# Patient Record
Sex: Female | Born: 1935 | Race: White | Hispanic: No | Smoking: Never smoker
Health system: Southern US, Community
[De-identification: ages and names within clinical notes are randomized; demographics above are authoritative.]

## PROBLEM LIST (undated history)

## (undated) DIAGNOSIS — G459 Transient cerebral ischemic attack, unspecified: Secondary | ICD-10-CM

## (undated) DIAGNOSIS — Z955 Presence of coronary angioplasty implant and graft: Secondary | ICD-10-CM

## (undated) DIAGNOSIS — K219 Gastro-esophageal reflux disease without esophagitis: Secondary | ICD-10-CM

## (undated) DIAGNOSIS — R55 Syncope and collapse: Secondary | ICD-10-CM

## (undated) DIAGNOSIS — I779 Disorder of arteries and arterioles, unspecified: Secondary | ICD-10-CM

## (undated) DIAGNOSIS — N1831 Chronic kidney disease, stage 3a: Secondary | ICD-10-CM

## (undated) DIAGNOSIS — I219 Acute myocardial infarction, unspecified: Secondary | ICD-10-CM

## (undated) DIAGNOSIS — I700 Atherosclerosis of aorta: Secondary | ICD-10-CM

## (undated) DIAGNOSIS — N183 Chronic kidney disease, stage 3 unspecified: Secondary | ICD-10-CM

## (undated) DIAGNOSIS — A0472 Enterocolitis due to Clostridium difficile, not specified as recurrent: Secondary | ICD-10-CM

## (undated) DIAGNOSIS — I440 Atrioventricular block, first degree: Secondary | ICD-10-CM

## (undated) DIAGNOSIS — Z8679 Personal history of other diseases of the circulatory system: Secondary | ICD-10-CM

## (undated) DIAGNOSIS — I2510 Atherosclerotic heart disease of native coronary artery without angina pectoris: Secondary | ICD-10-CM

## (undated) DIAGNOSIS — D649 Anemia, unspecified: Secondary | ICD-10-CM

## (undated) DIAGNOSIS — K5792 Diverticulitis of intestine, part unspecified, without perforation or abscess without bleeding: Secondary | ICD-10-CM

## (undated) DIAGNOSIS — F419 Anxiety disorder, unspecified: Secondary | ICD-10-CM

## (undated) DIAGNOSIS — E039 Hypothyroidism, unspecified: Secondary | ICD-10-CM

## (undated) DIAGNOSIS — R42 Dizziness and giddiness: Secondary | ICD-10-CM

## (undated) DIAGNOSIS — I447 Left bundle-branch block, unspecified: Secondary | ICD-10-CM

## (undated) DIAGNOSIS — Z974 Presence of external hearing-aid: Secondary | ICD-10-CM

## (undated) DIAGNOSIS — F32A Depression, unspecified: Secondary | ICD-10-CM

## (undated) DIAGNOSIS — E785 Hyperlipidemia, unspecified: Secondary | ICD-10-CM

## (undated) DIAGNOSIS — I10 Essential (primary) hypertension: Secondary | ICD-10-CM

## (undated) HISTORY — PX: REPLACEMENT TOTAL KNEE BILATERAL: SUR1225

## (undated) HISTORY — PX: KNEE ARTHROSCOPY: SUR90

## (undated) HISTORY — DX: Enterocolitis due to Clostridium difficile, not specified as recurrent: A04.72

## (undated) HISTORY — PX: SHOULDER SURGERY: SHX246

---

## 2004-06-09 ENCOUNTER — Other Ambulatory Visit: Payer: Self-pay

## 2004-08-13 ENCOUNTER — Ambulatory Visit: Payer: Self-pay | Admitting: Family Medicine

## 2004-09-08 ENCOUNTER — Ambulatory Visit: Payer: Self-pay | Admitting: Family Medicine

## 2005-03-02 ENCOUNTER — Encounter: Payer: Self-pay | Admitting: General Practice

## 2005-03-04 ENCOUNTER — Encounter: Payer: Self-pay | Admitting: General Practice

## 2005-06-01 ENCOUNTER — Inpatient Hospital Stay: Payer: Self-pay | Admitting: Internal Medicine

## 2005-06-01 ENCOUNTER — Other Ambulatory Visit: Payer: Self-pay

## 2005-11-30 ENCOUNTER — Ambulatory Visit: Payer: Self-pay | Admitting: Family Medicine

## 2006-12-02 ENCOUNTER — Ambulatory Visit: Payer: Self-pay | Admitting: Family Medicine

## 2007-01-26 ENCOUNTER — Ambulatory Visit: Payer: Self-pay | Admitting: Internal Medicine

## 2007-01-30 ENCOUNTER — Ambulatory Visit: Payer: Self-pay | Admitting: Gastroenterology

## 2007-08-07 ENCOUNTER — Ambulatory Visit: Payer: Self-pay | Admitting: Family Medicine

## 2007-10-10 ENCOUNTER — Ambulatory Visit: Payer: Self-pay | Admitting: Gastroenterology

## 2008-01-30 ENCOUNTER — Ambulatory Visit: Payer: Self-pay | Admitting: Family Medicine

## 2008-04-04 LAB — HM PAP SMEAR: HM Pap smear: NORMAL

## 2008-04-10 ENCOUNTER — Ambulatory Visit: Payer: Self-pay | Admitting: Family Medicine

## 2008-04-10 LAB — HM DEXA SCAN

## 2008-11-14 ENCOUNTER — Emergency Department: Payer: Self-pay | Admitting: Emergency Medicine

## 2008-12-04 ENCOUNTER — Ambulatory Visit: Payer: Self-pay | Admitting: Family Medicine

## 2009-04-03 ENCOUNTER — Ambulatory Visit: Payer: Self-pay | Admitting: Family Medicine

## 2009-11-15 ENCOUNTER — Ambulatory Visit: Payer: Self-pay | Admitting: General Practice

## 2009-12-26 ENCOUNTER — Ambulatory Visit: Payer: Self-pay | Admitting: General Practice

## 2010-03-03 ENCOUNTER — Emergency Department: Payer: Self-pay | Admitting: Emergency Medicine

## 2010-06-15 ENCOUNTER — Ambulatory Visit: Payer: Self-pay | Admitting: Family Medicine

## 2010-06-15 LAB — HM MAMMOGRAPHY: HM Mammogram: NORMAL

## 2010-11-06 ENCOUNTER — Ambulatory Visit: Payer: Self-pay | Admitting: Family Medicine

## 2010-12-03 ENCOUNTER — Ambulatory Visit: Payer: Self-pay | Admitting: Family Medicine

## 2011-03-25 ENCOUNTER — Ambulatory Visit: Payer: Self-pay | Admitting: Gastroenterology
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
## 2011-09-21 ENCOUNTER — Ambulatory Visit: Payer: Self-pay | Admitting: Family Medicine

## 2011-10-17 LAB — COMPREHENSIVE METABOLIC PANEL
Albumin: 4 g/dL (ref 3.4–5.0)
Alkaline Phosphatase: 110 U/L (ref 50–136)
BUN: 49 mg/dL — ABNORMAL HIGH (ref 7–18)
Calcium, Total: 9 mg/dL (ref 8.5–10.1)
Chloride: 101 mmol/L (ref 98–107)
Co2: 23 mmol/L (ref 21–32)
Osmolality: 293 (ref 275–301)
Potassium: 3 mmol/L — ABNORMAL LOW (ref 3.5–5.1)
SGOT(AST): 47 U/L — ABNORMAL HIGH (ref 15–37)
SGPT (ALT): 69 U/L
Total Protein: 7.4 g/dL (ref 6.4–8.2)

## 2011-10-17 LAB — CBC
HGB: 14.9 g/dL (ref 12.0–16.0)
MCH: 30.9 pg (ref 26.0–34.0)
MCHC: 33.6 g/dL (ref 32.0–36.0)
Platelet: 244 10*3/uL (ref 150–440)
RDW: 13 % (ref 11.5–14.5)

## 2011-10-17 LAB — LIPASE, BLOOD: Lipase: 86 U/L (ref 73–393)

## 2011-10-18 ENCOUNTER — Inpatient Hospital Stay: Payer: Self-pay | Admitting: Internal Medicine

## 2011-10-18 LAB — COMPREHENSIVE METABOLIC PANEL
Anion Gap: 11 (ref 7–16)
BUN: 40 mg/dL — ABNORMAL HIGH (ref 7–18)
Bilirubin,Total: 0.4 mg/dL (ref 0.2–1.0)
Creatinine: 1.36 mg/dL — ABNORMAL HIGH (ref 0.60–1.30)
EGFR (African American): 48 — ABNORMAL LOW
EGFR (Non-African Amer.): 40 — ABNORMAL LOW
Glucose: 93 mg/dL (ref 65–99)
SGPT (ALT): 54 U/L
Total Protein: 6.1 g/dL — ABNORMAL LOW (ref 6.4–8.2)

## 2011-10-18 LAB — OCCULT BLOOD X 1 CARD TO LAB, STOOL: Occult Blood, Feces: NEGATIVE

## 2011-10-18 LAB — URINALYSIS, COMPLETE
Bilirubin,UR: NEGATIVE
Ph: 5 (ref 4.5–8.0)
Protein: NEGATIVE
RBC,UR: 3 /HPF (ref 0–5)
Squamous Epithelial: 2
WBC UR: 37 /HPF (ref 0–5)

## 2011-10-19 LAB — COMPREHENSIVE METABOLIC PANEL
Alkaline Phosphatase: 75 U/L (ref 50–136)
Anion Gap: 13 (ref 7–16)
Bilirubin,Total: 0.3 mg/dL (ref 0.2–1.0)
Calcium, Total: 7.9 mg/dL — ABNORMAL LOW (ref 8.5–10.1)
Co2: 22 mmol/L (ref 21–32)
Creatinine: 1.06 mg/dL (ref 0.60–1.30)
EGFR (African American): >60
EGFR (Non-African Amer.): 53 — ABNORMAL LOW
Osmolality: 293 (ref 275–301)
SGOT(AST): 26 U/L (ref 15–37)
SGPT (ALT): 41 U/L
Sodium: 145 mmol/L (ref 136–145)
Total Protein: 5.6 g/dL — ABNORMAL LOW (ref 6.4–8.2)

## 2011-10-19 LAB — CBC WITH DIFFERENTIAL/PLATELET
Basophil %: 0.3 %
Eosinophil #: 0 10*3/uL (ref 0.0–0.7)
Eosinophil %: 0.7 %
HCT: 33.9 % — ABNORMAL LOW (ref 35.0–47.0)
HGB: 11.4 g/dL — ABNORMAL LOW (ref 12.0–16.0)
Lymphocyte #: 1.3 10*3/uL (ref 1.0–3.6)
Lymphocyte %: 28 %
MCH: 30.8 pg (ref 26.0–34.0)
MCV: 92 fL (ref 80–100)
Monocyte #: 0.2 10*3/uL (ref 0.0–0.7)
Neutrophil #: 3.1 10*3/uL (ref 1.4–6.5)
Neutrophil %: 67.3 %
Platelet: 166 10*3/uL (ref 150–440)
RBC: 3.7 10*6/uL — ABNORMAL LOW (ref 3.80–5.20)

## 2011-10-19 LAB — LIPID PANEL
Cholesterol: 120 mg/dL (ref 0–200)
HDL Cholesterol: 15 mg/dL — ABNORMAL LOW (ref 40–60)
Ldl Cholesterol, Calc: 75 mg/dL (ref 0–100)
Triglycerides: 152 mg/dL (ref 0–200)

## 2011-10-19 LAB — TSH: Thyroid Stimulating Horm: 1.4 u[IU]/mL

## 2011-10-19 LAB — HEMOGLOBIN A1C: Hemoglobin A1C: 5.5 % (ref 4.2–6.3)

## 2011-10-20 LAB — URINE CULTURE

## 2012-10-27 ENCOUNTER — Ambulatory Visit: Payer: Self-pay | Admitting: Family Medicine

## 2013-01-22 ENCOUNTER — Inpatient Hospital Stay: Payer: Self-pay | Admitting: Internal Medicine

## 2013-01-22 LAB — URINALYSIS, COMPLETE
Bacteria: NONE SEEN
Bilirubin,UR: NEGATIVE
Glucose,UR: NEGATIVE mg/dL (ref 0–75)
Ketone: NEGATIVE
Nitrite: NEGATIVE
Protein: NEGATIVE
RBC,UR: 6 /HPF (ref 0–5)
Squamous Epithelial: 8
WBC UR: 9 /HPF (ref 0–5)

## 2013-01-22 LAB — COMPREHENSIVE METABOLIC PANEL
Albumin: 2.8 g/dL — ABNORMAL LOW (ref 3.4–5.0)
Anion Gap: 8 (ref 7–16)
Calcium, Total: 8.7 mg/dL (ref 8.5–10.1)
Chloride: 97 mmol/L — ABNORMAL LOW (ref 98–107)
Co2: 27 mmol/L (ref 21–32)
EGFR (African American): 51 — ABNORMAL LOW
EGFR (Non-African Amer.): 44 — ABNORMAL LOW
Osmolality: 267 (ref 275–301)
Potassium: 2.7 mmol/L — ABNORMAL LOW (ref 3.5–5.1)
SGOT(AST): 28 U/L (ref 15–37)
SGPT (ALT): 31 U/L (ref 12–78)
Sodium: 132 mmol/L — ABNORMAL LOW (ref 136–145)

## 2013-01-22 LAB — CBC WITH DIFFERENTIAL/PLATELET
Eosinophil #: 0.2 10*3/uL (ref 0.0–0.7)
Eosinophil %: 0.8 %
HCT: 33.5 % — ABNORMAL LOW (ref 35.0–47.0)
HGB: 11.8 g/dL — ABNORMAL LOW (ref 12.0–16.0)
Lymphocyte #: 1.4 10*3/uL (ref 1.0–3.6)
Lymphocyte %: 7.5 %
MCH: 31.6 pg (ref 26.0–34.0)
MCV: 90 fL (ref 80–100)
Neutrophil %: 86.8 %
Platelet: 178 10*3/uL (ref 150–440)
RBC: 3.73 10*6/uL — ABNORMAL LOW (ref 3.80–5.20)
WBC: 19 10*3/uL — ABNORMAL HIGH (ref 3.6–11.0)

## 2013-01-23 LAB — CBC WITH DIFFERENTIAL/PLATELET
Basophil %: 0.2 %
Eosinophil %: 1.1 %
HGB: 11.8 g/dL — ABNORMAL LOW (ref 12.0–16.0)
Lymphocyte %: 8.7 %
MCH: 31.1 pg (ref 26.0–34.0)
MCHC: 34.4 g/dL (ref 32.0–36.0)
MCV: 90 fL (ref 80–100)
Neutrophil #: 15.1 10*3/uL — ABNORMAL HIGH (ref 1.4–6.5)
Neutrophil %: 84.4 %
Platelet: 181 10*3/uL (ref 150–440)
RBC: 3.8 10*6/uL (ref 3.80–5.20)
RDW: 14.7 % — ABNORMAL HIGH (ref 11.5–14.5)
WBC: 17.9 10*3/uL — ABNORMAL HIGH (ref 3.6–11.0)

## 2013-01-23 LAB — BASIC METABOLIC PANEL
Anion Gap: 5 — ABNORMAL LOW (ref 7–16)
BUN: 13 mg/dL (ref 7–18)
Calcium, Total: 8.5 mg/dL (ref 8.5–10.1)
Creatinine: 1.24 mg/dL (ref 0.60–1.30)
Glucose: 127 mg/dL — ABNORMAL HIGH (ref 65–99)
Osmolality: 266 (ref 275–301)
Potassium: 3 mmol/L — ABNORMAL LOW (ref 3.5–5.1)
Sodium: 132 mmol/L — ABNORMAL LOW (ref 136–145)

## 2013-01-24 LAB — BASIC METABOLIC PANEL
Anion Gap: 6 — ABNORMAL LOW (ref 7–16)
BUN: 10 mg/dL (ref 7–18)
Chloride: 105 mmol/L (ref 98–107)
Co2: 27 mmol/L (ref 21–32)
Creatinine: 0.92 mg/dL (ref 0.60–1.30)
EGFR (African American): >60
Potassium: 3.7 mmol/L (ref 3.5–5.1)
Sodium: 138 mmol/L (ref 136–145)

## 2013-01-24 LAB — CBC WITH DIFFERENTIAL/PLATELET
Basophil #: 0.1 10*3/uL (ref 0.0–0.1)
Basophil %: 0.9 %
Eosinophil #: 0.2 10*3/uL (ref 0.0–0.7)
Eosinophil %: 1.5 %
Lymphocyte #: 1.9 10*3/uL (ref 1.0–3.6)
Lymphocyte %: 11.6 %
MCH: 31.4 pg (ref 26.0–34.0)
MCV: 91 fL (ref 80–100)
Monocyte #: 0.6 x10 3/mm (ref 0.2–0.9)
Monocyte %: 3.7 %
Neutrophil #: 13.2 10*3/uL — ABNORMAL HIGH (ref 1.4–6.5)
Platelet: 212 10*3/uL (ref 150–440)
RBC: 3.91 10*6/uL (ref 3.80–5.20)
WBC: 16 10*3/uL — ABNORMAL HIGH (ref 3.6–11.0)

## 2013-01-24 LAB — URINE CULTURE

## 2013-01-25 LAB — CBC WITH DIFFERENTIAL/PLATELET
Basophil %: 1 %
Eosinophil %: 2.9 %
HCT: 35.8 % (ref 35.0–47.0)
HGB: 12 g/dL (ref 12.0–16.0)
Lymphocyte #: 1.8 10*3/uL (ref 1.0–3.6)
Lymphocyte %: 13.3 %
MCH: 30.6 pg (ref 26.0–34.0)
MCHC: 33.4 g/dL (ref 32.0–36.0)
Monocyte %: 4.6 %
Neutrophil #: 10.3 10*3/uL — ABNORMAL HIGH (ref 1.4–6.5)
Neutrophil %: 78.2 %
RBC: 3.91 10*6/uL (ref 3.80–5.20)
WBC: 13.2 10*3/uL — ABNORMAL HIGH (ref 3.6–11.0)

## 2013-01-28 LAB — CULTURE, BLOOD (SINGLE)

## 2013-02-21 ENCOUNTER — Ambulatory Visit: Payer: Self-pay | Admitting: Specialist

## 2013-12-26 ENCOUNTER — Ambulatory Visit: Payer: Self-pay | Admitting: Family Medicine

## 2014-04-09 IMAGING — CR DG CHEST 2V
1 series · 2 of 2 positions shown · non-contrast
Comparison: none

REASON FOR EXAM: cough
COMMENTS:

[Series 1: x chest ap · 0.14mm/px · 2 of 2 slices shown]
[im 1/2]
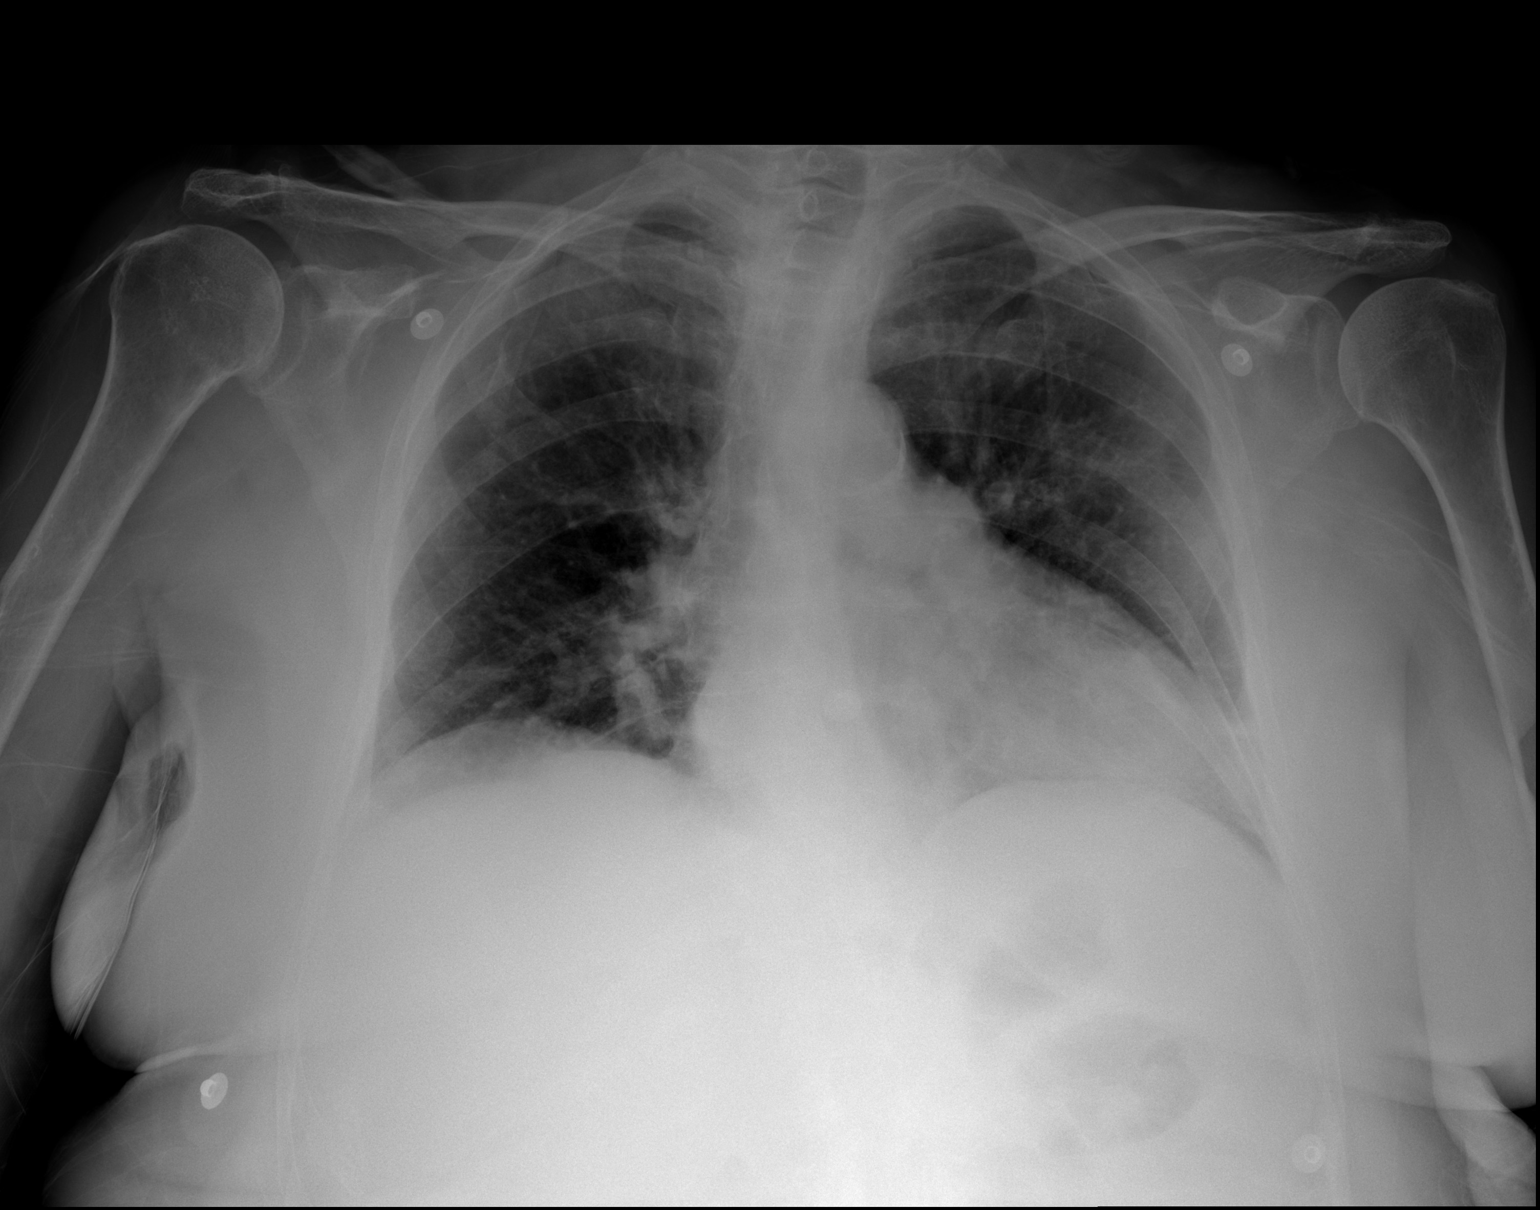
[im 2/2]
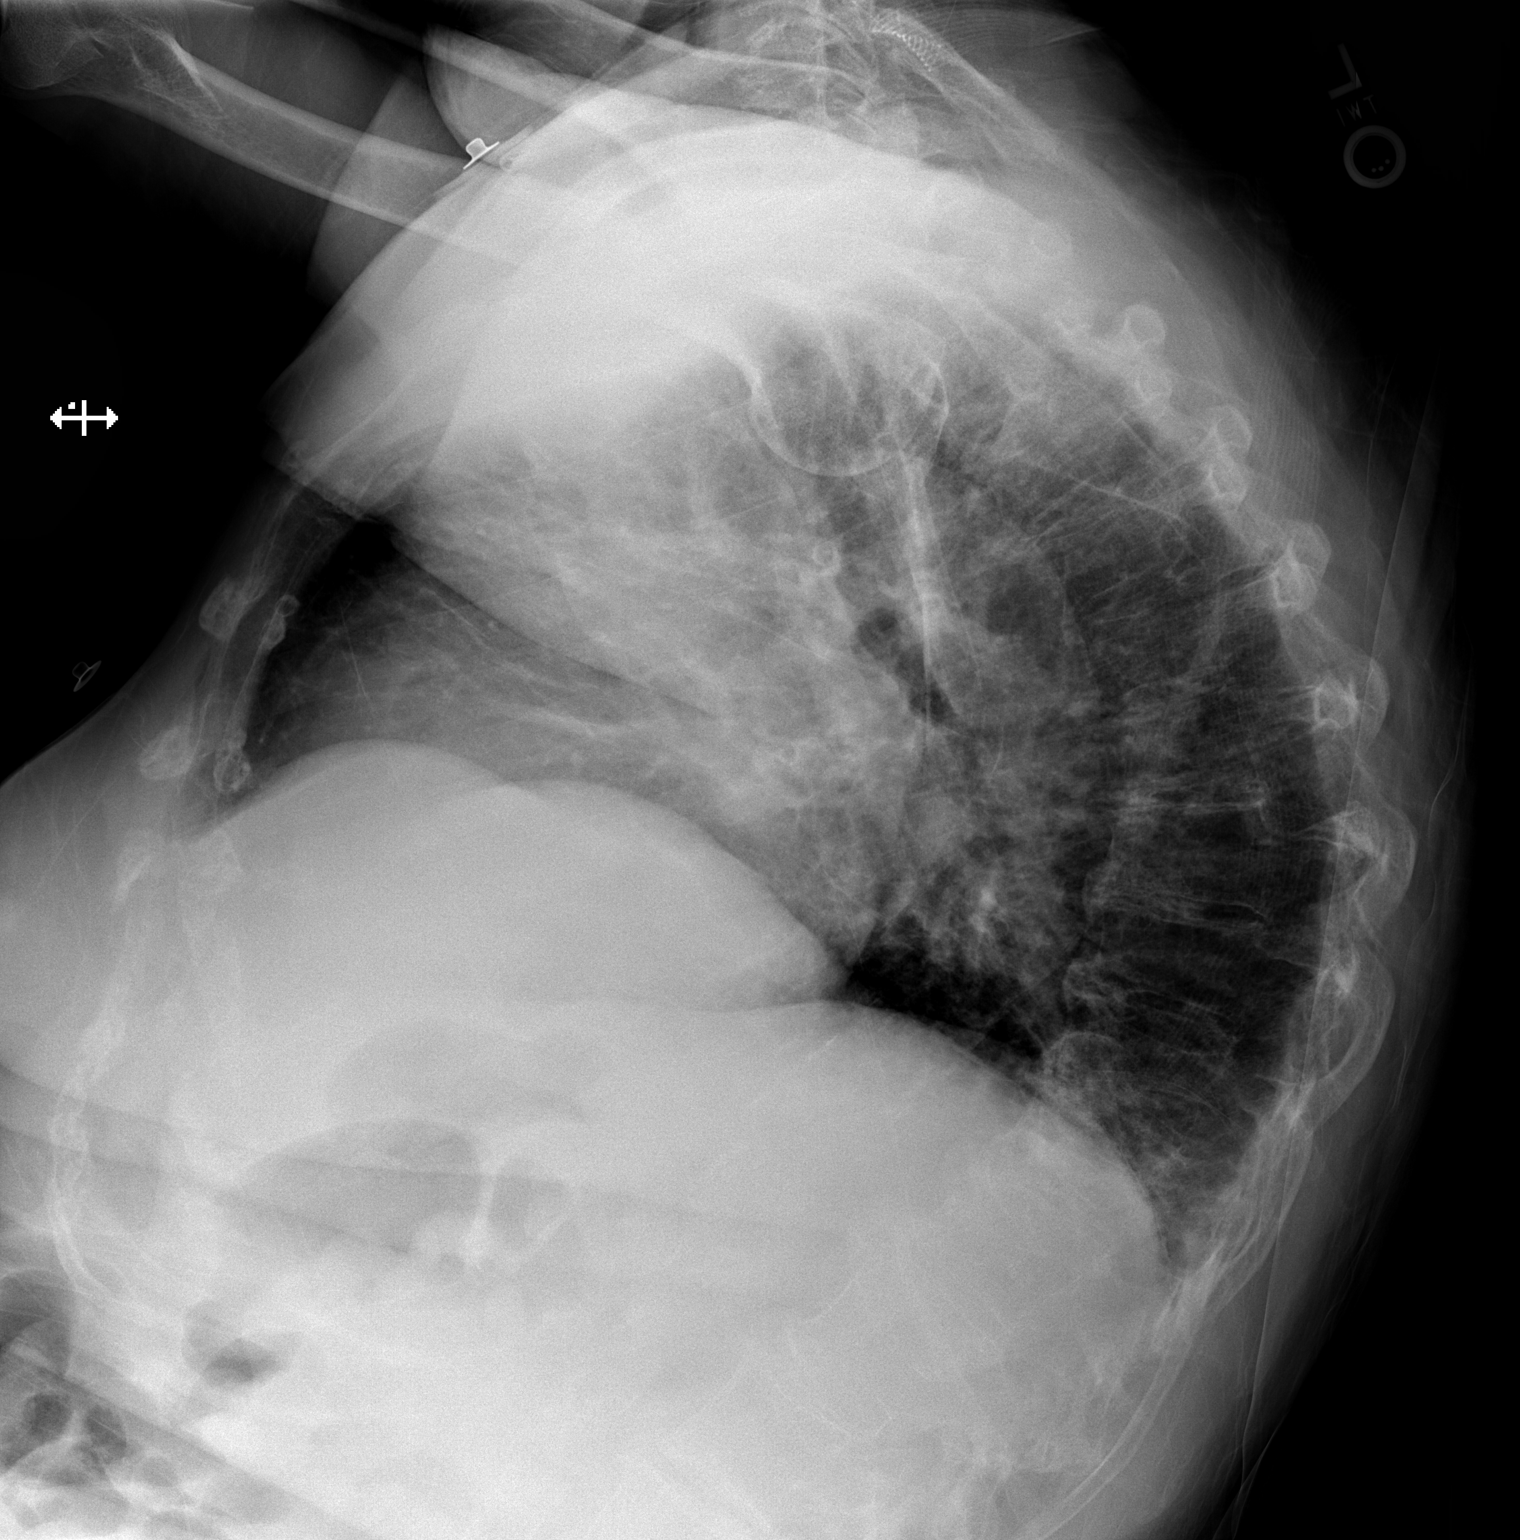

[2 of 2 positions shown; findings below may reference images not displayed]

PROCEDURE:     DXR - DXR CHEST PA (OR AP) AND LATERAL  - January 22, 2013 [DATE]

RESULT:     Comparison is made to prior study dated 10/27/2012.

The patient has taken a shallow inspiration. There is prominence of the
interstitial markings. No focal regions of consolidation or focal
infiltrates are appreciated.

The cardiac silhouette is moderately enlarged. The visualized bony skeleton
is unremarkable.
IMPRESSION: 1. Shallow inspiration.
2. Interstitial prominence which may represent an infectious or inflammatory
infiltrate. Surveillance evaluation status post appropriate regimen is
recommended.

## 2014-04-18 ENCOUNTER — Ambulatory Visit: Payer: Self-pay | Admitting: Gastroenterology

## 2014-04-18 LAB — HM COLONOSCOPY

## 2014-04-26 ENCOUNTER — Other Ambulatory Visit: Payer: Self-pay | Admitting: Urgent Care

## 2014-04-26 LAB — COMPREHENSIVE METABOLIC PANEL
ALK PHOS: 92 U/L
Albumin: 3.8 g/dL (ref 3.4–5.0)
Anion Gap: 7 (ref 7–16)
BUN: 19 mg/dL — AB (ref 7–18)
Bilirubin,Total: 0.5 mg/dL (ref 0.2–1.0)
CHLORIDE: 99 mmol/L (ref 98–107)
Calcium, Total: 8.8 mg/dL (ref 8.5–10.1)
Co2: 27 mmol/L (ref 21–32)
Creatinine: 1.2 mg/dL (ref 0.60–1.30)
EGFR (African American): 49 — ABNORMAL LOW
GFR CALC NON AF AMER: 42 — AB
Glucose: 89 mg/dL (ref 65–99)
Osmolality: 268 (ref 275–301)
Potassium: 3.9 mmol/L (ref 3.5–5.1)
SGOT(AST): 22 U/L (ref 15–37)
SGPT (ALT): 29 U/L
Sodium: 133 mmol/L — ABNORMAL LOW (ref 136–145)
Total Protein: 7.4 g/dL (ref 6.4–8.2)

## 2014-04-26 LAB — CBC WITH DIFFERENTIAL/PLATELET
BASOS ABS: 0.1 10*3/uL (ref 0.0–0.1)
Basophil %: 0.7 %
Eosinophil #: 0.2 10*3/uL (ref 0.0–0.7)
Eosinophil %: 1.9 %
HCT: 39 % (ref 35.0–47.0)
HGB: 13.6 g/dL (ref 12.0–16.0)
LYMPHS PCT: 30.4 %
Lymphocyte #: 2.5 10*3/uL (ref 1.0–3.6)
MCH: 31.2 pg (ref 26.0–34.0)
MCHC: 34.8 g/dL (ref 32.0–36.0)
MCV: 90 fL (ref 80–100)
MONO ABS: 0.6 x10 3/mm (ref 0.2–0.9)
MONOS PCT: 7.5 %
NEUTROS PCT: 59.5 %
Neutrophil #: 5 10*3/uL (ref 1.4–6.5)
Platelet: 169 10*3/uL (ref 150–440)
RBC: 4.36 10*6/uL (ref 3.80–5.20)
RDW: 14.3 % (ref 11.5–14.5)
WBC: 8.3 10*3/uL (ref 3.6–11.0)

## 2014-04-26 LAB — CLOSTRIDIUM DIFFICILE(ARMC)

## 2014-04-26 LAB — TSH: THYROID STIMULATING HORM: 6.77 u[IU]/mL — AB

## 2014-04-26 LAB — MAGNESIUM: MAGNESIUM: 1.8 mg/dL

## 2014-04-29 LAB — STOOL CULTURE

## 2014-07-12 DIAGNOSIS — I6529 Occlusion and stenosis of unspecified carotid artery: Secondary | ICD-10-CM | POA: Insufficient documentation

## 2014-10-04 DIAGNOSIS — I214 Non-ST elevation (NSTEMI) myocardial infarction: Secondary | ICD-10-CM

## 2014-10-04 HISTORY — DX: Non-ST elevation (NSTEMI) myocardial infarction: I21.4

## 2014-11-06 DIAGNOSIS — H40003 Preglaucoma, unspecified, bilateral: Secondary | ICD-10-CM | POA: Diagnosis not present

## 2014-11-08 DIAGNOSIS — H40003 Preglaucoma, unspecified, bilateral: Secondary | ICD-10-CM | POA: Diagnosis not present

## 2014-12-02 DIAGNOSIS — I872 Venous insufficiency (chronic) (peripheral): Secondary | ICD-10-CM | POA: Diagnosis not present

## 2014-12-02 DIAGNOSIS — I679 Cerebrovascular disease, unspecified: Secondary | ICD-10-CM | POA: Diagnosis not present

## 2014-12-02 DIAGNOSIS — I10 Essential (primary) hypertension: Secondary | ICD-10-CM | POA: Diagnosis not present

## 2014-12-02 DIAGNOSIS — K219 Gastro-esophageal reflux disease without esophagitis: Secondary | ICD-10-CM | POA: Diagnosis not present

## 2014-12-06 DIAGNOSIS — E780 Pure hypercholesterolemia: Secondary | ICD-10-CM | POA: Diagnosis not present

## 2014-12-06 DIAGNOSIS — I10 Essential (primary) hypertension: Secondary | ICD-10-CM | POA: Diagnosis not present

## 2014-12-06 LAB — LIPID PANEL
CHOLESTEROL: 204 mg/dL — AB (ref 0–200)
HDL: 31 mg/dL — AB (ref 35–70)
LDL Cholesterol: 125 mg/dL
LDl/HDL Ratio: 4
Triglycerides: 241 mg/dL — AB (ref 40–160)

## 2014-12-06 LAB — BASIC METABOLIC PANEL
BUN: 15 mg/dL (ref 4–21)
Creatinine: 1.1 mg/dL (ref <=1.1)
Glucose: 85 mg/dL
Potassium: 4.4 mmol/L (ref 3.4–5.3)
Sodium: 140 mmol/L (ref 137–147)

## 2014-12-06 LAB — CBC AND DIFFERENTIAL
HCT: 40 % (ref 36–46)
HEMOGLOBIN: 14 g/dL (ref 12.0–16.0)
Neutrophils Absolute: 55 /uL
Platelets: 180 10*3/uL (ref 150–399)
WBC: 7.9 10*3/mL

## 2014-12-06 LAB — HEPATIC FUNCTION PANEL
ALT: 19 U/L (ref 7–35)
AST: 25 U/L (ref 13–35)
Alkaline Phosphatase: 83 U/L (ref 25–125)
Bilirubin, Total: 0.4 mg/dL

## 2014-12-06 LAB — TSH: TSH: 0.84 u[IU]/mL (ref <=5.90)

## 2014-12-11 DIAGNOSIS — K551 Chronic vascular disorders of intestine: Secondary | ICD-10-CM | POA: Diagnosis not present

## 2014-12-20 DIAGNOSIS — I6521 Occlusion and stenosis of right carotid artery: Secondary | ICD-10-CM | POA: Diagnosis not present

## 2015-01-15 DIAGNOSIS — J301 Allergic rhinitis due to pollen: Secondary | ICD-10-CM | POA: Diagnosis not present

## 2015-01-15 DIAGNOSIS — H6063 Unspecified chronic otitis externa, bilateral: Secondary | ICD-10-CM | POA: Diagnosis not present

## 2015-01-15 DIAGNOSIS — H6123 Impacted cerumen, bilateral: Secondary | ICD-10-CM | POA: Diagnosis not present

## 2015-01-15 DIAGNOSIS — H1045 Other chronic allergic conjunctivitis: Secondary | ICD-10-CM | POA: Diagnosis not present

## 2015-01-24 NOTE — Discharge Summary (Signed)
PATIENT NAME:  Stephanie Charles, Stephanie Charles MR#:  [id number] DATE OF BIRTH:  1930-08-01  DATE OF ADMISSION:  01/22/2013 DATE OF DISCHARGE:  01/25/2013  PRIMARY CARE PHYSICIAN:  Miguel Aschoff, MD  DISCHARGE DIAGNOSES: 1.  Acute hypoxia.  2.  Respiratory failure due to pneumonia.  3.  Hypertension.   CONDITION: Stable.   CODE STATUS: Full code.   HOME MEDICATIONS: Please refer to the medication reconciliation list in [facility] discharge instruction.   DIET: Low sodium diet.   ACTIVITY: As tolerated.   FOLLOW-UP CARE: With PCP within 1 to 2 weeks. In addition, the patient needs make appointment with a pulmonary specialist for pulmonary function test 2 weeks after she recovers.    REASON FOR ADMISSION: Shortness of breath.   HOSPITAL COURSE: This is an 81 year old Caucasian female with a history of hypertension, hypothyroidism without depression, who presented to the ED with the shortness of breath. The patient was treated with Levaquin and sent home, but the patient came to the ED again due to weakness, lethargy and shortness of breath. The patient's oxygen saturation decreased to 81 to 84 on room air. In the ED, the patient received Levaquin and was admitted for pneumonia. For detailed history and physical examination, please refer to the admission note dictated by Dr. Vianne Bulls. On admission date, the patient's sodium 132, potassium 2.75, bicarbonate 27, BUN 16, creatinine 1.16, WBC 19, hemoglobin 11.8.   Chest x-ray showed left upper lobe, left lower lobe infiltrate. The patient was admitted for pneumonia. After admission, the patient's has been treated with Zithromax, Rocephin and with oxygen 2 liters and DuoNeb. The patient's symptoms have much improved after aforementioned treatment. The patient still has mild cough without shortness of breath. The patient's white count decreased to 13.2, sodium increased to 138, potassium 3.7, and blood culture negative and O2 saturation 95% without oxygen. The patient  is clinically stable and will be discharged to home today. I discussed the patient's discharge plan with the patient and case manager.   TIME SPENT: About 35 minutes   ____________________________ Demetrios Loll, MD qc:cc D: 01/25/2013 15:11:49 ET T: 01/25/2013 16:50:52 ET JOB#: [id number]  cc: Demetrios Loll, MD, <Dictator> Demetrios Loll MD ELECTRONICALLY SIGNED 01/26/2013 14:56

## 2015-01-24 NOTE — H&P (Signed)
PATIENT NAME:  Stephanie Charles, Stephanie Charles MR#:  [id number] DATE OF BIRTH:  09/24/1930  DATE OF ADMISSION:  01/22/2013  PRIMARY CARE PHYSICIAN:  Berna Bue, MD  ER PHYSICIAN:  Lenise Arena, MD  CHIEF COMPLAINT: Shortness of breath.   HISTORY OF PRESENT ILLNESS: This is an 81 year old female with history of hypertension, hypothyroidism, gout and depression who came because she was feeling short of breath. The patient was seen in urgent care yesterday because of cough, feeling weak and shortness of breath. She was treated with Levaquin and sent home. O2 sats were around 95% on room air in urgent care yesterday.  Today she went for followup to go to urgent care.  The patient still feels very weak and lethargic with shortness of breath. The patient was found to have O2 sats of 81 to 84% on room air so she was sent here. The patient received Rocephin in urgent care and also Levaquin; this morning she took.  The patient is going to be admitted for respiratory failure due to pneumonia. The patient complaint of not feeling well since this past Thursday with cough and shortness of breath and started to have fever yesterday so she went to urgent care. She took 2 doses of Levaquin and still feels wheezy and short of breath so she went to follow up at the urgent care.   PAST MEDICAL HISTORY: Significant for hypertension, gout, hypothyroidism, depression.   ALLERGIES:  CODEINE.   SOCIAL HISTORY: No smoking. No drinking. Lives with daughter.  PAST SURGICAL HISTORY:  None.  FAMILY HISTORY: Significant for mother who had a heart problem and her father also had a heart problem.  MEDICATIONS:  1.  Amlodipine 10 mg daily. 2.  Aspirin 81 mg daily. 3.  HCTZ 24 mg p.o. daily. 4.  Indomethacin 50 mg as needed. 5.  Levothyroxine 112 mcg p.o. daily. 6.  Losartan 100 mg p.o. daily.  7.  Mirtazapine 30 mg p.o. daily. 8.  Phenergan 25 mg once a day. 9.  Zoloft 50 mg p.o. daily. 10.  Tessalon Perles 100 mg p.o.   t.i.d.  REVIEW OF SYSTEMS:  CONSTITUTIONAL: Has fever, fatigue and weakness.  EYES: No blurred vision.  ENT: No tinnitus. No ear pain. No epistaxis.  RESPIRATORY: Has cough and wheezing, but denies any hemoptysis. CARDIOVASCULAR: No chest pain. No orthopnea. No PND. No pedal edema. No palpitations. No syncope.  GASTROINTESTINAL: No nausea, no vomiting and no abdominal pain, but complaints of poor appetite since yesterday.  ENDOCRINE: No polyuria or nocturia.  INTEGUMENTARY: No skin rash.  MUSCULOSKELETAL: The patient has no joint pain.  NEUROLOGIC: No numbness or weakness.  PSYCH: No anxiety or insomnia.   PHYSICAL EXAMINATION: VITAL SIGNS: Temperature 98.3, heart rate 66, blood pressure 150/67 and O2 saturation 81 to 84% on room air when she was at urgent care.  Here documented O2 sat 92% on room air. Right now she is on 2 liters with sats around 95 to 96 percent.  GENERAL:  The patient is alert, awake and oriented. Appears very weak and dehydrated.  HEAD:  Atraumatic, normocephalic. EYES:  Pupils equally reacting to light. Extraocular movements are intact.  ENT: No tympanic membrane congestion. No turbinate hypertrophy. No oral pharyngeal erythema. NECK: Normal range of motion. No JVD. No carotid bruit.  HEART:  S1 and S2 regular. No murmurs.  LUNGS: Bilateral expiratory wheeze in all lung fields.  ABDOMEN: Soft, nontender and nondistended. Bowel sounds present.  EXTREMITIES: No extremity edema. No clubbing or cyanosis. No  joint swelling. SKIN: No skin rashes.  NEUROLOGIC: Alert, awake and oriented. No focal neurological deficit.   LABORATORY AND DIAGNOSTICS: Electrolytes:  Sodium 132, potassium 2.7, chloride 97, bicarb 27, BUN 16, creatinine 1.16 and glucose 120. LFTs within normal limits. WBC 19, hemoglobin 11.8, hematocrit 33.5 and platelets 178.   Chest x-ray showed left upper lobe, left lower lobe infiltrate.  EKG:  Normal sinus rhythm, 73 beats per minute. No ST-T changes.    ASSESSMENT AND PLAN:  1.  An 81 year old female patient who failed outpatient for pneumonia came in with weakness and shortness of breath and found to have hypoxia on room air. Will be admitted to hospitalist service on telemetry for hypoxic respiratory failure secondary to pneumonia. The patient already received Levaquin and also Rocephin for 2 days so she can get a dose of Zithromax and then from tomorrow she can get Rocephin and Zithromax. Add oxygen 2 liters to keep sats above 95. Also add on DuoNebs q. 4 hours p.r.n. because she is also wheezing. Follow the white count and follow the blood cultures.  2.  Hypokalemia, likely secondary to dehydration and also the patient is taking HCTZ. Replace the potassium and recheck it and hold HCTZ for today.  3.  Hypertension.  Blood pressure is controlled.  Continue home medications, except HCTZ. 4.  Hypothyroidism. She is on Synthroid. Continue that.  5.  History of depression. The patient is on mirtazapine and Zoloft. Continue that.  6.  Benign essential tremors. Continue (bblocker 7.  History of gout. The patient can take indomethacin as needed.  8.  For cough we can use Tessalon Perles.  9. Gastrointestinal prophylaxis and deep vein thrombosis prophylaxis.   I discussed the plan with the patient's daughter.           CODE STATUS:  FULL CODE.  TIME SPENT: About 55 minutes.  ____________________________ Epifanio Lesches, MD sk:sb D: 01/22/2013 13:42:30 ET T: 01/22/2013 14:01:20 ET JOB#: [id number]  cc: Epifanio Lesches, MD, <Dictator> Epifanio Lesches MD ELECTRONICALLY SIGNED 02/12/2013 11:41

## 2015-01-26 NOTE — Discharge Summary (Signed)
PATIENT NAME:  Stephanie Charles, SCHMALE MR#:  [id number] DATE OF BIRTH:  Jul 13, 1930  DATE OF ADMISSION:  10/18/2011 DATE OF DISCHARGE:  10/19/2011  ADMISSION DIAGNOSIS: Nausea, vomiting, diarrhea.   DISCHARGE DIAGNOSES:  1. Nausea, vomiting, diarrhea.  2. Dehydration.  3. Acute renal failure, questionable acute pyelonephritis.  4. Gout exacerbation right hand finger. 5. History of elevated transaminases.  6. Liver steatosis on ultrasound.  7. Normal gallbladder.  8. Hypothyroidism.  9. Hypertension.   DISCHARGE CONDITION: Stable.   DISCHARGE MEDICATIONS: The patient is to resume her outpatient medications which are:  1. Norco 5/325 mg, 1 tablet every 6 hours as needed.  2. Amitriptyline 10 mg p.o. at bedtime as needed.  3. Aspirin 81 mg p.o. daily.  4. Azor 10/40 mg once daily.  5. Levothyroxine 112 mg p.o. daily.  6. Hydrochlorothiazide 25 mg p.o. daily.  7. Indomethacin 50 mg p.o. daily.  8. Propranolol 20 mg p.o. daily.   9. Mercaptopurine 50 mg, 2 tablets once daily.  10. Promethazine 25 mg every 4 hours as needed rectally.   ADDITIONAL MEDICATIONS:  1. Prednisone 40 mg p.o. daily for 3 days, then taper x10 mg daily until stopped.  2. Levaquin 500 mg p.o. daily for 6 more days to complete a 7-day course.  3. Omeprazole 40 mg p.o. daily.   DIET: 2 grams salt, mechanical soft, low residue. The patient is to advance to a regular diet over the next few days.   ACTIVITY LIMITATIONS: As tolerated.   FOLLOW-UP APPOINTMENT: Follow-up appointment with Dr. Rosanna Randy in 2 days after discharge.   CONSULTANTS: None.   LABORATORY, DIAGNOSTIC AND RADIOLOGICAL DATA:  Abdominal ultrasound, general survey, 10/19/2011 showed normal gallbladder, hepatic steatosis. Small hypoechoic focus along the gallbladder fossa may represent an area of focal fatty spreading, but it is indeterminate. Follow-up ultrasound is recommended to ensure stability. No hydronephrosis noted.  Her lab data on arrival to the  hospital on 10/17/2011 showed glucose of 108. BUN and creatinine were 49 and 1.88. Her potassium was 3.0. Otherwise unremarkable BMP.  The patient's estimated GFR for non-African American would be 27.  The patient's liver enzymes were remarkable for a slightly elevated AST to 47, otherwise unremarkable study.  The patient's troponin was less than 0.02.  CBC was within normal limits.  Urine cultures did not show any growth.  Urinalysis revealed yellow hazy urine, negative for glucose, bilirubin or ketones, specific gravity 1.016, pH 5.0, 1+ blood, negative for protein, nitrates, 2+ leukocyte esterase, 3 red blood cells, 37 white blood cells were noted, as well as 1+ bacteria and 2 epithelial cells, as well as 1 transitional epithelial cell was noted.  EKG showed sinus rhythm at 83 beats per minute, first-degree AV block, left ventricular hypertrophy as well as QRS  widening and repolarization  abnormality was noted. Inferior infarct, age undetermined, according to EKG criteria was also noted.   HISTORY AND PHYSICAL: The patient is a 81 year old Caucasian female with past medical history significant for history of diverticulitis, hypertension, gout, as well as hypothyroidism, presents to the hospital with complaints of nausea and vomiting as well as diarrhea. Apparently, the patient had developed gout exacerbation in the right hand middle finger, and she was seen by her family physician, Dr. Rosanna Randy, and was placed on colchicine as well as antibiotic and some pain medication. She had some  improvement in the gout, however, developed nausea and vomiting as well as diarrhea a few days later. She was not able to keep any  food down, and had persistent vomiting on the day of admission, and decided to come to the Emergency Room where she was found to be dehydrated and in acute renal failure and was admitted to the hospital.  HOSPITAL COURSE: The patient was admitted to the hospital. She was started on IV fluids,  was rehydrated.   In regards to nausea and vomiting, the patient's nausea and vomiting was treated with conservative measures and stopped. The patient was started on antibiotic therapy because of concern of urinary tract infection, questionable pyelonephritis; and as soon as she was started on antibiotics her nausea and vomiting, as well as diarrhea, completely stopped. We were not able even to get stool cultures for cultures as well as for C. difficile evaluation. We were, however, able to get a little smear of stool for occult blood in feces and that was negative. As the  antibiotics worked really magically on the patient's nausea and vomiting as well diarrhea, it was felt that the patient is to continue antibiotic therapy for 6 more days to complete a 7-day course. It was unclear why the patient had this acute gastroenteritis; however, it was felt it could have been bacterial or toxic. However, it could have been also related to medications such as colchicine. With conservative measures, the patient's acute renal failure also resolved. Initially, as mentioned above, the patient's creatinine was 1.88 on arrival to the hospital; however, by the day of discharge the patient's creatinine normalized. It is recommended to follow the patient's BUN and creatinine levels, especially if she does have recurrence of her nausea and vomiting.   Urinary tract infection: The patient was noted to have a urinary tract infection. Urine cultures were taken; however, they were still pending at the time of dictation. The patient improved clinically significantly, so it was felt that Levaquin was good medication for her to take. She is to continue this medication for 6 more days to complete a 7-day course.   In regards to gout exacerbation, the patient's colchicine was discontinued because of her nausea and vomiting as well as diarrhea. However, the patient was started on prednisone. She was given one dose IV Solu-Medrol, and  then she was started on 40 mg p.o. prednisone. She is to continue prednisone for 3 more days at a steady dose; however, she is to taper if her gout improves. She is to follow up with her primary care physician for further recommendations.   The patient was noted to have mildly elevated transaminases. It appeared, however, that she was seen by Dr. Dionne Milo in the past in his office as outpatient, however, work-up details were not known. Ultrasound of the abdomen was performed, and the patient was noted to have questionable hepatic steatosis. The patient was also noted to have normal gallbladder; however, a small hypoechoic focus in the gallbladder fossa was noted at that time. The radiologist felt that this could be just focal fatting spreading, however, that was indeterminate, and this is the reason ultrasound is recommended to ensure stability. No hydronephrosis was noted. The patient's liver enzymes were checked serially while she was in the hospital, and initially, as mentioned above, AST was slightly elevated; however, as time progressed the patient's AST normalized. In fact, on 10/19/2011, the patient's AST as well as ALT levels were within normal limits   In regards to hypothyroidism, the patient is to continue her outpatient medications.   For hypertension, the patient is to continue her outpatient medications. Initially, the patient's blood pressure medications  were placed on hold because of her dehydration as well as renal failure. However, her blood pressure medications should be restarted on the day of discharge.  The patient is to follow up with her primary care physician and advance medications as necessary.   DISCHARGE VITAL SIGNS: The patient's temperature is 97.3, pulse 61, respiration rate 21, blood pressure 159/69, saturation 95 to 97% on room air at rest.   TIME SPENT: 40 minutes.   ____________________________ Theodoro Grist, MD rv:cbb D: 10/19/2011 20:17:16 ET T: 10/20/2011  11:51:33 ET JOB#: [id number]  cc: Theodoro Grist, MD, <Dictator> Richard L. Rosanna Randy, MD Theodoro Grist MD ELECTRONICALLY SIGNED 11/14/2011 20:11

## 2015-01-26 NOTE — H&P (Signed)
PATIENT NAME:  Stephanie Charles, Stephanie Charles MR#:  [id number] DATE OF BIRTH:  Nov 07, 1929  DATE OF ADMISSION:  10/18/2011  EMERGENCY ROOM PHYSICIAN: Robb Matar, MD  CHIEF COMPLAINT: Nausea, vomiting, and diarrhea.   HISTORY OF PRESENT ILLNESS: The patient is a 81 year old female who presents with the chief complaint of nausea, vomiting, and diarrhea. Symptoms began one week ago. Last weekend the patient developed gout over the right third finger. She saw her family doctor for this one week ago and she was placed on colchicine, an antibiotic, and pain medication. She had some improvement in the gout. She developed nausea, vomiting, and diarrhea two days later. She has been unable to keep any food down. She had persistent vomiting today.   PAST MEDICAL HISTORY:  1. Diverticulitis. 2. Hypertension. 3. Gout. 4. Hypothyroidism.   ALLERGIES: Codeine.   CURRENT MEDICATIONS:  1. Amitriptyline 10 mg p.o. nightly p.r.n.  2. Aspirin 81 mg daily.  3. Azor 10 mg to 40 mg. 4. Hydrochlorothiazide 25 mg daily. 5. Indomethacin 50 mg p.o. daily. 6. Klor-Con 10 mg one p.o. twice a day. 7. Levothyroxine 112 mcg p.o. daily. 8. Mercaptopurine 50 mg two  p.o. daily.  9. Norco 5/325 mg one p.o. every six hours p.r.n.  10. Promethazine 25 mg every four hours p.r.n. for nausea and vomiting. 11. Propranolol 20 mg p.o. daily.  SOCIAL HISTORY: The patient denies tobacco abuse, alcohol abuse, or drug abuse. Her daughter lives with her.   FAMILY HISTORY: Negative for coronary artery disease, stroke, and diabetes.   REVIEW OF SYSTEMS: CONSTITUTIONAL: The patient denies any fevers, chills, or night sweats. HEENT: The patient denies any hearing loss, dysphagia, visual problems, or sore throat. CARDIOVASCULAR: The patient denies chest pain, orthopnea, or PND. RESPIRATORY: The patient denies any cough, wheezing, or hemoptysis. GI: The patient denies any abdominal pain, hematemesis, hematochezia, or melena. GENITOURINARY: The  patient denies any hematuria, dysuria, or frequency. NEUROLOGIC: The patient denies any headache, focal weakness, or seizures. SKIN: The patient denies any lesions or rash. ENDOCRINE: The patient denies polyuria, polyphagia, or polydipsia. MUSCULOSKELETAL: The patient denies any arthritis, joint effusion, or swelling. HEMATOLOGIC: The patient denies any easy bleeding or bruises.   PHYSICAL EXAMINATION:   VITALS:  Heart rate is 83, respiratory rate 18, blood pressure 164/75, and oxygen saturation 95%.   HEENT: Atraumatic, normocephalic. Pupils are equal, round, and reactive to light. Extraocular movements are intact. Sclerae anicteric. Mucous membranes are dry.   NECK: Supple. No organomegaly.   HEART: S1 and S2, regular rate and rhythm. No gallops. No thrills. No murmurs.   LUNGS: Clear to auscultation. No rales, no rhonchi, no wheezes, and no bronchial breath sounds.   GI: Abdomen is soft, nontender, and nondistended. Normal bowel sounds. No hepatosplenomegaly.   GU: No hematuria. No masses.   SKIN: No lesions. No rash.   ENDOCRINE: No masses. No thyromegaly.   LYMPH: No lymphadenopathy or nodes palpable.   NEUROLOGIC: Cranial nerves II through XII grossly intact. Motor strength is 5 out of 5 in bilateral upper and lower extremities. Sensation is within normal limits. No focal neurological deficits are noted on examination.  MUSCULOSKELETAL: There is gout over the right third finger. There is no arthritis or joint effusion.   EXTREMITIES: There is no cyanosis, no clubbing, and no edema. 2+ pedal pulses are noted bilaterally.  LABS/STUDIES: Glucose 108, BUN 49, creatinine 1.88, sodium 140, potassium 3.0, chloride 101, CO2 23, calcium was not measured, total bilirubin 0.4, alkaline phosphatase 110, ALT 69,  AST 47, total protein 7.4, and albumin 4.0. Estimated GFR 27. WBC count 8600, hemoglobin 14.9, hematocrit 44.2, and platelet count 244. Lipase 86. Troponin less than  0.02.  ASSESSMENT AND PLAN:  1. The patient is a 81 year old female who presents with the chief complaint of nausea, vomiting, and diarrhea. Admit to the medical floor and start IV hydration.  2. Acute renal failure due to prerenal azotemia. Colchicine, IV fluids, and monitor renal functions closely. 3. Hypothyroidism. Continue levothyroxine and check TSH  TOTAL TIME SPENT: 60 minutes. ____________________________ Tyrone Schimke, MD jsp:slb D: 10/18/2011 02:01:17 ET T: 10/18/2011 07:14:44 ET JOB#: [id number]  cc: Tyrone Schimke, MD, <Dictator> Tyrone Schimke MD ELECTRONICALLY SIGNED 10/18/2011 22:05

## 2015-02-14 DIAGNOSIS — L237 Allergic contact dermatitis due to plants, except food: Secondary | ICD-10-CM | POA: Diagnosis not present

## 2015-03-03 DIAGNOSIS — F329 Major depressive disorder, single episode, unspecified: Secondary | ICD-10-CM | POA: Diagnosis not present

## 2015-03-03 DIAGNOSIS — R531 Weakness: Secondary | ICD-10-CM | POA: Diagnosis not present

## 2015-03-03 DIAGNOSIS — G459 Transient cerebral ischemic attack, unspecified: Secondary | ICD-10-CM | POA: Diagnosis not present

## 2015-03-03 DIAGNOSIS — R269 Unspecified abnormalities of gait and mobility: Secondary | ICD-10-CM | POA: Diagnosis not present

## 2015-03-03 DIAGNOSIS — Z8673 Personal history of transient ischemic attack (TIA), and cerebral infarction without residual deficits: Secondary | ICD-10-CM | POA: Diagnosis not present

## 2015-03-03 DIAGNOSIS — I519 Heart disease, unspecified: Secondary | ICD-10-CM | POA: Diagnosis not present

## 2015-03-03 DIAGNOSIS — R2689 Other abnormalities of gait and mobility: Secondary | ICD-10-CM | POA: Diagnosis not present

## 2015-03-03 DIAGNOSIS — I10 Essential (primary) hypertension: Secondary | ICD-10-CM | POA: Diagnosis not present

## 2015-03-03 DIAGNOSIS — G252 Other specified forms of tremor: Secondary | ICD-10-CM | POA: Diagnosis not present

## 2015-03-03 DIAGNOSIS — I517 Cardiomegaly: Secondary | ICD-10-CM | POA: Diagnosis not present

## 2015-03-03 DIAGNOSIS — I639 Cerebral infarction, unspecified: Secondary | ICD-10-CM | POA: Diagnosis not present

## 2015-03-03 DIAGNOSIS — R918 Other nonspecific abnormal finding of lung field: Secondary | ICD-10-CM | POA: Diagnosis not present

## 2015-03-03 DIAGNOSIS — F339 Major depressive disorder, recurrent, unspecified: Secondary | ICD-10-CM | POA: Diagnosis not present

## 2015-03-03 DIAGNOSIS — E876 Hypokalemia: Secondary | ICD-10-CM | POA: Diagnosis not present

## 2015-03-03 DIAGNOSIS — I638 Other cerebral infarction: Secondary | ICD-10-CM | POA: Diagnosis not present

## 2015-03-03 DIAGNOSIS — N183 Chronic kidney disease, stage 3 (moderate): Secondary | ICD-10-CM | POA: Diagnosis not present

## 2015-03-03 DIAGNOSIS — R251 Tremor, unspecified: Secondary | ICD-10-CM | POA: Diagnosis not present

## 2015-03-03 DIAGNOSIS — Z7982 Long term (current) use of aspirin: Secondary | ICD-10-CM | POA: Diagnosis not present

## 2015-03-03 DIAGNOSIS — I371 Nonrheumatic pulmonary valve insufficiency: Secondary | ICD-10-CM | POA: Diagnosis not present

## 2015-03-03 DIAGNOSIS — I619 Nontraumatic intracerebral hemorrhage, unspecified: Secondary | ICD-10-CM | POA: Diagnosis not present

## 2015-03-03 DIAGNOSIS — E039 Hypothyroidism, unspecified: Secondary | ICD-10-CM | POA: Diagnosis not present

## 2015-03-06 DIAGNOSIS — M8588 Other specified disorders of bone density and structure, other site: Secondary | ICD-10-CM | POA: Diagnosis not present

## 2015-03-06 DIAGNOSIS — E039 Hypothyroidism, unspecified: Secondary | ICD-10-CM | POA: Diagnosis not present

## 2015-03-06 DIAGNOSIS — I700 Atherosclerosis of aorta: Secondary | ICD-10-CM | POA: Diagnosis not present

## 2015-03-06 DIAGNOSIS — M40204 Unspecified kyphosis, thoracic region: Secondary | ICD-10-CM | POA: Diagnosis not present

## 2015-03-06 DIAGNOSIS — I10 Essential (primary) hypertension: Secondary | ICD-10-CM | POA: Diagnosis not present

## 2015-03-06 DIAGNOSIS — R42 Dizziness and giddiness: Secondary | ICD-10-CM | POA: Diagnosis not present

## 2015-03-06 DIAGNOSIS — S80869A Insect bite (nonvenomous), unspecified lower leg, initial encounter: Secondary | ICD-10-CM | POA: Diagnosis not present

## 2015-03-06 DIAGNOSIS — I440 Atrioventricular block, first degree: Secondary | ICD-10-CM | POA: Diagnosis not present

## 2015-03-06 DIAGNOSIS — M47814 Spondylosis without myelopathy or radiculopathy, thoracic region: Secondary | ICD-10-CM | POA: Diagnosis not present

## 2015-03-06 DIAGNOSIS — R05 Cough: Secondary | ICD-10-CM | POA: Diagnosis not present

## 2015-03-06 DIAGNOSIS — R930 Abnormal findings on diagnostic imaging of skull and head, not elsewhere classified: Secondary | ICD-10-CM | POA: Diagnosis not present

## 2015-03-06 DIAGNOSIS — R262 Difficulty in walking, not elsewhere classified: Secondary | ICD-10-CM | POA: Diagnosis not present

## 2015-03-06 DIAGNOSIS — Z79899 Other long term (current) drug therapy: Secondary | ICD-10-CM | POA: Diagnosis not present

## 2015-03-06 DIAGNOSIS — Z8673 Personal history of transient ischemic attack (TIA), and cerebral infarction without residual deficits: Secondary | ICD-10-CM | POA: Diagnosis not present

## 2015-03-06 DIAGNOSIS — R269 Unspecified abnormalities of gait and mobility: Secondary | ICD-10-CM | POA: Diagnosis not present

## 2015-03-06 DIAGNOSIS — R9431 Abnormal electrocardiogram [ECG] [EKG]: Secondary | ICD-10-CM | POA: Diagnosis not present

## 2015-03-06 DIAGNOSIS — I517 Cardiomegaly: Secondary | ICD-10-CM | POA: Diagnosis not present

## 2015-03-06 DIAGNOSIS — E876 Hypokalemia: Secondary | ICD-10-CM | POA: Diagnosis not present

## 2015-03-10 ENCOUNTER — Other Ambulatory Visit: Payer: Self-pay | Admitting: Family Medicine

## 2015-03-10 NOTE — Telephone Encounter (Signed)
May refill for 1 year 

## 2015-03-10 NOTE — Telephone Encounter (Signed)
RXs refilled. Where can we put her in tomorrow? You are only scheduled for tomorrow afternoon. Please review. Thank you.

## 2015-03-10 NOTE — Telephone Encounter (Signed)
Dr. Rosanna Randy can see patient tomorrow on 6/7 at 1:30 as a work in. Tried calling a few times number busy.

## 2015-03-10 NOTE — Telephone Encounter (Signed)
Pt went to [facility] hospital 03/03/15 & 03/06/15. Pt stated she had a stroke and needs to see you tomorrow because she needs a hospital F/U and referral to the stroke clinic. Pt also stated she needs refills on Amlodipine and Sertraline [facility] [address]. Thanks TNP

## 2015-03-11 ENCOUNTER — Other Ambulatory Visit: Payer: Self-pay | Admitting: Family Medicine

## 2015-03-11 ENCOUNTER — Encounter: Payer: Self-pay | Admitting: Family Medicine

## 2015-03-11 ENCOUNTER — Ambulatory Visit (INDEPENDENT_AMBULATORY_CARE_PROVIDER_SITE_OTHER): Payer: Medicare Other | Admitting: Family Medicine

## 2015-03-11 VITALS — BP 156/48 | HR 96 | Temp 98.1°F | Resp 18 | Ht 62.0 in | Wt 167.0 lb

## 2015-03-11 DIAGNOSIS — I872 Venous insufficiency (chronic) (peripheral): Secondary | ICD-10-CM | POA: Insufficient documentation

## 2015-03-11 DIAGNOSIS — I679 Cerebrovascular disease, unspecified: Secondary | ICD-10-CM | POA: Insufficient documentation

## 2015-03-11 DIAGNOSIS — G939 Disorder of brain, unspecified: Secondary | ICD-10-CM | POA: Insufficient documentation

## 2015-03-11 DIAGNOSIS — T148 Other injury of unspecified body region: Secondary | ICD-10-CM

## 2015-03-11 DIAGNOSIS — G4700 Insomnia, unspecified: Secondary | ICD-10-CM | POA: Insufficient documentation

## 2015-03-11 DIAGNOSIS — K589 Irritable bowel syndrome without diarrhea: Secondary | ICD-10-CM | POA: Insufficient documentation

## 2015-03-11 DIAGNOSIS — I10 Essential (primary) hypertension: Secondary | ICD-10-CM

## 2015-03-11 DIAGNOSIS — R7309 Other abnormal glucose: Secondary | ICD-10-CM | POA: Insufficient documentation

## 2015-03-11 DIAGNOSIS — G459 Transient cerebral ischemic attack, unspecified: Secondary | ICD-10-CM | POA: Diagnosis not present

## 2015-03-11 DIAGNOSIS — W57XXXA Bitten or stung by nonvenomous insect and other nonvenomous arthropods, initial encounter: Secondary | ICD-10-CM | POA: Diagnosis not present

## 2015-03-11 DIAGNOSIS — J309 Allergic rhinitis, unspecified: Secondary | ICD-10-CM | POA: Insufficient documentation

## 2015-03-11 DIAGNOSIS — I6529 Occlusion and stenosis of unspecified carotid artery: Secondary | ICD-10-CM | POA: Insufficient documentation

## 2015-03-11 DIAGNOSIS — F32A Depression, unspecified: Secondary | ICD-10-CM | POA: Insufficient documentation

## 2015-03-11 DIAGNOSIS — I639 Cerebral infarction, unspecified: Secondary | ICD-10-CM

## 2015-03-11 DIAGNOSIS — R0989 Other specified symptoms and signs involving the circulatory and respiratory systems: Secondary | ICD-10-CM | POA: Insufficient documentation

## 2015-03-11 DIAGNOSIS — R251 Tremor, unspecified: Secondary | ICD-10-CM | POA: Diagnosis not present

## 2015-03-11 DIAGNOSIS — F329 Major depressive disorder, single episode, unspecified: Secondary | ICD-10-CM | POA: Insufficient documentation

## 2015-03-11 DIAGNOSIS — E785 Hyperlipidemia, unspecified: Secondary | ICD-10-CM | POA: Insufficient documentation

## 2015-03-11 DIAGNOSIS — I6782 Cerebral ischemia: Secondary | ICD-10-CM | POA: Insufficient documentation

## 2015-03-11 DIAGNOSIS — M129 Arthropathy, unspecified: Secondary | ICD-10-CM | POA: Insufficient documentation

## 2015-03-11 DIAGNOSIS — N399 Disorder of urinary system, unspecified: Secondary | ICD-10-CM | POA: Insufficient documentation

## 2015-03-11 DIAGNOSIS — G250 Essential tremor: Secondary | ICD-10-CM | POA: Insufficient documentation

## 2015-03-11 DIAGNOSIS — E039 Hypothyroidism, unspecified: Secondary | ICD-10-CM | POA: Insufficient documentation

## 2015-03-11 DIAGNOSIS — D649 Anemia, unspecified: Secondary | ICD-10-CM | POA: Insufficient documentation

## 2015-03-11 DIAGNOSIS — K219 Gastro-esophageal reflux disease without esophagitis: Secondary | ICD-10-CM | POA: Insufficient documentation

## 2015-03-11 MED ORDER — PROPRANOLOL HCL 20 MG PO TABS: 20.0000 mg | ORAL_TABLET | Freq: Two times a day (BID) | ORAL | Status: DC

## 2015-03-11 NOTE — Progress Notes (Signed)
Patient ID: Stephanie Charles, female   DOB: March 04, 1930, 81 y.o.   MRN: [id number]   NEZZIE MANERA  MRN: [id number] DOB: 1929-12-19  Subjective:  HPI   STROKE  Patient was admitted to [facility] for stroke on Monday Mar 03, 2015.  She was discharged on Wednesday March 05, 2015.  She then had another stroke on Thursday March 06, 2015 and was seen in the ER in [place].  She was observed that day and then discharged home.   During her first stroke she had weakness bilateral lower extremities with it worse on the left and she also effected on the left arm. On Thursday the stroke had affected only her legs.  Since her discharge she has not had any difficulty with swallowing, walking or talking.  She has not needed any assistants with any ADLS.  TICK BITE  Patient had tick bit 3 weeks ago.  While in the ER on Thursday March 06, 2015 she mentioned this to the physician and he started her on Doxycycline prophyllaxis.   Patient Active Problem List   Diagnosis Date Noted  . Abnormal blood sugar 03/11/2015  . Allergic rhinitis 03/11/2015  . Absolute anemia 03/11/2015  . Arthropathia 03/11/2015  . Benign essential HTN 03/11/2015  . Benign essential tremor 03/11/2015  . Artery disease, cerebral 03/11/2015  . Chronic venous insufficiency 03/11/2015  . Clinical depression 03/11/2015  . Urinary system disease 03/11/2015  . Acid reflux 03/11/2015  . HLD (hyperlipidemia) 03/11/2015  . Adult hypothyroidism 03/11/2015  . Irritable colon 03/11/2015  . Cannot sleep 03/11/2015  . Weak pulse 03/11/2015  . Carotid stenosis 03/11/2015  . Temporary cerebral vascular dysfunction 03/11/2015    No past medical history on file.  History   Social History  . Marital Status: Divorced    Spouse Name: N/A  . Number of Children: N/A  . Years of Education: N/A   Occupational History  . Not on file.   Social History Main Topics  . Smoking status: Never Smoker   . Smokeless tobacco: Not on file   . Alcohol Use: No  . Drug Use: No  . Sexual Activity: Not on file   Other Topics Concern  . Not on file   Social History Narrative    Outpatient Prescriptions Prior to Visit  Medication Sig Dispense Refill  . allopurinol (ZYLOPRIM) 300 MG tablet Take by mouth.    Marland Kitchen amitriptyline (ELAVIL) 10 MG tablet Take by mouth.    Marland Kitchen amLODipine (NORVASC) 5 MG tablet Take by mouth.    Marland Kitchen aspirin 81 MG tablet Take by mouth.    Marland Kitchen azelastine (OPTIVAR) 0.05 % ophthalmic solution Apply to eye.    Marland Kitchen Fluticasone-Salmeterol (ADVAIR) 100-50 MCG/DOSE AEPB Inhale into the lungs.    Marland Kitchen levothyroxine (SYNTHROID, LEVOTHROID) 137 MCG tablet Take by mouth.    . loratadine (CLARITIN) 10 MG tablet Take by mouth.    . losartan (COZAAR) 100 MG tablet Take by mouth.    . mirtazapine (REMERON) 30 MG tablet TAKE ONE TABLET BY MOUTH ONCE DAILY 30 tablet 2  . montelukast (SINGULAIR) 10 MG tablet Take by mouth.    . Probiotic CAPS Take by mouth.    . sertraline (ZOLOFT) 50 MG tablet TAKE ONE TABLET BY MOUTH IN THE EVENING 30 tablet 3   No facility-administered medications prior to visit.    Allergies  Allergen Reactions  . Codeine   . Levofloxacin     Mouth sores  . Simvastatin  Myalgias    Review of Systems  Constitutional: Negative.   HENT: Negative.   Eyes: Negative.   Respiratory: Negative.   Cardiovascular: Negative.   Gastrointestinal: Negative.   Genitourinary: Negative.   Musculoskeletal: Negative.   Skin: Negative.   Neurological: Positive for tingling (chin) and tremors.  Endo/Heme/Allergies: Negative.   Psychiatric/Behavioral: The patient is nervous/anxious (Over the thoughts of having another stroke.).    Objective:  BP 156/48 mmHg  Pulse 96  Temp(Src) 98.1 F (36.7 C) (Oral)  Resp 18  Ht 5\' 2"  (1.575 m)  Wt 167 lb (75.751 kg)  BMI 30.54 kg/m2  Physical Exam  Constitutional: She is oriented to person, place, and time and well-developed, well-nourished, and in no distress.  HENT:   Head: Normocephalic and atraumatic.  Eyes: Conjunctivae are normal. Pupils are equal, round, and reactive to light.  Neck: Normal range of motion. Neck supple.  Cardiovascular: Normal rate, regular rhythm and normal heart sounds.   Pulmonary/Chest: Effort normal and breath sounds normal.  Musculoskeletal: Normal range of motion.  Neurological: She is alert and oriented to person, place, and time. She is not agitated and not disoriented. She displays no weakness, no tremor, facial symmetry, normal stance and normal speech. No cranial nerve deficit or sensory deficit. She exhibits normal muscle tone. She has a normal Cerebellar Exam and a normal Romberg Test. She shows no pronator drift. Gait normal. Gait normal.  Skin: Skin is warm and dry.  Psychiatric: Mood, memory, affect and judgment normal.  Very mild chronic left arm/hand tremor.  Assessment and Plan :  Stroke/TIA Clinically I think the patient had a TIA and not a stroke. She had complete workup and all risk factors are treated. Discussed this with patient and her daughter. I think her chronic anxiety is a major contributor to these issues and patient and her daughter both agree.  Miguel Aschoff MD [facility] 03/11/2015 2:10 PM

## 2015-03-12 LAB — RENAL FUNCTION PANEL
Albumin: 4.6 g/dL (ref 3.5–4.7)
BUN/Creatinine Ratio: 19 (ref 11–26)
BUN: 19 mg/dL (ref 8–27)
CO2: 28 mmol/L (ref 18–29)
Calcium: 9.8 mg/dL (ref 8.7–10.3)
Chloride: 98 mmol/L (ref 97–108)
Creatinine, Ser: 1.01 mg/dL — ABNORMAL HIGH (ref 0.57–1.00)
GFR calc Af Amer: 60 mL/min/{1.73_m2} (ref >59)
GFR calc non Af Amer: 52 mL/min/{1.73_m2} — ABNORMAL LOW (ref >59)
Glucose: 112 mg/dL — ABNORMAL HIGH (ref 65–99)
Phosphorus: 3.6 mg/dL (ref 2.5–4.5)
Potassium: 3.7 mmol/L (ref 3.5–5.2)
Sodium: 143 mmol/L (ref 134–144)

## 2015-03-16 ENCOUNTER — Encounter: Payer: Self-pay | Admitting: Family Medicine

## 2015-03-18 ENCOUNTER — Telehealth: Payer: Self-pay | Admitting: Family Medicine

## 2015-03-18 DIAGNOSIS — F329 Major depressive disorder, single episode, unspecified: Secondary | ICD-10-CM

## 2015-03-18 DIAGNOSIS — F32A Depression, unspecified: Secondary | ICD-10-CM

## 2015-03-18 NOTE — Telephone Encounter (Signed)
sertraline (ZOLOFT) 50 MG---Pt states that at last office visit this dosage was increased to 100 mg but 50 mg was sent in pharmancy.Please advise pt

## 2015-03-19 MED ORDER — SERTRALINE HCL 100 MG PO TABS: 100.0000 mg | ORAL_TABLET | Freq: Every day | ORAL | Status: DC

## 2015-03-19 NOTE — Telephone Encounter (Signed)
Okay to send then 100 mg daily. Thank you.

## 2015-03-24 DIAGNOSIS — Z885 Allergy status to narcotic agent status: Secondary | ICD-10-CM | POA: Diagnosis not present

## 2015-03-24 DIAGNOSIS — I10 Essential (primary) hypertension: Secondary | ICD-10-CM | POA: Diagnosis not present

## 2015-03-24 DIAGNOSIS — Z803 Family history of malignant neoplasm of breast: Secondary | ICD-10-CM | POA: Diagnosis not present

## 2015-03-24 DIAGNOSIS — R001 Bradycardia, unspecified: Secondary | ICD-10-CM | POA: Diagnosis not present

## 2015-03-24 DIAGNOSIS — Z79899 Other long term (current) drug therapy: Secondary | ICD-10-CM | POA: Diagnosis not present

## 2015-03-24 DIAGNOSIS — Z823 Family history of stroke: Secondary | ICD-10-CM | POA: Diagnosis not present

## 2015-03-24 DIAGNOSIS — R9082 White matter disease, unspecified: Secondary | ICD-10-CM | POA: Diagnosis not present

## 2015-03-24 DIAGNOSIS — E039 Hypothyroidism, unspecified: Secondary | ICD-10-CM | POA: Diagnosis not present

## 2015-03-24 DIAGNOSIS — Z820 Family history of epilepsy and other diseases of the nervous system: Secondary | ICD-10-CM | POA: Diagnosis not present

## 2015-03-24 DIAGNOSIS — R42 Dizziness and giddiness: Secondary | ICD-10-CM | POA: Diagnosis not present

## 2015-03-24 DIAGNOSIS — Z7982 Long term (current) use of aspirin: Secondary | ICD-10-CM | POA: Diagnosis not present

## 2015-03-24 DIAGNOSIS — F329 Major depressive disorder, single episode, unspecified: Secondary | ICD-10-CM | POA: Diagnosis not present

## 2015-03-24 DIAGNOSIS — I6529 Occlusion and stenosis of unspecified carotid artery: Secondary | ICD-10-CM | POA: Diagnosis not present

## 2015-03-24 DIAGNOSIS — Z881 Allergy status to other antibiotic agents status: Secondary | ICD-10-CM | POA: Diagnosis not present

## 2015-03-24 DIAGNOSIS — I517 Cardiomegaly: Secondary | ICD-10-CM | POA: Diagnosis not present

## 2015-03-24 DIAGNOSIS — Z8673 Personal history of transient ischemic attack (TIA), and cerebral infarction without residual deficits: Secondary | ICD-10-CM | POA: Diagnosis not present

## 2015-03-24 DIAGNOSIS — Z8249 Family history of ischemic heart disease and other diseases of the circulatory system: Secondary | ICD-10-CM | POA: Diagnosis not present

## 2015-03-25 ENCOUNTER — Encounter: Payer: Self-pay | Admitting: Family Medicine

## 2015-03-25 ENCOUNTER — Ambulatory Visit (INDEPENDENT_AMBULATORY_CARE_PROVIDER_SITE_OTHER): Payer: Medicare Other | Admitting: Family Medicine

## 2015-03-25 VITALS — BP 182/66 | HR 60 | Temp 97.8°F | Resp 14 | Wt 164.0 lb

## 2015-03-25 DIAGNOSIS — I10 Essential (primary) hypertension: Secondary | ICD-10-CM

## 2015-03-25 DIAGNOSIS — I6529 Occlusion and stenosis of unspecified carotid artery: Secondary | ICD-10-CM | POA: Diagnosis not present

## 2015-03-25 DIAGNOSIS — Z09 Encounter for follow-up examination after completed treatment for conditions other than malignant neoplasm: Secondary | ICD-10-CM

## 2015-03-25 DIAGNOSIS — F419 Anxiety disorder, unspecified: Secondary | ICD-10-CM

## 2015-03-25 DIAGNOSIS — R251 Tremor, unspecified: Secondary | ICD-10-CM | POA: Diagnosis not present

## 2015-03-25 MED ORDER — HYDRALAZINE HCL 25 MG PO TABS: 25.0000 mg | ORAL_TABLET | Freq: Two times a day (BID) | ORAL | Status: DC

## 2015-03-25 MED ORDER — ALPRAZOLAM 0.25 MG PO TABS: 0.2500 mg | ORAL_TABLET | Freq: Three times a day (TID) | ORAL | Status: DC | PRN

## 2015-03-25 MED ORDER — PROPRANOLOL HCL 20 MG PO TABS: 20.0000 mg | ORAL_TABLET | Freq: Two times a day (BID) | ORAL | Status: DC

## 2015-03-25 NOTE — Progress Notes (Signed)
Patient ID: Stephanie Charles, female   DOB: 05-May-1930, 81 y.o.   MRN: [id number]    Subjective:  Hypertension Associated symptoms include malaise/fatigue and palpitations (sometimes feels like her heart bits hard). Pertinent negatives include no chest pain, neck pain or shortness of breath.  Patient is here to follow up after ER visit at [facility] yesterday 03/24/15. She went in due to dizziness, felt unsteady on her feet and having a high B/P. She was given Meclizine and Hydralazine there which helped some her B/P. When she left yesterday her B/P was 172/67. EKG, CT of the head and labs were done and that was stable per notes from [facility]. No new medications have been added at her discharge. She was advised to follow up with [name] and to make sure to follow up with neurologist in regards to the TIAs she has had. Patient does mention that the doctor in ER was concerned with her pulse been lower it was 57. She was advised to keep an eye on this. Patient is not sure why her B/P stays high, thinks maybe she is anxious. Her Sertraline was increased last time to 100 mg about 3 weeks ago. Patient has had no recurrence of any TIA symptoms. Her tremor is improved on propanolol. Anxiety is not improved or worsen.  Prior to Admission medications   Medication Sig Start Date End Date Taking? Authorizing Provider  allopurinol (ZYLOPRIM) 300 MG tablet Take by mouth. 07/06/14   Historical Provider, MD  amitriptyline (ELAVIL) 10 MG tablet Take by mouth. 01/12/15   Historical Provider, MD  amlodipine (NORVASC) 5 MG tablet Take by mouth. 04/12/14   Historical Provider, MD  aspirin 81 MG tablet Take by mouth. 10/26/11   Historical Provider, MD  azelastine (OPTIVAR) 0.05 % ophthalmic solution Apply to eye. 02/14/15   Historical Provider, MD  clopidogrel (PLAVIX) 75 MG tablet Take 75 mg by mouth daily.    Historical Provider, MD  Fluticasone-Salmeterol (ADVAIR) 100-50 MCG/DOSE AEPB Inhale into the lungs. 10/01/14   Historical Provider, MD   levothyroxine (SYNTHROID, LEVOTHROID) 137 MCG tablet Take by mouth. 12/04/14   Historical Provider, MD  loratadine (CLARITIN) 10 MG tablet Take by mouth. 01/26/14   Historical Provider, MD  losartan (COZAAR) 100 MG tablet Take by mouth. 01/31/15   Historical Provider, MD  mirtazapine (REMERON) 30 MG tablet TAKE ONE TABLET BY MOUTH ONCE DAILY 03/10/15   Jerrol Banana., MD  montelukast (SINGULAIR) 10 MG tablet Take by mouth. 01/26/14   Historical Provider, MD  Probiotic CAPS Take by mouth.    Historical Provider, MD  propranolol (INDERAL) 20 MG tablet Take 1 tablet (20 mg total) by mouth 2 (two) times daily. 03/11/15   Richard Maceo Pro., MD  sertraline (ZOLOFT) 100 MG tablet Take 1 tablet (100 mg total) by mouth daily. 03/19/15   Richard Maceo Pro., MD    Patient Active Problem List   Diagnosis Date Noted  . Abnormal blood sugar 03/11/2015  . Allergic rhinitis 03/11/2015  . Absolute anemia 03/11/2015  . Arthropathia 03/11/2015  . Benign essential HTN 03/11/2015  . Benign essential tremor 03/11/2015  . Artery disease, cerebral 03/11/2015  . Chronic venous insufficiency 03/11/2015  . Clinical depression 03/11/2015  . Urinary system disease 03/11/2015  . Acid reflux 03/11/2015  . HLD (hyperlipidemia) 03/11/2015  . Adult hypothyroidism 03/11/2015  . Irritable colon 03/11/2015  . Cannot sleep 03/11/2015  . Weak pulse 03/11/2015  . Carotid stenosis 03/11/2015  . Temporary cerebral vascular dysfunction  03/11/2015  . External carotid artery stenosis 07/12/2014    No past medical history on file.  History   Social History  . Marital Status: Divorced    Spouse Name: na  . Number of Children: 4  . Years of Education: 14   Occupational History  . retired    Social History Main Topics  . Smoking status: Never Smoker   . Smokeless tobacco: Never Used  . Alcohol Use: No  . Drug Use: No  . Sexual Activity: No   Other Topics Concern  . Not on file   Social History Narrative     Allergies  Allergen Reactions  . Codeine   . Levofloxacin     Mouth sores  . Simvastatin     Myalgias    Review of Systems  Constitutional: Positive for malaise/fatigue. Negative for fever and chills.  Respiratory: Negative for cough, shortness of breath and wheezing.   Cardiovascular: Positive for palpitations (sometimes feels like her heart bits hard). Negative for chest pain and leg swelling.  Gastrointestinal: Negative for nausea and vomiting.  Musculoskeletal: Negative for back pain, joint pain and neck pain.  Neurological: Positive for dizziness and weakness.    Immunization History  Administered Date(s) Administered  . Pneumococcal Polysaccharide-23 06/20/2013  . Tdap 02/23/2012  . Zoster 08/03/2011   Objective:  BP 182/66 mmHg  Pulse 60  Temp(Src) 97.8 F (36.6 C)  Resp 14  Wt 164 lb (74.39 kg)  Physical Exam  Constitutional: She is oriented to person, place, and time and well-developed, well-nourished, and in no distress. No distress.  Eyes: Conjunctivae are normal. Pupils are equal, round, and reactive to light.  Neck: Normal range of motion.  Cardiovascular: Normal rate, regular rhythm and normal heart sounds.   No murmur heard. Pulmonary/Chest: Effort normal and breath sounds normal. She has no wheezes. She exhibits no tenderness.  Musculoskeletal: She exhibits no edema.  Neurological: She is alert and oriented to person, place, and time. Gait normal.  Psychiatric: Affect and judgment normal.    Lab Results  Component Value Date   WBC 7.9 12/06/2014   HGB 14.0 12/06/2014   HCT 40 12/06/2014   PLT 180 12/06/2014   GLUCOSE 112* 03/11/2015   CHOL 204* 12/06/2014   TRIG 241* 12/06/2014   HDL 31* 12/06/2014   LDLCALC 125 12/06/2014   TSH 0.84 12/06/2014    CMP     Component Value Date/Time   NA 143 03/11/2015 1446   NA 133* 04/26/2014 1141   K 3.7 03/11/2015 1446   K 3.9 04/26/2014 1141   CL 98 03/11/2015 1446   CL 99 04/26/2014 1141    CO2 28 03/11/2015 1446   CO2 27 04/26/2014 1141   GLUCOSE 112* 03/11/2015 1446   GLUCOSE 89 04/26/2014 1141   BUN 19 03/11/2015 1446   BUN 19* 04/26/2014 1141   CREATININE 1.01* 03/11/2015 1446   CREATININE 1.1 12/06/2014   CREATININE 1.20 04/26/2014 1141   CALCIUM 9.8 03/11/2015 1446   CALCIUM 8.8 04/26/2014 1141   PROT 7.4 04/26/2014 1141   ALBUMIN 3.8 04/26/2014 1141   AST 25 12/06/2014   AST 22 04/26/2014 1141   ALT 19 12/06/2014   ALT 29 04/26/2014 1141   ALKPHOS 83 12/06/2014   ALKPHOS 92 04/26/2014 1141   GFRNONAA 52* 03/11/2015 1446   GFRNONAA 42* 04/26/2014 1141   GFRAA 60 03/11/2015 1446   GFRAA 49* 04/26/2014 1141    Assessment and Plan :  1. Hospital discharge follow-up  Records reviewed.  2. Essential hypertension Elevated today. Will add Hydralazine 25 mg BID and then re access on the next visit may need to increase to TID. This could be related to anxiety also. Re check in 2 weeks.  3. Tremor Stable. Improved on propanolol. May have to stop this, however, due to borderline bradycardia.  4. External carotid artery stenosis, unspecified laterality  5. Anxiety. Start Xanax as needed and re check on the next visit.  6. TIA Try to lower blood pressure slowly.  Patient was seen and examined by Dr. Eulas Post and note was scribed by Theressa Millard, RMA.     Miguel Aschoff MD [facility] 03/25/2015 8:58 AM

## 2015-03-27 ENCOUNTER — Telehealth: Payer: Self-pay | Admitting: Family Medicine

## 2015-03-27 NOTE — Telephone Encounter (Signed)
Pt would like to verify that she is taking her new medication correctly because she thought Dr. Rosanna Randy told her to take   hydrALAZINE (APRESOLINE) 25 MG tablet      Three times a day but when she picked up the medication the directions are to take medication twice a day. Pt wanted to make sure which directions were correct. Please advise. Thanks TNP

## 2015-03-27 NOTE — Telephone Encounter (Signed)
Pt advised to take BID on the follow up visit may need to increase to TID. Patient Stephanie Charles

## 2015-04-04 ENCOUNTER — Other Ambulatory Visit: Payer: Self-pay

## 2015-04-04 MED ORDER — HYDROCHLOROTHIAZIDE 25 MG PO TABS: 25.0000 mg | ORAL_TABLET | Freq: Every day | ORAL | Status: DC

## 2015-04-16 ENCOUNTER — Ambulatory Visit (INDEPENDENT_AMBULATORY_CARE_PROVIDER_SITE_OTHER): Payer: Medicare Other | Admitting: Family Medicine

## 2015-04-16 ENCOUNTER — Encounter: Payer: Self-pay | Admitting: Family Medicine

## 2015-04-16 VITALS — BP 164/56 | HR 84 | Temp 98.1°F | Resp 16 | Wt 167.0 lb

## 2015-04-16 DIAGNOSIS — F419 Anxiety disorder, unspecified: Secondary | ICD-10-CM | POA: Diagnosis not present

## 2015-04-16 DIAGNOSIS — I10 Essential (primary) hypertension: Secondary | ICD-10-CM

## 2015-04-16 MED ORDER — HYDRALAZINE HCL 50 MG PO TABS: 50.0000 mg | ORAL_TABLET | Freq: Three times a day (TID) | ORAL | Status: DC

## 2015-04-16 NOTE — Progress Notes (Signed)
Patient ID: Stephanie Charles, female   DOB: 1930-03-08, 81 y.o.   MRN: [id number]   Stephanie Charles  MRN: [id number] DOB: [birth year]  Subjective:  HPI  1. Essential hypertension Patient is an 81 year old female who presents for follow up oh her hypertension.  Her last visit was on 03/25/15.  Her blood pressure at that time was 182/66.  Management changes during that visit include adding Hydralazine to her regiment.  Patient reports good compliance and tolerance to the medication.  2. Acute anxiety Patient was seen last month for her anxiety.  Management changes include adding Xanax to her regiment.  The patient reports she is not taking it every day and she does not take it during the day.  She is cautious with the medication because she noted that it does make her sleepy.  She states when she takes it, it is at night and she does go to bed right after taking it.  The patient reports that when she has taken the Xanax she has not really noticed that it helped her to sleep or relax any more than normal.   Patient Active Problem List   Diagnosis Date Noted  . Abnormal blood sugar 03/11/2015  . Allergic rhinitis 03/11/2015  . Absolute anemia 03/11/2015  . Arthropathia 03/11/2015  . Benign essential HTN 03/11/2015  . Benign essential tremor 03/11/2015  . Artery disease, cerebral 03/11/2015  . Chronic venous insufficiency 03/11/2015  . Clinical depression 03/11/2015  . Urinary system disease 03/11/2015  . Acid reflux 03/11/2015  . HLD (hyperlipidemia) 03/11/2015  . Adult hypothyroidism 03/11/2015  . Irritable colon 03/11/2015  . Cannot sleep 03/11/2015  . Weak pulse 03/11/2015  . Carotid stenosis 03/11/2015  . Temporary cerebral vascular dysfunction 03/11/2015  . External carotid artery stenosis 07/12/2014    History reviewed. No pertinent past medical history.  History   Social History  . Marital Status: Divorced    Spouse Name: na  . Number of Children: 4  . Years of Education:  14   Occupational History  . retired    Social History Main Topics  . Smoking status: Never Smoker   . Smokeless tobacco: Never Used  . Alcohol Use: No  . Drug Use: No  . Sexual Activity: No   Other Topics Concern  . Not on file   Social History Narrative    Outpatient Prescriptions Prior to Visit  Medication Sig Dispense Refill  . ALPRAZolam (XANAX) 0.25 MG tablet Take 1 tablet (0.25 mg total) by mouth every 8 (eight) hours as needed for anxiety. 90 tablet 3  . amitriptyline (ELAVIL) 10 MG tablet Take by mouth.    Marland Kitchen amLODipine (NORVASC) 5 MG tablet Take by mouth.    Marland Kitchen aspirin 81 MG tablet Take by mouth.    Marland Kitchen atorvastatin (LIPITOR) 40 MG tablet Take 40 mg by mouth daily.    . fluticasone (VERAMYST) 27.5 MCG/SPRAY nasal spray Place 2 sprays into the nose daily.    . Fluticasone-Salmeterol (ADVAIR) 100-50 MCG/DOSE AEPB Inhale into the lungs.    . hydrALAZINE (APRESOLINE) 25 MG tablet Take 1 tablet (25 mg total) by mouth 2 (two) times daily. 60 tablet 12  . hydrochlorothiazide (HYDRODIURIL) 25 MG tablet Take 1 tablet (25 mg total) by mouth daily. 30 tablet 5  . levothyroxine (SYNTHROID, LEVOTHROID) 137 MCG tablet Take by mouth.    . losartan (COZAAR) 100 MG tablet Take by mouth.    . mirtazapine (REMERON) 30 MG tablet TAKE ONE TABLET  BY MOUTH ONCE DAILY 30 tablet 2  . Multiple Vitamin (MULTIVITAMIN) tablet Take 1 tablet by mouth daily.    . Probiotic CAPS Take by mouth.    . propranolol (INDERAL) 20 MG tablet Take 20 mg by mouth 2 (two) times daily.    . sertraline (ZOLOFT) 100 MG tablet Take 1 tablet (100 mg total) by mouth daily. 30 tablet 3   No facility-administered medications prior to visit.    Allergies  Allergen Reactions  . Codeine   . Levofloxacin     Mouth sores  . Simvastatin     Myalgias    Review of Systems  Respiratory: Negative for cough, hemoptysis, sputum production, shortness of breath and wheezing.   Cardiovascular: Negative.  Negative for chest  pain, palpitations, orthopnea and leg swelling.  Neurological: Positive for dizziness (Just when she takes the Xanax and when she wakes at night and gets up she may get a little dizzy for a  minute). Negative for headaches.  Psychiatric/Behavioral: Negative for depression, suicidal ideas, hallucinations, memory loss and substance abuse. The patient is nervous/anxious. The patient does not have insomnia.    Objective:  BP 164/56 mmHg  Pulse 84  Temp(Src) 98.1 F (36.7 C) (Oral)  Resp 16  Wt 167 lb (75.751 kg)  Physical Exam  Constitutional: She is oriented to person, place, and time and well-developed, well-nourished, and in no distress.  HENT:  Head: Normocephalic and atraumatic.  Right Ear: External ear normal.  Left Ear: External ear normal.  Nose: Nose normal.  Eyes: Conjunctivae are normal.  Neck: Normal range of motion. Neck supple.  Cardiovascular: Normal rate, regular rhythm and normal heart sounds.   Pulmonary/Chest: Effort normal and breath sounds normal.  Abdominal: Soft.  Neurological: She is alert and oriented to person, place, and time.  Skin: Skin is warm and dry.  Psychiatric: Mood, memory, affect and judgment normal.    Assessment and Plan :  Essential hypertension  Acute anxiety /Chronic Anxiety  Increase Hydralazine to 25 mg TID then 50mg  TID. RTC1-4 weeks. I have done the exam and reviewed the above chart and it is accurate to the best of my knowledge.    Miguel Aschoff MD [facility] 04/16/2015 3:07 PM

## 2015-05-07 ENCOUNTER — Ambulatory Visit: Payer: Self-pay | Admitting: Family Medicine

## 2015-05-13 ENCOUNTER — Ambulatory Visit: Payer: Medicare Other | Admitting: Family Medicine

## 2015-05-27 ENCOUNTER — Ambulatory Visit: Payer: Medicare Other | Admitting: Family Medicine

## 2015-06-03 ENCOUNTER — Encounter: Payer: Self-pay | Admitting: Family Medicine

## 2015-06-03 ENCOUNTER — Ambulatory Visit (INDEPENDENT_AMBULATORY_CARE_PROVIDER_SITE_OTHER): Payer: Medicare Other | Admitting: Family Medicine

## 2015-06-03 VITALS — BP 128/54 | HR 100 | Temp 98.1°F | Resp 18 | Wt 170.0 lb

## 2015-06-03 DIAGNOSIS — R197 Diarrhea, unspecified: Secondary | ICD-10-CM | POA: Diagnosis not present

## 2015-06-03 DIAGNOSIS — I10 Essential (primary) hypertension: Secondary | ICD-10-CM | POA: Diagnosis not present

## 2015-06-03 MED ORDER — COLESTIPOL HCL 5 G PO GRAN: 5.0000 g | GRANULES | Freq: Every day | ORAL | Status: DC

## 2015-06-03 NOTE — Progress Notes (Signed)
Patient ID: Stephanie Charles, female   DOB: [birth year], 81 y.o.   MRN: [id number]    Subjective:  HPI  Hypertension follow up: Patient is here to follow up from 3 weeks ago. Her Hydralazine was increased on the last visit 25 mg to three times day. She has checked her B/P and the highest reading was about 140/70 but has been staying under for the most part. No side effect to the medication. BP Readings from Last 3 Encounters:  06/03/15 128/54  04/16/15 164/56  03/25/15 182/66    Bowel incontinence: Patient has had some bowel incontinence in the last month twice and feels like she can not control it well, also noticed some mucus with bowel movements. She states these are the same symptoms when she had colonoscopy in July. She has not had any trouble today. NO abdominal pain, no nausea.  Prior to Admission medications   Medication Sig Start Date End Date Taking? Authorizing Provider  ALPRAZolam (XANAX) 0.25 MG tablet Take 1 tablet (0.25 mg total) by mouth every 8 (eight) hours as needed for anxiety. 03/25/15  Yes Richard Maceo Pro., MD  amitriptyline (ELAVIL) 10 MG tablet Take by mouth. 01/12/15  Yes Historical Provider, MD  amLODipine (NORVASC) 5 MG tablet Take by mouth. 04/12/14  Yes Historical Provider, MD  aspirin 81 MG tablet Take by mouth. 10/26/11  Yes Historical Provider, MD  atorvastatin (LIPITOR) 40 MG tablet Take 40 mg by mouth daily.   Yes Historical Provider, MD  fluticasone (VERAMYST) 27.5 MCG/SPRAY nasal spray Place 2 sprays into the nose daily.   Yes Historical Provider, MD  hydrALAZINE (APRESOLINE) 25 MG tablet Take 1 tablet (25 mg total) by mouth 2 (two) times daily. 03/25/15  Yes Richard Maceo Pro., MD  hydrochlorothiazide (HYDRODIURIL) 25 MG tablet Take 1 tablet (25 mg total) by mouth daily. 04/04/15  Yes Richard Maceo Pro., MD  levothyroxine (SYNTHROID, LEVOTHROID) 137 MCG tablet Take by mouth. 12/04/14  Yes Historical Provider, MD  losartan (COZAAR) 100 MG tablet Take  by mouth. 01/31/15  Yes Historical Provider, MD  mirtazapine (REMERON) 30 MG tablet TAKE ONE TABLET BY MOUTH ONCE DAILY 03/10/15  Yes Jerrol Banana., MD  Multiple Vitamin (MULTIVITAMIN) tablet Take 1 tablet by mouth daily.   Yes Historical Provider, MD  Probiotic CAPS Take by mouth.   Yes Historical Provider, MD  propranolol (INDERAL) 20 MG tablet Take 20 mg by mouth 2 (two) times daily.   Yes Historical Provider, MD  sertraline (ZOLOFT) 100 MG tablet Take 1 tablet (100 mg total) by mouth daily. 03/19/15  Yes Richard Maceo Pro., MD    Patient Active Problem List   Diagnosis Date Noted  . Abnormal blood sugar 03/11/2015  . Allergic rhinitis 03/11/2015  . Absolute anemia 03/11/2015  . Arthropathia 03/11/2015  . Benign essential HTN 03/11/2015  . Benign essential tremor 03/11/2015  . Artery disease, cerebral 03/11/2015  . Chronic venous insufficiency 03/11/2015  . Clinical depression 03/11/2015  . Urinary system disease 03/11/2015  . Acid reflux 03/11/2015  . HLD (hyperlipidemia) 03/11/2015  . Adult hypothyroidism 03/11/2015  . Irritable colon 03/11/2015  . Cannot sleep 03/11/2015  . Weak pulse 03/11/2015  . Carotid stenosis 03/11/2015  . Temporary cerebral vascular dysfunction 03/11/2015  . External carotid artery stenosis 07/12/2014    No past medical history on file.  Social History   Social History  . Marital Status: Divorced    Spouse Name: na  . Number of Children:  4  . Years of Education: 14   Occupational History  . retired    Social History Main Topics  . Smoking status: Never Smoker   . Smokeless tobacco: Never Used  . Alcohol Use: No  . Drug Use: No  . Sexual Activity: No   Other Topics Concern  . Not on file   Social History Narrative    Allergies  Allergen Reactions  . Codeine   . Levofloxacin     Mouth sores  . Simvastatin     Myalgias    Review of Systems  Constitutional: Negative.   Eyes: Negative.   Respiratory: Negative.     Cardiovascular: Negative.   Gastrointestinal: Negative.   Genitourinary: Negative.   Musculoskeletal: Negative.   Neurological: Negative.  Negative for headaches.  Psychiatric/Behavioral: Negative.     Immunization History  Administered Date(s) Administered  . Pneumococcal Polysaccharide-23 06/20/2013  . Tdap 02/23/2012  . Zoster 08/03/2011   Objective:  BP 128/54 mmHg  Pulse 100  Temp(Src) 98.1 F (36.7 C)  Resp 18  Wt 170 lb (77.111 kg)  Physical Exam  Constitutional: She is oriented to person, place, and time and well-developed, well-nourished, and in no distress.  HENT:  Head: Normocephalic.  Right Ear: External ear normal.  Left Ear: External ear normal.  Nose: Nose normal.  Eyes: Conjunctivae are normal.  Neck: Neck supple.  Cardiovascular: Normal rate, regular rhythm and normal heart sounds.   Pulmonary/Chest: Effort normal and breath sounds normal.  Abdominal: Soft.  Neurological: She is alert and oriented to person, place, and time.  Skin: Skin is warm and dry.  Psychiatric: Mood, memory, affect and judgment normal.    Lab Results  Component Value Date   WBC 7.9 12/06/2014   HGB 14.0 12/06/2014   HCT 40 12/06/2014   PLT 180 12/06/2014   GLUCOSE 112* 03/11/2015   CHOL 204* 12/06/2014   TRIG 241* 12/06/2014   HDL 31* 12/06/2014   LDLCALC 125 12/06/2014   TSH 0.84 12/06/2014   HGBA1C 5.5 10/19/2011    CMP     Component Value Date/Time   NA 143 03/11/2015 1446   NA 133* 04/26/2014 1141   K 3.7 03/11/2015 1446   K 3.9 04/26/2014 1141   CL 98 03/11/2015 1446   CL 99 04/26/2014 1141   CO2 28 03/11/2015 1446   CO2 27 04/26/2014 1141   GLUCOSE 112* 03/11/2015 1446   GLUCOSE 89 04/26/2014 1141   BUN 19 03/11/2015 1446   BUN 19* 04/26/2014 1141   CREATININE 1.01* 03/11/2015 1446   CREATININE 1.1 12/06/2014   CREATININE 1.20 04/26/2014 1141   CALCIUM 9.8 03/11/2015 1446   CALCIUM 8.8 04/26/2014 1141   PROT 7.4 04/26/2014 1141   ALBUMIN 3.8  04/26/2014 1141   AST 25 12/06/2014   AST 22 04/26/2014 1141   ALT 19 12/06/2014   ALT 29 04/26/2014 1141   ALKPHOS 83 12/06/2014   ALKPHOS 92 04/26/2014 1141   BILITOT 0.5 04/26/2014 1141   GFRNONAA 52* 03/11/2015 1446   GFRNONAA 42* 04/26/2014 1141   GFRNONAA 53* 10/19/2011 0323   GFRAA 60 03/11/2015 1446   GFRAA 49* 04/26/2014 1141   GFRAA >60 10/19/2011 0323    Assessment and Plan :  1. Diarrhea Try Colestid medication. May need GI referral if symptoms do not improve. - colestipol (COLESTID) 5 G granules; Take 5 g by mouth daily.  Dispense: 500 g; Refill: 12 Refer back to GI if this persists. 2. Essential hypertension Better  on the new dose.  3. Acute on chronic anxiety Issue issue is somewhat improved. Will be a chronic problem. We'll continue to manage. I have done the exam and reviewed the above chart and it is accurate to the best of my knowledge.   Miguel Aschoff MD [facility] 06/03/2015 2:47 PM

## 2015-06-04 ENCOUNTER — Other Ambulatory Visit: Payer: Self-pay | Admitting: Family Medicine

## 2015-06-05 ENCOUNTER — Other Ambulatory Visit: Payer: Self-pay

## 2015-06-05 MED ORDER — LOSARTAN POTASSIUM 100 MG PO TABS: 100.0000 mg | ORAL_TABLET | Freq: Every day | ORAL | Status: DC

## 2015-07-03 ENCOUNTER — Other Ambulatory Visit (INDEPENDENT_AMBULATORY_CARE_PROVIDER_SITE_OTHER): Payer: Medicare Other | Admitting: Family Medicine

## 2015-07-03 ENCOUNTER — Ambulatory Visit (INDEPENDENT_AMBULATORY_CARE_PROVIDER_SITE_OTHER): Payer: Medicare Other

## 2015-07-03 DIAGNOSIS — Z23 Encounter for immunization: Secondary | ICD-10-CM

## 2015-07-04 ENCOUNTER — Other Ambulatory Visit: Payer: Self-pay | Admitting: Family Medicine

## 2015-07-14 ENCOUNTER — Other Ambulatory Visit: Payer: Self-pay | Admitting: Family Medicine

## 2015-07-20 ENCOUNTER — Other Ambulatory Visit: Payer: Self-pay | Admitting: Family Medicine

## 2015-07-24 DIAGNOSIS — H01009 Unspecified blepharitis unspecified eye, unspecified eyelid: Secondary | ICD-10-CM | POA: Diagnosis not present

## 2015-07-30 DIAGNOSIS — H1033 Unspecified acute conjunctivitis, bilateral: Secondary | ICD-10-CM | POA: Diagnosis not present

## 2015-08-01 ENCOUNTER — Other Ambulatory Visit: Payer: Self-pay | Admitting: Family Medicine

## 2015-08-25 DIAGNOSIS — R42 Dizziness and giddiness: Secondary | ICD-10-CM | POA: Diagnosis not present

## 2015-08-25 DIAGNOSIS — I214 Non-ST elevation (NSTEMI) myocardial infarction: Secondary | ICD-10-CM | POA: Diagnosis not present

## 2015-08-25 DIAGNOSIS — G43909 Migraine, unspecified, not intractable, without status migrainosus: Secondary | ICD-10-CM | POA: Diagnosis not present

## 2015-08-25 DIAGNOSIS — I249 Acute ischemic heart disease, unspecified: Secondary | ICD-10-CM | POA: Diagnosis not present

## 2015-08-25 DIAGNOSIS — R7989 Other specified abnormal findings of blood chemistry: Secondary | ICD-10-CM

## 2015-08-25 DIAGNOSIS — G459 Transient cerebral ischemic attack, unspecified: Secondary | ICD-10-CM | POA: Diagnosis not present

## 2015-08-25 DIAGNOSIS — I160 Hypertensive urgency: Secondary | ICD-10-CM | POA: Diagnosis not present

## 2015-08-25 DIAGNOSIS — I447 Left bundle-branch block, unspecified: Secondary | ICD-10-CM | POA: Diagnosis not present

## 2015-08-25 DIAGNOSIS — F419 Anxiety disorder, unspecified: Secondary | ICD-10-CM | POA: Diagnosis not present

## 2015-08-25 DIAGNOSIS — F329 Major depressive disorder, single episode, unspecified: Secondary | ICD-10-CM | POA: Diagnosis not present

## 2015-08-25 DIAGNOSIS — Z885 Allergy status to narcotic agent status: Secondary | ICD-10-CM | POA: Diagnosis not present

## 2015-08-25 DIAGNOSIS — R778 Other specified abnormalities of plasma proteins: Secondary | ICD-10-CM | POA: Insufficient documentation

## 2015-08-25 DIAGNOSIS — E039 Hypothyroidism, unspecified: Secondary | ICD-10-CM | POA: Diagnosis not present

## 2015-08-26 DIAGNOSIS — I160 Hypertensive urgency: Secondary | ICD-10-CM | POA: Diagnosis not present

## 2015-08-26 DIAGNOSIS — I214 Non-ST elevation (NSTEMI) myocardial infarction: Secondary | ICD-10-CM | POA: Diagnosis not present

## 2015-08-26 DIAGNOSIS — R079 Chest pain, unspecified: Secondary | ICD-10-CM | POA: Diagnosis not present

## 2015-08-26 DIAGNOSIS — R7989 Other specified abnormal findings of blood chemistry: Secondary | ICD-10-CM | POA: Diagnosis not present

## 2015-08-26 DIAGNOSIS — I10 Essential (primary) hypertension: Secondary | ICD-10-CM | POA: Diagnosis not present

## 2015-08-26 DIAGNOSIS — G459 Transient cerebral ischemic attack, unspecified: Secondary | ICD-10-CM | POA: Diagnosis not present

## 2015-08-26 DIAGNOSIS — I639 Cerebral infarction, unspecified: Secondary | ICD-10-CM | POA: Diagnosis not present

## 2015-08-26 DIAGNOSIS — E039 Hypothyroidism, unspecified: Secondary | ICD-10-CM | POA: Diagnosis not present

## 2015-09-03 ENCOUNTER — Ambulatory Visit (INDEPENDENT_AMBULATORY_CARE_PROVIDER_SITE_OTHER): Payer: Medicare Other | Admitting: Family Medicine

## 2015-09-03 ENCOUNTER — Encounter: Payer: Self-pay | Admitting: Family Medicine

## 2015-09-03 VITALS — BP 128/64 | HR 84 | Temp 98.1°F | Resp 16 | Wt 170.0 lb

## 2015-09-03 DIAGNOSIS — F32A Depression, unspecified: Secondary | ICD-10-CM

## 2015-09-03 DIAGNOSIS — F329 Major depressive disorder, single episode, unspecified: Secondary | ICD-10-CM | POA: Diagnosis not present

## 2015-09-03 MED ORDER — SERTRALINE HCL 25 MG PO TABS: 25.0000 mg | ORAL_TABLET | Freq: Every day | ORAL | Status: DC

## 2015-09-03 NOTE — Progress Notes (Signed)
Patient ID: Stephanie Charles, female   DOB: 25-Sep-1930, 81 y.o.   MRN: [id number]       Patient: Stephanie Charles Female    DOB: 12/11/1929   81 y.o.   MRN: [id number] Visit Date: 09/03/2015  Today's Provider: [name], MD   Chief Complaint  Patient presents with  . Hospitalization Follow-up  . Hypertension   Subjective:   Hospital Follow up Patient was seen in the ER due to elevated blood pressure. She reports that all tests came back normal. Adjustments were made in her medications. She now takes Plavix 75mg  daily and they decreased Zoloft to 25mg  (patient was initially on 100mg ). Patient reports that she is tolerating med changes well.  Hypertension This is a chronic problem. The problem has been gradually worsening since onset. Pertinent negatives include no anxiety, blurred vision, chest pain, headaches, malaise/fatigue, neck pain, orthopnea, palpitations, peripheral edema, PND, shortness of breath or sweats. There are no compliance problems.   Patient reports that she has not checked her BP in the past 2 days. She reports that her BP usually runs between 120-130s/60s. Patient has been consistent with taking meds daily.      Allergies  Allergen Reactions  . Codeine   . Levofloxacin     Mouth sores  . Simvastatin     Myalgias   Previous Medications   ALPRAZOLAM (XANAX) 0.25 MG TABLET    Take 1 tablet (0.25 mg total) by mouth every 8 (eight) hours as needed for anxiety.   AMITRIPTYLINE (ELAVIL) 10 MG TABLET    Take by mouth.   AMLODIPINE (NORVASC) 5 MG TABLET    TAKE ONE TABLET BY MOUTH ONCE DAILY   ASPIRIN 81 MG TABLET    Take by mouth.   ATORVASTATIN (LIPITOR) 40 MG TABLET    Take 40 mg by mouth daily.   COLESTIPOL (COLESTID) 5 G GRANULES    Take 5 g by mouth daily.   FLUTICASONE (VERAMYST) 27.5 MCG/SPRAY NASAL SPRAY    Place 2 sprays into the nose daily.   HYDRALAZINE (APRESOLINE) 25 MG TABLET    Take 1 tablet (25 mg total) by mouth 2 (two) times daily.   HYDROCHLOROTHIAZIDE (HYDRODIURIL) 25 MG TABLET    Take 1 tablet (25 mg total) by mouth daily.   LEVOTHYROXINE (SYNTHROID, LEVOTHROID) 137 MCG TABLET    TAKE ONE TABLET BY MOUTH ONCE DAILY   LOSARTAN (COZAAR) 100 MG TABLET    Take 1 tablet (100 mg total) by mouth daily.   MIRTAZAPINE (REMERON) 30 MG TABLET    TAKE ONE TABLET BY MOUTH ONCE DAILY   MULTIPLE VITAMIN (MULTIVITAMIN) TABLET    Take 1 tablet by mouth daily.   PROBIOTIC CAPS    Take by mouth.   PROPRANOLOL (INDERAL) 20 MG TABLET    Take 20 mg by mouth 2 (two) times daily.   SERTRALINE (ZOLOFT) 100 MG TABLET    TAKE ONE TABLET BY MOUTH ONCE DAILY    Review of Systems  Constitutional: Negative.  Negative for malaise/fatigue.  HENT: Negative.   Eyes: Negative.  Negative for blurred vision.  Respiratory: Negative.  Negative for shortness of breath.   Cardiovascular: Negative.  Negative for chest pain, palpitations, orthopnea and PND.  Endocrine: Negative.   Genitourinary: Negative.   Musculoskeletal: Negative.  Negative for neck pain.  Allergic/Immunologic: Negative.   Neurological: Negative.  Negative for headaches.  Psychiatric/Behavioral: Negative.     Social History  Substance Use Topics  . Smoking status: Never  Smoker   . Smokeless tobacco: Never Used  . Alcohol Use: No   Objective:   BP 128/64 mmHg  Pulse 84  Temp(Src) 98.1 F (36.7 C)  Resp 16  Wt 170 lb (77.111 kg)  SpO2 94%  Physical Exam  Constitutional: She is oriented to person, place, and time. She appears well-developed and well-nourished.  HENT:  Head: Normocephalic and atraumatic.  Right Ear: External ear normal.  Left Ear: External ear normal.  Nose: Nose normal.  Eyes: EOM are normal. Pupils are equal, round, and reactive to light.  Neck: Neck supple.  Cardiovascular: Normal rate, regular rhythm and normal heart sounds.   Pulmonary/Chest: Effort normal and breath sounds normal.  Abdominal: Soft.  Neurological: She is alert and oriented to  person, place, and time. No cranial nerve deficit. She exhibits normal muscle tone. Coordination normal.  Grossly nonfocal.  Skin: Skin is warm and dry.  Psychiatric: She has a normal mood and affect. Her behavior is normal. Judgment and thought content normal.        Assessment & Plan:     1. Clinical depression/chronic anxiety F/u 1 month on lower dose of zoloft. - sertraline (ZOLOFT) 25 MG tablet; Take 1 tablet (25 mg total) by mouth daily.  Dispense: 30 tablet; Refill: 6 2.HTN--labile Improved. RTC2-6 weeks. 3.TIA Pt now on Plavix. Has appt with neurology. 4.Chronic Right Goiter      [name], MD  [facility]

## 2015-09-18 ENCOUNTER — Ambulatory Visit: Payer: Self-pay | Admitting: Family Medicine

## 2015-09-22 DIAGNOSIS — R7989 Other specified abnormal findings of blood chemistry: Secondary | ICD-10-CM | POA: Diagnosis not present

## 2015-10-05 ENCOUNTER — Other Ambulatory Visit: Payer: Self-pay | Admitting: Family Medicine

## 2015-10-17 ENCOUNTER — Ambulatory Visit (INDEPENDENT_AMBULATORY_CARE_PROVIDER_SITE_OTHER): Payer: Medicare Other | Admitting: Physician Assistant

## 2015-10-17 ENCOUNTER — Encounter: Payer: Self-pay | Admitting: Physician Assistant

## 2015-10-17 VITALS — BP 156/60 | HR 95 | Temp 98.0°F | Resp 16 | Wt 174.0 lb

## 2015-10-17 DIAGNOSIS — R42 Dizziness and giddiness: Secondary | ICD-10-CM

## 2015-10-17 DIAGNOSIS — J0140 Acute pansinusitis, unspecified: Secondary | ICD-10-CM

## 2015-10-17 DIAGNOSIS — R05 Cough: Secondary | ICD-10-CM | POA: Diagnosis not present

## 2015-10-17 DIAGNOSIS — R059 Cough, unspecified: Secondary | ICD-10-CM

## 2015-10-17 MED ORDER — AMOXICILLIN-POT CLAVULANATE 875-125 MG PO TABS: 1.0000 | ORAL_TABLET | Freq: Two times a day (BID) | ORAL | Status: DC

## 2015-10-17 MED ORDER — HYDROCOD POLST-CPM POLST ER 10-8 MG/5ML PO SUER: 5.0000 mL | Freq: Two times a day (BID) | ORAL | Status: DC | PRN

## 2015-10-17 MED ORDER — MECLIZINE HCL 25 MG PO TABS: 25.0000 mg | ORAL_TABLET | Freq: Three times a day (TID) | ORAL | Status: DC | PRN

## 2015-10-17 NOTE — Progress Notes (Signed)
Patient: Stephanie Charles Female    DOB: 1930-01-02   82 y.o.   MRN: [id number] Visit Date: 10/17/2015  Today's Provider: Mar Daring, PA-C   Chief Complaint  Patient presents with  . Cough   Subjective:    Cough This is a new problem. The current episode started 1 to 4 weeks ago. The problem has been gradually worsening. The problem occurs constantly. The cough is non-productive. Associated symptoms include nasal congestion, postnasal drip, rhinorrhea, shortness of breath and wheezing. Pertinent negatives include no chest pain, chills, ear pain, fever, headaches or sore throat. The symptoms are aggravated by lying down. Treatments tried: Tessonex. The treatment provided no relief.      Allergies  Allergen Reactions  . Codeine   . Levofloxacin     Mouth sores  . Simvastatin     Myalgias   Previous Medications   AMITRIPTYLINE (ELAVIL) 10 MG TABLET    Take by mouth.   AMLODIPINE (NORVASC) 5 MG TABLET    TAKE ONE TABLET BY MOUTH ONCE DAILY   ASPIRIN 81 MG TABLET    Take by mouth.   ATORVASTATIN (LIPITOR) 40 MG TABLET    Take 40 mg by mouth daily.   COLESTIPOL (COLESTID) 5 G GRANULES    Take 5 g by mouth daily.   FLUTICASONE (VERAMYST) 27.5 MCG/SPRAY NASAL SPRAY    Place 2 sprays into the nose daily.   HYDRALAZINE (APRESOLINE) 25 MG TABLET    Take 1 tablet (25 mg total) by mouth 2 (two) times daily.   HYDROCHLOROTHIAZIDE (HYDRODIURIL) 25 MG TABLET    TAKE ONE TABLET BY MOUTH ONCE DAILY   LEVOTHYROXINE (SYNTHROID, LEVOTHROID) 137 MCG TABLET    TAKE ONE TABLET BY MOUTH ONCE DAILY   LOSARTAN (COZAAR) 100 MG TABLET    Take 1 tablet (100 mg total) by mouth daily.   MIRTAZAPINE (REMERON) 30 MG TABLET    TAKE ONE TABLET BY MOUTH ONCE DAILY   MULTIPLE VITAMIN (MULTIVITAMIN) TABLET    Take 1 tablet by mouth daily.   PROPRANOLOL (INDERAL) 20 MG TABLET    Take 20 mg by mouth 2 (two) times daily. Reported on 10/17/2015   SERTRALINE (ZOLOFT) 25 MG TABLET    Take 1 tablet (25 mg  total) by mouth daily.    Review of Systems  Constitutional: Negative for fever and chills.  HENT: Positive for congestion, postnasal drip and rhinorrhea. Negative for ear pain, sinus pressure, sneezing and sore throat.   Eyes: Negative.   Respiratory: Positive for cough, chest tightness, shortness of breath and wheezing.   Cardiovascular: Negative for chest pain.  Gastrointestinal: Negative for nausea, vomiting and abdominal pain.  Neurological: Negative for dizziness and headaches.  All other systems reviewed and are negative.   Social History  Substance Use Topics  . Smoking status: Never Smoker   . Smokeless tobacco: Never Used  . Alcohol Use: No   Objective:   BP 156/60 mmHg  Pulse 95  Temp(Src) 98 F (36.7 C) (Oral)  Resp 16  Wt 174 lb (78.926 kg)  SpO2 95%  Physical Exam  Constitutional: She appears well-developed and well-nourished. No distress.  HENT:  Head: Normocephalic and atraumatic.  Right Ear: Hearing, external ear and ear canal normal. Tympanic membrane is not erythematous and not bulging. A middle ear effusion is present.  Left Ear: Hearing, external ear and ear canal normal. Tympanic membrane is not erythematous and not bulging. A middle ear effusion is present.  Nose: Mucosal edema and rhinorrhea present. Right sinus exhibits maxillary sinus tenderness and frontal sinus tenderness. Left sinus exhibits maxillary sinus tenderness and frontal sinus tenderness.  Mouth/Throat: Uvula is midline, oropharynx is clear and moist and mucous membranes are normal. No oropharyngeal exudate, posterior oropharyngeal edema or posterior oropharyngeal erythema.  Eyes: Conjunctivae are normal. Pupils are equal, round, and reactive to light. Right eye exhibits no discharge. Left eye exhibits no discharge. No scleral icterus.  Neck: Normal range of motion. Neck supple. No tracheal deviation present. No thyromegaly present.  Cardiovascular: Normal rate, regular rhythm and normal  heart sounds.  Exam reveals no gallop and no friction rub.   No murmur heard. Pulmonary/Chest: Effort normal and breath sounds normal. No stridor. No respiratory distress. She has no wheezes. She has no rales.  Lymphadenopathy:    She has no cervical adenopathy.  Skin: Skin is warm and dry. She is not diaphoretic.  Vitals reviewed.       Assessment & Plan:     1. Acute pansinusitis, recurrence not specified Worsening symptoms. She has not had any response to over-the-counter medications. I will treat her with Augmentin as below. She may take Mucinex DM or Coricidin HBP for congestion. She needs to make sure to stay well-hydrated and to get plenty of rest. She is to call the office if symptoms fail to improve or worsen. - amoxicillin-clavulanate (AUGMENTIN) 875-125 MG tablet; Take 1 tablet by mouth 2 (two) times daily.  Dispense: 20 tablet; Refill: 0  2. Vertigo She states that she does have vertigo episodes and has had for a while. She has been treated for this in the past with meclizine successfully. She feels the congestion is causing her to have some worsening vertigo at this time and she would like a refill on her meclizine. This was refilled as below. She is to call the office if she develops any worsening symptoms such as severe headache, double vision, severe nausea with intractable vomiting or unsteadiness. - meclizine (ANTIVERT) 25 MG tablet; Take 1 tablet (25 mg total) by mouth 3 (three) times daily as needed for dizziness.  Dispense: 30 tablet; Refill: 0  3. Cough Worsening nighttime cough when she lies down flat. She states this is caused her to not get good rest for the last 3 nights. I will give her Tussionex cough syrup as below. She is to call the office if her cough fails to improve or worsens. - chlorpheniramine-HYDROcodone (TUSSIONEX PENNKINETIC ER) 10-8 MG/5ML SUER; Take 5 mLs by mouth every 12 (twelve) hours as needed for cough.  Dispense: 140 mL; Refill: 0        Mar Daring, PA-C  [facility]

## 2015-10-22 ENCOUNTER — Ambulatory Visit: Payer: Self-pay | Admitting: Family Medicine

## 2015-10-28 ENCOUNTER — Ambulatory Visit (INDEPENDENT_AMBULATORY_CARE_PROVIDER_SITE_OTHER): Payer: Medicare Other | Admitting: Family Medicine

## 2015-10-28 VITALS — BP 162/60 | HR 92 | Temp 98.2°F | Resp 18 | Wt 172.0 lb

## 2015-10-28 DIAGNOSIS — I10 Essential (primary) hypertension: Secondary | ICD-10-CM

## 2015-10-28 DIAGNOSIS — F32A Depression, unspecified: Secondary | ICD-10-CM

## 2015-10-28 DIAGNOSIS — F329 Major depressive disorder, single episode, unspecified: Secondary | ICD-10-CM

## 2015-10-28 DIAGNOSIS — G4700 Insomnia, unspecified: Secondary | ICD-10-CM

## 2015-10-28 MED ORDER — AMITRIPTYLINE HCL 10 MG PO TABS: 20.0000 mg | ORAL_TABLET | Freq: Every day | ORAL | Status: DC

## 2015-10-28 NOTE — Progress Notes (Signed)
Patient ID: Stephanie Charles, female   DOB: 03/23/1930, 82 y.o.   MRN: [id number]   Stephanie Charles  MRN: [id number] DOB: May 24, 1930  Subjective:  HPI   1. Clinical depression The patient is an 82 year old female who presents for follow up of her depression.  She was last seen for this diagnosis in November.  No management changes were made at that time, however, that visit was to follow up after her dose of Zoloft had been decreased.  She reports good compliance and tolerance of her medication.     2. Essential hypertension The patient is also here to follow up on her hypertension.  She last visit for this was also in November and at that time her blood pressure was improved.  She reports good compliance and tolerance of her medications.    Patient Active Problem List   Diagnosis Date Noted  . Elevated troponin I level 08/25/2015  . Abnormal blood sugar 03/11/2015  . Allergic rhinitis 03/11/2015  . Absolute anemia 03/11/2015  . Arthropathia 03/11/2015  . Benign essential HTN 03/11/2015  . Benign essential tremor 03/11/2015  . Artery disease, cerebral 03/11/2015  . Chronic venous insufficiency 03/11/2015  . Clinical depression 03/11/2015  . Urinary system disease 03/11/2015  . Acid reflux 03/11/2015  . HLD (hyperlipidemia) 03/11/2015  . Adult hypothyroidism 03/11/2015  . Irritable colon 03/11/2015  . Cannot sleep 03/11/2015  . Weak pulse 03/11/2015  . Carotid stenosis 03/11/2015  . Temporary cerebral vascular dysfunction 03/11/2015  . External carotid artery stenosis 07/12/2014    No past medical history on file.  Social History   Social History  . Marital Status: Divorced    Spouse Name: na  . Number of Children: 4  . Years of Education: 14   Occupational History  . retired    Social History Main Topics  . Smoking status: Never Smoker   . Smokeless tobacco: Never Used  . Alcohol Use: No  . Drug Use: No  . Sexual Activity: No   Other Topics Concern  . Not on  file   Social History Narrative    Outpatient Prescriptions Prior to Visit  Medication Sig Dispense Refill  . amitriptyline (ELAVIL) 10 MG tablet Take by mouth.    Marland Kitchen amLODipine (NORVASC) 5 MG tablet TAKE ONE TABLET BY MOUTH ONCE DAILY 30 tablet 12  . aspirin 81 MG tablet Take by mouth.    Marland Kitchen atorvastatin (LIPITOR) 40 MG tablet Take 40 mg by mouth daily.    . colestipol (COLESTID) 5 G granules Take 5 g by mouth daily. 500 g 12  . fluticasone (VERAMYST) 27.5 MCG/SPRAY nasal spray Place 2 sprays into the nose daily.    . hydrALAZINE (APRESOLINE) 25 MG tablet Take 1 tablet (25 mg total) by mouth 2 (two) times daily. 60 tablet 12  . hydrochlorothiazide (HYDRODIURIL) 25 MG tablet TAKE ONE TABLET BY MOUTH ONCE DAILY 30 tablet 0  . levothyroxine (SYNTHROID, LEVOTHROID) 137 MCG tablet TAKE ONE TABLET BY MOUTH ONCE DAILY 30 tablet 12  . losartan (COZAAR) 100 MG tablet Take 1 tablet (100 mg total) by mouth daily. 30 tablet 12  . meclizine (ANTIVERT) 25 MG tablet Take 1 tablet (25 mg total) by mouth 3 (three) times daily as needed for dizziness. 30 tablet 0  . mirtazapine (REMERON) 30 MG tablet TAKE ONE TABLET BY MOUTH ONCE DAILY 30 tablet 12  . Multiple Vitamin (MULTIVITAMIN) tablet Take 1 tablet by mouth daily.    . propranolol (INDERAL)  20 MG tablet Take 20 mg by mouth 2 (two) times daily. Reported on 10/17/2015    . sertraline (ZOLOFT) 25 MG tablet Take 1 tablet (25 mg total) by mouth daily. 30 tablet 6  . amoxicillin-clavulanate (AUGMENTIN) 875-125 MG tablet Take 1 tablet by mouth 2 (two) times daily. 20 tablet 0  . chlorpheniramine-HYDROcodone (TUSSIONEX PENNKINETIC ER) 10-8 MG/5ML SUER Take 5 mLs by mouth every 12 (twelve) hours as needed for cough. 140 mL 0   No facility-administered medications prior to visit.    Allergies  Allergen Reactions  . Codeine   . Levofloxacin     Mouth sores  . Simvastatin     Myalgias    Review of Systems  HENT: Negative.   Eyes: Negative.     Cardiovascular: Negative.   Gastrointestinal: Negative.   Skin: Negative.   Psychiatric/Behavioral: Negative.    Objective:  BP 162/60 mmHg  Pulse 92  Temp(Src) 98.2 F (36.8 C) (Oral)  Resp 18  Wt 172 lb (78.019 kg)  Physical Exam  Constitutional: She is oriented to person, place, and time and well-developed, well-nourished, and in no distress.  HENT:  Head: Normocephalic and atraumatic.  Right Ear: External ear normal.  Nose: Nose normal.  Eyes: Conjunctivae are normal.  Neck: Neck supple.  Cardiovascular: Normal rate, regular rhythm and normal heart sounds.   Pulmonary/Chest: Effort normal and breath sounds normal.  Abdominal: Soft.  Neurological: She is alert and oriented to person, place, and time. Gait normal.  Skin: Skin is warm and dry.  Psychiatric: Mood, memory, affect and judgment normal.    Assessment and Plan :   1. Clinical depression Stable, no changes.  2. Essential hypertension Stable, no changes.  Patient to bring in all of her medications on the next visit.   3. Insomnia  - amitriptyline (ELAVIL) 10 MG tablet; Take 2 tablets (20 mg total) by mouth at bedtime.  Dispense: 60 tablet; Refill: [facility] 10/28/2015 2:55 PM

## 2015-11-04 ENCOUNTER — Other Ambulatory Visit: Payer: Self-pay | Admitting: Family Medicine

## 2015-11-15 ENCOUNTER — Other Ambulatory Visit: Payer: Self-pay | Admitting: Physician Assistant

## 2015-11-15 DIAGNOSIS — R42 Dizziness and giddiness: Secondary | ICD-10-CM

## 2015-11-24 ENCOUNTER — Telehealth: Payer: Self-pay

## 2015-11-24 MED ORDER — CLOPIDOGREL BISULFATE 75 MG PO TABS: 75.0000 mg | ORAL_TABLET | Freq: Every day | ORAL | Status: DC

## 2015-11-24 NOTE — Telephone Encounter (Signed)
Refill request from [facility] for plavix 75 mg . Please review-aa

## 2015-11-24 NOTE — Telephone Encounter (Signed)
Ok for 1 year. 

## 2015-11-24 NOTE — Telephone Encounter (Signed)
Done-aa 

## 2015-11-25 ENCOUNTER — Telehealth: Payer: Self-pay | Admitting: Family Medicine

## 2015-11-26 MED ORDER — CLOPIDOGREL BISULFATE 75 MG PO TABS: 75.0000 mg | ORAL_TABLET | Freq: Every day | ORAL | Status: DC

## 2015-11-26 NOTE — Telephone Encounter (Signed)
Pt is calling regarding her clopidogrel (PLAVIX) 75 MG tablet.  She says [facility] told her they do not have the RX request.  [address]  Her call back is [phone]  Thanks, Con Memos

## 2015-11-26 NOTE — Telephone Encounter (Signed)
Advised patient that we sent it in on the 20th and per epic it showed pharmacy received it. i advised patient that i will resent the medication again and to check with pharmacy a little later.-aa

## 2015-12-01 ENCOUNTER — Other Ambulatory Visit: Payer: Self-pay | Admitting: Family Medicine

## 2015-12-31 ENCOUNTER — Other Ambulatory Visit: Payer: Self-pay | Admitting: Family Medicine

## 2015-12-31 DIAGNOSIS — I10 Essential (primary) hypertension: Secondary | ICD-10-CM

## 2016-01-27 ENCOUNTER — Ambulatory Visit: Payer: Self-pay | Admitting: Family Medicine

## 2016-02-26 ENCOUNTER — Other Ambulatory Visit: Payer: Self-pay | Admitting: Family Medicine

## 2016-02-26 MED ORDER — ATORVASTATIN CALCIUM 40 MG PO TABS: 40.0000 mg | ORAL_TABLET | Freq: Every day | ORAL | Status: DC

## 2016-02-26 NOTE — Telephone Encounter (Signed)
Please review. Patient was last seen on 10/2015. Thanks!

## 2016-02-26 NOTE — Telephone Encounter (Signed)
Pt called requesting refill on atorvastatin (LIPITOR) 40 MG tablet[facility] [facility] over [facility]

## 2016-03-09 ENCOUNTER — Other Ambulatory Visit: Payer: Self-pay | Admitting: Physician Assistant

## 2016-03-25 ENCOUNTER — Encounter: Payer: Self-pay | Admitting: Family Medicine

## 2016-03-25 ENCOUNTER — Ambulatory Visit (INDEPENDENT_AMBULATORY_CARE_PROVIDER_SITE_OTHER): Payer: Medicare Other | Admitting: Family Medicine

## 2016-03-25 VITALS — BP 140/60 | HR 114 | Temp 98.1°F | Resp 18 | Wt 169.0 lb

## 2016-03-25 DIAGNOSIS — M10031 Idiopathic gout, right wrist: Secondary | ICD-10-CM | POA: Diagnosis not present

## 2016-03-25 DIAGNOSIS — J309 Allergic rhinitis, unspecified: Secondary | ICD-10-CM

## 2016-03-25 DIAGNOSIS — M109 Gout, unspecified: Secondary | ICD-10-CM

## 2016-03-25 MED ORDER — FLUTICASONE FUROATE 27.5 MCG/SPRAY NA SUSP: 2.0000 | Freq: Every day | NASAL | Status: DC

## 2016-03-25 MED ORDER — PREDNISONE 10 MG (21) PO TBPK: 10.0000 mg | ORAL_TABLET | Freq: Every day | ORAL | Status: DC

## 2016-03-25 NOTE — Progress Notes (Signed)
Patient ID: Stephanie Charles, female   DOB: [birth year], 82 y.o.   MRN: [id number]    Subjective:  HPI Pt is here today for right hand/wrist pain. She reports that it started yesterday. She has no known trauma to the area. She was canning beans 4 days ago but the pain did not start until yesterday. She describes the pain as "it just hurts" and sometimes she will have a sharp pain that runs up into her elbow. Pt reports that she can not move her wrist or do anything with her wrist. She says it feels better if she keeps it rested on her chest. The pain was so bad last night that it kept her away and is making her shake, also nauseous last night. She has had gout and had a recent flare in her big toe but she did not take anything she rubbed pain relief on it and it is better. She does report that this feels similar.   Prior to Admission medications   Medication Sig Start Date End Date Taking? Authorizing Provider  amitriptyline (ELAVIL) 10 MG tablet Take 2 tablets (20 mg total) by mouth at bedtime. 10/28/15  Yes Richard Maceo Pro., MD  amLODipine (NORVASC) 5 MG tablet TAKE ONE TABLET BY MOUTH ONCE DAILY 08/04/15  Yes Jerrol Banana., MD  aspirin 81 MG tablet Take by mouth. 10/26/11  Yes Historical Provider, MD  atorvastatin (LIPITOR) 40 MG tablet Take 1 tablet (40 mg total) by mouth daily. 02/26/16  Yes Margarita Rana, MD  clopidogrel (PLAVIX) 75 MG tablet Take 1 tablet (75 mg total) by mouth daily. 11/26/15  Yes Richard Maceo Pro., MD  fluticasone (VERAMYST) 27.5 MCG/SPRAY nasal spray Place 2 sprays into the nose daily.   Yes Historical Provider, MD  hydrALAZINE (APRESOLINE) 25 MG tablet Take 1 tablet (25 mg total) by mouth 2 (two) times daily. 03/25/15  Yes Richard Maceo Pro., MD  hydrochlorothiazide (HYDRODIURIL) 25 MG tablet TAKE ONE TABLET BY MOUTH ONCE DAILY 12/31/15  Yes Jerrol Banana., MD  levothyroxine (SYNTHROID, LEVOTHROID) 137 MCG tablet TAKE ONE TABLET BY MOUTH ONCE DAILY  08/04/15  Yes Jerrol Banana., MD  losartan (COZAAR) 100 MG tablet Take 1 tablet (100 mg total) by mouth daily. 06/05/15  Yes Richard Maceo Pro., MD  meclizine (ANTIVERT) 25 MG tablet TAKE ONE TABLET BY MOUTH THREE TIMES DAILY AS NEEDED FOR DIZZINESS 03/10/16  Yes Richard Maceo Pro., MD  mirtazapine (REMERON) 30 MG tablet TAKE ONE TABLET BY MOUTH ONCE DAILY 08/04/15  Yes Jerrol Banana., MD  Multiple Vitamin (MULTIVITAMIN) tablet Take 1 tablet by mouth daily.   Yes Historical Provider, MD  propranolol (INDERAL) 20 MG tablet Take 20 mg by mouth 2 (two) times daily. Reported on 10/17/2015   Yes Historical Provider, MD  sertraline (ZOLOFT) 25 MG tablet Take 1 tablet (25 mg total) by mouth daily. 09/03/15  Yes Richard Maceo Pro., MD    Patient Active Problem List   Diagnosis Date Noted  . Elevated troponin I level 08/25/2015  . Abnormal blood sugar 03/11/2015  . Allergic rhinitis 03/11/2015  . Absolute anemia 03/11/2015  . Arthropathia 03/11/2015  . Benign essential HTN 03/11/2015  . Benign essential tremor 03/11/2015  . Artery disease, cerebral 03/11/2015  . Chronic venous insufficiency 03/11/2015  . Clinical depression 03/11/2015  . Urinary system disease 03/11/2015  . Acid reflux 03/11/2015  . HLD (hyperlipidemia) 03/11/2015  . Adult hypothyroidism 03/11/2015  .  Irritable colon 03/11/2015  . Cannot sleep 03/11/2015  . Weak pulse 03/11/2015  . Carotid stenosis 03/11/2015  . Temporary cerebral vascular dysfunction 03/11/2015  . External carotid artery stenosis 07/12/2014    History reviewed. No pertinent past medical history.  Social History   Social History  . Marital Status: Divorced    Spouse Name: na  . Number of Children: 4  . Years of Education: 14   Occupational History  . retired    Social History Main Topics  . Smoking status: Never Smoker   . Smokeless tobacco: Never Used  . Alcohol Use: No  . Drug Use: No  . Sexual Activity: No   Other  Topics Concern  . Not on file   Social History Narrative    Allergies  Allergen Reactions  . Codeine   . Levofloxacin     Mouth sores  . Simvastatin     Myalgias    Review of Systems  Constitutional: Positive for malaise/fatigue.  HENT: Negative.   Eyes: Negative.   Respiratory: Negative.   Cardiovascular: Negative.   Gastrointestinal: Negative.   Musculoskeletal: Positive for joint pain.  Skin: Negative.   Neurological: Negative.   Endo/Heme/Allergies: Negative.   Psychiatric/Behavioral: Negative.     Immunization History  Administered Date(s) Administered  . Influenza, High Dose Seasonal PF 07/03/2015  . Pneumococcal Conjugate-13 07/03/2015  . Pneumococcal Polysaccharide-23 06/20/2013  . Tdap 02/23/2012  . Zoster 08/03/2011   Objective:  BP 140/60 mmHg  Pulse 114  Temp(Src) 98.1 F (36.7 C) (Oral)  Resp 18  Wt 169 lb (76.658 kg)  SpO2 95%  Physical Exam  Constitutional: She is oriented to person, place, and time and well-developed, well-nourished, and in no distress.  Eyes: Conjunctivae and EOM are normal. Pupils are equal, round, and reactive to light.  Neck: Normal range of motion. Neck supple.  Cardiovascular: Normal rate, regular rhythm, normal heart sounds and intact distal pulses.   Pulmonary/Chest: Effort normal and breath sounds normal.  Musculoskeletal: She exhibits edema and tenderness.  Defused swelling over the ulna styloyd with significant warmth.  Neurological: She is alert and oriented to person, place, and time. She has normal reflexes. Gait normal. GCS score is 15.  Skin: Skin is warm and dry.  Psychiatric: Mood, memory, affect and judgment normal.    Lab Results  Component Value Date   WBC 7.9 12/06/2014   HGB 14.0 12/06/2014   HCT 40 12/06/2014   PLT 180 12/06/2014   GLUCOSE 112* 03/11/2015   CHOL 204* 12/06/2014   TRIG 241* 12/06/2014   HDL 31* 12/06/2014   LDLCALC 125 12/06/2014   TSH 0.84 12/06/2014   HGBA1C 5.5 10/19/2011     CMP     Component Value Date/Time   NA 143 03/11/2015 1446   NA 133* 04/26/2014 1141   K 3.7 03/11/2015 1446   K 3.9 04/26/2014 1141   CL 98 03/11/2015 1446   CL 99 04/26/2014 1141   CO2 28 03/11/2015 1446   CO2 27 04/26/2014 1141   GLUCOSE 112* 03/11/2015 1446   GLUCOSE 89 04/26/2014 1141   BUN 19 03/11/2015 1446   BUN 19* 04/26/2014 1141   CREATININE 1.01* 03/11/2015 1446   CREATININE 1.1 12/06/2014   CREATININE 1.20 04/26/2014 1141   CALCIUM 9.8 03/11/2015 1446   CALCIUM 8.8 04/26/2014 1141   PROT 7.4 04/26/2014 1141   ALBUMIN 4.6 03/11/2015 1446   ALBUMIN 3.8 04/26/2014 1141   AST 25 12/06/2014   AST 22 04/26/2014 1141  ALT 19 12/06/2014   ALT 29 04/26/2014 1141   ALKPHOS 83 12/06/2014   ALKPHOS 92 04/26/2014 1141   BILITOT 0.5 04/26/2014 1141   GFRNONAA 52* 03/11/2015 1446   GFRNONAA 42* 04/26/2014 1141   GFRNONAA 53* 10/19/2011 0323   GFRAA 60 03/11/2015 1446   GFRAA 49* 04/26/2014 1141   GFRAA >60 10/19/2011 0323    Assessment and Plan :  1. Acute gout of right wrist, unspecified cause Probably need to stop HCTZ only visit. - DG Wrist Complete Right; Future - predniSONE (STERAPRED UNI-PAK 21 TAB) 10 MG (21) TBPK tablet; Take 1 tablet (10 mg total) by mouth daily. Use taper as directed  Dispense: 21 tablet; Refill: 0  2. Allergic rhinitis, unspecified allergic rhinitis type  - fluticasone (VERAMYST) 27.5 MCG/SPRAY nasal spray; Place 2 sprays into the nose daily.  Dispense: 10 g; Refill: 12 3. Hypertension Probably stop diuretic on next visit due to gout.  Patient was seen and examined by Dr. Miguel Aschoff, and noted scribed by Webb Laws, [place] MD [facility] 03/25/2016 11:09 AM

## 2016-03-30 ENCOUNTER — Ambulatory Visit: Payer: Self-pay | Admitting: Family Medicine

## 2016-04-20 ENCOUNTER — Other Ambulatory Visit: Payer: Self-pay | Admitting: Family Medicine

## 2016-04-20 DIAGNOSIS — F329 Major depressive disorder, single episode, unspecified: Secondary | ICD-10-CM

## 2016-04-20 DIAGNOSIS — F32A Depression, unspecified: Secondary | ICD-10-CM

## 2016-05-14 ENCOUNTER — Other Ambulatory Visit: Payer: Self-pay | Admitting: Family Medicine

## 2016-05-17 ENCOUNTER — Other Ambulatory Visit: Payer: Self-pay | Admitting: Family Medicine

## 2016-05-17 ENCOUNTER — Other Ambulatory Visit: Payer: Self-pay

## 2016-05-17 MED ORDER — HYDRALAZINE HCL 25 MG PO TABS: 25.0000 mg | ORAL_TABLET | Freq: Three times a day (TID) | ORAL | 3 refills | Status: DC

## 2016-05-17 NOTE — Telephone Encounter (Signed)
Pt requesting refill on Hydralazine. States she is out and takes it TID. Renaldo Fiddler, CMA

## 2016-05-18 ENCOUNTER — Telehealth: Payer: Self-pay | Admitting: Family Medicine

## 2016-05-18 NOTE — Telephone Encounter (Signed)
Spoke with pharmacist and advised. KW

## 2016-05-18 NOTE — Telephone Encounter (Signed)
I see 25 mg.listed for the last year in Epic and in the office notes. Dr. Rosanna Randy says to leave her on the dose she has been on so I would stay on 25 mg.

## 2016-05-18 NOTE — Telephone Encounter (Signed)
Spoke with pharmacist on the phone who states that Dr.Gilbert usually prescribed this medication as 50mg  and you had prescribed it as Hydralazine 25mg  TID. Please confirm if you wanted to still authorize 25mg . Amparo Bristol

## 2016-05-18 NOTE — Telephone Encounter (Signed)
Yasmine at [facility] called to confirm dosage on Stephanie Charles's Hydralazine.  The dosage is not what she normally is prescribed.  Please advise pharmacy .  Thanks, [employer]

## 2016-05-28 ENCOUNTER — Other Ambulatory Visit: Payer: Self-pay | Admitting: Family Medicine

## 2016-05-29 ENCOUNTER — Other Ambulatory Visit: Payer: Self-pay | Admitting: Family Medicine

## 2016-05-29 DIAGNOSIS — R251 Tremor, unspecified: Secondary | ICD-10-CM

## 2016-06-02 ENCOUNTER — Other Ambulatory Visit: Payer: Self-pay

## 2016-06-02 DIAGNOSIS — R251 Tremor, unspecified: Secondary | ICD-10-CM

## 2016-06-02 DIAGNOSIS — I10 Essential (primary) hypertension: Secondary | ICD-10-CM

## 2016-06-02 MED ORDER — LOSARTAN POTASSIUM 100 MG PO TABS: 100.0000 mg | ORAL_TABLET | Freq: Every day | ORAL | 3 refills | Status: DC

## 2016-06-02 MED ORDER — PROPRANOLOL HCL 20 MG PO TABS: 20.0000 mg | ORAL_TABLET | Freq: Two times a day (BID) | ORAL | 1 refills | Status: DC

## 2016-06-02 MED ORDER — HYDRALAZINE HCL 50 MG PO TABS: 50.0000 mg | ORAL_TABLET | Freq: Three times a day (TID) | ORAL | 3 refills | Status: DC

## 2016-06-07 ENCOUNTER — Other Ambulatory Visit: Payer: Self-pay | Admitting: Family Medicine

## 2016-08-05 ENCOUNTER — Ambulatory Visit
Admission: RE | Admit: 2016-08-05 | Discharge: 2016-08-05 | Disposition: A | Discharge status: Home / Self Care | Payer: Medicare Other | Source: Ambulatory Visit | Attending: Family Medicine | Admitting: Family Medicine

## 2016-08-05 ENCOUNTER — Ambulatory Visit (INDEPENDENT_AMBULATORY_CARE_PROVIDER_SITE_OTHER): Payer: Medicare Other | Admitting: Family Medicine

## 2016-08-05 ENCOUNTER — Encounter: Payer: Self-pay | Admitting: Family Medicine

## 2016-08-05 VITALS — BP 132/58 | HR 98 | Temp 97.9°F | Resp 18 | Wt 171.0 lb

## 2016-08-05 DIAGNOSIS — R0609 Other forms of dyspnea: Secondary | ICD-10-CM | POA: Insufficient documentation

## 2016-08-05 DIAGNOSIS — E039 Hypothyroidism, unspecified: Secondary | ICD-10-CM

## 2016-08-05 DIAGNOSIS — R079 Chest pain, unspecified: Secondary | ICD-10-CM | POA: Diagnosis not present

## 2016-08-05 DIAGNOSIS — I10 Essential (primary) hypertension: Secondary | ICD-10-CM | POA: Diagnosis not present

## 2016-08-05 DIAGNOSIS — R195 Other fecal abnormalities: Secondary | ICD-10-CM | POA: Diagnosis not present

## 2016-08-05 DIAGNOSIS — I209 Angina pectoris, unspecified: Secondary | ICD-10-CM

## 2016-08-05 DIAGNOSIS — R0602 Shortness of breath: Secondary | ICD-10-CM | POA: Diagnosis not present

## 2016-08-05 DIAGNOSIS — C4491 Basal cell carcinoma of skin, unspecified: Secondary | ICD-10-CM

## 2016-08-05 DIAGNOSIS — Z23 Encounter for immunization: Secondary | ICD-10-CM

## 2016-08-05 DIAGNOSIS — K219 Gastro-esophageal reflux disease without esophagitis: Secondary | ICD-10-CM

## 2016-08-05 DIAGNOSIS — E785 Hyperlipidemia, unspecified: Secondary | ICD-10-CM

## 2016-08-05 LAB — IFOBT (OCCULT BLOOD): IFOBT: NEGATIVE

## 2016-08-05 MED ORDER — ISOSORBIDE MONONITRATE ER 30 MG PO TB24: 30.0000 mg | ORAL_TABLET | ORAL | 12 refills | Status: DC

## 2016-08-05 MED ORDER — RANITIDINE HCL 150 MG PO CAPS: 150.0000 mg | ORAL_CAPSULE | Freq: Two times a day (BID) | ORAL | 12 refills | Status: DC

## 2016-08-05 NOTE — Progress Notes (Signed)
Subjective:  Shortness of Breath   Pt is here today for shortness of breath. She reports that it has been going on for about 2 weeks. It is worse when going up the stairs and exertion. She reports that she has never had shortness of breath before. She also says that when she lays down at night that she can feel her heart beating and sometimes it is so hard that it keeps her up at night. She also reports that she has had dark/black stools. She denies any change in diet or abdominal pain. She also reports that she did not see any visible blood. She reports that she only saw the black stools for 3 days and they are almost back to normal colored stools now.   She also has a place on her inner right eye that she would like to have looked at. She has tried to get an appointment with dermatology but they all told her that she would have to pay full price with her insurance.  She also has a skin lesion at the top of her nose just under her right eye.  Prior to Admission medications   Medication Sig Start Date End Date Taking? Authorizing Provider  amitriptyline (ELAVIL) 10 MG tablet Take 2 tablets (20 mg total) by mouth at bedtime. 10/28/15   Orvell Careaga Maceo Pro., MD  amLODipine (NORVASC) 5 MG tablet TAKE ONE TABLET BY MOUTH ONCE DAILY 06/08/16   Jerrol Banana., MD  aspirin 81 MG tablet Take by mouth. 10/26/11   Historical Provider, MD  atorvastatin (LIPITOR) 40 MG tablet Take 1 tablet (40 mg total) by mouth daily. 02/26/16   Margarita Rana, MD  clopidogrel (PLAVIX) 75 MG tablet Take 1 tablet (75 mg total) by mouth daily. 11/26/15   Kainat Pizana Maceo Pro., MD  fluticasone (VERAMYST) 27.5 MCG/SPRAY nasal spray Place 2 sprays into the nose daily. 03/25/16   Eugene Isadore Maceo Pro., MD  hydrALAZINE (APRESOLINE) 50 MG tablet Take 1 tablet (50 mg total) by mouth 3 (three) times daily. 06/02/16   Jazel Nimmons Maceo Pro., MD  hydrochlorothiazide (HYDRODIURIL) 25 MG tablet TAKE ONE TABLET BY MOUTH ONCE DAILY  12/31/15   Jerrol Banana., MD  levothyroxine (SYNTHROID, LEVOTHROID) 137 MCG tablet TAKE ONE TABLET BY MOUTH ONCE DAILY 08/04/15   Jerrol Banana., MD  losartan (COZAAR) 100 MG tablet Take 1 tablet (100 mg total) by mouth daily. 06/02/16   Krystian Ferrentino Maceo Pro., MD  meclizine (ANTIVERT) 25 MG tablet TAKE ONE TABLET BY MOUTH THREE TIMES DAILY AS NEEDED FOR DIZZINESS 03/10/16   Jerrol Banana., MD  mirtazapine (REMERON) 30 MG tablet TAKE ONE TABLET BY MOUTH ONCE DAILY 08/04/15   Jerrol Banana., MD  Multiple Vitamin (MULTIVITAMIN) tablet Take 1 tablet by mouth daily.    Historical Provider, MD  propranolol (INDERAL) 20 MG tablet Take 1 tablet (20 mg total) by mouth 2 (two) times daily. Patient to use up to two times per day as needed for tremor only 06/02/16   Valisa Karpel L Cranford Mon., MD  sertraline (ZOLOFT) 25 MG tablet TAKE ONE TABLET BY MOUTH ONCE DAILY 04/20/16   Jerrol Banana., MD    Patient Active Problem List   Diagnosis Date Noted  . Elevated troponin I level 08/25/2015  . Abnormal blood sugar 03/11/2015  . Allergic rhinitis 03/11/2015  . Absolute anemia 03/11/2015  . Arthropathia 03/11/2015  . Benign essential HTN 03/11/2015  .  Benign essential tremor 03/11/2015  . Artery disease, cerebral 03/11/2015  . Chronic venous insufficiency 03/11/2015  . Clinical depression 03/11/2015  . Urinary system disease 03/11/2015  . Acid reflux 03/11/2015  . HLD (hyperlipidemia) 03/11/2015  . Adult hypothyroidism 03/11/2015  . Irritable colon 03/11/2015  . Cannot sleep 03/11/2015  . Weak pulse 03/11/2015  . Carotid stenosis 03/11/2015  . Temporary cerebral vascular dysfunction 03/11/2015  . External carotid artery stenosis 07/12/2014    History reviewed. No pertinent past medical history.  Social History   Social History  . Marital status: Divorced    Spouse name: na  . Number of children: 4  . Years of education: 14   Occupational History  . retired     Social History Main Topics  . Smoking status: Never Smoker  . Smokeless tobacco: Never Used  . Alcohol use No  . Drug use: No  . Sexual activity: No   Other Topics Concern  . Not on file   Social History Narrative  . No narrative on file    Allergies  Allergen Reactions  . Codeine   . Levofloxacin     Mouth sores  . Simvastatin     Myalgias    Review of Systems  Constitutional: Positive for malaise/fatigue.  HENT: Negative.   Eyes: Negative.   Respiratory: Positive for shortness of breath.   Gastrointestinal: Negative.        Dark stools but pt did not see any blood  Genitourinary: Negative.   Musculoskeletal: Negative.   Skin: Negative.        Mole skin lesion on top of right nose under right eye  Neurological: Negative.   Endo/Heme/Allergies: Negative.   Psychiatric/Behavioral: Negative.     Immunization History  Administered Date(s) Administered  . Influenza, High Dose Seasonal PF 07/03/2015  . Pneumococcal Conjugate-13 07/03/2015  . Pneumococcal Polysaccharide-23 06/20/2013  . Tdap 02/23/2012  . Zoster 08/03/2011   Objective:  BP (!) 132/58 (BP Location: Left Arm, Patient Position: Sitting, Cuff Size: Normal)   Pulse 98   Temp 97.9 F (36.6 C) (Oral)   Resp 18   Wt 171 lb (77.6 kg)   SpO2 96%   BMI 31.28 kg/m   Physical Exam  Constitutional: She is oriented to person, place, and time and well-developed, well-nourished, and in no distress.  HENT:  Head: Normocephalic and atraumatic.  Eyes: Conjunctivae and EOM are normal. Pupils are equal, round, and reactive to light.  Neck: Normal range of motion. Neck supple.  Cardiovascular: Normal rate, regular rhythm, normal heart sounds and intact distal pulses.   Pulmonary/Chest: Effort normal and breath sounds normal.  Abdominal: Soft.  Genitourinary: Rectal exam shows guaiac negative stool.  Genitourinary Comments: DRE  normal.  Musculoskeletal: She exhibits no tenderness.  Neurological: She is  alert and oriented to person, place, and time. She has normal reflexes. Gait normal. GCS score is 15.  Skin: Skin is warm and dry.  Small lesion of right nose on her right eye with what appears to be a small crater in the middle  Psychiatric: Mood, memory, affect and judgment normal.    Lab Results  Component Value Date   WBC 7.9 12/06/2014   HGB 14.0 12/06/2014   HCT 40 12/06/2014   PLT 180 12/06/2014   GLUCOSE 112 (H) 03/11/2015   CHOL 204 (A) 12/06/2014   TRIG 241 (A) 12/06/2014   HDL 31 (A) 12/06/2014   LDLCALC 125 12/06/2014   TSH 0.84 12/06/2014   HGBA1C 5.5  10/19/2011    CMP     Component Value Date/Time   NA 143 03/11/2015 1446   NA 133 (L) 04/26/2014 1141   K 3.7 03/11/2015 1446   K 3.9 04/26/2014 1141   CL 98 03/11/2015 1446   CL 99 04/26/2014 1141   CO2 28 03/11/2015 1446   CO2 27 04/26/2014 1141   GLUCOSE 112 (H) 03/11/2015 1446   GLUCOSE 89 04/26/2014 1141   BUN 19 03/11/2015 1446   BUN 19 (H) 04/26/2014 1141   CREATININE 1.01 (H) 03/11/2015 1446   CREATININE 1.20 04/26/2014 1141   CALCIUM 9.8 03/11/2015 1446   CALCIUM 8.8 04/26/2014 1141   PROT 7.4 04/26/2014 1141   ALBUMIN 4.6 03/11/2015 1446   ALBUMIN 3.8 04/26/2014 1141   AST 25 12/06/2014   AST 22 04/26/2014 1141   ALT 19 12/06/2014   ALT 29 04/26/2014 1141   ALKPHOS 83 12/06/2014   ALKPHOS 92 04/26/2014 1141   BILITOT 0.5 04/26/2014 1141   GFRNONAA 52 (L) 03/11/2015 1446   GFRNONAA 42 (L) 04/26/2014 1141   GFRAA 60 03/11/2015 1446   GFRAA 49 (L) 04/26/2014 1141    Assessment and Plan :  1. Dyspnea on exertion  - EKG 12-Lead - Pro b natriuretic peptide (BNP) - Troponin I - Ambulatory referral to Cardiology - DG Chest 2 View; Future  2. Basal cell carcinoma, unspecified site Possible. Inferior right eye - Ambulatory referral to Dermatology  3. Angina pectoris ([place]) Probable new left bundle-branch block Is possible this is angina. - Pro b natriuretic peptide (BNP) - Troponin  I - isosorbide mononitrate (IMDUR) 30 MG 24 hr tablet; Take 1 tablet (30 mg total) by mouth every morning.  Dispense: 30 tablet; Refill: 12  4. Essential hypertension  - CBC with Differential/Platelet - TSH  5. Adult hypothyroidism  - TSH  6. Hyperlipidemia, unspecified hyperlipidemia type  - Comprehensive metabolic panel  7. Need for influenza vaccination  - Flu vaccine HIGH DOSE PF  8. Dark stools  - IFOBT POC (occult bld, rslt in office)  9. Gastroesophageal reflux disease, esophagitis presence not specified  - ranitidine (ZANTAC) 150 MG capsule; Take 1 capsule (150 mg total) by mouth 2 (two) times daily.  Dispense: 60 capsule; Refill: 12   HPI, Exam, and A&P Transcribed under the direction and in the presence of Alfred Harrel L. Cranford Mon, MD  Electronically Signed: Webb Laws, CMA I have done the exam and reviewed the above chart and it is accurate to the best of my knowledge.  Miguel Aschoff MD [facility] 08/05/2016 2:41 PM

## 2016-08-06 DIAGNOSIS — R0602 Shortness of breath: Secondary | ICD-10-CM | POA: Diagnosis not present

## 2016-08-06 DIAGNOSIS — R0789 Other chest pain: Secondary | ICD-10-CM | POA: Diagnosis not present

## 2016-08-06 DIAGNOSIS — R002 Palpitations: Secondary | ICD-10-CM | POA: Diagnosis not present

## 2016-08-06 DIAGNOSIS — I10 Essential (primary) hypertension: Secondary | ICD-10-CM | POA: Diagnosis not present

## 2016-08-06 LAB — COMPREHENSIVE METABOLIC PANEL
A/G RATIO: 2 (ref 1.2–2.2)
ALT: 27 IU/L (ref 0–32)
AST: 30 IU/L (ref 0–40)
Albumin: 4.3 g/dL (ref 3.5–4.7)
Alkaline Phosphatase: 120 IU/L — ABNORMAL HIGH (ref 39–117)
BUN/Creatinine Ratio: 14 (ref 12–28)
BUN: 17 mg/dL (ref 8–27)
Bilirubin Total: 0.3 mg/dL (ref 0.0–1.2)
CALCIUM: 9.3 mg/dL (ref 8.7–10.3)
CO2: 23 mmol/L (ref 18–29)
Chloride: 98 mmol/L (ref 96–106)
Creatinine, Ser: 1.18 mg/dL — ABNORMAL HIGH (ref 0.57–1.00)
GFR, EST AFRICAN AMERICAN: 49 mL/min/{1.73_m2} — AB (ref >59)
GFR, EST NON AFRICAN AMERICAN: 43 mL/min/{1.73_m2} — AB (ref >59)
GLOBULIN, TOTAL: 2.2 g/dL (ref 1.5–4.5)
Glucose: 120 mg/dL — ABNORMAL HIGH (ref 65–99)
POTASSIUM: 3.1 mmol/L — AB (ref 3.5–5.2)
SODIUM: 142 mmol/L (ref 134–144)
TOTAL PROTEIN: 6.5 g/dL (ref 6.0–8.5)

## 2016-08-06 LAB — TSH: TSH: 5.44 u[IU]/mL — ABNORMAL HIGH (ref 0.450–4.500)

## 2016-08-06 LAB — CBC WITH DIFFERENTIAL/PLATELET
BASOS: 0 %
Basophils Absolute: 0 10*3/uL (ref 0.0–0.2)
EOS (ABSOLUTE): 0.2 10*3/uL (ref 0.0–0.4)
Eos: 1 %
HEMATOCRIT: 30.5 % — AB (ref 34.0–46.6)
HEMOGLOBIN: 10.7 g/dL — AB (ref 11.1–15.9)
IMMATURE GRANS (ABS): 0 10*3/uL (ref 0.0–0.1)
Immature Granulocytes: 0 %
LYMPHS: 20 %
Lymphocytes Absolute: 2.4 10*3/uL (ref 0.7–3.1)
MCH: 31.4 pg (ref 26.6–33.0)
MCHC: 35.1 g/dL (ref 31.5–35.7)
MCV: 89 fL (ref 79–97)
MONOCYTES: 4 %
Monocytes Absolute: 0.5 10*3/uL (ref 0.1–0.9)
NEUTROS ABS: 8.9 10*3/uL — AB (ref 1.4–7.0)
Neutrophils: 75 %
Platelets: 262 10*3/uL (ref 150–379)
RBC: 3.41 x10E6/uL — ABNORMAL LOW (ref 3.77–5.28)
RDW: 14.9 % (ref 12.3–15.4)
WBC: 12.1 10*3/uL — ABNORMAL HIGH (ref 3.4–10.8)

## 2016-08-06 LAB — TROPONIN I: Troponin I: <0.01 ng/mL (ref 0.00–0.04)

## 2016-08-06 LAB — PRO B NATRIURETIC PEPTIDE: NT-Pro BNP: 120 pg/mL (ref 0–738)

## 2016-08-09 ENCOUNTER — Telehealth: Payer: Self-pay | Admitting: Family Medicine

## 2016-08-09 DIAGNOSIS — L570 Actinic keratosis: Secondary | ICD-10-CM | POA: Diagnosis not present

## 2016-08-09 DIAGNOSIS — L918 Other hypertrophic disorders of the skin: Secondary | ICD-10-CM | POA: Diagnosis not present

## 2016-08-09 DIAGNOSIS — Z111 Encounter for screening for respiratory tuberculosis: Secondary | ICD-10-CM

## 2016-08-09 NOTE — Telephone Encounter (Signed)
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

## 2016-08-09 NOTE — Telephone Encounter (Signed)
Pt advised on voicemail and lab slip printed-aa

## 2016-08-09 NOTE — Telephone Encounter (Signed)
ok 

## 2016-08-09 NOTE — Telephone Encounter (Signed)
Please advise-aa 

## 2016-08-12 ENCOUNTER — Encounter: Payer: Self-pay | Admitting: Family Medicine

## 2016-08-12 ENCOUNTER — Ambulatory Visit (INDEPENDENT_AMBULATORY_CARE_PROVIDER_SITE_OTHER): Payer: Medicare Other | Admitting: Family Medicine

## 2016-08-12 VITALS — BP 124/62 | HR 70 | Temp 97.8°F | Resp 16 | Wt 172.0 lb

## 2016-08-12 DIAGNOSIS — D649 Anemia, unspecified: Secondary | ICD-10-CM | POA: Diagnosis not present

## 2016-08-12 DIAGNOSIS — I10 Essential (primary) hypertension: Secondary | ICD-10-CM | POA: Diagnosis not present

## 2016-08-12 DIAGNOSIS — N390 Urinary tract infection, site not specified: Secondary | ICD-10-CM

## 2016-08-12 DIAGNOSIS — E876 Hypokalemia: Secondary | ICD-10-CM

## 2016-08-12 DIAGNOSIS — R0609 Other forms of dyspnea: Secondary | ICD-10-CM | POA: Diagnosis not present

## 2016-08-12 DIAGNOSIS — R102 Pelvic and perineal pain: Secondary | ICD-10-CM

## 2016-08-12 DIAGNOSIS — R5383 Other fatigue: Secondary | ICD-10-CM | POA: Diagnosis not present

## 2016-08-12 LAB — POCT URINALYSIS DIPSTICK
BILIRUBIN UA: NEGATIVE
GLUCOSE UA: NEGATIVE
Ketones, UA: NEGATIVE
Nitrite, UA: NEGATIVE
Protein, UA: NEGATIVE
RBC UA: NEGATIVE
SPEC GRAV UA: 1.01
Urobilinogen, UA: 0.2
pH, UA: 6

## 2016-08-12 NOTE — Progress Notes (Signed)
Subjective:  HPI Pt is here for a follow up of borderline anemia. Pt is still having fatigue and is short of breath. She has seen cardiology and they ordered a stress test and Holter monitor. She has not gotten results back from these yet. Pt denies any blood in urine or stool. She is feeling a little bit better but it is just following up on these symptoms to make sure she needs no further evaluation. She is having no neurologic symptoms at all. Presently no GI symptoms. Weight issues are fatigue and dyspnea on exertion. Prior to Admission medications   Medication Sig Start Date End Date Taking? Authorizing Provider  amitriptyline (ELAVIL) 10 MG tablet Take 2 tablets (20 mg total) by mouth at bedtime. 10/28/15   Julyanna Scholle Maceo Pro., MD  amLODipine (NORVASC) 5 MG tablet TAKE ONE TABLET BY MOUTH ONCE DAILY 06/08/16   Jerrol Banana., MD  aspirin 81 MG tablet Take by mouth. 10/26/11   Historical Provider, MD  atorvastatin (LIPITOR) 40 MG tablet Take 1 tablet (40 mg total) by mouth daily. 02/26/16   Margarita Rana, MD  clopidogrel (PLAVIX) 75 MG tablet Take 1 tablet (75 mg total) by mouth daily. 11/26/15   Hagan Vanauken Maceo Pro., MD  fluticasone (VERAMYST) 27.5 MCG/SPRAY nasal spray Place 2 sprays into the nose daily. 03/25/16   Geoffry Bannister Maceo Pro., MD  hydrALAZINE (APRESOLINE) 50 MG tablet Take 1 tablet (50 mg total) by mouth 3 (three) times daily. 06/02/16   Jerrol Banana., MD  hydrochlorothiazide (HYDRODIURIL) 25 MG tablet TAKE ONE TABLET BY MOUTH ONCE DAILY 12/31/15   Jerrol Banana., MD  isosorbide mononitrate (IMDUR) 30 MG 24 hr tablet Take 1 tablet (30 mg total) by mouth every morning. 08/05/16   Delonda Coley Maceo Pro., MD  levothyroxine (SYNTHROID, LEVOTHROID) 137 MCG tablet TAKE ONE TABLET BY MOUTH ONCE DAILY 08/04/15   Jerrol Banana., MD  losartan (COZAAR) 100 MG tablet Take 1 tablet (100 mg total) by mouth daily. 06/02/16   Bryanah Sidell Maceo Pro., MD  meclizine  (ANTIVERT) 25 MG tablet TAKE ONE TABLET BY MOUTH THREE TIMES DAILY AS NEEDED FOR DIZZINESS 03/10/16   Jerrol Banana., MD  mirtazapine (REMERON) 30 MG tablet TAKE ONE TABLET BY MOUTH ONCE DAILY 08/04/15   Jerrol Banana., MD  Multiple Vitamin (MULTIVITAMIN) tablet Take 1 tablet by mouth daily.    Historical Provider, MD  propranolol (INDERAL) 20 MG tablet Take 1 tablet (20 mg total) by mouth 2 (two) times daily. Patient to use up to two times per day as needed for tremor only 06/02/16   Lener Ventresca L Cranford Mon., MD  ranitidine (ZANTAC) 150 MG capsule Take 1 capsule (150 mg total) by mouth 2 (two) times daily. 08/05/16   Kenshawn Maciolek Maceo Pro., MD  sertraline (ZOLOFT) 25 MG tablet TAKE ONE TABLET BY MOUTH ONCE DAILY 04/20/16   Jerrol Banana., MD    Patient Active Problem List   Diagnosis Date Noted  . Elevated troponin I level 08/25/2015  . Abnormal blood sugar 03/11/2015  . Allergic rhinitis 03/11/2015  . Absolute anemia 03/11/2015  . Arthropathia 03/11/2015  . Benign essential HTN 03/11/2015  . Benign essential tremor 03/11/2015  . Artery disease, cerebral 03/11/2015  . Chronic venous insufficiency 03/11/2015  . Clinical depression 03/11/2015  . Urinary system disease 03/11/2015  . Acid reflux 03/11/2015  . HLD (hyperlipidemia) 03/11/2015  . Adult hypothyroidism 03/11/2015  .  Irritable colon 03/11/2015  . Cannot sleep 03/11/2015  . Weak pulse 03/11/2015  . Carotid stenosis 03/11/2015  . Temporary cerebral vascular dysfunction 03/11/2015  . External carotid artery stenosis 07/12/2014    History reviewed. No pertinent past medical history.  Social History   Social History  . Marital status: Divorced    Spouse name: na  . Number of children: 4  . Years of education: 14   Occupational History  . retired    Social History Main Topics  . Smoking status: Never Smoker  . Smokeless tobacco: Never Used  . Alcohol use No  . Drug use: No  . Sexual activity: No    Other Topics Concern  . Not on file   Social History Narrative  . No narrative on file    Allergies  Allergen Reactions  . Codeine   . Levofloxacin     Mouth sores  . Simvastatin     Myalgias    Review of Systems  Constitutional: Positive for malaise/fatigue.  Eyes: Negative.   Respiratory: Positive for shortness of breath.   Cardiovascular: Negative.   Gastrointestinal: Negative.   Neurological: Negative.   Endo/Heme/Allergies: Negative.   Psychiatric/Behavioral: Negative.     Immunization History  Administered Date(s) Administered  . Influenza, High Dose Seasonal PF 07/03/2015, 08/05/2016  . Pneumococcal Conjugate-13 07/03/2015  . Pneumococcal Polysaccharide-23 06/20/2013  . Tdap 02/23/2012  . Zoster 08/03/2011    Objective:  BP 124/62 (BP Location: Left Arm, Patient Position: Sitting, Cuff Size: Large)   Pulse 70   Temp 97.8 F (36.6 C) (Oral)   Resp 16   Wt 172 lb (78 kg)   BMI 31.46 kg/m   Physical Exam  Constitutional: She is oriented to person, place, and time and well-developed, well-nourished, and in no distress.  HENT:  Head: Normocephalic and atraumatic.  Right Ear: External ear normal.  Left Ear: External ear normal.  Nose: Nose normal.  Eyes: Conjunctivae and EOM are normal. Pupils are equal, round, and reactive to light.  Neck: Normal range of motion. Neck supple.  Soft right carotid bruit  Cardiovascular: Normal rate, regular rhythm, normal heart sounds and intact distal pulses.   Pulmonary/Chest: Effort normal and breath sounds normal.  Abdominal: Soft. Bowel sounds are normal. There is no tenderness.  Musculoskeletal: Normal range of motion.  Neurological: She is alert and oriented to person, place, and time. She has normal reflexes. Gait normal. GCS score is 15.  Skin: Skin is warm and dry.  Psychiatric: Mood, memory, affect and judgment normal.    Lab Results  Component Value Date   WBC 12.1 (H) 08/05/2016   HGB 14.0 12/06/2014    HCT 30.5 (L) 08/05/2016   PLT 262 08/05/2016   GLUCOSE 120 (H) 08/05/2016   CHOL 204 (A) 12/06/2014   TRIG 241 (A) 12/06/2014   HDL 31 (A) 12/06/2014   LDLCALC 125 12/06/2014   TSH 5.440 (H) 08/05/2016   HGBA1C 5.5 10/19/2011    CMP     Component Value Date/Time   NA 142 08/05/2016 1547   NA 133 (L) 04/26/2014 1141   K 3.1 (L) 08/05/2016 1547   K 3.9 04/26/2014 1141   CL 98 08/05/2016 1547   CL 99 04/26/2014 1141   CO2 23 08/05/2016 1547   CO2 27 04/26/2014 1141   GLUCOSE 120 (H) 08/05/2016 1547   GLUCOSE 89 04/26/2014 1141   BUN 17 08/05/2016 1547   BUN 19 (H) 04/26/2014 1141   CREATININE 1.18 (H) 08/05/2016  1547   CREATININE 1.20 04/26/2014 1141   CALCIUM 9.3 08/05/2016 1547   CALCIUM 8.8 04/26/2014 1141   PROT 6.5 08/05/2016 1547   PROT 7.4 04/26/2014 1141   ALBUMIN 4.3 08/05/2016 1547   ALBUMIN 3.8 04/26/2014 1141   AST 30 08/05/2016 1547   AST 22 04/26/2014 1141   ALT 27 08/05/2016 1547   ALT 29 04/26/2014 1141   ALKPHOS 120 (H) 08/05/2016 1547   ALKPHOS 92 04/26/2014 1141   BILITOT 0.3 08/05/2016 1547   BILITOT 0.5 04/26/2014 1141   GFRNONAA 43 (L) 08/05/2016 1547   GFRNONAA 42 (L) 04/26/2014 1141   GFRAA 49 (L) 08/05/2016 1547   GFRAA 49 (L) 04/26/2014 1141    Assessment and Plan :  1. Borderline anemia 50% of at least 25 minute visit is spent in counseling regarding the evaluation of these issues. - CBC with Differential/Platelet - Iron and TIBC  2. Dyspnea on exertion Cardiology is working the patient up presently. Stress test and Holter monitor pending  3. Other fatigue   4. Pelvic pain Recent symptom. This is very mild. Likely a resolving cystitis - POCT urinalysis dipstick  5. Urinary tract infection without hematuria, site unspecified  - Urine culture  6. Hypokalemia  - Comprehensive metabolic panel  7. Benign essential HTN  8.Depression, mild  9 Gen. anxiety disorder, moderate HPI, Exam, and A&P Transcribed under the  direction and in the presence of Louise Rawson L. Cranford Mon, MD  Electronically Signed: Webb Laws, CMA I have done the exam and reviewed the above chart and it is accurate to the best of my knowledge. Development worker, community has been used in this note in any air is in the dictation or transcription are unintentional.  [facility] 08/12/2016 11:18 AM

## 2016-08-13 ENCOUNTER — Telehealth: Payer: Self-pay

## 2016-08-13 LAB — CBC WITH DIFFERENTIAL/PLATELET
BASOS ABS: 0 10*3/uL (ref 0.0–0.2)
Basos: 0 %
EOS (ABSOLUTE): 0.2 10*3/uL (ref 0.0–0.4)
EOS: 2 %
Hematocrit: 32.8 % — ABNORMAL LOW (ref 34.0–46.6)
Hemoglobin: 10.7 g/dL — ABNORMAL LOW (ref 11.1–15.9)
IMMATURE GRANS (ABS): 0 10*3/uL (ref 0.0–0.1)
IMMATURE GRANULOCYTES: 0 %
Lymphocytes Absolute: 2.7 10*3/uL (ref 0.7–3.1)
Lymphs: 29 %
MCH: 30.1 pg (ref 26.6–33.0)
MCHC: 32.6 g/dL (ref 31.5–35.7)
MCV: 92 fL (ref 79–97)
MONOCYTES: 8 %
MONOS ABS: 0.7 10*3/uL (ref 0.1–0.9)
NEUTROS ABS: 5.8 10*3/uL (ref 1.4–7.0)
Neutrophils: 61 %
PLATELETS: 212 10*3/uL (ref 150–379)
RBC: 3.55 x10E6/uL — AB (ref 3.77–5.28)
RDW: 14.4 % (ref 12.3–15.4)
WBC: 9.5 10*3/uL (ref 3.4–10.8)

## 2016-08-13 LAB — IRON AND TIBC
IRON SATURATION: 22 % (ref 15–55)
IRON: 71 ug/dL (ref 27–139)
TIBC: 320 ug/dL (ref 250–450)
UIBC: 249 ug/dL (ref 118–369)

## 2016-08-13 LAB — COMPREHENSIVE METABOLIC PANEL
A/G RATIO: 1.8 (ref 1.2–2.2)
ALT: 30 IU/L (ref 0–32)
AST: 30 IU/L (ref 0–40)
Albumin: 4.2 g/dL (ref 3.5–4.7)
Alkaline Phosphatase: 124 IU/L — ABNORMAL HIGH (ref 39–117)
BILIRUBIN TOTAL: 0.4 mg/dL (ref 0.0–1.2)
BUN/Creatinine Ratio: 15 (ref 12–28)
BUN: 17 mg/dL (ref 8–27)
CALCIUM: 9.3 mg/dL (ref 8.7–10.3)
CHLORIDE: 100 mmol/L (ref 96–106)
CO2: 26 mmol/L (ref 18–29)
Creatinine, Ser: 1.14 mg/dL — ABNORMAL HIGH (ref 0.57–1.00)
GFR calc Af Amer: 51 mL/min/{1.73_m2} — ABNORMAL LOW (ref >59)
GFR, EST NON AFRICAN AMERICAN: 45 mL/min/{1.73_m2} — AB (ref >59)
GLOBULIN, TOTAL: 2.4 g/dL (ref 1.5–4.5)
Glucose: 88 mg/dL (ref 65–99)
POTASSIUM: 3.5 mmol/L (ref 3.5–5.2)
SODIUM: 143 mmol/L (ref 134–144)
Total Protein: 6.6 g/dL (ref 6.0–8.5)

## 2016-08-13 NOTE — Telephone Encounter (Signed)
LMTCB 08/13/2016  Thanks,   -Mickel Baas

## 2016-08-13 NOTE — Telephone Encounter (Signed)
-----   Message from Jerrol Banana., MD sent at 08/13/2016  7:17 AM EST ----- Labs, including anemia is stable. Repeat CBC/iron  one month

## 2016-08-14 LAB — URINE CULTURE: ORGANISM ID, BACTERIA: NO GROWTH

## 2016-08-14 NOTE — Telephone Encounter (Signed)
-----   Message from Jerrol Banana., MD sent at 08/14/2016  9:30 AM EST ----- No UTI.

## 2016-08-14 NOTE — Telephone Encounter (Signed)
LMTCB

## 2016-08-16 NOTE — Telephone Encounter (Signed)
Patient has been advised of lab report and urine specimen. KW

## 2016-08-17 DIAGNOSIS — R002 Palpitations: Secondary | ICD-10-CM | POA: Diagnosis not present

## 2016-08-18 ENCOUNTER — Other Ambulatory Visit: Payer: Self-pay | Admitting: Family Medicine

## 2016-08-30 ENCOUNTER — Other Ambulatory Visit: Payer: Self-pay | Admitting: Family Medicine

## 2016-09-01 DIAGNOSIS — I10 Essential (primary) hypertension: Secondary | ICD-10-CM | POA: Diagnosis not present

## 2016-09-01 DIAGNOSIS — I771 Stricture of artery: Secondary | ICD-10-CM | POA: Diagnosis not present

## 2016-09-01 DIAGNOSIS — E785 Hyperlipidemia, unspecified: Secondary | ICD-10-CM | POA: Diagnosis not present

## 2016-09-01 DIAGNOSIS — I252 Old myocardial infarction: Secondary | ICD-10-CM | POA: Diagnosis not present

## 2016-09-01 DIAGNOSIS — Z8673 Personal history of transient ischemic attack (TIA), and cerebral infarction without residual deficits: Secondary | ICD-10-CM | POA: Diagnosis not present

## 2016-09-01 DIAGNOSIS — I6523 Occlusion and stenosis of bilateral carotid arteries: Secondary | ICD-10-CM | POA: Diagnosis not present

## 2016-09-01 DIAGNOSIS — G459 Transient cerebral ischemic attack, unspecified: Secondary | ICD-10-CM | POA: Diagnosis not present

## 2016-09-01 DIAGNOSIS — I6529 Occlusion and stenosis of unspecified carotid artery: Secondary | ICD-10-CM | POA: Diagnosis not present

## 2016-09-02 ENCOUNTER — Encounter: Payer: Self-pay | Admitting: Physician Assistant

## 2016-09-02 ENCOUNTER — Ambulatory Visit (INDEPENDENT_AMBULATORY_CARE_PROVIDER_SITE_OTHER): Payer: Medicare Other | Admitting: Physician Assistant

## 2016-09-02 VITALS — BP 128/62 | HR 88 | Temp 97.3°F | Resp 16 | Wt 172.0 lb

## 2016-09-02 DIAGNOSIS — J069 Acute upper respiratory infection, unspecified: Secondary | ICD-10-CM

## 2016-09-02 DIAGNOSIS — R059 Cough, unspecified: Secondary | ICD-10-CM

## 2016-09-02 DIAGNOSIS — B9789 Other viral agents as the cause of diseases classified elsewhere: Secondary | ICD-10-CM

## 2016-09-02 DIAGNOSIS — R05 Cough: Secondary | ICD-10-CM

## 2016-09-02 MED ORDER — DEXTROMETHORPHAN POLISTIREX ER 30 MG/5ML PO SUER: 30.0000 mg | ORAL | 0 refills | Status: DC | PRN

## 2016-09-02 NOTE — Progress Notes (Signed)
[place]  Chief Complaint  Patient presents with  . URI    Started about five days ago.   . Sinusitis    Subjective:    Patient ID: Stephanie Charles, female    DOB: September 06, 1930, 82 y.o.   MRN: [id number]  Upper Respiratory Infection: AVO VOCI is a 82 y.o. female with a past medical history significant for Allergic Rhinits, Hypertension, complaining of symptoms of a URI, possible sinusitis. Symptoms include bilateral ear pain, congestion and cough. Onset of symptoms was 5 days ago, gradually worsening since that time. She also c/o congestion, cough described as productive, nasal congestion and post nasal drip for the past 5 days .  She is drinking plenty of fluids. Evaluation to date: none. Treatment to date: cough suppressants. Has tried coricidin with no relief.   Review of Systems  Constitutional: Positive for fatigue. Negative for activity change, appetite change, chills, diaphoresis and fever.  HENT: Positive for congestion, ear pain, postnasal drip, rhinorrhea, sinus pain and sinus pressure. Negative for ear discharge, nosebleeds, sneezing, sore throat, tinnitus, trouble swallowing and voice change.   Eyes: Positive for pain, discharge and itching. Negative for photophobia, redness and visual disturbance.  Respiratory: Positive for cough, chest tightness, shortness of breath and wheezing. Negative for apnea, choking and stridor.   Gastrointestinal: Negative.   Musculoskeletal: Positive for myalgias. Negative for arthralgias and back pain.  Neurological: Negative for dizziness, seizures and headaches.       Objective:   BP 128/62 (BP Location: Left Arm, Patient Position: Sitting, Cuff Size: Large)   Pulse 88   Temp 97.3 F (36.3 C) (Oral)   Resp 16   Wt 172 lb (78 kg)   BMI 31.46 kg/m   Patient Active Problem List   Diagnosis Date Noted  . Elevated troponin I level 08/25/2015  . Abnormal blood sugar 03/11/2015  . Allergic rhinitis 03/11/2015  .  Absolute anemia 03/11/2015  . Arthropathia 03/11/2015  . Benign essential HTN 03/11/2015  . Benign essential tremor 03/11/2015  . Artery disease, cerebral 03/11/2015  . Chronic venous insufficiency 03/11/2015  . Clinical depression 03/11/2015  . Urinary system disease 03/11/2015  . Acid reflux 03/11/2015  . HLD (hyperlipidemia) 03/11/2015  . Adult hypothyroidism 03/11/2015  . Irritable colon 03/11/2015  . Cannot sleep 03/11/2015  . Weak pulse 03/11/2015  . Carotid stenosis 03/11/2015  . Temporary cerebral vascular dysfunction 03/11/2015  . External carotid artery stenosis 07/12/2014    Outpatient Encounter Prescriptions as of 09/02/2016  Medication Sig Note  . allopurinol (ZYLOPRIM) 300 MG tablet TAKE ONE TABLET BY MOUTH ONCE DAILY   . amitriptyline (ELAVIL) 10 MG tablet Take 2 tablets (20 mg total) by mouth at bedtime.   Marland Kitchen amLODipine (NORVASC) 5 MG tablet TAKE ONE TABLET BY MOUTH ONCE DAILY   . amLODipine (NORVASC) 5 MG tablet TAKE ONE TABLET BY MOUTH ONCE DAILY   . aspirin 81 MG tablet Take by mouth. 03/11/2015: Received from: [facility]  . atorvastatin (LIPITOR) 40 MG tablet TAKE ONE TABLET BY MOUTH ONCE DAILY   . clopidogrel (PLAVIX) 75 MG tablet Take 1 tablet (75 mg total) by mouth daily.   . fluticasone (VERAMYST) 27.5 MCG/SPRAY nasal spray Place 2 sprays into the nose daily.   . hydrALAZINE (APRESOLINE) 50 MG tablet Take 1 tablet (50 mg total) by mouth 3 (three) times daily.   . hydrochlorothiazide (HYDRODIURIL) 25 MG tablet TAKE ONE TABLET BY MOUTH ONCE DAILY   . isosorbide mononitrate (IMDUR) 30  MG 24 hr tablet Take 1 tablet (30 mg total) by mouth every morning.   Marland Kitchen levothyroxine (SYNTHROID, LEVOTHROID) 137 MCG tablet TAKE ONE TABLET BY MOUTH ONCE DAILY   . losartan (COZAAR) 100 MG tablet Take 1 tablet (100 mg total) by mouth daily.   . meclizine (ANTIVERT) 25 MG tablet TAKE ONE TABLET BY MOUTH THREE TIMES DAILY AS NEEDED FOR DIZZINESS   . mirtazapine  (REMERON) 30 MG tablet TAKE ONE TABLET BY MOUTH ONCE DAILY   . Multiple Vitamin (MULTIVITAMIN) tablet Take 1 tablet by mouth daily.   . propranolol (INDERAL) 20 MG tablet Take 1 tablet (20 mg total) by mouth 2 (two) times daily. Patient to use up to two times per day as needed for tremor only   . ranitidine (ZANTAC) 150 MG capsule Take 1 capsule (150 mg total) by mouth 2 (two) times daily.   . sertraline (ZOLOFT) 25 MG tablet TAKE ONE TABLET BY MOUTH ONCE DAILY   . dextromethorphan (ROBITUSSIN 12 HOUR COUGH) 30 MG/5ML liquid Take 5 mLs (30 mg total) by mouth as needed for cough.    No facility-administered encounter medications on file as of 09/02/2016.     Allergies  Allergen Reactions  . Codeine   . Levofloxacin     Mouth sores  . Simvastatin     Myalgias       Physical Exam  Constitutional: She appears well-developed and well-nourished.  HENT:  Right Ear: External ear normal.  Left Ear: External ear normal.  Mouth/Throat: Oropharynx is clear and moist. No oropharyngeal exudate.  Eyes: Right eye exhibits discharge. Left eye exhibits discharge.  Neck: Neck supple.  Cardiovascular: Normal rate and regular rhythm.   Pulmonary/Chest: Effort normal and breath sounds normal. No respiratory distress. She has no wheezes. She has no rales.  Lymphadenopathy:    She has no cervical adenopathy.  Skin: Skin is warm and dry.  Psychiatric: She has a normal mood and affect. Her behavior is normal.       Assessment & Plan:   Problem List Items Addressed This Visit    None    Visit Diagnoses    Cough    -  Primary   Relevant Medications   dextromethorphan (ROBITUSSIN 12 HOUR COUGH) 30 MG/5ML liquid   Viral upper respiratory infection         Problem List Items Addressed This Visit    None    Visit Diagnoses    Cough    -  Primary   Relevant Medications   dextromethorphan (ROBITUSSIN 12 HOUR COUGH) 30 MG/5ML liquid   Viral upper respiratory infection         Patient is an  82 y/o female presenting with URI, likely viral at this point. Will treat symptomatically with Robitussin. Advised patient against Sudafed, anything "cold and flu" or decongestant.   Recommend rest, fluids, frequent hand washing.   The entirety of the information documented in the History of Present Illness, Review of Systems and Physical Exam were personally obtained by me. Portions of this information were initially documented by Ashley Royalty, CMA and reviewed by me for thoroughness and accuracy.   Return if symptoms worsen or fail to improve.  Patient Instructions  Upper Respiratory Infection, Adult Most upper respiratory infections (URIs) are caused by a virus. A URI affects the nose, throat, and upper air passages. The most common type of URI is often called "the common cold." Follow these instructions at home:  Take medicines only as told by  your doctor.  Gargle warm saltwater or take cough drops to comfort your throat as told by your doctor.  Use a warm mist humidifier or inhale steam from a shower to increase air moisture. This may make it easier to breathe.  Drink enough fluid to keep your pee (urine) clear or pale yellow.  Eat soups and other clear broths.  Have a healthy diet.  Rest as needed.  Go back to work when your fever is gone or your doctor says it is okay.  You may need to stay home longer to avoid giving your URI to others.  You can also wear a face mask and wash your hands often to prevent spread of the virus.  Use your inhaler more if you have asthma.  Do not use any tobacco products, including cigarettes, chewing tobacco, or electronic cigarettes. If you need help quitting, ask your doctor. Contact a doctor if:  You are getting worse, not better.  Your symptoms are not helped by medicine.  You have chills.  You are getting more short of breath.  You have brown or red mucus.  You have yellow or brown discharge from your nose.  You have pain in  your face, especially when you bend forward.  You have a fever.  You have puffy (swollen) neck glands.  You have pain while swallowing.  You have white areas in the back of your throat. Get help right away if:  You have very bad or constant:  Headache.  Ear pain.  Pain in your forehead, behind your eyes, and over your cheekbones (sinus pain).  Chest pain.  You have long-lasting (chronic) lung disease and any of the following:  Wheezing.  Long-lasting cough.  Coughing up blood.  A change in your usual mucus.  You have a stiff neck.  You have changes in your:  Vision.  Hearing.  Thinking.  Mood. This information is not intended to replace advice given to you by your health care provider. Make sure you discuss any questions you have with your health care provider. Document Released: 03/08/2008 Document Revised: 05/23/2016 Document Reviewed: 12/26/2013 Elsevier Interactive Patient Education  2017 [employer].     The entirety of the information documented in the History of Present Illness, Review of Systems and Physical Exam were personally obtained by me. Portions of this information were initially documented by Ashley Royalty, CMA and reviewed by me for thoroughness and accuracy.

## 2016-09-02 NOTE — Patient Instructions (Signed)
Upper Respiratory Infection, Adult Most upper respiratory infections (URIs) are caused by a virus. A URI affects the nose, throat, and upper air passages. The most common type of URI is often called "the common cold." Follow these instructions at home:  Take medicines only as told by your doctor.  Gargle warm saltwater or take cough drops to comfort your throat as told by your doctor.  Use a warm mist humidifier or inhale steam from a shower to increase air moisture. This may make it easier to breathe.  Drink enough fluid to keep your pee (urine) clear or pale yellow.  Eat soups and other clear broths.  Have a healthy diet.  Rest as needed.  Go back to work when your fever is gone or your doctor says it is okay.  You may need to stay home longer to avoid giving your URI to others.  You can also wear a face mask and wash your hands often to prevent spread of the virus.  Use your inhaler more if you have asthma.  Do not use any tobacco products, including cigarettes, chewing tobacco, or electronic cigarettes. If you need help quitting, ask your doctor. Contact a doctor if:  You are getting worse, not better.  Your symptoms are not helped by medicine.  You have chills.  You are getting more short of breath.  You have brown or red mucus.  You have yellow or brown discharge from your nose.  You have pain in your face, especially when you bend forward.  You have a fever.  You have puffy (swollen) neck glands.  You have pain while swallowing.  You have white areas in the back of your throat. Get help right away if:  You have very bad or constant:  Headache.  Ear pain.  Pain in your forehead, behind your eyes, and over your cheekbones (sinus pain).  Chest pain.  You have long-lasting (chronic) lung disease and any of the following:  Wheezing.  Long-lasting cough.  Coughing up blood.  A change in your usual mucus.  You have a stiff neck.  You have  changes in your:  Vision.  Hearing.  Thinking.  Mood. This information is not intended to replace advice given to you by your health care provider. Make sure you discuss any questions you have with your health care provider. Document Released: 03/08/2008 Document Revised: 05/23/2016 Document Reviewed: 12/26/2013 Elsevier Interactive Patient Education  2017 [employer]  

## 2016-09-05 ENCOUNTER — Other Ambulatory Visit: Payer: Self-pay | Admitting: Family Medicine

## 2016-09-06 ENCOUNTER — Telehealth: Payer: Self-pay | Admitting: Family Medicine

## 2016-09-06 NOTE — Telephone Encounter (Signed)
Please review-aa 

## 2016-09-06 NOTE — Telephone Encounter (Signed)
Butch Penny with [facility] stated that they are out of the [place] for levothyroxine (SYNTHROID, LEVOTHROID) 137 MCG tablet that pt has been taking and need approval to change manufactures to [employer]. Please advise. Thanks TNP

## 2016-09-07 ENCOUNTER — Telehealth: Payer: Self-pay | Admitting: Family Medicine

## 2016-09-07 ENCOUNTER — Ambulatory Visit (INDEPENDENT_AMBULATORY_CARE_PROVIDER_SITE_OTHER): Payer: Medicare Other | Admitting: Family Medicine

## 2016-09-07 ENCOUNTER — Ambulatory Visit: Payer: Self-pay | Admitting: Family Medicine

## 2016-09-07 VITALS — BP 122/60 | HR 84 | Temp 98.2°F | Resp 18 | Wt 174.0 lb

## 2016-09-07 DIAGNOSIS — J410 Simple chronic bronchitis: Secondary | ICD-10-CM | POA: Diagnosis not present

## 2016-09-07 MED ORDER — DOXYCYCLINE HYCLATE 100 MG PO TABS: 100.0000 mg | ORAL_TABLET | Freq: Two times a day (BID) | ORAL | 0 refills | Status: DC

## 2016-09-07 NOTE — Telephone Encounter (Signed)
ok 

## 2016-09-07 NOTE — Telephone Encounter (Signed)
[place]-pharmacist advised since Butch Penny was not there yet-aa

## 2016-09-07 NOTE — Progress Notes (Signed)
Stephanie Charles  MRN: [id number] DOB: 06-03-1930  Subjective:  HPI  Patient is here with a complaint of cold symptoms. She saw Adriana on 09/02/16 and was instructed on taking Robitussin and that her sickness was probably viral. She has been taking the medication but symptoms getting worse. Overall has felt sick for 2 weeks now. She has not taking anything else besides Robitussin. Symptoms present are cough-dry, increased shortness of breath, wheezing, post nasal drainage, sneezing, "head feels heavy."  Denies fever, chills, sweats, sore throat or ear pain.  Patient Active Problem List   Diagnosis Date Noted  . Elevated troponin I level 08/25/2015  . Abnormal blood sugar 03/11/2015  . Allergic rhinitis 03/11/2015  . Absolute anemia 03/11/2015  . Arthropathia 03/11/2015  . Benign essential HTN 03/11/2015  . Benign essential tremor 03/11/2015  . Artery disease, cerebral 03/11/2015  . Chronic venous insufficiency 03/11/2015  . Clinical depression 03/11/2015  . Urinary system disease 03/11/2015  . Acid reflux 03/11/2015  . HLD (hyperlipidemia) 03/11/2015  . Adult hypothyroidism 03/11/2015  . Irritable colon 03/11/2015  . Cannot sleep 03/11/2015  . Weak pulse 03/11/2015  . Carotid stenosis 03/11/2015  . Temporary cerebral vascular dysfunction 03/11/2015  . External carotid artery stenosis 07/12/2014    No past medical history on file.  Social History   Social History  . Marital status: Divorced    Spouse name: na  . Number of children: 4  . Years of education: 14   Occupational History  . retired    Social History Main Topics  . Smoking status: Never Smoker  . Smokeless tobacco: Never Used  . Alcohol use No  . Drug use: No  . Sexual activity: No   Other Topics Concern  . Not on file   Social History Narrative  . No narrative on file    Outpatient Encounter Prescriptions as of 09/07/2016  Medication Sig Note  . allopurinol (ZYLOPRIM) 300 MG tablet TAKE ONE  TABLET BY MOUTH ONCE DAILY   . amitriptyline (ELAVIL) 10 MG tablet Take 2 tablets (20 mg total) by mouth at bedtime.   Marland Kitchen amLODipine (NORVASC) 5 MG tablet TAKE ONE TABLET BY MOUTH ONCE DAILY   . aspirin 81 MG tablet Take by mouth. 03/11/2015: Received from: [facility]  . atorvastatin (LIPITOR) 40 MG tablet TAKE ONE TABLET BY MOUTH ONCE DAILY   . clopidogrel (PLAVIX) 75 MG tablet Take 1 tablet (75 mg total) by mouth daily.   Marland Kitchen dextromethorphan (ROBITUSSIN 12 HOUR COUGH) 30 MG/5ML liquid Take 5 mLs (30 mg total) by mouth as needed for cough.   . hydrALAZINE (APRESOLINE) 50 MG tablet Take 1 tablet (50 mg total) by mouth 3 (three) times daily.   . hydrochlorothiazide (HYDRODIURIL) 25 MG tablet TAKE ONE TABLET BY MOUTH ONCE DAILY   . isosorbide mononitrate (IMDUR) 30 MG 24 hr tablet Take 1 tablet (30 mg total) by mouth every morning.   Marland Kitchen levothyroxine (SYNTHROID, LEVOTHROID) 137 MCG tablet TAKE ONE TABLET BY MOUTH ONCE DAILY   . losartan (COZAAR) 100 MG tablet Take 1 tablet (100 mg total) by mouth daily.   . meclizine (ANTIVERT) 25 MG tablet TAKE ONE TABLET BY MOUTH THREE TIMES DAILY AS NEEDED FOR DIZZINESS   . mirtazapine (REMERON) 30 MG tablet TAKE ONE TABLET BY MOUTH ONCE DAILY   . Multiple Vitamin (MULTIVITAMIN) tablet Take 1 tablet by mouth daily.   . propranolol (INDERAL) 20 MG tablet Take 1 tablet (20 mg total) by mouth 2 (  two) times daily. Patient to use up to two times per day as needed for tremor only   . ranitidine (ZANTAC) 150 MG capsule Take 1 capsule (150 mg total) by mouth 2 (two) times daily.   . sertraline (ZOLOFT) 25 MG tablet TAKE ONE TABLET BY MOUTH ONCE DAILY   . fluticasone (VERAMYST) 27.5 MCG/SPRAY nasal spray Place 2 sprays into the nose daily. (Patient not taking: Reported on 09/07/2016)   . [DISCONTINUED] amLODipine (NORVASC) 5 MG tablet TAKE ONE TABLET BY MOUTH ONCE DAILY    No facility-administered encounter medications on file as of 09/07/2016.      Allergies  Allergen Reactions  . Codeine   . Levofloxacin     Mouth sores  . Simvastatin     Myalgias    Review of Systems  Constitutional: Positive for malaise/fatigue.  HENT: Positive for congestion.        Sneezing, post nasal drainage.  Respiratory: Positive for cough, shortness of breath and wheezing. Negative for sputum production.   Cardiovascular: Negative.   Musculoskeletal: Negative.   Neurological: Negative for dizziness.    Objective:  BP 122/60   Pulse 84   Temp 98.2 F (36.8 C)   Resp 18   Wt 174 lb (78.9 kg)   SpO2 96%   BMI 31.83 kg/m   Physical Exam  Constitutional: She is oriented to person, place, and time and well-developed, well-nourished, and in no distress.  HENT:  Head: Normocephalic and atraumatic.  Right Ear: External ear normal.  Mouth/Throat: Oropharynx is clear and moist.  Left ear is full on the exam  Eyes: Conjunctivae are normal. Pupils are equal, round, and reactive to light.  Neck: Normal range of motion. Neck supple.  Cardiovascular: Normal rate, regular rhythm, normal heart sounds and intact distal pulses.   No murmur heard. Pulmonary/Chest: Effort normal and breath sounds normal. No respiratory distress. She has no wheezes.  Musculoskeletal: She exhibits edema (1+).  Neurological: She is alert and oriented to person, place, and time.    Assessment and Plan :  1. Simple chronic bronchitis ([place]) After 2 weeks will go ahead and treat with Doxy. Push fluids. Will follow as needed. Do not think Chest xray is needed at this time. 2.HTN HPI, Exam and A&P transcribed under direction and in the presence of Miguel Aschoff, MD. I have done the exam and reviewed the chart and it is accurate to the best of my knowledge. Development worker, community has been used and  any errors in dictation or transcription are unintentional. Miguel Aschoff M.D. [facility]

## 2016-09-07 NOTE — Telephone Encounter (Signed)
error 

## 2016-09-09 ENCOUNTER — Ambulatory Visit: Payer: Self-pay | Admitting: Family Medicine

## 2016-09-24 DIAGNOSIS — L821 Other seborrheic keratosis: Secondary | ICD-10-CM | POA: Diagnosis not present

## 2016-10-15 ENCOUNTER — Other Ambulatory Visit: Payer: Self-pay | Admitting: Family Medicine

## 2016-10-15 DIAGNOSIS — F32A Depression, unspecified: Secondary | ICD-10-CM

## 2016-10-15 DIAGNOSIS — F329 Major depressive disorder, single episode, unspecified: Secondary | ICD-10-CM

## 2016-10-18 ENCOUNTER — Other Ambulatory Visit: Payer: Self-pay

## 2016-10-18 DIAGNOSIS — F3289 Other specified depressive episodes: Secondary | ICD-10-CM

## 2016-10-18 MED ORDER — SERTRALINE HCL 25 MG PO TABS: 25.0000 mg | ORAL_TABLET | Freq: Every day | ORAL | 3 refills | Status: DC

## 2016-11-16 ENCOUNTER — Other Ambulatory Visit: Payer: Self-pay | Admitting: Family Medicine

## 2016-11-16 DIAGNOSIS — G4700 Insomnia, unspecified: Secondary | ICD-10-CM

## 2016-12-07 DIAGNOSIS — R0602 Shortness of breath: Secondary | ICD-10-CM | POA: Diagnosis not present

## 2016-12-15 DIAGNOSIS — R0602 Shortness of breath: Secondary | ICD-10-CM | POA: Diagnosis not present

## 2016-12-16 ENCOUNTER — Other Ambulatory Visit: Payer: Self-pay | Admitting: Family Medicine

## 2016-12-16 DIAGNOSIS — I10 Essential (primary) hypertension: Secondary | ICD-10-CM

## 2016-12-21 ENCOUNTER — Ambulatory Visit (INDEPENDENT_AMBULATORY_CARE_PROVIDER_SITE_OTHER): Payer: Medicare Other | Admitting: Family Medicine

## 2016-12-21 ENCOUNTER — Ambulatory Visit
Admission: RE | Admit: 2016-12-21 | Discharge: 2016-12-21 | Disposition: A | Discharge status: Home / Self Care | Payer: Medicare Other | Source: Ambulatory Visit | Attending: Family Medicine | Admitting: Family Medicine

## 2016-12-21 VITALS — BP 160/50 | HR 80 | Temp 98.2°F | Resp 18 | Wt 174.0 lb

## 2016-12-21 DIAGNOSIS — M2578 Osteophyte, vertebrae: Secondary | ICD-10-CM | POA: Insufficient documentation

## 2016-12-21 DIAGNOSIS — M542 Cervicalgia: Secondary | ICD-10-CM | POA: Diagnosis not present

## 2016-12-21 DIAGNOSIS — M5030 Other cervical disc degeneration, unspecified cervical region: Secondary | ICD-10-CM | POA: Insufficient documentation

## 2016-12-21 DIAGNOSIS — M4802 Spinal stenosis, cervical region: Secondary | ICD-10-CM | POA: Diagnosis not present

## 2016-12-21 NOTE — Progress Notes (Signed)
Stephanie Charles  MRN: [id number] DOB: May 16, 1930  Subjective:  HPI   The patient is an 83 year old female who presents for evaluation of neck tenderness.  She states that the bones at the base of her skull are tender to the touch.  Movement does not bother her except that when she looks downward it feels as though it is pulling.  She has not had any headaches, ear pain or other pain of the head.  She has had the symptoms for 6 months.   Not exertional.  Patient Active Problem List   Diagnosis Date Noted  . Elevated troponin I level 08/25/2015  . Abnormal blood sugar 03/11/2015  . Allergic rhinitis 03/11/2015  . Absolute anemia 03/11/2015  . Arthropathia 03/11/2015  . Benign essential HTN 03/11/2015  . Benign essential tremor 03/11/2015  . Artery disease, cerebral 03/11/2015  . Chronic venous insufficiency 03/11/2015  . Clinical depression 03/11/2015  . Urinary system disease 03/11/2015  . Acid reflux 03/11/2015  . HLD (hyperlipidemia) 03/11/2015  . Adult hypothyroidism 03/11/2015  . Irritable colon 03/11/2015  . Cannot sleep 03/11/2015  . Weak pulse 03/11/2015  . Carotid stenosis 03/11/2015  . Temporary cerebral vascular dysfunction 03/11/2015  . External carotid artery stenosis 07/12/2014    No past medical history on file.  Social History   Social History  . Marital status: Divorced    Spouse name: na  . Number of children: 4  . Years of education: 14   Occupational History  . retired    Social History Main Topics  . Smoking status: Never Smoker  . Smokeless tobacco: Never Used  . Alcohol use No  . Drug use: No  . Sexual activity: No   Other Topics Concern  . Not on file   Social History Narrative  . No narrative on file    Outpatient Encounter Prescriptions as of 12/21/2016  Medication Sig Note  . allopurinol (ZYLOPRIM) 300 MG tablet TAKE ONE TABLET BY MOUTH ONCE DAILY   . amitriptyline (ELAVIL) 10 MG tablet TAKE TWO TABLETS BY MOUTH AT BEDTIME   .  amLODipine (NORVASC) 10 MG tablet Take 10 mg by mouth daily.   Marland Kitchen aspirin 81 MG tablet Take by mouth. 03/11/2015: Received from: [facility]  . atorvastatin (LIPITOR) 40 MG tablet TAKE ONE TABLET BY MOUTH ONCE DAILY   . clopidogrel (PLAVIX) 75 MG tablet Take 1 tablet (75 mg total) by mouth daily.   . hydrALAZINE (APRESOLINE) 50 MG tablet Take 1 tablet (50 mg total) by mouth 3 (three) times daily.   . hydrochlorothiazide (HYDRODIURIL) 25 MG tablet TAKE ONE TABLET BY MOUTH ONCE DAILY   . isosorbide mononitrate (IMDUR) 30 MG 24 hr tablet Take 1 tablet (30 mg total) by mouth every morning.   Marland Kitchen levothyroxine (SYNTHROID, LEVOTHROID) 137 MCG tablet TAKE ONE TABLET BY MOUTH ONCE DAILY   . losartan (COZAAR) 100 MG tablet Take 1 tablet (100 mg total) by mouth daily.   . meclizine (ANTIVERT) 25 MG tablet TAKE ONE TABLET BY MOUTH THREE TIMES DAILY AS NEEDED FOR DIZZINESS   . metoprolol tartrate (LOPRESSOR) 25 MG tablet Take 12.5 mg by mouth 2 (two) times daily.   . mirtazapine (REMERON) 30 MG tablet TAKE ONE TABLET BY MOUTH ONCE DAILY   . propranolol (INDERAL) 20 MG tablet Take 1 tablet (20 mg total) by mouth 2 (two) times daily. Patient to use up to two times per day as needed for tremor only   . ranitidine (  ZANTAC) 150 MG capsule Take 1 capsule (150 mg total) by mouth 2 (two) times daily.   . sertraline (ZOLOFT) 25 MG tablet Take 1 tablet (25 mg total) by mouth daily.   Marland Kitchen dextromethorphan (ROBITUSSIN 12 HOUR COUGH) 30 MG/5ML liquid Take 5 mLs (30 mg total) by mouth as needed for cough. (Patient not taking: Reported on 12/21/2016)   . fluticasone (VERAMYST) 27.5 MCG/SPRAY nasal spray Place 2 sprays into the nose daily. (Patient not taking: Reported on 09/07/2016)   . [DISCONTINUED] amLODipine (NORVASC) 5 MG tablet TAKE ONE TABLET BY MOUTH ONCE DAILY   . [DISCONTINUED] doxycycline (VIBRA-TABS) 100 MG tablet Take 1 tablet (100 mg total) by mouth 2 (two) times daily.   . [DISCONTINUED] Multiple  Vitamin (MULTIVITAMIN) tablet Take 1 tablet by mouth daily.    No facility-administered encounter medications on file as of 12/21/2016.     Allergies  Allergen Reactions  . Codeine   . Levofloxacin     Mouth sores  . Simvastatin     Myalgias    Review of Systems  Constitutional: Positive for malaise/fatigue. Negative for fever.  HENT: Positive for hearing loss (wears hearing aids) and sore throat (just started last night, states she woke up with coughing and her throat burning). Negative for congestion, ear discharge, ear pain, nosebleeds, sinus pain and tinnitus.   Eyes: Positive for redness (chronic). Negative for blurred vision, double vision, photophobia, pain and discharge.  Respiratory: Positive for cough and shortness of breath (chronic, cardiologist would like her to see pulmonology). Negative for hemoptysis, sputum production and wheezing.   Cardiovascular: Negative for chest pain, palpitations, orthopnea, claudication and leg swelling.  Musculoskeletal: Positive for neck pain. Negative for back pain, falls, joint pain and myalgias.  Neurological: Positive for tremors (chronic) and sensory change (numbness of her toes, only when she is laying down). Negative for dizziness, tingling, speech change, seizures, weakness and headaches.    Objective:  BP (!) 160/50 (BP Location: Right Arm, Patient Position: Sitting, Cuff Size: Normal)   Pulse 80   Temp 98.2 F (36.8 C) (Oral)   Resp 18   Wt 174 lb (78.9 kg)   SpO2 96%   BMI 31.83 kg/m   Physical Exam  Constitutional: She is well-developed, well-nourished, and in no distress.  HENT:  Head: Normocephalic and atraumatic.  Eyes: Conjunctivae are normal. Pupils are equal, round, and reactive to light.  Neck: Normal range of motion. Neck supple.  Minimal tenderness at the base of the occiput bilaterally  Cardiovascular: Normal rate, regular rhythm and normal heart sounds.   Pulmonary/Chest: Effort normal and breath sounds  normal.    Assessment and Plan :  1. Neck pain Probable osteoathtritis, will obtain x-ray.  Recommend Tylenol and heat for pain.  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 Development worker, community has been used and  any errors in dictation or transcription are unintentional. Miguel Aschoff M.D. [facility]    HPI, Exam and A&P Transcribed under the direction and in the presence of [name]., MD. Electronically Signed: Althea Charon, [place]

## 2016-12-23 DIAGNOSIS — R0602 Shortness of breath: Secondary | ICD-10-CM | POA: Diagnosis not present

## 2017-01-03 ENCOUNTER — Ambulatory Visit (INDEPENDENT_AMBULATORY_CARE_PROVIDER_SITE_OTHER): Payer: Medicare Other | Admitting: Family Medicine

## 2017-01-03 ENCOUNTER — Encounter: Payer: Self-pay | Admitting: Family Medicine

## 2017-01-03 VITALS — BP 128/70 | HR 62 | Temp 98.3°F | Resp 16 | Wt 173.0 lb

## 2017-01-03 DIAGNOSIS — M7751 Other enthesopathy of right foot: Secondary | ICD-10-CM | POA: Diagnosis not present

## 2017-01-03 DIAGNOSIS — M7661 Achilles tendinitis, right leg: Secondary | ICD-10-CM | POA: Diagnosis not present

## 2017-01-03 DIAGNOSIS — J301 Allergic rhinitis due to pollen: Secondary | ICD-10-CM | POA: Diagnosis not present

## 2017-01-03 MED ORDER — METHYLPREDNISOLONE ACETATE 80 MG/ML IJ SUSP
80.0000 mg | Freq: Once | INTRAMUSCULAR | Status: AC
  Administered 2017-01-03: 80 mg via INTRAMUSCULAR

## 2017-01-03 MED ORDER — FLUTICASONE PROPIONATE 50 MCG/ACT NA SUSP: 2.0000 | Freq: Every day | NASAL | 11 refills | Status: DC

## 2017-01-03 MED ORDER — LORATADINE 10 MG PO TBDP: 10.0000 mg | ORAL_TABLET | Freq: Every day | ORAL | 11 refills | Status: DC

## 2017-01-03 NOTE — Progress Notes (Signed)
Subjective:  HPI  Pt reports that she is having right heel/ankle pain. It started about 4 days ago after she was working in the yard. This is the only thing she remembers doing that could have bothered her foot. She says yesterday was much worse than today. She walks on the side of her foot which throws off her balance and makes her back hurt. Pain is located on the back of the heel near the achilles tendon. She has a history of gout. No redness but her foot and ankle have been more swollen than normal.   She also complains of hoarseness for the last 2 months. She says by the end of the night she can only whisper. She does have post nasal drainage and coughs at night. Does sneeze a lot. Denies nasal congestion, other allergy symptoms or reflux symptoms.   Prior to Admission medications   Medication Sig Start Date End Date Taking? Authorizing Provider  allopurinol (ZYLOPRIM) 300 MG tablet TAKE ONE TABLET BY MOUTH ONCE DAILY 08/18/16   Jerrol Banana., MD  amitriptyline (ELAVIL) 10 MG tablet TAKE TWO TABLETS BY MOUTH AT BEDTIME 11/16/16   Deionna Marcantonio Maceo Pro., MD  amLODipine (NORVASC) 10 MG tablet Take 10 mg by mouth daily.    Historical Provider, MD  aspirin 81 MG tablet Take by mouth. 10/26/11   Historical Provider, MD  atorvastatin (LIPITOR) 40 MG tablet TAKE ONE TABLET BY MOUTH ONCE DAILY 08/31/16   Jerrol Banana., MD  clopidogrel (PLAVIX) 75 MG tablet Take 1 tablet (75 mg total) by mouth daily. 11/26/15   Sabah Zucco Maceo Pro., MD  dextromethorphan (ROBITUSSIN 12 HOUR COUGH) 30 MG/5ML liquid Take 5 mLs (30 mg total) by mouth as needed for cough. Patient not taking: Reported on 12/21/2016 09/02/16   Trinna Post, PA-C  fluticasone (VERAMYST) 27.5 MCG/SPRAY nasal spray Place 2 sprays into the nose daily. Patient not taking: Reported on 09/07/2016 03/25/16   Jerrol Banana., MD  hydrALAZINE (APRESOLINE) 50 MG tablet Take 1 tablet (50 mg total) by mouth 3 (three) times daily.  06/02/16   Jerrol Banana., MD  hydrochlorothiazide (HYDRODIURIL) 25 MG tablet TAKE ONE TABLET BY MOUTH ONCE DAILY 12/16/16   Jerrol Banana., MD  isosorbide mononitrate (IMDUR) 30 MG 24 hr tablet Take 1 tablet (30 mg total) by mouth every morning. 08/05/16   Hadleigh Felber Maceo Pro., MD  levothyroxine (SYNTHROID, LEVOTHROID) 137 MCG tablet TAKE ONE TABLET BY MOUTH ONCE DAILY 09/06/16   Jerrol Banana., MD  losartan (COZAAR) 100 MG tablet Take 1 tablet (100 mg total) by mouth daily. 06/02/16   Zennie Ayars Maceo Pro., MD  meclizine (ANTIVERT) 25 MG tablet TAKE ONE TABLET BY MOUTH THREE TIMES DAILY AS NEEDED FOR DIZZINESS 03/10/16   Jerrol Banana., MD  metoprolol tartrate (LOPRESSOR) 25 MG tablet Take 12.5 mg by mouth 2 (two) times daily.    Historical Provider, MD  mirtazapine (REMERON) 30 MG tablet TAKE ONE TABLET BY MOUTH ONCE DAILY 08/30/16   Jerrol Banana., MD  propranolol (INDERAL) 20 MG tablet Take 1 tablet (20 mg total) by mouth 2 (two) times daily. Patient to use up to two times per day as needed for tremor only 06/02/16   Dorrell Mitcheltree L Cranford Mon., MD  ranitidine (ZANTAC) 150 MG capsule Take 1 capsule (150 mg total) by mouth 2 (two) times daily. 08/05/16   Jerrol Banana., MD  sertraline (ZOLOFT) 25 MG tablet Take 1 tablet (25 mg total) by mouth daily. 10/18/16   Saidy Ormand Maceo Pro., MD    Patient Active Problem List   Diagnosis Date Noted  . Elevated troponin I level 08/25/2015  . Abnormal blood sugar 03/11/2015  . Allergic rhinitis 03/11/2015  . Absolute anemia 03/11/2015  . Arthropathia 03/11/2015  . Benign essential HTN 03/11/2015  . Benign essential tremor 03/11/2015  . Artery disease, cerebral 03/11/2015  . Chronic venous insufficiency 03/11/2015  . Clinical depression 03/11/2015  . Urinary system disease 03/11/2015  . Acid reflux 03/11/2015  . HLD (hyperlipidemia) 03/11/2015  . Adult hypothyroidism 03/11/2015  . Irritable colon 03/11/2015  . Cannot  sleep 03/11/2015  . Weak pulse 03/11/2015  . Carotid stenosis 03/11/2015  . Temporary cerebral vascular dysfunction 03/11/2015  . External carotid artery stenosis 07/12/2014    History reviewed. No pertinent past medical history.  Social History   Social History  . Marital status: Divorced    Spouse name: na  . Number of children: 4  . Years of education: 14   Occupational History  . retired    Social History Main Topics  . Smoking status: Never Smoker  . Smokeless tobacco: Never Used  . Alcohol use No  . Drug use: No  . Sexual activity: No   Other Topics Concern  . Not on file   Social History Narrative  . No narrative on file    Allergies  Allergen Reactions  . Codeine   . Levofloxacin     Mouth sores  . Simvastatin     Myalgias    Review of Systems  Constitutional: Negative.   HENT: Negative.        Post nasal drainage and sneezing.   Eyes: Negative.   Respiratory: Positive for cough.   Cardiovascular: Negative.   Gastrointestinal: Negative.   Genitourinary: Negative.   Musculoskeletal: Negative.        Foot pain.  Skin: Negative.   Neurological: Negative.   Endo/Heme/Allergies: Negative.   Psychiatric/Behavioral: Negative.     Immunization History  Administered Date(s) Administered  . Influenza, High Dose Seasonal PF 07/03/2015, 08/05/2016  . Pneumococcal Conjugate-13 07/03/2015  . Pneumococcal Polysaccharide-23 06/20/2013  . Tdap 02/23/2012  . Zoster 08/03/2011    Objective:  BP 128/70 (BP Location: Left Arm, Patient Position: Sitting, Cuff Size: Large)   Pulse 62   Temp 98.3 F (36.8 C) (Oral)   Resp 16   Wt 173 lb (78.5 kg)   BMI 31.64 kg/m   Physical Exam  Constitutional: She is oriented to person, place, and time and well-developed, well-nourished, and in no distress.  HENT:  Head: Normocephalic and atraumatic.  Right Ear: External ear normal.  Left Ear: External ear normal.  Nose: Nose normal.  Mouth/Throat: Oropharynx is  clear and moist.  Eyes: Conjunctivae are normal. No scleral icterus.  Neck: No thyromegaly present.  Cardiovascular: Normal rate, regular rhythm and normal heart sounds.   Pulmonary/Chest: Effort normal and breath sounds normal.  Abdominal: Soft.  Musculoskeletal:  Mild tenderness over right calcaneal bursa and distal achilles.  Neurological: She is alert and oriented to person, place, and time.  Skin: Skin is warm and dry.  Psychiatric: Mood, memory, affect and judgment normal.    Lab Results  Component Value Date   WBC 9.5 08/12/2016   HGB 14.0 12/06/2014   HCT 32.8 (L) 08/12/2016   PLT 212 08/12/2016   GLUCOSE 88 08/12/2016   CHOL 204 (A) 12/06/2014  TRIG 241 (A) 12/06/2014   HDL 31 (A) 12/06/2014   LDLCALC 125 12/06/2014   TSH 5.440 (H) 08/05/2016   HGBA1C 5.5 10/19/2011    CMP     Component Value Date/Time   NA 143 08/12/2016 1155   NA 133 (L) 04/26/2014 1141   K 3.5 08/12/2016 1155   K 3.9 04/26/2014 1141   CL 100 08/12/2016 1155   CL 99 04/26/2014 1141   CO2 26 08/12/2016 1155   CO2 27 04/26/2014 1141   GLUCOSE 88 08/12/2016 1155   GLUCOSE 89 04/26/2014 1141   BUN 17 08/12/2016 1155   BUN 19 (H) 04/26/2014 1141   CREATININE 1.14 (H) 08/12/2016 1155   CREATININE 1.20 04/26/2014 1141   CALCIUM 9.3 08/12/2016 1155   CALCIUM 8.8 04/26/2014 1141   PROT 6.6 08/12/2016 1155   PROT 7.4 04/26/2014 1141   ALBUMIN 4.2 08/12/2016 1155   ALBUMIN 3.8 04/26/2014 1141   AST 30 08/12/2016 1155   AST 22 04/26/2014 1141   ALT 30 08/12/2016 1155   ALT 29 04/26/2014 1141   ALKPHOS 124 (H) 08/12/2016 1155   ALKPHOS 92 04/26/2014 1141   BILITOT 0.4 08/12/2016 1155   BILITOT 0.5 04/26/2014 1141   GFRNONAA 45 (L) 08/12/2016 1155   GFRNONAA 42 (L) 04/26/2014 1141   GFRAA 51 (L) 08/12/2016 1155   GFRAA 49 (L) 04/26/2014 1141    Assessment and Plan :  1. Achilles tendonitis/ bursitis of right lower extremity  - methylPREDNISolone acetate (DEPO-MEDROL) injection 80 mg;  Inject 1 mL (80 mg total) into the muscle once.  2. Right calcaneal bursitis - methylPREDNISolone acetate (DEPO-MEDROL) injection 80 mg; Inject 1 mL (80 mg total) into the muscle once.  3. Allergic rhinitis due to pollen, unspecified chronicity, unspecified seasonality  - fluticasone (FLONASE) 50 MCG/ACT nasal spray; Place 2 sprays into both nostrils daily.  Dispense: 16 g; Refill: 11 - loratadine (CLARITIN REDITABS) 10 MG dissolvable tablet; Take 1 tablet (10 mg total) by mouth daily.  Dispense: 30 tablet; Refill: 11 4.HTN 5.Hoarseness due to AR May need ENT referral. Doubt GERD but possible. I have done the exam and reviewed the above chart and it is accurate to the best of my knowledge. Development worker, community has been used in this note in any air is in the dictation or transcription are unintentional.  [facility] 01/03/2017 2:40 PM

## 2017-01-05 ENCOUNTER — Ambulatory Visit: Payer: Medicare Other

## 2017-01-13 ENCOUNTER — Telehealth: Payer: Self-pay

## 2017-01-13 NOTE — Telephone Encounter (Signed)
Home health nurse asked Stephanie Charles to check and see if she is supposed to still be taking Isosorbide.  She has it on her list but does not have the medication and has not been taking it for a while.  Stephanie Charles is not sure why she is not currently taking it.

## 2017-02-14 ENCOUNTER — Other Ambulatory Visit: Payer: Self-pay | Admitting: Family Medicine

## 2017-02-14 ENCOUNTER — Other Ambulatory Visit: Payer: Self-pay

## 2017-02-14 DIAGNOSIS — I209 Angina pectoris, unspecified: Secondary | ICD-10-CM

## 2017-02-14 MED ORDER — ISOSORBIDE MONONITRATE ER 30 MG PO TB24: 30.0000 mg | ORAL_TABLET | ORAL | 12 refills | Status: DC

## 2017-02-14 NOTE — Telephone Encounter (Signed)
Should be taking.

## 2017-02-14 NOTE — Telephone Encounter (Signed)
Patient advised. She is not sure how she got off the medication. A new refill authorization sent to [facility] and patient will get it and re start taking it-aa

## 2017-02-24 DIAGNOSIS — H40033 Anatomical narrow angle, bilateral: Secondary | ICD-10-CM | POA: Diagnosis not present

## 2017-03-01 ENCOUNTER — Ambulatory Visit (INDEPENDENT_AMBULATORY_CARE_PROVIDER_SITE_OTHER): Payer: Medicare Other | Admitting: Family Medicine

## 2017-03-01 ENCOUNTER — Encounter: Payer: Self-pay | Admitting: Family Medicine

## 2017-03-01 VITALS — BP 138/60 | HR 56 | Temp 97.8°F | Resp 16 | Wt 174.0 lb

## 2017-03-01 DIAGNOSIS — E785 Hyperlipidemia, unspecified: Secondary | ICD-10-CM | POA: Diagnosis not present

## 2017-03-01 DIAGNOSIS — I10 Essential (primary) hypertension: Secondary | ICD-10-CM | POA: Diagnosis not present

## 2017-03-01 DIAGNOSIS — M25473 Effusion, unspecified ankle: Secondary | ICD-10-CM

## 2017-03-01 DIAGNOSIS — E039 Hypothyroidism, unspecified: Secondary | ICD-10-CM

## 2017-03-01 NOTE — Progress Notes (Signed)
Subjective:  HPI Pt is here for bilateral ankle swelling. She reports that it started last week. No pain. No increased shortness of breath. She is taking amlodipine 10 mg daily. She also has a "knot" on the back of her on the right side that she would like looked at. She reports that it has been bothering her for about the last month and sometimes it feels sore. She says it feels like a ball in the back of her neck.    Prior to Admission medications   Medication Sig Start Date End Date Taking? Authorizing Provider  allopurinol (ZYLOPRIM) 300 MG tablet TAKE ONE TABLET BY MOUTH ONCE DAILY Patient not taking: Reported on 01/03/2017 08/18/16   Jerrol Banana., MD  amitriptyline (ELAVIL) 10 MG tablet TAKE TWO TABLETS BY MOUTH AT BEDTIME 11/16/16   Jerrol Banana., MD  amLODipine (NORVASC) 10 MG tablet Take 10 mg by mouth daily.    [provider]  aspirin 81 MG tablet Take by mouth. 10/26/11   [provider]  atorvastatin (LIPITOR) 40 MG tablet TAKE ONE TABLET BY MOUTH ONCE DAILY 08/31/16   Jerrol Banana., MD  clopidogrel (PLAVIX) 75 MG tablet TAKE ONE TABLET BY MOUTH ONCE DAILY 02/14/17   Jerrol Banana., MD  dextromethorphan (ROBITUSSIN 12 HOUR COUGH) 30 MG/5ML liquid Take 5 mLs (30 mg total) by mouth as needed for cough. Patient not taking: Reported on 12/21/2016 09/02/16   Trinna Post, PA-C  fluticasone [facility]) 50 MCG/ACT nasal spray Place 2 sprays into both nostrils daily. 01/03/17   Jerrol Banana., MD  fluticasone (VERAMYST) 27.5 MCG/SPRAY nasal spray Place 2 sprays into the nose daily. Patient not taking: Reported on 09/07/2016 03/25/16   Jerrol Banana., MD  hydrALAZINE (APRESOLINE) 50 MG tablet Take 1 tablet (50 mg total) by mouth 3 (three) times daily. 06/02/16   Jerrol Banana., MD  hydrochlorothiazide (HYDRODIURIL) 25 MG tablet TAKE ONE TABLET BY MOUTH ONCE DAILY 12/16/16   Jerrol Banana., MD  isosorbide  mononitrate (IMDUR) 30 MG 24 hr tablet Take 1 tablet (30 mg total) by mouth every morning. 02/14/17   Jerrol Banana., MD  levothyroxine (SYNTHROID, LEVOTHROID) 137 MCG tablet TAKE ONE TABLET BY MOUTH ONCE DAILY 09/06/16   Jerrol Banana., MD  loratadine (CLARITIN REDITABS) 10 MG dissolvable tablet Take 1 tablet (10 mg total) by mouth daily. 01/03/17   Jerrol Banana., MD  losartan (COZAAR) 100 MG tablet Take 1 tablet (100 mg total) by mouth daily. 06/02/16   Jerrol Banana., MD  meclizine (ANTIVERT) 25 MG tablet TAKE ONE TABLET BY MOUTH THREE TIMES DAILY AS NEEDED FOR DIZZINESS Patient not taking: Reported on 01/03/2017 03/10/16   Jerrol Banana., MD  metoprolol tartrate (LOPRESSOR) 25 MG tablet Take 12.5 mg by mouth 2 (two) times daily.    [provider]  mirtazapine (REMERON) 30 MG tablet TAKE ONE TABLET BY MOUTH ONCE DAILY 08/30/16   Jerrol Banana., MD  Misc Natural Products (GLUCOSAMINE-CHONDROITIN PLUS PO) Take 1 tablet by mouth daily.    [provider]  propranolol (INDERAL) 20 MG tablet Take 1 tablet (20 mg total) by mouth 2 (two) times daily. Patient to use up to two times per day as needed for tremor only Patient not taking: Reported on 01/03/2017 06/02/16   Jerrol Banana., MD  ranitidine (ZANTAC) 150 MG capsule Take  1 capsule (150 mg total) by mouth 2 (two) times daily. 08/05/16   Jerrol Banana., MD  sertraline (ZOLOFT) 25 MG tablet Take 1 tablet (25 mg total) by mouth daily. 10/18/16   Jerrol Banana., MD    Patient Active Problem List   Diagnosis Date Noted  . Elevated troponin I level 08/25/2015  . Abnormal blood sugar 03/11/2015  . Allergic rhinitis 03/11/2015  . Absolute anemia 03/11/2015  . Arthropathia 03/11/2015  . Benign essential HTN 03/11/2015  . Benign essential tremor 03/11/2015  . Artery disease, cerebral 03/11/2015  . Chronic venous insufficiency 03/11/2015  . Clinical depression 03/11/2015  .  Urinary system disease 03/11/2015  . Acid reflux 03/11/2015  . HLD (hyperlipidemia) 03/11/2015  . Adult hypothyroidism 03/11/2015  . Irritable colon 03/11/2015  . Cannot sleep 03/11/2015  . Weak pulse 03/11/2015  . Carotid stenosis 03/11/2015  . Temporary cerebral vascular dysfunction 03/11/2015  . External carotid artery stenosis 07/12/2014    History reviewed. No pertinent past medical history.  Social History   Social History  . Marital status: Divorced    Spouse name: na  . Number of children: 4  . Years of education: 14   Occupational History  . retired    Social History Main Topics  . Smoking status: Never Smoker  . Smokeless tobacco: Never Used  . Alcohol use No  . Drug use: No  . Sexual activity: No   Other Topics Concern  . Not on file   Social History Narrative  . No narrative on file    Allergies  Allergen Reactions  . Codeine   . Levofloxacin     Mouth sores  . Simvastatin     Myalgias    Review of Systems  Constitutional: Negative.   HENT: Negative.   Eyes: Negative.   Respiratory: Negative.   Cardiovascular: Positive for leg swelling.  Gastrointestinal: Negative.   Genitourinary: Negative.   Musculoskeletal: Positive for neck pain.  Skin: Negative.   Neurological: Negative.   Endo/Heme/Allergies: Negative.   Psychiatric/Behavioral: Negative.     Immunization History  Administered Date(s) Administered  . Influenza, High Dose Seasonal PF 07/03/2015, 08/05/2016  . Pneumococcal Conjugate-13 07/03/2015  . Pneumococcal Polysaccharide-23 06/20/2013  . Tdap 02/23/2012  . Zoster 08/03/2011    Objective:  BP 138/60 (BP Location: Left Arm, Patient Position: Sitting, Cuff Size: Normal)   Pulse (!) 56   Temp 97.8 F (36.6 C) (Oral)   Resp 16   Wt 174 lb (78.9 kg)   SpO2 95%   BMI 31.83 kg/m   Physical Exam  Constitutional: She is oriented to person, place, and time and well-developed, well-nourished, and in no distress.  HENT:    Head: Normocephalic and atraumatic.  Right Ear: External ear normal.  Left Ear: External ear normal.  Nose: Nose normal.  Eyes: Conjunctivae are normal. No scleral icterus.  Neck: No thyromegaly present.  Cardiovascular: Normal rate, regular rhythm and normal heart sounds.   Pulmonary/Chest: Effort normal and breath sounds normal.  Abdominal: Soft.  Neurological: She is alert and oriented to person, place, and time. Gait normal. GCS score is 15.  Skin: Skin is warm and dry.  Psychiatric: Mood, memory, affect and judgment normal.    Lab Results  Component Value Date   WBC 9.5 08/12/2016   HGB 14.0 12/06/2014   HCT 32.8 (L) 08/12/2016   PLT 212 08/12/2016   GLUCOSE 88 08/12/2016   CHOL 204 (A) 12/06/2014   TRIG 241 (A)  12/06/2014   HDL 31 (A) 12/06/2014   LDLCALC 125 12/06/2014   TSH 5.440 (H) 08/05/2016   HGBA1C 5.5 10/19/2011    CMP     Component Value Date/Time   NA 143 08/12/2016 1155   NA 133 (L) 04/26/2014 1141   K 3.5 08/12/2016 1155   K 3.9 04/26/2014 1141   CL 100 08/12/2016 1155   CL 99 04/26/2014 1141   CO2 26 08/12/2016 1155   CO2 27 04/26/2014 1141   GLUCOSE 88 08/12/2016 1155   GLUCOSE 89 04/26/2014 1141   BUN 17 08/12/2016 1155   BUN 19 (H) 04/26/2014 1141   CREATININE 1.14 (H) 08/12/2016 1155   CREATININE 1.20 04/26/2014 1141   CALCIUM 9.3 08/12/2016 1155   CALCIUM 8.8 04/26/2014 1141   PROT 6.6 08/12/2016 1155   PROT 7.4 04/26/2014 1141   ALBUMIN 4.2 08/12/2016 1155   ALBUMIN 3.8 04/26/2014 1141   AST 30 08/12/2016 1155   AST 22 04/26/2014 1141   ALT 30 08/12/2016 1155   ALT 29 04/26/2014 1141   ALKPHOS 124 (H) 08/12/2016 1155   ALKPHOS 92 04/26/2014 1141   BILITOT 0.4 08/12/2016 1155   BILITOT 0.5 04/26/2014 1141   GFRNONAA 45 (L) 08/12/2016 1155   GFRNONAA 42 (L) 04/26/2014 1141   GFRAA 51 (L) 08/12/2016 1155   GFRAA 49 (L) 04/26/2014 1141    Assessment and Plan :  1. Essential hypertension  - CBC with Differential/Platelet -  TSH  2. Adult hypothyroidism   3. Ankle swelling, unspecified laterality   4. Hyperlipidemia, unspecified hyperlipidemia type  - Lipid Panel With LDL/HDL Ratio - Comprehensive metabolic panel  I have done the exam and reviewed the above chart and it is accurate to the best of my knowledge. Development worker, community has been used in this note in any air is in the dictation or transcription are unintentional.  [facility] 03/01/2017 11:06 AM

## 2017-03-07 DIAGNOSIS — E785 Hyperlipidemia, unspecified: Secondary | ICD-10-CM | POA: Diagnosis not present

## 2017-03-07 DIAGNOSIS — I10 Essential (primary) hypertension: Secondary | ICD-10-CM | POA: Diagnosis not present

## 2017-03-08 LAB — COMPREHENSIVE METABOLIC PANEL
A/G RATIO: 2 (ref 1.2–2.2)
ALT: 19 IU/L (ref 0–32)
AST: 20 IU/L (ref 0–40)
Albumin: 4.3 g/dL (ref 3.5–4.7)
Alkaline Phosphatase: 111 IU/L (ref 39–117)
BUN/Creatinine Ratio: 18 (ref 12–28)
BUN: 21 mg/dL (ref 8–27)
Bilirubin Total: 0.4 mg/dL (ref 0.0–1.2)
CALCIUM: 9.1 mg/dL (ref 8.7–10.3)
CO2: 24 mmol/L (ref 18–29)
CREATININE: 1.18 mg/dL — AB (ref 0.57–1.00)
Chloride: 102 mmol/L (ref 96–106)
GFR calc Af Amer: 49 mL/min/{1.73_m2} — ABNORMAL LOW (ref >59)
GFR, EST NON AFRICAN AMERICAN: 42 mL/min/{1.73_m2} — AB (ref >59)
GLUCOSE: 93 mg/dL (ref 65–99)
Globulin, Total: 2.2 g/dL (ref 1.5–4.5)
POTASSIUM: 4.3 mmol/L (ref 3.5–5.2)
Sodium: 144 mmol/L (ref 134–144)
Total Protein: 6.5 g/dL (ref 6.0–8.5)

## 2017-03-08 LAB — CBC WITH DIFFERENTIAL/PLATELET
BASOS: 1 %
Basophils Absolute: 0.1 10*3/uL (ref 0.0–0.2)
EOS (ABSOLUTE): 0.2 10*3/uL (ref 0.0–0.4)
EOS: 2 %
HEMATOCRIT: 36.9 % (ref 34.0–46.6)
Hemoglobin: 12.3 g/dL (ref 11.1–15.9)
Immature Grans (Abs): 0 10*3/uL (ref 0.0–0.1)
Immature Granulocytes: 0 %
LYMPHS ABS: 2.1 10*3/uL (ref 0.7–3.1)
Lymphs: 22 %
MCH: 30.1 pg (ref 26.6–33.0)
MCHC: 33.3 g/dL (ref 31.5–35.7)
MCV: 90 fL (ref 79–97)
MONOS ABS: 0.6 10*3/uL (ref 0.1–0.9)
Monocytes: 6 %
Neutrophils Absolute: 6.7 10*3/uL (ref 1.4–7.0)
Neutrophils: 69 %
Platelets: 194 10*3/uL (ref 150–379)
RBC: 4.08 x10E6/uL (ref 3.77–5.28)
RDW: 14.3 % (ref 12.3–15.4)
WBC: 9.6 10*3/uL (ref 3.4–10.8)

## 2017-03-08 LAB — LIPID PANEL WITH LDL/HDL RATIO
Cholesterol, Total: 114 mg/dL (ref 100–199)
HDL: 35 mg/dL — ABNORMAL LOW (ref >39)
LDL Calculated: 47 mg/dL (ref 0–99)
LDl/HDL Ratio: 1.3 ratio (ref 0.0–3.2)
Triglycerides: 162 mg/dL — ABNORMAL HIGH (ref 0–149)
VLDL Cholesterol Cal: 32 mg/dL (ref 5–40)

## 2017-03-08 LAB — TSH: TSH: 2.86 u[IU]/mL (ref 0.450–4.500)

## 2017-03-11 ENCOUNTER — Telehealth: Payer: Self-pay

## 2017-03-11 NOTE — Telephone Encounter (Signed)
-----   Message from Trinna Post, [place] sent at 03/08/2017  9:17 AM EDT ----- CBC normal. Cholesterol improved. TSH normal. CMP with slightly reduced but stable kidney function.

## 2017-03-11 NOTE — Telephone Encounter (Signed)
LMTCB 03/11/2017  Thanks,   -Mickel Baas

## 2017-03-15 NOTE — Telephone Encounter (Signed)
Advised  ED 

## 2017-03-18 DIAGNOSIS — H6063 Unspecified chronic otitis externa, bilateral: Secondary | ICD-10-CM | POA: Diagnosis not present

## 2017-03-18 DIAGNOSIS — H6123 Impacted cerumen, bilateral: Secondary | ICD-10-CM | POA: Diagnosis not present

## 2017-04-20 ENCOUNTER — Ambulatory Visit (INDEPENDENT_AMBULATORY_CARE_PROVIDER_SITE_OTHER): Payer: Medicare Other | Admitting: Physician Assistant

## 2017-04-20 ENCOUNTER — Encounter: Payer: Self-pay | Admitting: Physician Assistant

## 2017-04-20 VITALS — BP 122/64 | HR 64 | Resp 20 | Wt 179.0 lb

## 2017-04-20 DIAGNOSIS — R600 Localized edema: Secondary | ICD-10-CM | POA: Diagnosis not present

## 2017-04-20 DIAGNOSIS — E039 Hypothyroidism, unspecified: Secondary | ICD-10-CM | POA: Diagnosis not present

## 2017-04-20 DIAGNOSIS — R0602 Shortness of breath: Secondary | ICD-10-CM | POA: Diagnosis not present

## 2017-04-20 DIAGNOSIS — I872 Venous insufficiency (chronic) (peripheral): Secondary | ICD-10-CM | POA: Diagnosis not present

## 2017-04-20 DIAGNOSIS — I10 Essential (primary) hypertension: Secondary | ICD-10-CM | POA: Diagnosis not present

## 2017-04-20 DIAGNOSIS — M25473 Effusion, unspecified ankle: Secondary | ICD-10-CM

## 2017-04-20 MED ORDER — AMLODIPINE BESYLATE 5 MG PO TABS: 5.0000 mg | ORAL_TABLET | Freq: Every day | ORAL | 3 refills | Status: DC

## 2017-04-20 NOTE — Progress Notes (Signed)
Patient: Stephanie Charles Female    DOB: [birth year]   83 y.o.   MRN: [id number] Visit Date: 04/20/2017  Today's Provider: Trinna Post, PA-C   Chief Complaint  Patient presents with  . Edema    Bilateral ankles; right worse than left.   Subjective:    HPI   Kameko Hukill is an 83 y/o woman with history of SOB, chronic venous insufficiency, and adult hypothyroidism presenting today for bilateral ankle swelling. She was initially seen for this by Dr. Rosanna Randy on 03/01/2017. She reports worsening ankle swelling for 2-3 weeks, having difficulty fitting into her shoes. She is SOB, though has a history of this. No productive cough, wheezing. She does not feel like she has fluid in her abdomen. She has not been walking more than usual. No pain in her lower extremities, or erythema.   Of note, Ms. Bertz has received a cardiac workup from Dr. Ubaldo Glassing in cardiology. On 12/07/2016, echocardiogram showed preserved LV EF at 55% and mildly enlarged heart. NM Spect was normal as well. EKG stress test was also normal. Holter monitor from 08/2016 showed Sinus rhythm with occasional PVCs and PACs w/ nonsustained runs of SVT. Cardiac cause of SOB was r/o and she was sent to pulmonology where she had normal pulmonary function studies.   Also of note, BNP from 08/2016 was 120 and TSH from 03/2017 was normal. She was started on 10 mg amlodipine up from 5 mg amlodipine in 12/2016 for better blood pressure control.   Edema: Patient complains of edema. The location of the edema is lower leg(s) bilateral.  The edema has been severe.  Onset of symptoms was 1 week ago, gradually worsening since that time. The edema is present in the afternoon. The patient states she has had swelling before but not to this extent.  The swelling has been aggravated by nothing, relieved by elevation of involved area, and been associated with nothing. Cardiac risk factors include advanced age (older than 10 for men, 53 for women).       Allergies  Allergen Reactions  . Codeine   . Levofloxacin     Mouth sores  . Simvastatin     Myalgias     Current Outpatient Prescriptions:  .  allopurinol (ZYLOPRIM) 300 MG tablet, TAKE ONE TABLET BY MOUTH ONCE DAILY, Disp: 30 tablet, Rfl: 12 .  amitriptyline (ELAVIL) 10 MG tablet, TAKE TWO TABLETS BY MOUTH AT BEDTIME, Disp: 60 tablet, Rfl: 12 .  amLODipine (NORVASC) 10 MG tablet, Take 10 mg by mouth daily., Disp: , Rfl:  .  aspirin 81 MG tablet, Take by mouth., Disp: , Rfl:  .  atorvastatin (LIPITOR) 40 MG tablet, TAKE ONE TABLET BY MOUTH ONCE DAILY, Disp: 90 tablet, Rfl: 3 .  clopidogrel (PLAVIX) 75 MG tablet, TAKE ONE TABLET BY MOUTH ONCE DAILY, Disp: 90 tablet, Rfl: 3 .  hydrALAZINE (APRESOLINE) 50 MG tablet, Take 1 tablet (50 mg total) by mouth 3 (three) times daily., Disp: 90 tablet, Rfl: 3 .  hydrochlorothiazide (HYDRODIURIL) 25 MG tablet, TAKE ONE TABLET BY MOUTH ONCE DAILY, Disp: 90 tablet, Rfl: 3 .  isosorbide mononitrate (IMDUR) 30 MG 24 hr tablet, Take 1 tablet (30 mg total) by mouth every morning., Disp: 30 tablet, Rfl: 12 .  levothyroxine (SYNTHROID, LEVOTHROID) 137 MCG tablet, TAKE ONE TABLET BY MOUTH ONCE DAILY, Disp: 90 tablet, Rfl: 4 .  losartan (COZAAR) 100 MG tablet, Take 1 tablet (100 mg total) by mouth daily., Disp:  90 tablet, Rfl: 3 .  meclizine (ANTIVERT) 25 MG tablet, TAKE ONE TABLET BY MOUTH THREE TIMES DAILY AS NEEDED FOR DIZZINESS, Disp: 30 tablet, Rfl: 12 .  metoprolol tartrate (LOPRESSOR) 25 MG tablet, Take 12.5 mg by mouth 2 (two) times daily., Disp: , Rfl:  .  mirtazapine (REMERON) 30 MG tablet, TAKE ONE TABLET BY MOUTH ONCE DAILY, Disp: 90 tablet, Rfl: 3 .  Misc Natural Products (GLUCOSAMINE-CHONDROITIN PLUS PO), Take 1 tablet by mouth daily., Disp: , Rfl:  .  propranolol (INDERAL) 20 MG tablet, Take 1 tablet (20 mg total) by mouth 2 (two) times daily. Patient to use up to two times per day as needed for tremor only, Disp: 60 tablet, Rfl: 1 .   sertraline (ZOLOFT) 25 MG tablet, Take 1 tablet (25 mg total) by mouth daily., Disp: 90 tablet, Rfl: 3 .  dextromethorphan (ROBITUSSIN 12 HOUR COUGH) 30 MG/5ML liquid, Take 5 mLs (30 mg total) by mouth as needed for cough. (Patient not taking: Reported on 12/21/2016), Disp: 89 mL, Rfl: 0 .  fluticasone (FLONASE) 50 MCG/ACT nasal spray, Place 2 sprays into both nostrils daily. (Patient not taking: Reported on 04/20/2017), Disp: 16 g, Rfl: 11 .  fluticasone (VERAMYST) 27.5 MCG/SPRAY nasal spray, Place 2 sprays into the nose daily. (Patient not taking: Reported on 09/07/2016), Disp: 10 g, Rfl: 12 .  loratadine (CLARITIN REDITABS) 10 MG dissolvable tablet, Take 1 tablet (10 mg total) by mouth daily. (Patient not taking: Reported on 04/20/2017), Disp: 30 tablet, Rfl: 11 .  ranitidine (ZANTAC) 150 MG capsule, Take 1 capsule (150 mg total) by mouth 2 (two) times daily. (Patient not taking: Reported on 04/20/2017), Disp: 60 capsule, Rfl: 12  Review of Systems  Constitutional: Positive for fatigue (Pt reports she "not felt good this past week". ). Negative for activity change, appetite change, chills, diaphoresis, fever and unexpected weight change.  Respiratory: Positive for shortness of breath and wheezing. Negative for apnea, cough, choking, chest tightness and stridor.   Cardiovascular: Positive for leg swelling. Negative for chest pain and palpitations.  Gastrointestinal: Negative.   Musculoskeletal: Negative.   Neurological: Negative for dizziness, light-headedness and headaches.    Social History  Substance Use Topics  . Smoking status: Never Smoker  . Smokeless tobacco: Never Used  . Alcohol use No   Objective:   BP 122/64 (BP Location: Left Arm, Patient Position: Sitting, Cuff Size: Large)   Pulse 64   Resp 20   Wt 179 lb (81.2 kg)   SpO2 95%   BMI 32.74 kg/m  Vitals:   04/20/17 1517  BP: 122/64  Pulse: 64  Resp: 20  SpO2: 95%  Weight: 179 lb (81.2 kg)     Physical Exam   Constitutional: She is oriented to person, place, and time. She appears well-developed and well-nourished.  Cardiovascular: Normal rate, regular rhythm and normal heart sounds.   Pulmonary/Chest: Effort normal and breath sounds normal. No respiratory distress. She has no wheezes. She has no rales.  Abdominal: Soft. Bowel sounds are normal. She exhibits no fluid wave.  Musculoskeletal: She exhibits edema.  2+ pitting edema in feet bilaterally.  Neurological: She is alert and oriented to person, place, and time.  Skin: Skin is warm and dry.  Psychiatric: She has a normal mood and affect. Her behavior is normal.        Assessment & Plan:     1. Ankle swelling, unspecified laterality  I have personally reviewed her echo, Spect study, and EKG stress test  from 12/2016. I have reviewed relevant labwork from 08/2016 and onward. I have reviewed prior cardiology notes with Dr. Ubaldo Glassing. In light of her normal cardiac and pulmonary work up as well as recent normal TSH, suspect her ankle edema is from the increase in amlodipine. Will have her take 1/2 tablet and f/u with Dr. Rosanna Randy in 2 weeks. Will get CXR to assess SOB, though this seems to be somewhat of chronic issue for this patient.  2. Pedal edema  - amLODipine (NORVASC) 5 MG tablet; Take 1 tablet (5 mg total) by mouth daily.  Dispense: 90 tablet; Refill: 3 - DG Chest 2 View; Future  3. SOB (shortness of breath)  - DG Chest 2 View; Future  4. Chronic venous insufficiency  5. Benign essential HTN  Stable today, recheck 2 weeks.  6. Adult hypothyroidism  Stable.  Return in about 2 weeks (around 05/04/2017) for Dr. Rosanna Randy, ankle edema .  The entirety of the information documented in the History of Present Illness, Review of Systems and Physical Exam were personally obtained by me. Portions of this information were initially documented by Ashley Royalty, CMA and reviewed by me for thoroughness and accuracy.        Trinna Post, PA-C   [facility]

## 2017-04-20 NOTE — Patient Instructions (Addendum)
Please take 5 mg of the amlodipine, or 1/2 tablet of your amlodipine    Hypertension Hypertension is another name for high blood pressure. High blood pressure forces your heart to work harder to pump blood. This can cause problems over time. There are two numbers in a blood pressure reading. There is a top number (systolic) over a bottom number (diastolic). It is best to have a blood pressure below 120/80. Healthy choices can help lower your blood pressure. You may need medicine to help lower your blood pressure if:  Your blood pressure cannot be lowered with healthy choices.  Your blood pressure is higher than 130/80.  Follow these instructions at home: Eating and drinking  If directed, follow the DASH eating plan. This diet includes: ? Filling half of your plate at each meal with fruits and vegetables. ? Filling one quarter of your plate at each meal with whole grains. Whole grains include whole wheat pasta, brown rice, and whole grain bread. ? Eating or drinking low-fat dairy products, such as skim milk or low-fat yogurt. ? Filling one quarter of your plate at each meal with low-fat (lean) proteins. Low-fat proteins include fish, skinless chicken, eggs, beans, and tofu. ? Avoiding fatty meat, cured and processed meat, or chicken with skin. ? Avoiding premade or processed food.  Eat less than 1,500 mg of salt (sodium) a day.  Limit alcohol use to no more than 1 drink a day for nonpregnant women and 2 drinks a day for men. One drink equals 12 oz of beer, 5 oz of wine, or 1 oz of hard liquor. Lifestyle  Work with your doctor to stay at a healthy weight or to lose weight. Ask your doctor what the best weight is for you.  Get at least 30 minutes of exercise that causes your heart to beat faster (aerobic exercise) most days of the week. This may include walking, swimming, or biking.  Get at least 30 minutes of exercise that strengthens your muscles (resistance exercise) at least 3 days a  week. This may include lifting weights or pilates.  Do not use any products that contain nicotine or tobacco. This includes cigarettes and e-cigarettes. If you need help quitting, ask your doctor.  Check your blood pressure at home as told by your doctor.  Keep all follow-up visits as told by your doctor. This is important. Medicines  Take over-the-counter and prescription medicines only as told by your doctor. Follow directions carefully.  Do not skip doses of blood pressure medicine. The medicine does not work as well if you skip doses. Skipping doses also puts you at risk for problems.  Ask your doctor about side effects or reactions to medicines that you should watch for. Contact a doctor if:  You think you are having a reaction to the medicine you are taking.  You have headaches that keep coming back (recurring).  You feel dizzy.  You have swelling in your ankles.  You have trouble with your vision. Get help right away if:  You get a very bad headache.  You start to feel confused.  You feel weak or numb.  You feel faint.  You get very bad pain in your: ? Chest. ? Belly (abdomen).  You throw up (vomit) more than once.  You have trouble breathing. Summary  Hypertension is another name for high blood pressure.  Making healthy choices can help lower blood pressure. If your blood pressure cannot be controlled with healthy choices, you may need to take  medicine. This information is not intended to replace advice given to you by your health care provider. Make sure you discuss any questions you have with your health care provider. Document Released: 03/08/2008 Document Revised: 08/18/2016 Document Reviewed: 08/18/2016 Elsevier Interactive Patient Education  [employer].

## 2017-04-21 ENCOUNTER — Ambulatory Visit
Admission: RE | Admit: 2017-04-21 | Discharge: 2017-04-21 | Disposition: A | Discharge status: Home / Self Care | Payer: Medicare Other | Source: Ambulatory Visit | Attending: Physician Assistant | Admitting: Physician Assistant

## 2017-04-21 ENCOUNTER — Ambulatory Visit: Payer: Self-pay | Admitting: Physician Assistant

## 2017-04-21 DIAGNOSIS — R600 Localized edema: Secondary | ICD-10-CM | POA: Diagnosis not present

## 2017-04-21 DIAGNOSIS — R0602 Shortness of breath: Secondary | ICD-10-CM | POA: Insufficient documentation

## 2017-04-22 ENCOUNTER — Telehealth: Payer: Self-pay

## 2017-04-22 NOTE — Telephone Encounter (Signed)
Left message advising py. OK per DPR.

## 2017-04-22 NOTE — Telephone Encounter (Signed)
-----   Message from Trinna Post, [place] sent at 04/21/2017  4:28 PM EDT ----- Normal CXR.

## 2017-04-26 ENCOUNTER — Ambulatory Visit: Payer: Self-pay | Admitting: Family Medicine

## 2017-05-04 ENCOUNTER — Encounter: Payer: Self-pay | Admitting: Family Medicine

## 2017-05-04 ENCOUNTER — Ambulatory Visit (INDEPENDENT_AMBULATORY_CARE_PROVIDER_SITE_OTHER): Payer: Medicare Other | Admitting: Family Medicine

## 2017-05-04 VITALS — BP 122/64 | HR 72 | Temp 98.2°F | Resp 16 | Wt 179.0 lb

## 2017-05-04 DIAGNOSIS — M25471 Effusion, right ankle: Secondary | ICD-10-CM | POA: Diagnosis not present

## 2017-05-04 DIAGNOSIS — I10 Essential (primary) hypertension: Secondary | ICD-10-CM | POA: Diagnosis not present

## 2017-05-04 DIAGNOSIS — M25472 Effusion, left ankle: Secondary | ICD-10-CM | POA: Diagnosis not present

## 2017-05-04 DIAGNOSIS — H02109 Unspecified ectropion of unspecified eye, unspecified eyelid: Secondary | ICD-10-CM | POA: Diagnosis not present

## 2017-05-04 NOTE — Progress Notes (Signed)
Patient: Stephanie Charles Female    DOB: Feb 22, 1930   83 y.o.   MRN: [id number] Visit Date: 05/04/2017  Today's Provider: [name], MD   Chief Complaint  Patient presents with  . Hypertension  . Edema   Subjective:    Pt comes in today for a two week follow up on ankle edema and blood pressure.  Stephanie Charles presented in the office about two weeks ago with bilateral ankle edema. (Right worse than left.)  She was advised to reduce her Amlodipine to 5mg  a day.  Pt reports some minor improvement with her swelling.  She says her ankles are more swollen secondary to being on her feet a lot today.  She has not been checking her blood pressure at home but did check yesterday it was 137/62.      Hypertension  This is a chronic problem. The problem is unchanged (BP yesterday was 137/62. ). The problem is controlled. Associated symptoms include neck pain, peripheral edema and shortness of breath. Pertinent negatives include no anxiety, blurred vision, chest pain, headaches or palpitations. There are no associated agents to hypertension. There are no compliance problems.        Allergies  Allergen Reactions  . Codeine   . Levofloxacin     Mouth sores  . Simvastatin     Myalgias     Current Outpatient Prescriptions:  .  allopurinol (ZYLOPRIM) 300 MG tablet, TAKE ONE TABLET BY MOUTH ONCE DAILY, Disp: 30 tablet, Rfl: 12 .  amitriptyline (ELAVIL) 10 MG tablet, TAKE TWO TABLETS BY MOUTH AT BEDTIME, Disp: 60 tablet, Rfl: 12 .  amLODipine (NORVASC) 5 MG tablet, Take 1 tablet (5 mg total) by mouth daily., Disp: 90 tablet, Rfl: 3 .  aspirin 81 MG tablet, Take by mouth., Disp: , Rfl:  .  atorvastatin (LIPITOR) 40 MG tablet, TAKE ONE TABLET BY MOUTH ONCE DAILY, Disp: 90 tablet, Rfl: 3 .  clopidogrel (PLAVIX) 75 MG tablet, TAKE ONE TABLET BY MOUTH ONCE DAILY, Disp: 90 tablet, Rfl: 3 .  dextromethorphan (ROBITUSSIN 12 HOUR COUGH) 30 MG/5ML liquid, Take 5 mLs (30 mg total) by mouth as  needed for cough., Disp: 89 mL, Rfl: 0 .  fluticasone (FLONASE) 50 MCG/ACT nasal spray, Place 2 sprays into both nostrils daily., Disp: 16 g, Rfl: 11 .  fluticasone (VERAMYST) 27.5 MCG/SPRAY nasal spray, Place 2 sprays into the nose daily., Disp: 10 g, Rfl: 12 .  hydrALAZINE (APRESOLINE) 50 MG tablet, Take 1 tablet (50 mg total) by mouth 3 (three) times daily., Disp: 90 tablet, Rfl: 3 .  hydrochlorothiazide (HYDRODIURIL) 25 MG tablet, TAKE ONE TABLET BY MOUTH ONCE DAILY, Disp: 90 tablet, Rfl: 3 .  isosorbide mononitrate (IMDUR) 30 MG 24 hr tablet, Take 1 tablet (30 mg total) by mouth every morning., Disp: 30 tablet, Rfl: 12 .  levothyroxine (SYNTHROID, LEVOTHROID) 137 MCG tablet, TAKE ONE TABLET BY MOUTH ONCE DAILY, Disp: 90 tablet, Rfl: 4 .  loratadine (CLARITIN REDITABS) 10 MG dissolvable tablet, Take 1 tablet (10 mg total) by mouth daily., Disp: 30 tablet, Rfl: 11 .  losartan (COZAAR) 100 MG tablet, Take 1 tablet (100 mg total) by mouth daily., Disp: 90 tablet, Rfl: 3 .  meclizine (ANTIVERT) 25 MG tablet, TAKE ONE TABLET BY MOUTH THREE TIMES DAILY AS NEEDED FOR DIZZINESS, Disp: 30 tablet, Rfl: 12 .  metoprolol tartrate (LOPRESSOR) 25 MG tablet, Take 12.5 mg by mouth 2 (two) times daily., Disp: , Rfl:  .  mirtazapine (REMERON) 30 MG tablet, TAKE ONE TABLET BY MOUTH ONCE DAILY, Disp: 90 tablet, Rfl: 3 .  Misc Natural Products (GLUCOSAMINE-CHONDROITIN PLUS PO), Take 1 tablet by mouth daily., Disp: , Rfl:  .  propranolol (INDERAL) 20 MG tablet, Take 1 tablet (20 mg total) by mouth 2 (two) times daily. Patient to use up to two times per day as needed for tremor only, Disp: 60 tablet, Rfl: 1 .  ranitidine (ZANTAC) 150 MG capsule, Take 1 capsule (150 mg total) by mouth 2 (two) times daily., Disp: 60 capsule, Rfl: 12 .  sertraline (ZOLOFT) 25 MG tablet, Take 1 tablet (25 mg total) by mouth daily., Disp: 90 tablet, Rfl: 3  Review of Systems  Constitutional: Negative.   Eyes: Negative for blurred vision.   Respiratory: Positive for cough, shortness of breath and wheezing. Negative for apnea, choking, chest tightness and stridor.   Cardiovascular: Positive for leg swelling. Negative for chest pain and palpitations.  Gastrointestinal: Negative.   Musculoskeletal: Positive for neck pain. Negative for arthralgias, back pain, gait problem, joint swelling, myalgias and neck stiffness.  Neurological: Negative for dizziness, light-headedness and headaches.    Social History  Substance Use Topics  . Smoking status: Never Smoker  . Smokeless tobacco: Never Used  . Alcohol use No   Objective:   BP 122/64 (BP Location: Right Arm, Patient Position: Sitting, Cuff Size: Large)   Pulse 72   Temp 98.2 F (36.8 C) (Oral)   Resp 16   Wt 179 lb (81.2 kg)   BMI 32.74 kg/m  Vitals:   05/04/17 1508  BP: 122/64  Pulse: 72  Resp: 16  Temp: 98.2 F (36.8 C)  TempSrc: Oral  Weight: 179 lb (81.2 kg)     Physical Exam  Constitutional: She is oriented to person, place, and time. She appears well-developed and well-nourished.  Neck: Carotid bruit is not present.  Cardiovascular: Normal rate, regular rhythm and normal heart sounds.   Pulmonary/Chest: Effort normal and breath sounds normal.  Musculoskeletal: She exhibits edema (Bilateral Ankle Edema; right worse than left.  The swelling does not go above or below her ankles.  ).  Neurological: She is alert and oriented to person, place, and time. She has normal reflexes.  Skin: Skin is warm and dry.  Psychiatric: She has a normal mood and affect. Her behavior is normal. Judgment and thought content normal.        Assessment & Plan:     1. Benign essential HTN Blood pressure stable on Amlodipine 5mg  daily.  Will discontinue Amlodipine to see if that helps with her ankle edema.  Advised pt to monitor blood pressure at home.  Will recheck in three weeks.    2. Ankle edema, bilateral See above.   Patient seen and examined by Miguel Aschoff, MD,  and note scribed by Ashley Royalty, CMA 3.GAD       [name], MD  [facility]

## 2017-05-06 DIAGNOSIS — H02403 Unspecified ptosis of bilateral eyelids: Secondary | ICD-10-CM | POA: Diagnosis not present

## 2017-05-25 ENCOUNTER — Ambulatory Visit (INDEPENDENT_AMBULATORY_CARE_PROVIDER_SITE_OTHER): Payer: Medicare Other | Admitting: Family Medicine

## 2017-05-25 VITALS — BP 174/62 | HR 66 | Temp 97.8°F | Resp 18 | Wt 179.0 lb

## 2017-05-25 DIAGNOSIS — M25472 Effusion, left ankle: Secondary | ICD-10-CM

## 2017-05-25 DIAGNOSIS — I10 Essential (primary) hypertension: Secondary | ICD-10-CM | POA: Diagnosis not present

## 2017-05-25 DIAGNOSIS — M25471 Effusion, right ankle: Secondary | ICD-10-CM

## 2017-05-25 DIAGNOSIS — Z23 Encounter for immunization: Secondary | ICD-10-CM | POA: Diagnosis not present

## 2017-05-25 MED ORDER — HYDRALAZINE HCL 100 MG PO TABS: 100.0000 mg | ORAL_TABLET | Freq: Three times a day (TID) | ORAL | 3 refills | Status: DC

## 2017-05-25 NOTE — Progress Notes (Signed)
Stephanie Charles  MRN: [id number] DOB: [birth year]  Subjective:  HPI  Patient is here for 3 weeks follow up. Last office visit was on 05/04/17 for HTN and Edema. B/P was stable on Amlodipine but was causing ankle swelling and patient was advised to stop Amlodipine at that time and re check today. Swelling in her ankles is 25% better since been off medication. B/P is worse, readings at highest have been 171/71 but usually around 160/65. BP Readings from Last 3 Encounters:  05/25/17 (!) 174/62  05/04/17 122/64  04/20/17 122/64   Wt Readings from Last 3 Encounters:  05/25/17 179 lb (81.2 kg)  05/04/17 179 lb (81.2 kg)  04/20/17 179 lb (81.2 kg)    Patient Active Problem List   Diagnosis Date Noted  . Ankle edema, bilateral 05/04/2017  . Elevated troponin I level 08/25/2015  . Abnormal blood sugar 03/11/2015  . Allergic rhinitis 03/11/2015  . Absolute anemia 03/11/2015  . Arthropathia 03/11/2015  . Benign essential HTN 03/11/2015  . Benign essential tremor 03/11/2015  . Artery disease, cerebral 03/11/2015  . Chronic venous insufficiency 03/11/2015  . Clinical depression 03/11/2015  . Urinary system disease 03/11/2015  . Acid reflux 03/11/2015  . HLD (hyperlipidemia) 03/11/2015  . Adult hypothyroidism 03/11/2015  . Irritable colon 03/11/2015  . Cannot sleep 03/11/2015  . Weak pulse 03/11/2015  . Carotid stenosis 03/11/2015  . Temporary cerebral vascular dysfunction 03/11/2015  . External carotid artery stenosis 07/12/2014    No past medical history on file.  Social History   Social History  . Marital status: Divorced    Spouse name: na  . Number of children: 4  . Years of education: 14   Occupational History  . retired    Social History Main Topics  . Smoking status: Never Smoker  . Smokeless tobacco: Never Used  . Alcohol use No  . Drug use: No  . Sexual activity: No   Other Topics Concern  . Not on file   Social History Narrative  . No narrative on file     Outpatient Encounter Prescriptions as of 05/25/2017  Medication Sig Note  . allopurinol (ZYLOPRIM) 300 MG tablet TAKE ONE TABLET BY MOUTH ONCE DAILY   . amitriptyline (ELAVIL) 10 MG tablet TAKE TWO TABLETS BY MOUTH AT BEDTIME   . aspirin 81 MG tablet Take by mouth. 03/11/2015: Received from: [facility]  . atorvastatin (LIPITOR) 40 MG tablet TAKE ONE TABLET BY MOUTH ONCE DAILY   . clopidogrel (PLAVIX) 75 MG tablet TAKE ONE TABLET BY MOUTH ONCE DAILY   . dextromethorphan (ROBITUSSIN 12 HOUR COUGH) 30 MG/5ML liquid Take 5 mLs (30 mg total) by mouth as needed for cough.   . fluticasone (FLONASE) 50 MCG/ACT nasal spray Place 2 sprays into both nostrils daily.   . fluticasone (VERAMYST) 27.5 MCG/SPRAY nasal spray Place 2 sprays into the nose daily.   . hydrALAZINE (APRESOLINE) 50 MG tablet Take 1 tablet (50 mg total) by mouth 3 (three) times daily.   . hydrochlorothiazide (HYDRODIURIL) 25 MG tablet TAKE ONE TABLET BY MOUTH ONCE DAILY   . isosorbide mononitrate (IMDUR) 30 MG 24 hr tablet Take 1 tablet (30 mg total) by mouth every morning.   Marland Kitchen levothyroxine (SYNTHROID, LEVOTHROID) 137 MCG tablet TAKE ONE TABLET BY MOUTH ONCE DAILY   . loratadine (CLARITIN REDITABS) 10 MG dissolvable tablet Take 1 tablet (10 mg total) by mouth daily.   Marland Kitchen losartan (COZAAR) 100 MG tablet Take 1 tablet (100 mg total)  by mouth daily.   . meclizine (ANTIVERT) 25 MG tablet TAKE ONE TABLET BY MOUTH THREE TIMES DAILY AS NEEDED FOR DIZZINESS   . metoprolol tartrate (LOPRESSOR) 25 MG tablet Take 12.5 mg by mouth 2 (two) times daily.   . mirtazapine (REMERON) 30 MG tablet TAKE ONE TABLET BY MOUTH ONCE DAILY   . Misc Natural Products (GLUCOSAMINE-CHONDROITIN PLUS PO) Take 1 tablet by mouth daily.   . propranolol (INDERAL) 20 MG tablet Take 1 tablet (20 mg total) by mouth 2 (two) times daily. Patient to use up to two times per day as needed for tremor only   . ranitidine (ZANTAC) 150 MG capsule Take 1 capsule  (150 mg total) by mouth 2 (two) times daily.   . sertraline (ZOLOFT) 25 MG tablet Take 1 tablet (25 mg total) by mouth daily.    No facility-administered encounter medications on file as of 05/25/2017.     Allergies  Allergen Reactions  . Codeine   . Levofloxacin     Mouth sores  . Simvastatin     Myalgias    Review of Systems  Constitutional: Negative.   Eyes: Negative.   Respiratory: Positive for shortness of breath. Negative for cough.   Cardiovascular: Negative.        Ankle swelling  Gastrointestinal: Negative.   Musculoskeletal: Negative.   Skin: Negative.   Neurological: Negative.   Endo/Heme/Allergies: Negative.   Psychiatric/Behavioral: Negative.     Objective:  BP (!) 174/62   Pulse 66   Temp 97.8 F (36.6 C)   Resp 18   Wt 179 lb (81.2 kg)   BMI 32.74 kg/m   Physical Exam  Constitutional: She is oriented to person, place, and time and well-developed, well-nourished, and in no distress.  HENT:  Head: Normocephalic and atraumatic.  Eyes: Pupils are equal, round, and reactive to light. Conjunctivae are normal.  Neck: Normal range of motion. Neck supple.  Cardiovascular: Normal rate, regular rhythm, normal heart sounds and intact distal pulses.  Exam reveals no gallop.   No murmur heard. Pulmonary/Chest: Effort normal and breath sounds normal. No respiratory distress. She has no wheezes.  Abdominal: Soft.  Musculoskeletal: She exhibits edema (pedal edema.).  Neurological: She is alert and oriented to person, place, and time. Gait normal. GCS score is 15.  Skin: Skin is warm and dry.  Psychiatric: Mood, memory, affect and judgment normal.    Assessment and Plan :  1. Essential hypertension Worse off Amlodipine. Increase Hydralazine to 100 mg TID. Follow up in 1 month. - hydrALAZINE (APRESOLINE) 100 MG tablet; Take 1 tablet (100 mg total) by mouth 3 (three) times daily.  Dispense: 90 tablet; Refill: 3  2. Ankle edema, bilateral Better off  Amlodipine.  3. Need for influenza vaccination - Flu vaccine HIGH DOSE PF (Fluzone High dose)  HPI, Exam and A&P transcribed by Theressa Millard, RMA under direction and in the presence of Miguel Aschoff, MD. I have done the exam and reviewed the chart and it is accurate to the best of my knowledge. Development worker, community has been used and  any errors in dictation or transcription are unintentional. Miguel Aschoff M.D. [facility]

## 2017-05-28 DIAGNOSIS — I10 Essential (primary) hypertension: Secondary | ICD-10-CM | POA: Diagnosis not present

## 2017-05-28 DIAGNOSIS — R600 Localized edema: Secondary | ICD-10-CM | POA: Diagnosis not present

## 2017-05-28 DIAGNOSIS — R5381 Other malaise: Secondary | ICD-10-CM | POA: Diagnosis not present

## 2017-05-28 DIAGNOSIS — R0902 Hypoxemia: Secondary | ICD-10-CM | POA: Diagnosis not present

## 2017-05-28 DIAGNOSIS — Z7902 Long term (current) use of antithrombotics/antiplatelets: Secondary | ICD-10-CM | POA: Diagnosis not present

## 2017-05-28 DIAGNOSIS — G4700 Insomnia, unspecified: Secondary | ICD-10-CM | POA: Diagnosis not present

## 2017-05-28 DIAGNOSIS — I6529 Occlusion and stenosis of unspecified carotid artery: Secondary | ICD-10-CM | POA: Diagnosis not present

## 2017-05-28 DIAGNOSIS — M40209 Unspecified kyphosis, site unspecified: Secondary | ICD-10-CM | POA: Diagnosis not present

## 2017-05-28 DIAGNOSIS — Z885 Allergy status to narcotic agent status: Secondary | ICD-10-CM | POA: Diagnosis not present

## 2017-05-28 DIAGNOSIS — R079 Chest pain, unspecified: Secondary | ICD-10-CM | POA: Diagnosis not present

## 2017-05-28 DIAGNOSIS — K219 Gastro-esophageal reflux disease without esophagitis: Secondary | ICD-10-CM | POA: Diagnosis not present

## 2017-05-28 DIAGNOSIS — I160 Hypertensive urgency: Secondary | ICD-10-CM | POA: Diagnosis not present

## 2017-05-28 DIAGNOSIS — R0602 Shortness of breath: Secondary | ICD-10-CM | POA: Diagnosis not present

## 2017-05-28 DIAGNOSIS — G43909 Migraine, unspecified, not intractable, without status migrainosus: Secondary | ICD-10-CM | POA: Diagnosis not present

## 2017-05-28 DIAGNOSIS — R9431 Abnormal electrocardiogram [ECG] [EKG]: Secondary | ICD-10-CM | POA: Diagnosis not present

## 2017-05-28 DIAGNOSIS — Z8673 Personal history of transient ischemic attack (TIA), and cerebral infarction without residual deficits: Secondary | ICD-10-CM | POA: Diagnosis not present

## 2017-05-28 DIAGNOSIS — R5383 Other fatigue: Secondary | ICD-10-CM | POA: Diagnosis not present

## 2017-05-28 DIAGNOSIS — Z7982 Long term (current) use of aspirin: Secondary | ICD-10-CM | POA: Diagnosis not present

## 2017-05-28 DIAGNOSIS — I440 Atrioventricular block, first degree: Secondary | ICD-10-CM | POA: Diagnosis not present

## 2017-05-28 DIAGNOSIS — I447 Left bundle-branch block, unspecified: Secondary | ICD-10-CM | POA: Diagnosis not present

## 2017-05-28 DIAGNOSIS — E039 Hypothyroidism, unspecified: Secondary | ICD-10-CM | POA: Diagnosis not present

## 2017-05-29 DIAGNOSIS — Z8673 Personal history of transient ischemic attack (TIA), and cerebral infarction without residual deficits: Secondary | ICD-10-CM | POA: Diagnosis not present

## 2017-05-29 DIAGNOSIS — R0902 Hypoxemia: Secondary | ICD-10-CM | POA: Diagnosis not present

## 2017-05-29 DIAGNOSIS — I10 Essential (primary) hypertension: Secondary | ICD-10-CM | POA: Diagnosis not present

## 2017-05-29 DIAGNOSIS — R5383 Other fatigue: Secondary | ICD-10-CM | POA: Diagnosis not present

## 2017-06-02 ENCOUNTER — Ambulatory Visit (INDEPENDENT_AMBULATORY_CARE_PROVIDER_SITE_OTHER): Payer: Medicare Other | Admitting: Family Medicine

## 2017-06-02 VITALS — BP 144/52 | HR 80 | Temp 97.9°F | Resp 18 | Wt 179.2 lb

## 2017-06-02 DIAGNOSIS — E876 Hypokalemia: Secondary | ICD-10-CM | POA: Diagnosis not present

## 2017-06-02 DIAGNOSIS — E039 Hypothyroidism, unspecified: Secondary | ICD-10-CM

## 2017-06-02 DIAGNOSIS — I10 Essential (primary) hypertension: Secondary | ICD-10-CM | POA: Diagnosis not present

## 2017-06-02 DIAGNOSIS — M25471 Effusion, right ankle: Secondary | ICD-10-CM | POA: Diagnosis not present

## 2017-06-02 DIAGNOSIS — M25472 Effusion, left ankle: Secondary | ICD-10-CM

## 2017-06-02 NOTE — Progress Notes (Signed)
Stephanie Charles  MRN: [id number] DOB: [birth year]  Subjective:  HPI   Patient is here to follow after ER visit on 05/28/17. Patient was seen on 05/26/17 at our office. At that time increase Hydralazine 100 mg to 3 times daily and edema was better off Amlodipine. Patient went to ER at [facility] on 05/28/17 due to feeling staggery/shakey. Her b/p reading there was 239/105. She was kept over night. Medication changes were made as follows: Start Carvedilol BID, restart Amlodipine at 2.5 mg, take Hydralazine at 25 mg 1 TID. Stop metoprolol. Also want her to wean off Amitriptyline and try Melatonin instead. She was advised to take Ranitidine only as needed. Referral was made to see geriatric doctor for 06/14/17. Potassium level was low and she was given 4 potassium tablets on 05/29/17. TSH level was abnormal also.  Patient states she still feels shaky. Patient has been checking her b/p and highest reading was 145/65 and lower.  BP Readings from Last 3 Encounters:  06/02/17 (!) 144/52  05/25/17 (!) 174/62  05/04/17 122/64   Wt Readings from Last 3 Encounters:  06/02/17 179 lb 3.2 oz (81.3 kg)  05/25/17 179 lb (81.2 kg)  05/04/17 179 lb (81.2 kg)     Patient Active Problem List   Diagnosis Date Noted  . Ankle edema, bilateral 05/04/2017  . Elevated troponin I level 08/25/2015  . Abnormal blood sugar 03/11/2015  . Allergic rhinitis 03/11/2015  . Absolute anemia 03/11/2015  . Arthropathia 03/11/2015  . Benign essential HTN 03/11/2015  . Benign essential tremor 03/11/2015  . Artery disease, cerebral 03/11/2015  . Chronic venous insufficiency 03/11/2015  . Clinical depression 03/11/2015  . Urinary system disease 03/11/2015  . Acid reflux 03/11/2015  . HLD (hyperlipidemia) 03/11/2015  . Adult hypothyroidism 03/11/2015  . Irritable colon 03/11/2015  . Cannot sleep 03/11/2015  . Weak pulse 03/11/2015  . Carotid stenosis 03/11/2015  . Temporary cerebral vascular dysfunction 03/11/2015   . External carotid artery stenosis 07/12/2014    No past medical history on file.  Social History   Social History  . Marital status: Divorced    Spouse name: na  . Number of children: 4  . Years of education: 14   Occupational History  . retired    Social History Main Topics  . Smoking status: Never Smoker  . Smokeless tobacco: Never Used  . Alcohol use No  . Drug use: No  . Sexual activity: No   Other Topics Concern  . Not on file   Social History Narrative  . No narrative on file    Outpatient Encounter Prescriptions as of 06/02/2017  Medication Sig Note  . allopurinol (ZYLOPRIM) 300 MG tablet TAKE ONE TABLET BY MOUTH ONCE DAILY   . amitriptyline (ELAVIL) 10 MG tablet TAKE TWO TABLETS BY MOUTH AT BEDTIME   . amLODipine (NORVASC) 2.5 MG tablet Take 1 tablet by mouth daily.   Marland Kitchen aspirin 81 MG tablet Take by mouth. 03/11/2015: Received from: [facility]  . atorvastatin (LIPITOR) 40 MG tablet TAKE ONE TABLET BY MOUTH ONCE DAILY   . carvedilol (COREG) 3.125 MG tablet Take 1 tablet by mouth 2 (two) times daily.   . clopidogrel (PLAVIX) 75 MG tablet TAKE ONE TABLET BY MOUTH ONCE DAILY   . dextromethorphan (ROBITUSSIN 12 HOUR COUGH) 30 MG/5ML liquid Take 5 mLs (30 mg total) by mouth as needed for cough.   . fluticasone (FLONASE) 50 MCG/ACT nasal spray Place 2 sprays into both nostrils daily.   Marland Kitchen  fluticasone (VERAMYST) 27.5 MCG/SPRAY nasal spray Place 2 sprays into the nose daily.   . hydrALAZINE (APRESOLINE) 25 MG tablet Take 1 tablet by mouth 3 (three) times daily.   . hydrochlorothiazide (HYDRODIURIL) 25 MG tablet TAKE ONE TABLET BY MOUTH ONCE DAILY   . isosorbide mononitrate (IMDUR) 30 MG 24 hr tablet Take 1 tablet (30 mg total) by mouth every morning.   Marland Kitchen levothyroxine (SYNTHROID, LEVOTHROID) 137 MCG tablet TAKE ONE TABLET BY MOUTH ONCE DAILY   . loratadine (CLARITIN REDITABS) 10 MG dissolvable tablet Take 1 tablet (10 mg total) by mouth daily.   Marland Kitchen  losartan (COZAAR) 100 MG tablet Take 1 tablet (100 mg total) by mouth daily.   . meclizine (ANTIVERT) 25 MG tablet TAKE ONE TABLET BY MOUTH THREE TIMES DAILY AS NEEDED FOR DIZZINESS   . mirtazapine (REMERON) 30 MG tablet TAKE ONE TABLET BY MOUTH ONCE DAILY   . Misc Natural Products (GLUCOSAMINE-CHONDROITIN PLUS PO) Take 1 tablet by mouth daily.   . propranolol (INDERAL) 20 MG tablet Take 1 tablet (20 mg total) by mouth 2 (two) times daily. Patient to use up to two times per day as needed for tremor only   . ranitidine (ZANTAC) 150 MG capsule Take 1 capsule (150 mg total) by mouth 2 (two) times daily.   . sertraline (ZOLOFT) 25 MG tablet Take 1 tablet (25 mg total) by mouth daily.   . [DISCONTINUED] hydrALAZINE (APRESOLINE) 100 MG tablet Take 1 tablet (100 mg total) by mouth 3 (three) times daily.   . [DISCONTINUED] metoprolol tartrate (LOPRESSOR) 25 MG tablet Take 12.5 mg by mouth 2 (two) times daily.    No facility-administered encounter medications on file as of 06/02/2017.     Allergies  Allergen Reactions  . Codeine   . Levofloxacin     Mouth sores  . Simvastatin     Myalgias    Review of Systems  Constitutional: Negative.   Respiratory: Negative.   Cardiovascular: Negative.   Musculoskeletal: Negative.   Neurological:       Shaky sensation.    Objective:  BP (!) 144/52   Pulse 80   Temp 97.9 F (36.6 C)   Resp 18   Wt 179 lb 3.2 oz (81.3 kg)   BMI 32.78 kg/m   Physical Exam  Constitutional: She is oriented to person, place, and time and well-developed, well-nourished, and in no distress.  HENT:  Head: Normocephalic and atraumatic.  Eyes: Pupils are equal, round, and reactive to light. Conjunctivae are normal.  Neck: Normal range of motion. Neck supple.  Cardiovascular: Normal rate, regular rhythm, normal heart sounds and intact distal pulses.  Exam reveals no gallop.   No murmur heard. Pulmonary/Chest: Effort normal and breath sounds normal. No respiratory  distress. She has no wheezes.  Musculoskeletal: She exhibits no edema or tenderness.  Neurological: She is alert and oriented to person, place, and time.  Psychiatric: Mood, memory, affect and judgment normal.    Assessment and Plan :  1. Essential hypertension Better. Reviewed ER records from 05/28/17-05/29/17. Patient advised to keep appointment with geriatrics on 06/14/17. Will recheck lab work today. - Renal Function Panel  2. Ankle edema, bilateral Improved. - Renal Function Panel  3. Adult hypothyroidism Re check level. If level is still abnormal may need to adjust her medication. - TSH  4. Low serum potassium level Repeat today. - Renal Function Panel 5.Depression/GAD Meds being trimmed by geriatrics.  HPI, Exam and A&P transcribed by Tiffany Kocher, RMA under direction  and in the presence of Miguel Aschoff, MD. I have done the exam and reviewed the chart and it is accurate to the best of my knowledge. Development worker, community has been used and  any errors in dictation or transcription are unintentional. Miguel Aschoff M.D. [facility]

## 2017-06-03 LAB — TSH: TSH: 5.52 u[IU]/mL — ABNORMAL HIGH (ref 0.450–4.500)

## 2017-06-03 LAB — RENAL FUNCTION PANEL
ALBUMIN: 4.5 g/dL (ref 3.5–4.7)
BUN / CREAT RATIO: 17 (ref 12–28)
BUN: 18 mg/dL (ref 8–27)
CHLORIDE: 95 mmol/L — AB (ref 96–106)
CO2: 26 mmol/L (ref 20–29)
Calcium: 9.7 mg/dL (ref 8.7–10.3)
Creatinine, Ser: 1.07 mg/dL — ABNORMAL HIGH (ref 0.57–1.00)
GFR, EST AFRICAN AMERICAN: 55 mL/min/{1.73_m2} — AB (ref >59)
GFR, EST NON AFRICAN AMERICAN: 48 mL/min/{1.73_m2} — AB (ref >59)
GLUCOSE: 99 mg/dL (ref 65–99)
POTASSIUM: 4.2 mmol/L (ref 3.5–5.2)
Phosphorus: 3.1 mg/dL (ref 2.5–4.5)
SODIUM: 137 mmol/L (ref 134–144)

## 2017-06-07 ENCOUNTER — Telehealth: Payer: Self-pay | Admitting: Family Medicine

## 2017-06-07 NOTE — Telephone Encounter (Signed)
Pt wants results for her test last week.  Her call back I [phone]  Thanks Con Memos

## 2017-06-14 DIAGNOSIS — E039 Hypothyroidism, unspecified: Secondary | ICD-10-CM | POA: Diagnosis not present

## 2017-06-14 DIAGNOSIS — M109 Gout, unspecified: Secondary | ICD-10-CM | POA: Diagnosis not present

## 2017-06-15 DIAGNOSIS — M109 Gout, unspecified: Secondary | ICD-10-CM | POA: Diagnosis not present

## 2017-06-24 ENCOUNTER — Other Ambulatory Visit: Payer: Self-pay | Admitting: Emergency Medicine

## 2017-06-24 MED ORDER — HYDRALAZINE HCL 25 MG PO TABS: 25.0000 mg | ORAL_TABLET | Freq: Three times a day (TID) | ORAL | 3 refills | Status: DC

## 2017-06-24 MED ORDER — CARVEDILOL 3.125 MG PO TABS: 3.1250 mg | ORAL_TABLET | Freq: Two times a day (BID) | ORAL | 3 refills | Status: DC

## 2017-06-27 ENCOUNTER — Ambulatory Visit: Payer: Self-pay | Admitting: Family Medicine

## 2017-07-05 ENCOUNTER — Telehealth: Payer: Self-pay | Admitting: Family Medicine

## 2017-07-07 ENCOUNTER — Ambulatory Visit (INDEPENDENT_AMBULATORY_CARE_PROVIDER_SITE_OTHER): Payer: Medicare Other | Admitting: Family Medicine

## 2017-07-07 ENCOUNTER — Encounter: Payer: Self-pay | Admitting: Family Medicine

## 2017-07-07 VITALS — BP 142/62 | HR 66 | Temp 97.8°F | Resp 16 | Ht 63.0 in | Wt 172.0 lb

## 2017-07-07 DIAGNOSIS — I10 Essential (primary) hypertension: Secondary | ICD-10-CM | POA: Diagnosis not present

## 2017-07-07 DIAGNOSIS — H60501 Unspecified acute noninfective otitis externa, right ear: Secondary | ICD-10-CM | POA: Diagnosis not present

## 2017-07-07 DIAGNOSIS — R42 Dizziness and giddiness: Secondary | ICD-10-CM | POA: Diagnosis not present

## 2017-07-07 MED ORDER — MOMETASONE FUROATE 0.1 % EX CREA: 1.0000 "application " | TOPICAL_CREAM | Freq: Every day | CUTANEOUS | 0 refills | Status: DC

## 2017-07-07 NOTE — Progress Notes (Signed)
Patient: Stephanie Charles Female    DOB: 04-19-1930   83 y.o.   MRN: [id number] Visit Date: 07/07/2017  Today's Provider: [name], MD   Chief Complaint  Patient presents with  . Hypertension  . Dizziness   Subjective:    HPI  Hypertension, follow-up:  BP Readings from Last 3 Encounters:  07/07/17 (!) 142/62  06/02/17 (!) 144/52  05/25/17 (!) 174/62    She was last seen for hypertension 3 months ago.  BP at that visit was 144/52. Management since that visit includes none. She reports good compliance with treatment. She is not having side effects.  Outside blood pressures are 130's/50-60's. She is experiencing dyspnea but is not changed from base line Patient denies chest pain, chest pressure/discomfort, claudication, exertional chest pressure/discomfort, fatigue, irregular heart beat, lower extremity edema, near-syncope, orthopnea, palpitations, paroxysmal nocturnal dyspnea, syncope and tachypnea.   Wt Readings from Last 3 Encounters:  07/07/17 172 lb (78 kg)  06/02/17 179 lb 3.2 oz (81.3 kg)  05/25/17 179 lb (81.2 kg)   ------------------------------------------------------------------------  Pt reports that she has not been feeling well. She has been dizzy and her right ear has been hurting and draining a yellow foul odor fluid. She reports that she has had vertigo before and this feels similar. She has been better today and yesterday than she has been the last 2 weeks since all this started. She also says when she was dizzy she closes her eyes and one time she was laying down and closing her eyes and when she opened them, she could not see. She has also been having problems with her right heel, she thinks it is her gout. She has been very busy with her daughter.      Allergies  Allergen Reactions  . Codeine   . Levofloxacin     Mouth sores  . Simvastatin     Myalgias     Current Outpatient Prescriptions:  .  allopurinol (ZYLOPRIM) 300 MG  tablet, TAKE ONE TABLET BY MOUTH ONCE DAILY, Disp: 30 tablet, Rfl: 12 .  amLODipine (NORVASC) 2.5 MG tablet, Take 1 tablet by mouth daily., Disp: , Rfl:  .  aspirin 81 MG tablet, Take by mouth., Disp: , Rfl:  .  atorvastatin (LIPITOR) 40 MG tablet, TAKE ONE TABLET BY MOUTH ONCE DAILY, Disp: 90 tablet, Rfl: 3 .  carvedilol (COREG) 3.125 MG tablet, Take 1 tablet (3.125 mg total) by mouth 2 (two) times daily., Disp: 180 tablet, Rfl: 3 .  clopidogrel (PLAVIX) 75 MG tablet, TAKE ONE TABLET BY MOUTH ONCE DAILY, Disp: 90 tablet, Rfl: 3 .  fluticasone (FLONASE) 50 MCG/ACT nasal spray, Place 2 sprays into both nostrils daily., Disp: 16 g, Rfl: 11 .  fluticasone (VERAMYST) 27.5 MCG/SPRAY nasal spray, Place 2 sprays into the nose daily., Disp: 10 g, Rfl: 12 .  hydrALAZINE (APRESOLINE) 25 MG tablet, Take 1 tablet (25 mg total) by mouth 3 (three) times daily., Disp: 270 tablet, Rfl: 3 .  hydrochlorothiazide (HYDRODIURIL) 25 MG tablet, TAKE ONE TABLET BY MOUTH ONCE DAILY, Disp: 90 tablet, Rfl: 3 .  levothyroxine (SYNTHROID, LEVOTHROID) 137 MCG tablet, TAKE ONE TABLET BY MOUTH ONCE DAILY, Disp: 90 tablet, Rfl: 4 .  losartan (COZAAR) 100 MG tablet, Take 1 tablet (100 mg total) by mouth daily., Disp: 90 tablet, Rfl: 3 .  Melatonin 3 MG TBDP, Take 1 tablet by mouth at bedtime., Disp: , Rfl:  .  mirtazapine (REMERON) 15 MG tablet, Take  15 mg by mouth at bedtime., Disp: , Rfl:  .  Misc Natural Products (GLUCOSAMINE-CHONDROITIN PLUS PO), Take 1 tablet by mouth daily., Disp: , Rfl:  .  ranitidine (ZANTAC) 150 MG capsule, Take 1 capsule (150 mg total) by mouth 2 (two) times daily., Disp: 60 capsule, Rfl: 12 .  sertraline (ZOLOFT) 25 MG tablet, Take 1 tablet (25 mg total) by mouth daily., Disp: 90 tablet, Rfl: 3 .  dextromethorphan (ROBITUSSIN 12 HOUR COUGH) 30 MG/5ML liquid, Take 5 mLs (30 mg total) by mouth as needed for cough. (Patient not taking: Reported on 07/07/2017), Disp: 89 mL, Rfl: 0 .  isosorbide mononitrate  (IMDUR) 30 MG 24 hr tablet, Take 1 tablet (30 mg total) by mouth every morning. (Patient not taking: Reported on 07/07/2017), Disp: 30 tablet, Rfl: 12 .  loratadine (CLARITIN REDITABS) 10 MG dissolvable tablet, Take 1 tablet (10 mg total) by mouth daily. (Patient not taking: Reported on 07/07/2017), Disp: 30 tablet, Rfl: 11 .  meclizine (ANTIVERT) 25 MG tablet, TAKE ONE TABLET BY MOUTH THREE TIMES DAILY AS NEEDED FOR DIZZINESS (Patient not taking: Reported on 07/07/2017), Disp: 30 tablet, Rfl: 12 .  propranolol (INDERAL) 20 MG tablet, Take 1 tablet (20 mg total) by mouth 2 (two) times daily. Patient to use up to two times per day as needed for tremor only (Patient not taking: Reported on 07/07/2017), Disp: 60 tablet, Rfl: 1  Review of Systems  Constitutional: Negative.   HENT: Positive for ear discharge and ear pain.   Eyes: Negative.   Respiratory: Negative.   Cardiovascular: Negative.   Gastrointestinal: Negative.   Endocrine: Negative.   Genitourinary: Negative.   Musculoskeletal: Negative.   Skin: Negative.   Allergic/Immunologic: Negative.   Neurological: Positive for dizziness.  Hematological: Negative.   Psychiatric/Behavioral: The patient is nervous/anxious.     Social History  Substance Use Topics  . Smoking status: Never Smoker  . Smokeless tobacco: Never Used  . Alcohol use No   Objective:   BP (!) 142/62 (BP Location: Left Arm, Patient Position: Sitting, Cuff Size: Normal)   Pulse 66   Temp 97.8 F (36.6 C) (Oral)   Resp 16   Wt 172 lb (78 kg)   SpO2 98%   BMI 31.46 kg/m  Vitals:   07/07/17 0818  BP: (!) 142/62  Pulse: 66  Resp: 16  Temp: 97.8 F (36.6 C)  TempSrc: Oral  SpO2: 98%  Weight: 172 lb (78 kg)     Physical Exam  Constitutional: She is oriented to person, place, and time. She appears well-developed and well-nourished.  HENT:  Head: Normocephalic and atraumatic.  Left Ear: External ear normal.  Nose: Nose normal.  Mouth/Throat: Oropharynx is  clear and moist.  Fluid behind right TM, otitis externa right ear   Eyes: Pupils are equal, round, and reactive to light. Conjunctivae and EOM are normal.  nystagmus to the left  Neck: Normal range of motion. Neck supple.  Cardiovascular: Normal rate, regular rhythm, normal heart sounds and intact distal pulses.   Pulmonary/Chest: Effort normal and breath sounds normal.  Abdominal: Soft.  Musculoskeletal: Normal range of motion.  Achilles of the calcaneus   Neurological: She is alert and oriented to person, place, and time. She has normal reflexes.  Skin: Skin is warm and dry.  Psychiatric: She has a normal mood and affect. Her behavior is normal. Judgment and thought content normal.        Assessment & Plan:     1. Essential hypertension  2. Dizziness/Mild Vertigo  - EKG 12-Lead  3. Acute otitis externa of right ear, unspecified type  - mometasone (ELOCON) 0.1 % cream; Apply 1 application topically daily.  Dispense: 45 g; Refill: 0 4.First Degree AV Block   Consider cardiology referral.  HPI, Exam, and A&P Transcribed under the direction and in the presence of Richard L. Cranford Mon, MD  Electronically Signed: Katina Dung, [place], MD  [facility]

## 2017-08-04 DIAGNOSIS — L578 Other skin changes due to chronic exposure to nonionizing radiation: Secondary | ICD-10-CM | POA: Diagnosis not present

## 2017-08-04 DIAGNOSIS — L570 Actinic keratosis: Secondary | ICD-10-CM | POA: Diagnosis not present

## 2017-08-04 DIAGNOSIS — L918 Other hypertrophic disorders of the skin: Secondary | ICD-10-CM | POA: Diagnosis not present

## 2017-08-04 DIAGNOSIS — I219 Acute myocardial infarction, unspecified: Secondary | ICD-10-CM

## 2017-08-04 DIAGNOSIS — Z872 Personal history of diseases of the skin and subcutaneous tissue: Secondary | ICD-10-CM | POA: Diagnosis not present

## 2017-08-04 DIAGNOSIS — D485 Neoplasm of uncertain behavior of skin: Secondary | ICD-10-CM | POA: Diagnosis not present

## 2017-08-04 HISTORY — DX: Acute myocardial infarction, unspecified: I21.9

## 2017-08-08 ENCOUNTER — Other Ambulatory Visit: Payer: Self-pay | Admitting: Family Medicine

## 2017-08-08 DIAGNOSIS — I10 Essential (primary) hypertension: Secondary | ICD-10-CM

## 2017-08-18 DIAGNOSIS — D3611 Benign neoplasm of peripheral nerves and autonomic nervous system of face, head, and neck: Secondary | ICD-10-CM | POA: Diagnosis not present

## 2017-08-18 DIAGNOSIS — D485 Neoplasm of uncertain behavior of skin: Secondary | ICD-10-CM | POA: Diagnosis not present

## 2017-08-23 ENCOUNTER — Other Ambulatory Visit: Payer: Self-pay | Admitting: Family Medicine

## 2017-08-29 ENCOUNTER — Telehealth: Payer: Self-pay | Admitting: Family Medicine

## 2017-08-29 ENCOUNTER — Other Ambulatory Visit: Payer: Self-pay | Admitting: Family Medicine

## 2017-08-29 DIAGNOSIS — Z01818 Encounter for other preprocedural examination: Secondary | ICD-10-CM | POA: Diagnosis not present

## 2017-08-29 DIAGNOSIS — Z8673 Personal history of transient ischemic attack (TIA), and cerebral infarction without residual deficits: Secondary | ICD-10-CM | POA: Diagnosis not present

## 2017-08-29 DIAGNOSIS — I6529 Occlusion and stenosis of unspecified carotid artery: Secondary | ICD-10-CM | POA: Diagnosis not present

## 2017-08-29 DIAGNOSIS — I10 Essential (primary) hypertension: Secondary | ICD-10-CM | POA: Diagnosis not present

## 2017-08-29 DIAGNOSIS — E039 Hypothyroidism, unspecified: Secondary | ICD-10-CM | POA: Diagnosis not present

## 2017-08-29 NOTE — Telephone Encounter (Signed)
Please review-Anastasiya V Hopkins, RMA  

## 2017-08-29 NOTE — Telephone Encounter (Signed)
Patient is having surgery on 09/09/2017 and the surgeon told her to call her PCP to find out how long to be off of her blood thinner and Asprin before surgery.  Lyan gave verbal permission while on the phone with her daughter for [name] to be able to call her back with this information.  Please advise.

## 2017-08-30 NOTE — Telephone Encounter (Signed)
Pt also called to get a refill for mirtazapine (REMERON) 15 MG tablet.    [address]

## 2017-08-30 NOTE — Telephone Encounter (Signed)
Up to surgeon.

## 2017-08-31 ENCOUNTER — Other Ambulatory Visit: Payer: Self-pay

## 2017-08-31 MED ORDER — MIRTAZAPINE 15 MG PO TABS: 15.0000 mg | ORAL_TABLET | Freq: Every day | ORAL | 3 refills | Status: DC

## 2017-08-31 NOTE — Telephone Encounter (Signed)
Talked to Dr Rosanna Randy about this, surgeon office asked patient to ask PCP about this and that is why they called [name]. Per dr Rosanna Randy stop blood thinners and Aspirin 3 days before the surgery. Daughter Harolyn Rutherford, RMA

## 2017-09-05 ENCOUNTER — Other Ambulatory Visit: Payer: Self-pay

## 2017-09-05 ENCOUNTER — Ambulatory Visit (INDEPENDENT_AMBULATORY_CARE_PROVIDER_SITE_OTHER): Payer: Medicare Other | Admitting: Family Medicine

## 2017-09-05 ENCOUNTER — Encounter: Payer: Self-pay | Admitting: Family Medicine

## 2017-09-05 VITALS — BP 168/72 | HR 82 | Temp 98.0°F | Resp 18 | Wt 172.0 lb

## 2017-09-05 DIAGNOSIS — F3341 Major depressive disorder, recurrent, in partial remission: Secondary | ICD-10-CM

## 2017-09-05 DIAGNOSIS — F419 Anxiety disorder, unspecified: Secondary | ICD-10-CM

## 2017-09-05 DIAGNOSIS — I10 Essential (primary) hypertension: Secondary | ICD-10-CM | POA: Diagnosis not present

## 2017-09-05 DIAGNOSIS — R42 Dizziness and giddiness: Secondary | ICD-10-CM | POA: Diagnosis not present

## 2017-09-05 NOTE — Progress Notes (Signed)
Stephanie Charles  MRN: [id number] DOB: 1930-08-29  Subjective:  HPI   The patient is an 83 year old female who presents for follow up of her dizziness.  She has seen cardiology and they discontinued her beta blocker.  Our medicine list had Propranolol and Carvedilol.  The patient states she is not taking either of those pills.  She reports that since the change in the medicine she is no longer dizzy.    She also states that she had been taken off of the Amitriptiline at some point in the past but did not remember by who or why.  She states she was haviing trouble sleeping and started herself back on 1 at night.  Patient does not have follow up with cardiology as yet but is supposed to make an appointment.  She has eye surgery scheduled in 4 days and wanted to get through that before going back for the follow up.  Patient Active Problem List   Diagnosis Date Noted  . Ankle edema, bilateral 05/04/2017  . Elevated troponin I level 08/25/2015  . Abnormal blood sugar 03/11/2015  . Allergic rhinitis 03/11/2015  . Absolute anemia 03/11/2015  . Arthropathia 03/11/2015  . Benign essential HTN 03/11/2015  . Benign essential tremor 03/11/2015  . Artery disease, cerebral 03/11/2015  . Chronic venous insufficiency 03/11/2015  . Clinical depression 03/11/2015  . Urinary system disease 03/11/2015  . Acid reflux 03/11/2015  . HLD (hyperlipidemia) 03/11/2015  . Adult hypothyroidism 03/11/2015  . Irritable colon 03/11/2015  . Cannot sleep 03/11/2015  . Weak pulse 03/11/2015  . Carotid stenosis 03/11/2015  . Temporary cerebral vascular dysfunction 03/11/2015  . External carotid artery stenosis 07/12/2014    No past medical history on file.  Social History   Socioeconomic History  . Marital status: Divorced    Spouse name: na  . Number of children: 4  . Years of education: 29  . Highest education level: Not on file  Social Needs  . Financial resource strain: Not on file  . Food  insecurity - worry: Not on file  . Food insecurity - inability: Not on file  . Transportation needs - medical: Not on file  . Transportation needs - non-medical: Not on file  Occupational History  . Occupation: retired  Tobacco Use  . Smoking status: Never Smoker  . Smokeless tobacco: Never Used  Substance and Sexual Activity  . Alcohol use: No    Alcohol/week: 0.0 oz  . Drug use: No  . Sexual activity: No  Other Topics Concern  . Not on file  Social History Narrative  . Not on file    Outpatient Encounter Medications as of 09/05/2017  Medication Sig Note  . allopurinol (ZYLOPRIM) 300 MG tablet TAKE ONE TABLET BY MOUTH ONCE DAILY   . amitriptyline (ELAVIL) 10 MG tablet Take 10 mg by mouth at bedtime.   Marland Kitchen amLODipine (NORVASC) 2.5 MG tablet Take 1 tablet by mouth daily.   Marland Kitchen aspirin 81 MG tablet Take by mouth. 03/11/2015: Received from: [facility]  . atorvastatin (LIPITOR) 40 MG tablet TAKE ONE TABLET BY MOUTH ONCE DAILY   . carvedilol (COREG) 3.125 MG tablet Take 1 tablet (3.125 mg total) by mouth 2 (two) times daily.   . clopidogrel (PLAVIX) 75 MG tablet TAKE ONE TABLET BY MOUTH ONCE DAILY   . fluticasone (FLONASE) 50 MCG/ACT nasal spray Place 2 sprays into both nostrils daily.   . hydrALAZINE (APRESOLINE) 25 MG tablet Take 1 tablet (25 mg  total) by mouth 3 (three) times daily.   . hydrochlorothiazide (HYDRODIURIL) 25 MG tablet TAKE ONE TABLET BY MOUTH ONCE DAILY   . isosorbide mononitrate (IMDUR) 30 MG 24 hr tablet Take 1 tablet (30 mg total) by mouth every morning.   Marland Kitchen levothyroxine (SYNTHROID, LEVOTHROID) 137 MCG tablet TAKE ONE TABLET BY MOUTH ONCE DAILY   . losartan (COZAAR) 100 MG tablet TAKE ONE TABLET BY MOUTH ONCE DAILY   . meclizine (ANTIVERT) 25 MG tablet TAKE ONE TABLET BY MOUTH THREE TIMES DAILY AS NEEDED FOR DIZZINESS   . Melatonin 3 MG TBDP Take 1 tablet by mouth at bedtime.   . mirtazapine (REMERON) 15 MG tablet Take 1 tablet (15 mg total) by mouth  at bedtime.   . Misc Natural Products (GLUCOSAMINE-CHONDROITIN PLUS PO) Take 1 tablet by mouth daily.   . mometasone (ELOCON) 0.1 % cream Apply 1 application topically daily.   . ranitidine (ZANTAC) 150 MG capsule Take 1 capsule (150 mg total) by mouth 2 (two) times daily.   . sertraline (ZOLOFT) 25 MG tablet Take 1 tablet (25 mg total) by mouth daily.   . [DISCONTINUED] dextromethorphan (ROBITUSSIN 12 HOUR COUGH) 30 MG/5ML liquid Take 5 mLs (30 mg total) by mouth as needed for cough. (Patient not taking: Reported on 07/07/2017)   . [DISCONTINUED] fluticasone (VERAMYST) 27.5 MCG/SPRAY nasal spray Place 2 sprays into the nose daily.   . [DISCONTINUED] loratadine (CLARITIN REDITABS) 10 MG dissolvable tablet Take 1 tablet (10 mg total) by mouth daily. (Patient not taking: Reported on 07/07/2017)   . [DISCONTINUED] propranolol (INDERAL) 20 MG tablet Take 1 tablet (20 mg total) by mouth 2 (two) times daily. Patient to use up to two times per day as needed for tremor only (Patient not taking: Reported on 07/07/2017)    No facility-administered encounter medications on file as of 09/05/2017.     Allergies  Allergen Reactions  . Codeine   . Levofloxacin     Mouth sores  . Simvastatin     Myalgias    Review of Systems  Constitutional: Negative for fever and malaise/fatigue.  Eyes: Negative.   Respiratory: Positive for wheezing. Negative for cough and shortness of breath.   Cardiovascular: Positive for palpitations (unchanged. ) and orthopnea. Negative for chest pain, claudication and leg swelling.  Gastrointestinal: Negative.   Musculoskeletal: Positive for joint pain.  Skin: Negative.   Neurological: Negative for dizziness, weakness and headaches.  Endo/Heme/Allergies: Negative.   Psychiatric/Behavioral: Negative.     Objective:  BP (!) 168/72 (BP Location: Right Arm, Patient Position: Sitting, Cuff Size: Normal)   Pulse 82   Temp 98 F (36.7 C) (Oral)   Resp 18   Wt 172 lb (78 kg)   BMI  30.47 kg/m   Physical Exam  Constitutional: She is oriented to person, place, and time and well-developed, well-nourished, and in no distress.  HENT:  Head: Normocephalic and atraumatic.  Eyes: Conjunctivae are normal. Pupils are equal, round, and reactive to light.  Mild nystagmus to the left.  Neck: Normal range of motion. No thyromegaly present.  Cardiovascular: Normal rate, regular rhythm and normal heart sounds.  Pulmonary/Chest: Effort normal and breath sounds normal.  Abdominal: Soft.  Musculoskeletal: She exhibits edema (trace).  Neurological: She is alert and oriented to person, place, and time.  Skin: Skin is warm and dry.  Psychiatric: Mood, memory, affect and judgment normal.    Assessment and Plan :  Dizziness/vertigo Improved. May need further treatment. HTN HLD Chronic Insomnia Chronic  Anxiety  I have done the exam and reviewed the chart and it is accurate to the best of my knowledge. Development worker, community has been used and  any errors in dictation or transcription are unintentional. Miguel Aschoff M.D. [facility]

## 2017-09-08 ENCOUNTER — Telehealth: Payer: Self-pay | Admitting: Family Medicine

## 2017-09-08 NOTE — Telephone Encounter (Signed)
Pt states she would like to speak with [facility] about nerve pain.  States she spoke with her about this when she was in a few days ago.

## 2017-09-08 NOTE — Telephone Encounter (Signed)
Patient had questions about sciatica nerve pain.  Have given her some exercises and advise on helping the pain.  She has been instructed if she does not get better or gets any worse she is to let us know.  If she wished PT she is to call and we can get it ordered.

## 2017-09-09 ENCOUNTER — Telehealth: Payer: Self-pay | Admitting: Family Medicine

## 2017-09-09 DIAGNOSIS — M40209 Unspecified kyphosis, site unspecified: Secondary | ICD-10-CM | POA: Diagnosis not present

## 2017-09-09 DIAGNOSIS — Z8673 Personal history of transient ischemic attack (TIA), and cerebral infarction without residual deficits: Secondary | ICD-10-CM | POA: Diagnosis not present

## 2017-09-09 DIAGNOSIS — Z7982 Long term (current) use of aspirin: Secondary | ICD-10-CM | POA: Diagnosis not present

## 2017-09-09 DIAGNOSIS — H02105 Unspecified ectropion of left lower eyelid: Secondary | ICD-10-CM | POA: Diagnosis not present

## 2017-09-09 DIAGNOSIS — I110 Hypertensive heart disease with heart failure: Secondary | ICD-10-CM | POA: Diagnosis not present

## 2017-09-09 DIAGNOSIS — I447 Left bundle-branch block, unspecified: Secondary | ICD-10-CM | POA: Diagnosis not present

## 2017-09-09 DIAGNOSIS — H5789 Other specified disorders of eye and adnexa: Secondary | ICD-10-CM | POA: Diagnosis not present

## 2017-09-09 DIAGNOSIS — I2510 Atherosclerotic heart disease of native coronary artery without angina pectoris: Secondary | ICD-10-CM | POA: Diagnosis not present

## 2017-09-09 DIAGNOSIS — H02102 Unspecified ectropion of right lower eyelid: Secondary | ICD-10-CM | POA: Diagnosis not present

## 2017-09-09 DIAGNOSIS — E785 Hyperlipidemia, unspecified: Secondary | ICD-10-CM | POA: Diagnosis not present

## 2017-09-09 DIAGNOSIS — E039 Hypothyroidism, unspecified: Secondary | ICD-10-CM | POA: Diagnosis not present

## 2017-09-09 DIAGNOSIS — I5030 Unspecified diastolic (congestive) heart failure: Secondary | ICD-10-CM | POA: Diagnosis not present

## 2017-09-09 DIAGNOSIS — H02403 Unspecified ptosis of bilateral eyelids: Secondary | ICD-10-CM | POA: Diagnosis not present

## 2017-09-09 NOTE — Telephone Encounter (Signed)
Daughter called wanting to know when mom can start taking the baby Asprin and blood thinner again.  She had eye surgery this am.  The call back is [phone]  Thanks teri

## 2017-09-13 NOTE — Telephone Encounter (Signed)
Please advise 

## 2017-09-14 NOTE — Telephone Encounter (Signed)
Daughter Harolyn Rutherford, RMA

## 2017-09-14 NOTE — Telephone Encounter (Signed)
Thursday

## 2017-09-29 ENCOUNTER — Ambulatory Visit (INDEPENDENT_AMBULATORY_CARE_PROVIDER_SITE_OTHER): Payer: Medicare Other | Admitting: Physician Assistant

## 2017-09-29 ENCOUNTER — Encounter: Payer: Self-pay | Admitting: Physician Assistant

## 2017-09-29 VITALS — BP 132/60 | HR 68 | Temp 98.2°F | Resp 16 | Wt 170.0 lb

## 2017-09-29 DIAGNOSIS — R05 Cough: Secondary | ICD-10-CM

## 2017-09-29 DIAGNOSIS — R059 Cough, unspecified: Secondary | ICD-10-CM

## 2017-09-29 MED ORDER — DOXYCYCLINE HYCLATE 100 MG PO TABS: 100.0000 mg | ORAL_TABLET | Freq: Two times a day (BID) | ORAL | 0 refills | Status: AC

## 2017-09-29 NOTE — Patient Instructions (Signed)

## 2017-09-29 NOTE — Progress Notes (Signed)
[place]  Chief Complaint  Patient presents with  . URI    Started about three weeks ago    Subjective:    Patient ID: Stephanie Charles, female    DOB: [birth year], 83 y.o.   MRN: [id number]  Upper Respiratory Infection: Stephanie Charles is a 83 y.o. female complaining of symptoms of a URI. Symptoms include congestion, cough and sore throat. Onset of symptoms was 3 weeks ago, gradually worsening since that time. She also c/o congestion, cough described as productive, sore throat and wheezing for the past 3 weeks .  She is drinking plenty of fluids. Evaluation to date: none. Treatment to date: none. The treatment has provided no relief.   Review of Systems  Constitutional: Positive for fatigue. Negative for activity change, appetite change, chills, diaphoresis, fever and unexpected weight change.  HENT: Positive for congestion, sneezing, sore throat, tinnitus and voice change. Negative for ear discharge, ear pain, nosebleeds, postnasal drip, rhinorrhea, sinus pressure, sinus pain and trouble swallowing.   Respiratory: Positive for cough, shortness of breath and wheezing. Negative for apnea, choking, chest tightness and stridor.   Gastrointestinal: Negative.   Neurological: Positive for headaches. Negative for dizziness and light-headedness.       Objective:   BP 132/60 (BP Location: Left Arm, Patient Position: Sitting, Cuff Size: Normal)   Pulse 68   Temp 98.2 F (36.8 C) (Oral)   Resp 16   Wt 170 lb (77.1 kg)   SpO2 96%   BMI 30.11 kg/m   Patient Active Problem List   Diagnosis Date Noted  . Ankle edema, bilateral 05/04/2017  . Elevated troponin I level 08/25/2015  . Abnormal blood sugar 03/11/2015  . Allergic rhinitis 03/11/2015  . Absolute anemia 03/11/2015  . Arthropathia 03/11/2015  . Benign essential HTN 03/11/2015  . Benign essential tremor 03/11/2015  . Artery disease, cerebral 03/11/2015  . Chronic venous insufficiency  03/11/2015  . Clinical depression 03/11/2015  . Urinary system disease 03/11/2015  . Acid reflux 03/11/2015  . HLD (hyperlipidemia) 03/11/2015  . Adult hypothyroidism 03/11/2015  . Irritable colon 03/11/2015  . Cannot sleep 03/11/2015  . Weak pulse 03/11/2015  . Carotid stenosis 03/11/2015  . Temporary cerebral vascular dysfunction 03/11/2015  . External carotid artery stenosis 07/12/2014    Outpatient Encounter Medications as of 09/29/2017  Medication Sig Note  . allopurinol (ZYLOPRIM) 300 MG tablet TAKE ONE TABLET BY MOUTH ONCE DAILY   . amitriptyline (ELAVIL) 10 MG tablet Take 10 mg by mouth at bedtime.   Marland Kitchen amLODipine (NORVASC) 2.5 MG tablet Take 1 tablet by mouth daily.   Marland Kitchen aspirin 81 MG tablet Take by mouth. 03/11/2015: Received from: [facility]  . atorvastatin (LIPITOR) 40 MG tablet TAKE ONE TABLET BY MOUTH ONCE DAILY   . carvedilol (COREG) 3.125 MG tablet Take 1 tablet (3.125 mg total) by mouth 2 (two) times daily.   . clopidogrel (PLAVIX) 75 MG tablet TAKE ONE TABLET BY MOUTH ONCE DAILY   . fluticasone (FLONASE) 50 MCG/ACT nasal spray Place 2 sprays into both nostrils daily.   . hydrALAZINE (APRESOLINE) 25 MG tablet Take 1 tablet (25 mg total) by mouth 3 (three) times daily.   . hydrochlorothiazide (HYDRODIURIL) 25 MG tablet TAKE ONE TABLET BY MOUTH ONCE DAILY   . isosorbide mononitrate (IMDUR) 30 MG 24 hr tablet Take 1 tablet (30 mg total) by mouth every morning.   Marland Kitchen levothyroxine (SYNTHROID, LEVOTHROID) 137 MCG tablet TAKE ONE TABLET BY MOUTH ONCE  DAILY   . losartan (COZAAR) 100 MG tablet TAKE ONE TABLET BY MOUTH ONCE DAILY   . meclizine (ANTIVERT) 25 MG tablet TAKE ONE TABLET BY MOUTH THREE TIMES DAILY AS NEEDED FOR DIZZINESS   . Melatonin 3 MG TBDP Take 1 tablet by mouth at bedtime.   . mirtazapine (REMERON) 15 MG tablet Take 1 tablet (15 mg total) by mouth at bedtime.   . Misc Natural Products (GLUCOSAMINE-CHONDROITIN PLUS PO) Take 1 tablet by mouth  daily.   . mometasone (ELOCON) 0.1 % cream Apply 1 application topically daily.   . ranitidine (ZANTAC) 150 MG capsule Take 1 capsule (150 mg total) by mouth 2 (two) times daily.   . sertraline (ZOLOFT) 25 MG tablet Take 1 tablet (25 mg total) by mouth daily.    No facility-administered encounter medications on file as of 09/29/2017.     Allergies  Allergen Reactions  . Codeine   . Levofloxacin     Mouth sores  . Simvastatin     Myalgias       Physical Exam  Constitutional: She appears well-developed and well-nourished.  HENT:  Right Ear: External ear normal.  Left Ear: External ear normal.  Mouth/Throat: Oropharynx is clear and moist. No oropharyngeal exudate.  Eyes: Right eye exhibits discharge. Left eye exhibits discharge.  Neck: Neck supple.  Cardiovascular: Normal rate and regular rhythm.  Pulmonary/Chest: Effort normal and breath sounds normal. No respiratory distress. She has no rales.  Lymphadenopathy:    She has no cervical adenopathy.  Skin: Skin is warm and dry.  Psychiatric: She has a normal mood and affect. Her behavior is normal.       Assessment & Plan:   Problem List Items Addressed This Visit    None    Visit Diagnoses    Cough    -  Primary   Relevant Medications   doxycycline (VIBRA-TABS) 100 MG tablet     Return if symptoms worsen or fail to improve.   The entirety of the information documented in the History of Present Illness, Review of Systems and Physical Exam were personally obtained by me. Portions of this information were initially documented by Ashley Royalty, CMA and reviewed by me for thoroughness and accuracy.

## 2017-10-11 ENCOUNTER — Ambulatory Visit: Payer: Self-pay | Admitting: Family Medicine

## 2017-11-10 ENCOUNTER — Ambulatory Visit: Payer: Self-pay

## 2017-11-10 ENCOUNTER — Encounter: Payer: Medicare Other | Admitting: Family Medicine

## 2017-11-10 ENCOUNTER — Other Ambulatory Visit: Payer: Self-pay | Admitting: Family Medicine

## 2017-11-12 DIAGNOSIS — R0902 Hypoxemia: Secondary | ICD-10-CM | POA: Diagnosis not present

## 2017-11-12 DIAGNOSIS — J40 Bronchitis, not specified as acute or chronic: Secondary | ICD-10-CM | POA: Diagnosis not present

## 2017-11-12 DIAGNOSIS — R0602 Shortness of breath: Secondary | ICD-10-CM | POA: Diagnosis not present

## 2017-11-12 DIAGNOSIS — R0600 Dyspnea, unspecified: Secondary | ICD-10-CM | POA: Diagnosis not present

## 2017-11-12 DIAGNOSIS — Z8673 Personal history of transient ischemic attack (TIA), and cerebral infarction without residual deficits: Secondary | ICD-10-CM | POA: Diagnosis not present

## 2017-11-12 DIAGNOSIS — E039 Hypothyroidism, unspecified: Secondary | ICD-10-CM | POA: Diagnosis not present

## 2017-11-12 DIAGNOSIS — R7989 Other specified abnormal findings of blood chemistry: Secondary | ICD-10-CM | POA: Diagnosis not present

## 2017-11-12 DIAGNOSIS — I10 Essential (primary) hypertension: Secondary | ICD-10-CM | POA: Diagnosis not present

## 2017-11-12 DIAGNOSIS — Z7982 Long term (current) use of aspirin: Secondary | ICD-10-CM | POA: Diagnosis not present

## 2017-11-12 DIAGNOSIS — R05 Cough: Secondary | ICD-10-CM | POA: Diagnosis not present

## 2017-11-12 DIAGNOSIS — R740 Nonspecific elevation of levels of transaminase and lactic acid dehydrogenase [LDH]: Secondary | ICD-10-CM | POA: Diagnosis not present

## 2017-11-12 DIAGNOSIS — Z66 Do not resuscitate: Secondary | ICD-10-CM | POA: Diagnosis not present

## 2017-11-12 DIAGNOSIS — G4700 Insomnia, unspecified: Secondary | ICD-10-CM | POA: Diagnosis not present

## 2017-11-12 DIAGNOSIS — G8929 Other chronic pain: Secondary | ICD-10-CM | POA: Diagnosis not present

## 2017-11-12 DIAGNOSIS — M419 Scoliosis, unspecified: Secondary | ICD-10-CM | POA: Diagnosis not present

## 2017-11-12 DIAGNOSIS — K219 Gastro-esophageal reflux disease without esophagitis: Secondary | ICD-10-CM | POA: Diagnosis not present

## 2017-11-13 MED ORDER — ALLOPURINOL 100 MG PO TABS
300.00 mg | ORAL_TABLET | ORAL | Status: DC
Start: 2017-11-14 — End: 2017-11-13

## 2017-11-13 MED ORDER — ACETAMINOPHEN 325 MG PO TABS
650.00 mg | ORAL_TABLET | ORAL | Status: DC
Start: ? — End: 2017-11-13

## 2017-11-13 MED ORDER — AMLODIPINE BESYLATE 5 MG PO TABS
5.00 mg | ORAL_TABLET | ORAL | Status: DC
Start: 2017-11-14 — End: 2017-11-13

## 2017-11-13 MED ORDER — AMITRIPTYLINE HCL 10 MG PO TABS
10.00 mg | ORAL_TABLET | ORAL | Status: DC
Start: 2017-11-13 — End: 2017-11-13

## 2017-11-13 MED ORDER — SERTRALINE HCL 25 MG PO TABS
25.00 mg | ORAL_TABLET | ORAL | Status: DC
Start: 2017-11-14 — End: 2017-11-13

## 2017-11-13 MED ORDER — LEVALBUTEROL HCL 1.25 MG/3ML IN NEBU
1.25 mg | INHALATION_SOLUTION | RESPIRATORY_TRACT | Status: DC
Start: 2017-11-13 — End: 2017-11-13

## 2017-11-13 MED ORDER — GUAIFENESIN 100 MG/5ML PO SYRP
400.00 mg | ORAL_SOLUTION | ORAL | Status: DC
Start: ? — End: 2017-11-13

## 2017-11-13 MED ORDER — CLOPIDOGREL BISULFATE 75 MG PO TABS
75.00 mg | ORAL_TABLET | ORAL | Status: DC
Start: 2017-11-13 — End: 2017-11-13

## 2017-11-13 MED ORDER — ERYTHROMYCIN 5 MG/GM OP OINT
TOPICAL_OINTMENT | OPHTHALMIC | Status: DC
Start: ? — End: 2017-11-13

## 2017-11-13 MED ORDER — HYDROCHLOROTHIAZIDE 25 MG PO TABS
25.00 mg | ORAL_TABLET | ORAL | Status: DC
Start: 2017-11-14 — End: 2017-11-13

## 2017-11-13 MED ORDER — GENERIC EXTERNAL MEDICATION
137.00 | Status: DC
Start: 2017-11-14 — End: 2017-11-13

## 2017-11-13 MED ORDER — ONDANSETRON 4 MG PO TBDP
4.00 mg | ORAL_TABLET | ORAL | Status: DC
Start: ? — End: 2017-11-13

## 2017-11-13 MED ORDER — ENOXAPARIN SODIUM 40 MG/0.4ML ~~LOC~~ SOLN
40.00 mg | SUBCUTANEOUS | Status: DC
Start: 2017-11-14 — End: 2017-11-13

## 2017-11-13 MED ORDER — OSELTAMIVIR PHOSPHATE 30 MG PO CAPS
30.00 mg | ORAL_CAPSULE | ORAL | Status: DC
Start: 2017-11-13 — End: 2017-11-13

## 2017-11-13 MED ORDER — MELATONIN 3 MG PO TABS
6.00 mg | ORAL_TABLET | ORAL | Status: DC
Start: ? — End: 2017-11-13

## 2017-11-13 MED ORDER — FLUTICASONE PROPIONATE 50 MCG/ACT NA SUSP
2.00 | NASAL | Status: DC
Start: 2017-11-14 — End: 2017-11-13

## 2017-11-13 MED ORDER — OXYMETAZOLINE HCL 0.05 % NA SOLN
3.00 | NASAL | Status: DC
Start: 2017-11-13 — End: 2017-11-13

## 2017-11-13 MED ORDER — FAMOTIDINE 20 MG PO TABS
20.00 mg | ORAL_TABLET | ORAL | Status: DC
Start: 2017-11-13 — End: 2017-11-13

## 2017-11-13 MED ORDER — CARVEDILOL 3.125 MG PO TABS
3.13 mg | ORAL_TABLET | ORAL | Status: DC
Start: 2017-11-13 — End: 2017-11-13

## 2017-11-13 MED ORDER — LOSARTAN POTASSIUM 25 MG PO TABS
100.00 mg | ORAL_TABLET | ORAL | Status: DC
Start: 2017-11-14 — End: 2017-11-13

## 2017-11-13 MED ORDER — ATORVASTATIN CALCIUM 20 MG PO TABS
40.00 mg | ORAL_TABLET | ORAL | Status: DC
Start: ? — End: 2017-11-13

## 2017-11-13 MED ORDER — LEVALBUTEROL HCL 1.25 MG/3ML IN NEBU
1.25 mg | INHALATION_SOLUTION | RESPIRATORY_TRACT | Status: DC
Start: ? — End: 2017-11-13

## 2017-11-13 MED ORDER — SENNOSIDES 8.6 MG PO TABS
2.00 | ORAL_TABLET | ORAL | Status: DC
Start: ? — End: 2017-11-13

## 2017-11-13 MED ORDER — ASPIRIN 81 MG PO CHEW
81.00 mg | CHEWABLE_TABLET | ORAL | Status: DC
Start: 2017-11-14 — End: 2017-11-13

## 2017-11-13 MED ORDER — OXYCODONE-ACETAMINOPHEN 5-325 MG PO TABS
1.00 | ORAL_TABLET | ORAL | Status: DC
Start: ? — End: 2017-11-13

## 2017-11-13 MED ORDER — POLYETHYLENE GLYCOL 3350 17 G PO PACK
17.00 | PACK | ORAL | Status: DC
Start: ? — End: 2017-11-13

## 2017-11-14 ENCOUNTER — Telehealth: Payer: Self-pay | Admitting: Family Medicine

## 2017-11-14 NOTE — Telephone Encounter (Signed)
Patient was D/C from [facility] on 11/13/2017 for breathing problems.  She has an appointment for HFU on 11/16/2017 at 4pm.

## 2017-11-16 ENCOUNTER — Ambulatory Visit (INDEPENDENT_AMBULATORY_CARE_PROVIDER_SITE_OTHER): Payer: Medicare Other | Admitting: Family Medicine

## 2017-11-16 ENCOUNTER — Encounter: Payer: Self-pay | Admitting: Family Medicine

## 2017-11-16 VITALS — BP 162/64 | HR 66 | Temp 97.5°F | Resp 18 | Wt 167.0 lb

## 2017-11-16 DIAGNOSIS — J45901 Unspecified asthma with (acute) exacerbation: Secondary | ICD-10-CM | POA: Diagnosis not present

## 2017-11-16 DIAGNOSIS — R748 Abnormal levels of other serum enzymes: Secondary | ICD-10-CM | POA: Diagnosis not present

## 2017-11-16 DIAGNOSIS — J111 Influenza due to unidentified influenza virus with other respiratory manifestations: Secondary | ICD-10-CM

## 2017-11-16 DIAGNOSIS — Z09 Encounter for follow-up examination after completed treatment for conditions other than malignant neoplasm: Secondary | ICD-10-CM | POA: Diagnosis not present

## 2017-11-16 NOTE — Progress Notes (Signed)
Patient: Stephanie Charles Female    DOB: 1930-03-24   84 y.o.   MRN: [id number] Visit Date: 11/16/2017  Today's Provider: [name], MD   Chief Complaint  Patient presents with  . Hospitalization Follow-up   Subjective:    HPI Pt is here for a hospital follow up. She was at [facility] on 11/12/17 for influenza, hypoxemia and wheezing. Pt was given Tamiflu, CXR showed no superimposed PNA elevated liver enzymes but thought to be due to influenza. Pt was told to stop her carvedilol, Hydralazine, HCTZ, and mirtazapine and would like to know why. She has not stopped them because Dr. Rosanna Randy did not tell her to do so. Pt reports that she also thinks she had some sort of GI bug during the time of the hospitalization. Pt is still feeling very weak and tired. She has no energy. She reports that her breathing is better, but is still very short of breath. Pt reports that she also has no appetite and has not eaten a good meal until yesterday.       Allergies  Allergen Reactions  . Codeine   . Levofloxacin     Mouth sores  . Simvastatin     Myalgias     Current Outpatient Medications:  .  albuterol (PROVENTIL) (2.5 MG/3ML) 0.083% nebulizer solution, 2.5 mg., Disp: , Rfl:  .  allopurinol (ZYLOPRIM) 300 MG tablet, TAKE ONE TABLET BY MOUTH ONCE DAILY, Disp: 90 tablet, Rfl: 3 .  amitriptyline (ELAVIL) 10 MG tablet, Take 10 mg by mouth at bedtime., Disp: , Rfl:  .  amLODipine (NORVASC) 2.5 MG tablet, Take 1 tablet by mouth daily., Disp: , Rfl:  .  aspirin 81 MG tablet, Take by mouth., Disp: , Rfl:  .  atorvastatin (LIPITOR) 40 MG tablet, TAKE ONE TABLET BY MOUTH ONCE DAILY, Disp: 90 tablet, Rfl: 3 .  carvedilol (COREG) 3.125 MG tablet, Take 1 tablet (3.125 mg total) by mouth 2 (two) times daily., Disp: 180 tablet, Rfl: 3 .  clopidogrel (PLAVIX) 75 MG tablet, TAKE ONE TABLET BY MOUTH ONCE DAILY, Disp: 90 tablet, Rfl: 3 .  hydrALAZINE (APRESOLINE) 25 MG tablet, Take 1 tablet (25  mg total) by mouth 3 (three) times daily., Disp: 270 tablet, Rfl: 3 .  hydrochlorothiazide (HYDRODIURIL) 25 MG tablet, TAKE ONE TABLET BY MOUTH ONCE DAILY, Disp: 90 tablet, Rfl: 3 .  levothyroxine (SYNTHROID, LEVOTHROID) 137 MCG tablet, TAKE ONE TABLET BY MOUTH ONCE DAILY, Disp: 90 tablet, Rfl: 4 .  losartan (COZAAR) 100 MG tablet, TAKE ONE TABLET BY MOUTH ONCE DAILY, Disp: 90 tablet, Rfl: 3 .  Melatonin 3 MG TBDP, Take 1 tablet by mouth at bedtime., Disp: , Rfl:  .  mirtazapine (REMERON) 15 MG tablet, Take 1 tablet (15 mg total) by mouth at bedtime., Disp: 90 tablet, Rfl: 3 .  sertraline (ZOLOFT) 25 MG tablet, Take 1 tablet (25 mg total) by mouth daily., Disp: 90 tablet, Rfl: 3 .  fluticasone (FLONASE) 50 MCG/ACT nasal spray, Place 2 sprays into both nostrils daily. (Patient not taking: Reported on 11/16/2017), Disp: 16 g, Rfl: 11 .  isosorbide mononitrate (IMDUR) 30 MG 24 hr tablet, Take 1 tablet (30 mg total) by mouth every morning. (Patient not taking: Reported on 11/16/2017), Disp: 30 tablet, Rfl: 12 .  meclizine (ANTIVERT) 25 MG tablet, TAKE ONE TABLET BY MOUTH THREE TIMES DAILY AS NEEDED FOR DIZZINESS (Patient not taking: Reported on 11/16/2017), Disp: 30 tablet, Rfl: 12 .  Misc Natural Products (GLUCOSAMINE-CHONDROITIN PLUS PO), Take 1 tablet by mouth daily., Disp: , Rfl:  .  mometasone (ELOCON) 0.1 % cream, Apply 1 application topically daily. (Patient not taking: Reported on 11/16/2017), Disp: 45 g, Rfl: 0 .  ranitidine (ZANTAC) 150 MG capsule, Take 1 capsule (150 mg total) by mouth 2 (two) times daily. (Patient not taking: Reported on 11/16/2017), Disp: 60 capsule, Rfl: 12  Review of Systems  Constitutional: Positive for fatigue.  HENT: Positive for congestion, postnasal drip, rhinorrhea, sinus pressure and sinus pain.   Eyes: Negative.   Respiratory: Positive for cough, shortness of breath and wheezing.   Cardiovascular: Negative.   Gastrointestinal: Positive for abdominal pain and  diarrhea.  Endocrine: Negative.   Genitourinary: Negative.   Musculoskeletal: Negative.   Skin: Negative.   Allergic/Immunologic: Negative.   Neurological: Positive for weakness and light-headedness.  Hematological: Negative.   Psychiatric/Behavioral: Negative.     Social History   Tobacco Use  . Smoking status: Never Smoker  . Smokeless tobacco: Never Used  Substance Use Topics  . Alcohol use: No    Alcohol/week: 0.0 oz   Objective:   BP (!) 162/64 (BP Location: Left Arm, Patient Position: Sitting, Cuff Size: Normal)   Pulse 66   Temp (!) 97.5 F (36.4 C) (Oral)   Resp 18   Wt 167 lb (75.8 kg)   SpO2 97%   BMI 29.58 kg/m  Vitals:   11/16/17 1610  BP: (!) 162/64  Pulse: 66  Resp: 18  Temp: (!) 97.5 F (36.4 C)  TempSrc: Oral  SpO2: 97%  Weight: 167 lb (75.8 kg)     Physical Exam  Constitutional: She is oriented to person, place, and time. She appears well-developed and well-nourished.  HENT:  Head: Normocephalic and atraumatic.  Right Ear: External ear normal.  Left Ear: External ear normal.  Nose: Nose normal.  Eyes: Conjunctivae are normal. No scleral icterus.  Neck: No thyromegaly present.  Cardiovascular: Normal rate, regular rhythm and normal heart sounds.  Pulmonary/Chest: Effort normal and breath sounds normal.  Abdominal: Soft.  Neurological: She is alert and oriented to person, place, and time.  Skin: Skin is warm and dry.  Psychiatric: She has a normal mood and affect. Her behavior is normal. Thought content normal.        Assessment & Plan:     1. Hospital discharge follow-up   2. Influenza Pt clinically better. Will slowly get strength back.  3. Asthmatic bronchitis with acute exacerbation, unspecified asthma severity, unspecified whether persistent  - CBC with Differential/Platelet  4. Elevated liver enzymes Due to influenza. - Comprehensive metabolic panel  I have done the exam and reviewed the chart and it is accurate to the  best of my knowledge. Development worker, community has been used and  any errors in dictation or transcription are unintentional. Miguel Aschoff M.D. [place], MD  [facility]

## 2017-11-16 NOTE — Patient Instructions (Signed)
Stop Hydrochlorothiazide and start probiotic daily

## 2017-11-17 LAB — COMPREHENSIVE METABOLIC PANEL
ALBUMIN: 4.4 g/dL (ref 3.5–4.7)
ALK PHOS: 147 IU/L — AB (ref 39–117)
ALT: 29 IU/L (ref 0–32)
AST: 22 IU/L (ref 0–40)
Albumin/Globulin Ratio: 1.8 (ref 1.2–2.2)
BUN / CREAT RATIO: 12 (ref 12–28)
BUN: 14 mg/dL (ref 8–27)
Bilirubin Total: 0.4 mg/dL (ref 0.0–1.2)
CALCIUM: 9.1 mg/dL (ref 8.7–10.3)
CHLORIDE: 96 mmol/L (ref 96–106)
CO2: 27 mmol/L (ref 20–29)
CREATININE: 1.14 mg/dL — AB (ref 0.57–1.00)
GFR calc non Af Amer: 44 mL/min/{1.73_m2} — ABNORMAL LOW (ref >59)
GFR, EST AFRICAN AMERICAN: 51 mL/min/{1.73_m2} — AB (ref >59)
GLUCOSE: 101 mg/dL — AB (ref 65–99)
Globulin, Total: 2.5 g/dL (ref 1.5–4.5)
POTASSIUM: 3.6 mmol/L (ref 3.5–5.2)
Sodium: 139 mmol/L (ref 134–144)
TOTAL PROTEIN: 6.9 g/dL (ref 6.0–8.5)

## 2017-11-17 LAB — CBC WITH DIFFERENTIAL/PLATELET
BASOS ABS: 0 10*3/uL (ref 0.0–0.2)
Basos: 0 %
EOS (ABSOLUTE): 0.2 10*3/uL (ref 0.0–0.4)
Eos: 3 %
HEMOGLOBIN: 12.4 g/dL (ref 11.1–15.9)
Hematocrit: 36.5 % (ref 34.0–46.6)
IMMATURE GRANS (ABS): 0 10*3/uL (ref 0.0–0.1)
Immature Granulocytes: 0 %
LYMPHS: 35 %
Lymphocytes Absolute: 2.5 10*3/uL (ref 0.7–3.1)
MCH: 29.5 pg (ref 26.6–33.0)
MCHC: 34 g/dL (ref 31.5–35.7)
MCV: 87 fL (ref 79–97)
MONOCYTES: 8 %
Monocytes Absolute: 0.6 10*3/uL (ref 0.1–0.9)
Neutrophils Absolute: 3.9 10*3/uL (ref 1.4–7.0)
Neutrophils: 54 %
Platelets: 212 10*3/uL (ref 150–379)
RBC: 4.21 x10E6/uL (ref 3.77–5.28)
RDW: 15.5 % — ABNORMAL HIGH (ref 12.3–15.4)
WBC: 7.2 10*3/uL (ref 3.4–10.8)

## 2017-12-14 ENCOUNTER — Ambulatory Visit (INDEPENDENT_AMBULATORY_CARE_PROVIDER_SITE_OTHER): Payer: Medicare Other | Admitting: Family Medicine

## 2017-12-14 ENCOUNTER — Telehealth: Payer: Self-pay | Admitting: Family Medicine

## 2017-12-14 VITALS — BP 170/64 | HR 60 | Temp 98.1°F | Resp 16

## 2017-12-14 DIAGNOSIS — N189 Chronic kidney disease, unspecified: Secondary | ICD-10-CM

## 2017-12-14 DIAGNOSIS — I10 Essential (primary) hypertension: Secondary | ICD-10-CM

## 2017-12-14 DIAGNOSIS — K759 Inflammatory liver disease, unspecified: Secondary | ICD-10-CM | POA: Diagnosis not present

## 2017-12-14 MED ORDER — AMLODIPINE BESYLATE 5 MG PO TABS: 5.0000 mg | ORAL_TABLET | Freq: Every day | ORAL | 12 refills | Status: DC

## 2017-12-14 NOTE — Telephone Encounter (Signed)
No answer

## 2017-12-14 NOTE — Telephone Encounter (Signed)
Patient was put on Isosorbide in 2017 by Dr Ubaldo Glassing.  She does not remember taking ti and she went through all of her medicine she has at home now and does not have any.  She does not know how log she has been off of it.   Does she need to be put back on this

## 2017-12-14 NOTE — Telephone Encounter (Signed)
Patient said she had 2 medications on her AVS that she doesn't take.  She Ii concerned about this.    Isosorbide Mononitrate 30 mg. and Glucosamine Chondroiton are the 2 meds.  Would like for [facility] to call her back

## 2017-12-14 NOTE — Progress Notes (Signed)
Stephanie Charles  MRN: [id number] DOB: 1929-12-23  Subjective:  HPI   The patient is an 84 year old female who presents for a 1 month follow up after she was seen for hospital follow up due to influenza and asthmatic bronchitis.  While in the hospital the patient had elevated liver enzymes.   The patient is concerned about her blood pressure.  She states she checked it a few days ago and it was 228/91.  She states she felt a little trembly but was not dizzy.  She took 2 Aspirin and went to bed.  She said a few hours later she checked it and it was 180/72. The patient reports she is under a lot of stress and has had 4 people close to her die recently, one of which was her brother and one of her sisters had a heart attack.  The patient was especially close to her.   Patient Active Problem List   Diagnosis Date Noted  . Ankle edema, bilateral 05/04/2017  . Elevated troponin I level 08/25/2015  . Abnormal blood sugar 03/11/2015  . Allergic rhinitis 03/11/2015  . Absolute anemia 03/11/2015  . Arthropathia 03/11/2015  . Benign essential HTN 03/11/2015  . Benign essential tremor 03/11/2015  . Artery disease, cerebral 03/11/2015  . Chronic venous insufficiency 03/11/2015  . Clinical depression 03/11/2015  . Urinary system disease 03/11/2015  . Acid reflux 03/11/2015  . HLD (hyperlipidemia) 03/11/2015  . Adult hypothyroidism 03/11/2015  . Irritable colon 03/11/2015  . Cannot sleep 03/11/2015  . Weak pulse 03/11/2015  . Carotid stenosis 03/11/2015  . Temporary cerebral vascular dysfunction 03/11/2015  . External carotid artery stenosis 07/12/2014    No past medical history on file.  Social History   Socioeconomic History  . Marital status: Divorced    Spouse name: na  . Number of children: 4  . Years of education: 36  . Highest education level: Not on file  Social Needs  . Financial resource strain: Not on file  . Food insecurity - worry: Not on file  . Food insecurity -  inability: Not on file  . Transportation needs - medical: Not on file  . Transportation needs - non-medical: Not on file  Occupational History  . Occupation: retired  Tobacco Use  . Smoking status: Never Smoker  . Smokeless tobacco: Never Used  Substance and Sexual Activity  . Alcohol use: No    Alcohol/week: 0.0 oz  . Drug use: No  . Sexual activity: No  Other Topics Concern  . Not on file  Social History Narrative  . Not on file    Outpatient Encounter Medications as of 12/14/2017  Medication Sig Note  . albuterol (PROVENTIL) (2.5 MG/3ML) 0.083% nebulizer solution 2.5 mg.   . allopurinol (ZYLOPRIM) 300 MG tablet TAKE ONE TABLET BY MOUTH ONCE DAILY   . amitriptyline (ELAVIL) 10 MG tablet Take 10 mg by mouth at bedtime.   Marland Kitchen amLODipine (NORVASC) 2.5 MG tablet Take 1 tablet by mouth daily.   Marland Kitchen aspirin 81 MG tablet Take by mouth. 03/11/2015: Received from: [facility]  . atorvastatin (LIPITOR) 40 MG tablet TAKE ONE TABLET BY MOUTH ONCE DAILY   . carvedilol (COREG) 3.125 MG tablet Take 1 tablet (3.125 mg total) by mouth 2 (two) times daily.   . clopidogrel (PLAVIX) 75 MG tablet TAKE ONE TABLET BY MOUTH ONCE DAILY   . fluticasone (FLONASE) 50 MCG/ACT nasal spray Place 2 sprays into both nostrils daily.   . hydrALAZINE (  APRESOLINE) 25 MG tablet Take 1 tablet (25 mg total) by mouth 3 (three) times daily.   . hydrochlorothiazide (HYDRODIURIL) 25 MG tablet TAKE ONE TABLET BY MOUTH ONCE DAILY   . isosorbide mononitrate (IMDUR) 30 MG 24 hr tablet Take 1 tablet (30 mg total) by mouth every morning.   Marland Kitchen levothyroxine (SYNTHROID, LEVOTHROID) 137 MCG tablet TAKE ONE TABLET BY MOUTH ONCE DAILY   . losartan (COZAAR) 100 MG tablet TAKE ONE TABLET BY MOUTH ONCE DAILY   . meclizine (ANTIVERT) 25 MG tablet TAKE ONE TABLET BY MOUTH THREE TIMES DAILY AS NEEDED FOR DIZZINESS   . Melatonin 3 MG TBDP Take 1 tablet by mouth at bedtime.   . mirtazapine (REMERON) 15 MG tablet Take 1 tablet (15  mg total) by mouth at bedtime.   . Misc Natural Products (GLUCOSAMINE-CHONDROITIN PLUS PO) Take 1 tablet by mouth daily.   . mometasone (ELOCON) 0.1 % cream Apply 1 application topically daily.   . ranitidine (ZANTAC) 150 MG capsule Take 1 capsule (150 mg total) by mouth 2 (two) times daily.   . sertraline (ZOLOFT) 25 MG tablet Take 1 tablet (25 mg total) by mouth daily.    No facility-administered encounter medications on file as of 12/14/2017.     Allergies  Allergen Reactions  . Codeine   . Levofloxacin     Mouth sores  . Simvastatin     Myalgias    Review of Systems  Constitutional: Positive for malaise/fatigue.  Eyes: Negative.   Respiratory: Positive for shortness of breath and wheezing (uses the nebulizer). Negative for cough (very minimal).   Cardiovascular: Positive for leg swelling (feet). Negative for chest pain, palpitations, orthopnea and claudication.  Gastrointestinal: Negative.   Skin: Negative.   Neurological: Positive for weakness.  Endo/Heme/Allergies: Negative.   Psychiatric/Behavioral: Negative.     Objective:  BP (!) 170/64 (BP Location: Right Arm, Patient Position: Sitting, Cuff Size: Normal)   Pulse 60   Temp 98.1 F (36.7 C) (Oral)   Resp 16   SpO2 95%   Physical Exam  Constitutional: She is oriented to person, place, and time and well-developed, well-nourished, and in no distress.  HENT:  Head: Normocephalic and atraumatic.  Eyes: Conjunctivae are normal. No scleral icterus.  Cardiovascular: Normal rate, regular rhythm and normal heart sounds.  Abdominal: Soft.  Neurological: She is alert and oriented to person, place, and time.  Skin: Skin is warm and dry.  Psychiatric: Mood, memory, affect and judgment normal.    Assessment and Plan :  1. Hepatitis/noninfectious  - Comprehensive metabolic panel  2. Benign essential HTN Double norvasc dose--pt advised of swelling side effect. - Comprehensive metabolic panel - amLODipine (NORVASC) 5 MG  tablet; Take 1 tablet (5 mg total) by mouth daily.  Dispense: 30 tablet; Refill: 12  3. Chronic kidney disease, unspecified CKD stage  - Comprehensive metabolic panel  I have done the exam and reviewed the chart and it is accurate to the best of my knowledge. Development worker, community has been used and  any errors in dictation or transcription are unintentional. Miguel Aschoff M.D. [facility]

## 2017-12-15 ENCOUNTER — Telehealth: Payer: Self-pay

## 2017-12-15 DIAGNOSIS — L821 Other seborrheic keratosis: Secondary | ICD-10-CM | POA: Diagnosis not present

## 2017-12-15 DIAGNOSIS — L853 Xerosis cutis: Secondary | ICD-10-CM | POA: Diagnosis not present

## 2017-12-15 LAB — COMPREHENSIVE METABOLIC PANEL
ALK PHOS: 133 IU/L — AB (ref 39–117)
ALT: 20 IU/L (ref 0–32)
AST: 21 IU/L (ref 0–40)
Albumin/Globulin Ratio: 1.7 (ref 1.2–2.2)
Albumin: 4 g/dL (ref 3.5–4.7)
BILIRUBIN TOTAL: 0.3 mg/dL (ref 0.0–1.2)
BUN/Creatinine Ratio: 13 (ref 12–28)
BUN: 13 mg/dL (ref 8–27)
CHLORIDE: 104 mmol/L (ref 96–106)
CO2: 24 mmol/L (ref 20–29)
Calcium: 9.3 mg/dL (ref 8.7–10.3)
Creatinine, Ser: 0.97 mg/dL (ref 0.57–1.00)
GFR calc Af Amer: 62 mL/min/{1.73_m2} (ref >59)
GFR calc non Af Amer: 53 mL/min/{1.73_m2} — ABNORMAL LOW (ref >59)
GLUCOSE: 85 mg/dL (ref 65–99)
Globulin, Total: 2.3 g/dL (ref 1.5–4.5)
Potassium: 4.2 mmol/L (ref 3.5–5.2)
Sodium: 143 mmol/L (ref 134–144)
TOTAL PROTEIN: 6.3 g/dL (ref 6.0–8.5)

## 2017-12-15 NOTE — Telephone Encounter (Signed)
Ok to stop both if she has not been taking.

## 2017-12-15 NOTE — Telephone Encounter (Signed)
-----   Message from Jerrol Banana., MD sent at 12/15/2017  2:09 PM EDT ----- Better.

## 2017-12-15 NOTE — Telephone Encounter (Signed)
Pt advised, and agrees with treatment plan.

## 2017-12-15 NOTE — Telephone Encounter (Signed)
LMTCB 12/15/2017  Thanks,   -Mickel Baas

## 2017-12-19 NOTE — Telephone Encounter (Signed)
Advised patient of results.  

## 2017-12-23 ENCOUNTER — Other Ambulatory Visit: Payer: Self-pay | Admitting: Family Medicine

## 2017-12-23 DIAGNOSIS — F3289 Other specified depressive episodes: Secondary | ICD-10-CM

## 2017-12-23 DIAGNOSIS — I10 Essential (primary) hypertension: Secondary | ICD-10-CM

## 2018-01-05 NOTE — Telephone Encounter (Signed)
complete

## 2018-01-11 ENCOUNTER — Other Ambulatory Visit: Payer: Self-pay | Admitting: Family Medicine

## 2018-01-11 DIAGNOSIS — G4700 Insomnia, unspecified: Secondary | ICD-10-CM

## 2018-01-11 DIAGNOSIS — H2513 Age-related nuclear cataract, bilateral: Secondary | ICD-10-CM | POA: Diagnosis not present

## 2018-01-12 ENCOUNTER — Ambulatory Visit (INDEPENDENT_AMBULATORY_CARE_PROVIDER_SITE_OTHER): Payer: Medicare Other | Admitting: Family Medicine

## 2018-01-12 VITALS — BP 166/60 | HR 76 | Temp 97.6°F | Resp 16 | Wt 166.0 lb

## 2018-01-12 DIAGNOSIS — I10 Essential (primary) hypertension: Secondary | ICD-10-CM | POA: Diagnosis not present

## 2018-01-12 MED ORDER — CARVEDILOL 6.25 MG PO TABS: 3.1250 mg | ORAL_TABLET | Freq: Two times a day (BID) | ORAL | 3 refills | Status: DC

## 2018-01-12 MED ORDER — AMITRIPTYLINE HCL 10 MG PO TABS: 10.0000 mg | ORAL_TABLET | Freq: Every day | ORAL | 3 refills | Status: DC

## 2018-01-12 NOTE — Progress Notes (Signed)
Patient: Stephanie Charles Female    DOB: [birth year]   84 y.o.   MRN: [id number] Visit Date: 01/12/2018  Today's Provider: [name], MD   Chief Complaint  Patient presents with  . Hypertension   Subjective:    HPI  Hypertension, follow-up:  BP Readings from Last 3 Encounters:  01/12/18 (!) 166/60  12/14/17 (!) 170/64  11/16/17 (!) 162/64    She was last seen for hypertension 1 months ago.  BP at that visit was 170/64. Management since that visit includes increased amlodipine to 5 mg daily. She reports good compliance with treatment. She is not having side effects.  She is exercising. She is adherent to low salt diet.   Outside blood pressures are 170's/70's.  She is experiencing dyspnea.  Patient denies chest pain, chest pressure/discomfort, claudication, exertional chest pressure/discomfort, irregular heart beat, lower extremity edema, near-syncope, orthopnea, palpitations, paroxysmal nocturnal dyspnea, syncope and tachypnea.    Wt Readings from Last 3 Encounters:  01/12/18 166 lb (75.3 kg)  11/16/17 167 lb (75.8 kg)  09/29/17 170 lb (77.1 kg)   ------------------------------------------------------------------------    Pt reports that her feels like her heart beats hard at night. She was given a medication for this in the past but she was taken off of it for some reason she does not know why. Pt reports that she has been under a lot of stress with 6 deaths in her family in the last 2 months. She is also requesting a refill on her Amitriptyline.     Allergies  Allergen Reactions  . Codeine   . Levofloxacin     Mouth sores  . Simvastatin     Myalgias     Current Outpatient Medications:  .  albuterol (PROVENTIL) (2.5 MG/3ML) 0.083% nebulizer solution, 2.5 mg., Disp: , Rfl:  .  allopurinol (ZYLOPRIM) 300 MG tablet, TAKE ONE TABLET BY MOUTH ONCE DAILY, Disp: 90 tablet, Rfl: 3 .  amitriptyline (ELAVIL) 10 MG tablet, Take 10 mg by mouth at bedtime.,  Disp: , Rfl:  .  amLODipine (NORVASC) 5 MG tablet, Take 1 tablet (5 mg total) by mouth daily., Disp: 30 tablet, Rfl: 12 .  aspirin 81 MG tablet, Take by mouth., Disp: , Rfl:  .  atorvastatin (LIPITOR) 40 MG tablet, TAKE ONE TABLET BY MOUTH ONCE DAILY, Disp: 90 tablet, Rfl: 3 .  carvedilol (COREG) 3.125 MG tablet, Take 1 tablet (3.125 mg total) by mouth 2 (two) times daily., Disp: 180 tablet, Rfl: 3 .  clopidogrel (PLAVIX) 75 MG tablet, TAKE ONE TABLET BY MOUTH ONCE DAILY, Disp: 90 tablet, Rfl: 3 .  fluticasone (FLONASE) 50 MCG/ACT nasal spray, Place 2 sprays into both nostrils daily., Disp: 16 g, Rfl: 11 .  hydrALAZINE (APRESOLINE) 25 MG tablet, Take 1 tablet (25 mg total) by mouth 3 (three) times daily., Disp: 270 tablet, Rfl: 3 .  hydrochlorothiazide (HYDRODIURIL) 25 MG tablet, TAKE 1 TABLET BY MOUTH ONCE DAILY, Disp: 90 tablet, Rfl: 3 .  isosorbide mononitrate (IMDUR) 30 MG 24 hr tablet, Take 1 tablet (30 mg total) by mouth every morning., Disp: 30 tablet, Rfl: 12 .  levothyroxine (SYNTHROID, LEVOTHROID) 137 MCG tablet, TAKE ONE TABLET BY MOUTH ONCE DAILY, Disp: 90 tablet, Rfl: 4 .  losartan (COZAAR) 100 MG tablet, TAKE ONE TABLET BY MOUTH ONCE DAILY, Disp: 90 tablet, Rfl: 3 .  meclizine (ANTIVERT) 25 MG tablet, TAKE ONE TABLET BY MOUTH THREE TIMES DAILY AS NEEDED FOR DIZZINESS, Disp: 30 tablet,  Rfl: 12 .  Melatonin 3 MG TBDP, Take 1 tablet by mouth at bedtime., Disp: , Rfl:  .  mirtazapine (REMERON) 15 MG tablet, Take 1 tablet (15 mg total) by mouth at bedtime., Disp: 90 tablet, Rfl: 3 .  Misc Natural Products (GLUCOSAMINE-CHONDROITIN PLUS PO), Take 1 tablet by mouth daily., Disp: , Rfl:  .  mometasone (ELOCON) 0.1 % cream, Apply 1 application topically daily., Disp: 45 g, Rfl: 0 .  ranitidine (ZANTAC) 150 MG capsule, Take 1 capsule (150 mg total) by mouth 2 (two) times daily., Disp: 60 capsule, Rfl: 12 .  sertraline (ZOLOFT) 25 MG tablet, TAKE ONE TABLET BY MOUTH ONCE DAILY, Disp: 90 tablet,  Rfl: 3  Review of Systems  Constitutional: Positive for fatigue.  HENT: Negative.   Eyes: Negative.   Respiratory: Positive for shortness of breath.   Cardiovascular: Negative.   Gastrointestinal: Negative.   Endocrine: Negative.   Genitourinary: Negative.   Musculoskeletal: Negative.   Skin: Negative.   Allergic/Immunologic: Negative.   Neurological: Positive for dizziness.  Hematological: Negative.   Psychiatric/Behavioral: Negative.     Social History   Tobacco Use  . Smoking status: Never Smoker  . Smokeless tobacco: Never Used  Substance Use Topics  . Alcohol use: No    Alcohol/week: 0.0 oz   Objective:   BP (!) 166/60 (BP Location: Left Arm, Patient Position: Sitting, Cuff Size: Normal)   Pulse 76   Temp 97.6 F (36.4 C) (Oral)   Resp 16   Wt 166 lb (75.3 kg)   BMI 29.41 kg/m  Vitals:   01/12/18 1035  BP: (!) 166/60  Pulse: 76  Resp: 16  Temp: 97.6 F (36.4 C)  TempSrc: Oral  Weight: 166 lb (75.3 kg)     Physical Exam  Constitutional: She is oriented to person, place, and time. She appears well-developed and well-nourished.  HENT:  Head: Normocephalic and atraumatic.  Eyes: No scleral icterus.  Neck: No thyromegaly present.  Cardiovascular: Normal rate, regular rhythm and normal heart sounds.  Pulmonary/Chest: Effort normal and breath sounds normal.  Abdominal: Soft.  Lymphadenopathy:    She has no cervical adenopathy.  Neurological: She is alert and oriented to person, place, and time.  Skin: Skin is warm and dry.  Psychiatric: She has a normal mood and affect. Her behavior is normal. Judgment and thought content normal.        Assessment & Plan:     HTN Try to slowly get down BP Increase Coreg to 6.25 BID GAD     I have done the exam and reviewed the above chart and it is accurate to the best of my knowledge. Development worker, community has been used in this note in any air is in the dictation or transcription are unintentional.  [name], MD  [facility]

## 2018-01-18 ENCOUNTER — Telehealth: Payer: Self-pay | Admitting: Family Medicine

## 2018-01-18 ENCOUNTER — Other Ambulatory Visit: Payer: Self-pay

## 2018-01-18 MED ORDER — CARVEDILOL 6.25 MG PO TABS: 6.2500 mg | ORAL_TABLET | Freq: Two times a day (BID) | ORAL | 3 refills | Status: DC

## 2018-01-18 NOTE — Telephone Encounter (Signed)
Pt needs a nurse to call her back about her rx for Carvedilol.  She said she picked it up yesterday from the pharmacy and it is not written correctly.  Her call back is [phone]  Thank s Con Memos

## 2018-02-14 DIAGNOSIS — M79605 Pain in left leg: Secondary | ICD-10-CM | POA: Diagnosis not present

## 2018-02-14 DIAGNOSIS — R945 Abnormal results of liver function studies: Secondary | ICD-10-CM | POA: Diagnosis not present

## 2018-02-16 ENCOUNTER — Other Ambulatory Visit: Payer: Self-pay | Admitting: Family Medicine

## 2018-02-23 ENCOUNTER — Encounter: Payer: Self-pay | Admitting: Family Medicine

## 2018-02-23 ENCOUNTER — Ambulatory Visit (INDEPENDENT_AMBULATORY_CARE_PROVIDER_SITE_OTHER): Payer: Medicare Other | Admitting: Family Medicine

## 2018-02-23 VITALS — BP 142/58 | HR 58 | Temp 98.6°F | Resp 20 | Ht 63.0 in | Wt 168.0 lb

## 2018-02-23 DIAGNOSIS — E785 Hyperlipidemia, unspecified: Secondary | ICD-10-CM | POA: Diagnosis not present

## 2018-02-23 DIAGNOSIS — I10 Essential (primary) hypertension: Secondary | ICD-10-CM

## 2018-02-23 DIAGNOSIS — M25472 Effusion, left ankle: Secondary | ICD-10-CM | POA: Diagnosis not present

## 2018-02-23 DIAGNOSIS — M25471 Effusion, right ankle: Secondary | ICD-10-CM

## 2018-02-23 NOTE — Progress Notes (Signed)
Patient: Stephanie Charles Female    DOB: [birth year]   84 y.o.   MRN: [id number] Visit Date: 02/23/2018  Today's Provider: [name], MD   Chief Complaint  Patient presents with  . Hypertension   Subjective:    HPI Patient comes in today for follow up on HTN. She was seen in the office 6 weeks ago, and was advised to increase Coreg to 6.25mg  BID.   Patient reports that she was seen by [facility] last week and they made some medication changes. She reports that she is no longer taking amitriptyline and will stop mirtazapine next week (on 5/28). She reports that since she has stopped amitriptyline she is not able to sleep at night. She also mentions a loud heartbeat while lying down that keeps her up at night.      Allergies  Allergen Reactions  . Codeine   . Levofloxacin     Mouth sores  . Simvastatin     Myalgias     Current Outpatient Medications:  .  albuterol (PROVENTIL) (2.5 MG/3ML) 0.083% nebulizer solution, 2.5 mg., Disp: , Rfl:  .  allopurinol (ZYLOPRIM) 300 MG tablet, TAKE ONE TABLET BY MOUTH ONCE DAILY, Disp: 90 tablet, Rfl: 3 .  amLODipine (NORVASC) 5 MG tablet, Take 1 tablet (5 mg total) by mouth daily., Disp: 30 tablet, Rfl: 12 .  aspirin 81 MG tablet, Take by mouth., Disp: , Rfl:  .  atorvastatin (LIPITOR) 40 MG tablet, TAKE ONE TABLET BY MOUTH ONCE DAILY, Disp: 90 tablet, Rfl: 3 .  carvedilol (COREG) 6.25 MG tablet, Take 1 tablet (6.25 mg total) by mouth 2 (two) times daily., Disp: 180 tablet, Rfl: 3 .  clopidogrel (PLAVIX) 75 MG tablet, TAKE 1 TABLET BY MOUTH ONCE DAILY, Disp: 90 tablet, Rfl: 3 .  fluticasone (FLONASE) 50 MCG/ACT nasal spray, Place 2 sprays into both nostrils daily., Disp: 16 g, Rfl: 11 .  hydrALAZINE (APRESOLINE) 25 MG tablet, Take 1 tablet (25 mg total) by mouth 3 (three) times daily., Disp: 270 tablet, Rfl: 3 .  hydrochlorothiazide (HYDRODIURIL) 25 MG tablet, TAKE 1 TABLET BY MOUTH ONCE DAILY, Disp: 90 tablet, Rfl: 3 .   isosorbide mononitrate (IMDUR) 30 MG 24 hr tablet, Take 1 tablet (30 mg total) by mouth every morning., Disp: 30 tablet, Rfl: 12 .  levothyroxine (SYNTHROID, LEVOTHROID) 137 MCG tablet, TAKE ONE TABLET BY MOUTH ONCE DAILY, Disp: 90 tablet, Rfl: 4 .  losartan (COZAAR) 100 MG tablet, TAKE ONE TABLET BY MOUTH ONCE DAILY, Disp: 90 tablet, Rfl: 3 .  meclizine (ANTIVERT) 25 MG tablet, TAKE ONE TABLET BY MOUTH THREE TIMES DAILY AS NEEDED FOR DIZZINESS, Disp: 30 tablet, Rfl: 12 .  Melatonin 3 MG TBDP, Take 1 tablet by mouth at bedtime., Disp: , Rfl:  .  Misc Natural Products (GLUCOSAMINE-CHONDROITIN PLUS PO), Take 1 tablet by mouth daily., Disp: , Rfl:  .  mometasone (ELOCON) 0.1 % cream, Apply 1 application topically daily., Disp: 45 g, Rfl: 0 .  sertraline (ZOLOFT) 25 MG tablet, TAKE ONE TABLET BY MOUTH ONCE DAILY, Disp: 90 tablet, Rfl: 3 .  amitriptyline (ELAVIL) 10 MG tablet, Take 1 tablet (10 mg total) by mouth at bedtime. (Patient not taking: Reported on 02/23/2018), Disp: 90 tablet, Rfl: 3 .  mirtazapine (REMERON) 15 MG tablet, Take 1 tablet (15 mg total) by mouth at bedtime. (Patient not taking: Reported on 02/23/2018), Disp: 90 tablet, Rfl: 3 .  ranitidine (ZANTAC) 150 MG capsule, Take  1 capsule (150 mg total) by mouth 2 (two) times daily. (Patient not taking: Reported on 02/23/2018), Disp: 60 capsule, Rfl: 12  Review of Systems  Constitutional: Negative for activity change and fatigue.  HENT: Negative.   Eyes: Negative.   Respiratory: Negative for shortness of breath.   Cardiovascular: Negative for chest pain, palpitations and leg swelling.  Endocrine: Negative.   Musculoskeletal: Negative for back pain.  Allergic/Immunologic: Negative.   Neurological: Negative for dizziness and headaches.  Psychiatric/Behavioral: Positive for sleep disturbance.    Social History   Tobacco Use  . Smoking status: Never Smoker  . Smokeless tobacco: Never Used  Substance Use Topics  . Alcohol use: No     Alcohol/week: 0.0 oz   Objective:   BP (!) 142/58 (BP Location: Right Arm, Patient Position: Sitting, Cuff Size: Normal)   Pulse (!) 58   Temp 98.6 F (37 C)   Resp 20   Ht 5\' 3"  (1.6 m)   Wt 168 lb (76.2 kg)   SpO2 98%   BMI 29.76 kg/m  Vitals:   02/23/18 1322  BP: (!) 142/58  Pulse: (!) 58  Resp: 20  Temp: 98.6 F (37 C)  SpO2: 98%  Weight: 168 lb (76.2 kg)  Height: 5\' 3"  (1.6 m)     Physical Exam  Constitutional: She is oriented to person, place, and time. She appears well-developed and well-nourished.  HENT:  Head: Normocephalic and atraumatic.  Eyes: Conjunctivae are normal. No scleral icterus.  Neck: No thyromegaly present.  Cardiovascular: Normal rate, regular rhythm and normal heart sounds.  Pulmonary/Chest: Effort normal and breath sounds normal.  Abdominal: Soft.  Musculoskeletal:  Trace LE edema.  Neurological: She is alert and oriented to person, place, and time.  Skin: Skin is warm and dry.  Psychiatric: She has a normal mood and affect. Her behavior is normal. Judgment and thought content normal.        Assessment & Plan:     1. Benign essential HTN 2.HLD 3.MDD/GAD Stable.         I have done the exam and reviewed the above chart and it is accurate to the best of my knowledge. Development worker, community has been used in this note in any air is in the dictation or transcription are unintentional.  [name], MD  [place]

## 2018-02-24 DIAGNOSIS — M5416 Radiculopathy, lumbar region: Secondary | ICD-10-CM | POA: Diagnosis not present

## 2018-02-24 DIAGNOSIS — M545 Low back pain: Secondary | ICD-10-CM | POA: Diagnosis not present

## 2018-03-07 DIAGNOSIS — M5416 Radiculopathy, lumbar region: Secondary | ICD-10-CM | POA: Diagnosis not present

## 2018-03-07 DIAGNOSIS — M545 Low back pain: Secondary | ICD-10-CM | POA: Diagnosis not present

## 2018-03-16 ENCOUNTER — Ambulatory Visit (INDEPENDENT_AMBULATORY_CARE_PROVIDER_SITE_OTHER): Payer: Medicare Other | Admitting: Family Medicine

## 2018-03-16 ENCOUNTER — Ambulatory Visit (INDEPENDENT_AMBULATORY_CARE_PROVIDER_SITE_OTHER): Payer: Medicare Other

## 2018-03-16 ENCOUNTER — Telehealth: Payer: Self-pay

## 2018-03-16 VITALS — BP 128/48 | HR 52 | Temp 98.4°F | Ht 63.0 in | Wt 164.2 lb

## 2018-03-16 DIAGNOSIS — R079 Chest pain, unspecified: Secondary | ICD-10-CM | POA: Diagnosis not present

## 2018-03-16 DIAGNOSIS — Z0000 Encounter for general adult medical examination without abnormal findings: Secondary | ICD-10-CM

## 2018-03-16 DIAGNOSIS — R5383 Other fatigue: Secondary | ICD-10-CM

## 2018-03-16 DIAGNOSIS — G629 Polyneuropathy, unspecified: Secondary | ICD-10-CM

## 2018-03-16 DIAGNOSIS — R002 Palpitations: Secondary | ICD-10-CM | POA: Diagnosis not present

## 2018-03-16 NOTE — Patient Instructions (Addendum)
Stephanie Charles , Thank you for taking time to come for your Medicare Wellness Visit. I appreciate your ongoing commitment to your health goals. Please review the following plan we discussed and let me know if I can assist you in the future.   Screening recommendations/referrals: Colonoscopy: Up to date Mammogram: Up to date Bone Density: Up to date Recommended yearly ophthalmology/optometry visit for glaucoma screening and checkup Recommended yearly dental visit for hygiene and checkup  Vaccinations: Influenza vaccine: Up to date Pneumococcal vaccine: Up to date Tdap vaccine: Up to date Shingles vaccine: Pt declines today.     Advanced directives: Please bring a copy of your POA (Power of Attorney) and/or Living Will to your next appointment.   Conditions/risks identified: Recommend to start the senior class that will be once weekly for 1 hour to help with balance and strength training.   Next appointment: 05/30/18 with Dr Rosanna Randy. Pt declined scheduling AWV for 2020.   Preventive Care 47 Years and Older, Female Preventive care refers to lifestyle choices and visits with your health care provider that can promote health and wellness. What does preventive care include?  A yearly physical exam. This is also called an annual well check.  Dental exams once or twice a year.  Routine eye exams. Ask your health care provider how often you should have your eyes checked.  Personal lifestyle choices, including:  Daily care of your teeth and gums.  Regular physical activity.  Eating a healthy diet.  Avoiding tobacco and drug use.  Limiting alcohol use.  Practicing safe sex.  Taking low-dose aspirin every day.  Taking vitamin and mineral supplements as recommended by your health care provider. What happens during an annual well check? The services and screenings done by your health care provider during your annual well check will depend on your age, overall health, lifestyle risk  factors, and family history of disease. Counseling  Your health care provider may ask you questions about your:  Alcohol use.  Tobacco use.  Drug use.  Emotional well-being.  Home and relationship well-being.  Sexual activity.  Eating habits.  History of falls.  Memory and ability to understand (cognition).  Work and work Statistician.  Reproductive health. Screening  You may have the following tests or measurements:  Height, weight, and BMI.  Blood pressure.  Lipid and cholesterol levels. These may be checked every 5 years, or more frequently if you are over 60 years old.  Skin check.  Lung cancer screening. You may have this screening every year starting at age 2 if you have a 30-pack-year history of smoking and currently smoke or have quit within the past 15 years.  Fecal occult blood test (FOBT) of the stool. You may have this test every year starting at age 15.  Flexible sigmoidoscopy or colonoscopy. You may have a sigmoidoscopy every 5 years or a colonoscopy every 10 years starting at age 6.  Hepatitis C blood test.  Hepatitis B blood test.  Sexually transmitted disease (STD) testing.  Diabetes screening. This is done by checking your blood sugar (glucose) after you have not eaten for a while (fasting). You may have this done every 1-3 years.  Bone density scan. This is done to screen for osteoporosis. You may have this done starting at age 31.  Mammogram. This may be done every 1-2 years. Talk to your health care provider about how often you should have regular mammograms. Talk with your health care provider about your test results, treatment options, and  if necessary, the need for more tests. Vaccines  Your health care provider may recommend certain vaccines, such as:  Influenza vaccine. This is recommended every year.  Tetanus, diphtheria, and acellular pertussis (Tdap, Td) vaccine. You may need a Td booster every 10 years.  Zoster vaccine. You  may need this after age 84.  Pneumococcal 13-valent conjugate (PCV13) vaccine. One dose is recommended after age 74.  Pneumococcal polysaccharide (PPSV23) vaccine. One dose is recommended after age 47. Talk to your health care provider about which screenings and vaccines you need and how often you need them. This information is not intended to replace advice given to you by your health care provider. Make sure you discuss any questions you have with your health care provider. Document Released: 10/17/2015 Document Revised: 06/09/2016 Document Reviewed: 07/22/2015 Elsevier Interactive Patient Education  2017 [place] Prevention in the Home Falls can cause injuries. They can happen to people of all ages. There are many things you can do to make your home safe and to help prevent falls. What can I do on the outside of my home?  Regularly fix the edges of walkways and driveways and fix any cracks.  Remove anything that might make you trip as you walk through a door, such as a raised step or threshold.  Trim any bushes or trees on the path to your home.  Use bright outdoor lighting.  Clear any walking paths of anything that might make someone trip, such as rocks or tools.  Regularly check to see if handrails are loose or broken. Make sure that both sides of any steps have handrails.  Any raised decks and porches should have guardrails on the edges.  Have any leaves, snow, or ice cleared regularly.  Use sand or salt on walking paths during winter.  Clean up any spills in your garage right away. This includes oil or grease spills. What can I do in the bathroom?  Use night lights.  Install grab bars by the toilet and in the tub and shower. Do not use towel bars as grab bars.  Use non-skid mats or decals in the tub or shower.  If you need to sit down in the shower, use a plastic, non-slip stool.  Keep the floor dry. Clean up any water that spills on the floor as soon as  it happens.  Remove soap buildup in the tub or shower regularly.  Attach bath mats securely with double-sided non-slip rug tape.  Do not have throw rugs and other things on the floor that can make you trip. What can I do in the bedroom?  Use night lights.  Make sure that you have a light by your bed that is easy to reach.  Do not use any sheets or blankets that are too big for your bed. They should not hang down onto the floor.  Have a firm chair that has side arms. You can use this for support while you get dressed.  Do not have throw rugs and other things on the floor that can make you trip. What can I do in the kitchen?  Clean up any spills right away.  Avoid walking on wet floors.  Keep items that you use a lot in easy-to-reach places.  If you need to reach something above you, use a strong step stool that has a grab bar.  Keep electrical cords out of the way.  Do not use floor polish or wax that makes floors slippery. If you  must use wax, use non-skid floor wax.  Do not have throw rugs and other things on the floor that can make you trip. What can I do with my stairs?  Do not leave any items on the stairs.  Make sure that there are handrails on both sides of the stairs and use them. Fix handrails that are broken or loose. Make sure that handrails are as long as the stairways.  Check any carpeting to make sure that it is firmly attached to the stairs. Fix any carpet that is loose or worn.  Avoid having throw rugs at the top or bottom of the stairs. If you do have throw rugs, attach them to the floor with carpet tape.  Make sure that you have a light switch at the top of the stairs and the bottom of the stairs. If you do not have them, ask someone to add them for you. What else can I do to help prevent falls?  Wear shoes that:  Do not have high heels.  Have rubber bottoms.  Are comfortable and fit you well.  Are closed at the toe. Do not wear sandals.  If  you use a stepladder:  Make sure that it is fully opened. Do not climb a closed stepladder.  Make sure that both sides of the stepladder are locked into place.  Ask someone to hold it for you, if possible.  Clearly mark and make sure that you can see:  Any grab bars or handrails.  First and last steps.  Where the edge of each step is.  Use tools that help you move around (mobility aids) if they are needed. These include:  Canes.  Walkers.  Scooters.  Crutches.  Turn on the lights when you go into a dark area. Replace any light bulbs as soon as they burn out.  Set up your furniture so you have a clear path. Avoid moving your furniture around.  If any of your floors are uneven, fix them.  If there are any pets around you, be aware of where they are.  Review your medicines with your doctor. Some medicines can make you feel dizzy. This can increase your chance of falling. Ask your doctor what other things that you can do to help prevent falls. This information is not intended to replace advice given to you by your health care provider. Make sure you discuss any questions you have with your health care provider. Document Released: 07/17/2009 Document Revised: 02/26/2016 Document Reviewed: 10/25/2014 Elsevier Interactive Patient Education  2017 [employer].

## 2018-03-16 NOTE — Progress Notes (Signed)
Patient: Stephanie Charles Female    DOB: 07-09-1930   84 y.o.   MRN: [id number] Visit Date: 03/16/2018  Today's Provider: [name], MD   Chief Complaint  Patient presents with  . Palpitations   Subjective:    HPI Patient comes in today c/o chest pain that comes goes for the last 3 days. She reports that she felt her heart beating so hard that she could hear it loud in her ears. She denies any shortness of breath, nausea, or numbness or tingling in her extremities.     Allergies  Allergen Reactions  . Levofloxacin     Mouth sores  . Lisinopril     Cough?  . Povidone-Iodine Other (See Comments)    Severe blistering and itchiness. Severe redness  . Simvastatin     Myalgias  . Codeine Nausea And Vomiting     Current Outpatient Medications:  .  albuterol (PROVENTIL) (2.5 MG/3ML) 0.083% nebulizer solution, Take 2.5 mg by nebulization every 4 (four) hours as needed. , Disp: , Rfl:  .  allopurinol (ZYLOPRIM) 300 MG tablet, TAKE ONE TABLET BY MOUTH ONCE DAILY, Disp: 90 tablet, Rfl: 3 .  amitriptyline (ELAVIL) 10 MG tablet, Take 1 tablet (10 mg total) by mouth at bedtime. (Patient not taking: Reported on 02/23/2018), Disp: 90 tablet, Rfl: 3 .  amLODipine (NORVASC) 5 MG tablet, Take 1 tablet (5 mg total) by mouth daily., Disp: 30 tablet, Rfl: 12 .  aspirin 81 MG tablet, Take 81 mg by mouth daily. , Disp: , Rfl:  .  atorvastatin (LIPITOR) 40 MG tablet, TAKE ONE TABLET BY MOUTH ONCE DAILY, Disp: 90 tablet, Rfl: 3 .  carvedilol (COREG) 6.25 MG tablet, Take 1 tablet (6.25 mg total) by mouth 2 (two) times daily., Disp: 180 tablet, Rfl: 3 .  clopidogrel (PLAVIX) 75 MG tablet, TAKE 1 TABLET BY MOUTH ONCE DAILY, Disp: 90 tablet, Rfl: 3 .  fluticasone (FLONASE) 50 MCG/ACT nasal spray, Place 2 sprays into both nostrils daily., Disp: 16 g, Rfl: 11 .  hydrALAZINE (APRESOLINE) 25 MG tablet, Take 1 tablet (25 mg total) by mouth 3 (three) times daily., Disp: 270 tablet, Rfl: 3 .   hydrochlorothiazide (HYDRODIURIL) 25 MG tablet, TAKE 1 TABLET BY MOUTH ONCE DAILY, Disp: 90 tablet, Rfl: 3 .  isosorbide mononitrate (IMDUR) 30 MG 24 hr tablet, Take 1 tablet (30 mg total) by mouth every morning. (Patient not taking: Reported on 03/16/2018), Disp: 30 tablet, Rfl: 12 .  levothyroxine (SYNTHROID, LEVOTHROID) 137 MCG tablet, TAKE ONE TABLET BY MOUTH ONCE DAILY, Disp: 90 tablet, Rfl: 4 .  losartan (COZAAR) 100 MG tablet, TAKE ONE TABLET BY MOUTH ONCE DAILY, Disp: 90 tablet, Rfl: 3 .  meclizine (ANTIVERT) 25 MG tablet, TAKE ONE TABLET BY MOUTH THREE TIMES DAILY AS NEEDED FOR DIZZINESS (Patient not taking: Reported on 03/16/2018), Disp: 30 tablet, Rfl: 12 .  Melatonin 3 MG TBDP, Take 1 tablet by mouth at bedtime., Disp: , Rfl:  .  mirtazapine (REMERON) 15 MG tablet, Take 1 tablet (15 mg total) by mouth at bedtime. (Patient not taking: Reported on 02/23/2018), Disp: 90 tablet, Rfl: 3 .  Misc Natural Products (GLUCOSAMINE-CHONDROITIN PLUS PO), Take 1 tablet by mouth daily., Disp: , Rfl:  .  mometasone (ELOCON) 0.1 % cream, Apply 1 application topically daily. (Patient not taking: Reported on 03/16/2018), Disp: 45 g, Rfl: 0 .  ranitidine (ZANTAC) 150 MG capsule, Take 1 capsule (150 mg total) by mouth 2 (two)  times daily. (Patient not taking: Reported on 02/23/2018), Disp: 60 capsule, Rfl: 12 .  sertraline (ZOLOFT) 25 MG tablet, TAKE ONE TABLET BY MOUTH ONCE DAILY, Disp: 90 tablet, Rfl: 3  Review of Systems  Constitutional: Negative.   Respiratory: Negative.   Cardiovascular: Positive for palpitations. Negative for chest pain and leg swelling.  Endocrine: Negative.   Musculoskeletal: Negative for arthralgias.  Skin: Negative.   Neurological: Negative.   Psychiatric/Behavioral: Negative for agitation. The patient is nervous/anxious.     Social History   Tobacco Use  . Smoking status: Never Smoker  . Smokeless tobacco: Never Used  Substance Use Topics  . Alcohol use: No    Alcohol/week:  0.0 oz   Objective:   BP 128/48 (BP Location: Right Arm)    Pulse 52     Temp 98.4 F (36.9 C) (Oral)    Ht 5\' 3"  (1.6 m)    Wt 164 lb 3.2 oz (74.5 kg)    BMI 29.09 kg/m   Physical Exam  Constitutional: She is oriented to person, place, and time. She appears well-developed and well-nourished.  HENT:  Head: Normocephalic and atraumatic.  Right Ear: External ear normal.  Left Ear: External ear normal.  Nose: Nose normal.  Eyes: Conjunctivae are normal. No scleral icterus.  Neck: No thyromegaly present.  Cardiovascular: Normal rate, regular rhythm and normal heart sounds.  Palpation of chest wall reproduces chest pain.  Pulmonary/Chest: Effort normal and breath sounds normal.  Abdominal: Soft.  Musculoskeletal: She exhibits no edema.  Neurological: She is alert and oriented to person, place, and time.  Skin: Skin is warm and dry.  Psychiatric: She has a normal mood and affect. Her behavior is normal. Judgment and thought content normal.        Assessment & Plan:     1. Chest pain, unspecified type Appears to be chostocondritis. - EKG 12-Lead  2. Palpitation  - TSH - Comprehensive metabolic panel  3. Other fatigue  - CBC with Differential/Platelet - TSH  4. Neuropathy  - Comprehensive metabolic panel 5.Bradycardia Cut cogeg to 3.125 BID--RTC 2-3 weeks.     I have done the exam and reviewed the above chart and it is accurate to the best of my knowledge. Development worker, community has been used in this note in any air is in the dictation or transcription are unintentional.  [name], MD  [place]

## 2018-03-16 NOTE — Telephone Encounter (Signed)
Patient called stating she spoke with Jiles Garter earlier today about a referral. She now wants to hold off on Cardiology referral until 2 weeks from now.

## 2018-03-16 NOTE — Progress Notes (Signed)
Subjective:   Stephanie Charles is a 84 y.o. female who presents for an Initial Medicare Annual Wellness Visit.  Review of Systems    N/A  Cardiac Risk Factors include: advanced age (>50men, >40 women);dyslipidemia;hypertension     Objective:    Today's Vitals   03/16/18 1048 03/16/18 1116  BP: (!) 144/52 (!) 128/48  Pulse: (!) 58 (!) 52  Temp: 98.4 F (36.9 C)   TempSrc: Oral   Weight: 164 lb 3.2 oz (74.5 kg)   Height: 5\' 3"  (1.6 m)   PainSc: 0-No pain    Body mass index is 29.09 kg/m.  Advanced Directives 03/16/2018 10/28/2015 03/25/2015 03/11/2015  Does Patient Have a Medical Advance Directive? No Yes;No Yes No  Would patient like information on creating a medical advance directive? - - - Yes - Educational materials given    Current Medications (verified) Outpatient Encounter Medications as of 03/16/2018  Medication Sig  . albuterol (PROVENTIL) (2.5 MG/3ML) 0.083% nebulizer solution Take 2.5 mg by nebulization every 4 (four) hours as needed.   Marland Kitchen allopurinol (ZYLOPRIM) 300 MG tablet TAKE ONE TABLET BY MOUTH ONCE DAILY  . amLODipine (NORVASC) 5 MG tablet Take 1 tablet (5 mg total) by mouth daily.  Marland Kitchen aspirin 81 MG tablet Take 81 mg by mouth daily.   Marland Kitchen atorvastatin (LIPITOR) 40 MG tablet TAKE ONE TABLET BY MOUTH ONCE DAILY  . carvedilol (COREG) 6.25 MG tablet Take 1 tablet (6.25 mg total) by mouth 2 (two) times daily.  . clopidogrel (PLAVIX) 75 MG tablet TAKE 1 TABLET BY MOUTH ONCE DAILY  . fluticasone (FLONASE) 50 MCG/ACT nasal spray Place 2 sprays into both nostrils daily.  . hydrALAZINE (APRESOLINE) 25 MG tablet Take 1 tablet (25 mg total) by mouth 3 (three) times daily.  . hydrochlorothiazide (HYDRODIURIL) 25 MG tablet TAKE 1 TABLET BY MOUTH ONCE DAILY  . levothyroxine (SYNTHROID, LEVOTHROID) 137 MCG tablet TAKE ONE TABLET BY MOUTH ONCE DAILY  . losartan (COZAAR) 100 MG tablet TAKE ONE TABLET BY MOUTH ONCE DAILY  . Melatonin 3 MG TBDP Take 1 tablet by mouth at bedtime.  .  sertraline (ZOLOFT) 25 MG tablet TAKE ONE TABLET BY MOUTH ONCE DAILY  . amitriptyline (ELAVIL) 10 MG tablet Take 1 tablet (10 mg total) by mouth at bedtime. (Patient not taking: Reported on 02/23/2018)  . isosorbide mononitrate (IMDUR) 30 MG 24 hr tablet Take 1 tablet (30 mg total) by mouth every morning. (Patient not taking: Reported on 03/16/2018)  . meclizine (ANTIVERT) 25 MG tablet TAKE ONE TABLET BY MOUTH THREE TIMES DAILY AS NEEDED FOR DIZZINESS (Patient not taking: Reported on 03/16/2018)  . mirtazapine (REMERON) 15 MG tablet Take 1 tablet (15 mg total) by mouth at bedtime. (Patient not taking: Reported on 02/23/2018)  . Misc Natural Products (GLUCOSAMINE-CHONDROITIN PLUS PO) Take 1 tablet by mouth daily.  . mometasone (ELOCON) 0.1 % cream Apply 1 application topically daily. (Patient not taking: Reported on 03/16/2018)  . ranitidine (ZANTAC) 150 MG capsule Take 1 capsule (150 mg total) by mouth 2 (two) times daily. (Patient not taking: Reported on 02/23/2018)   No facility-administered encounter medications on file as of 03/16/2018.     Allergies (verified) Levofloxacin; Lisinopril; Povidone-iodine; Simvastatin; and Codeine   History: History reviewed. No pertinent past medical history. Past Surgical History:  Procedure Laterality Date  . KNEE ARTHROSCOPY Right   . SHOULDER SURGERY Left    Family History  Problem Relation Age of Onset  . Heart disease Mother   .  Hypertension Mother   . Stroke Mother   . Heart disease Father   . Breast cancer Sister   . Alzheimer's disease Brother   . Heart attack Brother   . Stroke Brother   . Heart disease Brother   . Clotting disorder Daughter   . Mental illness Brother        died suicide  . Cancer Brother        prostate  . Heart disease Brother        atrial fib  . Alzheimer's disease Brother   . Fibromyalgia Daughter    Social History   Socioeconomic History  . Marital status: Divorced    Spouse name: na  . Number of children: 4   . Years of education: 29  . Highest education level: Associate degree: occupational, Hotel manager, or vocational program  Occupational History  . Occupation: retired  Scientific laboratory technician  . Financial resource strain: Not hard at all  . Food insecurity:    Worry: Never true    Inability: Never true  . Transportation needs:    Medical: No    Non-medical: No  Tobacco Use  . Smoking status: Never Smoker  . Smokeless tobacco: Never Used  Substance and Sexual Activity  . Alcohol use: No    Alcohol/week: 0.0 oz  . Drug use: No  . Sexual activity: Never  Lifestyle  . Physical activity:    Days per week: Not on file    Minutes per session: Not on file  . Stress: To some extent  Relationships  . Social connections:    Talks on phone: Not on file    Gets together: Not on file    Attends religious service: Not on file    Active member of club or organization: Not on file    Attends meetings of clubs or organizations: Not on file    Relationship status: Not on file  Other Topics Concern  . Not on file  Social History Narrative  . Not on file    Tobacco Counseling Counseling given: Not Answered   Clinical Intake:  Pre-visit preparation completed: Yes  Pain : No/denies pain Pain Score: 0-No pain     Nutritional Status: BMI 25 -29 Overweight Nutritional Risks: None, Nausea/ vomitting/ diarrhea(nausea intermittenly the past couple days - unsure of cause) Diabetes: No  How often do you need to have someone help you when you read instructions, pamphlets, or other written materials from your doctor or pharmacy?: 1 - Never  Interpreter Needed?: No  Information entered by :: [facility], LPN   Activities of Daily Living In your present state of health, do you have any difficulty performing the following activities: 03/16/2018 05/04/2017  Hearing? Tempie Donning  Comment [name]. -  Vision? N N  Difficulty concentrating or making decisions? N N  Walking or climbing stairs?  N N  Dressing or bathing? N N  Doing errands, shopping? N N  Preparing Food and eating ? N -  Using the Toilet? N -  In the past six months, have you accidently leaked urine? N -  Do you have problems with loss of bowel control? N -  Managing your Medications? N -  Managing your Finances? N -  Housekeeping or managing your Housekeeping? N -  Some recent data might be hidden     Immunizations and Health Maintenance Immunization History  Administered Date(s) Administered  . Influenza, High Dose Seasonal PF 07/03/2015, 08/05/2016, 05/25/2017  . Pneumococcal  Conjugate-13 07/03/2015  . Pneumococcal Polysaccharide-23 06/20/2013  . Tdap 02/23/2012  . Zoster 08/03/2011   There are no preventive care reminders to display for this patient.  Patient Care Team: Jerrol Banana., MD as PCP - General (Family Medicine) [place], [place] as Consulting Physician (Optometry)  Indicate any recent Medical Services you may have received from other than [facility] providers in the past year (date may be approximate).     Assessment:   This is a routine wellness examination for Otto.  Hearing/Vision screen Vision Screening Comments: Pt goes to [facility] for vision checks yearly.   Dietary issues and exercise activities discussed: Current Exercise Habits: The patient does not participate in regular exercise at present, Exercise limited by: None identified  Goals    . Increase Exercise      Recommend to start the senior class that will be once weekly for 1 hour to help with balance and strength training.       Depression Screen PHQ 2/9 Scores 03/16/2018 05/04/2017 03/25/2016  PHQ - 2 Score 1 0 0  PHQ- 9 Score - 1 -    Fall Risk Fall Risk  03/16/2018 05/04/2017 03/25/2016  Falls in the past year? No No No    Is the patient's home free of loose throw rugs in walkways, pet beds, electrical cords, etc?   yes      Grab bars in the bathroom? no      Handrails on the stairs?   no       Adequate lighting?   yes  Timed Get Up and Go Performed N/A  Cognitive Function:     6CIT Screen 03/16/2018  What Year? 0 points  What month? 0 points  What time? 0 points  Count back from 20 0 points  Months in reverse 2 points  Repeat phrase 0 points  Total Score 2    Screening Tests Health Maintenance  Topic Date Due  . INFLUENZA VACCINE  05/04/2018  . TETANUS/TDAP  02/22/2022  . DEXA SCAN  Completed  . PNA vac Low Risk Adult  Completed    Qualifies for Shingles Vaccine? Due for Shingles vaccine. Declined my offer to administer today. Education has been provided regarding the importance of this vaccine. Pt has been advised to call her insurance company to determine her out of pocket expense. Advised she may also receive this vaccine at her local pharmacy or Health Dept. Verbalized acceptance and understanding.  Cancer Screenings: Lung: Low Dose CT Chest recommended if Age 22-80 years, 30 pack-year currently smoking OR have quit w/in 15years. Patient does not qualify. Breast: Up to date on Mammogram? Yes   Up to date of Bone Density/Dexa? Yes Colorectal: Up to date  Additional Screenings:  Hepatitis C Screening:      Plan:  I have personally reviewed and addressed the Medicare Annual Wellness questionnaire and have noted the following in the patient's chart:  A. Medical and social history B. Use of alcohol, tobacco or illicit drugs  C. Current medications and supplements D. Functional ability and status E.  Nutritional status F.  Physical activity G. Advance directives H. List of other physicians I.  Hospitalizations, surgeries, and ER visits in previous 12 months J.  [place] such as hearing and vision if needed, cognitive and depression L. Referrals and appointments - none  In addition, I have reviewed and discussed with patient certain preventive protocols, quality metrics, and best practice recommendations. A written personalized care plan  for  preventive services as well as general preventive health recommendations were provided to patient.  See attached scanned questionnaire for additional information.   Signed,  Fabio Neighbors, LPN Nurse Health Advisor   Nurse Recommendations: None.

## 2018-03-17 LAB — CBC WITH DIFFERENTIAL/PLATELET
BASOS: 0 %
Basophils Absolute: 0 10*3/uL (ref 0.0–0.2)
EOS (ABSOLUTE): 0.3 10*3/uL (ref 0.0–0.4)
Eos: 3 %
HEMOGLOBIN: 9.6 g/dL — AB (ref 11.1–15.9)
Hematocrit: 30.5 % — ABNORMAL LOW (ref 34.0–46.6)
IMMATURE GRANS (ABS): 0 10*3/uL (ref 0.0–0.1)
IMMATURE GRANULOCYTES: 0 %
LYMPHS: 22 %
Lymphocytes Absolute: 1.8 10*3/uL (ref 0.7–3.1)
MCH: 25.9 pg — AB (ref 26.6–33.0)
MCHC: 31.5 g/dL (ref 31.5–35.7)
MCV: 82 fL (ref 79–97)
MONOCYTES: 7 %
Monocytes Absolute: 0.6 10*3/uL (ref 0.1–0.9)
NEUTROS PCT: 68 %
Neutrophils Absolute: 5.6 10*3/uL (ref 1.4–7.0)
Platelets: 238 10*3/uL (ref 150–450)
RBC: 3.71 x10E6/uL — ABNORMAL LOW (ref 3.77–5.28)
RDW: 15.3 % (ref 12.3–15.4)
WBC: 8.3 10*3/uL (ref 3.4–10.8)

## 2018-03-17 LAB — COMPREHENSIVE METABOLIC PANEL
A/G RATIO: 1.9 (ref 1.2–2.2)
ALT: 13 IU/L (ref 0–32)
AST: 14 IU/L (ref 0–40)
Albumin: 4.3 g/dL (ref 3.5–4.7)
Alkaline Phosphatase: 106 IU/L (ref 39–117)
BUN/Creatinine Ratio: 20 (ref 12–28)
BUN: 24 mg/dL (ref 8–27)
Bilirubin Total: 0.4 mg/dL (ref 0.0–1.2)
CALCIUM: 9.4 mg/dL (ref 8.7–10.3)
CO2: 22 mmol/L (ref 20–29)
CREATININE: 1.21 mg/dL — AB (ref 0.57–1.00)
Chloride: 101 mmol/L (ref 96–106)
GFR, EST AFRICAN AMERICAN: 47 mL/min/{1.73_m2} — AB (ref >59)
GFR, EST NON AFRICAN AMERICAN: 41 mL/min/{1.73_m2} — AB (ref >59)
GLOBULIN, TOTAL: 2.3 g/dL (ref 1.5–4.5)
Glucose: 88 mg/dL (ref 65–99)
POTASSIUM: 4.1 mmol/L (ref 3.5–5.2)
Sodium: 139 mmol/L (ref 134–144)
Total Protein: 6.6 g/dL (ref 6.0–8.5)

## 2018-03-17 LAB — TSH: TSH: 1.63 u[IU]/mL (ref 0.450–4.500)

## 2018-03-21 ENCOUNTER — Telehealth: Payer: Self-pay | Admitting: Family Medicine

## 2018-03-21 NOTE — Telephone Encounter (Signed)
Pt states she is feels sick to her stomach and isn't feeling well today.  States she knows her hemoglobin is low and she wants to know the value as well.

## 2018-03-21 NOTE — Telephone Encounter (Signed)
Patient wanted to know her Hgb level.  She said after she ate breakfast she felt better.

## 2018-03-22 ENCOUNTER — Encounter: Payer: Self-pay | Admitting: Family Medicine

## 2018-03-22 ENCOUNTER — Ambulatory Visit (INDEPENDENT_AMBULATORY_CARE_PROVIDER_SITE_OTHER): Payer: Medicare Other | Admitting: Family Medicine

## 2018-03-22 VITALS — BP 158/50 | HR 54 | Temp 98.0°F | Wt 163.4 lb

## 2018-03-22 DIAGNOSIS — N189 Chronic kidney disease, unspecified: Secondary | ICD-10-CM

## 2018-03-22 DIAGNOSIS — R350 Frequency of micturition: Secondary | ICD-10-CM

## 2018-03-22 DIAGNOSIS — D649 Anemia, unspecified: Secondary | ICD-10-CM

## 2018-03-22 DIAGNOSIS — G629 Polyneuropathy, unspecified: Secondary | ICD-10-CM

## 2018-03-22 DIAGNOSIS — R001 Bradycardia, unspecified: Secondary | ICD-10-CM | POA: Diagnosis not present

## 2018-03-22 LAB — POCT URINALYSIS DIPSTICK
BILIRUBIN UA: NEGATIVE
Blood, UA: NEGATIVE
GLUCOSE UA: NEGATIVE
Ketones, UA: NEGATIVE
Leukocytes, UA: NEGATIVE
Nitrite, UA: NEGATIVE
PH UA: 6.5 (ref 5.0–8.0)
Protein, UA: NEGATIVE
Spec Grav, UA: 1.015 (ref 1.010–1.025)
UROBILINOGEN UA: 0.2 U/dL

## 2018-03-22 MED ORDER — PANTOPRAZOLE SODIUM 40 MG PO TBEC: 40.0000 mg | DELAYED_RELEASE_TABLET | Freq: Every day | ORAL | 3 refills | Status: DC

## 2018-03-22 NOTE — Progress Notes (Signed)
Patient: Stephanie Charles Female    DOB: [birth year]   84 y.o.   MRN: [id number] Visit Date: 03/22/2018  Today's Provider: [name], MD   Chief Complaint  Patient presents with  . Bradycardia   Subjective:    HPI Bradycardia:  Patient presents for a 2 week follow up. Last OV was on 03/16/18. Patient advised to decrease Coreg to 125 mg BID. She reports good compliance with treatment plan. She states her pulse is still fluctuating along with her blood pressure. She also reports feeling fatigue, headache and had some N/V yesterday. This morning she is nauseated and has diarrhea.  No syncope,CP,DOE.  Pulse Readings from Last 3 Encounters:  03/22/18 (!) 54  03/16/18 (!) 52  02/23/18 (!) 58   BP Readings from Last 3 Encounters:  03/22/18 (!) 158/50  03/16/18 (!) 128/48  02/23/18 (!) 142/58     Allergies  Allergen Reactions  . Levofloxacin     Mouth sores  . Lisinopril     Cough?  . Povidone-Iodine Other (See Comments)    Severe blistering and itchiness. Severe redness  . Simvastatin     Myalgias  . Codeine Nausea And Vomiting     Current Outpatient Medications:  .  albuterol (PROVENTIL) (2.5 MG/3ML) 0.083% nebulizer solution, Take 2.5 mg by nebulization every 4 (four) hours as needed. , Disp: , Rfl:  .  allopurinol (ZYLOPRIM) 300 MG tablet, TAKE ONE TABLET BY MOUTH ONCE DAILY, Disp: 90 tablet, Rfl: 3 .  amLODipine (NORVASC) 5 MG tablet, Take 1 tablet (5 mg total) by mouth daily., Disp: 30 tablet, Rfl: 12 .  aspirin 81 MG tablet, Take 81 mg by mouth daily. , Disp: , Rfl:  .  atorvastatin (LIPITOR) 40 MG tablet, TAKE ONE TABLET BY MOUTH ONCE DAILY, Disp: 90 tablet, Rfl: 3 .  carvedilol (COREG) 6.25 MG tablet, Take 1 tablet (6.25 mg total) by mouth 2 (two) times daily. (Patient taking differently: Take 1.25 mg by mouth 2 (two) times daily. ), Disp: 180 tablet, Rfl: 3 .  clopidogrel (PLAVIX) 75 MG tablet, TAKE 1 TABLET BY MOUTH ONCE DAILY, Disp: 90 tablet, Rfl: 3 .   fluticasone (FLONASE) 50 MCG/ACT nasal spray, Place 2 sprays into both nostrils daily., Disp: 16 g, Rfl: 11 .  hydrALAZINE (APRESOLINE) 25 MG tablet, Take 1 tablet (25 mg total) by mouth 3 (three) times daily., Disp: 270 tablet, Rfl: 3 .  hydrochlorothiazide (HYDRODIURIL) 25 MG tablet, TAKE 1 TABLET BY MOUTH ONCE DAILY, Disp: 90 tablet, Rfl: 3 .  isosorbide mononitrate (IMDUR) 30 MG 24 hr tablet, Take 1 tablet (30 mg total) by mouth every morning., Disp: 30 tablet, Rfl: 12 .  levothyroxine (SYNTHROID, LEVOTHROID) 137 MCG tablet, TAKE ONE TABLET BY MOUTH ONCE DAILY, Disp: 90 tablet, Rfl: 4 .  losartan (COZAAR) 100 MG tablet, TAKE ONE TABLET BY MOUTH ONCE DAILY, Disp: 90 tablet, Rfl: 3 .  meclizine (ANTIVERT) 25 MG tablet, TAKE ONE TABLET BY MOUTH THREE TIMES DAILY AS NEEDED FOR DIZZINESS, Disp: 30 tablet, Rfl: 12 .  Melatonin 3 MG TBDP, Take 1 tablet by mouth at bedtime., Disp: , Rfl:  .  mirtazapine (REMERON) 15 MG tablet, Take 1 tablet (15 mg total) by mouth at bedtime., Disp: 90 tablet, Rfl: 3 .  Misc Natural Products (GLUCOSAMINE-CHONDROITIN PLUS PO), Take 1 tablet by mouth daily., Disp: , Rfl:  .  mometasone (ELOCON) 0.1 % cream, Apply 1 application topically daily., Disp: 45 g, Rfl: 0 .  ranitidine (  ZANTAC) 150 MG capsule, Take 1 capsule (150 mg total) by mouth 2 (two) times daily., Disp: 60 capsule, Rfl: 12 .  sertraline (ZOLOFT) 25 MG tablet, TAKE ONE TABLET BY MOUTH ONCE DAILY, Disp: 90 tablet, Rfl: 3 .  amitriptyline (ELAVIL) 10 MG tablet, Take 1 tablet (10 mg total) by mouth at bedtime. (Patient not taking: Reported on 02/23/2018), Disp: 90 tablet, Rfl: 3 .  pantoprazole (PROTONIX) 40 MG tablet, Take 1 tablet (40 mg total) by mouth daily., Disp: 90 tablet, Rfl: 3  Review of Systems  Constitutional: Negative.   HENT: Negative.   Eyes: Negative.   Respiratory: Negative.   Cardiovascular: Negative.   Endocrine: Negative.   Allergic/Immunologic: Negative.   Neurological: Negative.     Psychiatric/Behavioral: Negative.     Social History   Tobacco Use  . Smoking status: Never Smoker  . Smokeless tobacco: Never Used  Substance Use Topics  . Alcohol use: No    Alcohol/week: 0.0 oz   Objective:   BP (!) 158/50 (BP Location: Right Arm, Patient Position: Sitting, Cuff Size: Normal)   Pulse (!) 54   Temp 98 F (36.7 C) (Oral)   Wt 163 lb 6.4 oz (74.1 kg)   SpO2 97%   BMI 28.95 kg/m    Physical Exam  Constitutional: She is oriented to person, place, and time. She appears well-developed and well-nourished.  HENT:  Head: Normocephalic and atraumatic.  Right Ear: External ear normal.  Left Ear: External ear normal.  Nose: Nose normal.  Eyes: Conjunctivae are normal. No scleral icterus.  Neck: No thyromegaly present.  Cardiovascular: Normal rate, regular rhythm and normal heart sounds.  Right carotid bruit.  Pulmonary/Chest: Effort normal and breath sounds normal.  Abdominal: Soft.  Musculoskeletal: She exhibits no edema.  Neurological: She is alert and oriented to person, place, and time.  Skin: Skin is warm and dry.  Psychiatric: She has a normal mood and affect. Her behavior is normal. Judgment and thought content normal.        Assessment & Plan:     1. Urinary frequency  - POCT Urinalysis Dipstick  2. Bradycardia Cut coreg dose. RTC 1 week.  3. Chronic kidney disease, unspecified CKD stage  - CBC with Differential - Comprehensive metabolic panel  4. Anemia, unspecified type  - Iron  5. Neuropathy  - Vitamin B12       [name], MD  [facility]

## 2018-03-23 ENCOUNTER — Telehealth: Payer: Self-pay

## 2018-03-23 LAB — CBC WITH DIFFERENTIAL/PLATELET
Basophils Absolute: 0 10*3/uL (ref 0.0–0.2)
Basos: 1 %
EOS (ABSOLUTE): 0.2 10*3/uL (ref 0.0–0.4)
Eos: 2 %
HEMATOCRIT: 30.9 % — AB (ref 34.0–46.6)
Hemoglobin: 9.9 g/dL — ABNORMAL LOW (ref 11.1–15.9)
IMMATURE GRANULOCYTES: 0 %
Immature Grans (Abs): 0 10*3/uL (ref 0.0–0.1)
LYMPHS ABS: 1.7 10*3/uL (ref 0.7–3.1)
Lymphs: 20 %
MCH: 25.5 pg — ABNORMAL LOW (ref 26.6–33.0)
MCHC: 32 g/dL (ref 31.5–35.7)
MCV: 80 fL (ref 79–97)
MONOS ABS: 0.4 10*3/uL (ref 0.1–0.9)
Monocytes: 4 %
NEUTROS PCT: 73 %
Neutrophils Absolute: 6.3 10*3/uL (ref 1.4–7.0)
PLATELETS: 247 10*3/uL (ref 150–450)
RBC: 3.88 x10E6/uL (ref 3.77–5.28)
RDW: 15.1 % (ref 12.3–15.4)
WBC: 8.6 10*3/uL (ref 3.4–10.8)

## 2018-03-23 LAB — COMPREHENSIVE METABOLIC PANEL
A/G RATIO: 2.1 (ref 1.2–2.2)
ALT: 13 IU/L (ref 0–32)
AST: 18 IU/L (ref 0–40)
Albumin: 4.5 g/dL (ref 3.5–4.7)
Alkaline Phosphatase: 113 IU/L (ref 39–117)
BILIRUBIN TOTAL: 0.4 mg/dL (ref 0.0–1.2)
BUN/Creatinine Ratio: 15 (ref 12–28)
BUN: 17 mg/dL (ref 8–27)
CALCIUM: 9.4 mg/dL (ref 8.7–10.3)
CHLORIDE: 96 mmol/L (ref 96–106)
CO2: 23 mmol/L (ref 20–29)
Creatinine, Ser: 1.11 mg/dL — ABNORMAL HIGH (ref 0.57–1.00)
GFR calc Af Amer: 52 mL/min/{1.73_m2} — ABNORMAL LOW (ref >59)
GFR calc non Af Amer: 45 mL/min/{1.73_m2} — ABNORMAL LOW (ref >59)
GLOBULIN, TOTAL: 2.1 g/dL (ref 1.5–4.5)
Glucose: 106 mg/dL — ABNORMAL HIGH (ref 65–99)
POTASSIUM: 4.5 mmol/L (ref 3.5–5.2)
SODIUM: 138 mmol/L (ref 134–144)
TOTAL PROTEIN: 6.6 g/dL (ref 6.0–8.5)

## 2018-03-23 LAB — VITAMIN B12: Vitamin B-12: 585 pg/mL (ref 232–1245)

## 2018-03-23 LAB — IRON: Iron: 25 ug/dL — ABNORMAL LOW (ref 27–139)

## 2018-03-23 NOTE — Telephone Encounter (Signed)
Pt advised.   Thanks,   -Laura  

## 2018-03-23 NOTE — Telephone Encounter (Signed)
-----   Message from Jerrol Banana., MD sent at 03/23/2018  8:56 AM EDT ----- Anemia stable but iron low--start daily iron--keep f/u appt.

## 2018-03-28 ENCOUNTER — Other Ambulatory Visit: Payer: Self-pay | Admitting: Family Medicine

## 2018-03-28 DIAGNOSIS — J301 Allergic rhinitis due to pollen: Secondary | ICD-10-CM

## 2018-03-28 DIAGNOSIS — I10 Essential (primary) hypertension: Secondary | ICD-10-CM

## 2018-03-29 ENCOUNTER — Ambulatory Visit: Payer: Self-pay | Admitting: Family Medicine

## 2018-03-29 ENCOUNTER — Encounter: Payer: Self-pay | Admitting: Family Medicine

## 2018-03-29 ENCOUNTER — Ambulatory Visit (INDEPENDENT_AMBULATORY_CARE_PROVIDER_SITE_OTHER): Payer: Medicare Other | Admitting: Family Medicine

## 2018-03-29 VITALS — BP 142/50 | HR 62 | Temp 98.8°F | Resp 18 | Wt 163.0 lb

## 2018-03-29 DIAGNOSIS — I6529 Occlusion and stenosis of unspecified carotid artery: Secondary | ICD-10-CM

## 2018-03-29 DIAGNOSIS — D649 Anemia, unspecified: Secondary | ICD-10-CM

## 2018-03-29 DIAGNOSIS — R001 Bradycardia, unspecified: Secondary | ICD-10-CM | POA: Diagnosis not present

## 2018-03-29 DIAGNOSIS — I10 Essential (primary) hypertension: Secondary | ICD-10-CM | POA: Diagnosis not present

## 2018-03-29 NOTE — Progress Notes (Signed)
Patient: Stephanie Charles Female    DOB: Mar 10, 1930   84 y.o.   MRN: [id number] Visit Date: 03/29/2018  Today's Provider: [name], MD   Chief Complaint  Patient presents with  . Bradycardia    follow up  . Anemia    follow up   Subjective:    HPI Bradycardia Patient was last seen for this problem 1 week ago. Changes made during that visit includes decreasing the dosage of Coreg to 1/2 pill daily and advising patient to follow up in 1 week. Today patient comes in reporting good compliance with treatment. She states she has had some heart flutters.   Anemia Patient was last seen for this problem 1 week ago. Management during that visit includes ordering labs which showed Anemia stable but iron low--start daily iron--keep f/u appt. Patient reports fair compliance. Patient states she started taking a multivitamin that contains 18mg  of iron include    Allergies  Allergen Reactions  . Levofloxacin     Mouth sores  . Lisinopril     Cough?  . Povidone-Iodine Other (See Comments)    Severe blistering and itchiness. Severe redness  . Simvastatin     Myalgias  . Codeine Nausea And Vomiting     Current Outpatient Medications:  .  albuterol (PROVENTIL) (2.5 MG/3ML) 0.083% nebulizer solution, Take 2.5 mg by nebulization every 4 (four) hours as needed. , Disp: , Rfl:  .  allopurinol (ZYLOPRIM) 300 MG tablet, TAKE ONE TABLET BY MOUTH ONCE DAILY, Disp: 90 tablet, Rfl: 3 .  amitriptyline (ELAVIL) 10 MG tablet, Take 1 tablet (10 mg total) by mouth at bedtime., Disp: 90 tablet, Rfl: 3 .  amLODipine (NORVASC) 5 MG tablet, Take 1 tablet (5 mg total) by mouth daily., Disp: 30 tablet, Rfl: 12 .  aspirin 81 MG tablet, Take 81 mg by mouth daily. , Disp: , Rfl:  .  atorvastatin (LIPITOR) 40 MG tablet, TAKE ONE TABLET BY MOUTH ONCE DAILY, Disp: 90 tablet, Rfl: 3 .  carvedilol (COREG) 6.25 MG tablet, Take 1 tablet (6.25 mg total) by mouth 2 (two) times daily. (Patient taking  differently: Take 1.25 mg by mouth 2 (two) times daily. ), Disp: 180 tablet, Rfl: 3 .  clopidogrel (PLAVIX) 75 MG tablet, TAKE 1 TABLET BY MOUTH ONCE DAILY, Disp: 90 tablet, Rfl: 3 .  fluticasone (FLONASE) 50 MCG/ACT nasal spray, USE 2 SPRAY(S) IN EACH NOSTRIL ONCE DAILY, Disp: 16 g, Rfl: 11 .  hydrALAZINE (APRESOLINE) 25 MG tablet, Take 1 tablet (25 mg total) by mouth 3 (three) times daily., Disp: 270 tablet, Rfl: 3 .  hydrochlorothiazide (HYDRODIURIL) 25 MG tablet, TAKE 1 TABLET BY MOUTH ONCE DAILY, Disp: 90 tablet, Rfl: 3 .  hydrochlorothiazide (HYDRODIURIL) 25 MG tablet, TAKE 1 TABLET BY MOUTH ONCE DAILY, Disp: 90 tablet, Rfl: 3 .  isosorbide mononitrate (IMDUR) 30 MG 24 hr tablet, Take 1 tablet (30 mg total) by mouth every morning., Disp: 30 tablet, Rfl: 12 .  levothyroxine (SYNTHROID, LEVOTHROID) 137 MCG tablet, TAKE ONE TABLET BY MOUTH ONCE DAILY, Disp: 90 tablet, Rfl: 4 .  losartan (COZAAR) 100 MG tablet, TAKE ONE TABLET BY MOUTH ONCE DAILY, Disp: 90 tablet, Rfl: 3 .  meclizine (ANTIVERT) 25 MG tablet, TAKE ONE TABLET BY MOUTH THREE TIMES DAILY AS NEEDED FOR DIZZINESS, Disp: 30 tablet, Rfl: 12 .  Melatonin 3 MG TBDP, Take 1 tablet by mouth at bedtime., Disp: , Rfl:  .  mirtazapine (REMERON) 15 MG tablet,  Take 1 tablet (15 mg total) by mouth at bedtime., Disp: 90 tablet, Rfl: 3 .  Misc Natural Products (GLUCOSAMINE-CHONDROITIN PLUS PO), Take 1 tablet by mouth daily., Disp: , Rfl:  .  mometasone (ELOCON) 0.1 % cream, Apply 1 application topically daily., Disp: 45 g, Rfl: 0 .  Multiple Vitamin (MULTIVITAMIN) tablet, Take 1 tablet by mouth daily., Disp: , Rfl:  .  pantoprazole (PROTONIX) 40 MG tablet, Take 1 tablet (40 mg total) by mouth daily., Disp: 90 tablet, Rfl: 3 .  ranitidine (ZANTAC) 150 MG capsule, Take 1 capsule (150 mg total) by mouth 2 (two) times daily., Disp: 60 capsule, Rfl: 12 .  sertraline (ZOLOFT) 25 MG tablet, TAKE ONE TABLET BY MOUTH ONCE DAILY, Disp: 90 tablet, Rfl: 3 .   vitamin C (ASCORBIC ACID) 500 MG tablet, Take 500 mg by mouth daily., Disp: , Rfl:   Review of Systems  Constitutional: Positive for fatigue. Negative for appetite change, chills and fever.  HENT: Negative.   Eyes: Negative.   Respiratory: Negative for chest tightness and shortness of breath.   Cardiovascular: Positive for palpitations. Negative for chest pain.  Gastrointestinal: Negative for abdominal pain, nausea and vomiting.  Endocrine: Negative.   Musculoskeletal: Positive for arthralgias.  Allergic/Immunologic: Negative.   Neurological: Positive for weakness. Negative for dizziness.  Psychiatric/Behavioral: Negative.     Social History   Tobacco Use  . Smoking status: Never Smoker  . Smokeless tobacco: Never Used  Substance Use Topics  . Alcohol use: No    Alcohol/week: 0.0 oz   Objective:   BP (!) 142/50 (BP Location: Left Arm, Patient Position: Sitting, Cuff Size: Normal)   Pulse 62   Temp 98.8 F (37.1 C) (Oral)   Resp 18   Wt 163 lb (73.9 kg)   SpO2 96% Comment: room air  BMI 28.87 kg/m  Vitals:   03/29/18 1426  BP: (!) 142/50  Pulse: 62  Resp: 18  Temp: 98.8 F (37.1 C)  TempSrc: Oral  SpO2: 96%  Weight: 163 lb (73.9 kg)     Physical Exam  Constitutional: She is oriented to person, place, and time. She appears well-developed and well-nourished.  HENT:  Head: Normocephalic and atraumatic.  Right Ear: External ear normal.  Left Ear: External ear normal.  Nose: Nose normal.  Eyes: Conjunctivae are normal. No scleral icterus.  Neck: No thyromegaly present.  Cardiovascular: Normal rate, regular rhythm and normal heart sounds.  Pulmonary/Chest: Effort normal and breath sounds normal.  Abdominal: Soft.  Musculoskeletal: She exhibits no edema.  Neurological: She is alert and oriented to person, place, and time.  Skin: Skin is warm and dry.  Psychiatric: She has a normal mood and affect. Her behavior is normal. Judgment and thought content normal.         Assessment & Plan:     1. Bradycardia Better.  2. Anemia, unspecified type Will recheck labs and send patient home with OC light. Follow up in 1 month.  - CBC with Differential/Platelet - Iron May need GI then possibly hematology referral. 3.Fatigue Secondary to anemia 4.HTN Other problems stable.    I have done the exam and reviewed the chart and it is accurate to the best of my knowledge. Development worker, community has been used and  any errors in dictation or transcription are unintentional. Miguel Aschoff M.D. [place], MD  [facility]

## 2018-03-29 NOTE — Patient Instructions (Signed)
Iron-Rich Diet Iron is a mineral that helps your body to produce hemoglobin. Hemoglobin is a protein in your red blood cells that carries oxygen to your body's tissues. Eating too little iron may cause you to feel weak and tired, and it can increase your risk for infection. Eating enough iron is necessary for your body's metabolism, muscle function, and nervous system. Iron is naturally found in many foods. It can also be added to foods or fortified in foods. There are two types of dietary iron:  Heme iron. Heme iron is absorbed by the body more easily than nonheme iron. Heme iron is found in meat, poultry, and fish.  Nonheme iron. Nonheme iron is found in dietary supplements, iron-fortified grains, beans, and vegetables.  You may need to follow an iron-rich diet if:  You have been diagnosed with iron deficiency or iron-deficiency anemia.  You have a condition that prevents you from absorbing dietary iron, such as: ? Infection in your intestines. ? Celiac disease. This involves long-lasting (chronic) inflammation of your intestines.  You do not eat enough iron.  You eat a diet that is high in foods that impair iron absorption.  You have lost a lot of blood.  You have heavy bleeding during your menstrual cycle.  You are pregnant.  What is my plan? Your health care provider may help you to determine how much iron you need per day based on your condition. Generally, when a person consumes sufficient amounts of iron in the diet, the following iron needs are met:  Men. ? 14-18 years old: 11 mg per day. ? 19-50 years old: 8 mg per day.  Women. ? 14-18 years old: 15 mg per day. ? 19-50 years old: 18 mg per day. ? Over 50 years old: 8 mg per day. ? Pregnant women: 27 mg per day. ? Breastfeeding women: 9 mg per day.  What do I need to know about an iron-rich diet?  Eat fresh fruits and vegetables that are high in vitamin C along with foods that are high in iron. This will help  increase the amount of iron that your body absorbs from food, especially with foods containing nonheme iron. Foods that are high in vitamin C include oranges, peppers, tomatoes, and mango.  Take iron supplements only as directed by your health care provider. Overdose of iron can be life-threatening. If you were prescribed iron supplements, take them with orange juice or a vitamin C supplement.  Cook foods in pots and pans that are made from iron.  Eat nonheme iron-containing foods alongside foods that are high in heme iron. This helps to improve your iron absorption.  Certain foods and drinks contain compounds that impair iron absorption. Avoid eating these foods in the same meal as iron-rich foods or with iron supplements. These include: ? Coffee, black tea, and red wine. ? Milk, dairy products, and foods that are high in calcium. ? Beans, soybeans, and peas. ? Whole grains.  When eating foods that contain both nonheme iron and compounds that impair iron absorption, follow these tips to absorb iron better. ? Soak beans overnight before cooking. ? Soak whole grains overnight and drain them before using. ? Ferment flours before baking, such as using yeast in bread dough. What foods can I eat? Grains Iron-fortified breakfast cereal. Iron-fortified whole-wheat bread. Enriched rice. Sprouted grains. Vegetables Spinach. Potatoes with skin. Green peas. Broccoli. Red and green bell peppers. Fermented vegetables. Fruits Prunes. Raisins. Oranges. Strawberries. Mango. Grapefruit. Meats and Other Protein Sources   Beef liver. Oysters. Beef. Shrimp. [place]. Chicken. [place]. Sardines. Chickpeas. Nuts. Tofu. Beverages Tomato juice. Fresh orange juice. Prune juice. Hibiscus tea. Fortified instant breakfast shakes. Condiments Tahini. Fermented soy sauce. Sweets and Desserts Black-strap molasses. Other Wheat germ. The items listed above may not be a complete list of recommended foods or beverages.  Contact your dietitian for more options. What foods are not recommended? Grains Whole grains. Bran cereal. Bran flour. Oats. Vegetables Artichokes. Brussels sprouts. Kale. Fruits Blueberries. Raspberries. Strawberries. Figs. Meats and Other Protein Sources Soybeans. Products made from soy protein. Dairy Milk. Cream. Cheese. Yogurt. Cottage cheese. Beverages Coffee. Black tea. Red wine. Sweets and Desserts Cocoa. Chocolate. Ice cream. Other Basil. Oregano. Parsley. The items listed above may not be a complete list of foods and beverages to avoid. Contact your dietitian for more information. This information is not intended to replace advice given to you by your health care provider. Make sure you discuss any questions you have with your health care provider. Document Released: 05/04/2005 Document Revised: 04/09/2016 Document Reviewed: 04/17/2014 Elsevier Interactive Patient Education  [employer].

## 2018-03-30 LAB — CBC WITH DIFFERENTIAL/PLATELET
BASOS ABS: 0 10*3/uL (ref 0.0–0.2)
BASOS: 1 %
EOS (ABSOLUTE): 0.2 10*3/uL (ref 0.0–0.4)
Eos: 3 %
Hematocrit: 29.5 % — ABNORMAL LOW (ref 34.0–46.6)
Hemoglobin: 9.1 g/dL — ABNORMAL LOW (ref 11.1–15.9)
IMMATURE GRANS (ABS): 0 10*3/uL (ref 0.0–0.1)
Immature Granulocytes: 0 %
LYMPHS: 25 %
Lymphocytes Absolute: 2 10*3/uL (ref 0.7–3.1)
MCH: 25.1 pg — ABNORMAL LOW (ref 26.6–33.0)
MCHC: 30.8 g/dL — AB (ref 31.5–35.7)
MCV: 82 fL (ref 79–97)
Monocytes Absolute: 0.6 10*3/uL (ref 0.1–0.9)
Monocytes: 8 %
NEUTROS ABS: 5 10*3/uL (ref 1.4–7.0)
Neutrophils: 63 %
PLATELETS: 227 10*3/uL (ref 150–450)
RBC: 3.62 x10E6/uL — ABNORMAL LOW (ref 3.77–5.28)
RDW: 14.5 % (ref 12.3–15.4)
WBC: 7.8 10*3/uL (ref 3.4–10.8)

## 2018-03-30 LAB — IRON: IRON: 183 ug/dL — AB (ref 27–139)

## 2018-04-05 ENCOUNTER — Telehealth: Payer: Self-pay

## 2018-04-05 DIAGNOSIS — D649 Anemia, unspecified: Secondary | ICD-10-CM

## 2018-04-05 DIAGNOSIS — K921 Melena: Secondary | ICD-10-CM

## 2018-04-05 NOTE — Telephone Encounter (Signed)
Order for referral has been placed.

## 2018-04-05 NOTE — Telephone Encounter (Signed)
-----   Message from Jerrol Banana., MD sent at 04/03/2018  8:15 AM EDT ----- Repeat CBC this week--refer to GI first then hematology.

## 2018-04-10 ENCOUNTER — Telehealth: Payer: Self-pay | Admitting: Family Medicine

## 2018-04-10 DIAGNOSIS — R109 Unspecified abdominal pain: Secondary | ICD-10-CM

## 2018-04-10 DIAGNOSIS — R195 Other fecal abnormalities: Secondary | ICD-10-CM

## 2018-04-10 NOTE — Telephone Encounter (Signed)
Pt stated that she dropped off stool sample last week and is requesting call back for the results. Please advise. Thanks TNP

## 2018-04-10 NOTE — Telephone Encounter (Signed)
OC lite was positive.

## 2018-04-11 ENCOUNTER — Telehealth: Payer: Self-pay | Admitting: Family Medicine

## 2018-04-11 NOTE — Telephone Encounter (Signed)
Patient was advised and referral for GI was placed.

## 2018-04-11 NOTE — Telephone Encounter (Signed)
Pt called saying she is having some pains in her lower abd.  She also ask about her referral to GI.  She wants to know how long will it be before she will see the GI since she is continuing to have abd pain   Pt's CB is [phone]  Thanks teri

## 2018-04-11 NOTE — Telephone Encounter (Signed)
Advised patient that GI referral has been placed and that she should hear from them soon. Pains just started about 2 hours ago. Advised pt that if they become severe to go to the ER. Patient verbalized understanding.

## 2018-04-12 ENCOUNTER — Telehealth: Payer: Self-pay | Admitting: Gastroenterology

## 2018-04-12 NOTE — Telephone Encounter (Signed)
[facility] is trying to get this pt to be seen as soon as possible pt has seen Dr. Serita Sheller. 2015 could you find a spot to get her seen?

## 2018-04-13 ENCOUNTER — Encounter: Payer: Self-pay | Admitting: Gastroenterology

## 2018-04-13 DIAGNOSIS — R001 Bradycardia, unspecified: Secondary | ICD-10-CM | POA: Diagnosis not present

## 2018-04-13 DIAGNOSIS — R0989 Other specified symptoms and signs involving the circulatory and respiratory systems: Secondary | ICD-10-CM | POA: Diagnosis not present

## 2018-04-13 DIAGNOSIS — Z96653 Presence of artificial knee joint, bilateral: Secondary | ICD-10-CM | POA: Diagnosis not present

## 2018-04-13 DIAGNOSIS — I447 Left bundle-branch block, unspecified: Secondary | ICD-10-CM | POA: Diagnosis not present

## 2018-04-13 DIAGNOSIS — Z7982 Long term (current) use of aspirin: Secondary | ICD-10-CM | POA: Diagnosis not present

## 2018-04-13 DIAGNOSIS — Z8673 Personal history of transient ischemic attack (TIA), and cerebral infarction without residual deficits: Secondary | ICD-10-CM | POA: Diagnosis not present

## 2018-04-13 DIAGNOSIS — I517 Cardiomegaly: Secondary | ICD-10-CM | POA: Diagnosis not present

## 2018-04-13 DIAGNOSIS — R1031 Right lower quadrant pain: Secondary | ICD-10-CM | POA: Diagnosis not present

## 2018-04-13 DIAGNOSIS — R0602 Shortness of breath: Secondary | ICD-10-CM | POA: Diagnosis not present

## 2018-04-13 DIAGNOSIS — R1032 Left lower quadrant pain: Secondary | ICD-10-CM | POA: Diagnosis not present

## 2018-04-13 DIAGNOSIS — D649 Anemia, unspecified: Secondary | ICD-10-CM | POA: Diagnosis not present

## 2018-04-13 DIAGNOSIS — R935 Abnormal findings on diagnostic imaging of other abdominal regions, including retroperitoneum: Secondary | ICD-10-CM | POA: Diagnosis not present

## 2018-04-13 DIAGNOSIS — Z8679 Personal history of other diseases of the circulatory system: Secondary | ICD-10-CM | POA: Diagnosis not present

## 2018-04-13 DIAGNOSIS — I440 Atrioventricular block, first degree: Secondary | ICD-10-CM | POA: Diagnosis not present

## 2018-04-13 DIAGNOSIS — K5732 Diverticulitis of large intestine without perforation or abscess without bleeding: Secondary | ICD-10-CM | POA: Diagnosis not present

## 2018-04-13 DIAGNOSIS — E039 Hypothyroidism, unspecified: Secondary | ICD-10-CM | POA: Diagnosis not present

## 2018-04-13 DIAGNOSIS — R110 Nausea: Secondary | ICD-10-CM | POA: Diagnosis not present

## 2018-04-13 DIAGNOSIS — K5792 Diverticulitis of intestine, part unspecified, without perforation or abscess without bleeding: Secondary | ICD-10-CM | POA: Insufficient documentation

## 2018-04-13 DIAGNOSIS — I10 Essential (primary) hypertension: Secondary | ICD-10-CM | POA: Diagnosis not present

## 2018-04-13 DIAGNOSIS — Z8669 Personal history of other diseases of the nervous system and sense organs: Secondary | ICD-10-CM | POA: Diagnosis not present

## 2018-04-13 DIAGNOSIS — I252 Old myocardial infarction: Secondary | ICD-10-CM | POA: Diagnosis not present

## 2018-04-14 MED ORDER — METRONIDAZOLE 500 MG PO TABS
500.00 | ORAL_TABLET | ORAL | Status: DC
Start: 2018-04-14 — End: 2018-04-14

## 2018-04-14 MED ORDER — CARVEDILOL 3.125 MG PO TABS
3.13 | ORAL_TABLET | ORAL | Status: DC
Start: 2018-04-14 — End: 2018-04-14

## 2018-04-14 MED ORDER — ACETAMINOPHEN 500 MG PO TABS
1000.00 | ORAL_TABLET | ORAL | Status: DC
Start: ? — End: 2018-04-14

## 2018-04-14 MED ORDER — AMLODIPINE BESYLATE 5 MG PO TABS
5.00 | ORAL_TABLET | ORAL | Status: DC
Start: 2018-04-13 — End: 2018-04-14

## 2018-04-14 MED ORDER — SODIUM CHLORIDE (HYPERTONIC) 5 % OP SOLN
1.00 | OPHTHALMIC | Status: DC
Start: ? — End: 2018-04-14

## 2018-04-14 MED ORDER — HYDRALAZINE HCL 25 MG PO TABS
25.00 | ORAL_TABLET | ORAL | Status: DC
Start: 2018-04-14 — End: 2018-04-14

## 2018-04-14 MED ORDER — ALLOPURINOL 300 MG PO TABS
300.00 | ORAL_TABLET | ORAL | Status: DC
Start: 2018-04-14 — End: 2018-04-14

## 2018-04-14 MED ORDER — ATORVASTATIN CALCIUM 40 MG PO TABS
40.00 | ORAL_TABLET | ORAL | Status: DC
Start: 2018-04-14 — End: 2018-04-14

## 2018-04-14 MED ORDER — HYDROCHLOROTHIAZIDE 25 MG PO TABS
25.00 | ORAL_TABLET | ORAL | Status: DC
Start: 2018-04-15 — End: 2018-04-14

## 2018-04-14 MED ORDER — PROMETHAZINE HCL 25 MG PO TABS
12.50 | ORAL_TABLET | ORAL | Status: DC
Start: ? — End: 2018-04-14

## 2018-04-14 MED ORDER — GENERIC EXTERNAL MEDICATION
137.00 | Status: DC
Start: 2018-04-15 — End: 2018-04-14

## 2018-04-14 MED ORDER — CIPROFLOXACIN HCL 250 MG PO TABS
500.00 | ORAL_TABLET | ORAL | Status: DC
Start: 2018-04-14 — End: 2018-04-14

## 2018-04-14 MED ORDER — FLUTICASONE PROPIONATE 50 MCG/ACT NA SUSP
2.00 | NASAL | Status: DC
Start: ? — End: 2018-04-14

## 2018-04-14 MED ORDER — SERTRALINE HCL 25 MG PO TABS
25.00 | ORAL_TABLET | ORAL | Status: DC
Start: 2018-04-14 — End: 2018-04-14

## 2018-04-14 MED ORDER — MELATONIN 3 MG PO TABS
6.00 | ORAL_TABLET | ORAL | Status: DC
Start: 2018-04-14 — End: 2018-04-14

## 2018-04-14 MED ORDER — TRAZODONE HCL 50 MG PO TABS
25.00 | ORAL_TABLET | ORAL | Status: DC
Start: 2018-04-14 — End: 2018-04-14

## 2018-04-17 ENCOUNTER — Encounter: Payer: Self-pay | Admitting: Family Medicine

## 2018-04-17 ENCOUNTER — Ambulatory Visit (INDEPENDENT_AMBULATORY_CARE_PROVIDER_SITE_OTHER): Payer: Medicare Other | Admitting: Family Medicine

## 2018-04-17 VITALS — BP 122/60 | HR 64 | Temp 98.6°F | Resp 16 | Wt 159.0 lb

## 2018-04-17 DIAGNOSIS — F3341 Major depressive disorder, recurrent, in partial remission: Secondary | ICD-10-CM | POA: Diagnosis not present

## 2018-04-17 DIAGNOSIS — K5792 Diverticulitis of intestine, part unspecified, without perforation or abscess without bleeding: Secondary | ICD-10-CM | POA: Diagnosis not present

## 2018-04-17 DIAGNOSIS — Z8719 Personal history of other diseases of the digestive system: Secondary | ICD-10-CM

## 2018-04-17 DIAGNOSIS — Z09 Encounter for follow-up examination after completed treatment for conditions other than malignant neoplasm: Secondary | ICD-10-CM

## 2018-04-17 MED ORDER — MIRTAZAPINE 30 MG PO TABS: 30.0000 mg | ORAL_TABLET | Freq: Every day | ORAL | 3 refills | Status: DC

## 2018-04-17 NOTE — Progress Notes (Signed)
Patient: Stephanie Charles Female    DOB: Oct 11, 1929   84 y.o.   MRN: [id number] Visit Date: 04/17/2018  Today's Provider: [name], MD   Chief Complaint  Patient presents with  . Hospitalization Follow-up   Subjective:    HPI  Follow Up ER Visit  Patient is here for ER follow up.  She was recently seen at [facility] for Diverticulitis on 04/13/2018. Treatment for this included starting Cipro and Flagyl for 7 days. She reports satisfactory compliance with treatment. She reports this condition is Improved. However, she reports that she is still having a lot of diarrhea.       Allergies  Allergen Reactions  . Levofloxacin     Mouth sores  . Lisinopril     Cough?  . Povidone-Iodine Other (See Comments)    Severe blistering and itchiness. Severe redness  . Simvastatin     Myalgias  . Codeine Nausea And Vomiting     Current Outpatient Medications:  .  allopurinol (ZYLOPRIM) 300 MG tablet, TAKE ONE TABLET BY MOUTH ONCE DAILY, Disp: 90 tablet, Rfl: 3 .  amLODipine (NORVASC) 5 MG tablet, Take 1 tablet (5 mg total) by mouth daily., Disp: 30 tablet, Rfl: 12 .  aspirin 81 MG tablet, Take 81 mg by mouth daily. , Disp: , Rfl:  .  atorvastatin (LIPITOR) 40 MG tablet, TAKE ONE TABLET BY MOUTH ONCE DAILY, Disp: 90 tablet, Rfl: 3 .  carvedilol (COREG) 6.25 MG tablet, Take 1 tablet (6.25 mg total) by mouth 2 (two) times daily. (Patient taking differently: Take 1.25 mg by mouth 2 (two) times daily. ), Disp: 180 tablet, Rfl: 3 .  fluticasone (FLONASE) 50 MCG/ACT nasal spray, USE 2 SPRAY(S) IN EACH NOSTRIL ONCE DAILY, Disp: 16 g, Rfl: 11 .  hydrALAZINE (APRESOLINE) 25 MG tablet, Take 1 tablet (25 mg total) by mouth 3 (three) times daily., Disp: 270 tablet, Rfl: 3 .  hydrochlorothiazide (HYDRODIURIL) 25 MG tablet, TAKE 1 TABLET BY MOUTH ONCE DAILY, Disp: 90 tablet, Rfl: 3 .  hydrochlorothiazide (HYDRODIURIL) 25 MG tablet, TAKE 1 TABLET BY MOUTH ONCE DAILY, Disp: 90  tablet, Rfl: 3 .  levothyroxine (SYNTHROID, LEVOTHROID) 137 MCG tablet, TAKE ONE TABLET BY MOUTH ONCE DAILY, Disp: 90 tablet, Rfl: 4 .  losartan (COZAAR) 100 MG tablet, TAKE ONE TABLET BY MOUTH ONCE DAILY, Disp: 90 tablet, Rfl: 3 .  meclizine (ANTIVERT) 25 MG tablet, TAKE ONE TABLET BY MOUTH THREE TIMES DAILY AS NEEDED FOR DIZZINESS, Disp: 30 tablet, Rfl: 12 .  Melatonin 3 MG TBDP, Take 1 tablet by mouth at bedtime., Disp: , Rfl:  .  mirtazapine (REMERON) 15 MG tablet, Take 1 tablet (15 mg total) by mouth at bedtime., Disp: 90 tablet, Rfl: 3 .  Misc Natural Products (GLUCOSAMINE-CHONDROITIN PLUS PO), Take 1 tablet by mouth daily., Disp: , Rfl:  .  mometasone (ELOCON) 0.1 % cream, Apply 1 application topically daily., Disp: 45 g, Rfl: 0 .  Multiple Vitamin (MULTIVITAMIN) tablet, Take 1 tablet by mouth daily., Disp: , Rfl:  .  pantoprazole (PROTONIX) 40 MG tablet, Take 1 tablet (40 mg total) by mouth daily., Disp: 90 tablet, Rfl: 3 .  ranitidine (ZANTAC) 150 MG capsule, Take 1 capsule (150 mg total) by mouth 2 (two) times daily., Disp: 60 capsule, Rfl: 12 .  sertraline (ZOLOFT) 25 MG tablet, TAKE ONE TABLET BY MOUTH ONCE DAILY, Disp: 90 tablet, Rfl: 3 .  vitamin C (ASCORBIC ACID) 500 MG tablet,  Take 500 mg by mouth daily., Disp: , Rfl:  .  albuterol (PROVENTIL) (2.5 MG/3ML) 0.083% nebulizer solution, Take 2.5 mg by nebulization every 4 (four) hours as needed. , Disp: , Rfl:  .  amitriptyline (ELAVIL) 10 MG tablet, Take 1 tablet (10 mg total) by mouth at bedtime. (Patient not taking: Reported on 04/17/2018), Disp: 90 tablet, Rfl: 3 .  clopidogrel (PLAVIX) 75 MG tablet, TAKE 1 TABLET BY MOUTH ONCE DAILY (Patient not taking: Reported on 04/17/2018), Disp: 90 tablet, Rfl: 3 .  isosorbide mononitrate (IMDUR) 30 MG 24 hr tablet, Take 1 tablet (30 mg total) by mouth every morning., Disp: 30 tablet, Rfl: 12  Review of Systems  Constitutional: Negative.   HENT: Negative.   Eyes: Negative.   Respiratory:  Negative.   Cardiovascular: Negative for chest pain, palpitations and leg swelling.  Gastrointestinal: Positive for diarrhea and nausea. Negative for abdominal distention, abdominal pain, anal bleeding, blood in stool, constipation, rectal pain and vomiting.  Endocrine: Negative.   Genitourinary: Negative.   Musculoskeletal: Positive for arthralgias.  Allergic/Immunologic: Negative.   Neurological: Negative.   Psychiatric/Behavioral: Negative.     Social History   Tobacco Use  . Smoking status: Never Smoker  . Smokeless tobacco: Never Used  Substance Use Topics  . Alcohol use: No    Alcohol/week: 0.0 oz   Objective:   BP 122/60 (BP Location: Left Arm, Patient Position: Sitting, Cuff Size: Normal)   Pulse 64   Temp 98.6 F (37 C)   Resp 16   Wt 159 lb (72.1 kg)   SpO2 97%   BMI 28.17 kg/m  Vitals:   04/17/18 1146  BP: 122/60  Pulse: 64  Resp: 16  Temp: 98.6 F (37 C)  SpO2: 97%  Weight: 159 lb (72.1 kg)     Physical Exam  Constitutional: She is oriented to person, place, and time. She appears well-developed and well-nourished.  HENT:  Head: Normocephalic and atraumatic.  Right Ear: External ear normal.  Left Ear: External ear normal.  Nose: Nose normal.  Eyes: Conjunctivae are normal. No scleral icterus.  Neck: No thyromegaly present.  Cardiovascular: Normal rate, regular rhythm and normal heart sounds.  Pulmonary/Chest: Effort normal and breath sounds normal.  Abdominal: Soft.  Musculoskeletal: She exhibits no edema.  Neurological: She is alert and oriented to person, place, and time.  Skin: Skin is warm and dry.  Psychiatric: She has a normal mood and affect. Her behavior is normal. Judgment and thought content normal.        Assessment & Plan:     1. Hospital discharge follow-up   2. Recurrent major depressive disorder, in partial remission (HCC) Increase dose. - mirtazapine (REMERON) 30 MG tablet; Take 1 tablet (30 mg total) by mouth at bedtime.   Dispense: 90 tablet; Refill: 3 3.Diverticulitar bleed/Diverticulitis 4.Anemia 5.Onychomycosis of left Great nail 6.HTN 7.HLD      [name], MD  [facility]

## 2018-04-20 ENCOUNTER — Telehealth: Payer: Self-pay | Admitting: *Deleted

## 2018-04-20 NOTE — Telephone Encounter (Signed)
Patient wanted clarification on weather she is suppose to be taking clopidogrel or not? Please advise?

## 2018-04-21 NOTE — Telephone Encounter (Signed)
Stop for now

## 2018-04-21 NOTE — Telephone Encounter (Signed)
Left message to call back  

## 2018-04-21 NOTE — Telephone Encounter (Signed)
Is she supposed to stay on her Plavix? Thanks

## 2018-04-25 NOTE — Telephone Encounter (Signed)
Advised patient

## 2018-04-26 ENCOUNTER — Telehealth: Payer: Self-pay | Admitting: Family Medicine

## 2018-04-26 NOTE — Telephone Encounter (Signed)
Pt is requesting Stephanie Charles return her call asap. I tried to get more information and pt just keep saying that she needs to speak with Stephanie Charles right away. Please advise.  Thanks TNP

## 2018-04-27 ENCOUNTER — Ambulatory Visit: Payer: Self-pay | Admitting: Family Medicine

## 2018-04-27 NOTE — Telephone Encounter (Signed)
I spoke to pt 04/26/18 and made referral to [place] as she requested.Their office will contact her with appointment

## 2018-05-01 ENCOUNTER — Ambulatory Visit: Payer: Self-pay | Admitting: Gastroenterology

## 2018-05-18 ENCOUNTER — Ambulatory Visit: Payer: Self-pay | Admitting: Family Medicine

## 2018-05-22 ENCOUNTER — Other Ambulatory Visit: Payer: Self-pay

## 2018-05-22 ENCOUNTER — Encounter: Payer: Self-pay | Admitting: Gastroenterology

## 2018-05-22 ENCOUNTER — Ambulatory Visit: Payer: Medicare Other | Admitting: Gastroenterology

## 2018-05-22 VITALS — BP 114/56 | HR 68 | Ht 62.0 in | Wt 162.4 lb

## 2018-05-22 DIAGNOSIS — D500 Iron deficiency anemia secondary to blood loss (chronic): Secondary | ICD-10-CM

## 2018-05-22 DIAGNOSIS — K5732 Diverticulitis of large intestine without perforation or abscess without bleeding: Secondary | ICD-10-CM | POA: Diagnosis not present

## 2018-05-22 NOTE — Progress Notes (Signed)
Stephanie Charles [address]  [place]  [place], [place] [address]  Main: [phone]  Fax: [phone]   Gastroenterology Consultation  Referring Provider:     Jerrol Banana.,* Primary Care Physician:  Jerrol Banana., MD Primary Gastroenterologist:  Dr. Vonda Charles Reason for Consultation:     Diverticulitis, anemia        HPI:    Chief Complaint  Patient presents with  . New Patient (Initial Visit)    referral Dr. Rosanna Randy for +FOBT, ANEMIA,  Abdominal pain    Stephanie Charles is a 84 y.o. y/o female referred for consultation & management  by Dr. Rosanna Randy, Retia Passe., MD.  Patient was admitted to [facility] for diverticulitis on April 13, 2018.  Patient states she went in with sharp abdominal pain, that has resolved at this time.  Was treated with antibiotics that she has completed the course.  No nausea or vomiting, fever or chills.  Normal appetite.  No weight loss.  Denies seeing any bright red blood per rectum, but FOBT was positive as per primary care referral.  States when she had the episode of diverticulitis her stool was dark brown to dark black, but this has completely resolved since being treated with antibiotics.  States try to eat a high fiber diet as she has had previous episode of diverticulitis prior to that  Is on aspirin and Plavix Lab work shows a downtrending hemoglobin to 9.1 in June 2019, compared to 12.4 in February 2019 Patient states her primary care provider recently repeated the lab work, that I do not have the record of, but hemoglobin on the repeat lab work was around 10  Last colonoscopy, 2015 for hematochezia and by Dr. Allen Norris.  Moderate inflammation in the rectosigmoid colon reported and biopsied.  5 mm transverse colon polyp removed.  Diverticulosis seen.  Pathology showed hyperplastic polyp, and active colitis/ischemic proctitis.  2008 colonoscopy for abdominal pain with diverticulosis and internal hemorrhoids  seen 2004 colonoscopy for diarrhea, with diverticulosis seen otherwise normal. Past medical history: CAD, CVA, hypertension  Past Surgical History:  Procedure Laterality Date  . KNEE ARTHROSCOPY Right   . SHOULDER SURGERY Left     Prior to Admission medications   Medication Sig Start Date End Date Taking? Authorizing Provider  albuterol (PROVENTIL) (2.5 MG/3ML) 0.083% nebulizer solution Take 2.5 mg by nebulization every 4 (four) hours as needed.  11/13/17 11/13/18 Yes [provider]  allopurinol (ZYLOPRIM) 300 MG tablet TAKE ONE TABLET BY MOUTH ONCE DAILY 08/31/17  Yes Jerrol Banana., MD  amitriptyline (ELAVIL) 10 MG tablet Take 1 tablet (10 mg total) by mouth at bedtime. 01/12/18  Yes Jerrol Banana., MD  amLODipine (NORVASC) 5 MG tablet Take 1 tablet (5 mg total) by mouth daily. 12/14/17  Yes Jerrol Banana., MD  aspirin 81 MG tablet Take 81 mg by mouth daily.  10/26/11  Yes [provider]  atorvastatin (LIPITOR) 40 MG tablet TAKE ONE TABLET BY MOUTH ONCE DAILY 08/23/17  Yes Jerrol Banana., MD  carvedilol (COREG) 6.25 MG tablet Take 1 tablet (6.25 mg total) by mouth 2 (two) times daily. Patient taking differently: Take 1.25 mg by mouth 2 (two) times daily.  01/18/18  Yes Jerrol Banana., MD  clopidogrel (PLAVIX) 75 MG tablet TAKE 1 TABLET BY MOUTH ONCE DAILY 02/16/18  Yes Jerrol Banana., MD  fluticasone [facility]) 50 MCG/ACT nasal spray USE 2 SPRAY(S)  IN EACH NOSTRIL ONCE DAILY 03/29/18  Yes Jerrol Banana., MD  hydrALAZINE (APRESOLINE) 25 MG tablet Take 1 tablet (25 mg total) by mouth 3 (three) times daily. 06/24/17  Yes Jerrol Banana., MD  hydrochlorothiazide (HYDRODIURIL) 25 MG tablet TAKE 1 TABLET BY MOUTH ONCE DAILY 03/29/18  Yes Jerrol Banana., MD  isosorbide mononitrate (IMDUR) 30 MG 24 hr tablet Take 1 tablet (30 mg total) by mouth every morning. 02/14/17  Yes Jerrol Banana., MD  levothyroxine  (SYNTHROID, LEVOTHROID) 137 MCG tablet TAKE ONE TABLET BY MOUTH ONCE DAILY 11/10/17  Yes Jerrol Banana., MD  losartan (COZAAR) 100 MG tablet TAKE ONE TABLET BY MOUTH ONCE DAILY 08/08/17  Yes Jerrol Banana., MD  Melatonin 3 MG TBDP Take 1 tablet by mouth at bedtime.   Yes [provider]  mirtazapine (REMERON) 30 MG tablet Take 1 tablet (30 mg total) by mouth at bedtime. 04/17/18  Yes Jerrol Banana., MD  sertraline (ZOLOFT) 25 MG tablet TAKE ONE TABLET BY MOUTH ONCE DAILY 12/26/17  Yes Jerrol Banana., MD    Family History  Problem Relation Age of Onset  . Heart disease Mother   . Hypertension Mother   . Stroke Mother   . Heart disease Father   . Breast cancer Sister   . Alzheimer's disease Brother   . Heart attack Brother   . Stroke Brother   . Heart disease Brother   . Clotting disorder Daughter   . Mental illness Brother        died suicide  . Cancer Brother        prostate  . Heart disease Brother        atrial fib  . Alzheimer's disease Brother   . Fibromyalgia Daughter      Social History   Tobacco Use  . Smoking status: Never Smoker  . Smokeless tobacco: Never Used  Substance Use Topics  . Alcohol use: No    Alcohol/week: 0.0 standard drinks  . Drug use: No    Allergies as of 05/22/2018 - Review Complete 05/22/2018  Allergen Reaction Noted  . Levofloxacin  03/11/2015  . Lisinopril  06/14/2017  . Povidone-iodine Other (See Comments) 09/22/2017  . Simvastatin  03/11/2015  . Codeine Nausea And Vomiting 04/08/2014    Review of Systems:    All systems reviewed and negative except where noted in HPI.   Physical Exam:  BP (!) 114/56   Pulse 68   Ht 5\' 2"  (1.575 m)   Wt 162 lb 6.4 oz (73.7 kg)   BMI 29.70 kg/m  No LMP recorded. Patient is postmenopausal. Psych:  Alert and cooperative. Normal mood and affect. General:   Alert,  Well-developed, well-nourished, pleasant and cooperative in NAD Head:  Normocephalic and  atraumatic. Eyes:  Sclera clear, no icterus.   Conjunctiva pink. Ears:  Normal auditory acuity. Nose:  No deformity, discharge, or lesions. Mouth:  No deformity or lesions,oropharynx pink & moist. Neck:  Supple; no masses or thyromegaly. Lungs:  Respirations even and unlabored.  Clear throughout to auscultation.   No wheezes, crackles, or rhonchi. No acute distress. Heart:  Regular rate and rhythm; no murmurs, clicks, rubs, or gallops. Abdomen:  Normal bowel sounds.  No bruits.  Soft, non-tender and non-distended without masses, hepatosplenomegaly or hernias noted.  No guarding or rebound tenderness.    Msk:  Symmetrical without gross deformities. Good, equal movement & strength bilaterally. Pulses:  Normal  pulses noted. Extremities:  No clubbing or edema.  No cyanosis. Neurologic:  Alert and oriented x3;  grossly normal neurologically. Skin:  Intact without significant lesions or rashes. No jaundice. Lymph Nodes:  No significant cervical adenopathy. Psych:  Alert and cooperative. Normal mood and affect.   Labs: CBC    Component Value Date/Time   WBC 7.8 03/29/2018 1536   WBC 8.3 04/26/2014 1141   RBC 3.62 (L) 03/29/2018 1536   RBC 4.36 04/26/2014 1141   HGB 9.1 (L) 03/29/2018 1536   HCT 29.5 (L) 03/29/2018 1536   PLT 227 03/29/2018 1536   MCV 82 03/29/2018 1536   MCV 90 04/26/2014 1141   MCH 25.1 (L) 03/29/2018 1536   MCH 31.2 04/26/2014 1141   MCHC 30.8 (L) 03/29/2018 1536   MCHC 34.8 04/26/2014 1141   RDW 14.5 03/29/2018 1536   RDW 14.3 04/26/2014 1141   LYMPHSABS 2.0 03/29/2018 1536   LYMPHSABS 2.5 04/26/2014 1141   MONOABS 0.6 04/26/2014 1141   EOSABS 0.2 03/29/2018 1536   EOSABS 0.2 04/26/2014 1141   BASOSABS 0.0 03/29/2018 1536   BASOSABS 0.1 04/26/2014 1141   CMP     Component Value Date/Time   NA 138 03/22/2018 1154   NA 133 (L) 04/26/2014 1141   K 4.5 03/22/2018 1154   K 3.9 04/26/2014 1141   CL 96 03/22/2018 1154   CL 99 04/26/2014 1141   CO2 23  03/22/2018 1154   CO2 27 04/26/2014 1141   GLUCOSE 106 (H) 03/22/2018 1154   GLUCOSE 89 04/26/2014 1141   BUN 17 03/22/2018 1154   BUN 19 (H) 04/26/2014 1141   CREATININE 1.11 (H) 03/22/2018 1154   CREATININE 1.20 04/26/2014 1141   CALCIUM 9.4 03/22/2018 1154   CALCIUM 8.8 04/26/2014 1141   PROT 6.6 03/22/2018 1154   PROT 7.4 04/26/2014 1141   ALBUMIN 4.5 03/22/2018 1154   ALBUMIN 3.8 04/26/2014 1141   AST 18 03/22/2018 1154   AST 22 04/26/2014 1141   ALT 13 03/22/2018 1154   ALT 29 04/26/2014 1141   ALKPHOS 113 03/22/2018 1154   ALKPHOS 92 04/26/2014 1141   BILITOT 0.4 03/22/2018 1154   BILITOT 0.5 04/26/2014 1141   GFRNONAA 45 (L) 03/22/2018 1154   GFRNONAA 42 (L) 04/26/2014 1141   GFRAA 52 (L) 03/22/2018 1154   GFRAA 49 (L) 04/26/2014 1141    Imaging Studies: No results found.  Assessment and Plan:   Stephanie Charles is a 84 y.o. y/o female has been referred for diverticulitis on April 13 2018, and iron deficiency anemia  Colonoscopy and EGD indicated due to iron deficiency anemia This week will be 6 weeks since her episode of diverticulitis Symptoms of diverticulitis have resolved We will plan on scheduling procedures after this week We will need to obtain clearance for holding her Plavix as well I discussed hematology referral due to iron deficiency, but since her hemoglobin has improved to around 10 as per patient, on her recent blood work done by Dr. Rosanna Randy (result of that recent blood work not available), she would like to hold off on this referral at this time which is reasonable  I have asked the patient to contact us if she develops abdominal pain, blood in stool, or any other reason for concern.  She verbalized understanding.   Dr Stephanie Charles

## 2018-05-24 ENCOUNTER — Other Ambulatory Visit: Payer: Self-pay | Admitting: Family Medicine

## 2018-05-24 DIAGNOSIS — G4700 Insomnia, unspecified: Secondary | ICD-10-CM

## 2018-05-25 ENCOUNTER — Ambulatory Visit (INDEPENDENT_AMBULATORY_CARE_PROVIDER_SITE_OTHER): Payer: Medicare Other | Admitting: Family Medicine

## 2018-05-25 ENCOUNTER — Encounter: Payer: Self-pay | Admitting: Family Medicine

## 2018-05-25 VITALS — BP 138/64 | HR 67 | Temp 98.3°F | Wt 163.0 lb

## 2018-05-25 DIAGNOSIS — I10 Essential (primary) hypertension: Secondary | ICD-10-CM

## 2018-05-25 DIAGNOSIS — M25471 Effusion, right ankle: Secondary | ICD-10-CM

## 2018-05-25 DIAGNOSIS — D500 Iron deficiency anemia secondary to blood loss (chronic): Secondary | ICD-10-CM

## 2018-05-25 DIAGNOSIS — F419 Anxiety disorder, unspecified: Secondary | ICD-10-CM

## 2018-05-25 DIAGNOSIS — M25472 Effusion, left ankle: Secondary | ICD-10-CM

## 2018-05-25 NOTE — Progress Notes (Signed)
Patient: Stephanie Charles Female    DOB: Aug 10, 1930   84 y.o.   MRN: [id number] Visit Date: 05/25/2018  Today's Provider: [name], MD   Chief Complaint  Patient presents with  . Anxiety   Subjective:    Anxiety  Presents for follow-up (Pt comes in today for a follow up on anxiety.  She increased her remeron about a month ago.  She reports an improvement since then.  ) visit. Patient reports no chest pain, confusion, decreased concentration, dizziness, nausea, nervous/anxious behavior, palpitations or suicidal ideas.         Allergies  Allergen Reactions  . Levofloxacin     Mouth sores  . Lisinopril     Cough?  . Povidone-Iodine Other (See Comments)    Severe blistering and itchiness. Severe redness  . Simvastatin     Myalgias  . Codeine Nausea And Vomiting     Current Outpatient Medications:  .  albuterol (PROVENTIL) (2.5 MG/3ML) 0.083% nebulizer solution, Take 2.5 mg by nebulization every 4 (four) hours as needed. , Disp: , Rfl:  .  allopurinol (ZYLOPRIM) 300 MG tablet, TAKE ONE TABLET BY MOUTH ONCE DAILY, Disp: 90 tablet, Rfl: 3 .  amitriptyline (ELAVIL) 10 MG tablet, Take 1 tablet (10 mg total) by mouth at bedtime., Disp: 90 tablet, Rfl: 3 .  amLODipine (NORVASC) 5 MG tablet, Take 1 tablet (5 mg total) by mouth daily., Disp: 30 tablet, Rfl: 12 .  aspirin 81 MG tablet, Take 81 mg by mouth daily. , Disp: , Rfl:  .  atorvastatin (LIPITOR) 40 MG tablet, TAKE ONE TABLET BY MOUTH ONCE DAILY, Disp: 90 tablet, Rfl: 3 .  carvedilol (COREG) 6.25 MG tablet, Take 1 tablet (6.25 mg total) by mouth 2 (two) times daily. (Patient taking differently: Take 1.25 mg by mouth 2 (two) times daily. ), Disp: 180 tablet, Rfl: 3 .  clopidogrel (PLAVIX) 75 MG tablet, TAKE 1 TABLET BY MOUTH ONCE DAILY, Disp: 90 tablet, Rfl: 3 .  fluticasone (FLONASE) 50 MCG/ACT nasal spray, USE 2 SPRAY(S) IN EACH NOSTRIL ONCE DAILY, Disp: 16 g, Rfl: 11 .  hydrALAZINE (APRESOLINE) 25 MG tablet,  Take 1 tablet (25 mg total) by mouth 3 (three) times daily., Disp: 270 tablet, Rfl: 3 .  hydrochlorothiazide (HYDRODIURIL) 25 MG tablet, TAKE 1 TABLET BY MOUTH ONCE DAILY, Disp: 90 tablet, Rfl: 3 .  isosorbide mononitrate (IMDUR) 30 MG 24 hr tablet, Take 1 tablet (30 mg total) by mouth every morning., Disp: 30 tablet, Rfl: 12 .  levothyroxine (SYNTHROID, LEVOTHROID) 137 MCG tablet, TAKE ONE TABLET BY MOUTH ONCE DAILY, Disp: 90 tablet, Rfl: 4 .  losartan (COZAAR) 100 MG tablet, TAKE ONE TABLET BY MOUTH ONCE DAILY, Disp: 90 tablet, Rfl: 3 .  Melatonin 3 MG TBDP, Take 1 tablet by mouth at bedtime., Disp: , Rfl:  .  mirtazapine (REMERON) 30 MG tablet, Take 1 tablet (30 mg total) by mouth at bedtime., Disp: 90 tablet, Rfl: 3 .  sertraline (ZOLOFT) 25 MG tablet, TAKE ONE TABLET BY MOUTH ONCE DAILY, Disp: 90 tablet, Rfl: 3 .  amitriptyline (ELAVIL) 10 MG tablet, TAKE 2 TABLETS BY MOUTH AT BEDTIME, Disp: 60 tablet, Rfl: 12  Review of Systems  Constitutional: Negative.   HENT: Negative.   Eyes: Negative.   Respiratory: Negative.   Cardiovascular: Positive for leg swelling (Right ankle is swelling). Negative for chest pain and palpitations.  Gastrointestinal: Positive for diarrhea. Negative for abdominal distention, abdominal pain, anal bleeding,  blood in stool, constipation, nausea, rectal pain and vomiting.  Endocrine: Negative.   Allergic/Immunologic: Negative.   Neurological: Negative for dizziness, light-headedness and headaches.  Hematological: Does not bruise/bleed easily.  Psychiatric/Behavioral: Positive for sleep disturbance. Negative for agitation, behavioral problems, confusion, decreased concentration, dysphoric mood, hallucinations, self-injury and suicidal ideas. The patient is not nervous/anxious and is not hyperactive.     Social History   Tobacco Use  . Smoking status: Never Smoker  . Smokeless tobacco: Never Used  Substance Use Topics  . Alcohol use: No    Alcohol/week: 0.0  standard drinks   Objective:   BP 138/64 (BP Location: Left Arm, Patient Position: Sitting, Cuff Size: Large)   Pulse 67   Temp 98.3 F (36.8 C) (Oral)   Wt 163 lb (73.9 kg)   SpO2 94%   BMI 29.81 kg/m  Vitals:   05/25/18 1426  BP: 138/64  Pulse: 67  Temp: 98.3 F (36.8 C)  TempSrc: Oral  SpO2: 94%  Weight: 163 lb (73.9 kg)     Physical Exam  Constitutional: She is oriented to person, place, and time. She appears well-developed and well-nourished.  HENT:  Head: Normocephalic and atraumatic.  Right Ear: External ear normal.  Left Ear: External ear normal.  Nose: Nose normal.  Eyes: Conjunctivae are normal. No scleral icterus.  Neck: No thyromegaly present.  Cardiovascular: Normal rate, regular rhythm and normal heart sounds.  Pulmonary/Chest: Effort normal and breath sounds normal.  Abdominal: Soft.  Musculoskeletal: She exhibits no edema.  Neurological: She is alert and oriented to person, place, and time.  Skin: Skin is warm and dry.  Psychiatric: She has a normal mood and affect. Her behavior is normal. Judgment and thought content normal.        Assessment & Plan:     Anxiety Improved HTN RTC 3 months. Ankle/pedal edema Due to Amlodipine. Iron def Anemia     I have done the exam and reviewed the above chart and it is accurate to the best of my knowledge. Development worker, community has been used in this note in any air is in the dictation or transcription are unintentional.  [name], MD  [place]

## 2018-05-29 ENCOUNTER — Other Ambulatory Visit: Payer: Self-pay | Admitting: Family Medicine

## 2018-05-29 MED ORDER — CARVEDILOL 6.25 MG PO TABS: 6.2500 mg | ORAL_TABLET | Freq: Two times a day (BID) | ORAL | 3 refills | Status: DC

## 2018-05-29 NOTE — Telephone Encounter (Signed)
Ok thx.

## 2018-05-29 NOTE — Telephone Encounter (Signed)
Sent into the pharmacy.  

## 2018-05-29 NOTE — Telephone Encounter (Signed)
Pt contacted office for refill request on the following medications:  carvedilol (COREG) 6.25 MG tablet  [facility] [address]  Pt stated that the pharmacy advised pt she couldn't get the medication at this time because the Rx that was sent 01/18/18 has different directions. Pt stated that she is taking a whole tablet twice a day and needs a new Rx sent to [facility]. Please advise. Thanks TNP

## 2018-05-29 NOTE — Telephone Encounter (Signed)
Ok to refill taking a whole tablet twice daily? Please advise. Thanks!

## 2018-05-30 ENCOUNTER — Ambulatory Visit: Payer: Self-pay | Admitting: Family Medicine

## 2018-06-06 ENCOUNTER — Encounter: Payer: Self-pay | Admitting: *Deleted

## 2018-06-06 ENCOUNTER — Other Ambulatory Visit: Payer: Self-pay

## 2018-06-06 DIAGNOSIS — D509 Iron deficiency anemia, unspecified: Secondary | ICD-10-CM | POA: Diagnosis not present

## 2018-06-06 DIAGNOSIS — M25471 Effusion, right ankle: Secondary | ICD-10-CM | POA: Diagnosis not present

## 2018-06-06 DIAGNOSIS — G459 Transient cerebral ischemic attack, unspecified: Secondary | ICD-10-CM | POA: Diagnosis not present

## 2018-06-06 DIAGNOSIS — I10 Essential (primary) hypertension: Secondary | ICD-10-CM | POA: Diagnosis not present

## 2018-06-07 ENCOUNTER — Encounter: Payer: Self-pay | Admitting: Anesthesiology

## 2018-06-07 ENCOUNTER — Telehealth: Payer: Self-pay

## 2018-06-07 NOTE — Telephone Encounter (Signed)
Pt was called to let her know to stop her Plavix 3 days prior to procedure and restart 2 days after procedure.  Pt has rescheduled to 9/13 due to her daughter not able to be here on the 10th.

## 2018-06-08 ENCOUNTER — Telehealth: Payer: Self-pay

## 2018-06-08 NOTE — Discharge Instructions (Signed)
General Anesthesia, Adult, Care After °These instructions provide you with information about caring for yourself after your procedure. Your health care provider may also give you more specific instructions. Your treatment has been planned according to current medical practices, but problems sometimes occur. Call your health care provider if you have any problems or questions after your procedure. °What can I expect after the procedure? °After the procedure, it is common to have: °· Vomiting. °· A sore throat. °· Mental slowness. ° °It is common to feel: °· Nauseous. °· Cold or shivery. °· Sleepy. °· Tired. °· Sore or achy, even in parts of your body where you did not have surgery. ° °Follow these instructions at home: °For at least 24 hours after the procedure: °· Do not: °? Participate in activities where you could fall or become injured. °? Drive. °? Use heavy machinery. °? Drink alcohol. °? Take sleeping pills or medicines that cause drowsiness. °? Make important decisions or sign legal documents. °? Take care of children on your own. °· Rest. °Eating and drinking °· If you vomit, drink water, juice, or soup when you can drink without vomiting. °· Drink enough fluid to keep your urine clear or pale yellow. °· Make sure you have little or no nausea before eating solid foods. °· Follow the diet recommended by your health care provider. °General instructions °· Have a responsible adult stay with you until you are awake and alert. °· Return to your normal activities as told by your health care provider. Ask your health care provider what activities are safe for you. °· Take over-the-counter and prescription medicines only as told by your health care provider. °· If you smoke, do not smoke without supervision. °· Keep all follow-up visits as told by your health care provider. This is important. °Contact a health care provider if: °· You continue to have nausea or vomiting at home, and medicines are not helpful. °· You  cannot drink fluids or start eating again. °· You cannot urinate after 8-12 hours. °· You develop a skin rash. °· You have fever. °· You have increasing redness at the site of your procedure. °Get help right away if: °· You have difficulty breathing. °· You have chest pain. °· You have unexpected bleeding. °· You feel that you are having a life-threatening or urgent problem. °This information is not intended to replace advice given to you by your health care provider. Make sure you discuss any questions you have with your health care provider. °Document Released: 12/27/2000 Document Revised: 02/23/2016 Document Reviewed: 09/04/2015 °Elsevier Interactive Patient Education © 2018 [employer]vier Inc. ° °

## 2018-06-08 NOTE — Telephone Encounter (Signed)
[place] states we need to reschedule due to time frame of procedures with other procedures and anesthesia. She agreed to 9/20. I informed pt that I would verify this tomorrow with [facility] so we can avoid rescheduling again.

## 2018-06-09 ENCOUNTER — Other Ambulatory Visit: Payer: Self-pay

## 2018-06-09 DIAGNOSIS — D500 Iron deficiency anemia secondary to blood loss (chronic): Secondary | ICD-10-CM

## 2018-06-09 NOTE — Telephone Encounter (Signed)
I contacted pt again unfortuately having to move the date to 9/19 for here procedures due to availability. It was set yesterday for 9/25 (was unsure if it could be done that day) and it could not. Pt's family mentioned to her that she should go to [facility] instead since this is taking so long. I tried to get pt in asap. I informed pt that if this was not what she and her family wanted to let us know and to cancel to procedure.

## 2018-06-13 ENCOUNTER — Ambulatory Visit: Admission: RE | Admit: 2018-06-13 | Payer: Medicare Other | Source: Ambulatory Visit | Admitting: Gastroenterology

## 2018-06-13 HISTORY — DX: Presence of external hearing-aid: Z97.4

## 2018-06-13 HISTORY — DX: Transient cerebral ischemic attack, unspecified: G45.9

## 2018-06-13 HISTORY — DX: Anemia, unspecified: D64.9

## 2018-06-13 HISTORY — DX: Hypothyroidism, unspecified: E03.9

## 2018-06-13 HISTORY — DX: Hyperlipidemia, unspecified: E78.5

## 2018-06-13 HISTORY — DX: Dizziness and giddiness: R42

## 2018-06-13 HISTORY — DX: Acute myocardial infarction, unspecified: I21.9

## 2018-06-13 HISTORY — DX: Diverticulitis of intestine, part unspecified, without perforation or abscess without bleeding: K57.92

## 2018-06-13 HISTORY — DX: Essential (primary) hypertension: I10

## 2018-06-13 HISTORY — DX: Gastro-esophageal reflux disease without esophagitis: K21.9

## 2018-06-13 SURGERY — COLONOSCOPY WITH PROPOFOL
Anesthesia: Choice

## 2018-06-20 ENCOUNTER — Telehealth: Payer: Self-pay | Admitting: Gastroenterology

## 2018-06-20 NOTE — Telephone Encounter (Signed)
ERROR

## 2018-06-22 ENCOUNTER — Ambulatory Visit: Payer: Medicare Other | Admitting: Anesthesiology

## 2018-06-22 ENCOUNTER — Encounter
Admission: RE | Disposition: A | Discharge status: Home / Self Care | Payer: Self-pay | Source: Ambulatory Visit | Attending: Gastroenterology

## 2018-06-22 ENCOUNTER — Encounter: Payer: Self-pay | Admitting: Anesthesiology

## 2018-06-22 ENCOUNTER — Ambulatory Visit
Admission: RE | Admit: 2018-06-22 | Discharge: 2018-06-22 | Disposition: A | Discharge status: Home / Self Care | Payer: Medicare Other | Source: Ambulatory Visit | Attending: Gastroenterology | Admitting: Gastroenterology

## 2018-06-22 DIAGNOSIS — I252 Old myocardial infarction: Secondary | ICD-10-CM | POA: Diagnosis not present

## 2018-06-22 DIAGNOSIS — K31819 Angiodysplasia of stomach and duodenum without bleeding: Secondary | ICD-10-CM | POA: Insufficient documentation

## 2018-06-22 DIAGNOSIS — I10 Essential (primary) hypertension: Secondary | ICD-10-CM | POA: Diagnosis not present

## 2018-06-22 DIAGNOSIS — K219 Gastro-esophageal reflux disease without esophagitis: Secondary | ICD-10-CM | POA: Diagnosis not present

## 2018-06-22 DIAGNOSIS — E039 Hypothyroidism, unspecified: Secondary | ICD-10-CM | POA: Diagnosis not present

## 2018-06-22 DIAGNOSIS — D500 Iron deficiency anemia secondary to blood loss (chronic): Secondary | ICD-10-CM

## 2018-06-22 DIAGNOSIS — E785 Hyperlipidemia, unspecified: Secondary | ICD-10-CM | POA: Diagnosis not present

## 2018-06-22 DIAGNOSIS — K2980 Duodenitis without bleeding: Secondary | ICD-10-CM | POA: Insufficient documentation

## 2018-06-22 DIAGNOSIS — D509 Iron deficiency anemia, unspecified: Secondary | ICD-10-CM | POA: Insufficient documentation

## 2018-06-22 DIAGNOSIS — Z79899 Other long term (current) drug therapy: Secondary | ICD-10-CM | POA: Diagnosis not present

## 2018-06-22 DIAGNOSIS — Z7982 Long term (current) use of aspirin: Secondary | ICD-10-CM | POA: Diagnosis not present

## 2018-06-22 DIAGNOSIS — Z79890 Hormone replacement therapy (postmenopausal): Secondary | ICD-10-CM | POA: Insufficient documentation

## 2018-06-22 DIAGNOSIS — K5730 Diverticulosis of large intestine without perforation or abscess without bleeding: Secondary | ICD-10-CM | POA: Diagnosis not present

## 2018-06-22 DIAGNOSIS — Z7902 Long term (current) use of antithrombotics/antiplatelets: Secondary | ICD-10-CM | POA: Diagnosis not present

## 2018-06-22 DIAGNOSIS — Z8673 Personal history of transient ischemic attack (TIA), and cerebral infarction without residual deficits: Secondary | ICD-10-CM | POA: Insufficient documentation

## 2018-06-22 DIAGNOSIS — K5790 Diverticulosis of intestine, part unspecified, without perforation or abscess without bleeding: Secondary | ICD-10-CM | POA: Diagnosis not present

## 2018-06-22 HISTORY — PX: ESOPHAGOGASTRODUODENOSCOPY (EGD) WITH PROPOFOL: SHX5813

## 2018-06-22 HISTORY — PX: COLONOSCOPY WITH PROPOFOL: SHX5780

## 2018-06-22 SURGERY — COLONOSCOPY WITH PROPOFOL
Anesthesia: General

## 2018-06-22 MED ORDER — PROPOFOL 500 MG/50ML IV EMUL
INTRAVENOUS | Status: DC | PRN
  Administered 2018-06-22: 100 ug/kg/min via INTRAVENOUS

## 2018-06-22 MED ORDER — PROPOFOL 500 MG/50ML IV EMUL
INTRAVENOUS | Status: AC
  Filled 2018-06-22: qty 50

## 2018-06-22 MED ORDER — SODIUM CHLORIDE 0.9 % IV SOLN
INTRAVENOUS | Status: DC
  Administered 2018-06-22: 1000 mL via INTRAVENOUS

## 2018-06-22 MED ORDER — LIDOCAINE HCL (PF) 2 % IJ SOLN
INTRAMUSCULAR | Status: AC
  Filled 2018-06-22: qty 10

## 2018-06-22 MED ORDER — FENTANYL CITRATE (PF) 100 MCG/2ML IJ SOLN
INTRAMUSCULAR | Status: AC
  Filled 2018-06-22: qty 2

## 2018-06-22 MED ORDER — LIDOCAINE HCL (CARDIAC) PF 100 MG/5ML IV SOSY
PREFILLED_SYRINGE | INTRAVENOUS | Status: DC | PRN
  Administered 2018-06-22: 30 mg via INTRAVENOUS

## 2018-06-22 MED ORDER — FENTANYL CITRATE (PF) 100 MCG/2ML IJ SOLN
INTRAMUSCULAR | Status: DC | PRN
  Administered 2018-06-22: 25 ug via INTRAVENOUS

## 2018-06-22 NOTE — Transfer of Care (Signed)
Immediate Anesthesia Transfer of Care Note  Patient: Stephanie Charles  Procedure(s) Performed: COLONOSCOPY WITH PROPOFOL (N/A ) ESOPHAGOGASTRODUODENOSCOPY (EGD) WITH PROPOFOL (N/A )  Patient Location: PACU  Anesthesia Type:General  Level of Consciousness: awake and sedated  Airway & Oxygen Therapy: Patient Spontanous Breathing and Patient connected to nasal cannula oxygen  Post-op Assessment: Report given to RN and Post -op Vital signs reviewed and stable  Post vital signs: Reviewed and stable  Last Vitals:  Vitals Value Taken Time  BP    Temp    Pulse    Resp    SpO2      Last Pain:  Vitals:   06/22/18 0943  TempSrc: Tympanic  PainSc: 0-No pain         Complications: No apparent anesthesia complications

## 2018-06-22 NOTE — H&P (Signed)
Jonathon Bellows, MD [address] [place], [place], [place], [address] [address], [place], [place], [place], [address] Phone: [phone]  Fax: [phone]  Primary Care Physician:  Jerrol Banana., MD   Pre-Procedure History & Physical: HPI:  Stephanie Charles is a 84 y.o. female is here for an endoscopy and colonoscopy    Past Medical History:  Diagnosis Date  . Anemia   . Diverticulitis   . GERD (gastroesophageal reflux disease)   . Hyperlipidemia   . Hypertension   . Hypothyroidism   . Myocardial infarction ([place]) 08/2017  . TIA (transient ischemic attack)    1 approx 2015, 1 approx 2018  . Vertigo    last episode several months ago  . [name]     Past Surgical History:  Procedure Laterality Date  . KNEE ARTHROSCOPY Right   . REPLACEMENT TOTAL KNEE BILATERAL    . SHOULDER SURGERY Left     Prior to Admission medications   Medication Sig Start Date End Date Taking? Authorizing Provider  allopurinol (ZYLOPRIM) 300 MG tablet TAKE ONE TABLET BY MOUTH ONCE DAILY 08/31/17  Yes Jerrol Banana., MD  amitriptyline (ELAVIL) 10 MG tablet Take 1 tablet (10 mg total) by mouth at bedtime. 01/12/18  Yes Jerrol Banana., MD  amitriptyline (ELAVIL) 10 MG tablet TAKE 2 TABLETS BY MOUTH AT BEDTIME 05/24/18  Yes Jerrol Banana., MD  amLODipine (NORVASC) 5 MG tablet Take 1 tablet (5 mg total) by mouth daily. 12/14/17  Yes Jerrol Banana., MD  aspirin 81 MG tablet Take 81 mg by mouth daily.  10/26/11  Yes [provider]  atorvastatin (LIPITOR) 40 MG tablet TAKE ONE TABLET BY MOUTH ONCE DAILY 08/23/17  Yes Jerrol Banana., MD  carvedilol (COREG) 6.25 MG tablet Take 1 tablet (6.25 mg total) by mouth 2 (two) times daily. 05/29/18  Yes Jerrol Banana., MD  clopidogrel (PLAVIX) 75 MG tablet TAKE 1 TABLET BY MOUTH ONCE DAILY 02/16/18  Yes Jerrol Banana., MD  fluticasone [facility]) 50 MCG/ACT nasal  spray USE 2 SPRAY(S) IN EACH NOSTRIL ONCE DAILY 03/29/18  Yes Jerrol Banana., MD  hydrALAZINE (APRESOLINE) 25 MG tablet Take 1 tablet (25 mg total) by mouth 3 (three) times daily. 06/24/17  Yes Jerrol Banana., MD  hydrochlorothiazide (HYDRODIURIL) 25 MG tablet TAKE 1 TABLET BY MOUTH ONCE DAILY 03/29/18  Yes Jerrol Banana., MD  isosorbide mononitrate (IMDUR) 30 MG 24 hr tablet Take 1 tablet (30 mg total) by mouth every morning. 02/14/17  Yes Jerrol Banana., MD  levothyroxine (SYNTHROID, LEVOTHROID) 137 MCG tablet TAKE ONE TABLET BY MOUTH ONCE DAILY 11/10/17  Yes Jerrol Banana., MD  losartan (COZAAR) 100 MG tablet TAKE ONE TABLET BY MOUTH ONCE DAILY 08/08/17  Yes Jerrol Banana., MD  Melatonin 3 MG TBDP Take 1 tablet by mouth at bedtime.   Yes [provider]  Multiple Vitamin (MULTIVITAMIN) capsule Take 1 capsule by mouth daily.   Yes [provider]  sertraline (ZOLOFT) 25 MG tablet TAKE ONE TABLET BY MOUTH ONCE DAILY 12/26/17  Yes Jerrol Banana., MD  albuterol (PROVENTIL) (2.5 MG/3ML) 0.083% nebulizer solution Take 2.5 mg by nebulization every 4 (four) hours as needed.  11/13/17 11/13/18  [provider]  mirtazapine (REMERON) 30 MG tablet Take 1 tablet (30 mg total)  by mouth at bedtime. 04/17/18   Jerrol Banana., MD    Allergies as of 06/09/2018 - Review Complete 06/06/2018  Allergen Reaction Noted  . Levofloxacin  03/11/2015  . Lisinopril  06/14/2017  . Povidone-iodine Other (See Comments) 09/22/2017  . Simvastatin  03/11/2015  . Codeine Nausea And Vomiting 04/08/2014    Family History  Problem Relation Age of Onset  . Heart disease Mother   . Hypertension Mother   . Stroke Mother   . Heart disease Father   . Breast cancer Sister   . Alzheimer's disease Brother   . Heart attack Brother   . Stroke Brother   . Heart disease Brother   . Clotting disorder Daughter   . Mental illness Brother        died  suicide  . Cancer Brother        prostate  . Heart disease Brother        atrial fib  . Alzheimer's disease Brother   . Fibromyalgia Daughter     Social History   Socioeconomic History  . Marital status: Divorced    Spouse name: na  . Number of children: 4  . Years of education: 38  . Highest education level: Associate degree: occupational, Hotel manager, or vocational program  Occupational History  . Occupation: retired  Scientific laboratory technician  . Financial resource strain: Not hard at all  . Food insecurity:    Worry: Never true    Inability: Never true  . Transportation needs:    Medical: No    Non-medical: No  Tobacco Use  . Smoking status: Never Smoker  . Smokeless tobacco: Never Used  Substance and Sexual Activity  . Alcohol use: No    Alcohol/week: 0.0 standard drinks  . Drug use: No  . Sexual activity: Never  Lifestyle  . Physical activity:    Days per week: Not on file    Minutes per session: Not on file  . Stress: To some extent  Relationships  . Social connections:    Talks on phone: Not on file    Gets together: Not on file    Attends religious service: Not on file    Active member of club or organization: Not on file    Attends meetings of clubs or organizations: Not on file    Relationship status: Not on file  . Intimate partner violence:    Fear of current or ex partner: Not on file    Emotionally abused: Not on file    Physically abused: Not on file    Forced sexual activity: Not on file  Other Topics Concern  . Not on file  Social History Narrative  . Not on file    Review of Systems: See HPI, otherwise negative ROS  Physical Exam: BP (!) 179/76   Pulse 82   Temp (!) 96.4 F (35.8 C) (Tympanic)   Ht 5\' 3"  (1.6 m)   Wt 72.6 kg   SpO2 98%   BMI 28.34 kg/m  General:   Alert,  pleasant and cooperative in NAD Head:  Normocephalic and atraumatic. Neck:  Supple; no masses or thyromegaly. Lungs:  Clear throughout to auscultation, normal respiratory  effort.    Heart:  +S1, +S2, Regular rate and rhythm, No edema. Abdomen:  Soft, nontender and nondistended. Normal bowel sounds, without guarding, and without rebound.   Neurologic:  Alert and  oriented x4;  grossly normal neurologically.  Impression/Plan: Stephanie Charles is here for an endoscopy and  colonoscopy  to be performed for  evaluation of iron deficiency anemia    Risks, benefits, limitations, and alternatives regarding endoscopy have been reviewed with the patient.  Questions have been answered.  All parties agreeable.   Jonathon Bellows, MD  06/22/2018, 11:10 AM

## 2018-06-22 NOTE — Anesthesia Postprocedure Evaluation (Signed)
Anesthesia Post Note  Patient: Stephanie Charles  Procedure(s) Performed: COLONOSCOPY WITH PROPOFOL (N/A ) ESOPHAGOGASTRODUODENOSCOPY (EGD) WITH PROPOFOL (N/A )  Patient location during evaluation: Endoscopy Anesthesia Type: General Level of consciousness: awake and alert Pain management: pain level controlled Vital Signs Assessment: post-procedure vital signs reviewed and stable Respiratory status: spontaneous breathing and respiratory function stable Cardiovascular status: stable Anesthetic complications: no     Last Vitals:  Vitals:   06/22/18 0943 06/22/18 1202  BP: (!) 179/76 (!) 117/44  Pulse: 82 79  Resp:  17  Temp: (!) 35.8 C   SpO2: 98% 98%    Last Pain:  Vitals:   06/22/18 1202  TempSrc:   PainSc: 0-No pain                 KEPHART,WILLIAM K

## 2018-06-22 NOTE — Anesthesia Post-op Follow-up Note (Signed)
Anesthesia QCDR form completed.        

## 2018-06-22 NOTE — Op Note (Signed)
[facility] Gastroenterology Patient Name: Stephanie Charles Procedure Date: 06/22/2018 11:15 AM MRN: [id number] Account #: [id number] Date of Birth: Oct 08, 1929 Admit Type: Outpatient Age: 84 Room: [facility] ENDO ROOM 2 Gender: Female Note Status: Finalized Procedure:            Colonoscopy Indications:          Iron deficiency anemia Providers:            Jonathon Bellows MD, MD Medicines:            Monitored Anesthesia Care Complications:        No immediate complications. Procedure:            Pre-Anesthesia Assessment:                       - Prior to the procedure, a History and Physical was                        performed, and patient medications, allergies and                        sensitivities were reviewed. The patient's tolerance of                        previous anesthesia was reviewed.                       - The risks and benefits of the procedure and the                        sedation options and risks were discussed with the                        patient. All questions were answered and informed                        consent was obtained.                       - ASA Grade Assessment: III - A patient with severe                        systemic disease.                       After obtaining informed consent, the colonoscope was                        passed under direct vision. Throughout the procedure,                        the patient's blood pressure, pulse, and oxygen                        saturations were monitored continuously. The                        Colonoscope was introduced through the anus and                        advanced to the the cecum, identified by the  appendiceal orifice, IC valve and transillumination.                        The patient tolerated the procedure well. The                        colonoscopy was performed with moderate difficulty due                        to restricted mobility of the colon.  Successful                        completion of the procedure was aided by withdrawing                        the scope and replacing with the pediatric colonoscope.                        The patient tolerated the procedure well. The quality                        of the bowel preparation was good. Findings:      The perianal and digital rectal examinations were normal.      Multiple large-mouthed diverticula were found in the sigmoid colon.      The exam was otherwise without abnormality on direct and retroflexion       views. Impression:           - Diverticulosis in the sigmoid colon.                       - The examination was otherwise normal on direct and                        retroflexion views.                       - No specimens collected. Recommendation:       - Discharge patient to home (with escort).                       - Resume previous diet.                       - Continue present medications.                       - Return to GI office in 2 weeks.                       - suggest capsule study of the small bowel Procedure Code(s):    --- Professional ---                       [phone], Colonoscopy, flexible; diagnostic, including                        collection of specimen(s) by brushing or washing, when                        performed (separate procedure) Diagnosis Code(s):    --- Professional ---  D50.9, Iron deficiency anemia, unspecified                       K57.30, Diverticulosis of large intestine without                        perforation or abscess without bleeding CPT copyright 2017 American Medical Association. All rights reserved. The codes documented in this report are preliminary and upon coder review may  be revised to meet current compliance requirements. Jonathon Bellows, MD Jonathon Bellows MD, MD 06/22/2018 12:00:04 PM This report has been signed electronically. Number of Addenda: 0 Note Initiated On: 06/22/2018 11:15 AM Scope  Withdrawal Time: 0 hours 8 minutes 42 seconds  Total Procedure Duration: 0 hours 20 minutes 41 seconds       [facility]

## 2018-06-22 NOTE — Anesthesia Preprocedure Evaluation (Signed)
Anesthesia Evaluation  Patient identified by MRN, date of birth, ID band Patient awake    Reviewed: Allergy & Precautions, NPO status , Patient's Chart, lab work & pertinent test results  History of Anesthesia Complications Negative for: history of anesthetic complications  Airway Mallampati: III       Dental  (+) Upper Dentures, Lower Dentures   Pulmonary neg sleep apnea, neg COPD,           Cardiovascular hypertension, + Past MI (no stents, medical tx)  (-) CHF (-) dysrhythmias (-) Valvular Problems/Murmurs     Neuro/Psych neg Seizures Depression TIA (Balance problems, resolved)   GI/Hepatic Neg liver ROS, GERD  Medicated and Controlled,  Endo/Other  neg diabetesHypothyroidism   Renal/GU negative Renal ROS     Musculoskeletal   Abdominal   Peds  Hematology   Anesthesia Other Findings   Reproductive/Obstetrics                             Anesthesia Physical Anesthesia Plan  ASA: III  Anesthesia Plan: General   Post-op Pain Management:    Induction: Intravenous  PONV Risk Score and Plan: 3 and Propofol infusion, TIVA and Treatment may vary due to age or medical condition  Airway Management Planned: Nasal Cannula  Additional Equipment:   Intra-op Plan:   Post-operative Plan:   Informed Consent: I have reviewed the patients History and Physical, chart, labs and discussed the procedure including the risks, benefits and alternatives for the proposed anesthesia with the patient or authorized representative who has indicated his/her understanding and acceptance.     Plan Discussed with:   Anesthesia Plan Comments:         Anesthesia Quick Evaluation

## 2018-06-22 NOTE — Op Note (Signed)
[facility] Gastroenterology Patient Name: Stephanie Charles Procedure Date: 06/22/2018 11:14 AM MRN: [id number] Account #: [id number] Date of Birth: 02-09-1930 Admit Type: Outpatient Age: 84 Room: [facility] ENDO ROOM 2 Gender: Female Note Status: Finalized Procedure:            Upper GI endoscopy Indications:          Iron deficiency anemia Providers:            Jonathon Bellows MD, MD Medicines:            Monitored Anesthesia Care Complications:        No immediate complications. Procedure:            Pre-Anesthesia Assessment:                       - Prior to the procedure, a History and Physical was                        performed, and patient medications, allergies and                        sensitivities were reviewed. The patient's tolerance of                        previous anesthesia was reviewed.                       - The risks and benefits of the procedure and the                        sedation options and risks were discussed with the                        patient. All questions were answered and informed                        consent was obtained.                       - ASA Grade Assessment: III - A patient with severe                        systemic disease.                       After obtaining informed consent, the endoscope was                        passed under direct vision. Throughout the procedure,                        the patient's blood pressure, pulse, and oxygen                        saturations were monitored continuously. The Endoscope                        was introduced through the mouth, and advanced to the                        third part of duodenum. The upper GI endoscopy was  accomplished with ease. The patient tolerated the                        procedure well. Findings:      A single 6 mm angioectasia without bleeding was found in the third       portion of the duodenum. Coagulation for hemostasis using  argon plasma       at 0.5 liters/minute and 20 watts was successful. Biopsies for histology       were taken with a cold forceps for evaluation of celiac disease.      The stomach was normal.      The esophagus was normal.      The cardia and gastric fundus were normal on retroflexion. Impression:           - A single non-bleeding angioectasia in the duodenum.                        Treated with argon plasma coagulation (APC). Biopsied.                       - Normal stomach.                       - Normal esophagus. Recommendation:       - Await pathology results.                       - Perform a colonoscopy today. Procedure Code(s):    --- Professional ---                       [id number], 81, Esophagogastroduodenoscopy, flexible,                        transoral; with control of bleeding, any method                       43239, Esophagogastroduodenoscopy, flexible, transoral;                        with biopsy, single or multiple Diagnosis Code(s):    --- Professional ---                       H54.562, Angiodysplasia of stomach and duodenum without                        bleeding                       D50.9, Iron deficiency anemia, unspecified CPT copyright 2017 American Medical Association. All rights reserved. The codes documented in this report are preliminary and upon coder review may  be revised to meet current compliance requirements. Jonathon Bellows, MD Jonathon Bellows MD, MD 06/22/2018 11:34:28 AM This report has been signed electronically. Number of Addenda: 0 Note Initiated On: 06/22/2018 11:14 AM      [facility]

## 2018-06-25 ENCOUNTER — Encounter: Payer: Self-pay | Admitting: Gastroenterology

## 2018-06-26 LAB — SURGICAL PATHOLOGY

## 2018-06-28 DIAGNOSIS — H02109 Unspecified ectropion of unspecified eye, unspecified eyelid: Secondary | ICD-10-CM | POA: Diagnosis not present

## 2018-07-02 ENCOUNTER — Encounter: Payer: Self-pay | Admitting: Gastroenterology

## 2018-07-02 NOTE — Progress Notes (Signed)
ok 

## 2018-07-05 ENCOUNTER — Ambulatory Visit (INDEPENDENT_AMBULATORY_CARE_PROVIDER_SITE_OTHER): Payer: Medicare Other

## 2018-07-05 DIAGNOSIS — Z23 Encounter for immunization: Secondary | ICD-10-CM | POA: Diagnosis not present

## 2018-07-05 NOTE — Addendum Note (Signed)
Addended by: Shawna Orleans on: 07/05/2018 03:11 PM   Modules accepted: Orders

## 2018-07-11 ENCOUNTER — Ambulatory Visit (INDEPENDENT_AMBULATORY_CARE_PROVIDER_SITE_OTHER): Payer: Medicare Other | Admitting: Gastroenterology

## 2018-07-11 ENCOUNTER — Encounter: Payer: Self-pay | Admitting: Gastroenterology

## 2018-07-11 VITALS — BP 144/66 | HR 65 | Ht 62.0 in | Wt 161.6 lb

## 2018-07-11 DIAGNOSIS — D500 Iron deficiency anemia secondary to blood loss (chronic): Secondary | ICD-10-CM

## 2018-07-11 DIAGNOSIS — R197 Diarrhea, unspecified: Secondary | ICD-10-CM | POA: Diagnosis not present

## 2018-07-11 NOTE — Progress Notes (Signed)
Jonathon Bellows MD, MRCP(U.K) [address]  [place]  [place], [place] [address]  Main: [phone]  Fax: [phone]   Primary Care Physician: Jerrol Banana., MD  Primary Gastroenterologist:  Dr. Jonathon Bellows   No chief complaint on file.   HPI: Stephanie Charles is a 84 y.o. female   Summary of history :   Initially seen by Dr Bonna Gains on 05/22/18 for anemia . Admitted to [facility] for diverticulitis in July 2019.  She has been on aspirin and Plavix.  Was seen by Dr. Bonna Gains for anemia.  Drop in hemoglobin from 12 g to 9 g over the 3 to 4 months.  Labs in June 2019 showed low iron of 25.  B12 of 585.  MCV has been 82. 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  Duodenal biopsy shows mild reactive duodenitis and no features of celiac disease.   Interval history 05/22/2018 to 07/11/2018  Diarrhea last 3 months- upto 5 times a day, very watery . No blood, reddish pink . Used to be dark but not any more. Very occasional abdominal pain. No prior abdominal surgeries. She is taking her Plavix. No artifificial sodas in her diet or artificial sugars.   Current Outpatient Medications  Medication Sig Dispense Refill  . albuterol (PROVENTIL) (2.5 MG/3ML) 0.083% nebulizer solution Take 2.5 mg by nebulization every 4 (four) hours as needed.     Marland Kitchen allopurinol (ZYLOPRIM) 300 MG tablet TAKE ONE TABLET BY MOUTH ONCE DAILY 90 tablet 3  . amitriptyline (ELAVIL) 10 MG tablet Take 1 tablet (10 mg total) by mouth at bedtime. 90 tablet 3  . amitriptyline (ELAVIL) 10 MG tablet TAKE 2 TABLETS BY MOUTH AT BEDTIME 60 tablet 12  . amLODipine (NORVASC) 5 MG tablet Take 1 tablet (5 mg total) by mouth daily. 30 tablet 12  . aspirin 81 MG tablet Take 81 mg by mouth daily.     Marland Kitchen atorvastatin (LIPITOR) 40 MG tablet TAKE ONE TABLET BY MOUTH ONCE DAILY 90 tablet 3  . carvedilol (COREG) 6.25 MG tablet Take 1 tablet (6.25 mg total) by mouth 2 (two) times daily. 180 tablet  3  . clopidogrel (PLAVIX) 75 MG tablet TAKE 1 TABLET BY MOUTH ONCE DAILY 90 tablet 3  . fluticasone (FLONASE) 50 MCG/ACT nasal spray USE 2 SPRAY(S) IN EACH NOSTRIL ONCE DAILY 16 g 11  . hydrALAZINE (APRESOLINE) 25 MG tablet Take 1 tablet (25 mg total) by mouth 3 (three) times daily. 270 tablet 3  . hydrochlorothiazide (HYDRODIURIL) 25 MG tablet TAKE 1 TABLET BY MOUTH ONCE DAILY 90 tablet 3  . isosorbide mononitrate (IMDUR) 30 MG 24 hr tablet Take 1 tablet (30 mg total) by mouth every morning. 30 tablet 12  . levothyroxine (SYNTHROID, LEVOTHROID) 137 MCG tablet TAKE ONE TABLET BY MOUTH ONCE DAILY 90 tablet 4  . losartan (COZAAR) 100 MG tablet TAKE ONE TABLET BY MOUTH ONCE DAILY 90 tablet 3  . Melatonin 3 MG TBDP Take 1 tablet by mouth at bedtime.    . mirtazapine (REMERON) 30 MG tablet Take 1 tablet (30 mg total) by mouth at bedtime. 90 tablet 3  . Multiple Vitamin (MULTIVITAMIN) capsule Take 1 capsule by mouth daily.    . sertraline (ZOLOFT) 25 MG tablet TAKE ONE TABLET BY MOUTH ONCE DAILY 90 tablet 3   No current facility-administered medications for this visit.     Allergies as of 07/11/2018 - Review Complete 06/22/2018  Allergen Reaction Noted  .  Levofloxacin  03/11/2015  . Lisinopril  06/14/2017  . Povidone-iodine Other (See Comments) 09/22/2017  . Simvastatin  03/11/2015  . Codeine Nausea And Vomiting 04/08/2014    ROS:  General: Negative for anorexia, weight loss, fever, chills, fatigue, weakness. ENT: Negative for hoarseness, difficulty swallowing , nasal congestion. CV: Negative for chest pain, angina, palpitations, dyspnea on exertion, peripheral edema.  Respiratory: Negative for dyspnea at rest, dyspnea on exertion, cough, sputum, wheezing.  GI: See history of present illness. GU:  Negative for dysuria, hematuria, urinary incontinence, urinary frequency, nocturnal urination.  Endo: Negative for unusual weight change.    Physical Examination:   BP (!) 144/66   Pulse 65    Ht 5\' 2"  (1.575 m)   Wt 161 lb 9.6 oz (73.3 kg)   BMI 29.56 kg/m   General: Well-nourished, well-developed in no acute distress.  Eyes: No icterus. Conjunctivae pink. Mouth: Oropharyngeal mucosa moist and pink , no lesions erythema or exudate. Lungs: Clear to auscultation bilaterally. Non-labored. Heart: Regular rate and rhythm, no murmurs rubs or gallops.  Abdomen: Bowel sounds are normal, nontender, nondistended, no hepatosplenomegaly or masses, no abdominal bruits or hernia , no rebound or guarding.   Extremities: No lower extremity edema. No clubbing or deformities. Neuro: Alert and oriented x 3.  Grossly intact. Skin: Warm and dry, no jaundice.   Psych: Alert and cooperative, normal mood and affect.   Imaging Studies: No results found.  Assessment and Plan:   Stephanie Charles is a 15 y.o. y/o female here to follow-up for iron deficiency anemia.  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  Anoscopy was negative.  There is been a good drop in her hemoglobin.  She has been on aspirin and Plavix.  The next step would be to proceed with capsule study of the small bowel to rule out AVMs of the small bowel.  Continue iron supplementation.  She has never had a complete iron panel checked so I will check that today, check urine analysis to rule out blood loss in the urine.  Discussed small bowel capsule to r/o small bowel AVM's for anemia- presently not keen , we will watch her counts closely and if they drop then will need I tto be done. She has diarrhea will commence on fiber pills and rule out infection .   Advised her to call me in a week to see how she is doing     Dr Jonathon Bellows  MD,MRCP [facility]) Follow up 3 months

## 2018-07-12 LAB — URINALYSIS
Bilirubin, UA: NEGATIVE
Glucose, UA: NEGATIVE
KETONES UA: NEGATIVE
NITRITE UA: NEGATIVE
PH UA: 5.5 (ref 5.0–7.5)
PROTEIN UA: NEGATIVE
RBC UA: NEGATIVE
Specific Gravity, UA: 1.02 (ref 1.005–1.030)
Urobilinogen, Ur: 0.2 mg/dL (ref 0.2–1.0)

## 2018-07-12 LAB — CBC WITH DIFFERENTIAL/PLATELET
BASOS: 1 %
Basophils Absolute: 0 10*3/uL (ref 0.0–0.2)
EOS (ABSOLUTE): 0.2 10*3/uL (ref 0.0–0.4)
Eos: 2 %
Hematocrit: 35.9 % (ref 34.0–46.6)
Hemoglobin: 11.8 g/dL (ref 11.1–15.9)
IMMATURE GRANS (ABS): 0 10*3/uL (ref 0.0–0.1)
Immature Granulocytes: 0 %
LYMPHS ABS: 1.6 10*3/uL (ref 0.7–3.1)
LYMPHS: 21 %
MCH: 29.4 pg (ref 26.6–33.0)
MCHC: 32.9 g/dL (ref 31.5–35.7)
MCV: 90 fL (ref 79–97)
MONOCYTES: 6 %
Monocytes Absolute: 0.4 10*3/uL (ref 0.1–0.9)
NEUTROS ABS: 5.3 10*3/uL (ref 1.4–7.0)
Neutrophils: 70 %
Platelets: 162 10*3/uL (ref 150–450)
RBC: 4.01 x10E6/uL (ref 3.77–5.28)
RDW: 17.1 % — ABNORMAL HIGH (ref 12.3–15.4)
WBC: 7.5 10*3/uL (ref 3.4–10.8)

## 2018-07-12 LAB — IRON,TIBC AND FERRITIN PANEL
FERRITIN: 29 ng/mL (ref 15–150)
IRON: 92 ug/dL (ref 27–139)
Iron Saturation: 28 % (ref 15–55)
TIBC: 326 ug/dL (ref 250–450)
UIBC: 234 ug/dL (ref 118–369)

## 2018-07-14 ENCOUNTER — Other Ambulatory Visit
Admission: RE | Admit: 2018-07-14 | Discharge: 2018-07-14 | Disposition: A | Discharge status: Home / Self Care | Payer: Medicare Other | Source: Ambulatory Visit | Attending: Gastroenterology | Admitting: Gastroenterology

## 2018-07-14 DIAGNOSIS — D500 Iron deficiency anemia secondary to blood loss (chronic): Secondary | ICD-10-CM | POA: Insufficient documentation

## 2018-07-14 DIAGNOSIS — R197 Diarrhea, unspecified: Secondary | ICD-10-CM | POA: Diagnosis not present

## 2018-07-14 LAB — GASTROINTESTINAL PANEL BY PCR, STOOL (REPLACES STOOL CULTURE)
ASTROVIRUS: NOT DETECTED
Adenovirus F40/41: NOT DETECTED
CYCLOSPORA CAYETANENSIS: NOT DETECTED
Campylobacter species: NOT DETECTED
Cryptosporidium: NOT DETECTED
ENTEROTOXIGENIC E COLI (ETEC): NOT DETECTED
Entamoeba histolytica: NOT DETECTED
Enteroaggregative E coli (EAEC): NOT DETECTED
Enteropathogenic E coli (EPEC): NOT DETECTED
Giardia lamblia: NOT DETECTED
Norovirus GI/GII: NOT DETECTED
PLESIMONAS SHIGELLOIDES: NOT DETECTED
Rotavirus A: NOT DETECTED
Salmonella species: NOT DETECTED
Sapovirus (I, II, IV, and V): NOT DETECTED
Shiga like toxin producing E coli (STEC): NOT DETECTED
Shigella/Enteroinvasive E coli (EIEC): NOT DETECTED
VIBRIO SPECIES: NOT DETECTED
Vibrio cholerae: NOT DETECTED
Yersinia enterocolitica: NOT DETECTED

## 2018-07-14 LAB — C DIFFICILE QUICK SCREEN W PCR REFLEX
C DIFFICILE (CDIFF) TOXIN: NEGATIVE
C DIFFICLE (CDIFF) ANTIGEN: NEGATIVE
C Diff interpretation: NOT DETECTED

## 2018-07-16 LAB — CALPROTECTIN, FECAL: CALPROTECTIN, FECAL: 235 ug/g — AB (ref 0–120)

## 2018-07-17 ENCOUNTER — Other Ambulatory Visit: Payer: Self-pay | Admitting: Family Medicine

## 2018-07-19 DIAGNOSIS — L821 Other seborrheic keratosis: Secondary | ICD-10-CM | POA: Diagnosis not present

## 2018-07-19 DIAGNOSIS — L578 Other skin changes due to chronic exposure to nonionizing radiation: Secondary | ICD-10-CM | POA: Diagnosis not present

## 2018-07-19 DIAGNOSIS — L570 Actinic keratosis: Secondary | ICD-10-CM | POA: Diagnosis not present

## 2018-07-19 DIAGNOSIS — Z1283 Encounter for screening for malignant neoplasm of skin: Secondary | ICD-10-CM | POA: Diagnosis not present

## 2018-08-06 ENCOUNTER — Other Ambulatory Visit: Payer: Self-pay | Admitting: Family Medicine

## 2018-08-06 DIAGNOSIS — I10 Essential (primary) hypertension: Secondary | ICD-10-CM

## 2018-08-07 ENCOUNTER — Telehealth: Payer: Self-pay | Admitting: Gastroenterology

## 2018-08-07 NOTE — Telephone Encounter (Signed)
Pt is calling for Lab results °

## 2018-08-08 ENCOUNTER — Ambulatory Visit: Payer: Self-pay | Admitting: Family Medicine

## 2018-08-11 NOTE — Telephone Encounter (Signed)
Spoke with pt and informed her of her stool test lab results.

## 2018-08-21 ENCOUNTER — Other Ambulatory Visit: Payer: Self-pay | Admitting: Family Medicine

## 2018-08-23 ENCOUNTER — Telehealth: Payer: Self-pay | Admitting: Gastroenterology

## 2018-08-23 NOTE — Telephone Encounter (Signed)
Patient states she had a questions about the stool test results from 10.9.19 and wanted to speak with Dr.Anna's nurse.

## 2018-08-28 NOTE — Telephone Encounter (Signed)
Called pt regarding her questions about her stool test results. Unable to contact and pt phone did not go to vm

## 2018-09-06 ENCOUNTER — Ambulatory Visit: Payer: Self-pay | Admitting: Gastroenterology

## 2018-09-08 ENCOUNTER — Other Ambulatory Visit: Payer: Self-pay

## 2018-09-08 DIAGNOSIS — D500 Iron deficiency anemia secondary to blood loss (chronic): Secondary | ICD-10-CM

## 2018-09-08 NOTE — Telephone Encounter (Signed)
Spoke with pt and clarified her stool test results. I also informed pt of Dr. Georgeann Oppenheim suggestion for pt to proceed with scheduling the Capsule Study. Pt agrees. Pt is aware of prior prep instructions and appointment information. Pt is also aware that she'll receive printed instructions via mail.

## 2018-10-09 ENCOUNTER — Ambulatory Visit: Admission: RE | Admit: 2018-10-09 | Payer: Medicare Other | Source: Home / Self Care | Admitting: Gastroenterology

## 2018-10-09 ENCOUNTER — Encounter: Admission: RE | Payer: Self-pay | Source: Home / Self Care

## 2018-10-09 SURGERY — IMAGING PROCEDURE, GI TRACT, INTRALUMINAL, VIA CAPSULE

## 2018-11-14 ENCOUNTER — Other Ambulatory Visit: Payer: Self-pay | Admitting: Family Medicine

## 2019-01-06 ENCOUNTER — Other Ambulatory Visit: Payer: Self-pay | Admitting: Family Medicine

## 2019-01-06 DIAGNOSIS — F3289 Other specified depressive episodes: Secondary | ICD-10-CM

## 2019-01-09 ENCOUNTER — Ambulatory Visit (INDEPENDENT_AMBULATORY_CARE_PROVIDER_SITE_OTHER): Payer: Medicare Other | Admitting: Family Medicine

## 2019-01-09 ENCOUNTER — Encounter: Payer: Self-pay | Admitting: Family Medicine

## 2019-01-09 ENCOUNTER — Other Ambulatory Visit: Payer: Self-pay

## 2019-01-09 VITALS — BP 120/72 | HR 64 | Temp 98.0°F | Resp 20 | Wt 165.0 lb

## 2019-01-09 DIAGNOSIS — I10 Essential (primary) hypertension: Secondary | ICD-10-CM

## 2019-01-09 DIAGNOSIS — I872 Venous insufficiency (chronic) (peripheral): Secondary | ICD-10-CM

## 2019-01-09 DIAGNOSIS — I6529 Occlusion and stenosis of unspecified carotid artery: Secondary | ICD-10-CM | POA: Diagnosis not present

## 2019-01-09 DIAGNOSIS — Z8673 Personal history of transient ischemic attack (TIA), and cerebral infarction without residual deficits: Secondary | ICD-10-CM

## 2019-01-09 DIAGNOSIS — D649 Anemia, unspecified: Secondary | ICD-10-CM | POA: Diagnosis not present

## 2019-01-09 DIAGNOSIS — M546 Pain in thoracic spine: Secondary | ICD-10-CM

## 2019-01-09 NOTE — Patient Instructions (Signed)
Start Heating pad daily and Tylenol as needed for pain.

## 2019-01-09 NOTE — Progress Notes (Signed)
Patient: Stephanie Charles Female    DOB: [birth year]   85 y.o.   MRN: [id number] Visit Date: 01/09/2019  Today's Provider: [name], MD   No chief complaint on file.  Subjective:     HPI She complains of 4 days of left scapular and thoracic back pain  without any known trauma.  It is not exertional.  There are certainly certain movements such as twisting her upper back which can exacerbate the pain.  There are no alleviating circumstances.  No associated chest pain, no difficulty with breathing. Overall her recent months she has felt pretty well.  She has not had to come back in some time.  She had anemia last summer and 6 months ago the CBC was normal but has not had that checked since then.  Her blood pressures been running well.  Very happily she has been able to stay out of the hospital also. Allergies  Allergen Reactions  . Levofloxacin     Mouth sores  . Lisinopril     Cough?  . Povidone-Iodine Other (See Comments)    Severe blistering and itchiness. Severe redness  . Simvastatin     Myalgias  . Codeine Nausea And Vomiting     Current Outpatient Medications:  .  albuterol (PROVENTIL) (2.5 MG/3ML) 0.083% nebulizer solution, Take 2.5 mg by nebulization every 4 (four) hours as needed. , Disp: , Rfl:  .  allopurinol (ZYLOPRIM) 300 MG tablet, TAKE 1 TABLET BY MOUTH ONCE DAILY, Disp: 90 tablet, Rfl: 3 .  amitriptyline (ELAVIL) 10 MG tablet, Take 1 tablet (10 mg total) by mouth at bedtime., Disp: 90 tablet, Rfl: 3 .  amitriptyline (ELAVIL) 10 MG tablet, TAKE 2 TABLETS BY MOUTH AT BEDTIME, Disp: 60 tablet, Rfl: 12 .  amLODipine (NORVASC) 5 MG tablet, Take 1 tablet (5 mg total) by mouth daily., Disp: 30 tablet, Rfl: 12 .  aspirin 81 MG tablet, Take 81 mg by mouth daily. , Disp: , Rfl:  .  atorvastatin (LIPITOR) 40 MG tablet, TAKE 1 TABLET BY MOUTH ONCE DAILY, Disp: 90 tablet, Rfl: 3 .  carvedilol (COREG) 6.25 MG tablet, Take 1 tablet (6.25 mg total) by mouth 2 (two)  times daily., Disp: 180 tablet, Rfl: 3 .  clopidogrel (PLAVIX) 75 MG tablet, TAKE 1 TABLET BY MOUTH ONCE DAILY, Disp: 90 tablet, Rfl: 3 .  fluticasone (FLONASE) 50 MCG/ACT nasal spray, USE 2 SPRAY(S) IN EACH NOSTRIL ONCE DAILY, Disp: 16 g, Rfl: 11 .  hydrALAZINE (APRESOLINE) 25 MG tablet, TAKE 1 TABLET BY MOUTH THREE TIMES DAILY, Disp: 270 tablet, Rfl: 3 .  hydrochlorothiazide (HYDRODIURIL) 25 MG tablet, TAKE 1 TABLET BY MOUTH ONCE DAILY, Disp: 90 tablet, Rfl: 3 .  isosorbide mononitrate (IMDUR) 30 MG 24 hr tablet, Take 1 tablet (30 mg total) by mouth every morning., Disp: 30 tablet, Rfl: 12 .  levothyroxine (SYNTHROID, LEVOTHROID) 137 MCG tablet, TAKE 1 TABLET BY MOUTH ONCE DAILY, Disp: 90 tablet, Rfl: 0 .  losartan (COZAAR) 100 MG tablet, TAKE 1 TABLET BY MOUTH ONCE DAILY, Disp: 90 tablet, Rfl: 3 .  Melatonin 3 MG TBDP, Take 1 tablet by mouth at bedtime., Disp: , Rfl:  .  mirtazapine (REMERON) 30 MG tablet, Take 1 tablet (30 mg total) by mouth at bedtime., Disp: 90 tablet, Rfl: 3 .  Multiple Vitamin (MULTIVITAMIN) capsule, Take 1 capsule by mouth daily., Disp: , Rfl:  .  sertraline (ZOLOFT) 25 MG tablet, Take 1 tablet by mouth once  daily, Disp: 90 tablet, Rfl: 0  Review of Systems  Constitutional: Negative.   HENT: Negative.   Eyes: Negative.   Respiratory: Negative.   Cardiovascular: Negative.   Gastrointestinal: Negative.   Endocrine: Negative.   Genitourinary: Negative.   Musculoskeletal: Positive for back pain.  Allergic/Immunologic: Negative.   Neurological: Negative.   Hematological: Negative.   Psychiatric/Behavioral: Negative.   All other systems reviewed and are negative.   Social History   Tobacco Use  . Smoking status: Never Smoker  . Smokeless tobacco: Never Used  Substance Use Topics  . Alcohol use: No    Alcohol/week: 0.0 standard drinks      Objective:   There were no vitals taken for this visit. There were no vitals filed for this visit.   Physical Exam  Vitals signs reviewed.  Constitutional:      Appearance: She is well-developed.  HENT:     Head: Normocephalic and atraumatic.     Right Ear: External ear normal.     Left Ear: External ear normal.     Nose: Nose normal.  Eyes:     General: No scleral icterus.    Conjunctiva/sclera: Conjunctivae normal.  Neck:     Thyroid: No thyromegaly.  Cardiovascular:     Rate and Rhythm: Normal rate and regular rhythm.     Heart sounds: Normal heart sounds.  Pulmonary:     Effort: Pulmonary effort is normal.     Breath sounds: Normal breath sounds.  Abdominal:     Palpations: Abdomen is soft.  Musculoskeletal:     Comments: Examination of her upper back reveals bruising over the entire left thoracic area.  It is like there is an imprint of a pattern such as a chair or a wall.  It is nontender but is aggravated with certain motions.  Skin:    General: Skin is warm and dry.  Neurological:     Mental Status: She is alert and oriented to person, place, and time. Mental status is at baseline.  Psychiatric:        Mood and Affect: Mood normal.        Behavior: Behavior normal.        Thought Content: Thought content normal.        Judgment: Judgment normal.         Assessment & Plan    1. Thoracic back pain, unspecified back pain laterality, unspecified chronicity Absolutely think this is musculoskeletal pain.  Will use heating pad and Tylenol to treat this for now.  No imaging needed. More than 50% of 25-minute visit is spent in counseling and coordination of care. 2. Benign essential HTN At this time for follow-up blood pressure and lab work associated with checking this.  For chronic kidney disease and for abnormal liver function. - Comprehensive metabolic panel  3. Anemia, unspecified type History of anemia.  Follow-up CBC - CBC with Differential/Platelet  4. External carotid artery stenosis   5. Chronic venous insufficiency   6. 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     Jaxan Michel Cranford Mon, MD  [facility]

## 2019-01-09 NOTE — Progress Notes (Deleted)
Patient: Stephanie Charles Female    DOB: 1929-12-04   85 y.o.   MRN: [id number] Visit Date: 01/09/2019  Today's Provider: [name], MD   Chief Complaint  Patient presents with  . Back Pain   Subjective:     HPI  Patient comes in today c/o mid back pain that has been constant X 4 days. She reports that she has used heating pad, alcohol rub, and rest without relief. She denies any injuries. She reports that the pain does not radiate.   Allergies  Allergen Reactions  . Levofloxacin     Mouth sores  . Lisinopril     Cough?  . Povidone-Iodine Other (See Comments)    Severe blistering and itchiness. Severe redness  . Simvastatin     Myalgias  . Codeine Nausea And Vomiting     Current Outpatient Medications:  .  allopurinol (ZYLOPRIM) 300 MG tablet, TAKE 1 TABLET BY MOUTH ONCE DAILY, Disp: 90 tablet, Rfl: 3 .  amitriptyline (ELAVIL) 10 MG tablet, Take 1 tablet (10 mg total) by mouth at bedtime., Disp: 90 tablet, Rfl: 3 .  amitriptyline (ELAVIL) 10 MG tablet, TAKE 2 TABLETS BY MOUTH AT BEDTIME, Disp: 60 tablet, Rfl: 12 .  amLODipine (NORVASC) 5 MG tablet, Take 1 tablet (5 mg total) by mouth daily., Disp: 30 tablet, Rfl: 12 .  aspirin 81 MG tablet, Take 81 mg by mouth daily. , Disp: , Rfl:  .  atorvastatin (LIPITOR) 40 MG tablet, TAKE 1 TABLET BY MOUTH ONCE DAILY, Disp: 90 tablet, Rfl: 3 .  carvedilol (COREG) 6.25 MG tablet, Take 1 tablet (6.25 mg total) by mouth 2 (two) times daily., Disp: 180 tablet, Rfl: 3 .  clopidogrel (PLAVIX) 75 MG tablet, TAKE 1 TABLET BY MOUTH ONCE DAILY, Disp: 90 tablet, Rfl: 3 .  fluticasone (FLONASE) 50 MCG/ACT nasal spray, USE 2 SPRAY(S) IN EACH NOSTRIL ONCE DAILY, Disp: 16 g, Rfl: 11 .  hydrALAZINE (APRESOLINE) 25 MG tablet, TAKE 1 TABLET BY MOUTH THREE TIMES DAILY, Disp: 270 tablet, Rfl: 3 .  hydrochlorothiazide (HYDRODIURIL) 25 MG tablet, TAKE 1 TABLET BY MOUTH ONCE DAILY, Disp: 90 tablet, Rfl: 3 .  isosorbide mononitrate (IMDUR) 30 MG  24 hr tablet, Take 1 tablet (30 mg total) by mouth every morning., Disp: 30 tablet, Rfl: 12 .  levothyroxine (SYNTHROID, LEVOTHROID) 137 MCG tablet, TAKE 1 TABLET BY MOUTH ONCE DAILY, Disp: 90 tablet, Rfl: 0 .  losartan (COZAAR) 100 MG tablet, TAKE 1 TABLET BY MOUTH ONCE DAILY, Disp: 90 tablet, Rfl: 3 .  Melatonin 3 MG TBDP, Take 1 tablet by mouth at bedtime., Disp: , Rfl:  .  mirtazapine (REMERON) 30 MG tablet, Take 1 tablet (30 mg total) by mouth at bedtime., Disp: 90 tablet, Rfl: 3 .  Multiple Vitamin (MULTIVITAMIN) capsule, Take 1 capsule by mouth daily., Disp: , Rfl:  .  sertraline (ZOLOFT) 25 MG tablet, Take 1 tablet by mouth once daily, Disp: 90 tablet, Rfl: 0 .  albuterol (PROVENTIL) (2.5 MG/3ML) 0.083% nebulizer solution, Take 2.5 mg by nebulization every 4 (four) hours as needed. , Disp: , Rfl:   Review of Systems  Constitutional: Negative for activity change, appetite change, chills, diaphoresis, fatigue and fever.  Respiratory: Negative for cough and shortness of breath.   Cardiovascular: Negative for chest pain and palpitations.  Musculoskeletal: Positive for arthralgias, back pain and myalgias.  Neurological: Negative for dizziness, light-headedness and headaches.    Social History   Tobacco Use  .  Smoking status: Never Smoker  . Smokeless tobacco: Never Used  Substance Use Topics  . Alcohol use: No    Alcohol/week: 0.0 standard drinks      Objective:   BP 120/72 (BP Location: Left Arm, Patient Position: Sitting, Cuff Size: Normal)   Pulse 64   Temp 98 F (36.7 C)   Resp 20   Wt 165 lb (74.8 kg)   SpO2 98%   BMI 30.18 kg/m  Vitals:   01/09/19 1116  BP: 120/72  Pulse: 64  Resp: 20  Temp: 98 F (36.7 C)  SpO2: 98%  Weight: 165 lb (74.8 kg)     Physical Exam      Assessment & Plan        [name], MD  [facility]

## 2019-01-09 NOTE — Progress Notes (Deleted)
       Patient: Stephanie Charles Female    DOB: 1930-07-19   85 y.o.   MRN: [id number] Visit Date: 01/09/2019  Today's Provider: [name], MD   No chief complaint on file.  Subjective:     HPI  Allergies  Allergen Reactions  . Levofloxacin     Mouth sores  . Lisinopril     Cough?  . Povidone-Iodine Other (See Comments)    Severe blistering and itchiness. Severe redness  . Simvastatin     Myalgias  . Codeine Nausea And Vomiting     Current Outpatient Medications:  .  albuterol (PROVENTIL) (2.5 MG/3ML) 0.083% nebulizer solution, Take 2.5 mg by nebulization every 4 (four) hours as needed. , Disp: , Rfl:  .  allopurinol (ZYLOPRIM) 300 MG tablet, TAKE 1 TABLET BY MOUTH ONCE DAILY, Disp: 90 tablet, Rfl: 3 .  amitriptyline (ELAVIL) 10 MG tablet, Take 1 tablet (10 mg total) by mouth at bedtime., Disp: 90 tablet, Rfl: 3 .  amitriptyline (ELAVIL) 10 MG tablet, TAKE 2 TABLETS BY MOUTH AT BEDTIME, Disp: 60 tablet, Rfl: 12 .  amLODipine (NORVASC) 5 MG tablet, Take 1 tablet (5 mg total) by mouth daily., Disp: 30 tablet, Rfl: 12 .  aspirin 81 MG tablet, Take 81 mg by mouth daily. , Disp: , Rfl:  .  atorvastatin (LIPITOR) 40 MG tablet, TAKE 1 TABLET BY MOUTH ONCE DAILY, Disp: 90 tablet, Rfl: 3 .  carvedilol (COREG) 6.25 MG tablet, Take 1 tablet (6.25 mg total) by mouth 2 (two) times daily., Disp: 180 tablet, Rfl: 3 .  clopidogrel (PLAVIX) 75 MG tablet, TAKE 1 TABLET BY MOUTH ONCE DAILY, Disp: 90 tablet, Rfl: 3 .  fluticasone (FLONASE) 50 MCG/ACT nasal spray, USE 2 SPRAY(S) IN EACH NOSTRIL ONCE DAILY, Disp: 16 g, Rfl: 11 .  hydrALAZINE (APRESOLINE) 25 MG tablet, TAKE 1 TABLET BY MOUTH THREE TIMES DAILY, Disp: 270 tablet, Rfl: 3 .  hydrochlorothiazide (HYDRODIURIL) 25 MG tablet, TAKE 1 TABLET BY MOUTH ONCE DAILY, Disp: 90 tablet, Rfl: 3 .  isosorbide mononitrate (IMDUR) 30 MG 24 hr tablet, Take 1 tablet (30 mg total) by mouth every morning., Disp: 30 tablet, Rfl: 12 .  levothyroxine  (SYNTHROID, LEVOTHROID) 137 MCG tablet, TAKE 1 TABLET BY MOUTH ONCE DAILY, Disp: 90 tablet, Rfl: 0 .  losartan (COZAAR) 100 MG tablet, TAKE 1 TABLET BY MOUTH ONCE DAILY, Disp: 90 tablet, Rfl: 3 .  Melatonin 3 MG TBDP, Take 1 tablet by mouth at bedtime., Disp: , Rfl:  .  mirtazapine (REMERON) 30 MG tablet, Take 1 tablet (30 mg total) by mouth at bedtime., Disp: 90 tablet, Rfl: 3 .  Multiple Vitamin (MULTIVITAMIN) capsule, Take 1 capsule by mouth daily., Disp: , Rfl:  .  sertraline (ZOLOFT) 25 MG tablet, Take 1 tablet by mouth once daily, Disp: 90 tablet, Rfl: 0  Review of Systems  Social History   Tobacco Use  . Smoking status: Never Smoker  . Smokeless tobacco: Never Used  Substance Use Topics  . Alcohol use: No    Alcohol/week: 0.0 standard drinks      Objective:   There were no vitals taken for this visit. There were no vitals filed for this visit.   Physical Exam      Assessment & Plan        [name], MD  [facility]

## 2019-01-10 LAB — COMPREHENSIVE METABOLIC PANEL
ALT: 13 IU/L (ref 0–32)
AST: 15 IU/L (ref 0–40)
Albumin/Globulin Ratio: 2.1 (ref 1.2–2.2)
Albumin: 4 g/dL (ref 3.6–4.6)
Alkaline Phosphatase: 109 IU/L (ref 39–117)
BUN/Creatinine Ratio: 20 (ref 12–28)
BUN: 27 mg/dL (ref 8–27)
Bilirubin Total: 0.2 mg/dL (ref 0.0–1.2)
CO2: 22 mmol/L (ref 20–29)
Calcium: 9 mg/dL (ref 8.7–10.3)
Chloride: 103 mmol/L (ref 96–106)
Creatinine, Ser: 1.35 mg/dL — ABNORMAL HIGH (ref 0.57–1.00)
GFR calc Af Amer: 41 mL/min/{1.73_m2} — ABNORMAL LOW (ref >59)
GFR calc non Af Amer: 36 mL/min/{1.73_m2} — ABNORMAL LOW (ref >59)
Globulin, Total: 1.9 g/dL (ref 1.5–4.5)
Glucose: 120 mg/dL — ABNORMAL HIGH (ref 65–99)
Potassium: 3.6 mmol/L (ref 3.5–5.2)
Sodium: 141 mmol/L (ref 134–144)
Total Protein: 5.9 g/dL — ABNORMAL LOW (ref 6.0–8.5)

## 2019-01-10 LAB — CBC WITH DIFFERENTIAL/PLATELET
Basophils Absolute: 0.1 10*3/uL (ref 0.0–0.2)
Basos: 1 %
EOS (ABSOLUTE): 0.2 10*3/uL (ref 0.0–0.4)
Eos: 2 %
Hematocrit: 25.9 % — ABNORMAL LOW (ref 34.0–46.6)
Hemoglobin: 8 g/dL — ABNORMAL LOW (ref 11.1–15.9)
Immature Grans (Abs): 0 10*3/uL (ref 0.0–0.1)
Immature Granulocytes: 0 %
Lymphocytes Absolute: 1.3 10*3/uL (ref 0.7–3.1)
Lymphs: 19 %
MCH: 26.1 pg — ABNORMAL LOW (ref 26.6–33.0)
MCHC: 30.9 g/dL — ABNORMAL LOW (ref 31.5–35.7)
MCV: 85 fL (ref 79–97)
Monocytes Absolute: 0.4 10*3/uL (ref 0.1–0.9)
Monocytes: 5 %
Neutrophils Absolute: 5 10*3/uL (ref 1.4–7.0)
Neutrophils: 73 %
Platelets: 190 10*3/uL (ref 150–450)
RBC: 3.06 x10E6/uL — ABNORMAL LOW (ref 3.77–5.28)
RDW: 14.4 % (ref 11.7–15.4)
WBC: 6.9 10*3/uL (ref 3.4–10.8)

## 2019-01-18 ENCOUNTER — Other Ambulatory Visit: Payer: Self-pay | Admitting: Family Medicine

## 2019-01-18 ENCOUNTER — Ambulatory Visit (INDEPENDENT_AMBULATORY_CARE_PROVIDER_SITE_OTHER): Payer: Medicare Other | Admitting: Family Medicine

## 2019-01-18 ENCOUNTER — Other Ambulatory Visit: Payer: Self-pay

## 2019-01-18 ENCOUNTER — Encounter: Payer: Self-pay | Admitting: Family Medicine

## 2019-01-18 VITALS — BP 160/58 | HR 66 | Temp 98.2°F | Ht 63.0 in | Wt 169.2 lb

## 2019-01-18 DIAGNOSIS — R079 Chest pain, unspecified: Secondary | ICD-10-CM | POA: Diagnosis not present

## 2019-01-18 DIAGNOSIS — N189 Chronic kidney disease, unspecified: Secondary | ICD-10-CM

## 2019-01-18 DIAGNOSIS — I10 Essential (primary) hypertension: Secondary | ICD-10-CM

## 2019-01-18 DIAGNOSIS — D649 Anemia, unspecified: Secondary | ICD-10-CM | POA: Diagnosis not present

## 2019-01-18 DIAGNOSIS — D500 Iron deficiency anemia secondary to blood loss (chronic): Secondary | ICD-10-CM

## 2019-01-18 DIAGNOSIS — K5520 Angiodysplasia of colon without hemorrhage: Secondary | ICD-10-CM

## 2019-01-18 DIAGNOSIS — K219 Gastro-esophageal reflux disease without esophagitis: Secondary | ICD-10-CM

## 2019-01-18 MED ORDER — PANTOPRAZOLE SODIUM 40 MG PO TBEC: 40.0000 mg | DELAYED_RELEASE_TABLET | Freq: Every day | ORAL | 5 refills | Status: DC

## 2019-01-18 NOTE — Patient Instructions (Signed)
Pt needs to call office back on Monday 01/29/19 to get an appt for fup on Iron per Rosanna Randy

## 2019-01-18 NOTE — Progress Notes (Signed)
Patient: Stephanie Charles Female    DOB: 03-Nov-1929   85 y.o.   MRN: [id number] Visit Date: 01/18/2019  Today's Provider: [name], MD   Chief Complaint  Patient presents with  . Follow-up    1 week for Iron check and labs   Subjective:     HPI   Follow up for Iron  The patient was last seen for this 1 weeks ago. Changes made at last visit include no changes.  She reports good compliance with treatment. She feels that condition is pt feels the sound of her heartbeat is louder and she was having some chest pain. She is not having side effects.  She states she feels about 1 hour of chest tightness last night.  No associated symptoms.  No diaphoresis shortness of breath nausea or vomiting.  She was lying down try to go to sleep when it happened.  He thinks it was probably anxiety.  Has had no chest pain otherwise and no exertional pain specifically. She also describes any GI symptoms.  Specifically no melena or hematochezia. ------------------------------------------------------------------------------------   Allergies  Allergen Reactions  . Levofloxacin     Mouth sores  . Lisinopril     Cough?  . Povidone-Iodine Other (See Comments)    Severe blistering and itchiness. Severe redness  . Simvastatin     Myalgias  . Codeine Nausea And Vomiting     Current Outpatient Medications:  .  allopurinol (ZYLOPRIM) 300 MG tablet, TAKE 1 TABLET BY MOUTH ONCE DAILY, Disp: 90 tablet, Rfl: 3 .  amitriptyline (ELAVIL) 10 MG tablet, Take 1 tablet (10 mg total) by mouth at bedtime., Disp: 90 tablet, Rfl: 3 .  amitriptyline (ELAVIL) 10 MG tablet, TAKE 2 TABLETS BY MOUTH AT BEDTIME, Disp: 60 tablet, Rfl: 12 .  amLODipine (NORVASC) 5 MG tablet, Take 1 tablet (5 mg total) by mouth daily., Disp: 30 tablet, Rfl: 12 .  aspirin 81 MG tablet, Take 81 mg by mouth daily. , Disp: , Rfl:  .  atorvastatin (LIPITOR) 40 MG tablet, TAKE 1 TABLET BY MOUTH ONCE DAILY, Disp: 90 tablet, Rfl:  3 .  carvedilol (COREG) 6.25 MG tablet, Take 1 tablet (6.25 mg total) by mouth 2 (two) times daily., Disp: 180 tablet, Rfl: 3 .  clopidogrel (PLAVIX) 75 MG tablet, TAKE 1 TABLET BY MOUTH ONCE DAILY, Disp: 90 tablet, Rfl: 3 .  fluticasone (FLONASE) 50 MCG/ACT nasal spray, USE 2 SPRAY(S) IN EACH NOSTRIL ONCE DAILY, Disp: 16 g, Rfl: 11 .  hydrALAZINE (APRESOLINE) 25 MG tablet, TAKE 1 TABLET BY MOUTH THREE TIMES DAILY, Disp: 270 tablet, Rfl: 3 .  hydrochlorothiazide (HYDRODIURIL) 25 MG tablet, TAKE 1 TABLET BY MOUTH ONCE DAILY, Disp: 90 tablet, Rfl: 3 .  isosorbide mononitrate (IMDUR) 30 MG 24 hr tablet, Take 1 tablet (30 mg total) by mouth every morning., Disp: 30 tablet, Rfl: 12 .  levothyroxine (SYNTHROID, LEVOTHROID) 137 MCG tablet, TAKE 1 TABLET BY MOUTH ONCE DAILY, Disp: 90 tablet, Rfl: 0 .  losartan (COZAAR) 100 MG tablet, TAKE 1 TABLET BY MOUTH ONCE DAILY, Disp: 90 tablet, Rfl: 3 .  Melatonin 3 MG TBDP, Take 1 tablet by mouth at bedtime., Disp: , Rfl:  .  mirtazapine (REMERON) 30 MG tablet, Take 1 tablet (30 mg total) by mouth at bedtime., Disp: 90 tablet, Rfl: 3 .  Multiple Vitamin (MULTIVITAMIN) capsule, Take 1 capsule by mouth daily., Disp: , Rfl:  .  sertraline (ZOLOFT) 25 MG tablet, Take  1 tablet by mouth once daily, Disp: 90 tablet, Rfl: 0 .  albuterol (PROVENTIL) (2.5 MG/3ML) 0.083% nebulizer solution, Take 2.5 mg by nebulization every 4 (four) hours as needed. , Disp: , Rfl:   Review of Systems  Constitutional: Negative.   HENT: Negative.   Eyes: Negative.   Respiratory: Negative.   Cardiovascular: Positive for chest pain (last night 01/17/19 at bedtime for about an hour).  Gastrointestinal: Negative.   Endocrine: Negative.   Genitourinary: Negative.   Musculoskeletal: Negative.   Skin: Negative.   Allergic/Immunologic: Negative.   Neurological: Negative.   Hematological: Negative.   Psychiatric/Behavioral: The patient is nervous/anxious.     Social History   Tobacco Use   . Smoking status: Never Smoker  . Smokeless tobacco: Never Used  Substance Use Topics  . Alcohol use: No    Alcohol/week: 0.0 standard drinks      Objective:   BP (!) 160/58 (BP Location: Right Arm, Patient Position: Sitting, Cuff Size: Normal)   Pulse 66   Temp 98.2 F (36.8 C) (Oral)   Ht 5\' 3"  (1.6 m)   Wt 169 lb 3.2 oz (76.7 kg)   SpO2 97%   BMI 29.97 kg/m  Vitals:   01/18/19 1106  BP: (!) 160/58  Pulse: 66  Temp: 98.2 F (36.8 C)  TempSrc: Oral  SpO2: 97%  Weight: 169 lb 3.2 oz (76.7 kg)  Height: 5\' 3"  (1.6 m)     Physical Exam Vitals signs reviewed.  Constitutional:      Appearance: Normal appearance.  HENT:     Head: Normocephalic and atraumatic.     Right Ear: External ear normal.     Left Ear: External ear normal.     Nose: Nose normal.  Eyes:     General: No scleral icterus.    Conjunctiva/sclera: Conjunctivae normal.  Cardiovascular:     Rate and Rhythm: Normal rate and regular rhythm.     Heart sounds: Normal heart sounds.  Pulmonary:     Effort: Pulmonary effort is normal.     Breath sounds: Normal breath sounds.  Abdominal:     Palpations: Abdomen is soft.  Lymphadenopathy:     Cervical: No cervical adenopathy.  Skin:    General: Skin is warm and dry.  Neurological:     Mental Status: She is alert and oriented to person, place, and time. Mental status is at baseline.  Psychiatric:        Mood and Affect: Mood normal.        Thought Content: Thought content normal.        Judgment: Judgment normal.    EKG reveals old left bundle branch block with no new issues noted on EKG.     Assessment & Plan    1. Chest pain, unspecified type I think this is anxiety.  Patient is worried about coronavirus pandemic and her anemia and having to go back to the hospital.  Discussed this at some length today. - EKG 12-Lead - CBC with Differential/Platelet  2. Anemia, unspecified type If the anemia is responding to the iron therapy will recheck in  2 weeks.  If it is not we will need to get her back in front of GI to consider further work-up or evaluation.  May also need transfusion or parenteral therapy if she does not respond.  3. Iron deficiency anemia due to chronic blood loss Patient had AVM in her colon that was coagulated.  May need capsule endoscopy. - CBC with Differential/Platelet -  Iron, TIBC and Ferritin Panel  4. Chronic kidney disease, unspecified CKD stage We will advised patient to hydrate and will recheck labs today. - Renal function panel  5. Benign essential HTN Fair control.  I think the patient is agitated today.  This is a normal state for her.  6. Gastroesophageal reflux disease, esophagitis presence not specified Add Pantoprazole today.  7. AVM (arteriovenous malformation) of colon     I have done the exam and reviewed the above chart and it is accurate to the best of my knowledge. Development worker, community has been used in this note in any air is in the dictation or transcription are unintentional.  [name], MD  [place]

## 2019-01-19 ENCOUNTER — Telehealth: Payer: Self-pay

## 2019-01-19 LAB — RENAL FUNCTION PANEL
Albumin: 4.2 g/dL (ref 3.6–4.6)
BUN/Creatinine Ratio: 13 (ref 12–28)
BUN: 16 mg/dL (ref 8–27)
CO2: 23 mmol/L (ref 20–29)
Calcium: 9.1 mg/dL (ref 8.7–10.3)
Chloride: 104 mmol/L (ref 96–106)
Creatinine, Ser: 1.21 mg/dL — ABNORMAL HIGH (ref 0.57–1.00)
GFR calc Af Amer: 47 mL/min/{1.73_m2} — ABNORMAL LOW (ref >59)
GFR calc non Af Amer: 41 mL/min/{1.73_m2} — ABNORMAL LOW (ref >59)
Glucose: 122 mg/dL — ABNORMAL HIGH (ref 65–99)
Phosphorus: 4.3 mg/dL (ref 3.0–4.3)
Potassium: 4 mmol/L (ref 3.5–5.2)
Sodium: 141 mmol/L (ref 134–144)

## 2019-01-19 LAB — IRON,TIBC AND FERRITIN PANEL
Ferritin: 22 ng/mL (ref 15–150)
Iron Saturation: 8 % — CL (ref 15–55)
Iron: 27 ug/dL (ref 27–139)
Total Iron Binding Capacity: 359 ug/dL (ref 250–450)
UIBC: 332 ug/dL (ref 118–369)

## 2019-01-19 LAB — CBC WITH DIFFERENTIAL/PLATELET
Basophils Absolute: 0 10*3/uL (ref 0.0–0.2)
Basos: 1 %
EOS (ABSOLUTE): 0.2 10*3/uL (ref 0.0–0.4)
Eos: 2 %
Hematocrit: 24.7 % — ABNORMAL LOW (ref 34.0–46.6)
Hemoglobin: 7.7 g/dL — ABNORMAL LOW (ref 11.1–15.9)
Immature Grans (Abs): 0.1 10*3/uL (ref 0.0–0.1)
Immature Granulocytes: 1 %
Lymphocytes Absolute: 1.5 10*3/uL (ref 0.7–3.1)
Lymphs: 20 %
MCH: 26.8 pg (ref 26.6–33.0)
MCHC: 31.2 g/dL — ABNORMAL LOW (ref 31.5–35.7)
MCV: 86 fL (ref 79–97)
Monocytes Absolute: 0.4 10*3/uL (ref 0.1–0.9)
Monocytes: 5 %
Neutrophils Absolute: 5.3 10*3/uL (ref 1.4–7.0)
Neutrophils: 71 %
Platelets: 202 10*3/uL (ref 150–450)
RBC: 2.87 x10E6/uL — ABNORMAL LOW (ref 3.77–5.28)
RDW: 16.2 % — ABNORMAL HIGH (ref 11.7–15.4)
WBC: 7.5 10*3/uL (ref 3.4–10.8)

## 2019-01-19 NOTE — Telephone Encounter (Signed)
Patient is returning call in regards to her lab results. KW

## 2019-01-22 ENCOUNTER — Other Ambulatory Visit: Payer: Self-pay

## 2019-01-22 ENCOUNTER — Encounter: Payer: Self-pay | Admitting: Family Medicine

## 2019-01-22 ENCOUNTER — Ambulatory Visit (INDEPENDENT_AMBULATORY_CARE_PROVIDER_SITE_OTHER): Payer: Medicare Other | Admitting: Family Medicine

## 2019-01-22 VITALS — BP 142/60 | HR 86 | Temp 98.4°F | Resp 20 | Wt 169.0 lb

## 2019-01-22 DIAGNOSIS — R002 Palpitations: Secondary | ICD-10-CM | POA: Diagnosis not present

## 2019-01-22 DIAGNOSIS — D649 Anemia, unspecified: Secondary | ICD-10-CM

## 2019-01-22 DIAGNOSIS — F418 Other specified anxiety disorders: Secondary | ICD-10-CM

## 2019-01-22 DIAGNOSIS — E785 Hyperlipidemia, unspecified: Secondary | ICD-10-CM | POA: Diagnosis not present

## 2019-01-22 DIAGNOSIS — I10 Essential (primary) hypertension: Secondary | ICD-10-CM

## 2019-01-22 NOTE — Telephone Encounter (Signed)
Pt has an appt this morning, will advise at that time.

## 2019-01-22 NOTE — Progress Notes (Signed)
Patient: Stephanie Charles Female    DOB: 11/30/1929   85 y.o.   MRN: [id number] Visit Date: 01/22/2019  Today's Provider: [name], MD   Chief Complaint  Patient presents with  . Follow-up  . Anemia   Subjective:     HPI Patient comes in today to follow up on labs. She was seen in the office on 01/18/2019, and labs indicated that iron levels were very low. Patient reports that she has started back on her iron supplement. She does feel about the same as she did last week, but no new issues.  She has had no further chest pain.  She did not get the Protonix yet but this ended last week.  She is wondering about seeing cardiology as she woke up last night and her heart was pounding in her chest.  Chest pain no shortness of breath no other symptoms.  It lasted a few minutes.  No syncope or presyncope. No dyspnea on exertion or diaphoresis. Iron/TIBC/Ferritin/ Sat    Component Value Date/Time   IRON 27 01/18/2019 1149   TIBC 359 01/18/2019 1149   FERRITIN 22 01/18/2019 1149   IRONPCTSAT 8 (LL) 01/18/2019 1149    Allergies  Allergen Reactions  . Levofloxacin     Mouth sores  . Lisinopril     Cough?  . Povidone-Iodine Other (See Comments)    Severe blistering and itchiness. Severe redness  . Simvastatin     Myalgias  . Codeine Nausea And Vomiting     Current Outpatient Medications:  .  allopurinol (ZYLOPRIM) 300 MG tablet, TAKE 1 TABLET BY MOUTH ONCE DAILY, Disp: 90 tablet, Rfl: 3 .  amitriptyline (ELAVIL) 10 MG tablet, Take 1 tablet (10 mg total) by mouth at bedtime., Disp: 90 tablet, Rfl: 3 .  amitriptyline (ELAVIL) 10 MG tablet, TAKE 2 TABLETS BY MOUTH AT BEDTIME, Disp: 60 tablet, Rfl: 12 .  amLODipine (NORVASC) 5 MG tablet, Take 1 tablet by mouth once daily, Disp: 30 tablet, Rfl: 0 .  aspirin 81 MG tablet, Take 81 mg by mouth daily. , Disp: , Rfl:  .  atorvastatin (LIPITOR) 40 MG tablet, TAKE 1 TABLET BY MOUTH ONCE DAILY, Disp: 90 tablet, Rfl: 3 .  carvedilol  (COREG) 6.25 MG tablet, Take 1 tablet (6.25 mg total) by mouth 2 (two) times daily., Disp: 180 tablet, Rfl: 3 .  clopidogrel (PLAVIX) 75 MG tablet, TAKE 1 TABLET BY MOUTH ONCE DAILY, Disp: 90 tablet, Rfl: 3 .  fluticasone (FLONASE) 50 MCG/ACT nasal spray, USE 2 SPRAY(S) IN EACH NOSTRIL ONCE DAILY, Disp: 16 g, Rfl: 11 .  hydrALAZINE (APRESOLINE) 25 MG tablet, TAKE 1 TABLET BY MOUTH THREE TIMES DAILY, Disp: 270 tablet, Rfl: 3 .  hydrochlorothiazide (HYDRODIURIL) 25 MG tablet, TAKE 1 TABLET BY MOUTH ONCE DAILY, Disp: 90 tablet, Rfl: 3 .  isosorbide mononitrate (IMDUR) 30 MG 24 hr tablet, Take 1 tablet (30 mg total) by mouth every morning., Disp: 30 tablet, Rfl: 12 .  levothyroxine (SYNTHROID, LEVOTHROID) 137 MCG tablet, TAKE 1 TABLET BY MOUTH ONCE DAILY, Disp: 90 tablet, Rfl: 0 .  losartan (COZAAR) 100 MG tablet, TAKE 1 TABLET BY MOUTH ONCE DAILY, Disp: 90 tablet, Rfl: 3 .  Melatonin 3 MG TBDP, Take 1 tablet by mouth at bedtime., Disp: , Rfl:  .  mirtazapine (REMERON) 30 MG tablet, Take 1 tablet (30 mg total) by mouth at bedtime., Disp: 90 tablet, Rfl: 3 .  Multiple Vitamin (MULTIVITAMIN) capsule, Take 1  capsule by mouth daily., Disp: , Rfl:  .  pantoprazole (PROTONIX) 40 MG tablet, Take 1 tablet (40 mg total) by mouth daily., Disp: 30 tablet, Rfl: 5 .  sertraline (ZOLOFT) 25 MG tablet, Take 1 tablet by mouth once daily, Disp: 90 tablet, Rfl: 0 .  albuterol (PROVENTIL) (2.5 MG/3ML) 0.083% nebulizer solution, Take 2.5 mg by nebulization every 4 (four) hours as needed. , Disp: , Rfl:   Review of Systems  Constitutional: Negative for activity change, appetite change, chills, diaphoresis, fever and unexpected weight change.  HENT: Negative.   Eyes: Negative.   Respiratory: Negative for cough and shortness of breath.   Cardiovascular: Positive for palpitations. Negative for chest pain and leg swelling.  Gastrointestinal: Negative.   Endocrine: Negative.   Allergic/Immunologic: Negative.    Neurological: Negative for headaches.  Psychiatric/Behavioral: The patient is nervous/anxious.     Social History   Tobacco Use  . Smoking status: Never Smoker  . Smokeless tobacco: Never Used  Substance Use Topics  . Alcohol use: No    Alcohol/week: 0.0 standard drinks      Objective:   BP (!) 142/60 (BP Location: Right Arm, Patient Position: Sitting, Cuff Size: Normal)   Pulse 86   Temp 98.4 F (36.9 C)   Resp 20   Wt 169 lb (76.7 kg)   SpO2 98%   BMI 29.94 kg/m  Vitals:   01/22/19 1125  BP: (!) 142/60  Pulse: 86  Resp: 20  Temp: 98.4 F (36.9 C)  SpO2: 98%  Weight: 169 lb (76.7 kg)     Physical Exam Vitals signs reviewed.  Constitutional:      Appearance: Normal appearance.  HENT:     Right Ear: External ear normal.     Left Ear: External ear normal.     Nose: Nose normal.  Eyes:     General: No scleral icterus.    Comments: Conjunctiva slightly pale.  Cardiovascular:     Rate and Rhythm: Normal rate and regular rhythm.     Heart sounds: Normal heart sounds.  Pulmonary:     Effort: Pulmonary effort is normal.     Breath sounds: Normal breath sounds.  Abdominal:     Palpations: Abdomen is soft.     Tenderness: There is no abdominal tenderness.  Lymphadenopathy:     Cervical: No cervical adenopathy.  Skin:    General: Skin is warm and dry.  Neurological:     General: No focal deficit present.     Mental Status: She is alert and oriented to person, place, and time.  Psychiatric:        Mood and Affect: Mood normal.        Behavior: Behavior normal.        Thought Content: Thought content normal.        Judgment: Judgment normal.         Assessment & Plan    1. Anemia, unspecified type Patient advised to start metoprolol daily as prescribed.  Turn to clinic 2 weeks.  Hemoglobin below 7 will need further evaluation per GI or with transfusion at hospital - CBC with Differential/Platelet - Fe+TIBC+Fer  2. Hyperlipidemia, unspecified  hyperlipidemia type   3. Benign essential HTN Better controlled.  4. Anxiety about health   5. Palpitations Discussed with patient that I am happy to refer to cardiology but I do not think she is talking about a peer cardiac problem.  She will talk with her family about it.  She has  a cardiologist in [facility] that she would like to see if that is the case.      Cranford Mon, MD  [facility]

## 2019-01-23 LAB — CBC WITH DIFFERENTIAL/PLATELET
Basophils Absolute: 0 10*3/uL (ref 0.0–0.2)
Basos: 1 %
EOS (ABSOLUTE): 0.2 10*3/uL (ref 0.0–0.4)
Eos: 2 %
Hematocrit: 27.4 % — ABNORMAL LOW (ref 34.0–46.6)
Hemoglobin: 8.6 g/dL — ABNORMAL LOW (ref 11.1–15.9)
Immature Grans (Abs): 0.1 10*3/uL (ref 0.0–0.1)
Immature Granulocytes: 1 %
Lymphocytes Absolute: 1.4 10*3/uL (ref 0.7–3.1)
Lymphs: 17 %
MCH: 27.1 pg (ref 26.6–33.0)
MCHC: 31.4 g/dL — ABNORMAL LOW (ref 31.5–35.7)
MCV: 86 fL (ref 79–97)
Monocytes Absolute: 0.4 10*3/uL (ref 0.1–0.9)
Monocytes: 5 %
Neutrophils Absolute: 6.3 10*3/uL (ref 1.4–7.0)
Neutrophils: 74 %
Platelets: 206 10*3/uL (ref 150–450)
RBC: 3.17 x10E6/uL — ABNORMAL LOW (ref 3.77–5.28)
RDW: 17.6 % — ABNORMAL HIGH (ref 11.7–15.4)
WBC: 8.4 10*3/uL (ref 3.4–10.8)

## 2019-01-23 LAB — IRON,TIBC AND FERRITIN PANEL
Ferritin: 26 ng/mL (ref 15–150)
Iron Saturation: 7 % — CL (ref 15–55)
Iron: 24 ug/dL — ABNORMAL LOW (ref 27–139)
Total Iron Binding Capacity: 363 ug/dL (ref 250–450)
UIBC: 339 ug/dL (ref 118–369)

## 2019-02-06 ENCOUNTER — Ambulatory Visit: Payer: Self-pay | Admitting: Family Medicine

## 2019-02-07 ENCOUNTER — Encounter: Payer: Self-pay | Admitting: Family Medicine

## 2019-02-07 ENCOUNTER — Other Ambulatory Visit: Payer: Self-pay

## 2019-02-07 ENCOUNTER — Ambulatory Visit (INDEPENDENT_AMBULATORY_CARE_PROVIDER_SITE_OTHER): Payer: Medicare Other | Admitting: Family Medicine

## 2019-02-07 VITALS — BP 140/76 | HR 60 | Temp 98.5°F | Resp 16 | Ht 62.0 in | Wt 168.0 lb

## 2019-02-07 DIAGNOSIS — I6529 Occlusion and stenosis of unspecified carotid artery: Secondary | ICD-10-CM | POA: Diagnosis not present

## 2019-02-07 DIAGNOSIS — I10 Essential (primary) hypertension: Secondary | ICD-10-CM

## 2019-02-07 DIAGNOSIS — K5520 Angiodysplasia of colon without hemorrhage: Secondary | ICD-10-CM

## 2019-02-07 DIAGNOSIS — E785 Hyperlipidemia, unspecified: Secondary | ICD-10-CM

## 2019-02-07 DIAGNOSIS — F3341 Major depressive disorder, recurrent, in partial remission: Secondary | ICD-10-CM

## 2019-02-07 DIAGNOSIS — D649 Anemia, unspecified: Secondary | ICD-10-CM

## 2019-02-07 NOTE — Progress Notes (Signed)
Patient: Stephanie Charles Female    DOB: 1930/09/25   85 y.o.   MRN: [id number] Visit Date: 02/07/2019  Today's Provider: [name], MD   Chief Complaint  Patient presents with  . Anemia   Subjective:     HPI Patient comes in today for a follow up. She was last seen in the office 2 weeks ago. No medications were changed. However patient's iron levels were really low. Patient reports that she has been taking her iron supplement as directed.  She is slowly getting strength back. CBC Latest Ref Rng & Units 01/22/2019 01/18/2019 01/09/2019  WBC 3.4 - 10.8 x10E3/uL 8.4 7.5 6.9  Hemoglobin 11.1 - 15.9 g/dL 8.6(L) 7.7(L) 8.0(L)  Hematocrit 34.0 - 46.6 % 27.4(L) 24.7(L) 25.9(L)  Platelets 150 - 450 x10E3/uL 206 202 190   Iron/TIBC/Ferritin/Sat    Component Value Date/Time   IRON 24 (L) 01/22/2019 1200   TIBC 363 01/22/2019 1200   FERRITIN 26 01/22/2019 1200   IRONPCTSAT 7 (LL) 01/22/2019 1200    Allergies  Allergen Reactions  . Levofloxacin     Mouth sores  . Lisinopril     Cough?  . Povidone-Iodine Other (See Comments)    Severe blistering and itchiness. Severe redness  . Simvastatin     Myalgias  . Codeine Nausea And Vomiting     Current Outpatient Medications:  .  allopurinol (ZYLOPRIM) 300 MG tablet, TAKE 1 TABLET BY MOUTH ONCE DAILY, Disp: 90 tablet, Rfl: 3 .  amitriptyline (ELAVIL) 10 MG tablet, Take 1 tablet (10 mg total) by mouth at bedtime., Disp: 90 tablet, Rfl: 3 .  amLODipine (NORVASC) 5 MG tablet, Take 1 tablet by mouth once daily, Disp: 30 tablet, Rfl: 0 .  aspirin 81 MG tablet, Take 81 mg by mouth daily. , Disp: , Rfl:  .  atorvastatin (LIPITOR) 40 MG tablet, TAKE 1 TABLET BY MOUTH ONCE DAILY, Disp: 90 tablet, Rfl: 3 .  carvedilol (COREG) 6.25 MG tablet, Take 1 tablet (6.25 mg total) by mouth 2 (two) times daily., Disp: 180 tablet, Rfl: 3 .  clopidogrel (PLAVIX) 75 MG tablet, TAKE 1 TABLET BY MOUTH ONCE DAILY, Disp: 90 tablet, Rfl: 3 .   fluticasone (FLONASE) 50 MCG/ACT nasal spray, USE 2 SPRAY(S) IN EACH NOSTRIL ONCE DAILY, Disp: 16 g, Rfl: 11 .  hydrALAZINE (APRESOLINE) 25 MG tablet, TAKE 1 TABLET BY MOUTH THREE TIMES DAILY, Disp: 270 tablet, Rfl: 3 .  hydrochlorothiazide (HYDRODIURIL) 25 MG tablet, TAKE 1 TABLET BY MOUTH ONCE DAILY, Disp: 90 tablet, Rfl: 3 .  isosorbide mononitrate (IMDUR) 30 MG 24 hr tablet, Take 1 tablet (30 mg total) by mouth every morning., Disp: 30 tablet, Rfl: 12 .  levothyroxine (SYNTHROID, LEVOTHROID) 137 MCG tablet, TAKE 1 TABLET BY MOUTH ONCE DAILY, Disp: 90 tablet, Rfl: 0 .  losartan (COZAAR) 100 MG tablet, TAKE 1 TABLET BY MOUTH ONCE DAILY, Disp: 90 tablet, Rfl: 3 .  Melatonin 3 MG TBDP, Take 1 tablet by mouth at bedtime., Disp: , Rfl:  .  mirtazapine (REMERON) 30 MG tablet, Take 1 tablet (30 mg total) by mouth at bedtime., Disp: 90 tablet, Rfl: 3 .  Multiple Vitamin (MULTIVITAMIN) capsule, Take 1 capsule by mouth daily., Disp: , Rfl:  .  sertraline (ZOLOFT) 25 MG tablet, Take 1 tablet by mouth once daily, Disp: 90 tablet, Rfl: 0 .  albuterol (PROVENTIL) (2.5 MG/3ML) 0.083% nebulizer solution, Take 2.5 mg by nebulization every 4 (four) hours as needed. ,  Disp: , Rfl:  .  amitriptyline (ELAVIL) 10 MG tablet, TAKE 2 TABLETS BY MOUTH AT BEDTIME, Disp: 60 tablet, Rfl: 12 .  pantoprazole (PROTONIX) 40 MG tablet, Take 1 tablet (40 mg total) by mouth daily. (Patient not taking: Reported on 02/07/2019), Disp: 30 tablet, Rfl: 5  Review of Systems  Constitutional: Negative for activity change, appetite change, chills, diaphoresis, fatigue, fever and unexpected weight change.  HENT: Negative.   Eyes: Negative.   Respiratory: Negative for cough and shortness of breath.   Cardiovascular: Negative for chest pain and leg swelling.  Gastrointestinal: Negative for abdominal pain, blood in stool, constipation, diarrhea, nausea, rectal pain and vomiting.  Endocrine: Negative.   Allergic/Immunologic: Negative.    Neurological: Negative for dizziness, light-headedness and headaches.  Psychiatric/Behavioral: Negative for agitation, self-injury, sleep disturbance and suicidal ideas. The patient is not nervous/anxious and is not hyperactive.     Social History   Tobacco Use  . Smoking status: Never Smoker  . Smokeless tobacco: Never Used  Substance Use Topics  . Alcohol use: No    Alcohol/week: 0.0 standard drinks      Objective:   BP 140/76 (BP Location: Right Arm, Patient Position: Sitting, Cuff Size: Normal)   Pulse 60   Temp 98.5 F (36.9 C)   Resp 16   Ht 5\' 2"  (1.575 m)   Wt 168 lb (76.2 kg)   SpO2 95%   BMI 30.73 kg/m  Vitals:   02/07/19 1336  BP: 140/76  Pulse: 60  Resp: 16  Temp: 98.5 F (36.9 C)  SpO2: 95%  Weight: 168 lb (76.2 kg)  Height: 5\' 2"  (1.575 m)     Physical Exam Vitals signs reviewed.  Constitutional:      Appearance: Normal appearance.  HENT:     Right Ear: External ear normal.     Left Ear: External ear normal.     Nose: Nose normal.  Eyes:     General: No scleral icterus.    Comments: Conjunctiva slightly pale.  Cardiovascular:     Rate and Rhythm: Normal rate and regular rhythm.     Heart sounds: Normal heart sounds.  Pulmonary:     Effort: Pulmonary effort is normal.     Breath sounds: Normal breath sounds.  Abdominal:     Palpations: Abdomen is soft.     Tenderness: There is no abdominal tenderness.  Lymphadenopathy:     Cervical: No cervical adenopathy.  Skin:    General: Skin is warm and dry.  Neurological:     General: No focal deficit present.     Mental Status: She is alert and oriented to person, place, and time.  Psychiatric:        Mood and Affect: Mood normal.        Behavior: Behavior normal.        Thought Content: Thought content normal.        Judgment: Judgment normal.         Assessment & Plan    1. Anemia, unspecified type RTC 1 month if rising Hgb. - CBC with Differential/Platelet - Fe+TIBC+Fer  2. AVM  (arteriovenous malformation) of colon May need GI referral if hgb drops for possible camera endoscopy.  3. Hyperlipidemia, unspecified hyperlipidemia type   4. Recurrent major depressive disorder, in partial remission ([place])   5. External carotid artery stenosis   6. Benign essential HTN Controlled.     Richard Cranford Mon, MD  [facility]

## 2019-02-08 LAB — IRON,TIBC AND FERRITIN PANEL
Ferritin: 34 ng/mL (ref 15–150)
Iron Saturation: 9 % — CL (ref 15–55)
Iron: 31 ug/dL (ref 27–139)
Total Iron Binding Capacity: 349 ug/dL (ref 250–450)
UIBC: 318 ug/dL (ref 118–369)

## 2019-02-08 LAB — CBC WITH DIFFERENTIAL/PLATELET
Basophils Absolute: 0.1 10*3/uL (ref 0.0–0.2)
Basos: 1 %
EOS (ABSOLUTE): 0.2 10*3/uL (ref 0.0–0.4)
Eos: 2 %
Hematocrit: 30.5 % — ABNORMAL LOW (ref 34.0–46.6)
Hemoglobin: 9.7 g/dL — ABNORMAL LOW (ref 11.1–15.9)
Immature Grans (Abs): 0 10*3/uL (ref 0.0–0.1)
Immature Granulocytes: 1 %
Lymphocytes Absolute: 1.7 10*3/uL (ref 0.7–3.1)
Lymphs: 23 %
MCH: 27.2 pg (ref 26.6–33.0)
MCHC: 31.8 g/dL (ref 31.5–35.7)
MCV: 86 fL (ref 79–97)
Monocytes Absolute: 0.5 10*3/uL (ref 0.1–0.9)
Monocytes: 6 %
Neutrophils Absolute: 5 10*3/uL (ref 1.4–7.0)
Neutrophils: 67 %
Platelets: 201 10*3/uL (ref 150–450)
RBC: 3.56 x10E6/uL — ABNORMAL LOW (ref 3.77–5.28)
RDW: 17.7 % — ABNORMAL HIGH (ref 11.7–15.4)
WBC: 7.4 10*3/uL (ref 3.4–10.8)

## 2019-02-22 ENCOUNTER — Other Ambulatory Visit: Payer: Self-pay | Admitting: Family Medicine

## 2019-03-07 ENCOUNTER — Other Ambulatory Visit: Payer: Self-pay | Admitting: Family Medicine

## 2019-03-07 DIAGNOSIS — I10 Essential (primary) hypertension: Secondary | ICD-10-CM

## 2019-03-07 DIAGNOSIS — F3289 Other specified depressive episodes: Secondary | ICD-10-CM

## 2019-03-12 ENCOUNTER — Other Ambulatory Visit: Payer: Self-pay

## 2019-03-12 ENCOUNTER — Ambulatory Visit (INDEPENDENT_AMBULATORY_CARE_PROVIDER_SITE_OTHER): Payer: Medicare Other | Admitting: Family Medicine

## 2019-03-12 ENCOUNTER — Encounter: Payer: Self-pay | Admitting: Family Medicine

## 2019-03-12 VITALS — BP 149/69 | HR 72 | Temp 98.4°F | Resp 16 | Wt 167.0 lb

## 2019-03-12 DIAGNOSIS — D649 Anemia, unspecified: Secondary | ICD-10-CM

## 2019-03-12 DIAGNOSIS — F3341 Major depressive disorder, recurrent, in partial remission: Secondary | ICD-10-CM

## 2019-03-12 DIAGNOSIS — I10 Essential (primary) hypertension: Secondary | ICD-10-CM

## 2019-03-12 NOTE — Progress Notes (Signed)
Patient: Stephanie Charles Female    DOB: 04-15-1930   85 y.o.   MRN: [id number] Visit Date: 03/12/2019  Today's Provider: [name], MD   Chief Complaint  Patient presents with  . Anemia   Subjective:     Anemia  Presents for follow-up visit. Symptoms include leg swelling. There has been no abdominal pain, anorexia, bruising/bleeding easily, confusion, fever, light-headedness, malaise/fatigue, pallor, palpitations, paresthesias, pica or weight loss. Compliance with medications is 76-100%.  She feels a good bit better.She wonders if she needs to see GI again.  Allergies  Allergen Reactions  . Levofloxacin     Mouth sores  . Lisinopril     Cough?  . Povidone-Iodine Other (See Comments)    Severe blistering and itchiness. Severe redness  . Simvastatin     Myalgias  . Codeine Nausea And Vomiting     Current Outpatient Medications:  .  allopurinol (ZYLOPRIM) 300 MG tablet, TAKE 1 TABLET BY MOUTH ONCE DAILY, Disp: 90 tablet, Rfl: 3 .  amitriptyline (ELAVIL) 10 MG tablet, TAKE 2 TABLETS BY MOUTH AT BEDTIME, Disp: 60 tablet, Rfl: 12 .  amLODipine (NORVASC) 5 MG tablet, Take 1 tablet by mouth once daily, Disp: 90 tablet, Rfl: 3 .  aspirin 81 MG tablet, Take 81 mg by mouth daily. , Disp: , Rfl:  .  atorvastatin (LIPITOR) 40 MG tablet, TAKE 1 TABLET BY MOUTH ONCE DAILY, Disp: 90 tablet, Rfl: 3 .  carvedilol (COREG) 6.25 MG tablet, Take 1 tablet (6.25 mg total) by mouth 2 (two) times daily., Disp: 180 tablet, Rfl: 3 .  clopidogrel (PLAVIX) 75 MG tablet, Take 1 tablet by mouth once daily, Disp: 90 tablet, Rfl: 0 .  fluticasone (FLONASE) 50 MCG/ACT nasal spray, USE 2 SPRAY(S) IN EACH NOSTRIL ONCE DAILY, Disp: 16 g, Rfl: 11 .  hydrALAZINE (APRESOLINE) 25 MG tablet, TAKE 1 TABLET BY MOUTH THREE TIMES DAILY, Disp: 270 tablet, Rfl: 3 .  hydrochlorothiazide (HYDRODIURIL) 25 MG tablet, TAKE 1 TABLET BY MOUTH ONCE DAILY, Disp: 90 tablet, Rfl: 3 .  isosorbide mononitrate (IMDUR) 30  MG 24 hr tablet, Take 1 tablet (30 mg total) by mouth every morning., Disp: 30 tablet, Rfl: 12 .  levothyroxine (SYNTHROID) 137 MCG tablet, Take 1 tablet by mouth once daily, Disp: 90 tablet, Rfl: 0 .  losartan (COZAAR) 100 MG tablet, TAKE 1 TABLET BY MOUTH ONCE DAILY, Disp: 90 tablet, Rfl: 3 .  Melatonin 3 MG TBDP, Take 1 tablet by mouth at bedtime., Disp: , Rfl:  .  mirtazapine (REMERON) 30 MG tablet, Take 1 tablet (30 mg total) by mouth at bedtime., Disp: 90 tablet, Rfl: 3 .  Multiple Vitamin (MULTIVITAMIN) capsule, Take 1 capsule by mouth daily., Disp: , Rfl:  .  pantoprazole (PROTONIX) 40 MG tablet, Take 1 tablet (40 mg total) by mouth daily., Disp: 30 tablet, Rfl: 5 .  sertraline (ZOLOFT) 25 MG tablet, Take 1 tablet by mouth once daily, Disp: 90 tablet, Rfl: 3 .  albuterol (PROVENTIL) (2.5 MG/3ML) 0.083% nebulizer solution, Take 2.5 mg by nebulization every 4 (four) hours as needed. , Disp: , Rfl:  .  amitriptyline (ELAVIL) 10 MG tablet, Take 1 tablet (10 mg total) by mouth at bedtime. (Patient not taking: Reported on 03/12/2019), Disp: 90 tablet, Rfl: 3  Review of Systems  Constitutional: Negative for activity change, appetite change, chills, diaphoresis, fatigue, fever, malaise/fatigue, unexpected weight change and weight loss.  HENT: Negative.   Eyes: Negative.  Respiratory: Negative for cough and shortness of breath.   Cardiovascular: Negative for chest pain, palpitations and leg swelling.  Gastrointestinal: Positive for diarrhea. Negative for abdominal pain, anorexia, blood in stool, constipation, nausea, rectal pain and vomiting.  Endocrine: Negative.   Skin: Negative for pallor.  Allergic/Immunologic: Negative.   Neurological: Negative for dizziness, light-headedness, headaches and paresthesias.  Hematological: Does not bruise/bleed easily.  Psychiatric/Behavioral: Negative for agitation, confusion, self-injury, sleep disturbance and suicidal ideas. The patient is not  nervous/anxious and is not hyperactive.   All other systems reviewed and are negative.   Social History   Tobacco Use  . Smoking status: Never Smoker  . Smokeless tobacco: Never Used  Substance Use Topics  . Alcohol use: No    Alcohol/week: 0.0 standard drinks      Objective:   BP (!) 149/69   Pulse 72   Temp 98.4 F (36.9 C) (Oral)   Resp 16   Wt 167 lb (75.8 kg)   BMI 30.54 kg/m  Vitals:   03/12/19 1436  BP: (!) 149/69  Pulse: 72  Resp: 16  Temp: 98.4 F (36.9 C)  TempSrc: Oral  Weight: 167 lb (75.8 kg)     Physical Exam Vitals signs reviewed.  Constitutional:      Appearance: Normal appearance.  HENT:     Right Ear: External ear normal.     Left Ear: External ear normal.     Nose: Nose normal.  Eyes:     General: No scleral icterus.    Comments: Conjunctiva slightly pale.  Cardiovascular:     Rate and Rhythm: Normal rate and regular rhythm.     Heart sounds: Normal heart sounds.  Pulmonary:     Effort: Pulmonary effort is normal.     Breath sounds: Normal breath sounds.  Abdominal:     Palpations: Abdomen is soft.     Tenderness: There is no abdominal tenderness.  Lymphadenopathy:     Cervical: No cervical adenopathy.  Skin:    General: Skin is warm and dry.  Neurological:     General: No focal deficit present.     Mental Status: She is alert and oriented to person, place, and time.  Psychiatric:        Mood and Affect: Mood normal.        Behavior: Behavior normal.        Thought Content: Thought content normal.        Judgment: Judgment normal.         Assessment & Plan    1. Anemia, Iron deficient Continue daily iron refer back to Gi--possible capsule endoscopy. - CBC with Differential/Platelet - Iron, TIBC and Ferritin Panel - Ambulatory referral to Gastroenterology  2. Benign essential HTN Controlled.  3. Recurrent major depressive disorder, in partial remission ([place]) Doing well.     Richard Cranford Mon, MD  [facility] Fritzi Mandes [place] Spreen,acting as a scribe for [name], MD.,have documented all relevant documentation on the behalf of [name], MD,as directed by  [name], MD while in the presence of [name], MD.

## 2019-03-13 LAB — CBC WITH DIFFERENTIAL/PLATELET
Basophils Absolute: 0.1 10*3/uL (ref 0.0–0.2)
Basos: 1 %
EOS (ABSOLUTE): 0.2 10*3/uL (ref 0.0–0.4)
Eos: 2 %
Hematocrit: 34.3 % (ref 34.0–46.6)
Hemoglobin: 10.9 g/dL — ABNORMAL LOW (ref 11.1–15.9)
Immature Grans (Abs): 0 10*3/uL (ref 0.0–0.1)
Immature Granulocytes: 0 %
Lymphocytes Absolute: 2.1 10*3/uL (ref 0.7–3.1)
Lymphs: 21 %
MCH: 27.5 pg (ref 26.6–33.0)
MCHC: 31.8 g/dL (ref 31.5–35.7)
MCV: 86 fL (ref 79–97)
Monocytes Absolute: 0.7 10*3/uL (ref 0.1–0.9)
Monocytes: 6 %
Neutrophils Absolute: 7.2 10*3/uL — ABNORMAL HIGH (ref 1.4–7.0)
Neutrophils: 70 %
Platelets: 201 10*3/uL (ref 150–450)
RBC: 3.97 x10E6/uL (ref 3.77–5.28)
RDW: 16.9 % — ABNORMAL HIGH (ref 11.7–15.4)
WBC: 10.3 10*3/uL (ref 3.4–10.8)

## 2019-03-13 LAB — IRON,TIBC AND FERRITIN PANEL
Ferritin: 18 ng/mL (ref 15–150)
Iron Saturation: 7 % — CL (ref 15–55)
Iron: 28 ug/dL (ref 27–139)
Total Iron Binding Capacity: 378 ug/dL (ref 250–450)
UIBC: 350 ug/dL (ref 118–369)

## 2019-03-14 ENCOUNTER — Telehealth: Payer: Self-pay

## 2019-03-14 NOTE — Telephone Encounter (Signed)
Patient advised as below.  

## 2019-03-14 NOTE — Telephone Encounter (Signed)
-----   Message from Jerrol Banana., MD sent at 03/13/2019  3:09 PM EDT ----- Improving--stay on iron.

## 2019-03-15 ENCOUNTER — Encounter: Payer: Self-pay | Admitting: *Deleted

## 2019-03-15 ENCOUNTER — Telehealth: Payer: Self-pay

## 2019-03-15 ENCOUNTER — Telehealth: Payer: Self-pay | Admitting: Gastroenterology

## 2019-03-15 NOTE — Telephone Encounter (Signed)
Am ok with the change

## 2019-03-15 NOTE — Telephone Encounter (Signed)
Pt is scheduled for Procedure with Dr. Bonna Gains for 06/14/19 and she isnt sure why she is scheduled for that apt please advise

## 2019-03-15 NOTE — Telephone Encounter (Signed)
LVM informing patient that she may keep her appt as scheduled with Dr. Bonna Gains.  Thanks [employer]

## 2019-03-15 NOTE — Telephone Encounter (Signed)
Contacted patient to let her know that she does not have a procedure scheduled. She said Dr. Rosanna Randy advised her to follow up with [name] for her capsule endoscopy to be done.    Stephanie Charles has scheduled patient with Dr. Bonna Gains for a virtual visit.  Patient was last seen in the office by Dr. Vicente Males 07/11/18.  Patient informed Stephanie Charles that she wants to go to [facility], I explained to her that Dr. Vicente Males does not go to [facility] and she is fine with seeing Dr. Bonna Gains.  This message is to make sure Dr. Vicente Males and Dr. Bonna Gains are okay with the physician change.  Thanks [employer]

## 2019-03-20 ENCOUNTER — Encounter: Payer: Self-pay | Admitting: Gastroenterology

## 2019-03-20 ENCOUNTER — Other Ambulatory Visit: Payer: Self-pay

## 2019-03-20 ENCOUNTER — Ambulatory Visit (INDEPENDENT_AMBULATORY_CARE_PROVIDER_SITE_OTHER): Payer: Medicare Other | Admitting: Gastroenterology

## 2019-03-20 DIAGNOSIS — D509 Iron deficiency anemia, unspecified: Secondary | ICD-10-CM

## 2019-03-20 NOTE — Progress Notes (Signed)
Vonda Antigua, MD [address]  [place]  [place], [place] [address]  Main: [phone]  Fax: [phone]   Primary Care Physician: Jerrol Banana., MD  Virtual Visit via Telephone Note  I connected with patient on 03/20/19 at 10:30 AM EDT by telephone and verified that I am speaking with the correct person using two identifiers.   I discussed the limitations, risks, security and privacy concerns of performing an evaluation and management service by telephone and the availability of in person appointments. I also discussed with the patient that there may be a patient responsible charge related to this service. The patient expressed understanding and agreed to proceed.  Location of Patient: Home Location of Provider: Home Persons involved: Patient and provider only during the visit (nursing staff and front desk staff was involved in communicating with the patient prior to the appointment, reviewing medications and checking them in)   History of Present Illness: Chief Complaint  Patient presents with  . Follow-up    IDA     HPI: Stephanie Charles is a 85 y.o. female here for follow-up of iron deficiency anemia.  Patient on iron replacement by primary care provider.  Hemoglobin overall improving, but patient continues to have iron deficiency. The patient denies abdominal or flank pain, anorexia, nausea or vomiting, dysphagia, change in bowel habits or black or bloody stools or weight loss.  Small bowel capsule study was scheduled previously by Dr. Vicente Males, but for some reason was canceled.  Patient does not remember why it was canceled.  Patient denies any sources of active bleeding.  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 allopurinol (ZYLOPRIM) 300 MG tablet TAKE 1 TABLET BY MOUTH ONCE DAILY 90 tablet 3  . amitriptyline (ELAVIL) 10 MG tablet TAKE 2 TABLETS BY MOUTH AT BEDTIME 60 tablet 12  . amLODipine (NORVASC) 5 MG tablet Take 1 tablet by mouth once daily 90 tablet 3  . aspirin 81 MG tablet Take 81 mg by mouth daily.     Marland Kitchen atorvastatin (LIPITOR) 40 MG tablet TAKE 1 TABLET BY MOUTH ONCE DAILY 90 tablet 3  . carvedilol (COREG) 6.25 MG tablet Take 1 tablet (6.25 mg total) by mouth 2 (two) times daily. 180 tablet 3  . clopidogrel (PLAVIX) 75 MG tablet Take 1 tablet by mouth once daily 90 tablet 0  . fluticasone (FLONASE) 50 MCG/ACT nasal spray USE 2 SPRAY(S) IN EACH NOSTRIL ONCE DAILY 16 g 11  . hydrALAZINE (APRESOLINE) 25 MG tablet TAKE 1 TABLET BY MOUTH THREE TIMES DAILY 270 tablet 3  . hydrochlorothiazide (HYDRODIURIL) 25 MG tablet TAKE 1 TABLET BY MOUTH ONCE DAILY 90 tablet 3  . isosorbide mononitrate (IMDUR) 30 MG 24 hr tablet Take 1 tablet (30 mg total) by mouth every morning. 30 tablet 12  . levothyroxine (SYNTHROID) 137 MCG tablet Take 1 tablet by mouth once daily 90 tablet 0  . losartan (COZAAR) 100 MG tablet TAKE 1 TABLET BY MOUTH ONCE DAILY 90 tablet 3  . Melatonin 3 MG TBDP Take 1 tablet by mouth at bedtime.    . mirtazapine (REMERON) 30 MG tablet Take 1 tablet (30 mg total) by mouth at bedtime. 90 tablet 3  . Multiple Vitamin (MULTIVITAMIN) capsule Take 1 capsule by mouth daily.    Marland Kitchen  pantoprazole (PROTONIX) 40 MG tablet Take 1 tablet (40 mg total) by mouth daily. 30 tablet 5  . sertraline (ZOLOFT) 25 MG tablet Take 1 tablet by mouth once daily 90 tablet 3  . albuterol (PROVENTIL) (2.5 MG/3ML) 0.083% nebulizer solution Take 2.5 mg by nebulization every 4 (four) hours as needed.     Marland Kitchen amitriptyline (ELAVIL) 10 MG tablet Take 1 tablet (10 mg total) by mouth at bedtime. (Patient not taking: Reported on 03/12/2019) 90 tablet 3   No current facility-administered medications for this visit.      Allergies as of 03/20/2019 - Review Complete 03/20/2019  Allergen Reaction Noted  . Levofloxacin  03/11/2015  . Lisinopril  06/14/2017  . Povidone-iodine Other (See Comments) 09/22/2017  . Simvastatin  03/11/2015  . Codeine Nausea And Vomiting 04/08/2014    Review of Systems:    All systems reviewed and negative except where noted in HPI.   Observations/Objective:  Labs: CMP     Component Value Date/Time   NA 141 01/18/2019 1149   NA 133 (L) 04/26/2014 1141   K 4.0 01/18/2019 1149   K 3.9 04/26/2014 1141   CL 104 01/18/2019 1149   CL 99 04/26/2014 1141   CO2 23 01/18/2019 1149   CO2 27 04/26/2014 1141   GLUCOSE 122 (H) 01/18/2019 1149   GLUCOSE 89 04/26/2014 1141   BUN 16 01/18/2019 1149   BUN 19 (H) 04/26/2014 1141   CREATININE 1.21 (H) 01/18/2019 1149   CREATININE 1.20 04/26/2014 1141   CALCIUM 9.1 01/18/2019 1149   CALCIUM 8.8 04/26/2014 1141   PROT 5.9 (L) 01/09/2019 1145   PROT 7.4 04/26/2014 1141   ALBUMIN 4.2 01/18/2019 1149   ALBUMIN 3.8 04/26/2014 1141   AST 15 01/09/2019 1145   AST 22 04/26/2014 1141   ALT 13 01/09/2019 1145   ALT 29 04/26/2014 1141   ALKPHOS 109 01/09/2019 1145   ALKPHOS 92 04/26/2014 1141   BILITOT 0.2 01/09/2019 1145   BILITOT 0.5 04/26/2014 1141   GFRNONAA 41 (L) 01/18/2019 1149   GFRNONAA 42 (L) 04/26/2014 1141   GFRAA 47 (L) 01/18/2019 1149   GFRAA 49 (L) 04/26/2014 1141   Lab Results  Component Value Date   WBC 10.3 03/12/2019   HGB 10.9 (L) 03/12/2019   HCT 34.3 03/12/2019   MCV 86 03/12/2019   PLT 201 03/12/2019    Imaging Studies: No results found.  Assessment and Plan:   Stephanie Charles is a 85 y.o. y/o female here for follow-up of iron deficiency anemia  Assessment and Plan: Given patient's continued need for aspirin and Plavix, and continued iron deficiency, albeit improved hemoglobin with iron replacement, we discussed need for small bowel capsule again to evaluate for sources of her iron deficiency.    No  active GI bleeding at this time patient is agreeable to small bowel capsule study Most likely source of iron deficiency is possible underlying AVMs in her small bowel  Small bowel capsule equipment not available at [facility]  we will have to refer patient to tertiary center and our clinic will work on scheduling this.   Follow Up Instructions: Follow-up in 1 to 2 months   I discussed the assessment and treatment plan with the patient. The patient was provided an opportunity to ask questions and all were answered. The patient agreed with the plan and demonstrated an understanding of the instructions.   The patient was advised to call back or seek an in-person evaluation if the symptoms worsen  or if the condition fails to improve as anticipated.  I provided 30 minutes of non-face-to-face time during this encounter. Additional time was spent in reviewing patient's chart, placing orders etc.   Virgel Manifold, MD  Speech recognition software was used to dictate this note.

## 2019-03-27 ENCOUNTER — Telehealth: Payer: Self-pay

## 2019-03-27 NOTE — Telephone Encounter (Signed)
Records faxed to [facility] in [place] for Capsule study for anemia.

## 2019-03-28 ENCOUNTER — Telehealth: Payer: Self-pay | Admitting: Gastroenterology

## 2019-03-28 NOTE — Telephone Encounter (Signed)
Patient was calling with questions about the capsule study. State Dr Vicente Males mention it to her

## 2019-04-03 NOTE — Telephone Encounter (Signed)
I did explain to pt that referral was sent to [facility] location was preferred by pt if possible for her capsule study. Pt did inquire about cost of procedure and I did explain that [facility] would need to tell her about that and to make sure she ask for the billing dept number when they call, but they would most likely contact her from billing. If she has not heard anything within the next few weeks, she is to contact our office so that we can check on that for her. Pt appreciative of call.

## 2019-04-11 ENCOUNTER — Other Ambulatory Visit: Payer: Self-pay

## 2019-04-11 ENCOUNTER — Encounter: Payer: Self-pay | Admitting: Family Medicine

## 2019-04-11 ENCOUNTER — Ambulatory Visit: Payer: Self-pay | Admitting: Family Medicine

## 2019-04-11 ENCOUNTER — Ambulatory Visit (INDEPENDENT_AMBULATORY_CARE_PROVIDER_SITE_OTHER): Payer: Medicare Other | Admitting: Family Medicine

## 2019-04-11 VITALS — BP 154/63 | HR 67 | Temp 98.5°F | Resp 15 | Wt 169.4 lb

## 2019-04-11 DIAGNOSIS — G250 Essential tremor: Secondary | ICD-10-CM | POA: Diagnosis not present

## 2019-04-11 DIAGNOSIS — D649 Anemia, unspecified: Secondary | ICD-10-CM

## 2019-04-11 DIAGNOSIS — E039 Hypothyroidism, unspecified: Secondary | ICD-10-CM | POA: Diagnosis not present

## 2019-04-11 DIAGNOSIS — K5520 Angiodysplasia of colon without hemorrhage: Secondary | ICD-10-CM

## 2019-04-11 NOTE — Progress Notes (Signed)
Patient: Stephanie Charles Female    DOB: 04/17/1930   85 y.o.   MRN: [id number] Visit Date: 04/11/2019  Today's Provider: [name], MD   Chief Complaint  Patient presents with  . Anemia   Subjective:     HPI  Follow up for Anemia  The patient was last seen for this 1 months ago. Changes made at last visit include referring patient back to [facility]. Patient states today that she never was scheduled for appointment  She reports excellent compliance with treatment. She feels that condition is Unchanged. She is not having side effects. \  ------------------------------------------------------------------------------------  Allergies  Allergen Reactions  . Levofloxacin     Mouth sores  . Lisinopril     Cough?  . Povidone-Iodine Other (See Comments)    Severe blistering and itchiness. Severe redness  . Simvastatin     Myalgias  . Codeine Nausea And Vomiting     Current Outpatient Medications:  .  allopurinol (ZYLOPRIM) 300 MG tablet, TAKE 1 TABLET BY MOUTH ONCE DAILY, Disp: 90 tablet, Rfl: 3 .  amitriptyline (ELAVIL) 10 MG tablet, TAKE 2 TABLETS BY MOUTH AT BEDTIME, Disp: 60 tablet, Rfl: 12 .  amLODipine (NORVASC) 5 MG tablet, Take 1 tablet by mouth once daily, Disp: 90 tablet, Rfl: 3 .  aspirin 81 MG tablet, Take 81 mg by mouth daily. , Disp: , Rfl:  .  atorvastatin (LIPITOR) 40 MG tablet, TAKE 1 TABLET BY MOUTH ONCE DAILY, Disp: 90 tablet, Rfl: 3 .  carvedilol (COREG) 6.25 MG tablet, Take 1 tablet (6.25 mg total) by mouth 2 (two) times daily., Disp: 180 tablet, Rfl: 3 .  clopidogrel (PLAVIX) 75 MG tablet, Take 1 tablet by mouth once daily, Disp: 90 tablet, Rfl: 0 .  fluticasone (FLONASE) 50 MCG/ACT nasal spray, USE 2 SPRAY(S) IN EACH NOSTRIL ONCE DAILY, Disp: 16 g, Rfl: 11 .  hydrALAZINE (APRESOLINE) 25 MG tablet, TAKE 1 TABLET BY MOUTH THREE TIMES DAILY, Disp: 270 tablet, Rfl: 3 .  hydrochlorothiazide (HYDRODIURIL) 25 MG tablet, TAKE 1 TABLET BY MOUTH ONCE  DAILY, Disp: 90 tablet, Rfl: 3 .  isosorbide mononitrate (IMDUR) 30 MG 24 hr tablet, Take 1 tablet (30 mg total) by mouth every morning., Disp: 30 tablet, Rfl: 12 .  levothyroxine (SYNTHROID) 137 MCG tablet, Take 1 tablet by mouth once daily, Disp: 90 tablet, Rfl: 0 .  losartan (COZAAR) 100 MG tablet, TAKE 1 TABLET BY MOUTH ONCE DAILY, Disp: 90 tablet, Rfl: 3 .  Melatonin 3 MG TBDP, Take 1 tablet by mouth at bedtime., Disp: , Rfl:  .  mirtazapine (REMERON) 30 MG tablet, Take 1 tablet (30 mg total) by mouth at bedtime., Disp: 90 tablet, Rfl: 3 .  Multiple Vitamin (MULTIVITAMIN) capsule, Take 1 capsule by mouth daily., Disp: , Rfl:  .  pantoprazole (PROTONIX) 40 MG tablet, Take 1 tablet (40 mg total) by mouth daily., Disp: 30 tablet, Rfl: 5 .  sertraline (ZOLOFT) 25 MG tablet, Take 1 tablet by mouth once daily, Disp: 90 tablet, Rfl: 3 .  albuterol (PROVENTIL) (2.5 MG/3ML) 0.083% nebulizer solution, Take 2.5 mg by nebulization every 4 (four) hours as needed. , Disp: , Rfl:  .  amitriptyline (ELAVIL) 10 MG tablet, Take 1 tablet (10 mg total) by mouth at bedtime. (Patient not taking: Reported on 03/12/2019), Disp: 90 tablet, Rfl: 3  Review of Systems  Constitutional: Negative.   Respiratory: Negative.   Cardiovascular: Negative.   Gastrointestinal: Positive for diarrhea.  Endocrine: Negative.   Skin: Negative.   Allergic/Immunologic: Negative.   Hematological: Negative.   Psychiatric/Behavioral: Negative.     Social History   Tobacco Use  . Smoking status: Never Smoker  . Smokeless tobacco: Never Used  Substance Use Topics  . Alcohol use: No    Alcohol/week: 0.0 standard drinks      Objective:   BP (!) 154/63   Pulse 67   Temp 98.5 F (36.9 C) (Oral)   Resp 15   Wt 169 lb 6.4 oz (76.8 kg)   BMI 30.98 kg/m  Vitals:   04/11/19 1120  BP: (!) 154/63  Pulse: 67  Resp: 15  Temp: 98.5 F (36.9 C)  TempSrc: Oral  Weight: 169 lb 6.4 oz (76.8 kg)     Physical Exam Vitals signs  reviewed.  Constitutional:      Appearance: Normal appearance.  HENT:     Right Ear: External ear normal.     Left Ear: External ear normal.     Nose: Nose normal.  Eyes:     General: No scleral icterus.    Conjunctiva/sclera: Conjunctivae normal.  Cardiovascular:     Rate and Rhythm: Normal rate and regular rhythm.     Heart sounds: Normal heart sounds.  Pulmonary:     Effort: Pulmonary effort is normal.     Breath sounds: Normal breath sounds.  Abdominal:     Palpations: Abdomen is soft.     Tenderness: There is no abdominal tenderness.  Lymphadenopathy:     Cervical: No cervical adenopathy.  Skin:    General: Skin is warm and dry.  Neurological:     General: No focal deficit present.     Mental Status: She is alert and oriented to person, place, and time.  Psychiatric:        Mood and Affect: Mood normal.        Behavior: Behavior normal.        Thought Content: Thought content normal.        Judgment: Judgment normal.      No results found for any visits on 04/11/19.     Assessment & Plan    1. Anemia, unspecified type Taking iron BID.RTC 2 months. - CBC with Differential/Platelet - Iron, TIBC and Ferritin Panel  2. AVM (arteriovenous malformation) of colon   3. Adult hypothyroidism   4. Benign essential tremor      [name], MD  [facility] [employer] Mandes [place] as a scribe for [name], MD.,have documented all relevant documentation on the behalf of [name], MD,as directed by  [name], MD while in the presence of [name], MD.

## 2019-04-12 LAB — CBC WITH DIFFERENTIAL/PLATELET
Basophils Absolute: 0 10*3/uL (ref 0.0–0.2)
Basos: 1 %
EOS (ABSOLUTE): 0.2 10*3/uL (ref 0.0–0.4)
Eos: 2 %
Hematocrit: 38.1 % (ref 34.0–46.6)
Hemoglobin: 12.2 g/dL (ref 11.1–15.9)
Immature Grans (Abs): 0 10*3/uL (ref 0.0–0.1)
Immature Granulocytes: 0 %
Lymphocytes Absolute: 1.4 10*3/uL (ref 0.7–3.1)
Lymphs: 19 %
MCH: 28.8 pg (ref 26.6–33.0)
MCHC: 32 g/dL (ref 31.5–35.7)
MCV: 90 fL (ref 79–97)
Monocytes Absolute: 0.4 10*3/uL (ref 0.1–0.9)
Monocytes: 5 %
Neutrophils Absolute: 5.7 10*3/uL (ref 1.4–7.0)
Neutrophils: 73 %
Platelets: 169 10*3/uL (ref 150–450)
RBC: 4.23 x10E6/uL (ref 3.77–5.28)
RDW: 17.1 % — ABNORMAL HIGH (ref 11.7–15.4)
WBC: 7.7 10*3/uL (ref 3.4–10.8)

## 2019-04-12 LAB — IRON,TIBC AND FERRITIN PANEL
Ferritin: 52 ng/mL (ref 15–150)
Iron Saturation: 20 % (ref 15–55)
Iron: 60 ug/dL (ref 27–139)
Total Iron Binding Capacity: 298 ug/dL (ref 250–450)
UIBC: 238 ug/dL (ref 118–369)

## 2019-04-17 ENCOUNTER — Other Ambulatory Visit: Payer: Self-pay | Admitting: Family Medicine

## 2019-04-17 DIAGNOSIS — I10 Essential (primary) hypertension: Secondary | ICD-10-CM

## 2019-04-25 DIAGNOSIS — H2513 Age-related nuclear cataract, bilateral: Secondary | ICD-10-CM | POA: Diagnosis not present

## 2019-04-26 DIAGNOSIS — L918 Other hypertrophic disorders of the skin: Secondary | ICD-10-CM | POA: Diagnosis not present

## 2019-04-26 DIAGNOSIS — L821 Other seborrheic keratosis: Secondary | ICD-10-CM | POA: Diagnosis not present

## 2019-05-09 ENCOUNTER — Other Ambulatory Visit: Payer: Self-pay | Admitting: Family Medicine

## 2019-05-10 ENCOUNTER — Encounter: Payer: Self-pay | Admitting: Family Medicine

## 2019-05-10 ENCOUNTER — Other Ambulatory Visit: Payer: Self-pay

## 2019-05-10 ENCOUNTER — Ambulatory Visit (INDEPENDENT_AMBULATORY_CARE_PROVIDER_SITE_OTHER): Payer: Medicare Other | Admitting: Family Medicine

## 2019-05-10 VITALS — BP 147/62 | HR 70 | Temp 98.3°F | Wt 171.0 lb

## 2019-05-10 DIAGNOSIS — I10 Essential (primary) hypertension: Secondary | ICD-10-CM

## 2019-05-10 DIAGNOSIS — D500 Iron deficiency anemia secondary to blood loss (chronic): Secondary | ICD-10-CM | POA: Diagnosis not present

## 2019-05-10 DIAGNOSIS — K5520 Angiodysplasia of colon without hemorrhage: Secondary | ICD-10-CM | POA: Diagnosis not present

## 2019-05-10 MED ORDER — HYDRALAZINE HCL 50 MG PO TABS: 50.0000 mg | ORAL_TABLET | Freq: Three times a day (TID) | ORAL | 1 refills | Status: DC

## 2019-05-10 NOTE — Progress Notes (Signed)
Patient: Stephanie Charles Female    DOB: 11-26-1929   85 y.o.   MRN: [id number] Visit Date: 05/10/2019  Today's Provider: [name], MD   Chief Complaint  Patient presents with  . Hypertension  . Anemia   Subjective:     HPI    Hypertension, follow-up:  BP Readings from Last 3 Encounters:  05/10/19 (!) 147/62  04/11/19 (!) 154/63  03/12/19 (!) 149/69    She was last seen for hypertension 1 months ago.  BP at that visit was 154/63. Management since that visit includes no changes. She reports excellent compliance with treatment. She is not having side effects.  She is exercising. She is adherent to low salt diet.   Outside blood pressures are 170-190's/70-90's. She is experiencing lower extremity edema.  Patient denies chest pain, chest pressure/discomfort and syncope.   Cardiovascular risk factors include advanced age (older than 67 for men, 45 for women) and hypertension.  Use of agents associated with hypertension: none.     Weight trend: stable Wt Readings from Last 3 Encounters:  05/10/19 171 lb (77.6 kg)  04/11/19 169 lb 6.4 oz (76.8 kg)  03/12/19 167 lb (75.8 kg)    Current diet: in general, a "healthy" diet    ------------------------------------------------------------------------    Allergies  Allergen Reactions  . Levofloxacin     Mouth sores  . Lisinopril     Cough?  . Povidone-Iodine Other (See Comments)    Severe blistering and itchiness. Severe redness  . Simvastatin     Myalgias  . Codeine Nausea And Vomiting     Current Outpatient Medications:  .  allopurinol (ZYLOPRIM) 300 MG tablet, TAKE 1 TABLET BY MOUTH ONCE DAILY, Disp: 90 tablet, Rfl: 3 .  amitriptyline (ELAVIL) 10 MG tablet, Take 1 tablet (10 mg total) by mouth at bedtime., Disp: 90 tablet, Rfl: 3 .  amitriptyline (ELAVIL) 10 MG tablet, TAKE 2 TABLETS BY MOUTH AT BEDTIME, Disp: 60 tablet, Rfl: 12 .  amLODipine (NORVASC) 5 MG tablet, Take 1 tablet by mouth once  daily, Disp: 90 tablet, Rfl: 3 .  aspirin 81 MG tablet, Take 81 mg by mouth daily. , Disp: , Rfl:  .  atorvastatin (LIPITOR) 40 MG tablet, TAKE 1 TABLET BY MOUTH ONCE DAILY, Disp: 90 tablet, Rfl: 3 .  carvedilol (COREG) 6.25 MG tablet, Take 1 tablet (6.25 mg total) by mouth 2 (two) times daily., Disp: 180 tablet, Rfl: 3 .  clopidogrel (PLAVIX) 75 MG tablet, Take 1 tablet by mouth once daily, Disp: 90 tablet, Rfl: 3 .  fluticasone (FLONASE) 50 MCG/ACT nasal spray, USE 2 SPRAY(S) IN EACH NOSTRIL ONCE DAILY, Disp: 16 g, Rfl: 11 .  hydrALAZINE (APRESOLINE) 25 MG tablet, TAKE 1 TABLET BY MOUTH THREE TIMES DAILY, Disp: 270 tablet, Rfl: 3 .  hydrochlorothiazide (HYDRODIURIL) 25 MG tablet, Take 1 tablet by mouth once daily, Disp: 90 tablet, Rfl: 0 .  isosorbide mononitrate (IMDUR) 30 MG 24 hr tablet, Take 1 tablet (30 mg total) by mouth every morning., Disp: 30 tablet, Rfl: 12 .  levothyroxine (SYNTHROID) 137 MCG tablet, Take 1 tablet by mouth once daily, Disp: 90 tablet, Rfl: 0 .  losartan (COZAAR) 100 MG tablet, TAKE 1 TABLET BY MOUTH ONCE DAILY, Disp: 90 tablet, Rfl: 3 .  Melatonin 3 MG TBDP, Take 1 tablet by mouth at bedtime., Disp: , Rfl:  .  mirtazapine (REMERON) 30 MG tablet, Take 1 tablet (30 mg total) by mouth at bedtime.,  Disp: 90 tablet, Rfl: 3 .  Multiple Vitamin (MULTIVITAMIN) capsule, Take 1 capsule by mouth daily., Disp: , Rfl:  .  pantoprazole (PROTONIX) 40 MG tablet, Take 1 tablet (40 mg total) by mouth daily., Disp: 30 tablet, Rfl: 5 .  sertraline (ZOLOFT) 25 MG tablet, Take 1 tablet by mouth once daily, Disp: 90 tablet, Rfl: 3 .  albuterol (PROVENTIL) (2.5 MG/3ML) 0.083% nebulizer solution, Take 2.5 mg by nebulization every 4 (four) hours as needed. , Disp: , Rfl:   Review of Systems  Constitutional: Negative.   Eyes: Negative.   Respiratory: Positive for shortness of breath. Negative for apnea, cough, choking, chest tightness, wheezing and stridor.   Cardiovascular: Positive for leg  swelling (Right worse than left.). Negative for chest pain and palpitations.  Gastrointestinal: Positive for diarrhea (Secondary to taking iron). Negative for abdominal distention, abdominal pain, anal bleeding, blood in stool, constipation, nausea, rectal pain and vomiting.  Endocrine: Negative.   Musculoskeletal: Negative.   Allergic/Immunologic: Negative.   Neurological: Negative for dizziness, light-headedness and headaches.  Psychiatric/Behavioral: Negative.     Social History   Tobacco Use  . Smoking status: Never Smoker  . Smokeless tobacco: Never Used  Substance Use Topics  . Alcohol use: No    Alcohol/week: 0.0 standard drinks      Objective:   BP (!) 147/62 (BP Location: Right Arm, Patient Position: Sitting, Cuff Size: Large)   Pulse 70   Temp 98.3 F (36.8 C) (Oral)   Wt 171 lb (77.6 kg)   BMI 31.28 kg/m  Vitals:   05/10/19 1352  BP: (!) 147/62  Pulse: 70  Temp: 98.3 F (36.8 C)  TempSrc: Oral  Weight: 171 lb (77.6 kg)     Physical Exam Vitals signs reviewed.  Constitutional:      Appearance: Normal appearance.  HENT:     Right Ear: External ear normal.     Left Ear: External ear normal.     Nose: Nose normal.  Eyes:     General: No scleral icterus.    Conjunctiva/sclera: Conjunctivae normal.  Cardiovascular:     Rate and Rhythm: Normal rate and regular rhythm.     Heart sounds: Normal heart sounds.  Pulmonary:     Effort: Pulmonary effort is normal.     Breath sounds: Normal breath sounds.  Abdominal:     Palpations: Abdomen is soft.     Tenderness: There is no abdominal tenderness.  Lymphadenopathy:     Cervical: No cervical adenopathy.  Skin:    General: Skin is warm and dry.  Neurological:     General: No focal deficit present.     Mental Status: She is alert and oriented to person, place, and time.  Psychiatric:        Mood and Affect: Mood normal.        Behavior: Behavior normal.        Thought Content: Thought content normal.         Judgment: Judgment normal.      No results found for any visits on 05/10/19.     Assessment & Plan    1. Iron deficiency anemia due to chronic blood loss May stop iron if iron levels up. - CBC with Differential/Platelet - Iron and TIBC([facility])  2. AVM (arteriovenous malformation) of colon   3. Benign essential HTN      Annelyse Rey Cranford Mon, MD  [facility]

## 2019-05-11 LAB — IRON AND TIBC
Iron Saturation: 15 % (ref 15–55)
Iron: 47 ug/dL (ref 27–139)
Total Iron Binding Capacity: 315 ug/dL (ref 250–450)
UIBC: 268 ug/dL (ref 118–369)

## 2019-05-11 LAB — CBC WITH DIFFERENTIAL/PLATELET
Basophils Absolute: 0.1 10*3/uL (ref 0.0–0.2)
Basos: 1 %
EOS (ABSOLUTE): 0.2 10*3/uL (ref 0.0–0.4)
Eos: 2 %
Hematocrit: 31.4 % — ABNORMAL LOW (ref 34.0–46.6)
Hemoglobin: 10.5 g/dL — ABNORMAL LOW (ref 11.1–15.9)
Immature Grans (Abs): 0 10*3/uL (ref 0.0–0.1)
Immature Granulocytes: 0 %
Lymphocytes Absolute: 2 10*3/uL (ref 0.7–3.1)
Lymphs: 23 %
MCH: 30.3 pg (ref 26.6–33.0)
MCHC: 33.4 g/dL (ref 31.5–35.7)
MCV: 91 fL (ref 79–97)
Monocytes Absolute: 0.5 10*3/uL (ref 0.1–0.9)
Monocytes: 6 %
Neutrophils Absolute: 5.9 10*3/uL (ref 1.4–7.0)
Neutrophils: 68 %
Platelets: 193 10*3/uL (ref 150–450)
RBC: 3.46 x10E6/uL — ABNORMAL LOW (ref 3.77–5.28)
RDW: 15.8 % — ABNORMAL HIGH (ref 11.7–15.4)
WBC: 8.6 10*3/uL (ref 3.4–10.8)

## 2019-05-14 ENCOUNTER — Telehealth: Payer: Self-pay

## 2019-05-14 ENCOUNTER — Ambulatory Visit: Payer: Self-pay | Admitting: Family Medicine

## 2019-05-14 DIAGNOSIS — I10 Essential (primary) hypertension: Secondary | ICD-10-CM

## 2019-05-14 DIAGNOSIS — I209 Angina pectoris, unspecified: Secondary | ICD-10-CM

## 2019-05-14 NOTE — Telephone Encounter (Signed)
Left message to call back  

## 2019-05-14 NOTE — Telephone Encounter (Signed)
-----   Message from Jerrol Banana., MD sent at 05/11/2019  9:36 AM EDT ----- Labs good--stay on iron daily.

## 2019-05-15 NOTE — Telephone Encounter (Signed)
She is probably still slowly losing blood. Why GI is trying to get capsule endoscopy done. Stay on daily iron.

## 2019-05-15 NOTE — Telephone Encounter (Signed)
Pt returned call. Please advise. Thanks TNP °

## 2019-05-15 NOTE — Telephone Encounter (Signed)
Patient advised. She is questioning why the hemoglobin has decreased since last check. She would like a call back.   Patient is also requesting refills be sent to [place]. Patient also would like to know if she should be taking HCTZ and Hydralazine both. She states she is confused about the 2 medications.

## 2019-05-15 NOTE — Telephone Encounter (Signed)
Please advise 

## 2019-05-15 NOTE — Telephone Encounter (Signed)
Should patient still be taking HCTZ and hydralazine together? Please advise. Thanks!

## 2019-05-16 ENCOUNTER — Other Ambulatory Visit: Payer: Self-pay | Admitting: Family Medicine

## 2019-05-16 DIAGNOSIS — F3341 Major depressive disorder, recurrent, in partial remission: Secondary | ICD-10-CM

## 2019-05-16 MED ORDER — ISOSORBIDE MONONITRATE ER 30 MG PO TB24: 30.0000 mg | ORAL_TABLET | ORAL | 12 refills | Status: DC

## 2019-05-16 MED ORDER — ALBUTEROL SULFATE (2.5 MG/3ML) 0.083% IN NEBU: 2.5000 mg | INHALATION_SOLUTION | RESPIRATORY_TRACT | 3 refills | Status: DC | PRN

## 2019-05-16 MED ORDER — HYDRALAZINE HCL 50 MG PO TABS: 50.0000 mg | ORAL_TABLET | Freq: Three times a day (TID) | ORAL | 1 refills | Status: DC

## 2019-05-16 NOTE — Telephone Encounter (Signed)
Stop HCTZ

## 2019-05-16 NOTE — Telephone Encounter (Signed)
Patient advised as below. Patient verbalizes understanding and is in agreement with treatment plan.  

## 2019-06-01 ENCOUNTER — Other Ambulatory Visit: Payer: Self-pay | Admitting: Family Medicine

## 2019-06-01 DIAGNOSIS — G4700 Insomnia, unspecified: Secondary | ICD-10-CM

## 2019-06-01 NOTE — Telephone Encounter (Signed)
Pharmacy requesting refills.

## 2019-06-06 ENCOUNTER — Other Ambulatory Visit: Payer: Self-pay

## 2019-06-06 ENCOUNTER — Emergency Department: Payer: Medicare Other

## 2019-06-06 ENCOUNTER — Inpatient Hospital Stay
Admission: EM | Admit: 2019-06-06 | Discharge: 2019-06-08 | DRG: 246 | Disposition: A | Discharge status: Home / Self Care | Payer: Medicare Other | Attending: Internal Medicine | Admitting: Internal Medicine

## 2019-06-06 ENCOUNTER — Observation Stay (HOSPITAL_COMMUNITY)
Admit: 2019-06-06 | Discharge: 2019-06-06 | Disposition: A | Discharge status: Home / Self Care | Payer: Medicare Other | Attending: Internal Medicine | Admitting: Internal Medicine

## 2019-06-06 ENCOUNTER — Encounter: Payer: Self-pay | Admitting: Emergency Medicine

## 2019-06-06 DIAGNOSIS — I959 Hypotension, unspecified: Secondary | ICD-10-CM | POA: Diagnosis not present

## 2019-06-06 DIAGNOSIS — J302 Other seasonal allergic rhinitis: Secondary | ICD-10-CM | POA: Diagnosis not present

## 2019-06-06 DIAGNOSIS — I255 Ischemic cardiomyopathy: Secondary | ICD-10-CM | POA: Diagnosis present

## 2019-06-06 DIAGNOSIS — K219 Gastro-esophageal reflux disease without esophagitis: Secondary | ICD-10-CM | POA: Diagnosis present

## 2019-06-06 DIAGNOSIS — E782 Mixed hyperlipidemia: Secondary | ICD-10-CM

## 2019-06-06 DIAGNOSIS — I700 Atherosclerosis of aorta: Secondary | ICD-10-CM | POA: Diagnosis not present

## 2019-06-06 DIAGNOSIS — I25110 Atherosclerotic heart disease of native coronary artery with unstable angina pectoris: Secondary | ICD-10-CM | POA: Diagnosis not present

## 2019-06-06 DIAGNOSIS — I214 Non-ST elevation (NSTEMI) myocardial infarction: Secondary | ICD-10-CM

## 2019-06-06 DIAGNOSIS — Z7951 Long term (current) use of inhaled steroids: Secondary | ICD-10-CM

## 2019-06-06 DIAGNOSIS — Z8249 Family history of ischemic heart disease and other diseases of the circulatory system: Secondary | ICD-10-CM

## 2019-06-06 DIAGNOSIS — J810 Acute pulmonary edema: Secondary | ICD-10-CM

## 2019-06-06 DIAGNOSIS — I252 Old myocardial infarction: Secondary | ICD-10-CM | POA: Diagnosis not present

## 2019-06-06 DIAGNOSIS — Z96653 Presence of artificial knee joint, bilateral: Secondary | ICD-10-CM | POA: Diagnosis not present

## 2019-06-06 DIAGNOSIS — Z8673 Personal history of transient ischemic attack (TIA), and cerebral infarction without residual deficits: Secondary | ICD-10-CM

## 2019-06-06 DIAGNOSIS — E785 Hyperlipidemia, unspecified: Secondary | ICD-10-CM | POA: Diagnosis present

## 2019-06-06 DIAGNOSIS — E039 Hypothyroidism, unspecified: Secondary | ICD-10-CM | POA: Diagnosis present

## 2019-06-06 DIAGNOSIS — Z881 Allergy status to other antibiotic agents status: Secondary | ICD-10-CM

## 2019-06-06 DIAGNOSIS — R079 Chest pain, unspecified: Secondary | ICD-10-CM | POA: Diagnosis not present

## 2019-06-06 DIAGNOSIS — Z7982 Long term (current) use of aspirin: Secondary | ICD-10-CM

## 2019-06-06 DIAGNOSIS — I213 ST elevation (STEMI) myocardial infarction of unspecified site: Principal | ICD-10-CM | POA: Diagnosis present

## 2019-06-06 DIAGNOSIS — I5043 Acute on chronic combined systolic (congestive) and diastolic (congestive) heart failure: Secondary | ICD-10-CM | POA: Diagnosis present

## 2019-06-06 DIAGNOSIS — Z79899 Other long term (current) drug therapy: Secondary | ICD-10-CM

## 2019-06-06 DIAGNOSIS — I209 Angina pectoris, unspecified: Secondary | ICD-10-CM | POA: Diagnosis present

## 2019-06-06 DIAGNOSIS — Z7902 Long term (current) use of antithrombotics/antiplatelets: Secondary | ICD-10-CM | POA: Diagnosis not present

## 2019-06-06 DIAGNOSIS — I200 Unstable angina: Secondary | ICD-10-CM | POA: Diagnosis present

## 2019-06-06 DIAGNOSIS — I248 Other forms of acute ischemic heart disease: Secondary | ICD-10-CM | POA: Diagnosis not present

## 2019-06-06 DIAGNOSIS — N182 Chronic kidney disease, stage 2 (mild): Secondary | ICD-10-CM | POA: Diagnosis not present

## 2019-06-06 DIAGNOSIS — I447 Left bundle-branch block, unspecified: Secondary | ICD-10-CM

## 2019-06-06 DIAGNOSIS — I872 Venous insufficiency (chronic) (peripheral): Secondary | ICD-10-CM | POA: Diagnosis present

## 2019-06-06 DIAGNOSIS — Z79890 Hormone replacement therapy (postmenopausal): Secondary | ICD-10-CM

## 2019-06-06 DIAGNOSIS — Z885 Allergy status to narcotic agent status: Secondary | ICD-10-CM

## 2019-06-06 DIAGNOSIS — I160 Hypertensive urgency: Secondary | ICD-10-CM | POA: Diagnosis present

## 2019-06-06 DIAGNOSIS — R0902 Hypoxemia: Secondary | ICD-10-CM | POA: Diagnosis not present

## 2019-06-06 DIAGNOSIS — Z9861 Coronary angioplasty status: Secondary | ICD-10-CM | POA: Diagnosis not present

## 2019-06-06 DIAGNOSIS — I340 Nonrheumatic mitral (valve) insufficiency: Secondary | ICD-10-CM

## 2019-06-06 DIAGNOSIS — I130 Hypertensive heart and chronic kidney disease with heart failure and stage 1 through stage 4 chronic kidney disease, or unspecified chronic kidney disease: Secondary | ICD-10-CM | POA: Diagnosis present

## 2019-06-06 DIAGNOSIS — Z20828 Contact with and (suspected) exposure to other viral communicable diseases: Secondary | ICD-10-CM | POA: Diagnosis present

## 2019-06-06 DIAGNOSIS — I5021 Acute systolic (congestive) heart failure: Secondary | ICD-10-CM | POA: Diagnosis not present

## 2019-06-06 DIAGNOSIS — I5031 Acute diastolic (congestive) heart failure: Secondary | ICD-10-CM | POA: Diagnosis not present

## 2019-06-06 DIAGNOSIS — R0789 Other chest pain: Secondary | ICD-10-CM | POA: Diagnosis not present

## 2019-06-06 DIAGNOSIS — I10 Essential (primary) hypertension: Secondary | ICD-10-CM

## 2019-06-06 DIAGNOSIS — I2510 Atherosclerotic heart disease of native coronary artery without angina pectoris: Secondary | ICD-10-CM | POA: Diagnosis not present

## 2019-06-06 DIAGNOSIS — Z888 Allergy status to other drugs, medicaments and biological substances status: Secondary | ICD-10-CM

## 2019-06-06 DIAGNOSIS — R7989 Other specified abnormal findings of blood chemistry: Secondary | ICD-10-CM | POA: Diagnosis not present

## 2019-06-06 LAB — CBC WITH DIFFERENTIAL/PLATELET
Abs Immature Granulocytes: 0.02 10*3/uL (ref 0.00–0.07)
Basophils Absolute: 0 10*3/uL (ref 0.0–0.1)
Basophils Relative: 1 %
Eosinophils Absolute: 0.1 10*3/uL (ref 0.0–0.5)
Eosinophils Relative: 2 %
HCT: 36.5 % (ref 36.0–46.0)
Hemoglobin: 12.2 g/dL (ref 12.0–15.0)
Immature Granulocytes: 0 %
Lymphocytes Relative: 15 %
Lymphs Abs: 1.1 10*3/uL (ref 0.7–4.0)
MCH: 30.9 pg (ref 26.0–34.0)
MCHC: 33.4 g/dL (ref 30.0–36.0)
MCV: 92.4 fL (ref 80.0–100.0)
Monocytes Absolute: 0.4 10*3/uL (ref 0.1–1.0)
Monocytes Relative: 5 %
Neutro Abs: 5.7 10*3/uL (ref 1.7–7.7)
Neutrophils Relative %: 77 %
Platelets: 150 10*3/uL (ref 150–400)
RBC: 3.95 MIL/uL (ref 3.87–5.11)
RDW: 14.5 % (ref 11.5–15.5)
WBC: 7.4 10*3/uL (ref 4.0–10.5)
nRBC: 0 % (ref 0.0–0.2)

## 2019-06-06 LAB — BASIC METABOLIC PANEL
Anion gap: 10 (ref 5–15)
BUN: 20 mg/dL (ref 8–23)
CO2: 23 mmol/L (ref 22–32)
Calcium: 8.9 mg/dL (ref 8.9–10.3)
Chloride: 106 mmol/L (ref 98–111)
Creatinine, Ser: 1.03 mg/dL — ABNORMAL HIGH (ref 0.44–1.00)
GFR calc Af Amer: 57 mL/min — ABNORMAL LOW (ref >60)
GFR calc non Af Amer: 49 mL/min — ABNORMAL LOW (ref >60)
Glucose, Bld: 124 mg/dL — ABNORMAL HIGH (ref 70–99)
Potassium: 3.5 mmol/L (ref 3.5–5.1)
Sodium: 139 mmol/L (ref 135–145)

## 2019-06-06 LAB — TROPONIN I (HIGH SENSITIVITY)
Troponin I (High Sensitivity): 30 ng/L — ABNORMAL HIGH (ref <18)
Troponin I (High Sensitivity): 97 ng/L — ABNORMAL HIGH (ref <18)
Troponin I (High Sensitivity): 296 ng/L (ref <18)
Troponin I (High Sensitivity): 262 ng/L (ref <18)

## 2019-06-06 LAB — LIPID PANEL
Cholesterol: 100 mg/dL (ref 0–200)
HDL: 28 mg/dL — ABNORMAL LOW (ref >40)
LDL Cholesterol: 45 mg/dL (ref 0–99)
Total CHOL/HDL Ratio: 3.6 RATIO
Triglycerides: 133 mg/dL (ref <150)
VLDL: 27 mg/dL (ref 0–40)

## 2019-06-06 LAB — BRAIN NATRIURETIC PEPTIDE: B Natriuretic Peptide: 246 pg/mL — ABNORMAL HIGH (ref 0.0–100.0)

## 2019-06-06 LAB — MAGNESIUM: Magnesium: 1.8 mg/dL (ref 1.7–2.4)

## 2019-06-06 LAB — SARS CORONAVIRUS 2 BY RT PCR (HOSPITAL ORDER, PERFORMED IN ~~LOC~~ HOSPITAL LAB): SARS Coronavirus 2: NEGATIVE

## 2019-06-06 LAB — TSH: TSH: 0.432 u[IU]/mL (ref 0.350–4.500)

## 2019-06-06 MED ORDER — MIRTAZAPINE 15 MG PO TABS
30.0000 mg | ORAL_TABLET | Freq: Every day | ORAL | Status: DC
  Administered 2019-06-06 – 2019-06-07 (×2): 30 mg via ORAL
  Filled 2019-06-06 (×2): qty 2

## 2019-06-06 MED ORDER — ALLOPURINOL 300 MG PO TABS
300.0000 mg | ORAL_TABLET | Freq: Every day | ORAL | Status: DC
  Administered 2019-06-06 – 2019-06-08 (×2): 300 mg via ORAL
  Filled 2019-06-06 (×3): qty 1

## 2019-06-06 MED ORDER — FUROSEMIDE 10 MG/ML IJ SOLN
40.0000 mg | Freq: Once | INTRAMUSCULAR | Status: AC
  Administered 2019-06-06: 40 mg via INTRAVENOUS
  Filled 2019-06-06: qty 4

## 2019-06-06 MED ORDER — ALBUTEROL SULFATE (2.5 MG/3ML) 0.083% IN NEBU: 2.5000 mg | INHALATION_SOLUTION | RESPIRATORY_TRACT | Status: DC | PRN

## 2019-06-06 MED ORDER — ATORVASTATIN CALCIUM 20 MG PO TABS
40.0000 mg | ORAL_TABLET | Freq: Every day | ORAL | Status: DC
  Administered 2019-06-06 – 2019-06-07 (×2): 40 mg via ORAL
  Filled 2019-06-06 (×2): qty 2

## 2019-06-06 MED ORDER — LOSARTAN POTASSIUM 50 MG PO TABS
100.0000 mg | ORAL_TABLET | Freq: Every day | ORAL | Status: DC
  Administered 2019-06-06 – 2019-06-08 (×2): 100 mg via ORAL
  Filled 2019-06-06 (×2): qty 2

## 2019-06-06 MED ORDER — HYDRALAZINE HCL 50 MG PO TABS
50.0000 mg | ORAL_TABLET | Freq: Three times a day (TID) | ORAL | Status: DC
  Administered 2019-06-06 (×2): 50 mg via ORAL
  Filled 2019-06-06 (×2): qty 1

## 2019-06-06 MED ORDER — ACETAMINOPHEN 325 MG PO TABS: 650.0000 mg | ORAL_TABLET | ORAL | Status: DC | PRN

## 2019-06-06 MED ORDER — ADULT MULTIVITAMIN W/MINERALS CH
1.0000 | ORAL_TABLET | Freq: Every day | ORAL | Status: DC
  Administered 2019-06-08: 1 via ORAL
  Filled 2019-06-06: qty 1

## 2019-06-06 MED ORDER — CARVEDILOL 6.25 MG PO TABS
6.2500 mg | ORAL_TABLET | Freq: Two times a day (BID) | ORAL | Status: DC
  Administered 2019-06-06 – 2019-06-07 (×2): 6.25 mg via ORAL
  Filled 2019-06-06 (×2): qty 1

## 2019-06-06 MED ORDER — ISOSORBIDE MONONITRATE ER 30 MG PO TB24: 30.0000 mg | ORAL_TABLET | ORAL | Status: DC

## 2019-06-06 MED ORDER — HYDROCHLOROTHIAZIDE 25 MG PO TABS
25.0000 mg | ORAL_TABLET | Freq: Every day | ORAL | Status: DC
  Administered 2019-06-06 – 2019-06-08 (×2): 25 mg via ORAL
  Filled 2019-06-06 (×2): qty 1

## 2019-06-06 MED ORDER — LEVOTHYROXINE SODIUM 137 MCG PO TABS
137.0000 ug | ORAL_TABLET | Freq: Every day | ORAL | Status: DC
  Administered 2019-06-08: 137 ug via ORAL
  Filled 2019-06-06 (×2): qty 1

## 2019-06-06 MED ORDER — PANTOPRAZOLE SODIUM 40 MG PO TBEC
40.0000 mg | DELAYED_RELEASE_TABLET | Freq: Every day | ORAL | Status: DC
  Administered 2019-06-08: 40 mg via ORAL
  Filled 2019-06-06: qty 1

## 2019-06-06 MED ORDER — ASPIRIN EC 81 MG PO TBEC
81.0000 mg | DELAYED_RELEASE_TABLET | Freq: Every day | ORAL | Status: DC
  Administered 2019-06-07 – 2019-06-08 (×2): 81 mg via ORAL
  Filled 2019-06-06 (×2): qty 1

## 2019-06-06 MED ORDER — NITROGLYCERIN 0.4 MG SL SUBL: 0.4000 mg | SUBLINGUAL_TABLET | SUBLINGUAL | Status: DC | PRN

## 2019-06-06 MED ORDER — SERTRALINE HCL 25 MG PO TABS
25.0000 mg | ORAL_TABLET | Freq: Every day | ORAL | Status: DC
  Administered 2019-06-08: 25 mg via ORAL
  Filled 2019-06-06 (×2): qty 1

## 2019-06-06 MED ORDER — ENOXAPARIN SODIUM 40 MG/0.4ML ~~LOC~~ SOLN: 40.0000 mg | SUBCUTANEOUS | Status: DC

## 2019-06-06 MED ORDER — HEPARIN (PORCINE) 25000 UT/250ML-% IV SOLN
1250.0000 [IU]/h | INTRAVENOUS | Status: DC
  Administered 2019-06-06: 800 [IU]/h via INTRAVENOUS
  Filled 2019-06-06: qty 250

## 2019-06-06 MED ORDER — ONDANSETRON HCL 4 MG/2ML IJ SOLN
4.0000 mg | Freq: Four times a day (QID) | INTRAMUSCULAR | Status: DC | PRN
  Administered 2019-06-07: 4 mg via INTRAVENOUS
  Filled 2019-06-06: qty 2

## 2019-06-06 MED ORDER — FLUTICASONE PROPIONATE 50 MCG/ACT NA SUSP
2.0000 | Freq: Every day | NASAL | Status: DC
  Administered 2019-06-08: 2 via NASAL
  Filled 2019-06-06: qty 16

## 2019-06-06 MED ORDER — AMITRIPTYLINE HCL 10 MG PO TABS
20.0000 mg | ORAL_TABLET | Freq: Every day | ORAL | Status: DC
  Administered 2019-06-06 – 2019-06-07 (×2): 20 mg via ORAL
  Filled 2019-06-06 (×3): qty 2

## 2019-06-06 MED ORDER — CLOPIDOGREL BISULFATE 75 MG PO TABS
75.0000 mg | ORAL_TABLET | Freq: Every day | ORAL | Status: DC
  Administered 2019-06-06 – 2019-06-08 (×3): 75 mg via ORAL
  Filled 2019-06-06 (×3): qty 1

## 2019-06-06 NOTE — Progress Notes (Signed)
Pt arrived on the unit from the ED at 1710. Daughter at bedside. Pt was A&Ox4. VSS. Pt oriented to room and bedside equipment. Pt asked not to get OOB without assistance and verbalized understanding of this. Skin is intact. Fall risk and allergy bracelets applied. Orders reviewed, acknowledged, and initiated. Care plan and education initiated. Pt arrived with clothes, upper, and lower dentures. Bed is in lowest position, bed alarm is on, and the call bell is within reach.

## 2019-06-06 NOTE — ED Notes (Signed)
ED TO INPATIENT HANDOFF REPORT  ED Nurse Name and Phone #: Anderson Malta [phone]  S Name/Age/Gender Stephanie Charles 85 y.o. female Room/Bed: ED37A/ED37A  Code Status   Code Status: Full Code  Home/SNF/Other Home Patient oriented to: self, place, time and situation Is this baseline? Yes   Triage Complete: Triage complete  Chief Complaint cp  Triage Note Pt having chest pain at home and arrived via ems. Pt complains of chest pain 8/10 and SOB. EKG showed LBB. EMS gave nitro and chest pain went down to a 6/10.    Allergies Allergies  Allergen Reactions  . Levofloxacin     Mouth sores  . Lisinopril     Cough?  . Povidone-Iodine Other (See Comments)    Severe blistering and itchiness. Severe redness  . Simvastatin     Myalgias  . Codeine Nausea And Vomiting    Level of Care/Admitting Diagnosis ED Disposition    ED Disposition Condition [facility] Area: [place] [[id number]]  Level of Care: Telemetry [5]  Covid Evaluation: Confirmed COVID Negative  Diagnosis: Chest pain [id number]  Admitting Physician: Fritzi Mandes [id number]  Attending Physician: Fritzi Mandes [[id number]]  PT Class (Do Not Modify): Observation [104]  PT Acc Code (Do Not Modify): Observation [10022]       B Medical/Surgery History Past Medical History:  Diagnosis Date  . Anemia   . Diverticulitis   . GERD (gastroesophageal reflux disease)   . Hyperlipidemia   . Hypertension   . Hypothyroidism   . Myocardial infarction ([place]) 08/2017  . TIA (transient ischemic attack)    1 approx 2015, 1 approx 2018  . Vertigo    last episode several months ago  . [name]    Past Surgical History:  Procedure Laterality Date  . COLONOSCOPY WITH PROPOFOL N/A 06/22/2018   Procedure: COLONOSCOPY WITH PROPOFOL;  Surgeon: Jonathon Bellows, MD;  [facility] ENDOSCOPY;  Service: Endoscopy;  Laterality: N/A;  . ESOPHAGOGASTRODUODENOSCOPY (EGD) WITH PROPOFOL N/A 06/22/2018   Procedure: ESOPHAGOGASTRODUODENOSCOPY (EGD) WITH PROPOFOL;  Surgeon: Jonathon Bellows, MD;  Location: [facility] ENDOSCOPY;  Service: Endoscopy;  Laterality: N/A;  . KNEE ARTHROSCOPY Right   . REPLACEMENT TOTAL KNEE BILATERAL    . SHOULDER SURGERY Left      A IV Location/Drains/Wounds Patient Lines/Drains/Airways Status   Active Line/Drains/Airways    Name:   Placement date:   Placement time:   Site:   Days:   Peripheral IV 06/06/19 Right Antecubital   06/06/19    -    Antecubital   less than 1          Intake/Output Last 24 hours No intake or output data in the 24 hours ending 06/06/19 1530  Labs/Imaging Results for orders placed or performed during the hospital encounter of 06/06/19 (from the past 48 hour(s))  Basic metabolic panel     Status: Abnormal   Collection Time: 06/06/19  9:30 AM  Result Value Ref Range   Sodium 139 135 - 145 mmol/L   Potassium 3.5 3.5 - 5.1 mmol/L   Chloride 106 98 - 111 mmol/L   CO2 23 22 - 32 mmol/L   Glucose, Bld 124 (H) 70 - 99 mg/dL   BUN 20 8 - 23 mg/dL   Creatinine, Ser 1.03 (H) 0.44 - 1.00 mg/dL   Calcium 8.9 8.9 - 10.3 mg/dL   GFR calc non Af Amer 49 (L) >60 mL/min   GFR calc Af Amer 57 (L) >  60 mL/min   Anion gap 10 5 - 15    Comment: Performed at [facility], [address] [place], [place] [address]  CBC with Differential     Status: None   Collection Time: 06/06/19  9:30 AM  Result Value Ref Range   WBC 7.4 4.0 - 10.5 K/uL   RBC 3.95 3.87 - 5.11 MIL/uL   Hemoglobin 12.2 12.0 - 15.0 g/dL   HCT 36.5 36.0 - 46.0 %   MCV 92.4 80.0 - 100.0 fL   MCH 30.9 26.0 - 34.0 pg   MCHC 33.4 30.0 - 36.0 g/dL   RDW 14.5 11.5 - 15.5 %   Platelets 150 150 - 400 K/uL   nRBC 0.0 0.0 - 0.2 %   Neutrophils Relative % 77 %   Neutro Abs 5.7 1.7 - 7.7 K/uL   Lymphocytes Relative 15 %   Lymphs Abs 1.1 0.7 - 4.0 K/uL   Monocytes Relative 5 %   Monocytes Absolute 0.4 0.1 - 1.0 K/uL   Eosinophils Relative 2 %   Eosinophils Absolute 0.1 0.0 - 0.5  K/uL   Basophils Relative 1 %   Basophils Absolute 0.0 0.0 - 0.1 K/uL   Immature Granulocytes 0 %   Abs Immature Granulocytes 0.02 0.00 - 0.07 K/uL    Comment: Performed at [facility], [address] [place], [place] [address]  Brain natriuretic peptide     Status: Abnormal   Collection Time: 06/06/19  9:30 AM  Result Value Ref Range   B Natriuretic Peptide 246.0 (H) 0.0 - 100.0 pg/mL    Comment: Performed at [facility], [address] [place], [place] [address]  Troponin I (High Sensitivity)     Status: Abnormal   Collection Time: 06/06/19  9:30 AM  Result Value Ref Range   Troponin I (High Sensitivity) 30 (H) <18 ng/L    Comment: (NOTE) Elevated high sensitivity troponin I (hsTnI) values and significant  changes across serial measurements may suggest ACS but many other  chronic and acute conditions are known to elevate hsTnI results.  Refer to the "Links" section for chest pain algorithms and additional  guidance. Performed at [facility], [address] [place], [place] [address]   SARS Coronavirus 2 [facility] order, Performed in [facility] hospital lab) Nasopharyngeal Nasopharyngeal Swab     Status: None   Collection Time: 06/06/19  9:30 AM   Specimen: Nasopharyngeal Swab  Result Value Ref Range   SARS Coronavirus 2 NEGATIVE NEGATIVE    Comment: (NOTE) If result is NEGATIVE SARS-CoV-2 target nucleic acids are NOT DETECTED. The SARS-CoV-2 RNA is generally detectable in upper and lower  respiratory specimens during the acute phase of infection. The lowest  concentration of SARS-CoV-2 viral copies this assay can detect is 250  copies / mL. A negative result does not preclude SARS-CoV-2 infection  and should not be used as the sole basis for treatment or other  patient management decisions.  A negative result may occur with  improper specimen collection / handling, submission of specimen other  than nasopharyngeal swab, presence of viral  mutation(s) within the  areas targeted by this assay, and inadequate number of viral copies  (<250 copies / mL). A negative result must be combined with clinical  observations, patient history, and epidemiological information. If result is POSITIVE SARS-CoV-2 target nucleic acids are DETECTED. The SARS-CoV-2 RNA is generally detectable in upper and lower  respiratory specimens dur ing the acute phase of infection.  Positive  results are indicative of active infection with SARS-CoV-2.  Clinical  correlation with patient history and other diagnostic information is  necessary to determine patient infection status.  Positive results do  not rule out bacterial infection or co-infection with other viruses. If result is PRESUMPTIVE POSTIVE SARS-CoV-2 nucleic acids MAY BE PRESENT.   A presumptive positive result was obtained on the submitted specimen  and confirmed on repeat testing.  While 2019 novel coronavirus  (SARS-CoV-2) nucleic acids may be present in the submitted sample  additional confirmatory testing may be necessary for epidemiological  and / or clinical management purposes  to differentiate between  SARS-CoV-2 and other Sarbecovirus currently known to infect humans.  If clinically indicated additional testing with an alternate test  methodology [phone]) is advised. The SARS-CoV-2 RNA is generally  detectable in upper and lower respiratory sp ecimens during the acute  phase of infection. The expected result is Negative. Fact Sheet for Patients:  StrictlyIdeas.no Fact Sheet for Healthcare Providers: BankingDealers.co.za This test is not yet approved or cleared by the [place] FDA and has been authorized for detection and/or diagnosis of SARS-CoV-2 by FDA under an Emergency Use Authorization (EUA).  This EUA will remain in effect (meaning this test can be used) for the duration of the COVID-19 declaration under Section 564(b)(1)  of the Act, 21 U.S.C. section 360bbb-3(b)(1), unless the authorization is terminated or revoked sooner. Performed at [facility], [address] [place], [place] [address]   Troponin I (High Sensitivity)     Status: Abnormal   Collection Time: 06/06/19 11:26 AM  Result Value Ref Range   Troponin I (High Sensitivity) 97 (H) <18 ng/L    Comment: READ BACK AND VERIFIED WITH CHRISTINA BRAND 06/06/19 @ 56  [place] (NOTE) Elevated high sensitivity troponin I (hsTnI) values and significant  changes across serial measurements may suggest ACS but many other  chronic and acute conditions are known to elevate hsTnI results.  Refer to the "Links" section for chest pain algorithms and additional  guidance. Performed at [facility], [address], [place], [place] [address]    Dg Chest Portable 1 View  Result Date: 06/06/2019 CLINICAL DATA:  Chest pain. EXAM: PORTABLE CHEST 1 VIEW COMPARISON:  Radiographs of April 21, 2017. FINDINGS: Stable cardiomediastinal silhouette. Atherosclerosis of thoracic aorta is noted. Increased diffuse bilateral lung opacities are noted concerning for either edema or multifocal pneumonia. No pneumothorax or pleural effusion is noted. Bony thorax is unremarkable. Hypoinflation of the lungs is noted. IMPRESSION: Hypoinflation of the lungs. Increased diffuse bilateral lung opacities are noted concerning for either edema or multifocal pneumonia. Aortic Atherosclerosis (ICD10-I70.0). Electronically Signed   By: Marijo Conception M.D.   On: 06/06/2019 09:55    Pending Labs Unresulted Labs (From admission, onward)    Start     Ordered   06/13/19 0500  Creatinine, serum  (enoxaparin (LOVENOX)    CrCl >/= 30 ml/min)  Weekly,   STAT    Comments: while on enoxaparin therapy    06/06/19 1414   06/06/19 1515  TSH  Add-on,   AD     06/06/19 1514   06/06/19 1415  CBC  (enoxaparin (LOVENOX)    CrCl >/= 30 ml/min)  Once,   STAT    Comments: Baseline for enoxaparin  therapy IF NOT ALREADY DRAWN.  Notify MD if PLT < 100 K.    06/06/19 1414   06/06/19 1415  Creatinine, serum  (enoxaparin (LOVENOX)  CrCl >/= 30 ml/min)  Once,   STAT    Comments: Baseline for enoxaparin therapy IF NOT ALREADY DRAWN.    06/06/19 1414          Vitals/Pain Today's Vitals   06/06/19 1130 06/06/19 1200 06/06/19 1230 06/06/19 1300  BP: (!) 161/93 (!) 170/79 (!) 163/74 (!) 171/78  Pulse: 60 74 72 75  Resp: 19 (!) 23  18  Temp:      SpO2: 94% 94% 95% 96%  Weight:      Height:      PainSc:        Isolation Precautions No active isolations  Medications Medications  allopurinol (ZYLOPRIM) tablet 300 mg (has no administration in time range)  aspirin EC tablet 81 mg (has no administration in time range)  atorvastatin (LIPITOR) tablet 40 mg (has no administration in time range)  carvedilol (COREG) tablet 6.25 mg (has no administration in time range)  hydrALAZINE (APRESOLINE) tablet 50 mg (has no administration in time range)  hydrochlorothiazide (HYDRODIURIL) tablet 25 mg (has no administration in time range)  isosorbide mononitrate (IMDUR) 24 hr tablet 30 mg (has no administration in time range)  losartan (COZAAR) tablet 100 mg (has no administration in time range)  amitriptyline (ELAVIL) tablet 20 mg (has no administration in time range)  mirtazapine (REMERON) tablet 30 mg (has no administration in time range)  sertraline (ZOLOFT) tablet 25 mg (has no administration in time range)  levothyroxine (SYNTHROID) tablet 137 mcg (has no administration in time range)  pantoprazole (PROTONIX) EC tablet 40 mg (has no administration in time range)  clopidogrel (PLAVIX) tablet 75 mg (has no administration in time range)  multivitamin with minerals tablet 1 tablet (has no administration in time range)  albuterol (PROVENTIL) (2.5 MG/3ML) 0.083% nebulizer solution 2.5 mg (has no administration in time range)  fluticasone (FLONASE) 50 MCG/ACT nasal spray 2 spray (has no  administration in time range)  acetaminophen (TYLENOL) tablet 650 mg (has no administration in time range)  ondansetron (ZOFRAN) injection 4 mg (has no administration in time range)  enoxaparin (LOVENOX) injection 40 mg (has no administration in time range)  furosemide (LASIX) injection 40 mg (40 mg Intravenous Given 06/06/19 1124)    Mobility walks Low fall risk   Focused Assessments Cardiac Assessment Handoff:  Cardiac Rhythm: Normal sinus rhythm, Bundle branch block Lab Results  Component Value Date   TROPONINI <0.01 08/05/2016   No results found for: DDIMER Does the Patient currently have chest pain? No     R Recommendations: See Admitting Provider Note  Report given to:   Additional Notes:

## 2019-06-06 NOTE — ED Notes (Signed)
Date and time results received: 06/06/19 1247 (use smartphrase ".now" to insert current time)  Test: Troponin Critical Value: 24  Name of Provider Notified: Dr. Posey Pronto  Orders Received? Or Actions Taken?:

## 2019-06-06 NOTE — ED Triage Notes (Signed)
Pt having chest pain at home and arrived via ems. Pt complains of chest pain 8/10 and SOB. EKG showed LBB. EMS gave nitro and chest pain went down to a 6/10.

## 2019-06-06 NOTE — Progress Notes (Signed)
CRITICAL VALUE ALERT  Critical Value:  Troponin 296  Date & Time Notied:  06/06/2019 at 1800  Provider Notified: Dr. Fritzi Mandes and Dr. Rockey Situ  Orders Received/Actions taken: Heparin drip orders placed

## 2019-06-06 NOTE — H&P (Signed)
[place] at  NAME: Stephanie Charles    MR#:  [id number]  DATE OF BIRTH:  [birth year]  DATE OF ADMISSION:  06/06/2019  PRIMARY CARE PHYSICIAN: Jerrol Banana., MD   REQUESTING/REFERRING PHYSICIAN: Blake Divine, MD  CHIEF COMPLAINT:  chest pain and shortness of breath  HISTORY OF PRESENT ILLNESS:  Stephanie Charles  is a 85 y.o. female with a known history of hypertension, history of CAD (per patient), TIA, hypothyroidism comes to the emergency room after she woke up this morning with severe burning pain in the center of her chest. She took her blood pressure and was in the 200s. Patient started also noticing some shortness of breath. She also has noticed some leg edema during the midday in both her ankles. She takes hydrochlorothiazide on a daily basis.  During my evaluation she is chest pain-free. Her blood pressure is 163/70. She denies any symptoms at present.  Given multiple risk factors and chest pain along with malignant hypertension patient is being admitted there evaluation management.  BNP was 246. She received IV Lasix 40 mg times one  PAST MEDICAL HISTORY:   Past Medical History:  Diagnosis Date  . Anemia   . Diverticulitis   . GERD (gastroesophageal reflux disease)   . Hyperlipidemia   . Hypertension   . Hypothyroidism   . Myocardial infarction ([place]) 08/2017  . TIA (transient ischemic attack)    1 approx 2015, 1 approx 2018  . Vertigo    last episode several months ago  . [name]     PAST SURGICAL HISTOIRY:   Past Surgical History:  Procedure Laterality Date  . COLONOSCOPY WITH PROPOFOL N/A 06/22/2018   Procedure: COLONOSCOPY WITH PROPOFOL;  Surgeon: Jonathon Bellows, MD;  Location: [facility] ENDOSCOPY;  Service: Endoscopy;  Laterality: N/A;  . ESOPHAGOGASTRODUODENOSCOPY (EGD) WITH PROPOFOL N/A 06/22/2018   Procedure: ESOPHAGOGASTRODUODENOSCOPY (EGD) WITH PROPOFOL;  Surgeon: Jonathon Bellows, MD;   Location: [facility] ENDOSCOPY;  Service: Endoscopy;  Laterality: N/A;  . KNEE ARTHROSCOPY Right   . REPLACEMENT TOTAL KNEE BILATERAL    . SHOULDER SURGERY Left     SOCIAL HISTORY:   Social History   Tobacco Use  . Smoking status: Never Smoker  . Smokeless tobacco: Never Used  Substance Use Topics  . Alcohol use: No    Alcohol/week: 0.0 standard drinks    FAMILY HISTORY:   Family History  Problem Relation Age of Onset  . Heart disease Mother   . Hypertension Mother   . Stroke Mother   . Heart disease Father   . Breast cancer Sister   . Alzheimer's disease Brother   . Heart attack Brother   . Stroke Brother   . Heart disease Brother   . Clotting disorder Daughter   . Mental illness Brother        died suicide  . Cancer Brother        prostate  . Heart disease Brother        atrial fib  . Alzheimer's disease Brother   . Fibromyalgia Daughter     DRUG ALLERGIES:   Allergies  Allergen Reactions  . Levofloxacin     Mouth sores  . Lisinopril     Cough?  . Povidone-Iodine Other (See Comments)    Severe blistering and itchiness. Severe redness  . Simvastatin     Myalgias  . Codeine Nausea And Vomiting    REVIEW OF SYSTEMS:  Review of  Systems  Constitutional: Negative for chills, fever and weight loss.  HENT: Negative for ear discharge, ear pain and nosebleeds.   Eyes: Negative for blurred vision, pain and discharge.  Respiratory: Positive for shortness of breath. Negative for sputum production, wheezing and stridor.   Cardiovascular: Positive for chest pain and PND. Negative for palpitations and orthopnea.  Gastrointestinal: Negative for abdominal pain, diarrhea, nausea and vomiting.  Genitourinary: Negative for frequency and urgency.  Musculoskeletal: Negative for back pain and joint pain.  Neurological: Negative for sensory change, speech change, focal weakness and weakness.  Psychiatric/Behavioral: Negative for depression and hallucinations. The patient is not  nervous/anxious.      MEDICATIONS AT HOME:   Prior to Admission medications   Medication Sig Start Date End Date Taking? Authorizing Provider  albuterol (PROVENTIL) (2.5 MG/3ML) 0.083% nebulizer solution Take 3 mLs (2.5 mg total) by nebulization every 4 (four) hours as needed. 05/16/19 05/15/20 Yes Jerrol Banana., MD  allopurinol (ZYLOPRIM) 300 MG tablet TAKE 1 TABLET BY MOUTH ONCE DAILY Patient taking differently: Take 300 mg by mouth daily.  08/21/18  Yes Jerrol Banana., MD  amitriptyline (ELAVIL) 10 MG tablet TAKE 2 TABLETS BY MOUTH AT BEDTIME Patient taking differently: Take 20 mg by mouth at bedtime.  06/01/19  Yes Jerrol Banana., MD  aspirin 81 MG tablet Take 81 mg by mouth daily.  10/26/11  Yes [provider]  atorvastatin (LIPITOR) 40 MG tablet TAKE 1 TABLET BY MOUTH ONCE DAILY Patient taking differently: Take 40 mg by mouth daily.  08/21/18  Yes Jerrol Banana., MD  carvedilol (COREG) 6.25 MG tablet Take 1 tablet (6.25 mg total) by mouth 2 (two) times daily. 05/29/18  Yes Jerrol Banana., MD  clopidogrel (PLAVIX) 75 MG tablet Take 1 tablet by mouth once daily Patient taking differently: Take 75 mg by mouth daily.  05/09/19  Yes Jerrol Banana., MD  fluticasone [facility]) 50 MCG/ACT nasal spray USE 2 SPRAY(S) IN EACH NOSTRIL ONCE DAILY Patient taking differently: Place 2 sprays into both nostrils daily.  03/29/18  Yes Jerrol Banana., MD  hydrALAZINE (APRESOLINE) 50 MG tablet Take 1 tablet (50 mg total) by mouth 3 (three) times daily. 05/16/19  Yes Jerrol Banana., MD  hydrochlorothiazide (HYDRODIURIL) 25 MG tablet Take 1 tablet by mouth once daily Patient taking differently: Take 25 mg by mouth daily.  04/18/19  Yes Jerrol Banana., MD  isosorbide mononitrate (IMDUR) 30 MG 24 hr tablet Take 1 tablet (30 mg total) by mouth every morning. 05/16/19  Yes Jerrol Banana., MD  levothyroxine (SYNTHROID) 137 MCG tablet  Take 1 tablet by mouth once daily Patient taking differently: Take 137 mcg by mouth daily.  05/16/19  Yes Jerrol Banana., MD  losartan (COZAAR) 100 MG tablet TAKE 1 TABLET BY MOUTH ONCE DAILY Patient taking differently: Take 100 mg by mouth daily.  08/07/18  Yes Jerrol Banana., MD  mirtazapine (REMERON) 30 MG tablet TAKE 1 TABLET BY MOUTH AT BEDTIME Patient taking differently: Take 30 mg by mouth at bedtime.  05/16/19  Yes Jerrol Banana., MD  Multiple Vitamin (MULTIVITAMIN) capsule Take 1 capsule by mouth daily.   Yes [provider]  pantoprazole (PROTONIX) 40 MG tablet Take 1 tablet (40 mg total) by mouth daily. 01/18/19  Yes Jerrol Banana., MD  sertraline (ZOLOFT) 25 MG tablet Take 1 tablet by mouth once daily Patient  taking differently: Take 25 mg by mouth daily.  03/08/19  Yes Jerrol Banana., MD      VITAL SIGNS:  Blood pressure (!) 171/78, pulse 75, temperature 98.8 F (37.1 C), resp. rate 18, height 5\' 2"  (1.575 m), weight 72.6 kg, SpO2 96 %.  PHYSICAL EXAMINATION:  GENERAL:  85 y.o.-year-old patient lying in the bed with no acute distress.  EYES: Pupils equal, round, reactive to light and accommodation. No scleral icterus. Extraocular muscles intact.  HEENT: Head atraumatic, normocephalic. Oropharynx and nasopharynx clear.  NECK:  Supple, no jugular venous distention. No thyroid enlargement, no tenderness.  LUNGS: Normal breath sounds bilaterally, no wheezing, rales,rhonchi or crepitation. No use of accessory muscles of respiration.  CARDIOVASCULAR: S1, S2 normal. No murmurs, rubs, or gallops.  ABDOMEN: Soft, nontender, nondistended. Bowel sounds present. No organomegaly or mass.  EXTREMITIES: + pedal edema no cyanosis, or clubbing.  NEUROLOGIC: Cranial nerves II through XII are intact. Muscle strength 5/5 in all extremities. Sensation intact. Gait not checked.  PSYCHIATRIC: The patient is alert and oriented x 3.  SKIN: No obvious rash,  lesion, or ulcer.   LABORATORY PANEL:   CBC Recent Labs  Lab 06/06/19 0930  WBC 7.4  HGB 12.2  HCT 36.5  PLT 150   ------------------------------------------------------------------------------------------------------------------  Chemistries  Recent Labs  Lab 06/06/19 0930  NA 139  K 3.5  CL 106  CO2 23  GLUCOSE 124*  BUN 20  CREATININE 1.03*  CALCIUM 8.9   ------------------------------------------------------------------------------------------------------------------  Cardiac Enzymes No results for input(s): TROPONINI in the last 168 hours. ------------------------------------------------------------------------------------------------------------------  RADIOLOGY:  Dg Chest Portable 1 View  Result Date: 06/06/2019 CLINICAL DATA:  Chest pain. EXAM: PORTABLE CHEST 1 VIEW COMPARISON:  Radiographs of April 21, 2017. FINDINGS: Stable cardiomediastinal silhouette. Atherosclerosis of thoracic aorta is noted. Increased diffuse bilateral lung opacities are noted concerning for either edema or multifocal pneumonia. No pneumothorax or pleural effusion is noted. Bony thorax is unremarkable. Hypoinflation of the lungs is noted. IMPRESSION: Hypoinflation of the lungs. Increased diffuse bilateral lung opacities are noted concerning for either edema or multifocal pneumonia. Aortic Atherosclerosis (ICD10-I70.0). Electronically Signed   By: Marijo Conception M.D.   On: 06/06/2019 09:55    EKG:  normal sinus rhythm. Left bundle branch block. No acute ST elevation her depression  IMPRESSION AND PLAN:   Charlinda Blonder  is a 85 y.o. female with a known history of hypertension, history of CAD (per patient), TIA, hypothyroidism comes to the emergency room after she woke up this morning with severe burning pain in the center of her chest. She took her blood pressure and was in the 200s  1. Chest pain in the setting of malignant hypertension along with shortness of breath with mild elevated BNP  and troponin -admit for overnight observation -continue home medications -PRN hydralazine -troponin times two -cardiology consultation with Dr. Rockey Situ. Patient has seen Dr. Ubaldo Glassing in the past however wants to change cardiology -echo of the heart  2. Shortness of breath with elevated BNP and blood pressure likely has mild congestive heart failure diastolic -patient received Lasix in the ER -I will hold off on further Lasix at present-- give as needed -echo of the heart -continue other cardiac meds  3. Hypothyroidism continue Synthroid  4. Malignant hypertension -continue losartan, [place], carvedilol and HCTZ  All the records are reviewed and case discussed with ED provider.   CODE STATUS: full  TOTAL TIME TAKING CARE OF THIS PATIENT: 50 minutes.    Aerin Delany  Drayven Marchena M.D on 06/06/2019 at 3:30 PM  Between 7am to 6pm - Pager - [phone]  After 6pm go to [email or web address] - password [email or web address] [facility]  [facility]  Office  [phone]  CC: Primary care physician; Jerrol Banana., MD

## 2019-06-06 NOTE — Progress Notes (Signed)
ANTICOAGULATION CONSULT NOTE - Initial Consult  Pharmacy Consult for Heparin  Indication: chest pain/ACS  Allergies  Allergen Reactions  . Levofloxacin     Mouth sores  . Lisinopril     Cough?  . Povidone-Iodine Other (See Comments)    Severe blistering and itchiness. Severe redness  . Simvastatin     Myalgias  . Codeine Nausea And Vomiting    Patient Measurements: Height: 5\' 2"  (157.5 cm) Weight: 168 lb 3.2 oz (76.3 kg) IBW/kg (Calculated) : 50.1 Heparin Dosing Weight: 65.6 kg   Vital Signs: Temp: 98.5 F (36.9 C) (09/02 1718) Temp Source: Oral (09/02 1718) BP: 167/73 (09/02 1718) Pulse Rate: 81 (09/02 1718)  Labs: Recent Labs    06/06/19 0930 06/06/19 1126 06/06/19 1615  HGB 12.2  --   --   HCT 36.5  --   --   PLT 150  --   --   CREATININE 1.03*  --   --   TROPONINIHS 30* 97* 296*    Estimated Creatinine Clearance: 37.5 mL/min (A) (by C-G formula based on SCr of 1.03 mg/dL (H)).   Medical History: Past Medical History:  Diagnosis Date  . Anemia   . Diverticulitis   . GERD (gastroesophageal reflux disease)   . Hyperlipidemia   . Hypertension   . Hypothyroidism   . Myocardial infarction ([place]) 08/2017  . TIA (transient ischemic attack)    1 approx 2015, 1 approx 2018  . Vertigo    last episode several months ago  . [name]     Medications:  Medications Prior to Admission  Medication Sig Dispense Refill Last Dose  . albuterol (PROVENTIL) (2.5 MG/3ML) 0.083% nebulizer solution Take 3 mLs (2.5 mg total) by nebulization every 4 (four) hours as needed. 75 mL 3 Unknown at PRN  . allopurinol (ZYLOPRIM) 300 MG tablet TAKE 1 TABLET BY MOUTH ONCE DAILY (Patient taking differently: Take 300 mg by mouth daily. ) 90 tablet 3 06/05/2019 at 0900  . amitriptyline (ELAVIL) 10 MG tablet TAKE 2 TABLETS BY MOUTH AT BEDTIME (Patient taking differently: Take 20 mg by mouth at bedtime. ) 60 tablet 5 06/05/2019 at 2100  . aspirin 81 MG tablet Take 81 mg  by mouth daily.    06/05/2019 at Unknown time  . atorvastatin (LIPITOR) 40 MG tablet TAKE 1 TABLET BY MOUTH ONCE DAILY (Patient taking differently: Take 40 mg by mouth daily. ) 90 tablet 3 06/05/2019 at Unknown time  . carvedilol (COREG) 6.25 MG tablet Take 1 tablet (6.25 mg total) by mouth 2 (two) times daily. 180 tablet 3 06/06/2019 at 0830  . clopidogrel (PLAVIX) 75 MG tablet Take 1 tablet by mouth once daily (Patient taking differently: Take 75 mg by mouth daily. ) 90 tablet 3 06/05/2019 at Unknown time  . fluticasone (FLONASE) 50 MCG/ACT nasal spray USE 2 SPRAY(S) IN EACH NOSTRIL ONCE DAILY (Patient taking differently: Place 2 sprays into both nostrils daily. ) 16 g 11 06/05/2019 at Unknown time  . hydrALAZINE (APRESOLINE) 50 MG tablet Take 1 tablet (50 mg total) by mouth 3 (three) times daily. 270 tablet 1 06/06/2019 at 0830  . hydrochlorothiazide (HYDRODIURIL) 25 MG tablet Take 1 tablet by mouth once daily (Patient taking differently: Take 25 mg by mouth daily. ) 90 tablet 0 06/05/2019 at Unknown time  . isosorbide mononitrate (IMDUR) 30 MG 24 hr tablet Take 1 tablet (30 mg total) by mouth every morning. 30 tablet 12 06/05/2019 at Unknown time  .  levothyroxine (SYNTHROID) 137 MCG tablet Take 1 tablet by mouth once daily (Patient taking differently: Take 137 mcg by mouth daily. ) 90 tablet 3 06/05/2019 at Unknown time  . losartan (COZAAR) 100 MG tablet TAKE 1 TABLET BY MOUTH ONCE DAILY (Patient taking differently: Take 100 mg by mouth daily. ) 90 tablet 3 06/05/2019 at Unknown time  . mirtazapine (REMERON) 30 MG tablet TAKE 1 TABLET BY MOUTH AT BEDTIME (Patient taking differently: Take 30 mg by mouth at bedtime. ) 90 tablet 0 06/05/2019 at 2100  . Multiple Vitamin (MULTIVITAMIN) capsule Take 1 capsule by mouth daily.   06/05/2019 at Unknown time  . pantoprazole (PROTONIX) 40 MG tablet Take 1 tablet (40 mg total) by mouth daily. 30 tablet 5 06/05/2019 at Unknown time  . sertraline (ZOLOFT) 25 MG tablet Take 1 tablet by  mouth once daily (Patient taking differently: Take 25 mg by mouth daily. ) 90 tablet 3 06/05/2019 at Unknown time    Assessment: Pharmacy consulted to dose heparin in this 85 year old female with ACS/NSTEMI.  MD does not want bolus to be given. CrCl = 37.5 ml/min  Goal of Therapy:  Heparin level 0.3-0.7 units/ml Monitor platelets by anticoagulation protocol: Yes   Plan:  No bolus per MD.  Start heparin infusion at 800 units/hr Check anti-Xa level in 8 hours and daily while on heparin Continue to monitor H&H and platelets  Wylee Dorantes D 06/06/2019,6:43 PM

## 2019-06-06 NOTE — ED Provider Notes (Signed)
[facility] Emergency Department Provider Note   ____________________________________________   First MD Initiated Contact with Patient 06/06/19 [phone]     (approximate)  I have reviewed the triage vital signs and the nursing notes.   HISTORY  Chief Complaint Chest Pain and Shortness of Breath    HPI Stephanie Charles is a 85 y.o. female with past medical history of CAD, hypertension, TIA who presents to the ED complaining of chest pain and shortness of breath.  Patient reports that she woke up this morning with severe burning pain in the center of her chest.  It has been constant and was not exacerbated or alleviated by anything until EMS gave nitro, she now states that pain has almost entirely resolved.  It has been associated with shortness of breath and she also states this is been improved.  She denies any associated fevers or cough.  She does state that she has noticed some swelling in her feet at night, but this goes away when she wakes up in the morning.  She has not noticed any lower extremity calf pain, has not had any recent surgery or chemotherapy.  She reports taking 4 baby aspirin prior to arrival.        Past Medical History:  Diagnosis Date  . Anemia   . Diverticulitis   . GERD (gastroesophageal reflux disease)   . Hyperlipidemia   . Hypertension   . Hypothyroidism   . Myocardial infarction ([place]) 08/2017  . TIA (transient ischemic attack)    1 approx 2015, 1 approx 2018  . Vertigo    last episode several months ago  . [name]     Patient Active Problem List   Diagnosis Date Noted  . Chest pain 06/06/2019  . 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 Diverticulitis 04/13/2018  . 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 11/12/2017  . Ankle edema, bilateral 05/04/2017  . Abnormal blood sugar 03/11/2015  . Allergic rhinitis 03/11/2015  . Absolute anemia 03/11/2015  . Arthropathia  03/11/2015  . Benign essential HTN 03/11/2015  . Benign essential tremor 03/11/2015  . Artery disease, cerebral 03/11/2015  . Chronic venous insufficiency 03/11/2015  . Clinical depression 03/11/2015  . Urinary system disease 03/11/2015  . Acid reflux 03/11/2015  . HLD (hyperlipidemia) 03/11/2015  . Adult hypothyroidism 03/11/2015  . Irritable colon 03/11/2015  . Cannot sleep 03/11/2015  . Weak pulse 03/11/2015  . Carotid stenosis 03/11/2015  . Temporary cerebral vascular dysfunction 03/11/2015  . External carotid artery stenosis 07/12/2014    Past Surgical History:  Procedure Laterality Date  . COLONOSCOPY WITH PROPOFOL N/A 06/22/2018   Procedure: COLONOSCOPY WITH PROPOFOL;  Surgeon: Jonathon Bellows, MD;  Location: [facility] ENDOSCOPY;  Service: Endoscopy;  Laterality: N/A;  . ESOPHAGOGASTRODUODENOSCOPY (EGD) WITH PROPOFOL N/A 06/22/2018   Procedure: ESOPHAGOGASTRODUODENOSCOPY (EGD) WITH PROPOFOL;  Surgeon: Jonathon Bellows, MD;  Location: [facility] ENDOSCOPY;  Service: Endoscopy;  Laterality: N/A;  . KNEE ARTHROSCOPY Right   . REPLACEMENT TOTAL KNEE BILATERAL    . SHOULDER SURGERY Left     Prior to Admission medications   Medication Sig Start Date End Date Taking? Authorizing Provider  albuterol (PROVENTIL) (2.5 MG/3ML) 0.083% nebulizer solution Take 3 mLs (2.5 mg total) by nebulization every 4 (four) hours as needed. 05/16/19 05/15/20 Yes Jerrol Banana., MD  allopurinol (ZYLOPRIM) 300 MG tablet TAKE 1 TABLET BY MOUTH ONCE DAILY Patient taking differently: Take 300 mg by mouth daily.  08/21/18  Yes Jerrol Banana., MD  amitriptyline (ELAVIL) 10 MG tablet TAKE 2 TABLETS BY MOUTH AT BEDTIME Patient taking differently: Take 20 mg by mouth at bedtime.  06/01/19  Yes Jerrol Banana., MD  aspirin 81 MG tablet Take 81 mg by mouth daily.  10/26/11  Yes [provider]  atorvastatin (LIPITOR) 40 MG tablet TAKE 1 TABLET BY MOUTH ONCE DAILY Patient taking differently: Take 40 mg by  mouth daily.  08/21/18  Yes Jerrol Banana., MD  carvedilol (COREG) 6.25 MG tablet Take 1 tablet (6.25 mg total) by mouth 2 (two) times daily. 05/29/18  Yes Jerrol Banana., MD  clopidogrel (PLAVIX) 75 MG tablet Take 1 tablet by mouth once daily Patient taking differently: Take 75 mg by mouth daily.  05/09/19  Yes Jerrol Banana., MD  fluticasone [facility]) 50 MCG/ACT nasal spray USE 2 SPRAY(S) IN EACH NOSTRIL ONCE DAILY Patient taking differently: Place 2 sprays into both nostrils daily.  03/29/18  Yes Jerrol Banana., MD  hydrALAZINE (APRESOLINE) 50 MG tablet Take 1 tablet (50 mg total) by mouth 3 (three) times daily. 05/16/19  Yes Jerrol Banana., MD  hydrochlorothiazide (HYDRODIURIL) 25 MG tablet Take 1 tablet by mouth once daily Patient taking differently: Take 25 mg by mouth daily.  04/18/19  Yes Jerrol Banana., MD  isosorbide mononitrate (IMDUR) 30 MG 24 hr tablet Take 1 tablet (30 mg total) by mouth every morning. 05/16/19  Yes Jerrol Banana., MD  levothyroxine (SYNTHROID) 137 MCG tablet Take 1 tablet by mouth once daily Patient taking differently: Take 137 mcg by mouth daily.  05/16/19  Yes Jerrol Banana., MD  losartan (COZAAR) 100 MG tablet TAKE 1 TABLET BY MOUTH ONCE DAILY Patient taking differently: Take 100 mg by mouth daily.  08/07/18  Yes Jerrol Banana., MD  mirtazapine (REMERON) 30 MG tablet TAKE 1 TABLET BY MOUTH AT BEDTIME Patient taking differently: Take 30 mg by mouth at bedtime.  05/16/19  Yes Jerrol Banana., MD  Multiple Vitamin (MULTIVITAMIN) capsule Take 1 capsule by mouth daily.   Yes [provider]  pantoprazole (PROTONIX) 40 MG tablet Take 1 tablet (40 mg total) by mouth daily. 01/18/19  Yes Jerrol Banana., MD  sertraline (ZOLOFT) 25 MG tablet Take 1 tablet by mouth once daily Patient taking differently: Take 25 mg by mouth daily.  03/08/19  Yes Jerrol Banana., MD     Allergies Levofloxacin, Lisinopril, Povidone-iodine, Simvastatin, and Codeine  Family History  Problem Relation Age of Onset  . Heart disease Mother   . Hypertension Mother   . Stroke Mother   . Heart disease Father   . Breast cancer Sister   . Alzheimer's disease Brother   . Heart attack Brother   . Stroke Brother   . Heart disease Brother   . Clotting disorder Daughter   . Mental illness Brother        died suicide  . Cancer Brother        prostate  . Heart disease Brother        atrial fib  . Alzheimer's disease Brother   . Fibromyalgia Daughter     Social History Social History   Tobacco Use  . Smoking status: Never Smoker  . Smokeless tobacco: Never Used  Substance Use Topics  . Alcohol use: No    Alcohol/week: 0.0 standard drinks  . Drug use: No  Review of Systems  Constitutional: No fever/chills Eyes: No visual changes. ENT: No sore throat. Cardiovascular: Positive for chest pain. Respiratory: Positive for shortness of breath. Gastrointestinal: No abdominal pain.  No nausea, no vomiting.  No diarrhea.  No constipation. Genitourinary: Negative for dysuria. Musculoskeletal: Negative for back pain. Skin: Negative for rash. Neurological: Negative for headaches, focal weakness or numbness.  ____________________________________________   PHYSICAL EXAM:  VITAL SIGNS: ED Triage Vitals  Enc Vitals Group     BP      Pulse      Resp      Temp      Temp src      SpO2      Weight      Height      Head Circumference      Peak Flow      Pain Score      Pain Loc      Pain Edu?      Excl. in [place]?     Constitutional: Alert and oriented. Eyes: Conjunctivae are normal. Head: Atraumatic. Nose: No congestion/rhinnorhea. Mouth/Throat: Mucous membranes are moist. Neck: Normal ROM Cardiovascular: Normal rate, regular rhythm. Grossly normal heart sounds. Respiratory: Mild tachypnea, no respiratory distress.  No retractions. Lungs  CTAB. Gastrointestinal: Soft and nontender. No distention. Genitourinary: deferred Musculoskeletal: No lower extremity tenderness nor edema. Neurologic:  Normal speech and language. No gross focal neurologic deficits are appreciated. Skin:  Skin is warm, dry and intact. No rash noted. Psychiatric: Mood and affect are normal. Speech and behavior are normal.  ____________________________________________   LABS (all labs ordered are listed, but only abnormal results are displayed)  Labs Reviewed  BASIC METABOLIC PANEL - Abnormal; Notable for the following components:      Result Value   Glucose, Bld 124 (*)    Creatinine, Ser 1.03 (*)    GFR calc non Af Amer 49 (*)    GFR calc Af Amer 57 (*)    All other components within normal limits  BRAIN NATRIURETIC PEPTIDE - Abnormal; Notable for the following components:   B Natriuretic Peptide 246.0 (*)    All other components within normal limits  TROPONIN I (HIGH SENSITIVITY) - Abnormal; Notable for the following components:   Troponin I (High Sensitivity) 30 (*)    All other components within normal limits  TROPONIN I (HIGH SENSITIVITY) - Abnormal; Notable for the following components:   Troponin I (High Sensitivity) 97 (*)    All other components within normal limits  SARS CORONAVIRUS 2 (HOSPITAL ORDER, [place] [facility])  CBC WITH DIFFERENTIAL/PLATELET  CBC  CREATININE, SERUM  TSH  TROPONIN I (HIGH SENSITIVITY)   ____________________________________________  EKG  ED ECG REPORT I, Blake Divine, the attending physician, personally viewed and interpreted this ECG.   Date: 06/06/2019  EKG Time: 9:31  Rate: 86  Rhythm: normal sinus rhythm, LBBB  Axis: Normal  Intervals:none  ST&T Change: Negative Sgarbossa    PROCEDURES  Procedure(s) performed (including Critical Care):  Procedures   ____________________________________________   INITIAL IMPRESSION / ASSESSMENT AND PLAN / ED COURSE        85 year old female presents to the ED after waking up this morning with severe burning pain in the center of her chest which was associated with shortness of breath.  Initial EKG shows old left bundle branch block with negative scar Bosa criteria for acute ischemia.  Patient reports chest pain is almost entirely resolved after being given nitro by EMS.  Shortness of  breath is also improving, she is maintaining O2 sats on room air.  Patient is high risk for ACS given her history, will check troponin but anticipate admission for ACS rule out.  Will check chest x-ray, basic labs, BNP.  Troponin mildly elevated, however this is to the level that would be expected for patient's elevated creatinine.  She remains chest pain-free at this time, however given her high risk status, will admit for further ACS work-up.  Remainder of labs are unremarkable and chest x-ray negative for acute process.      ____________________________________________   FINAL CLINICAL IMPRESSION(S) / ED DIAGNOSES  Final diagnoses:  Atypical chest pain  Acute pulmonary edema (HCC)  Hyperlipidemia, unspecified hyperlipidemia type  Benign essential HTN     ED Discharge Orders    None       Note:  This document was prepared using Dragon voice recognition software and may include unintentional dictation errors.   Blake Divine, MD 06/06/19 [phone]

## 2019-06-06 NOTE — Progress Notes (Signed)
Family Meeting Note  Advance Directive:yes  Patient presented with chest pain shortness of breath and elevated blood pressure. She has borderline elevated troponin likely demand ischemia. Blood pressure much improved now and likely has underlying mild CHF. Patient is being admitted for chest pain rule out. Code status address she wishes to be full code. Time spent 16 minutes. No family in the ER. Time spent during discussion:Chelisa Hennen Posey Pronto, MD

## 2019-06-06 NOTE — Plan of Care (Signed)
  Problem: Pain Managment: Goal: General experience of comfort will improve Outcome: Progressing   

## 2019-06-06 NOTE — ED Notes (Signed)
Updated daughter Neoma Laming with patients permission.

## 2019-06-06 NOTE — Consult Note (Signed)
Cardiology Consultation:   Patient ID: Stephanie Charles MRN: [id number]; DOB: Jul 09, 1930  Admit date: 06/06/2019 Date of Consult: 06/06/2019  Primary Care Provider: Jerrol Banana., MD Primary Cardiologist: Ida Rogue, MD , Previously Dr. Billie Ruddy Cardiology Primary Electrophysiologist:  None    Patient Profile:   Stephanie Charles is a 85 y.o. female with a hx of HTN, h/o NSTEMI (2016, due to supply demand in setting of hypertensive urgency), HLD/ familial hypercholesterolemia, hypothyroid / thyroid disease, TIA/ posterior CVA 2016), bilateral mild carotid disease, bilateral dependent ankle edema, positive FOBT [id number], acute uncomplicated sigmoid diverticulitis, normocytic anemia, gout, anxiety/depression and GERD who is being seen today for the evaluation of elevated BP and atypical CP at the request of Dr. Posey Pronto and the patient (previously [facility]).  History of Present Illness:   Stephanie Charles is an 85 yo female with PMH as above. She reports "bad lungs" due to prolonged exposure to second hand smoke, as while she did not smoker, her husband smoked in the house. She denied any current alcohol or drug use. She reported she ambulates without assistance and lives in a condo on her own but with frequent help from her daughters. 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   She has PTA Plavix per recommendation of [facility] neurology, given her history of TIA/ CVA in 2016, which occurred during the same hospitalization as her elevated Tn due to supply demand ischemia. EKG during this hospitalization showed frequent PVCs, PACs, LVH with QRS widening and repolarization abnormality, Q waves in inferior leads. 2016 stress test showed fixed defect that improved with stress, normal EF, and was ruled low risk.  Per review of EMR, she is followed by [facility] for mild and asx bilateral carotid stenosis (<40%. 2017). Documentation indicates clinically insignificant plaque in the external carotids.  She has a history of 2 TIAs in 2016 with duplex at that time and repeat duplex documenting the suspicion that the TIAs are unrelated to the carotid disease.   Previously followed by [facility] Cardiology / Dr. Ubaldo Glassing. Per patient request, [facility] following this admission. 2017 Holter monitor, performed for PVCs, showed SR with occasional PVCs and PACs, occasional nonsustained SVT and WCT. No sustained supraventricular arrhythmias. She underwent stress test 12/07/2016 that was without evidence of ischemia and showed normal LV function. Echo 12/07/2016 showed normal LVSF and EF 55% with mild MR and mild TR.   Seen by [facility]iology 06/22/2018 for preop clearance prior to endoscopy and colonoscopy for recent 04/2018 hospitalization d/t uncomplicated diverticulitis with Hgb of 10.4. At that time, the patient was doing well from a cardiac standpoint. She reported DOE or SOB with extreme exertion but was able to carry out normal daily activities without assistance. She reported occasional palpitations that were well controlled on metoprolol. She also noted chronic LEE, more prevalent after long periods of standing. Wt 73.5kg, SBP 120, EKG showed SR, 1st degree AVB, known LBBB, no significant change from previous. On review of EMR, unable to find documentation of subsequent endoscopy or colonoscopy results following this cardiac clearance visit, with previous [facility] note indicating patient actually declined these procedures.   On 06/06/2019, she presented to [facility] ED with c/o racing HR, elevated BP, and CP. She reported that she felt her heart racing as she attempted to fall asleep on 9/1. She awoke the morning of 9/2 with CP and took her BP with noted BP 200s/100s. She reported that the non-radiating, non-pleuritic, and substernal CP was  10/10 severity and lasted for 1.5 hours or until receiving a SL nitroglycerin. Associated sx included feeling clammy. She denied any nausea, emesis, SOB, near syncope, or syncope. She continued to experience  rare palpitations and ongoing LEE "that starts at about 12PM and worsens throughout the day." Of note, she also reported dark and tarry stools / recent diarrhea but denied any BRBPR, hematuria, or hematochezia.  In the ED, BNP 246.0, HS Tn 30  97. Hg 12.2. K 3.5.Cr 1.03. EKG without acute changes and SR, 1st degree AVB, 86bpm, known LBBB, severe LVH.   Of note, she stated that her CP felt like before her previous "heart attacks and strokes;" however, review of EMR does document similar CP and racing HR reported at outpatient clinic follow-up appointments, as well as her 2016 NSTEMI in the setting of elevated BP thought likely 2/2 supply demand ischemia.   No CP on exam today.   Heart Pathway Score:     Past Medical History:  Diagnosis Date  . Anemia   . Diverticulitis   . GERD (gastroesophageal reflux disease)   . Hyperlipidemia   . Hypertension   . Hypothyroidism   . Myocardial infarction ([place]) 08/2017  . TIA (transient ischemic attack)    1 approx 2015, 1 approx 2018  . Vertigo    last episode several months ago  . [name]     Past Surgical History:  Procedure Laterality Date  . COLONOSCOPY WITH PROPOFOL N/A 06/22/2018   Procedure: COLONOSCOPY WITH PROPOFOL;  Surgeon: Jonathon Bellows, MD;  Location: [facility] ENDOSCOPY;  Service: Endoscopy;  Laterality: N/A;  . ESOPHAGOGASTRODUODENOSCOPY (EGD) WITH PROPOFOL N/A 06/22/2018   Procedure: ESOPHAGOGASTRODUODENOSCOPY (EGD) WITH PROPOFOL;  Surgeon: Jonathon Bellows, MD;  Location: [facility] ENDOSCOPY;  Service: Endoscopy;  Laterality: N/A;  . KNEE ARTHROSCOPY Right   . REPLACEMENT TOTAL KNEE BILATERAL    . SHOULDER SURGERY Left      Home Medications:  Prior to Admission medications   Medication Sig Start Date End Date Taking? Authorizing Provider  albuterol (PROVENTIL) (2.5 MG/3ML) 0.083% nebulizer solution Take 3 mLs (2.5 mg total) by nebulization every 4 (four) hours as needed. 05/16/19 05/15/20 Yes Jerrol Banana., MD   allopurinol (ZYLOPRIM) 300 MG tablet TAKE 1 TABLET BY MOUTH ONCE DAILY Patient taking differently: Take 300 mg by mouth daily.  08/21/18  Yes Jerrol Banana., MD  amitriptyline (ELAVIL) 10 MG tablet TAKE 2 TABLETS BY MOUTH AT BEDTIME Patient taking differently: Take 20 mg by mouth at bedtime.  06/01/19  Yes Jerrol Banana., MD  aspirin 81 MG tablet Take 81 mg by mouth daily.  10/26/11  Yes [provider]  atorvastatin (LIPITOR) 40 MG tablet TAKE 1 TABLET BY MOUTH ONCE DAILY Patient taking differently: Take 40 mg by mouth daily.  08/21/18  Yes Jerrol Banana., MD  carvedilol (COREG) 6.25 MG tablet Take 1 tablet (6.25 mg total) by mouth 2 (two) times daily. 05/29/18  Yes Jerrol Banana., MD  clopidogrel (PLAVIX) 75 MG tablet Take 1 tablet by mouth once daily Patient taking differently: Take 75 mg by mouth daily.  05/09/19  Yes Jerrol Banana., MD  fluticasone [facility]) 50 MCG/ACT nasal spray USE 2 SPRAY(S) IN EACH NOSTRIL ONCE DAILY Patient taking differently: Place 2 sprays into both nostrils daily.  03/29/18  Yes Jerrol Banana., MD  hydrALAZINE (APRESOLINE) 50 MG tablet Take 1 tablet (50 mg total) by mouth 3 (three)  times daily. 05/16/19  Yes Jerrol Banana., MD  hydrochlorothiazide (HYDRODIURIL) 25 MG tablet Take 1 tablet by mouth once daily Patient taking differently: Take 25 mg by mouth daily.  04/18/19  Yes Jerrol Banana., MD  isosorbide mononitrate (IMDUR) 30 MG 24 hr tablet Take 1 tablet (30 mg total) by mouth every morning. 05/16/19  Yes Jerrol Banana., MD  levothyroxine (SYNTHROID) 137 MCG tablet Take 1 tablet by mouth once daily Patient taking differently: Take 137 mcg by mouth daily.  05/16/19  Yes Jerrol Banana., MD  losartan (COZAAR) 100 MG tablet TAKE 1 TABLET BY MOUTH ONCE DAILY Patient taking differently: Take 100 mg by mouth daily.  08/07/18  Yes Jerrol Banana., MD  mirtazapine (REMERON) 30 MG  tablet TAKE 1 TABLET BY MOUTH AT BEDTIME Patient taking differently: Take 30 mg by mouth at bedtime.  05/16/19  Yes Jerrol Banana., MD  Multiple Vitamin (MULTIVITAMIN) capsule Take 1 capsule by mouth daily.   Yes [provider]  pantoprazole (PROTONIX) 40 MG tablet Take 1 tablet (40 mg total) by mouth daily. 01/18/19  Yes Jerrol Banana., MD  sertraline (ZOLOFT) 25 MG tablet Take 1 tablet by mouth once daily Patient taking differently: Take 25 mg by mouth daily.  03/08/19  Yes Jerrol Banana., MD    Inpatient Medications: Scheduled Meds: . allopurinol  300 mg Oral Daily  . amitriptyline  20 mg Oral QHS  . aspirin  81 mg Oral Daily  . atorvastatin  40 mg Oral Daily  . carvedilol  6.25 mg Oral BID  . clopidogrel  75 mg Oral Daily  . enoxaparin (LOVENOX) injection  40 mg Subcutaneous Q24H  . fluticasone  2 spray Each Nare Daily  . hydrALAZINE  50 mg Oral TID  . hydrochlorothiazide  25 mg Oral Daily  . [START ON 06/07/2019] isosorbide mononitrate  30 mg Oral BH-q7a  . levothyroxine  137 mcg Oral Daily  . losartan  100 mg Oral Daily  . mirtazapine  30 mg Oral QHS  . multivitamin  1 capsule Oral Daily  . pantoprazole  40 mg Oral Daily  . sertraline  25 mg Oral Daily   Continuous Infusions:  PRN Meds: acetaminophen, albuterol, ondansetron (ZOFRAN) IV  Allergies:    Allergies  Allergen Reactions  . Levofloxacin     Mouth sores  . Lisinopril     Cough?  . Povidone-Iodine Other (See Comments)    Severe blistering and itchiness. Severe redness  . Simvastatin     Myalgias  . Codeine Nausea And Vomiting    Social History:   Social History   Socioeconomic History  . Marital status: Divorced    Spouse name: na  . Number of children: 4  . Years of education: 24  . Highest education level: Associate degree: occupational, Hotel manager, or vocational program  Occupational History  . Occupation: retired  Scientific laboratory technician  . Financial resource strain: Not hard  at all  . Food insecurity    Worry: Never true    Inability: Never true  . Transportation needs    Medical: No    Non-medical: No  Tobacco Use  . Smoking status: Never Smoker  . Smokeless tobacco: Never Used  Substance and Sexual Activity  . Alcohol use: No    Alcohol/week: 0.0 standard drinks  . Drug use: No  . Sexual activity: Never  Lifestyle  . Physical activity    Days  per week: Not on file    Minutes per session: Not on file  . Stress: To some extent  Relationships  . Social Herbalist on phone: Not on file    Gets together: Not on file    Attends religious service: Not on file    Active member of club or organization: Not on file    Attends meetings of clubs or organizations: Not on file    Relationship status: Not on file  . Intimate partner violence    Fear of current or ex partner: Not on file    Emotionally abused: Not on file    Physically abused: Not on file    Forced sexual activity: Not on file  Other Topics Concern  . Not on file  Social History Narrative  . Not on file    Family History:    Family History  Problem Relation Age of Onset  . Heart disease Mother   . Hypertension Mother   . Stroke Mother   . Heart disease Father   . Breast cancer Sister   . Alzheimer's disease Brother   . Heart attack Brother   . Stroke Brother   . Heart disease Brother   . Clotting disorder Daughter   . Mental illness Brother        died suicide  . Cancer Brother        prostate  . Heart disease Brother        atrial fib  . Alzheimer's disease Brother   . Fibromyalgia Daughter      ROS:  Please see the history of present illness.  Review of Systems  Constitutional: Positive for malaise/fatigue.  Respiratory: Negative for hemoptysis and shortness of breath.   Cardiovascular: Positive for chest pain, palpitations and leg swelling. Negative for orthopnea.       No current CP. Chronic LEE, palpitations  Gastrointestinal: Positive for diarrhea  and melena. Negative for abdominal pain, blood in stool, nausea and vomiting.       Chronic melena  Genitourinary: Negative for hematuria.  Musculoskeletal: Negative for falls.  Neurological: Negative for loss of consciousness.    All other ROS reviewed and negative.     Physical Exam/Data:   Vitals:   06/06/19 1130 06/06/19 1200 06/06/19 1230 06/06/19 1300  BP: (!) 161/93 (!) 170/79 (!) 163/74 (!) 171/78  Pulse: 60 74 72 75  Resp: 19 (!) 23  18  Temp:      SpO2: 94% 94% 95% 96%  Weight:      Height:       No intake or output data in the 24 hours ending 06/06/19 1438 Last 3 Weights 06/06/2019 05/10/2019 04/11/2019  Weight (lbs) 160 lb 171 lb 169 lb 6.4 oz  Weight (kg) 72.576 kg 77.565 kg 76.839 kg     Body mass index is 29.26 kg/m.  General:  Well nourished, well developed, in no acute distress. Joined by daughter. HEENT: normal Neck: no JVD. + HJR Vascular:  radial pulses 2+ on right, 1+ on L Cardiac:  normal S1, S2; RRR; no murmur  Lungs:  Poor inspiratory effort. no wheezing, rhonchi or rales  Abd: soft, nontender, no hepatomegaly  Ext: mild b/l dependent LEE Musculoskeletal:  No deformities, BUE and BLE strength normal and equal Skin: warm and dry  Neuro:  No focal abnormalities noted Psych:  Normal affect   EKG:  The EKG was personally reviewed and demonstrates:  SR, 1st degree AVB, 86bpm, known LBBB,  severe LVH Telemetry:  Telemetry was personally reviewed and demonstrates:  SR  Relevant CV Studies: Pending echo  Previous [facility] Cardiology studies as above in HPI: 2017 Holter monitor, performed for PVCs, showed SR with occasional PVCs and PACs, occasional nonsustained SVT and WCT. No sustained supraventricular arrhythmias. She underwent stress test 12/07/2016 that was without evidence of ischemia and showed normal LV function. Echo 12/07/2016 showed normal LVSF and EF 55% with mild MR and mild TR.    Laboratory Data:  High Sensitivity Troponin:   Recent Labs  Lab  06/06/19 0930 06/06/19 1126  TROPONINIHS 30* 97*     Cardiac EnzymesNo results for input(s): TROPONINI in the last 168 hours. No results for input(s): TROPIPOC in the last 168 hours.  Chemistry Recent Labs  Lab 06/06/19 0930  NA 139  K 3.5  CL 106  CO2 23  GLUCOSE 124*  BUN 20  CREATININE 1.03*  CALCIUM 8.9  GFRNONAA 49*  GFRAA 57*  ANIONGAP 10    No results for input(s): PROT, ALBUMIN, AST, ALT, ALKPHOS, BILITOT in the last 168 hours. Hematology Recent Labs  Lab 06/06/19 0930  WBC 7.4  RBC 3.95  HGB 12.2  HCT 36.5  MCV 92.4  MCH 30.9  MCHC 33.4  RDW 14.5  PLT 150   BNP Recent Labs  Lab 06/06/19 0930  BNP 246.0*    DDimer No results for input(s): DDIMER in the last 168 hours.   Radiology/Studies:  Dg Chest Portable 1 View  Result Date: 06/06/2019 CLINICAL DATA:  Chest pain. EXAM: PORTABLE CHEST 1 VIEW COMPARISON:  Radiographs of April 21, 2017. FINDINGS: Stable cardiomediastinal silhouette. Atherosclerosis of thoracic aorta is noted. Increased diffuse bilateral lung opacities are noted concerning for either edema or multifocal pneumonia. No pneumothorax or pleural effusion is noted. Bony thorax is unremarkable. Hypoinflation of the lungs is noted. IMPRESSION: Hypoinflation of the lungs. Increased diffuse bilateral lung opacities are noted concerning for either edema or multifocal pneumonia. Aortic Atherosclerosis (ICD10-I70.0). Electronically Signed   By: Marijo Conception M.D.   On: 06/06/2019 09:55    Assessment and Plan:   Atypical CP, elevated HS Tn Supply demand ischemia suspected --No current CP. Previous CP in setting of BP 200s/100s. Does have history of elevated BP and supply demand ischemia with notes in [place] from 2016. --Tn elevation minimal and trending consistent with supply demand ischemia --EKG without acute changes and showing known LBBB, severe LVH --Likely supply demand ischemia in setting of elevated BP.  --Given risk factors for  cardiac etiology, will get echo and stress test. No current plan for cardiac catheterization unless significant drop in EF or significant increase in HS Tn. --NPO after midnight for stress test --Echo already ordered by IM. Previous EF normal. --Continue medical management with ASA (with PPI), Coreg, Plavix, losartan. SL nitro as needed for CP. Further recommendations following echo, stress.  HTN --Poorly controlled BP.  --Continue Imdur, ARB, HCTZ, hydralazine, Coreg. --Titrate antihypertensives for more optimal BP control as HR allows.  HLD --Continue statin --Recheck LDL, LFTs  History of palpitations, racing HR --No current complaint of palpitations. Previous Zio as above. Monitor on telemetry. Continue BB. Monitor electrolytes. Checking TSH.   Anemia --Daily CBC --Unclear if history of GIB as in HPI with previous labs showing Hgb 10   For questions or updates, please contact [place] Please consult [email or web address] for contact info under     Signed, Arvil Chaco, PA-C  06/06/2019 2:38 PM

## 2019-06-07 ENCOUNTER — Encounter
Admission: EM | Disposition: A | Discharge status: Home / Self Care | Payer: Self-pay | Source: Home / Self Care | Attending: Internal Medicine

## 2019-06-07 ENCOUNTER — Other Ambulatory Visit: Payer: Medicare Other

## 2019-06-07 DIAGNOSIS — E039 Hypothyroidism, unspecified: Secondary | ICD-10-CM | POA: Diagnosis present

## 2019-06-07 DIAGNOSIS — I200 Unstable angina: Secondary | ICD-10-CM | POA: Diagnosis present

## 2019-06-07 DIAGNOSIS — Z7902 Long term (current) use of antithrombotics/antiplatelets: Secondary | ICD-10-CM | POA: Diagnosis not present

## 2019-06-07 DIAGNOSIS — I10 Essential (primary) hypertension: Secondary | ICD-10-CM | POA: Diagnosis not present

## 2019-06-07 DIAGNOSIS — I25110 Atherosclerotic heart disease of native coronary artery with unstable angina pectoris: Secondary | ICD-10-CM | POA: Diagnosis present

## 2019-06-07 DIAGNOSIS — Z96653 Presence of artificial knee joint, bilateral: Secondary | ICD-10-CM | POA: Diagnosis present

## 2019-06-07 DIAGNOSIS — I160 Hypertensive urgency: Secondary | ICD-10-CM | POA: Diagnosis present

## 2019-06-07 DIAGNOSIS — I447 Left bundle-branch block, unspecified: Secondary | ICD-10-CM | POA: Diagnosis present

## 2019-06-07 DIAGNOSIS — E785 Hyperlipidemia, unspecified: Secondary | ICD-10-CM | POA: Diagnosis present

## 2019-06-07 DIAGNOSIS — I5043 Acute on chronic combined systolic (congestive) and diastolic (congestive) heart failure: Secondary | ICD-10-CM | POA: Diagnosis present

## 2019-06-07 DIAGNOSIS — I2510 Atherosclerotic heart disease of native coronary artery without angina pectoris: Secondary | ICD-10-CM

## 2019-06-07 DIAGNOSIS — I959 Hypotension, unspecified: Secondary | ICD-10-CM | POA: Diagnosis present

## 2019-06-07 DIAGNOSIS — N182 Chronic kidney disease, stage 2 (mild): Secondary | ICD-10-CM | POA: Diagnosis present

## 2019-06-07 DIAGNOSIS — I255 Ischemic cardiomyopathy: Secondary | ICD-10-CM | POA: Diagnosis present

## 2019-06-07 DIAGNOSIS — K219 Gastro-esophageal reflux disease without esophagitis: Secondary | ICD-10-CM | POA: Diagnosis present

## 2019-06-07 DIAGNOSIS — Z79890 Hormone replacement therapy (postmenopausal): Secondary | ICD-10-CM | POA: Diagnosis not present

## 2019-06-07 DIAGNOSIS — I213 ST elevation (STEMI) myocardial infarction of unspecified site: Secondary | ICD-10-CM | POA: Diagnosis present

## 2019-06-07 DIAGNOSIS — I252 Old myocardial infarction: Secondary | ICD-10-CM | POA: Diagnosis not present

## 2019-06-07 DIAGNOSIS — I214 Non-ST elevation (NSTEMI) myocardial infarction: Secondary | ICD-10-CM

## 2019-06-07 DIAGNOSIS — I872 Venous insufficiency (chronic) (peripheral): Secondary | ICD-10-CM | POA: Diagnosis present

## 2019-06-07 DIAGNOSIS — I700 Atherosclerosis of aorta: Secondary | ICD-10-CM | POA: Diagnosis present

## 2019-06-07 DIAGNOSIS — J810 Acute pulmonary edema: Secondary | ICD-10-CM | POA: Diagnosis present

## 2019-06-07 DIAGNOSIS — I130 Hypertensive heart and chronic kidney disease with heart failure and stage 1 through stage 4 chronic kidney disease, or unspecified chronic kidney disease: Secondary | ICD-10-CM | POA: Diagnosis present

## 2019-06-07 DIAGNOSIS — Z7951 Long term (current) use of inhaled steroids: Secondary | ICD-10-CM | POA: Diagnosis not present

## 2019-06-07 DIAGNOSIS — Z8673 Personal history of transient ischemic attack (TIA), and cerebral infarction without residual deficits: Secondary | ICD-10-CM | POA: Diagnosis not present

## 2019-06-07 DIAGNOSIS — Z7982 Long term (current) use of aspirin: Secondary | ICD-10-CM | POA: Diagnosis not present

## 2019-06-07 DIAGNOSIS — J302 Other seasonal allergic rhinitis: Secondary | ICD-10-CM | POA: Diagnosis present

## 2019-06-07 DIAGNOSIS — Z20828 Contact with and (suspected) exposure to other viral communicable diseases: Secondary | ICD-10-CM | POA: Diagnosis present

## 2019-06-07 DIAGNOSIS — I248 Other forms of acute ischemic heart disease: Secondary | ICD-10-CM | POA: Diagnosis present

## 2019-06-07 HISTORY — PX: RIGHT/LEFT HEART CATH AND CORONARY ANGIOGRAPHY: CATH118266

## 2019-06-07 HISTORY — PX: CORONARY STENT INTERVENTION: CATH118234

## 2019-06-07 LAB — CBC
HCT: 36.3 % (ref 36.0–46.0)
Hemoglobin: 12 g/dL (ref 12.0–15.0)
MCH: 31.1 pg (ref 26.0–34.0)
MCHC: 33.1 g/dL (ref 30.0–36.0)
MCV: 94 fL (ref 80.0–100.0)
Platelets: 154 10*3/uL (ref 150–400)
RBC: 3.86 MIL/uL — ABNORMAL LOW (ref 3.87–5.11)
RDW: 14.7 % (ref 11.5–15.5)
WBC: 10.6 10*3/uL — ABNORMAL HIGH (ref 4.0–10.5)
nRBC: 0 % (ref 0.0–0.2)

## 2019-06-07 LAB — ECHOCARDIOGRAM COMPLETE
Height: 62 in
Weight: 2691.2 oz

## 2019-06-07 LAB — GLUCOSE, CAPILLARY: Glucose-Capillary: 111 mg/dL — ABNORMAL HIGH (ref 70–99)

## 2019-06-07 LAB — BASIC METABOLIC PANEL
Anion gap: 9 (ref 5–15)
BUN: 25 mg/dL — ABNORMAL HIGH (ref 8–23)
CO2: 27 mmol/L (ref 22–32)
Calcium: 8.7 mg/dL — ABNORMAL LOW (ref 8.9–10.3)
Chloride: 104 mmol/L (ref 98–111)
Creatinine, Ser: 1.27 mg/dL — ABNORMAL HIGH (ref 0.44–1.00)
GFR calc Af Amer: 44 mL/min — ABNORMAL LOW (ref >60)
GFR calc non Af Amer: 38 mL/min — ABNORMAL LOW (ref >60)
Glucose, Bld: 129 mg/dL — ABNORMAL HIGH (ref 70–99)
Potassium: 3.2 mmol/L — ABNORMAL LOW (ref 3.5–5.1)
Sodium: 140 mmol/L (ref 135–145)

## 2019-06-07 LAB — HEPARIN LEVEL (UNFRACTIONATED)
Heparin Unfractionated: <0.1 IU/mL — ABNORMAL LOW (ref 0.30–0.70)
Heparin Unfractionated: 0.23 IU/mL — ABNORMAL LOW (ref 0.30–0.70)

## 2019-06-07 LAB — TROPONIN I (HIGH SENSITIVITY): Troponin I (High Sensitivity): 215 ng/L (ref <18)

## 2019-06-07 SURGERY — RIGHT/LEFT HEART CATH AND CORONARY ANGIOGRAPHY
Anesthesia: Moderate Sedation

## 2019-06-07 MED ORDER — SODIUM CHLORIDE 0.9% FLUSH
3.0000 mL | Freq: Two times a day (BID) | INTRAVENOUS | Status: DC
  Administered 2019-06-07 – 2019-06-08 (×2): 3 mL via INTRAVENOUS

## 2019-06-07 MED ORDER — SODIUM CHLORIDE 0.9% FLUSH: 3.0000 mL | INTRAVENOUS | Status: DC | PRN

## 2019-06-07 MED ORDER — SODIUM CHLORIDE 0.9 % IV SOLN: INTRAVENOUS | Status: DC

## 2019-06-07 MED ORDER — NITROGLYCERIN 1 MG/10 ML FOR IR/CATH LAB
INTRA_ARTERIAL | Status: DC | PRN
  Administered 2019-06-07: 200 ug via INTRACORONARY

## 2019-06-07 MED ORDER — CLOPIDOGREL BISULFATE 75 MG PO TABS
ORAL_TABLET | ORAL | Status: AC
  Filled 2019-06-07: qty 4

## 2019-06-07 MED ORDER — SODIUM CHLORIDE 0.9 % IV BOLUS
500.0000 mL | Freq: Once | INTRAVENOUS | Status: AC
  Administered 2019-06-07: 500 mL via INTRAVENOUS

## 2019-06-07 MED ORDER — ATROPINE SULFATE 1 MG/10ML IJ SOSY
0.5000 mg | PREFILLED_SYRINGE | Freq: Once | INTRAMUSCULAR | Status: AC
  Administered 2019-06-07: 01:00:00 0.5 mg via INTRAVENOUS

## 2019-06-07 MED ORDER — SODIUM CHLORIDE 0.9 % IV SOLN: 250.0000 mL | INTRAVENOUS | Status: DC | PRN

## 2019-06-07 MED ORDER — IOHEXOL 300 MG/ML  SOLN
INTRAMUSCULAR | Status: DC | PRN
  Administered 2019-06-07: 45 mL via INTRA_ARTERIAL

## 2019-06-07 MED ORDER — FENTANYL CITRATE (PF) 100 MCG/2ML IJ SOLN
INTRAMUSCULAR | Status: AC
  Filled 2019-06-07: qty 2

## 2019-06-07 MED ORDER — MIDAZOLAM HCL 2 MG/2ML IJ SOLN
INTRAMUSCULAR | Status: DC | PRN
  Administered 2019-06-07: 0.5 mg via INTRAVENOUS

## 2019-06-07 MED ORDER — BIVALIRUDIN TRIFLUOROACETATE 250 MG IV SOLR
INTRAVENOUS | Status: AC
  Filled 2019-06-07: qty 250

## 2019-06-07 MED ORDER — CARVEDILOL 12.5 MG PO TABS: 12.5000 mg | ORAL_TABLET | Freq: Two times a day (BID) | ORAL | Status: DC

## 2019-06-07 MED ORDER — CARVEDILOL 6.25 MG PO TABS
6.2500 mg | ORAL_TABLET | Freq: Two times a day (BID) | ORAL | Status: DC
  Administered 2019-06-07 – 2019-06-08 (×2): 6.25 mg via ORAL
  Filled 2019-06-07 (×2): qty 1

## 2019-06-07 MED ORDER — CLOPIDOGREL BISULFATE 75 MG PO TABS
ORAL_TABLET | ORAL | Status: DC | PRN
  Administered 2019-06-07: 300 mg via ORAL

## 2019-06-07 MED ORDER — NITROGLYCERIN 1 MG/10 ML FOR IR/CATH LAB
INTRA_ARTERIAL | Status: AC
  Filled 2019-06-07: qty 10

## 2019-06-07 MED ORDER — SODIUM CHLORIDE 0.9% FLUSH: 3.0000 mL | Freq: Two times a day (BID) | INTRAVENOUS | Status: DC

## 2019-06-07 MED ORDER — HEPARIN (PORCINE) IN NACL 1000-0.9 UT/500ML-% IV SOLN
INTRAVENOUS | Status: DC | PRN
  Administered 2019-06-07: 500 mL

## 2019-06-07 MED ORDER — HYDRALAZINE HCL 20 MG/ML IJ SOLN: 10.0000 mg | INTRAMUSCULAR | Status: AC | PRN

## 2019-06-07 MED ORDER — POTASSIUM CHLORIDE CRYS ER 20 MEQ PO TBCR: 20.0000 meq | EXTENDED_RELEASE_TABLET | Freq: Once | ORAL | Status: DC

## 2019-06-07 MED ORDER — ATROPINE SULFATE 1 MG/10ML IJ SOSY
PREFILLED_SYRINGE | INTRAMUSCULAR | Status: AC
  Administered 2019-06-07: 0.5 mg via INTRAVENOUS
  Filled 2019-06-07: qty 10

## 2019-06-07 MED ORDER — MIDAZOLAM HCL 2 MG/2ML IJ SOLN
INTRAMUSCULAR | Status: AC
  Filled 2019-06-07: qty 2

## 2019-06-07 MED ORDER — IOHEXOL 300 MG/ML  SOLN
INTRAMUSCULAR | Status: DC | PRN
  Administered 2019-06-07: 90 mL via INTRA_ARTERIAL

## 2019-06-07 MED ORDER — FENTANYL CITRATE (PF) 100 MCG/2ML IJ SOLN
INTRAMUSCULAR | Status: DC | PRN
  Administered 2019-06-07: 12.5 ug via INTRAVENOUS

## 2019-06-07 MED ORDER — BIVALIRUDIN BOLUS VIA INFUSION - CUPID
INTRAVENOUS | Status: DC | PRN
  Administered 2019-06-07: 56.925 mg via INTRAVENOUS

## 2019-06-07 MED ORDER — SODIUM CHLORIDE 0.9 % IV SOLN
INTRAVENOUS | Status: DC | PRN
  Administered 2019-06-07: 1.75 mg/kg/h via INTRAVENOUS

## 2019-06-07 MED ORDER — SODIUM CHLORIDE 0.9 % IV SOLN: INTRAVENOUS | Status: AC

## 2019-06-07 MED ORDER — POTASSIUM CHLORIDE CRYS ER 20 MEQ PO TBCR
20.0000 meq | EXTENDED_RELEASE_TABLET | Freq: Once | ORAL | Status: AC
  Administered 2019-06-07: 20 meq via ORAL
  Filled 2019-06-07: qty 1

## 2019-06-07 SURGICAL SUPPLY — 24 items
BALLN EUPHORA RX 2.5X12 (BALLOONS) ×2
BALLN ~~LOC~~ TREK RX 2.75X12 (BALLOONS) ×2
BALLOON EUPHORA RX 2.5X12 (BALLOONS) ×1 IMPLANT
BALLOON ~~LOC~~ TREK RX 2.75X12 (BALLOONS) ×1 IMPLANT
CATH INFINITI 5FR JL4 (CATHETERS) ×2 IMPLANT
CATH INFINITI JR4 5F (CATHETERS) ×2 IMPLANT
CATH SWANZ 7F THERMO (CATHETERS) ×2 IMPLANT
CATH VISTA GUIDE 6FR XBLAD3.5 (CATHETERS) ×2 IMPLANT
CATH VISTA GUIDE 6FR XBLAD4 (CATHETERS) ×2 IMPLANT
DEVICE CLOSURE MYNXGRIP 6/7F (Vascular Products) ×4 IMPLANT
DEVICE INFLAT 30 PLUS (MISCELLANEOUS) ×2 IMPLANT
DEVICE SAFEGUARD 24CM (GAUZE/BANDAGES/DRESSINGS) ×2 IMPLANT
GUIDEWIRE EMER 3M J .025X150CM (WIRE) ×2 IMPLANT
KIT MANI 3VAL PERCEP (MISCELLANEOUS) ×2 IMPLANT
KIT RIGHT HEART (MISCELLANEOUS) ×2 IMPLANT
NEEDLE PERC 18GX7CM (NEEDLE) ×2 IMPLANT
NEEDLE SMART 18G ACCESS (NEEDLE) ×2 IMPLANT
PACK CARDIAC CATH (CUSTOM PROCEDURE TRAY) ×2 IMPLANT
SHEATH AVANTI 5FR X 11CM (SHEATH) ×2 IMPLANT
SHEATH AVANTI 6FR X 11CM (SHEATH) ×2 IMPLANT
SHEATH AVANTI 7FRX11 (SHEATH) ×2 IMPLANT
STENT RESOLUTE ONYX 2.5X22 (Permanent Stent) ×2 IMPLANT
WIRE GUIDERIGHT .035X150 (WIRE) ×2 IMPLANT
WIRE INTUITION PROPEL ST 180CM (WIRE) ×2 IMPLANT

## 2019-06-07 NOTE — Progress Notes (Signed)
Progress Note  Patient Name: Stephanie Charles Date of Encounter: 06/07/2019  Primary Cardiologist: Rockey Situ  Subjective   Feels well this morning, just tired. Did not sleep well with nausea, vomiting, dizziness, and presyncope overnight. BP hypotensive overnight with systolic readings in to the 80s to 90s mmHg with heart rates into the 50s bpm. She was given 0.5 mg of Atropine around 1 AM. BP BP she had received Coreg 6.25 mg at 21:58, IV Lasix 40 mg at 11:24, hydralazine 50 mg x 2 with last dose at 21:58, HCTZ 25 mg 17:55, and losartan 100 mg at 17:54. Heart rates currently in the 60s bpm with BP 171/71.   Inpatient Medications    Scheduled Meds: . allopurinol  300 mg Oral Daily  . amitriptyline  20 mg Oral QHS  . aspirin EC  81 mg Oral Daily  . atorvastatin  40 mg Oral Daily  . carvedilol  6.25 mg Oral BID  . clopidogrel  75 mg Oral Daily  . fluticasone  2 spray Each Nare Daily  . hydrALAZINE  50 mg Oral TID  . hydrochlorothiazide  25 mg Oral Daily  . isosorbide mononitrate  30 mg Oral BH-q7a  . levothyroxine  137 mcg Oral Daily  . losartan  100 mg Oral Daily  . mirtazapine  30 mg Oral QHS  . multivitamin with minerals  1 tablet Oral Daily  . pantoprazole  40 mg Oral Daily  . sertraline  25 mg Oral Daily   Continuous Infusions: . heparin 1,100 Units/hr (06/07/19 0456)   PRN Meds: acetaminophen, albuterol, nitroGLYCERIN, ondansetron (ZOFRAN) IV   Vital Signs    Vitals:   06/07/19 0123 06/07/19 0229 06/07/19 0345 06/07/19 0722  BP: (!) 101/59 139/65 (!) 149/66 (!) 171/71  Pulse: 76 79 72 70  Resp:   18 16  Temp:   98.2 F (36.8 C) 97.8 F (36.6 C)  TempSrc:    Oral  SpO2: 95% 96% 96% 99%  Weight:   75.9 kg   Height:        Intake/Output Summary (Last 24 hours) at 06/07/2019 0749 Last data filed at 06/07/2019 0300 Gross per 24 hour  Intake 460.63 ml  Output -  Net 460.63 ml   Filed Weights   06/06/19 0941 06/06/19 1728 06/07/19 0345  Weight: 72.6 kg 76.3 kg  75.9 kg    Telemetry    SR, rare PVC, rare ventricular couplet, BBB - Personally Reviewed  ECG    Sinus bradycardia, 55 bpm, 1st degree AV block, rare PVC, LBBB (known since 2017) - Personally Reviewed  Physical Exam   GEN: No acute distress.   Neck: No JVD. Cardiac: RRR, no murmurs, rubs, or gallops.  Respiratory: Clear to auscultation bilaterally.  GI: Soft, nontender, non-distended.   MS: No edema; No deformity. Neuro:  Alert and oriented x 3; Nonfocal.  Psych: Normal affect.  Labs    Chemistry Recent Labs  Lab 06/06/19 0930 06/07/19 0301  NA 139 140  K 3.5 3.2*  CL 106 104  CO2 23 27  GLUCOSE 124* 129*  BUN 20 25*  CREATININE 1.03* 1.27*  CALCIUM 8.9 8.7*  GFRNONAA 49* 38*  GFRAA 57* 44*  ANIONGAP 10 9     Hematology Recent Labs  Lab 06/06/19 0930 06/07/19 0301  WBC 7.4 10.6*  RBC 3.95 3.86*  HGB 12.2 12.0  HCT 36.5 36.3  MCV 92.4 94.0  MCH 30.9 31.1  MCHC 33.4 33.1  RDW 14.5 14.7  PLT  150 154    Cardiac EnzymesNo results for input(s): TROPONINI in the last 168 hours. No results for input(s): TROPIPOC in the last 168 hours.   BNP Recent Labs  Lab 06/06/19 0930  BNP 246.0*     DDimer No results for input(s): DDIMER in the last 168 hours.   Radiology    Dg Chest Portable 1 View  Result Date: 06/06/2019 IMPRESSION: Hypoinflation of the lungs. Increased diffuse bilateral lung opacities are noted concerning for either edema or multifocal pneumonia. Aortic Atherosclerosis (ICD10-I70.0). Electronically Signed   By: Marijo Conception M.D.   On: 06/06/2019 09:55    Cardiac Studies   2D Echo 06/06/2019: -Formal report pending with preliminary report indicating an EF of ~ 35% __________  2D Echo 2018: INTERPRETATION NORMAL LEFT VENTRICULAR SYSTOLIC FUNCTION NORMAL RIGHT VENTRICULAR SYSTOLIC FUNCTION NO VALVULAR STENOSIS Closest EF: >55% (Estimated) Size: MILDLY ENLARGED Mitral: MILD MR Tricuspid: MILD TR __________  Myoview  2018: FINDINGS: Regional wall motion:reveals normal myocardial thickening and wall  motion. The overall quality of the study is good. Artifacts noted: no Left ventricular cavity: normal.  Perfusion Analysis:SPECT images demonstrate homogeneous tracer  distribution throughout the myocardium.  Patient Profile     85 y.o. female with history of NSTEMI in 2016 felt to possibly be in the setting of demand ischemia with hypertensive urgency, LBBB, TIA/CVA in 2016, CKD stage II, HTN, HLD, carotid artery disease, hypothyroidism, diverticulitis, anemia, anxiety, depression, and gout admitted with chest pain and accelerated HTN found to have elevated high sensitivity troponin peaking at 0000000 and acute systolic CHF.   Assessment & Plan    1. Acute systolic CHF: -She does not appear grossly volume up at this time -Discussed echo results with patient  -She prefers to proceed with [facility] over stress testing, though cannot exclude NICM in the setting of poorly controlled BP -Continue evidence based therapy with Coreg and losartan -Following cath would recommend transitioning from losartan to [facility] with the addition of spironolactone as well -Risks and benefits of cardiac catheterization have been discussed with the patient, and her daughter, including risks of bleeding, bruising, infection, kidney damage, stroke, heart attack, and death. The patient understands these risks and is willing to proceed with the procedure. All questions have been answered and concerns listened to  2. Elevated high sensitivity troponin: -Never with chest pain -Peaking at 296, now down trending -Preliminary report on echo with an EF ~ 35% as above -R/LHC as above -ASA -Lipitor -Lipid panel with an LDL of 45 this admission  3. Accelerated HTN: -BP remains elevated into the [id number] systolic this morning -Hypotensive overnight with systolic readings into the 80s to 90s mmHg, possibly following administration of  multiple antihypertensives as above -Continue Coreg, losartan, and HCTZ -Hold hydralazine and Imdur for now with plans to escalate evidence based therapy as above  4. Anemia: -Normal HGB this admission  5. Hypokalemia: -Replete to goal of 4.0  6. CKD stage II: -Likely in the setting of poorly controlled BP -Stable -Monitor   For questions or updates, please contact [facility] Please consult [email or web address] for contact info under Cardiology/STEMI.    Signed, Christell Faith, PA-C [place] Pager: [phone] 06/07/2019, 7:49 AM

## 2019-06-07 NOTE — Progress Notes (Signed)
[facility] NAME: Stephanie Charles    MR#:  [id number]  DATE OF BIRTH:  [birth year]  SUBJECTIVE:  patient feels a little better today. No chest pain. She had one-time low blood pressure after nitroglycerin was given to her. Daughter in the room. She is scheduled to get cardiac cath at 1:30 PM  REVIEW OF SYSTEMS:   Review of Systems  Constitutional: Negative for chills, fever and weight loss.  HENT: Negative for ear discharge, ear pain and nosebleeds.   Eyes: Negative for blurred vision, pain and discharge.  Respiratory: Negative for sputum production, shortness of breath, wheezing and stridor.   Cardiovascular: Negative for chest pain, palpitations, orthopnea and PND.  Gastrointestinal: Negative for abdominal pain, diarrhea, nausea and vomiting.  Genitourinary: Negative for frequency and urgency.  Musculoskeletal: Negative for back pain and joint pain.  Neurological: Positive for weakness. Negative for sensory change, speech change and focal weakness.  Psychiatric/Behavioral: Negative for depression and hallucinations. The patient is not nervous/anxious.    Tolerating Diet: yes Tolerating PT: not needed  DRUG ALLERGIES:   Allergies  Allergen Reactions  . Levofloxacin     Mouth sores  . Lisinopril     Cough?  . Povidone-Iodine Other (See Comments)    Severe blistering and itchiness. Severe redness  . Simvastatin     Myalgias  . Codeine Nausea And Vomiting    VITALS:  Blood pressure (!) 171/71, pulse 70, temperature 97.8 F (36.6 C), temperature source Oral, resp. rate 16, height 5\' 2"  (1.575 m), weight 75.9 kg, SpO2 99 %.  PHYSICAL EXAMINATION:   Physical Exam  GENERAL:  85 y.o.-year-old patient lying in the bed with no acute distress.  EYES: Pupils equal, round, reactive to light and accommodation. No scleral icterus. Extraocular muscles intact.  HEENT: Head atraumatic, normocephalic. Oropharynx and nasopharynx  clear.  NECK:  Supple, no jugular venous distention. No thyroid enlargement, no tenderness.  LUNGS: Normal breath sounds bilaterally, no wheezing, rales, rhonchi. No use of accessory muscles of respiration.  CARDIOVASCULAR: S1, S2 normal. No murmurs, rubs, or gallops.  ABDOMEN: Soft, nontender, nondistended. Bowel sounds present. No organomegaly or mass.  EXTREMITIES: No cyanosis, clubbing or edema b/l.    NEUROLOGIC: Cranial nerves II through XII are intact. No focal Motor or sensory deficits b/l.   PSYCHIATRIC:  patient is alert and oriented x 3.  SKIN: No obvious rash, lesion, or ulcer.   LABORATORY PANEL:  CBC Recent Labs  Lab 06/07/19 0301  WBC 10.6*  HGB 12.0  HCT 36.3  PLT 154    Chemistries  Recent Labs  Lab 06/06/19 1126 06/07/19 0301  NA  --  140  K  --  3.2*  CL  --  104  CO2  --  27  GLUCOSE  --  129*  BUN  --  25*  CREATININE  --  1.27*  CALCIUM  --  8.7*  MG 1.8  --    Cardiac Enzymes No results for input(s): TROPONINI in the last 168 hours. RADIOLOGY:  Dg Chest Portable 1 View  Result Date: 06/06/2019 CLINICAL DATA:  Chest pain. EXAM: PORTABLE CHEST 1 VIEW COMPARISON:  Radiographs of April 21, 2017. FINDINGS: Stable cardiomediastinal silhouette. Atherosclerosis of thoracic aorta is noted. Increased diffuse bilateral lung opacities are noted concerning for either edema or multifocal pneumonia. No pneumothorax or pleural effusion is noted. Bony thorax is unremarkable. Hypoinflation of the lungs is noted. IMPRESSION: Hypoinflation of the lungs. Increased  diffuse bilateral lung opacities are noted concerning for either edema or multifocal pneumonia. Aortic Atherosclerosis (ICD10-I70.0). Electronically Signed   By: Marijo Conception M.D.   On: 06/06/2019 09:55   ASSESSMENT AND PLAN:  Stephanie Charles  is a 85 y.o. female with a known history of hypertension, history of CAD (per patient), TIA, hypothyroidism Charles to the emergency room after she woke up this morning with  severe burning pain in the center of her chest. She took her blood pressure and was in the 200s  1.  unstable angina/Chest pain in the setting of malignant hypertension along with shortness of breath with mild elevated BNP and troponin -patients troponin trended up to 296. She was started on IV heparin drip. Plans for cardiac cath noted by Dr. Rockey Situ. -continue home medications -PRN hydralazine -cardiology consultation with Dr. Rockey Situ. Appreciated  2.  acute systolic congestive heart failure -patient received Lasix in the ER -I will hold off on further Lasix at present-- give as needed -echo of the heart-- EF 35% -continue other cardiac meds  3. Hypothyroidism continue Synthroid  4. Malignant hypertension -continue losartan,  carvedilol and HCTZ  Case discussed with Care Management/Social Worker. Management plans discussed with the patient, family and they are in agreement.  CODE STATUS: full  DVT Prophylaxis: heparin drip  TOTAL TIME TAKING CARE OF THIS PATIENT: *30 minutes* minutes.  >50% time spent on counselling and coordination of care  POSSIBLE D/C IN *1-2* DAYS, DEPENDING ON CLINICAL CONDITION.  Note: This dictation was prepared with Dragon dictation along with smaller phrase technology. Any transcriptional errors that result from this process are unintentional.  Fritzi Mandes M.D on 06/07/2019 at 1:42 PM  Between 7am to 6pm - Pager - [phone]  After 6pm go to [email or web address] - password [email or web address]  [facility]  Office  [phone]  CC: Primary care physician; Jerrol Banana., MDPatient ID: Stephanie Charles, female   DOB: 21-May-1930, 85 y.o.   MRN: [id number]

## 2019-06-07 NOTE — Progress Notes (Signed)
ANTICOAGULATION CONSULT NOTE -   Pharmacy Consult for Heparin  Indication: chest pain/ACS  Allergies  Allergen Reactions  . Levofloxacin     Mouth sores  . Lisinopril     Cough?  . Povidone-Iodine Other (See Comments)    Severe blistering and itchiness. Severe redness  . Simvastatin     Myalgias  . Codeine Nausea And Vomiting   Patient Measurements: Height: 5\' 2"  (157.5 cm) Weight: 168 lb 3.2 oz (76.3 kg) IBW/kg (Calculated) : 50.1 Heparin Dosing Weight: 65.6 kg   Vital Signs: Temp: 98.2 F (36.8 C) (09/03 0345) Temp Source: Oral (09/02 2359) BP: 149/66 (09/03 0345) Pulse Rate: 72 (09/03 0345)  Labs: Recent Labs    06/06/19 0930  06/06/19 1615 06/06/19 2243 06/07/19 0054 06/07/19 0301  HGB 12.2  --   --   --   --  12.0  HCT 36.5  --   --   --   --  36.3  PLT 150  --   --   --   --  154  HEPARINUNFRC  --   --   --   --   --  <0.10*  CREATININE 1.03*  --   --   --   --  1.27*  TROPONINIHS 30*   < > 296* 262* 215*  --    < > = values in this interval not displayed.    Estimated Creatinine Clearance: 30.4 mL/min (A) (by C-G formula based on SCr of 1.27 mg/dL (H)).   Medical History: Past Medical History:  Diagnosis Date  . Anemia   . Diverticulitis   . GERD (gastroesophageal reflux disease)   . Hyperlipidemia   . Hypertension   . Hypothyroidism   . Myocardial infarction ([place]) 08/2017  . TIA (transient ischemic attack)    1 approx 2015, 1 approx 2018  . Vertigo    last episode several months ago  . [name]     Medications:  Medications Prior to Admission  Medication Sig Dispense Refill Last Dose  . albuterol (PROVENTIL) (2.5 MG/3ML) 0.083% nebulizer solution Take 3 mLs (2.5 mg total) by nebulization every 4 (four) hours as needed. 75 mL 3 Unknown at PRN  . allopurinol (ZYLOPRIM) 300 MG tablet TAKE 1 TABLET BY MOUTH ONCE DAILY (Patient taking differently: Take 300 mg by mouth daily. ) 90 tablet 3 06/05/2019 at 0900  . amitriptyline  (ELAVIL) 10 MG tablet TAKE 2 TABLETS BY MOUTH AT BEDTIME (Patient taking differently: Take 20 mg by mouth at bedtime. ) 60 tablet 5 06/05/2019 at 2100  . aspirin 81 MG tablet Take 81 mg by mouth daily.    06/05/2019 at Unknown time  . atorvastatin (LIPITOR) 40 MG tablet TAKE 1 TABLET BY MOUTH ONCE DAILY (Patient taking differently: Take 40 mg by mouth daily. ) 90 tablet 3 06/05/2019 at Unknown time  . carvedilol (COREG) 6.25 MG tablet Take 1 tablet (6.25 mg total) by mouth 2 (two) times daily. 180 tablet 3 06/06/2019 at 0830  . clopidogrel (PLAVIX) 75 MG tablet Take 1 tablet by mouth once daily (Patient taking differently: Take 75 mg by mouth daily. ) 90 tablet 3 06/05/2019 at Unknown time  . fluticasone (FLONASE) 50 MCG/ACT nasal spray USE 2 SPRAY(S) IN EACH NOSTRIL ONCE DAILY (Patient taking differently: Place 2 sprays into both nostrils daily. ) 16 g 11 06/05/2019 at Unknown time  . hydrALAZINE (APRESOLINE) 50 MG tablet Take 1 tablet (50 mg total) by mouth 3 (three)  times daily. 270 tablet 1 06/06/2019 at 0830  . hydrochlorothiazide (HYDRODIURIL) 25 MG tablet Take 1 tablet by mouth once daily (Patient taking differently: Take 25 mg by mouth daily. ) 90 tablet 0 06/05/2019 at Unknown time  . isosorbide mononitrate (IMDUR) 30 MG 24 hr tablet Take 1 tablet (30 mg total) by mouth every morning. 30 tablet 12 06/05/2019 at Unknown time  . levothyroxine (SYNTHROID) 137 MCG tablet Take 1 tablet by mouth once daily (Patient taking differently: Take 137 mcg by mouth daily. ) 90 tablet 3 06/05/2019 at Unknown time  . losartan (COZAAR) 100 MG tablet TAKE 1 TABLET BY MOUTH ONCE DAILY (Patient taking differently: Take 100 mg by mouth daily. ) 90 tablet 3 06/05/2019 at Unknown time  . mirtazapine (REMERON) 30 MG tablet TAKE 1 TABLET BY MOUTH AT BEDTIME (Patient taking differently: Take 30 mg by mouth at bedtime. ) 90 tablet 0 06/05/2019 at 2100  . Multiple Vitamin (MULTIVITAMIN) capsule Take 1 capsule by mouth daily.   06/05/2019 at Unknown  time  . pantoprazole (PROTONIX) 40 MG tablet Take 1 tablet (40 mg total) by mouth daily. 30 tablet 5 06/05/2019 at Unknown time  . sertraline (ZOLOFT) 25 MG tablet Take 1 tablet by mouth once daily (Patient taking differently: Take 25 mg by mouth daily. ) 90 tablet 3 06/05/2019 at Unknown time    Assessment: Pharmacy consulted to dose heparin in this 85 year old female with ACS/NSTEMI.  MD does not want bolus to be given.  CrCl = 37.5 ml/min  0903 @ 0301 HL = < 0.10, subtherapeutic - confirmed w/ RN no issues w/ infusion  Goal of Therapy:  Heparin level 0.3-0.7 units/ml Monitor platelets by anticoagulation protocol: Yes   Plan:  No bolus per MD.  Will increase Heparin infusion to 1100 units/hr Recheck heparin level 8 hours after rate increase Monitor for s/sx bleeding complications  Hart Robinsons A 06/07/2019,4:41 AM

## 2019-06-07 NOTE — Progress Notes (Signed)
ANTICOAGULATION CONSULT NOTE -   Pharmacy Consult for Heparin  Indication: chest pain/ACS  Allergies  Allergen Reactions  . Levofloxacin     Mouth sores  . Lisinopril     Cough?  . Povidone-Iodine Other (See Comments)    Severe blistering and itchiness. Severe redness  . Simvastatin     Myalgias  . Codeine Nausea And Vomiting   Patient Measurements: Height: 5\' 2"  (157.5 cm) Weight: 167 lb 5.3 oz (75.9 kg) IBW/kg (Calculated) : 50.1 Heparin Dosing Weight: 65.6 kg   Vital Signs: Temp: 97.6 F (36.4 C) (09/03 1409) Temp Source: Oral (09/03 1409) BP: 184/65 (09/03 1409) Pulse Rate: 61 (09/03 1409)  Labs: Recent Labs    06/06/19 0930  06/06/19 1615 06/06/19 2243 06/07/19 0054 06/07/19 0301 06/07/19 1331  HGB 12.2  --   --   --   --  12.0  --   HCT 36.5  --   --   --   --  36.3  --   PLT 150  --   --   --   --  154  --   HEPARINUNFRC  --   --   --   --   --  <0.10* 0.23*  CREATININE 1.03*  --   --   --   --  1.27*  --   TROPONINIHS 30*   < > 296* 262* 215*  --   --    < > = values in this interval not displayed.    Estimated Creatinine Clearance: 30.3 mL/min (A) (by C-G formula based on SCr of 1.27 mg/dL (H)).   Medical History: Past Medical History:  Diagnosis Date  . Anemia   . Diverticulitis   . GERD (gastroesophageal reflux disease)   . Hyperlipidemia   . Hypertension   . Hypothyroidism   . Myocardial infarction ([place]) 08/2017  . TIA (transient ischemic attack)    1 approx 2015, 1 approx 2018  . Vertigo    last episode several months ago  . [name]     Medications:  Medications Prior to Admission  Medication Sig Dispense Refill Last Dose  . albuterol (PROVENTIL) (2.5 MG/3ML) 0.083% nebulizer solution Take 3 mLs (2.5 mg total) by nebulization every 4 (four) hours as needed. 75 mL 3 Unknown at PRN  . allopurinol (ZYLOPRIM) 300 MG tablet TAKE 1 TABLET BY MOUTH ONCE DAILY (Patient taking differently: Take 300 mg by mouth daily. )  90 tablet 3 06/05/2019 at 0900  . amitriptyline (ELAVIL) 10 MG tablet TAKE 2 TABLETS BY MOUTH AT BEDTIME (Patient taking differently: Take 20 mg by mouth at bedtime. ) 60 tablet 5 06/05/2019 at 2100  . aspirin 81 MG tablet Take 81 mg by mouth daily.    06/05/2019 at Unknown time  . atorvastatin (LIPITOR) 40 MG tablet TAKE 1 TABLET BY MOUTH ONCE DAILY (Patient taking differently: Take 40 mg by mouth daily. ) 90 tablet 3 06/05/2019 at Unknown time  . carvedilol (COREG) 6.25 MG tablet Take 1 tablet (6.25 mg total) by mouth 2 (two) times daily. 180 tablet 3 06/06/2019 at 0830  . clopidogrel (PLAVIX) 75 MG tablet Take 1 tablet by mouth once daily (Patient taking differently: Take 75 mg by mouth daily. ) 90 tablet 3 06/05/2019 at Unknown time  . fluticasone (FLONASE) 50 MCG/ACT nasal spray USE 2 SPRAY(S) IN EACH NOSTRIL ONCE DAILY (Patient taking differently: Place 2 sprays into both nostrils daily. ) 16 g 11 06/05/2019 at Unknown  time  . hydrALAZINE (APRESOLINE) 50 MG tablet Take 1 tablet (50 mg total) by mouth 3 (three) times daily. 270 tablet 1 06/06/2019 at 0830  . hydrochlorothiazide (HYDRODIURIL) 25 MG tablet Take 1 tablet by mouth once daily (Patient taking differently: Take 25 mg by mouth daily. ) 90 tablet 0 06/05/2019 at Unknown time  . isosorbide mononitrate (IMDUR) 30 MG 24 hr tablet Take 1 tablet (30 mg total) by mouth every morning. 30 tablet 12 06/05/2019 at Unknown time  . levothyroxine (SYNTHROID) 137 MCG tablet Take 1 tablet by mouth once daily (Patient taking differently: Take 137 mcg by mouth daily. ) 90 tablet 3 06/05/2019 at Unknown time  . losartan (COZAAR) 100 MG tablet TAKE 1 TABLET BY MOUTH ONCE DAILY (Patient taking differently: Take 100 mg by mouth daily. ) 90 tablet 3 06/05/2019 at Unknown time  . mirtazapine (REMERON) 30 MG tablet TAKE 1 TABLET BY MOUTH AT BEDTIME (Patient taking differently: Take 30 mg by mouth at bedtime. ) 90 tablet 0 06/05/2019 at 2100  . Multiple Vitamin (MULTIVITAMIN) capsule Take 1  capsule by mouth daily.   06/05/2019 at Unknown time  . pantoprazole (PROTONIX) 40 MG tablet Take 1 tablet (40 mg total) by mouth daily. 30 tablet 5 06/05/2019 at Unknown time  . sertraline (ZOLOFT) 25 MG tablet Take 1 tablet by mouth once daily (Patient taking differently: Take 25 mg by mouth daily. ) 90 tablet 3 06/05/2019 at Unknown time    Assessment: Pharmacy consulted to dose heparin in this 85 year old female with ACS/NSTEMI.  MD does not want bolus to be given.  CrCl = 37.5 ml/min  0903 @ 0301 HL = < 0.10, subtherapeutic - confirmed w/ RN no issues w/ infusion 0903 1331 HL 0.23  Goal of Therapy:  Heparin level 0.3-0.7 units/ml Monitor platelets by anticoagulation protocol: Yes   Plan:  No bolus to be given per MD. Heparin level subtherapeutic.  Will increase Heparin infusion to 1250 units/hr Recheck heparin level 8 hours after rate increase Monitor for s/sx bleeding complications  Oswald Hillock 06/07/2019,2:32 PM

## 2019-06-08 ENCOUNTER — Telehealth: Payer: Self-pay | Admitting: Cardiovascular Disease

## 2019-06-08 ENCOUNTER — Encounter: Payer: Self-pay | Admitting: Cardiovascular Disease

## 2019-06-08 LAB — CBC
HCT: 35.4 % — ABNORMAL LOW (ref 36.0–46.0)
Hemoglobin: 11.7 g/dL — ABNORMAL LOW (ref 12.0–15.0)
MCH: 31 pg (ref 26.0–34.0)
MCHC: 33.1 g/dL (ref 30.0–36.0)
MCV: 93.9 fL (ref 80.0–100.0)
Platelets: 157 10*3/uL (ref 150–400)
RBC: 3.77 MIL/uL — ABNORMAL LOW (ref 3.87–5.11)
RDW: 14.6 % (ref 11.5–15.5)
WBC: 9.1 10*3/uL (ref 4.0–10.5)
nRBC: 0 % (ref 0.0–0.2)

## 2019-06-08 LAB — BASIC METABOLIC PANEL
Anion gap: 9 (ref 5–15)
BUN: 26 mg/dL — ABNORMAL HIGH (ref 8–23)
CO2: 26 mmol/L (ref 22–32)
Calcium: 8.6 mg/dL — ABNORMAL LOW (ref 8.9–10.3)
Chloride: 107 mmol/L (ref 98–111)
Creatinine, Ser: 1.33 mg/dL — ABNORMAL HIGH (ref 0.44–1.00)
GFR calc Af Amer: 42 mL/min — ABNORMAL LOW (ref >60)
GFR calc non Af Amer: 36 mL/min — ABNORMAL LOW (ref >60)
Glucose, Bld: 112 mg/dL — ABNORMAL HIGH (ref 70–99)
Potassium: 3.8 mmol/L (ref 3.5–5.1)
Sodium: 142 mmol/L (ref 135–145)

## 2019-06-08 LAB — POCT ACTIVATED CLOTTING TIME: Activated Clotting Time: 417 seconds

## 2019-06-08 MED ORDER — CARVEDILOL 12.5 MG PO TABS: 12.5000 mg | ORAL_TABLET | Freq: Two times a day (BID) | ORAL | 0 refills | Status: DC

## 2019-06-08 MED ORDER — CARVEDILOL 12.5 MG PO TABS: 12.5000 mg | ORAL_TABLET | Freq: Two times a day (BID) | ORAL | Status: DC

## 2019-06-08 MED ORDER — ACETAMINOPHEN 325 MG PO TABS: 650.0000 mg | ORAL_TABLET | Freq: Four times a day (QID) | ORAL | Status: DC | PRN

## 2019-06-08 MED ORDER — CARVEDILOL 6.25 MG PO TABS: 6.2500 mg | ORAL_TABLET | Freq: Once | ORAL | Status: DC

## 2019-06-08 MED ORDER — ASPIRIN 81 MG PO TBEC: 81.0000 mg | DELAYED_RELEASE_TABLET | Freq: Every day | ORAL | Status: DC

## 2019-06-08 NOTE — Telephone Encounter (Signed)
Spoke with patients daughter per her approval and she states that since getting home she has developed nausea and just doesn't feel well. She denies chest pain, denies shortness of breath, and denies diaphoresis. Advised that she should rest, limit to clear liquids, daughter wanted to give her emetrol and requested that she have pharmacy to review with her current meds for any interactions. Reviewed signs and symptoms that would be concerning and require immediate evaluation. She verbalized understanding and states that for now it is just plain nausea. Instructed her to please give [name] a call back if she has any further problems, questions, or concerns. She was appreciative for the call with no further questions at this time.

## 2019-06-08 NOTE — Discharge Instructions (Signed)
Follow-up with primary care physician in 3 to 4 days Follow-up with cardiology Dr. Rockey Situ  in 7 to 10 days

## 2019-06-08 NOTE — Plan of Care (Signed)
Discharge instructions provided to pt.  All questions addressed.  Understanding verified through teach back.  Awaiting transportation home via POV.   Problem: Education: Goal: Knowledge of General Education information will improve Description: Including pain rating scale, medication(s)/side effects and non-pharmacologic comfort measures 06/08/2019 1313 by Aubery Lapping, RN Outcome: Completed/Met 06/08/2019 1146 by Aubery Lapping, RN Outcome: Progressing   Problem: Health Behavior/Discharge Planning: Goal: Ability to manage health-related needs will improve 06/08/2019 1313 by Aubery Lapping, RN Outcome: Completed/Met 06/08/2019 1146 by Aubery Lapping, RN Outcome: Progressing   Problem: Clinical Measurements: Goal: Ability to maintain clinical measurements within normal limits will improve 06/08/2019 1313 by Aubery Lapping, RN Outcome: Completed/Met 06/08/2019 1146 by Aubery Lapping, RN Outcome: Progressing Goal: Will remain free from infection 06/08/2019 1313 by Aubery Lapping, RN Outcome: Completed/Met 06/08/2019 1146 by Aubery Lapping, RN Outcome: Progressing Goal: Diagnostic test results will improve 06/08/2019 1313 by Aubery Lapping, RN Outcome: Completed/Met 06/08/2019 1146 by Aubery Lapping, RN Outcome: Progressing Goal: Respiratory complications will improve 06/08/2019 1313 by Aubery Lapping, RN Outcome: Completed/Met 06/08/2019 1146 by Aubery Lapping, RN Outcome: Progressing Goal: Cardiovascular complication will be avoided 06/08/2019 1313 by Aubery Lapping, RN Outcome: Completed/Met 06/08/2019 1146 by Aubery Lapping, RN Outcome: Progressing   Problem: Activity: Goal: Risk for activity intolerance will decrease 06/08/2019 1313 by Aubery Lapping, RN Outcome: Completed/Met 06/08/2019 1146 by Aubery Lapping, RN Outcome: Progressing   Problem: Nutrition: Goal: Adequate nutrition will be maintained 06/08/2019 1313  by Aubery Lapping, RN Outcome: Completed/Met 06/08/2019 1146 by Aubery Lapping, RN Outcome: Progressing   Problem: Coping: Goal: Level of anxiety will decrease 06/08/2019 1313 by Aubery Lapping, RN Outcome: Completed/Met 06/08/2019 1146 by Aubery Lapping, RN Outcome: Progressing   Problem: Elimination: Goal: Will not experience complications related to bowel motility 06/08/2019 1313 by Aubery Lapping, RN Outcome: Completed/Met 06/08/2019 1146 by Aubery Lapping, RN Outcome: Progressing Goal: Will not experience complications related to urinary retention 06/08/2019 1313 by Aubery Lapping, RN Outcome: Completed/Met 06/08/2019 1146 by Aubery Lapping, RN Outcome: Progressing   Problem: Pain Managment: Goal: General experience of comfort will improve 06/08/2019 1313 by Aubery Lapping, RN Outcome: Completed/Met 06/08/2019 1146 by Aubery Lapping, RN Outcome: Progressing   Problem: Safety: Goal: Ability to remain free from injury will improve 06/08/2019 1313 by Aubery Lapping, RN Outcome: Completed/Met 06/08/2019 1146 by Aubery Lapping, RN Outcome: Progressing   Problem: Skin Integrity: Goal: Risk for impaired skin integrity will decrease 06/08/2019 1313 by Aubery Lapping, RN Outcome: Completed/Met 06/08/2019 1146 by Aubery Lapping, RN Outcome: Progressing   Problem: Education: Goal: Understanding of cardiac disease, CV risk reduction, and recovery process will improve 06/08/2019 1313 by Aubery Lapping, RN Outcome: Completed/Met 06/08/2019 1146 by Aubery Lapping, RN Outcome: Progressing Goal: Individualized Educational Video(s) 06/08/2019 1313 by Aubery Lapping, RN Outcome: Completed/Met 06/08/2019 1146 by Aubery Lapping, RN Outcome: Progressing   Problem: Activity: Goal: Ability to tolerate increased activity will improve 06/08/2019 1313 by Aubery Lapping, RN Outcome: Completed/Met 06/08/2019 1146 by  Aubery Lapping, RN Outcome: Progressing   Problem: Cardiac: Goal: Ability to achieve and maintain adequate cardiovascular perfusion will improve 06/08/2019 1313 by Aubery Lapping, RN Outcome: Completed/Met 06/08/2019 1146 by Aubery Lapping, RN Outcome: Progressing   Problem: Health Behavior/Discharge Planning: Goal: Ability to safely manage health-related needs after discharge will improve 06/08/2019 1313 by  Aubery Lapping, RN Outcome: Completed/Met 06/08/2019 1146 by Aubery Lapping, RN Outcome: Progressing

## 2019-06-08 NOTE — Discharge Summary (Signed)
[place] [facility] [place] NAME: Stephanie Charles    MR#:  [id number]  DATE OF BIRTH:  21-Jun-1930  DATE OF ADMISSION:  06/06/2019 ADMITTING PHYSICIAN: Fritzi Mandes, MD  DATE OF DISCHARGE: 06/08/19   PRIMARY CARE PHYSICIAN: Jerrol Banana., MD    ADMISSION DIAGNOSIS:  Acute pulmonary edema (HCC) [J81.0] Atypical chest pain [R07.89] Benign essential HTN [I10] Hyperlipidemia, unspecified hyperlipidemia type [E78.5]  DISCHARGE DIAGNOSIS:  Active Problems:   Chest pain   Unstable angina (HCC)   Non-ST elevation (NSTEMI) myocardial infarction [facility])   SECONDARY DIAGNOSIS:   Past Medical History:  Diagnosis Date  . Anemia   . Diverticulitis   . GERD (gastroesophageal reflux disease)   . Hyperlipidemia   . Hypertension   . Hypothyroidism   . Myocardial infarction ([place]) 08/2017  . TIA (transient ischemic attack)    1 approx 2015, 1 approx 2018  . Vertigo    last episode several months ago  . [name]     HOSPITAL COURSE:  HP I  Natayla Tussing  is a 85 y.o. female with a known history of hypertension, history of CAD (per patient), TIA, hypothyroidism comes to the emergency room after she woke up this morning with severe burning pain in the center of her chest. She took her blood pressure and was in the 200s. Patient started also noticing some shortness of breath. She also has noticed some leg edema during the midday in both her ankles. She takes hydrochlorothiazide on a daily basis.  During my evaluation she is chest pain-free. Her blood pressure is 163/70. She denies any symptoms at present.  Given multiple risk factors and chest pain along with malignant hypertension patient is being admitted there evaluation management.  BNP was 246. She received IV Lasix 40 mg times one  1. NSTEMI  in the setting of malignant hypertension along with shortness of breath with mild elevated BNP and troponin -patients troponin  trended up to 296. She was started on IV heparin drip.  -cardiac cath done 06/07/19 - .with severe proximal to mid LAD disease, stent placed -PRN hydralazine given for blood pressure control -Outpatient follow-up with Dr. Candis Musa in 1 to 2 weeks Okay to discharge patient from cardiology standpoint  2. acute systolic congestive heart failure -patient received Lasix in the ER -I will hold off on further Lasix at present--give as needed -echo of the heart-- EF 35% -continue other cardiac meds  3.Hypothyroidism continue Synthroid  4.Malignant hypertension -continue losartan,  carvedilol dose increased to 12.5 mg p.o. twice daily and HCTZ -Discontinue Imdur and hydralazine per cardiology recommendations appreciate cardiology recommendations  Plan of care discussed with the patient and daughter at bedside they both verbalized understanding of the plan  DISCHARGE CONDITIONS:   fair  CONSULTS OBTAINED:  Treatment Team:  [name], MD Arvil Chaco, PA-C   PROCEDURES  Cardiac cath  DRUG ALLERGIES:   Allergies  Allergen Reactions  . Latex Itching  . Levofloxacin     Mouth sores  . Lisinopril     Cough?  . Povidone-Iodine Other (See Comments)    Severe blistering and itchiness. Severe redness  . Simvastatin     Myalgias  . Codeine Nausea And Vomiting    DISCHARGE MEDICATIONS:   Allergies as of 06/08/2019      Reactions   Latex Itching   Levofloxacin    Mouth sores   Lisinopril    Cough?  Povidone-iodine Other (See Comments)   Severe blistering and itchiness. Severe redness   Simvastatin    Myalgias   Codeine Nausea And Vomiting      Medication List    STOP taking these medications   aspirin 81 MG tablet Replaced by: aspirin 81 MG EC tablet   hydrALAZINE 50 MG tablet Commonly known as: APRESOLINE   isosorbide mononitrate 30 MG 24 hr tablet Commonly known as: Imdur     TAKE these medications   acetaminophen 325 MG tablet Commonly  known as: TYLENOL Take 2 tablets (650 mg total) by mouth every 6 (six) hours as needed for mild pain or headache.   albuterol (2.5 MG/3ML) 0.083% nebulizer solution Commonly known as: PROVENTIL Take 3 mLs (2.5 mg total) by nebulization every 4 (four) hours as needed.   allopurinol 300 MG tablet Commonly known as: ZYLOPRIM TAKE 1 TABLET BY MOUTH ONCE DAILY   amitriptyline 10 MG tablet Commonly known as: ELAVIL TAKE 2 TABLETS BY MOUTH AT BEDTIME   aspirin 81 MG EC tablet Take 1 tablet (81 mg total) by mouth daily. Start taking on: June 09, 2019 Replaces: aspirin 81 MG tablet   atorvastatin 40 MG tablet Commonly known as: LIPITOR TAKE 1 TABLET BY MOUTH ONCE DAILY   carvedilol 12.5 MG tablet Commonly known as: COREG Take 1 tablet (12.5 mg total) by mouth 2 (two) times daily. What changed:   medication strength  how much to take   clopidogrel 75 MG tablet Commonly known as: PLAVIX Take 1 tablet by mouth once daily   fluticasone 50 MCG/ACT nasal spray Commonly known as: FLONASE USE 2 SPRAY(S) IN EACH NOSTRIL ONCE DAILY What changed: See the new instructions.   hydrochlorothiazide 25 MG tablet Commonly known as: HYDRODIURIL Take 1 tablet by mouth once daily   levothyroxine 137 MCG tablet Commonly known as: SYNTHROID Take 1 tablet by mouth once daily   losartan 100 MG tablet Commonly known as: COZAAR TAKE 1 TABLET BY MOUTH ONCE DAILY   mirtazapine 30 MG tablet Commonly known as: REMERON TAKE 1 TABLET BY MOUTH AT BEDTIME   multivitamin capsule Take 1 capsule by mouth daily.   pantoprazole 40 MG tablet Commonly known as: PROTONIX Take 1 tablet (40 mg total) by mouth daily.   sertraline 25 MG tablet Commonly known as: ZOLOFT Take 1 tablet by mouth once daily        DISCHARGE INSTRUCTIONS:   Follow-up with primary care physician in 3 to 4 days Follow-up with cardiology Dr. Rockey Situ  in 7 to 10 days   DIET:  Cardiac diet  DISCHARGE CONDITION:   Fair  ACTIVITY:  Activity as tolerated  OXYGEN:  Home Oxygen: No.   Oxygen Delivery: room air  DISCHARGE LOCATION:  home   If you experience worsening of your admission symptoms, develop shortness of breath, life threatening emergency, suicidal or homicidal thoughts you must seek medical attention immediately by calling 911 or calling your MD immediately  if symptoms less severe.  You Must read complete instructions/literature along with all the possible adverse reactions/side effects for all the Medicines you take and that have been prescribed to you. Take any new Medicines after you have completely understood and accpet all the possible adverse reactions/side effects.   Please note  You were cared for by a hospitalist during your hospital stay. If you have any questions about your discharge medications or the care you received while you were in the hospital after you are discharged, you can call the  unit and asked to speak with the hospitalist on call if the hospitalist that took care of you is not available. Once you are discharged, your primary care physician will handle any further medical issues. Please note that NO REFILLS for any discharge medications will be authorized once you are discharged, as it is imperative that you return to your primary care physician (or establish a relationship with a primary care physician if you do not have one) for your aftercare needs so that they can reassess your need for medications and monitor your lab values.     Today  Chief Complaint  Patient presents with  . Chest Pain  . Shortness of Breath   Patient is resting comfortably.  Denies any chest pain or shortness of breath.  Daughter at bedside.  Wants to go home  ROS:  CONSTITUTIONAL: Denies fevers, chills. Denies any fatigue, weakness.  EYES: Denies blurry vision, double vision, eye pain. EARS, NOSE, THROAT: Denies tinnitus, ear pain, hearing loss. RESPIRATORY: Denies cough, wheeze,  shortness of breath.  CARDIOVASCULAR: Denies chest pain, palpitations, edema.  GASTROINTESTINAL: Denies nausea, vomiting, diarrhea, abdominal pain. Denies bright red blood per rectum. GENITOURINARY: Denies dysuria, hematuria. ENDOCRINE: Denies nocturia or thyroid problems. HEMATOLOGIC AND LYMPHATIC: Denies easy bruising or bleeding. SKIN: Denies rash or lesion. MUSCULOSKELETAL: Denies pain in neck, back, shoulder, knees, hips or arthritic symptoms.  NEUROLOGIC: Denies paralysis, paresthesias.  PSYCHIATRIC: Denies anxiety or depressive symptoms.   VITAL SIGNS:  Blood pressure (!) 146/53, pulse 73, temperature 98.3 F (36.8 C), temperature source Oral, resp. rate 19, height 5\' 2"  (1.575 m), weight 73 kg, SpO2 93 %.  I/O:    Intake/Output Summary (Last 24 hours) at 06/08/2019 1155 Last data filed at 06/08/2019 0457 Gross per 24 hour  Intake 206.67 ml  Output 250 ml  Net -43.33 ml    PHYSICAL EXAMINATION:  GENERAL:  85 y.o.-year-old patient lying in the bed with no acute distress.  EYES: Pupils equal, round, reactive to light and accommodation. No scleral icterus. Extraocular muscles intact.  HEENT: Head atraumatic, normocephalic. Oropharynx and nasopharynx clear.  NECK:  Supple, no jugular venous distention. No thyroid enlargement, no tenderness.  LUNGS: Normal breath sounds bilaterally, no wheezing, rales,rhonchi or crepitation. No use of accessory muscles of respiration.  CARDIOVASCULAR: S1, S2 normal. No murmurs, rubs, or gallops.  ABDOMEN: Soft, non-tender, non-distended. Bowel sounds present.  Right groin with pressure dressing, no bleeding from the cath site EXTREMITIES: No pedal edema, cyanosis, or clubbing.  NEUROLOGIC: Cranial nerves II through XII are intact. Muscle strength at her baseline in all extremities. Sensation intact. Gait not checked.  PSYCHIATRIC: The patient is alert and oriented x 3.  SKIN: No obvious rash, lesion, or ulcer.   DATA REVIEW:   CBC Recent Labs   Lab 06/08/19 0511  WBC 9.1  HGB 11.7*  HCT 35.4*  PLT 157    Chemistries  Recent Labs  Lab 06/06/19 1126  06/08/19 0511  NA  --    < > 142  K  --    < > 3.8  CL  --    < > 107  CO2  --    < > 26  GLUCOSE  --    < > 112*  BUN  --    < > 26*  CREATININE  --    < > 1.33*  CALCIUM  --    < > 8.6*  MG 1.8  --   --    < > =  values in this interval not displayed.    Cardiac Enzymes No results for input(s): TROPONINI in the last 168 hours.  Microbiology Results  Results for orders placed or performed during the hospital encounter of 06/06/19  SARS Coronavirus 2 [facility] order, Performed in [facility] hospital lab) Nasopharyngeal Nasopharyngeal Swab     Status: None   Collection Time: 06/06/19  9:30 AM   Specimen: Nasopharyngeal Swab  Result Value Ref Range Status   SARS Coronavirus 2 NEGATIVE NEGATIVE Final    Comment: (NOTE) If result is NEGATIVE SARS-CoV-2 target nucleic acids are NOT DETECTED. The SARS-CoV-2 RNA is generally detectable in upper and lower  respiratory specimens during the acute phase of infection. The lowest  concentration of SARS-CoV-2 viral copies this assay can detect is 250  copies / mL. A negative result does not preclude SARS-CoV-2 infection  and should not be used as the sole basis for treatment or other  patient management decisions.  A negative result may occur with  improper specimen collection / handling, submission of specimen other  than nasopharyngeal swab, presence of viral mutation(s) within the  areas targeted by this assay, and inadequate number of viral copies  (<250 copies / mL). A negative result must be combined with clinical  observations, patient history, and epidemiological information. If result is POSITIVE SARS-CoV-2 target nucleic acids are DETECTED. The SARS-CoV-2 RNA is generally detectable in upper and lower  respiratory specimens dur ing the acute phase of infection.  Positive  results are indicative of active  infection with SARS-CoV-2.  Clinical  correlation with patient history and other diagnostic information is  necessary to determine patient infection status.  Positive results do  not rule out bacterial infection or co-infection with other viruses. If result is PRESUMPTIVE POSTIVE SARS-CoV-2 nucleic acids MAY BE PRESENT.   A presumptive positive result was obtained on the submitted specimen  and confirmed on repeat testing.  While 2019 novel coronavirus  (SARS-CoV-2) nucleic acids may be present in the submitted sample  additional confirmatory testing may be necessary for epidemiological  and / or clinical management purposes  to differentiate between  SARS-CoV-2 and other Sarbecovirus currently known to infect humans.  If clinically indicated additional testing with an alternate test  methodology [phone]) is advised. The SARS-CoV-2 RNA is generally  detectable in upper and lower respiratory sp ecimens during the acute  phase of infection. The expected result is Negative. Fact Sheet for Patients:  StrictlyIdeas.no Fact Sheet for Healthcare Providers: BankingDealers.co.za This test is not yet approved or cleared by the [place] FDA and has been authorized for detection and/or diagnosis of SARS-CoV-2 by FDA under an Emergency Use Authorization (EUA).  This EUA will remain in effect (meaning this test can be used) for the duration of the COVID-19 declaration under Section 564(b)(1) of the Act, 21 U.S.C. section 360bbb-3(b)(1), unless the authorization is terminated or revoked sooner. Performed at [facility], [address] [place], [place] [address]     RADIOLOGY:  Dg Chest Portable 1 View  Result Date: 06/06/2019 CLINICAL DATA:  Chest pain. EXAM: PORTABLE CHEST 1 VIEW COMPARISON:  Radiographs of April 21, 2017. FINDINGS: Stable cardiomediastinal silhouette. Atherosclerosis of thoracic aorta is noted. Increased diffuse  bilateral lung opacities are noted concerning for either edema or multifocal pneumonia. No pneumothorax or pleural effusion is noted. Bony thorax is unremarkable. Hypoinflation of the lungs is noted. IMPRESSION: Hypoinflation of the lungs. Increased diffuse bilateral lung opacities are noted concerning for either edema or multifocal  pneumonia. Aortic Atherosclerosis (ICD10-I70.0). Electronically Signed   By: Marijo Conception M.D.   On: 06/06/2019 09:55    EKG:   Orders placed or performed during the hospital encounter of 06/06/19  . ED EKG  . ED EKG  . EKG 12-Lead  . EKG 12-Lead  . EKG 12-Lead (recurrent chest pain)  . EKG 12-Lead  . EKG 12-Lead  . EKG 12-Lead  . EKG 12-Lead  . EKG 12-Lead immediately post procedure  . EKG 12-Lead  . EKG 12-Lead immediately post procedure  . EKG 12-Lead      Management plans discussed with the patient, family and they are in agreement.  CODE STATUS:     Code Status Orders  (From admission, onward)         Start     Ordered   06/06/19 1415  Full code  Continuous     06/06/19 1414        Code Status History    This patient has a current code status but no historical code status.   Advance Care Planning Activity    Advance Directive Documentation     Most Recent Value  Type of Advance Directive  Healthcare Power of Attorney, Living will  Pre-existing out of facility DNR order (yellow form or pink MOST form)  -  "MOST" Form in Place?  -      TOTAL TIME TAKING CARE OF THIS PATIENT:  45  minutes.   Note: This dictation was prepared with Dragon dictation along with smaller phrase technology. Any transcriptional errors that result from this process are unintentional.   @MEC @  on 06/08/2019 at 11:55 AM  Between 7am to 6pm - Pager - [phone]  After 6pm go to [email or web address] - password [email or web address] [facility]  [facility]  Office  [phone]  CC: Primary care physician; Jerrol Banana., MD

## 2019-06-08 NOTE — Progress Notes (Signed)
Cardiovascular and Pulmonary Nurse Navigator Note:   85 y.o.femalewith a known history of hypertension, history of CAD(per patient),TIA, hypothyroidism who presebted ti the ED with chest pain.   Upon presentation to the ED patient's SBP was in the 200s. Patient also SOB.  Patient reported some lower extremity edema during the past week.  BNP 246.  Patient received one dose of IV Lasix in the ER.  Patient rule din for NTEMI in the settig of malignant HTN along with SOB and mild elevated BNP.  Patient's troponin peaked at 296.  Patient underwent Cardiac Cath on 06/07/2019.    Stephanie Merritts, MD (Primary)  [name], PA-C  Procedures  RIGHT/LEFT HEART CATH AND CORONARY ANGIOGRAPHY  Conclusion   Prox LAD lesion is 90% stenosed.  Prox Cx to Mid Cx lesion is 40% stenosed.  Prox RCA lesion is 30% stenosed.    [name], MD (Primary)  [name], PA-C  Procedures  CORONARY STENT INTERVENTION  Conclusion    Mid LAD lesion is 95% stenosed.  Post intervention, there is a 0% residual stenosis.  A drug-eluting stent was successfully placed using a STENT RESOLUTE ONYX 2.5X22.  2nd Diag lesion is 30% stenosed.   Successful angioplasty and drug-eluting stent placement to the mid LAD.  Recommendations: Dual antiplatelet therapy for at least 1 year. Blood pressure control and cardiac rehab.  Continue treatment of risk factors.   Echo performed on 06/06/2019  Impressions:   The left ventricle has moderately reduced systolic function, with an ejection fraction of 35- 40%. The cavity size was normal. There is moderately increased left ventricular wall thickness. Left ventricular diastolic Doppler parameters are indeterminate. Left ventricular diffuse hypokinesis. 2. The right ventricle has normal systolic function. The cavity was normal. There is no increase in right ventricular wall thickness.Unable to estimate RVSP 3. Frequent PVCs. 4. Left atrial size was moderately  dilated.  _____________________________________________________________________________________________ Education:   Patient sitting up in bed with HOB elevated talking with daughter at the bedside. Patient gave verbal permission for this patient to speak about her health / medical diagnosis in front of her daughter.  "Heart Attack Bouncing Back" booklet given and reviewed with patient. Discussed the definition of CAD. Reviewed the location of CAD and where his / her stent was placed. Informed patient she will be given a stent card. Explained the purpose of the stent card. Instructed patient to keep stent card in  her wallet. Discussed EF, normal value, and patients' value.  Instructed patient to weigh herself daily and contact Cardiologist if she gains 2 pounds overnight or 5 pounds in a week. Patient has functioning scale and plans to start weighing herself daily and recording her weight.   Also instructed patient to contact Cardiologist for increasing SOB, chest pain / discomfort, swelling of hands, abdomen, ankles/feet. ? Discussed modifiable risk factors including controlling blood pressure, cholesterol, and blood sugar; following heart healthy diet; maintaining healthy weight; exercise; and smoking cessation, if applicable.   ? Discussed cardiac medications including rationale for taking, mechanisms of action, and side effects. Stressed the importance of taking medications as prescribed. Patient discharging on the following medications:  Aspirin, Plavix, Lipitor, Coreg, Losartan, Hydrochlorothiazide,  ? Discussed emergency plan for heart attack symptoms. Patient verbalized understanding of need to call 911 and not to drive himself to ER if having cardiac symptoms / chest pain.   ? Diet of low sodium, low fat, low cholesterol heart healthy modified diet discussed. Information on diet provided.  Handouts given and reviewed:  Sodium Content of Food, Low Sodium Nutrition Therapy, Heart Healthy  Nutrition Therapy.    ? Smoking Cessation - Patient is a NEVER smoker.   ? Exercise - Benefits of exercised discussed. Patient described an active lifestyle where she just finished cleaning out all her kitchen cabinets last week.  Daughter stated patient is always busy doing something, but does not exercise per se.  Patient does not use or need any assistive devices when she ambulates.    Informed patient her cardiologist has referred her to outpatient Cardiac Rehab. An overview of the program was provided. Brochure, informational letter and CPT billing codes given to patient. Patient is interested in participating, but will need to work out her transportation issues.  Patient may not be able to come three times per week.    Patient and daughter appreciative of the information.  ? Roanna Epley, RN, BSN, [place]  [facility] Cardiac & Pulmonary Rehab  Cardiovascular & Pulmonary Nurse Navigator  Direct Line: [phone]  Department Phone #: [phone] Fax: [phone]  Email Address: [email or web address]

## 2019-06-08 NOTE — Plan of Care (Signed)

## 2019-06-08 NOTE — Telephone Encounter (Signed)
Pt c/o BP issue: STAT if pt c/o blurred vision, one-sided weakness or slurred speech  1. What are your last 5 BP readings?  112/48   HR 60   2. Are you having any other symptoms (ex. Dizziness, headache, blurred vision, passed out)? Nausea   3. What is your BP issue? Patient feels like when bp is lower she gets nauseated   Just got home from hospital and feeling like she did before going

## 2019-06-08 NOTE — Plan of Care (Signed)
  Problem: Clinical Measurements: Goal: Respiratory complications will improve Outcome: Progressing   Problem: Activity: Goal: Risk for activity intolerance will decrease Outcome: Progressing   

## 2019-06-08 NOTE — Progress Notes (Signed)
Patient: Stephanie Charles Female    DOB: Mar 03, 1930   85 y.o.   MRN: [id number] Visit Date: 06/12/2019  Today's Provider: [name], MD   No chief complaint on file.  Subjective:     HPI   Follow up Hospitalization  Patient was admitted to [facility] on 06/06/19 and discharged on 06/08/19. She was treated for chest pain,Unstable angina,Non-ST elevation (NSTEMI) myocardial infarction. She had drug eluding stend plance for 95% LAD lesion on 06/07/19. Treatment for this included labs,ekg,she received IV Lasix 40 mg times one,IV heparin drip,cardiac cath done 06/07/19,PRN hydralazine carvedilol dose increased to 12.5 mg p.o. twice daily and HCTZ. Discontinue Imdur and hydralazine per cardiology   She reports satisfactory compliance with treatment. She reports this condition is Improved.  Outpatient follow-up with Dr. Candis Musa in 1 to 2 weeks     Requests rf for Mometasone for eczema of EACs.  Allergies  Allergen Reactions  . Latex Itching  . Levofloxacin     Mouth sores  . Lisinopril     Cough?  . Povidone-Iodine Other (See Comments)    Severe blistering and itchiness. Severe redness  . Simvastatin     Myalgias  . Codeine Nausea And Vomiting     Current Outpatient Medications:  .  acetaminophen (TYLENOL) 325 MG tablet, Take 2 tablets (650 mg total) by mouth every 6 (six) hours as needed for mild pain or headache., Disp: , Rfl:  .  albuterol (PROVENTIL) (2.5 MG/3ML) 0.083% nebulizer solution, Take 3 mLs (2.5 mg total) by nebulization every 4 (four) hours as needed., Disp: 75 mL, Rfl: 3 .  allopurinol (ZYLOPRIM) 300 MG tablet, TAKE 1 TABLET BY MOUTH ONCE DAILY (Patient taking differently: Take 300 mg by mouth daily. ), Disp: 90 tablet, Rfl: 3 .  amitriptyline (ELAVIL) 10 MG tablet, TAKE 2 TABLETS BY MOUTH AT BEDTIME (Patient taking differently: Take 20 mg by mouth at bedtime. ), Disp: 60 tablet, Rfl: 5 .  aspirin EC 81 MG EC tablet, Take 1 tablet (81 mg total) by mouth  daily., Disp:  , Rfl:  .  atorvastatin (LIPITOR) 40 MG tablet, TAKE 1 TABLET BY MOUTH ONCE DAILY (Patient taking differently: Take 40 mg by mouth daily. ), Disp: 90 tablet, Rfl: 3 .  carvedilol (COREG) 12.5 MG tablet, Take 1 tablet (12.5 mg total) by mouth 2 (two) times daily., Disp: 60 tablet, Rfl: 0 .  clopidogrel (PLAVIX) 75 MG tablet, Take 1 tablet by mouth once daily (Patient taking differently: Take 75 mg by mouth daily. ), Disp: 90 tablet, Rfl: 3 .  fluticasone (FLONASE) 50 MCG/ACT nasal spray, USE 2 SPRAY(S) IN EACH NOSTRIL ONCE DAILY (Patient taking differently: Place 2 sprays into both nostrils daily. ), Disp: 16 g, Rfl: 11 .  hydrochlorothiazide (HYDRODIURIL) 25 MG tablet, Take 1 tablet by mouth once daily (Patient taking differently: Take 25 mg by mouth daily. ), Disp: 90 tablet, Rfl: 0 .  levothyroxine (SYNTHROID) 137 MCG tablet, Take 1 tablet by mouth once daily (Patient taking differently: Take 137 mcg by mouth daily. ), Disp: 90 tablet, Rfl: 3 .  losartan (COZAAR) 100 MG tablet, TAKE 1 TABLET BY MOUTH ONCE DAILY (Patient taking differently: Take 100 mg by mouth daily. ), Disp: 90 tablet, Rfl: 3 .  mirtazapine (REMERON) 30 MG tablet, TAKE 1 TABLET BY MOUTH AT BEDTIME (Patient taking differently: Take 30 mg by mouth at bedtime. ), Disp: 90 tablet, Rfl: 0 .  Multiple Vitamin (MULTIVITAMIN) capsule, Take  1 capsule by mouth daily., Disp: , Rfl:  .  pantoprazole (PROTONIX) 40 MG tablet, Take 1 tablet (40 mg total) by mouth daily., Disp: 30 tablet, Rfl: 5 .  sertraline (ZOLOFT) 25 MG tablet, Take 1 tablet by mouth once daily (Patient taking differently: Take 25 mg by mouth daily. ), Disp: 90 tablet, Rfl: 3  Review of Systems  Constitutional: Negative.   Eyes: Negative.   Respiratory: Positive for shortness of breath. Negative for apnea, cough, choking, chest tightness, wheezing and stridor.   Cardiovascular: Positive for leg swelling (Right worse than left.). Negative for chest pain and  palpitations.  Gastrointestinal: Positive for diarrhea (Secondary to taking iron). Negative for abdominal distention, abdominal pain, anal bleeding, blood in stool, constipation, nausea, rectal pain and vomiting.  Endocrine: Negative.   Musculoskeletal: Negative.   Allergic/Immunologic: Negative.   Neurological: Negative for dizziness, light-headedness and headaches.  Hematological: Negative.   Psychiatric/Behavioral: Negative.     Social History   Tobacco Use  . Smoking status: Never Smoker  . Smokeless tobacco: Never Used  Substance Use Topics  . Alcohol use: No    Alcohol/week: 0.0 standard drinks      Objective:   BP (!) 142/72   Pulse 70   Temp 98.5 F (36.9 C)   Resp 16   Wt 166 lb (75.3 kg)   SpO2 98%   BMI 30.36 kg/m  Vitals:   06/12/19 1010  BP: (!) 142/72  Pulse: 70  Resp: 16  Temp: 98.5 F (36.9 C)  SpO2: 98%  Weight: 166 lb (75.3 kg)  Body mass index is 30.36 kg/m.   Physical Exam Vitals signs reviewed.  Constitutional:      Appearance: Normal appearance.  HENT:     Right Ear: External ear normal.     Left Ear: External ear normal.     Nose: Nose normal.  Eyes:     General: No scleral icterus.    Conjunctiva/sclera: Conjunctivae normal.     Comments: conjuctiva less pale  Cardiovascular:     Rate and Rhythm: Normal rate and regular rhythm.     Heart sounds: Normal heart sounds.  Pulmonary:     Effort: Pulmonary effort is normal.     Breath sounds: Normal breath sounds.  Abdominal:     Palpations: Abdomen is soft.     Tenderness: There is no abdominal tenderness.  Lymphadenopathy:     Cervical: No cervical adenopathy.  Skin:    General: Skin is warm and dry.  Neurological:     General: No focal deficit present.     Mental Status: She is alert and oriented to person, place, and time.  Psychiatric:        Mood and Affect: Mood normal.        Behavior: Behavior normal.        Thought Content: Thought content normal.        Judgment:  Judgment normal.      No results found for any visits on 06/12/19.     Assessment & Plan    1. Non-ST elevation (NSTEMI) myocardial infarction [facility]) Doing well since discharge. Stable on new meds.  2. Benign essential HTN Controlled.,  3. Iron deficiency anemia due to chronic blood loss Stop iron ,check CBC.More than 50% 25 minute visit spent in counseling or coordination of care   4. Eczema, unspecified type EACs only. - mometasone (ELOCON) 0.1 % cream; Apply 1 application topically daily.  Dispense: 45 g; Refill: 0  Ammaar Encina Cranford Mon, MD  [facility]

## 2019-06-08 NOTE — Progress Notes (Signed)
Progress Note  Patient Name: Stephanie Charles Date of Encounter: 06/08/2019  Primary Cardiologist: Ida Rogue, MD   Subjective   Patient alert sitting up in bed no complaints this morning Ate breakfast, tolerated morning medications Daughter at the bedside Overall happy with procedure yesterday, recovered well No pain in right groin  Inpatient Medications    Scheduled Meds: . allopurinol  300 mg Oral Daily  . amitriptyline  20 mg Oral QHS  . aspirin EC  81 mg Oral Daily  . atorvastatin  40 mg Oral Daily  . carvedilol  12.5 mg Oral BID  . carvedilol  6.25 mg Oral Once  . clopidogrel  75 mg Oral Daily  . fluticasone  2 spray Each Nare Daily  . hydrochlorothiazide  25 mg Oral Daily  . levothyroxine  137 mcg Oral Daily  . losartan  100 mg Oral Daily  . mirtazapine  30 mg Oral QHS  . multivitamin with minerals  1 tablet Oral Daily  . pantoprazole  40 mg Oral Daily  . sertraline  25 mg Oral Daily  . sodium chloride flush  3 mL Intravenous Q12H   Continuous Infusions: . sodium chloride     PRN Meds: sodium chloride, acetaminophen, albuterol, nitroGLYCERIN, ondansetron (ZOFRAN) IV, sodium chloride flush   Vital Signs    Vitals:   06/07/19 1857 06/07/19 2007 06/08/19 0457 06/08/19 0804  BP: (!) 156/61 (!) 148/64 (!) 150/72 (!) 146/53  Pulse: 80 81 64 73  Resp: 14 18 20 19   Temp: (!) 97.5 F (36.4 C) 98.1 F (36.7 C) 98.2 F (36.8 C) 98.3 F (36.8 C)  TempSrc: Oral Oral Oral Oral  SpO2: 96% 97% 95% 93%  Weight:   73 kg   Height:        Intake/Output Summary (Last 24 hours) at 06/08/2019 1604 Last data filed at 06/08/2019 0457 Gross per 24 hour  Intake 206.67 ml  Output 250 ml  Net -43.33 ml   Last 3 Weights 06/08/2019 06/07/2019 06/07/2019  Weight (lbs) 161 lb 167 lb 5.3 oz 167 lb 5.3 oz  Weight (kg) 73.029 kg 75.9 kg 75.9 kg      Telemetry    Normal sinus rhythm- Personally Reviewed  ECG    - Personally Reviewed  Physical Exam   GEN: No acute  distress.   Neck: No JVD Cardiac: RRR, no murmurs, rubs, or gallops.  Respiratory: Clear to auscultation bilaterally. GI: Soft, nontender, non-distended  MS: No edema; No deformity. Neuro:  Nonfocal  Psych: Normal affect   Labs    High Sensitivity Troponin:   Recent Labs  Lab 06/06/19 0930 06/06/19 1126 06/06/19 1615 06/06/19 2243 06/07/19 0054  TROPONINIHS 30* 97* 296* 262* 215*      Chemistry Recent Labs  Lab 06/06/19 0930 06/07/19 0301 06/08/19 0511  NA 139 140 142  K 3.5 3.2* 3.8  CL 106 104 107  CO2 23 27 26   GLUCOSE 124* 129* 112*  BUN 20 25* 26*  CREATININE 1.03* 1.27* 1.33*  CALCIUM 8.9 8.7* 8.6*  GFRNONAA 49* 38* 36*  GFRAA 57* 44* 42*  ANIONGAP 10 9 9      Hematology Recent Labs  Lab 06/06/19 0930 06/07/19 0301 06/08/19 0511  WBC 7.4 10.6* 9.1  RBC 3.95 3.86* 3.77*  HGB 12.2 12.0 11.7*  HCT 36.5 36.3 35.4*  MCV 92.4 94.0 93.9  MCH 30.9 31.1 31.0  MCHC 33.4 33.1 33.1  RDW 14.5 14.7 14.6  PLT 150 154 157  BNP Recent Labs  Lab 06/06/19 0930  BNP 246.0*     DDimer No results for input(s): DDIMER in the last 168 hours.   Radiology    No results found.  Cardiac Studies  Procedural Findings:  RA pressures mean of 8 RV pressure 32/7 mean 9 PA pressure 31/16 mean 21 Wedge pressure 10 Cardiac output 3.57 Cardiac index 2.02  Final Conclusions:   Culprit vessel for presentation is the proximal to mid LAD estimated at 90%  stent placed to proximal/mid LAD  Patient Profile     85 y.o. female with history of NSTEMI in 2016 felt to possibly be in the setting of demand ischemia with hypertensive urgency, LBBB, TIA/CVA in 2016, CKD stage II, HTN, HLD, carotid artery disease, hypothyroidism, diverticulitis, anemia, anxiety, depression, and gout admitted with chest pain and accelerated HTN found to have elevated high sensitivity troponin peaking at 0000000 and acute systolic CHF.   Assessment & Plan    A/P: NSTEMI Echocardiogram with  depressed ejection fraction 35% left bundle branch block Elevated cardiac enzymes consistent with non-STEMI  cardiac catheterization, with severe proximal to mid LAD disease, stent placed Continue aspirin Plavix high intensity statin beta-blocker ARB  Cardiomyopathy  hypertensive heart disease, ischemic cardiomyopathy Labile pressures contributing Continue carvedilol 12.5 twice daily Continue losartan 100 daily HCTZ 25 tomorrow  Labile hypertension Other issues seem to include chronic nausea, general malaise, weakness spells  Chronic kidney disease stage II Elderly female, chronic hypertension Blood pressure control  Long discussion with patient and daughter, discharge instructions provided, education following catheterization, discussed blood pressure control  Total encounter time more than 35 minutes  Greater than 50% was spent in counseling and coordination of care with the patient  For questions or updates, please contact [facility] Please consult [email or web address] for contact info under        Signed, Ida Rogue, MD  06/08/2019, 4:04 PM

## 2019-06-12 ENCOUNTER — Other Ambulatory Visit: Payer: Self-pay

## 2019-06-12 ENCOUNTER — Encounter: Payer: Self-pay | Admitting: Family Medicine

## 2019-06-12 ENCOUNTER — Ambulatory Visit (INDEPENDENT_AMBULATORY_CARE_PROVIDER_SITE_OTHER): Payer: Medicare Other | Admitting: Family Medicine

## 2019-06-12 VITALS — BP 142/72 | HR 70 | Temp 98.5°F | Resp 16 | Wt 166.0 lb

## 2019-06-12 DIAGNOSIS — I214 Non-ST elevation (NSTEMI) myocardial infarction: Secondary | ICD-10-CM | POA: Diagnosis not present

## 2019-06-12 DIAGNOSIS — L309 Dermatitis, unspecified: Secondary | ICD-10-CM | POA: Diagnosis not present

## 2019-06-12 DIAGNOSIS — I10 Essential (primary) hypertension: Secondary | ICD-10-CM | POA: Diagnosis not present

## 2019-06-12 DIAGNOSIS — D500 Iron deficiency anemia secondary to blood loss (chronic): Secondary | ICD-10-CM

## 2019-06-12 MED ORDER — MOMETASONE FUROATE 0.1 % EX CREA: 1.0000 "application " | TOPICAL_CREAM | Freq: Every day | CUTANEOUS | 0 refills | Status: DC

## 2019-06-12 NOTE — Patient Instructions (Signed)
Stop Iron supplement.

## 2019-06-20 NOTE — Progress Notes (Signed)
Office Visit    Patient Name: Stephanie Charles Date of Encounter: 06/21/2019  Primary Care Provider:  Jerrol Banana., MD Primary Cardiologist:  Ida Rogue, MD  Chief Complaint    85 y.o. female with a hx of CAD s/p 06/2019 [facility] with PCI, HFrEF (35-40%), ICM and hypertensive heart dz with labile pressures, known LBBB, 1st degree AVB, HTN, h/o NSTEMI (2016, due to supply demand in setting of hypertensive urgency), HLD/ familial hypercholesterolemia, hypothyroid / thyroid disease, palpitations, TIA/ posterior CVA 2016), bilateral mild carotid disease, bilateral dependent ankle edema, positive FOBT [id number], acute uncomplicated sigmoid diverticulitis with further workup declined by patient, normocytic anemia, gout, anxiety/depression and GERD who is being seen today for hospital follow-up as detailed below.  Past Medical History    Past Medical History:  Diagnosis Date   Anemia    Diverticulitis    GERD (gastroesophageal reflux disease)    Hyperlipidemia    Hypertension    Hypothyroidism    Myocardial infarction ([place]) 08/2017   TIA (transient ischemic attack)    1 approx 2015, 1 approx 2018   Vertigo    last episode several months ago   [name]    Past Surgical History:  Procedure Laterality Date   COLONOSCOPY WITH PROPOFOL N/A 06/22/2018   Procedure: COLONOSCOPY WITH PROPOFOL;  Surgeon: Jonathon Bellows, MD;  Location: [facility] ENDOSCOPY;  Service: Endoscopy;  Laterality: N/A;   CORONARY STENT INTERVENTION N/A 06/07/2019   Procedure: CORONARY STENT INTERVENTION;  Surgeon: [name], MD;  Location: [place] CV LAB;  Service: Cardiovascular;  Laterality: N/A;   ESOPHAGOGASTRODUODENOSCOPY (EGD) WITH PROPOFOL N/A 06/22/2018   Procedure: ESOPHAGOGASTRODUODENOSCOPY (EGD) WITH PROPOFOL;  Surgeon: Jonathon Bellows, MD;  Location: [facility] ENDOSCOPY;  Service: Endoscopy;  Laterality: N/A;   KNEE ARTHROSCOPY Right    REPLACEMENT TOTAL KNEE BILATERAL      RIGHT/LEFT HEART CATH AND CORONARY ANGIOGRAPHY N/A 06/07/2019   Procedure: RIGHT/LEFT HEART CATH AND CORONARY ANGIOGRAPHY;  Surgeon: [name], MD;  Location: [place] CV LAB;  Service: Cardiovascular;  Laterality: N/A;   SHOULDER SURGERY Left     Allergies  Allergies  Allergen Reactions   Latex Itching   Levofloxacin     Mouth sores   Lisinopril     Cough?   Povidone-Iodine Other (See Comments)    Severe blistering and itchiness. Severe redness   Simvastatin     Myalgias   Codeine Nausea And Vomiting    History of Present Illness    85 yo female with PMH as above and no current alcohol or drug use but does report a long history of exposure to second hand smoke. She lives in a condo with assistance from her daughters.   She has PTA Plavix per recommendation of [facility] neurology given her history of TIA/CVA in 2016.  Per review of EMR, she is followed by [facility] for mild and asx bilateral carotid stenosis (<40%. 2017) with clinically insignificant plaque in the external carotids. She has a history of 2 TIAs in 2016 with duplex at that time and repeat duplex with notes stating TIAs are unrelated to the carotid disease. Also in 2016, she had elevated Tn 2/2 supply demand ischemia in the setting of elevated BP. She underwent 2016 stress test showing a fixed defect that improved with stress, normal EF, and was ruled low risk.2017 Holter monitor, performed for PVCs, showed SR with occasional PVCs and PACs, occasional nonsustained SVT and WCT. No sustained supraventricular  arrhythmias. She underwent stress test 12/07/2016 that was without evidence of ischemia and showed normal LV function. Echo 12/07/2016 showed normal LVSF and EF 55% with mild MR and mild TR.   On 06/06/2019, she presented to [facility] ED with c/o racing HR, SBP into the 200s and DBP into the 100s, and substernal CP lasting about 1.5h until receipt of SL nitro. She also reported clamminess and palpitations, as well as  dependent edema. She reported melena as well (in the setting of previously declining further GI workup as above). Echo showed EF 35-40% and LV diffuse hypokinesis.  High-sensitivity troponin peak of 296.  On 9/3, she underwent R/LHC that showed mLAD 95% stenosed with PCI/DES and 0% residual stenosis. 2nd diag lesion 30% stenosed. Recommendations were for DAPT for at least 1 year, as well as BP control and cardiac rehab.  Since that time, the patient has noted extensive ecchymosis, extending from her medial right thigh to behind her knee with associated tenderness. She stated that this ecchymosis started immediately after her cardiac catheterization and has not worsened or improved since that time; however, the associated pain and tenderness increased significantly last night (9/16) and after she did a lower body workout with her exercise bands. She stated the pain was so significant that she was not able to sleep through the night. She has not had any further episodes of substernal chest pain since her catheterization. She has noted an increase in energy. No SOB or DOE. No reported feelings of rapid HR or palpitations. No near syncope or syncope. She did note that she feels her vertigo is "acting up," as she will have brief (lasting only seconds) episodes of feeling like she is in "looney land" when going from the seated position to lying down but not the other way around. She stated this is consistent with her vertigo in the past rather than orthostasis. She reported that her BP at home has improved from systolic in the 123456 and diastolic in the 123XX123 to systolic in the 0000000. She has been eating well but did admit that she is not hydrating but will make an effort to do so, as she realizes this is not ideal given her CKD and as she is on a diuretic. She denied any worsening heart failure symptoms of orthopnea, PND, early satiety, LEE, or abdominal distention. In fact, she noted that her previously reported  dependent edema had not returned since her cardiac catheterization. She reported compliance with her medications. No BRBPR, hemoptysis, hematochezia, or hematuria. She did report melena, which she stated is ongoing and due to her needing iron supplementation for her anemia. She stated that she had gone on some walks with her daughter and was using her exercise bands but did not wish to do cardiac rehab three times a week with recommendations to complete rehab as below.   Recent labs 06/08/2019: Cr 1.33, BUN 26, K 3.8 9/4 CBC: Hgb 11.7, RBC 3.77 9/2 LDL 45, TSH 0.432 10/2011 A1C 5.5  Home Medications    Prior to Admission medications   Medication Sig Start Date End Date Taking? Authorizing Provider  acetaminophen (TYLENOL) 325 MG tablet Take 2 tablets (650 mg total) by mouth every 6 (six) hours as needed for mild pain or headache. 06/08/19   Gouru, Illene Silver, MD  albuterol (PROVENTIL) (2.5 MG/3ML) 0.083% nebulizer solution Take 3 mLs (2.5 mg total) by nebulization every 4 (four) hours as needed. 05/16/19 05/15/20  Jerrol Banana., MD  allopurinol (ZYLOPRIM) 300 MG tablet  TAKE 1 TABLET BY MOUTH ONCE DAILY Patient taking differently: Take 300 mg by mouth daily.  08/21/18   Jerrol Banana., MD  amitriptyline (ELAVIL) 10 MG tablet TAKE 2 TABLETS BY MOUTH AT BEDTIME Patient taking differently: Take 20 mg by mouth at bedtime.  06/01/19   Jerrol Banana., MD  aspirin EC 81 MG EC tablet Take 1 tablet (81 mg total) by mouth daily. 06/09/19   Gouru, Illene Silver, MD  atorvastatin (LIPITOR) 40 MG tablet TAKE 1 TABLET BY MOUTH ONCE DAILY Patient taking differently: Take 40 mg by mouth daily.  08/21/18   Jerrol Banana., MD  carvedilol (COREG) 12.5 MG tablet Take 1 tablet (12.5 mg total) by mouth 2 (two) times daily. 06/08/19   Nicholes Mango, MD  clopidogrel (PLAVIX) 75 MG tablet Take 1 tablet by mouth once daily Patient taking differently: Take 75 mg by mouth daily.  05/09/19   Jerrol Banana., MD   fluticasone (FLONASE) 50 MCG/ACT nasal spray USE 2 SPRAY(S) IN EACH NOSTRIL ONCE DAILY Patient taking differently: Place 2 sprays into both nostrils daily.  03/29/18   Jerrol Banana., MD  hydrochlorothiazide (HYDRODIURIL) 25 MG tablet Take 1 tablet by mouth once daily Patient taking differently: Take 25 mg by mouth daily.  04/18/19   Jerrol Banana., MD  levothyroxine (SYNTHROID) 137 MCG tablet Take 1 tablet by mouth once daily Patient taking differently: Take 137 mcg by mouth daily.  05/16/19   Jerrol Banana., MD  losartan (COZAAR) 100 MG tablet TAKE 1 TABLET BY MOUTH ONCE DAILY Patient taking differently: Take 100 mg by mouth daily.  08/07/18   Jerrol Banana., MD  mirtazapine (REMERON) 30 MG tablet TAKE 1 TABLET BY MOUTH AT BEDTIME Patient taking differently: Take 30 mg by mouth at bedtime.  05/16/19   Jerrol Banana., MD  mometasone (ELOCON) 0.1 % cream Apply 1 application topically daily. 06/12/19   Jerrol Banana., MD  Multiple Vitamin (MULTIVITAMIN) capsule Take 1 capsule by mouth daily.    [provider]  pantoprazole (PROTONIX) 40 MG tablet Take 1 tablet (40 mg total) by mouth daily. 01/18/19   Jerrol Banana., MD  sertraline (ZOLOFT) 25 MG tablet Take 1 tablet by mouth once daily Patient taking differently: Take 25 mg by mouth daily.  03/08/19   Jerrol Banana., MD    Review of Systems    She denies chest pain, palpitations, dyspnea, pnd, orthopnea, n, v, dizziness, syncope, edema, weight gain, or early satiety. She reported large right medial thigh hematoma, TTP and with worsening pain since using her exercise bands last night (9/16).  All other systems reviewed and are otherwise negative except as noted above.  Physical Exam    VS:  BP 132/68 (BP Location: Left Arm, Patient Position: Sitting, Cuff Size: Normal)    Pulse 65    Temp (!) 97.2 F (36.2 C)    Ht 5\' 2"  (1.575 m)    Wt 165 lb (74.8 kg)    BMI 30.18 kg/m  , BMI  Body mass index is 30.18 kg/m. GEN: Elderly female, in no acute distress, joined by her daughter. HEENT: normal. Neck: Supple, no JVD, carotid bruits, or masses. Cardiac: RRR, no murmurs, rubs, or gallops. No clubbing, cyanosis, edema.  Radials/DP/PT 2+ and equal bilaterally.  Respiratory:  Respirations regular and unlabored, clear to auscultation bilaterally. GI: Soft, nontender, nondistended, BS + x 4. MS: large  hematoma extending along the medial right thigh and tender to palpation with associated right femoral cath site bruit. Skin: warm and dry, no rash. Neuro:  Strength and sensation are intact. Psych: Normal affect.  Accessory Clinical Findings    ECG personally reviewed by me today - SR with 1st degree AVB and known LBBB, 65bpm, resolution of TWI seen in previous EKG in precordial leads- no acute changes.   [facility] 9/3  Mid LAD lesion is 95% stenosed.  Post intervention, there is a 0% residual stenosis.  A drug-eluting stent was successfully placed using a STENT RESOLUTE ONYX 2.5X22.  2nd Diag lesion is 30% stenosed.  Successful angioplasty and drug-eluting stent placement to the mid LAD. RA pressures mean of 8 RV pressure 32/7 mean 9 PA pressure 31/16 mean 21 Wedge pressure 10 Cardiac output 3.57 Cardiac index 2.02 Recommendations: Dual antiplatelet therapy for at least 1 year. Blood pressure control and cardiac rehab.  Continue treatment of risk factors.  Recent labs 06/08/2019: Cr 1.33, BUN 26, K 3.8 9/4 CBC: Hgb 11.7, RBC 3.77 9/2 LDL 45, TSH 0.432 10/2011 A1C 5.5  Assessment & Plan    CAD s/p NSTEMI --No chest pain since her cardiac catheterization. She was not discharged with SL nitro, and we will plan to send her home with SL nitro today with instructions regarding its use reviewed during today's visit.  --Reported compliance with medications, including DAPT. She noted significant and painful ecchymosis along her right medial thigh after cardiac  catheterization with exam notable for a R femoral cath site bruit today. Recommendation was for STAT ultrasound due to concern for pseudoaneurysm.  --Cath films were reviewed with Dr. Saunders Revel. --Labs: CBC, BMET, Mg.  --No medication changes today and pending ultrasound and labs as above. Continue DAPT. Will tenatively plan to transition from ARB to [facility] if renal function and potassium allow, given she has plenty of room in her BP per office and home BP readings. Continue BB. Prescribed PRN SL nitro as needed for CP with instructions regarding use reviewed. Statin for aggressive risk factor modification. Reviewed the benefits of this cardiac rehab with patient agreeable to make an effort to attend now.  HFrEF (35-40%) --No symptoms of worsening heart failure. Weight stable. Continue medical management with BB and ARB. Pending BMET, and if stable renal function and electrolytes, will plan to transition from ARB to [facility]. Spironolactone to be added at a later date. Will schedule follow-up echo for 3 months. Obtain labs as above.  HTN --BP still suboptimal yet improved from SBP in the 200s with SBP in the 130s today. Continue Coreg. Pending BMET, continue HCTZ. Will plan to transition from ARB to [facility] pending BMET.   HLD --Continue statin therapy with goal LDL <70.  Anemia --Previous Hgb 11.7 and stable. Obtain CBC on DAPT. Continue PPI. Obtaining ultrasound of lower extremity hematoma as above.  CKDII --Obtain BMET, mg. Most recent Cr 1.33 with baseline 1.03 and report of reduced hydration.   Disposition: Obtain BMET, Mg, CBC. Tentative plan to transition ARB to [facility] pending labs. Spironolactone to be added at a later date. Prescribed SL nitro on PRN basis. Obtain ultrasound. Echo in 3 months. Follow-up in 3 months.   Arvil Chaco, PA-C 06/21/2019, 6:08 PM

## 2019-06-21 ENCOUNTER — Other Ambulatory Visit: Payer: Self-pay

## 2019-06-21 ENCOUNTER — Other Ambulatory Visit: Payer: Self-pay | Admitting: Physician Assistant

## 2019-06-21 ENCOUNTER — Ambulatory Visit
Admission: RE | Admit: 2019-06-21 | Discharge: 2019-06-21 | Disposition: A | Discharge status: Home / Self Care | Payer: Medicare Other | Source: Ambulatory Visit | Attending: Physician Assistant | Admitting: Physician Assistant

## 2019-06-21 ENCOUNTER — Ambulatory Visit (INDEPENDENT_AMBULATORY_CARE_PROVIDER_SITE_OTHER): Payer: Medicare Other | Admitting: Physician Assistant

## 2019-06-21 ENCOUNTER — Encounter: Payer: Self-pay | Admitting: Physician Assistant

## 2019-06-21 ENCOUNTER — Telehealth: Payer: Self-pay

## 2019-06-21 VITALS — BP 132/68 | HR 65 | Temp 97.2°F | Ht 62.0 in | Wt 165.0 lb

## 2019-06-21 DIAGNOSIS — R0989 Other specified symptoms and signs involving the circulatory and respiratory systems: Secondary | ICD-10-CM

## 2019-06-21 DIAGNOSIS — I2510 Atherosclerotic heart disease of native coronary artery without angina pectoris: Secondary | ICD-10-CM | POA: Diagnosis not present

## 2019-06-21 DIAGNOSIS — I130 Hypertensive heart and chronic kidney disease with heart failure and stage 1 through stage 4 chronic kidney disease, or unspecified chronic kidney disease: Secondary | ICD-10-CM

## 2019-06-21 DIAGNOSIS — I729 Aneurysm of unspecified site: Secondary | ICD-10-CM

## 2019-06-21 DIAGNOSIS — E785 Hyperlipidemia, unspecified: Secondary | ICD-10-CM

## 2019-06-21 DIAGNOSIS — Z9889 Other specified postprocedural states: Secondary | ICD-10-CM

## 2019-06-21 DIAGNOSIS — I724 Aneurysm of artery of lower extremity: Secondary | ICD-10-CM | POA: Diagnosis not present

## 2019-06-21 DIAGNOSIS — I10 Essential (primary) hypertension: Secondary | ICD-10-CM

## 2019-06-21 DIAGNOSIS — I5022 Chronic systolic (congestive) heart failure: Secondary | ICD-10-CM

## 2019-06-21 DIAGNOSIS — T81718A Complication of other artery following a procedure, not elsewhere classified, initial encounter: Secondary | ICD-10-CM | POA: Diagnosis not present

## 2019-06-21 DIAGNOSIS — S8011XA Contusion of right lower leg, initial encounter: Secondary | ICD-10-CM

## 2019-06-21 DIAGNOSIS — D509 Iron deficiency anemia, unspecified: Secondary | ICD-10-CM

## 2019-06-21 NOTE — Telephone Encounter (Signed)
Attempted to call patient's daughter Jessy Oto at [phone] as well as the patient with no answer at either number.  I did leave a message for them to contact our office.  I see that nursing staff has been in touch with him to notify them the ultrasound showed no pseudoaneurysm with a likely hematoma noted.  Recommend warm compresses to the hematoma.  This will take several weeks to fully resolve.  If medications are needed for pain, we will discuss that with Marrianne Mood, PA-C who saw the patient today.

## 2019-06-21 NOTE — Telephone Encounter (Signed)
[facility] results received from [facility]. Stephanie Charles's LE arterial duplex is negative for pseudoaneurysm. There is collection of fluid at the cath entry site measuring 3.6cm, lisited as probably a hematoma.  The Stephanie Charles's daughter did not wait for our office to be called. The Stephanie Charles had a long day and wanted to be taken home. Update fwd to ordering provider Marrianne Mood, PA

## 2019-06-21 NOTE — Patient Instructions (Addendum)
Medication Instructions:  Your physician recommends that you continue on your current medications as directed. Please refer to the Current Medication list given to you today.   If you need a refill on your cardiac medications before your next appointment, please call your pharmacy.   Lab work: Art gallery manager, Psychologist, occupational, Mag today  If you have labs (blood work) drawn today and your tests are completely normal, you will receive your results only by: Marland Kitchen MyChart Message (if you have MyChart) OR . A paper copy in the mail If you have any lab test that is abnormal or we need to change your treatment, we will call you to review the results.  Testing/Procedures: Your physician has requested that you have an echocardiogram. Echocardiography is a painless test that uses sound waves to create images of your heart. It provides your doctor with information about the size and shape of your heart and how well your heart's chambers and valves are working. This procedure takes approximately one hour. There are no restrictions for this procedure. (To be scheduled in 3 months)   Follow-Up: At [facility], you and your health needs are our priority.  As part of our continuing mission to provide you with exceptional heart care, we have created designated Provider Care Teams.  These Care Teams include your primary Cardiologist (physician) and Advanced Practice Providers (APPs -  Physician Assistants and Nurse Practitioners) who all work together to provide you with the care you need, when you need it. You will need a follow up appointment in 4 weeks.  You may see Ida Rogue, MD or one of the following Advanced Practice Providers on your designated Care Team:   Murray Hodgkins, NP Christell Faith, PA-C . Marrianne Mood, PA-C  Any Other Special Instructions Will Be Listed Below (If Applicable).  You have a stat LE arterial duplex scheduled today @ 2:30pm at the [facility] center in [place]. Please arrive 15 minutes  prior to your appointment. Their address: [address].

## 2019-06-22 LAB — BASIC METABOLIC PANEL
BUN/Creatinine Ratio: 21 (ref 12–28)
BUN: 26 mg/dL (ref 8–27)
CO2: 24 mmol/L (ref 20–29)
Calcium: 9 mg/dL (ref 8.7–10.3)
Chloride: 104 mmol/L (ref 96–106)
Creatinine, Ser: 1.24 mg/dL — ABNORMAL HIGH (ref 0.57–1.00)
GFR calc Af Amer: 45 mL/min/{1.73_m2} — ABNORMAL LOW (ref >59)
GFR calc non Af Amer: 39 mL/min/{1.73_m2} — ABNORMAL LOW (ref >59)
Glucose: 105 mg/dL — ABNORMAL HIGH (ref 65–99)
Potassium: 4.1 mmol/L (ref 3.5–5.2)
Sodium: 142 mmol/L (ref 134–144)

## 2019-06-22 LAB — CBC WITH DIFFERENTIAL/PLATELET
Basophils Absolute: 0.1 10*3/uL (ref 0.0–0.2)
Basos: 1 %
EOS (ABSOLUTE): 0.3 10*3/uL (ref 0.0–0.4)
Eos: 3 %
Hematocrit: 32.8 % — ABNORMAL LOW (ref 34.0–46.6)
Hemoglobin: 10.3 g/dL — ABNORMAL LOW (ref 11.1–15.9)
Immature Grans (Abs): 0.1 10*3/uL (ref 0.0–0.1)
Immature Granulocytes: 1 %
Lymphocytes Absolute: 1.8 10*3/uL (ref 0.7–3.1)
Lymphs: 17 %
MCH: 29.7 pg (ref 26.6–33.0)
MCHC: 31.4 g/dL — ABNORMAL LOW (ref 31.5–35.7)
MCV: 95 fL (ref 79–97)
Monocytes Absolute: 0.6 10*3/uL (ref 0.1–0.9)
Monocytes: 5 %
Neutrophils Absolute: 7.6 10*3/uL — ABNORMAL HIGH (ref 1.4–7.0)
Neutrophils: 73 %
Platelets: 204 10*3/uL (ref 150–450)
RBC: 3.47 x10E6/uL — ABNORMAL LOW (ref 3.77–5.28)
RDW: 14.2 % (ref 11.7–15.4)
WBC: 10.4 10*3/uL (ref 3.4–10.8)

## 2019-06-22 LAB — MAGNESIUM: Magnesium: 1.8 mg/dL (ref 1.6–2.3)

## 2019-06-22 NOTE — Telephone Encounter (Signed)
Call placed to Stephanie Charles this evening at approximately 5:45 PM. Informed her that the findings of her right lower extremity duplex scan were negative for pseudoaneurysm. There was a small fluid collection, measuring 3.6 cm, that likely represented hematoma. Recommendation was for a warm compress. She was informed that it would likely take several weeks to resolve. Let her know to call if she continues to have pain or an increase in pain. Patient indicated her understanding and was appreciative of the call.  Of note, we also discussed her labs and upcoming medication changes; however, I informed her that she would receive an official call from our office on Monday 9/21 to go over those again with her and at the time that we called in her medications to her pharmacy of choice.

## 2019-06-25 ENCOUNTER — Telehealth: Payer: Self-pay | Admitting: Physician Assistant

## 2019-06-25 DIAGNOSIS — I10 Essential (primary) hypertension: Secondary | ICD-10-CM

## 2019-06-25 MED ORDER — NITROGLYCERIN 0.4 MG SL SUBL: 0.4000 mg | SUBLINGUAL_TABLET | SUBLINGUAL | 3 refills | Status: DC | PRN

## 2019-06-25 MED ORDER — ENTRESTO 49-51 MG PO TABS: 1.0000 | ORAL_TABLET | Freq: Two times a day (BID) | ORAL | 5 refills | Status: DC

## 2019-06-25 NOTE — Telephone Encounter (Signed)
Patient made aware of Jacquelyn's response with verbalized understanding. Rx for Nitro sent to the patient's pharmacy.  Patients sts that there has been slow improvement of the hematoma. It is a bit smaller and the bruised areas are improving. She has continued to do the warm compresses as instructed.  Patient voiced appreciation for the call back

## 2019-06-25 NOTE — Telephone Encounter (Signed)
Yes, she should be OK from a cardiac standpoint to travel by airplane to [facility] as long as her hematoma is showing signs of improvement. I discussed this travel with her primary cardiologist, Dr. Rockey Situ.  Is her hematoma improving? I will include Dr. Rockey Situ on this message as well.  Yes, please ensure she has PRN SL nitro. Thank you!

## 2019-06-25 NOTE — Telephone Encounter (Addendum)
Reviewed Marrianne Mood, PA medication instructions with the patient.  (1) Start Entresto 49mg /51mg  by mouth twice daily (We will increase the dose in about a month / at the time of her follow-up in a month).  (2) Stop Losartan 100mg  once daily. She should not take both this and the Entresto at the same time.  (3) Labs: BMET & CBC in 1 week.  (4) Trial of HCTZ on a PRN basis.   Patient verbalized understanding.  The patient would like to know if it would be ok fo her to travel by airplane to [place]. Patient's flight is scheduled for 06/30/19. She will return on 07/07/19. Labwork would need to be completed after that date.  Patient would also like a Rx for Nitro-glycerin. Adv the patient that I will fwd the message to [facility] and Dr. Rockey Situ and we will call back with a response.

## 2019-06-25 NOTE — Telephone Encounter (Signed)
Please call to discuss medications. Patient is not sure what she should be taking

## 2019-06-29 NOTE — Telephone Encounter (Signed)
Call to patient to make her aware that Lorenso Quarry, PA gave okay for patient to fly. Detailed message left with information and encouraged her to call back with any further questions or concerns.

## 2019-07-02 ENCOUNTER — Telehealth: Payer: Self-pay | Admitting: Physician Assistant

## 2019-07-02 ENCOUNTER — Other Ambulatory Visit: Payer: Self-pay

## 2019-07-02 ENCOUNTER — Other Ambulatory Visit
Admission: RE | Admit: 2019-07-02 | Discharge: 2019-07-02 | Disposition: A | Discharge status: Home / Self Care | Payer: Medicare Other | Source: Ambulatory Visit | Attending: Physician Assistant | Admitting: Physician Assistant

## 2019-07-02 DIAGNOSIS — I10 Essential (primary) hypertension: Secondary | ICD-10-CM | POA: Insufficient documentation

## 2019-07-02 LAB — CBC WITH DIFFERENTIAL/PLATELET
Abs Immature Granulocytes: 0.03 10*3/uL (ref 0.00–0.07)
Basophils Absolute: 0 10*3/uL (ref 0.0–0.1)
Basophils Relative: 0 %
Eosinophils Absolute: 0.3 10*3/uL (ref 0.0–0.5)
Eosinophils Relative: 4 %
HCT: 37 % (ref 36.0–46.0)
Hemoglobin: 12 g/dL (ref 12.0–15.0)
Immature Granulocytes: 0 %
Lymphocytes Relative: 18 %
Lymphs Abs: 1.7 10*3/uL (ref 0.7–4.0)
MCH: 31 pg (ref 26.0–34.0)
MCHC: 32.4 g/dL (ref 30.0–36.0)
MCV: 95.6 fL (ref 80.0–100.0)
Monocytes Absolute: 0.4 10*3/uL (ref 0.1–1.0)
Monocytes Relative: 5 %
Neutro Abs: 6.6 10*3/uL (ref 1.7–7.7)
Neutrophils Relative %: 73 %
Platelets: 159 10*3/uL (ref 150–400)
RBC: 3.87 MIL/uL (ref 3.87–5.11)
RDW: 14.8 % (ref 11.5–15.5)
WBC: 9.1 10*3/uL (ref 4.0–10.5)
nRBC: 0 % (ref 0.0–0.2)

## 2019-07-02 LAB — BASIC METABOLIC PANEL
Anion gap: 11 (ref 5–15)
BUN: 21 mg/dL (ref 8–23)
CO2: 26 mmol/L (ref 22–32)
Calcium: 9.1 mg/dL (ref 8.9–10.3)
Chloride: 105 mmol/L (ref 98–111)
Creatinine, Ser: 1.36 mg/dL — ABNORMAL HIGH (ref 0.44–1.00)
GFR calc Af Amer: 41 mL/min — ABNORMAL LOW (ref >60)
GFR calc non Af Amer: 35 mL/min — ABNORMAL LOW (ref >60)
Glucose, Bld: 127 mg/dL — ABNORMAL HIGH (ref 70–99)
Potassium: 4 mmol/L (ref 3.5–5.1)
Sodium: 142 mmol/L (ref 135–145)

## 2019-07-02 NOTE — Addendum Note (Signed)
Addended by: Santiago Bur on: 07/02/2019 10:26 AM   Modules accepted: Orders

## 2019-07-02 NOTE — Telephone Encounter (Signed)
Patient dropped off [employer] patient assistance application to be completed Placed in nurse box

## 2019-07-03 ENCOUNTER — Ambulatory Visit: Payer: Medicare Other | Admitting: Gastroenterology

## 2019-07-10 NOTE — Telephone Encounter (Signed)
Form filled out and placed on Stephanie Charles, [place] desk for signature.

## 2019-07-11 ENCOUNTER — Telehealth: Payer: Self-pay | Admitting: Family Medicine

## 2019-07-11 MED ORDER — CARVEDILOL 12.5 MG PO TABS: 12.5000 mg | ORAL_TABLET | Freq: Two times a day (BID) | ORAL | 5 refills | Status: DC

## 2019-07-11 NOTE — Progress Notes (Signed)
Cardiology Office Note    Date:  07/23/2019   ID:  Stephanie Charles, DOB [birth year], MRN [id number]  PCP:  Jerrol Banana., MD  Cardiologist:  Ida Rogue, MD  Electrophysiologist:  None   Chief Complaint: Follow up  History of Present Illness:   Stephanie Charles is a 85 y.o. female with history of CAD with prior NSTEMI in 2016 as detailed below with NSTEMI in 06/2019 s/p PCI/DES to the LAD, HFrEF secondary to ICM, hypertensive heart disease with labile BP, TIA/posterior CVA, known LBBB, mild bilateral carotid artery disease, hypothyroidism, positive FOBT in 04/2018, diverticulitis, normocytic anemia, gout, anxiety, depression, and GERD who presents for follow up of her CAD and ICM.    She was previously followed by Dr. Ubaldo Glassing, though transitioned to [facility] during her 06/2019 admission. Prior Holter from 2017 showed NSR with occasional OVCs and PACs with NSVT/WCT without sustained arrhythmias. Stress testing in 12/2016 was negative for significant ischemia with normal LVSF. Echo in 12/2016 showed normal LVSF with an EF of 55% and mild MR/TR.   More recently, she was admitted [facility] in 06/2019 with chest pain and palpitations in the setting of poorly controlled BP with systolic in the 123456 mmHg. Echo showed an EF of 35-40%, diffuse HK, normal RVSF and cavity size, moderately dilated left atrium. During the study, frequent PVCs were noted. High-sensitivity troponin peaked at 296. She underwent R/LHC that showed mid LAD 95% stenosis s/p successful PCI/DES, D2 30% stenosis. RHC showed mean RA pressure of 8, RV pressure 32/7 with a mean of 9, PA pressure 31/16 with a mean of 21, PCWP 10, CO/CI 3.57/2.02.   In hospital follow up on 06/21/2019, she was doing reasonably well, though did note extensive ecchymosis and discomfort at the right femoral cardiac cath site. On exam, a femoral bruit was noted. She underwent stat ultrasound which was negative for pseudoaneurysm with a small fluid collection  measuring 3.6 cm being noted, likely representing a resolving hematoma or seroma. Given her cardiomyopathy, she has been transitioned to [place] since her discharge. She has been continued on DAPT with ASA and Plavix, along with Lipitor, Coreg, and HCTZ prn.   Patient comes in accompanied by daughter today.  She is doing very well from a cardiac perspective.  She denies any chest pain, shortness of breath, palpitations, dizziness, presyncope, or syncope.  No lower extremity swelling, abdominal distention, orthopnea, PND, early satiety.  She is tolerating all medications without issues.  She has not needed any as needed HCTZ.  Blood pressure at home typically runs in the 130s over 60s.  Weight has remained stable.  No falls, BRBPR, or melena.  She has not missed any doses of dual antiplatelet therapy.  She feels like her energy has significantly improved.  Ecchymosis from prior cardiac cath site has resolved.  She does not have any issues or concerns at this time.   Labs: 06/2019 - HGB 12.0 (improved from 10.3), PLT 159, K+ 4.0, SCr 1.36, Mg++ 1.8, TC 100, TG 133, HDL 28, LDL 45, TSH normal 01/2019 - AST/ALT normal, albumin 4.0  Past Medical History:  Diagnosis Date  . Anemia   . Diverticulitis   . GERD (gastroesophageal reflux disease)   . Hyperlipidemia   . Hypertension   . Hypothyroidism   . Myocardial infarction ([facility]) 08/2017  . TIA (transient ischemic attack)    1 approx 2015, 1 approx 2018  . Vertigo    last episode several months ago  .  [name]     Past Surgical History:  Procedure Laterality Date  . COLONOSCOPY WITH PROPOFOL N/A 06/22/2018   Procedure: COLONOSCOPY WITH PROPOFOL;  Surgeon: Jonathon Bellows, MD;  Location: [facility] ENDOSCOPY;  Service: Endoscopy;  Laterality: N/A;  . CORONARY STENT INTERVENTION N/A 06/07/2019   Procedure: CORONARY STENT INTERVENTION;  Surgeon: [name], MD;  Location: [place] CV LAB;  Service: Cardiovascular;  Laterality:  N/A;  . ESOPHAGOGASTRODUODENOSCOPY (EGD) WITH PROPOFOL N/A 06/22/2018   Procedure: ESOPHAGOGASTRODUODENOSCOPY (EGD) WITH PROPOFOL;  Surgeon: Jonathon Bellows, MD;  Location[facility] ENDOSCOPY;  Service: Endoscopy;  Laterality: N/A;  . KNEE ARTHROSCOPY Right   . REPLACEMENT TOTAL KNEE BILATERAL    . RIGHT/LEFT HEART CATH AND CORONARY ANGIOGRAPHY N/A 06/07/2019   Procedure: RIGHT/LEFT HEART CATH AND CORONARY ANGIOGRAPHY;  Surgeon: [name], MD;  Location: [place] CV LAB;  Service: Cardiovascular;  Laterality: N/A;  . SHOULDER SURGERY Left     Current Medications: Current Meds  Medication Sig  . acetaminophen (TYLENOL) 325 MG tablet Take 2 tablets (650 mg total) by mouth every 6 (six) hours as needed for mild pain or headache.  . albuterol (PROVENTIL) (2.5 MG/3ML) 0.083% nebulizer solution Take 3 mLs (2.5 mg total) by nebulization every 4 (four) hours as needed.  Marland Kitchen allopurinol (ZYLOPRIM) 300 MG tablet TAKE 1 TABLET BY MOUTH ONCE DAILY (Patient taking differently: Take 300 mg by mouth daily. )  . amitriptyline (ELAVIL) 10 MG tablet TAKE 2 TABLETS BY MOUTH AT BEDTIME (Patient taking differently: Take 20 mg by mouth at bedtime. )  . aspirin EC 81 MG EC tablet Take 1 tablet (81 mg total) by mouth daily.  Marland Kitchen atorvastatin (LIPITOR) 40 MG tablet TAKE 1 TABLET BY MOUTH ONCE DAILY (Patient taking differently: Take 40 mg by mouth daily. )  . carvedilol (COREG) 12.5 MG tablet Take 1 tablet (12.5 mg total) by mouth 2 (two) times daily.  . clopidogrel (PLAVIX) 75 MG tablet Take 1 tablet by mouth once daily (Patient taking differently: Take 75 mg by mouth daily. )  . fluticasone (FLONASE) 50 MCG/ACT nasal spray USE 2 SPRAY(S) IN EACH NOSTRIL ONCE DAILY (Patient taking differently: Place 2 sprays into both nostrils daily. )  . hydrochlorothiazide (HYDRODIURIL) 25 MG tablet Take 1 tablet by mouth once daily (Patient taking differently: Take 25 mg by mouth daily. )  . levothyroxine (SYNTHROID) 137 MCG tablet  Take 1 tablet by mouth once daily (Patient taking differently: Take 137 mcg by mouth daily. )  . mirtazapine (REMERON) 30 MG tablet TAKE 1 TABLET BY MOUTH AT BEDTIME (Patient taking differently: Take 30 mg by mouth at bedtime. )  . mometasone (ELOCON) 0.1 % cream Apply 1 application topically daily.  . Multiple Vitamin (MULTIVITAMIN) capsule Take 1 capsule by mouth daily.  . nitroGLYCERIN (NITROSTAT) 0.4 MG SL tablet Place 1 tablet (0.4 mg total) under the tongue every 5 (five) minutes as needed for chest pain.  . pantoprazole (PROTONIX) 40 MG tablet Take 1 tablet (40 mg total) by mouth daily.  . sertraline (ZOLOFT) 25 MG tablet Take 1 tablet by mouth once daily (Patient taking differently: Take 25 mg by mouth daily. )    Allergies:   Latex, Levofloxacin, Lisinopril, Povidone-iodine, Simvastatin, and Codeine   Social History   Socioeconomic History  . Marital status: Divorced    Spouse name: na  . Number of children: 4  . Years of education: 52  . Highest education level: Associate degree: occupational, technical,  or vocational program  Occupational History  . Occupation: retired  Scientific laboratory technician  . Financial resource strain: Not hard at all  . Food insecurity    Worry: Never true    Inability: Never true  . Transportation needs    Medical: No    Non-medical: No  Tobacco Use  . Smoking status: Never Smoker  . Smokeless tobacco: Never Used  Substance and Sexual Activity  . Alcohol use: No    Alcohol/week: 0.0 standard drinks  . Drug use: No  . Sexual activity: Never  Lifestyle  . Physical activity    Days per week: Not on file    Minutes per session: Not on file  . Stress: To some extent  Relationships  . Social Herbalist on phone: Not on file    Gets together: Not on file    Attends religious service: Not on file    Active member of club or organization: Not on file    Attends meetings of clubs or organizations: Not on file    Relationship status: Not on file   Other Topics Concern  . Not on file  Social History Narrative  . Not on file     Family History:  The patient's family history includes Alzheimer's disease in her brother and brother; Breast cancer in her sister; Cancer in her brother; Clotting disorder in her daughter; Fibromyalgia in her daughter; Heart attack in her brother; Heart disease in her brother, brother, father, and mother; Hypertension in her mother; Mental illness in her brother; Stroke in her brother and mother.  ROS:   Review of Systems  Constitutional: Negative for chills, diaphoresis, fever, malaise/fatigue and weight loss.  HENT: Negative for congestion.   Eyes: Negative for discharge and redness.  Respiratory: Negative for cough, hemoptysis, sputum production, shortness of breath and wheezing.   Cardiovascular: Negative for chest pain, palpitations, orthopnea, claudication, leg swelling and PND.  Gastrointestinal: Negative for abdominal pain, blood in stool, heartburn, melena, nausea and vomiting.  Genitourinary: Negative for hematuria.  Musculoskeletal: Negative for falls and myalgias.  Skin: Negative for rash.  Neurological: Negative for dizziness, tingling, tremors, sensory change, speech change, focal weakness, loss of consciousness and weakness.  Endo/Heme/Allergies: Does not bruise/bleed easily.  Psychiatric/Behavioral: Negative for substance abuse. The patient is not nervous/anxious.   All other systems reviewed and are negative.    EKGs/Labs/Other Studies Reviewed:    Studies reviewed were summarized above. The additional studies were reviewed today:  2D Echo 06/2019: 1. The left ventricle has moderately reduced systolic function, with an ejection fraction of 35-40%. The cavity size was normal. There is moderately increased left ventricular wall thickness. Left ventricular diastolic Doppler parameters are  indeterminate. Left ventricular diffuse hypokinesis.  2. The right ventricle has normal systolic  function. The cavity was normal. There is no increase in right ventricular wall thickness.Unable to estimate RVSP  3. Frequent PVCs.  4. Left atrial size was moderately dilated. __________  [facility] 06/2019: Coronary dominance: Right  Left mainstem:  Large vessel that bifurcates into the LAD and left circumflex, no significant disease noted  Left anterior descending (LAD):   Large vessel that extends to the apical region, diagonal branch 2 of moderate size, proximal to mid LAD prior to large diagonal with 90% stenosis, hazy, heavily calcified.  Left circumflex (LCx):  Large vessel with OM branch 2, no significant disease noted, mild to moderate proximal to mid disease estimated 40%  Right coronary artery (RCA):  Right dominant  vessel with PL and PDA, no significant disease noted, very mild proximal disease estimated 30%  Left ventriculography: Aortic valve crossed for pressures, no significant there is no significant mitral regurgitation , no significant aortic valve stenosis Frequent ectopy noted  RA pressures mean of 8 RV pressure 32/7 mean 9 PA pressure 31/16 mean 21 Wedge pressure 10 Cardiac output 3.57 Cardiac index 2.02  Final Conclusions:   Culprit vessel for presentation is the proximal to mid LAD estimated at 90% Case discussed with Dr. Fletcher Anon, he will attempt PCI __________  PCI 06/2019:  Mid LAD lesion is 95% stenosed.  Post intervention, there is a 0% residual stenosis.  A drug-eluting stent was successfully placed using a STENT RESOLUTE ONYX 2.5X22.  2nd Diag lesion is 30% stenosed.   Successful angioplasty and drug-eluting stent placement to the mid LAD.  Recommendations: Dual antiplatelet therapy for at least 1 year. Blood pressure control and cardiac rehab.  Continue treatment of risk factors.3   EKG:  EKG is ordered today.  The EKG ordered today demonstrates NSR with sinus arrhythmia, 70 bpm, first-degree AV block, LBBB (known)  Recent Labs: 01/09/2019:  ALT 13 06/06/2019: B Natriuretic Peptide 246.0; TSH 0.432 06/21/2019: Magnesium 1.8 07/02/2019: BUN 21; Creatinine, Ser 1.36; Hemoglobin 12.0; Platelets 159; Potassium 4.0; Sodium 142  Recent Lipid Panel    Component Value Date/Time   CHOL 100 06/06/2019 1126   CHOL 114 03/07/2017 0948   CHOL 120 10/19/2011 0323   TRIG 133 06/06/2019 1126   TRIG 152 10/19/2011 0323   HDL 28 (L) 06/06/2019 1126   HDL 35 (L) 03/07/2017 0948   HDL 15 (L) 10/19/2011 0323   CHOLHDL 3.6 06/06/2019 1126   VLDL 27 06/06/2019 1126   VLDL 30 10/19/2011 0323   LDLCALC 45 06/06/2019 1126   LDLCALC 47 03/07/2017 0948   LDLCALC 75 10/19/2011 0323    PHYSICAL EXAM:    VS:  BP 140/80 (BP Location: Left Arm, Patient Position: Sitting, Cuff Size: Normal)   Pulse 70   Ht 5' 2.5" (1.588 m)   Wt 167 lb 8 oz (76 kg)   SpO2 97%   BMI 30.15 kg/m   BMI: Body mass index is 30.15 kg/m.  Physical Exam  Constitutional: She is oriented to person, place, and time. She appears well-developed and well-nourished.  HENT:  Head: Normocephalic and atraumatic.  Eyes: Right eye exhibits no discharge. Left eye exhibits no discharge.  Neck: Normal range of motion. No JVD present.  Cardiovascular: Normal rate, regular rhythm, S1 normal, S2 normal and normal heart sounds. Exam reveals no distant heart sounds, no friction rub, no midsystolic click and no opening snap.  No murmur heard. Pulses:      Posterior tibial pulses are 2+ on the right side and 2+ on the left side.  Pulmonary/Chest: Effort normal and breath sounds normal. No respiratory distress. She has no decreased breath sounds. She has no wheezes. She has no rales. She exhibits no tenderness.  Abdominal: Soft. She exhibits no distension. There is no abdominal tenderness.  Musculoskeletal:        General: No edema.  Neurological: She is alert and oriented to person, place, and time.  Skin: Skin is warm and dry. No cyanosis. Nails show no clubbing.  Psychiatric: She has a  normal mood and affect. Her speech is normal and behavior is normal. Judgment and thought content normal.  Vitals reviewed.   Wt Readings from Last 3 Encounters:  07/23/19 167 lb 8 oz (76 kg)  06/21/19 165 lb (74.8 kg)  06/12/19 166 lb (75.3 kg)     ASSESSMENT & PLAN:   1. CAD involving the native coronary arteries without angina: She denies any symptoms concerning for angina.  Continue secondary prevention with aspirin, Plavix, Coreg, and Lipitor.  Ecchymosis from right femoral cardiac cath site has resolved.  Ultrasound with no evidence of pseudoaneurysm or fistula.  No plans for further ischemic evaluation at this time.  2. HFrEF secondary to ICM: She appears euvolemic and well compensated.  Continue current therapy including Coreg and Entresto.  Given labile hypertension we will defer escalation of either of these medicines or addition of spironolactone at this time.  Keep scheduled echo for 09/2019 to reevaluate LV systolic function following revascularization and medical therapy.  CHF education.  3. Labile hypertension: Blood pressure is mildly elevated at triage today though the patient was upset earlier in the day.  BP at home has ranged in the 130s over 60s.  In this setting, we have deferred any changes at this time.  Continue current medications as outlined above.  4. Hyperlipidemia: LDL of 45 from 06/2019.  Remains on atorvastatin.  5. Anemia: Most recent CBC demonstrated improved hemoglobin.  Follow-up with PCP as directed.  6. CKD stage III: Check BMP today.  Disposition: F/u with Dr. Rockey Situ or an APP in 2 months.   Medication Adjustments/Labs and Tests Ordered: Current medicines are reviewed at length with the patient today.  Concerns regarding medicines are outlined above. Medication changes, Labs and Tests ordered today are summarized above and listed in the Patient Instructions accessible in Encounters.   Signed, Christell Faith, PA-C 07/23/2019 2:51 PM     [facility] [address] [place], [place] [address] [phone]

## 2019-07-11 NOTE — Telephone Encounter (Signed)
Ok to refill per Dr. Rosanna Randy. Patient is out of medication. Originally filled by cardiology.

## 2019-07-11 NOTE — Telephone Encounter (Signed)
Pt application completed and faxed to [employer].

## 2019-07-12 ENCOUNTER — Ambulatory Visit: Payer: Self-pay | Admitting: Family Medicine

## 2019-07-23 ENCOUNTER — Ambulatory Visit (INDEPENDENT_AMBULATORY_CARE_PROVIDER_SITE_OTHER): Payer: Medicare Other | Admitting: Physician Assistant

## 2019-07-23 ENCOUNTER — Encounter: Payer: Self-pay | Admitting: Physician Assistant

## 2019-07-23 ENCOUNTER — Other Ambulatory Visit: Payer: Self-pay

## 2019-07-23 VITALS — BP 140/80 | HR 70 | Ht 62.5 in | Wt 167.5 lb

## 2019-07-23 DIAGNOSIS — I2510 Atherosclerotic heart disease of native coronary artery without angina pectoris: Secondary | ICD-10-CM

## 2019-07-23 DIAGNOSIS — R0989 Other specified symptoms and signs involving the circulatory and respiratory systems: Secondary | ICD-10-CM

## 2019-07-23 DIAGNOSIS — I5020 Unspecified systolic (congestive) heart failure: Secondary | ICD-10-CM | POA: Diagnosis not present

## 2019-07-23 DIAGNOSIS — N183 Chronic kidney disease, stage 3 unspecified: Secondary | ICD-10-CM

## 2019-07-23 DIAGNOSIS — D509 Iron deficiency anemia, unspecified: Secondary | ICD-10-CM

## 2019-07-23 DIAGNOSIS — I255 Ischemic cardiomyopathy: Secondary | ICD-10-CM | POA: Diagnosis not present

## 2019-07-23 DIAGNOSIS — E785 Hyperlipidemia, unspecified: Secondary | ICD-10-CM | POA: Diagnosis not present

## 2019-07-23 NOTE — Patient Instructions (Signed)
Medication Instructions:  Your physician recommends that you continue on your current medications as directed. Please refer to the Current Medication list given to you today.  *If you need a refill on your cardiac medications before your next appointment, please call your pharmacy*  Lab Work: Your physician recommends that you have lab work today(BMET)  If you have labs (blood work) drawn today and your tests are completely normal, you will receive your results only by: Marland Kitchen MyChart Message (if you have MyChart) OR . A paper copy in the mail If you have any lab test that is abnormal or we need to change your treatment, we will call you to review the results.  Testing/Procedures: No new  Follow-Up: At [facility], you and your health needs are our priority.  As part of our continuing mission to provide you with exceptional heart care, we have created designated Provider Care Teams.  These Care Teams include your primary Cardiologist (physician) and Advanced Practice Providers (APPs -  Physician Assistants and Nurse Practitioners) who all work together to provide you with the care you need, when you need it.  Your next appointment:   2 months  The format for your next appointment:   In Person  Provider:    You may see Ida Rogue, MD or Christell Faith, PA-C.

## 2019-07-24 LAB — BASIC METABOLIC PANEL
BUN/Creatinine Ratio: 13 (ref 12–28)
BUN: 14 mg/dL (ref 8–27)
CO2: 24 mmol/L (ref 20–29)
Calcium: 8.9 mg/dL (ref 8.7–10.3)
Chloride: 105 mmol/L (ref 96–106)
Creatinine, Ser: 1.05 mg/dL — ABNORMAL HIGH (ref 0.57–1.00)
GFR calc Af Amer: 56 mL/min/{1.73_m2} — ABNORMAL LOW (ref >59)
GFR calc non Af Amer: 48 mL/min/{1.73_m2} — ABNORMAL LOW (ref >59)
Glucose: 108 mg/dL — ABNORMAL HIGH (ref 65–99)
Potassium: 4.3 mmol/L (ref 3.5–5.2)
Sodium: 141 mmol/L (ref 134–144)

## 2019-08-17 ENCOUNTER — Other Ambulatory Visit: Payer: Self-pay | Admitting: Family Medicine

## 2019-08-17 DIAGNOSIS — I10 Essential (primary) hypertension: Secondary | ICD-10-CM

## 2019-08-17 DIAGNOSIS — F3341 Major depressive disorder, recurrent, in partial remission: Secondary | ICD-10-CM

## 2019-09-06 ENCOUNTER — Other Ambulatory Visit: Payer: Self-pay | Admitting: Physician Assistant

## 2019-09-06 DIAGNOSIS — I255 Ischemic cardiomyopathy: Secondary | ICD-10-CM

## 2019-09-14 ENCOUNTER — Ambulatory Visit: Payer: Self-pay | Admitting: *Deleted

## 2019-09-14 NOTE — Telephone Encounter (Signed)
Attempted to notify pt 3 times regarding her symptom of diarrhea. No answer. Will route to [facility] for review.

## 2019-09-14 NOTE — Telephone Encounter (Signed)
Call place to patient. Left message to return call to office to discuss symptoms and  Diarrhea.

## 2019-09-17 NOTE — Telephone Encounter (Signed)
From [facility] 

## 2019-09-17 NOTE — Telephone Encounter (Signed)
LMOVM for pt to return call 

## 2019-09-18 ENCOUNTER — Ambulatory Visit (INDEPENDENT_AMBULATORY_CARE_PROVIDER_SITE_OTHER): Payer: Medicare Other

## 2019-09-18 ENCOUNTER — Other Ambulatory Visit: Payer: Self-pay

## 2019-09-18 DIAGNOSIS — I255 Ischemic cardiomyopathy: Secondary | ICD-10-CM | POA: Diagnosis not present

## 2019-09-20 ENCOUNTER — Telehealth: Payer: Self-pay

## 2019-09-20 NOTE — Telephone Encounter (Signed)
Copied from [place] [id number]. Topic: Appointment Scheduling - Scheduling Inquiry for Clinic >> Sep 20, 2019  9:36 AM Scherrie Gerlach wrote: Reason for CRM: pt has diarrhea for over a week.  Pt states some days just like water running.  Pt declined appt with anyone else. Advised pt she cannot come in office.  She hopes Dr Rosanna Randy can call her.

## 2019-09-20 NOTE — Telephone Encounter (Signed)
Schedule covid test. Push fluids if no pain or bleeding.

## 2019-09-21 NOTE — Telephone Encounter (Signed)
Patient was advised. Patient states she has not had any diarrhea since yesterday. Patient wants to try fluids for now. Advised patient we can schedule a telephone visit for this afternoon with another provider, but pt wants to wait since she is feeling better.

## 2019-09-24 ENCOUNTER — Ambulatory Visit: Payer: Medicare Other | Admitting: Cardiovascular Disease

## 2019-09-25 DIAGNOSIS — Z872 Personal history of diseases of the skin and subcutaneous tissue: Secondary | ICD-10-CM | POA: Diagnosis not present

## 2019-09-25 DIAGNOSIS — L570 Actinic keratosis: Secondary | ICD-10-CM | POA: Diagnosis not present

## 2019-09-25 DIAGNOSIS — L853 Xerosis cutis: Secondary | ICD-10-CM | POA: Diagnosis not present

## 2019-09-25 DIAGNOSIS — L578 Other skin changes due to chronic exposure to nonionizing radiation: Secondary | ICD-10-CM | POA: Diagnosis not present

## 2019-09-29 ENCOUNTER — Other Ambulatory Visit: Payer: Self-pay | Admitting: Family Medicine

## 2019-09-29 DIAGNOSIS — J301 Allergic rhinitis due to pollen: Secondary | ICD-10-CM

## 2019-10-01 ENCOUNTER — Ambulatory Visit: Payer: Medicare Other | Admitting: Family

## 2019-10-03 ENCOUNTER — Ambulatory Visit (INDEPENDENT_AMBULATORY_CARE_PROVIDER_SITE_OTHER): Payer: Medicare Other | Admitting: Family

## 2019-10-03 ENCOUNTER — Encounter: Payer: Self-pay | Admitting: Family

## 2019-10-03 ENCOUNTER — Other Ambulatory Visit: Payer: Self-pay

## 2019-10-03 VITALS — BP 142/60 | HR 70 | Ht 62.0 in | Wt 166.2 lb

## 2019-10-03 DIAGNOSIS — I2510 Atherosclerotic heart disease of native coronary artery without angina pectoris: Secondary | ICD-10-CM

## 2019-10-03 DIAGNOSIS — I255 Ischemic cardiomyopathy: Secondary | ICD-10-CM

## 2019-10-03 DIAGNOSIS — I10 Essential (primary) hypertension: Secondary | ICD-10-CM

## 2019-10-03 DIAGNOSIS — N183 Chronic kidney disease, stage 3 unspecified: Secondary | ICD-10-CM

## 2019-10-03 DIAGNOSIS — I5042 Chronic combined systolic (congestive) and diastolic (congestive) heart failure: Secondary | ICD-10-CM

## 2019-10-03 DIAGNOSIS — I5020 Unspecified systolic (congestive) heart failure: Secondary | ICD-10-CM | POA: Diagnosis not present

## 2019-10-03 NOTE — Progress Notes (Signed)
Office Visit    Patient Name: Stephanie Charles Date of Encounter: 10/03/2019  Primary Care Provider:  Jerrol Banana., MD Primary Cardiologist:  Ida Rogue, MD Electrophysiologist:  None   Chief Complaint    Stephanie Charles is a 85 y.o. female with a hx of CAD with prior NSTEMI 2016 and NSTEMI 9/20 [place] s/p PCI/DES to LAD, combined systolic and diastolic heart failure secondary to ischemic cardiomyopathy, hypertensive heart disease with labile BP, TIA/posterior CVA, LBBB, mild bilateral carotid artery disease, hypothyroidism, positive FOBT 04/2018, diverticulitis, normocytic anemia, gout, anxiety, depression, GERD presents today for follow-up of heart failure after echocardiogram.  Past Medical History    Past Medical History:  Diagnosis Date  . Anemia   . Diverticulitis   . GERD (gastroesophageal reflux disease)   . Hyperlipidemia   . Hypertension   . Hypothyroidism   . Myocardial infarction ([place]) 08/2017  . TIA (transient ischemic attack)    1 approx 2015, 1 approx 2018  . Vertigo    last episode several months ago  . [name]    Past Surgical History:  Procedure Laterality Date  . COLONOSCOPY WITH PROPOFOL N/A 06/22/2018   Procedure: COLONOSCOPY WITH PROPOFOL;  Surgeon: Jonathon Bellows, MD;  Location: [facility] ENDOSCOPY;  Service: Endoscopy;  Laterality: N/A;  . CORONARY STENT INTERVENTION N/A 06/07/2019   Procedure: CORONARY STENT INTERVENTION;  Surgeon: [name], MD;  Location: [place] CV LAB;  Service: Cardiovascular;  Laterality: N/A;  . ESOPHAGOGASTRODUODENOSCOPY (EGD) WITH PROPOFOL N/A 06/22/2018   Procedure: ESOPHAGOGASTRODUODENOSCOPY (EGD) WITH PROPOFOL;  Surgeon: Jonathon Bellows, MD;  Location: [facility] ENDOSCOPY;  Service: Endoscopy;  Laterality: N/A;  . KNEE ARTHROSCOPY Right   . REPLACEMENT TOTAL KNEE BILATERAL    . RIGHT/LEFT HEART CATH AND CORONARY ANGIOGRAPHY N/A 06/07/2019   Procedure: RIGHT/LEFT HEART CATH AND CORONARY  ANGIOGRAPHY;  Surgeon: [name], MD;  Location: [place] [facility] LAB;  Service: Cardiovascular;  Laterality: N/A;  . SHOULDER SURGERY Left     Allergies  Allergies  Allergen Reactions  . Latex Itching  . Levofloxacin     Mouth sores  . Lisinopril     Cough?  . Povidone-Iodine Other (See Comments)    Severe blistering and itchiness. Severe redness  . Simvastatin     Myalgias  . Codeine Nausea And Vomiting    History of Present Illness    Stephanie Charles is a 85 y.o. female with a hx of  CAD with prior NSTEMI 2016 and NSTEMI 9/20 [place] s/p PCI/DES to LAD, combined systolic and diastolic heart failure secondary to ischemic cardiomyopathy, hypertensive heart disease with labile BP, TIA/posterior CVA, LBBB, mild bilateral carotid artery disease, hypothyroidism, positive FOBT 04/2018, diverticulitis, normocytic anemia, gout, anxiety, depression, GERD  last seen 07/23/2019 by Christell Faith, PA.  Previously followed by Dr. Ubaldo Glassing, transition of care to [facility] her care during 06/2019 admission.  Prior Holter 2017 NSR with occasional O VCM PAC with NSVT/WCT without sustained arrhythmia.  Stress test 12/2016 - for significant ischemia with normal LV SF.  Echo 12/2016 normal LV SF with EF 55% and mild MR/TR.  Admitted to [facility] 06/2019 with chest pain and palpitations with poorly controlled BP systolic in the [id number].  Echo EF 35 to 40%, diffuse HK, normal RV SF and cavity size, moderately dilated LA.  Frequent PVCs noted.  High-sensitivity troponin peaked at 296.  Underwent right and left heart catheterization with mid LAD 95% stenosis s/p successful  PCI/DES, D2 to 30% stenosis.  RHC with mean RA pressure of 8, RV pressure 32/7 with a mean of 9, PA pressure 31/16 with a mean of 21, PCWP 10, CO/CI 2.57/2.02  Hospital follow-up 06/21/2019 extensive ecchymosis under discomfort at right femoral cardiac cath site.  Underwent stat ultrasound due to bruit which was negative for pseudoaneurysm with small fluid  collection noted.  Due to cardiomyopathy and combined systolic and diastolic heart failure she was transitioned to [place].  She has been continued on DAPT, Lipitor, Coreg, HCTZ as needed.  Repeat echo 09/18/2019 shows LVEF improvement to 40 to 45%, normal LV size, moderate LVH, paradoxical septal motion consistent with LBBB, grade 1 diastolic dysfunction, trivial TR, mild pulmonic valve regurgitation.  She reports feeling overall well.  She is present today with her daughter.  She is very happy about the results of her recent echocardiogram which we again reviewed.  She reports no shortness of breath at rest.  Does endorse continued dyspnea on exertion that is overall improving.  She has had some wheezing and has been doing her breathing treatment every other night with some improvement in her wheeze.  No cough.  Tells me her lower extremity edema has been much improved since being on the [place].  She is hopeful to remain on this medication as it is caused much improvement in her symptoms and is awaiting repeat patient assistance for the upcoming year.  She reports he intermittently notices palpitations and they feel like a "skipped beat".  They are not associated with pain or shortness of breath.  EKGs/Labs/Other Studies Reviewed:   The following studies were reviewed today:  Echo 09/2019  1. Left ventricular ejection fraction, by visual estimation, is 40 to 45%. The left ventricle has moderately decreased function. There is moderately increased left ventricular hypertrophy.  2. Abnormal septal motion consistent with left bundle branch block.  3. Left ventricular diastolic parameters are consistent with Grade I diastolic dysfunction (impaired relaxation).  4. The left ventricle demonstrates global hypokinesis.  5. Global right ventricle has moderately reduced systolic function.The right ventricular size is normal. Mildly increased right ventricular wall thickness.  6. Left atrial size was  moderately dilated.  7. Right atrial size was not well visualized.  8. Presence of pericardial fat pad.  9. The mitral valve was not well visualized. Trivial mitral valve regurgitation. No evidence of mitral stenosis. 10. The tricuspid valve is not well visualized. Tricuspid valve regurgitation is not demonstrated. 11. The aortic valve is tricuspid. Aortic valve regurgitation is not visualized. No evidence of aortic valve sclerosis or stenosis. 12. Pulmonic regurgitation is mild. 13. The pulmonic valve was not well visualized. Pulmonic valve regurgitation is mild. 14. TR signal is inadequate for assessing pulmonary artery systolic pressure. 15. The inferior vena cava is dilated in size with >50% respiratory variability, suggesting right atrial pressure of 8 mmHg. 16. The interatrial septum was not well visualized.  2D Echo 06/2019: 1. The left ventricle has moderately reduced systolic function, with an ejection fraction of 35-40%. The cavity size was normal. There is moderately increased left ventricular wall thickness. Left ventricular diastolic Doppler parameters are  indeterminate. Left ventricular diffuse hypokinesis.  2. The right ventricle has normal systolic function. The cavity was normal. There is no increase in right ventricular wall thickness.Unable to estimate RVSP  3. Frequent PVCs.  4. Left atrial size was moderately dilated. __________  [facility] 06/2019: Coronary dominance: Right  Left mainstem:  Large vessel that bifurcates into the LAD and  left circumflex, no significant disease noted  Left anterior descending (LAD):   Large vessel that extends to the apical region, diagonal branch 2 of moderate size, proximal to mid LAD prior to large diagonal with 90% stenosis, hazy, heavily calcified.  Left circumflex (LCx):  Large vessel with OM branch 2, no significant disease noted, mild to moderate proximal to mid disease estimated 40%  Right coronary artery (RCA):  Right dominant  vessel with PL and PDA, no significant disease noted, very mild proximal disease estimated 30%  Left ventriculography: Aortic valve crossed for pressures, no significant there is no significant mitral regurgitation , no significant aortic valve stenosis Frequent ectopy noted  RA pressures mean of 8 RV pressure 32/7 mean 9 PA pressure 31/16 mean 21 Wedge pressure 10 Cardiac output 3.57 Cardiac index 2.02  Final Conclusions:   Culprit vessel for presentation is the proximal to mid LAD estimated at 90% Case discussed with Dr. Fletcher Anon, he will attempt PCI __________   PCI 06/2019:  Mid LAD lesion is 95% stenosed.  Post intervention, there is a 0% residual stenosis.  A drug-eluting stent was successfully placed using a STENT RESOLUTE ONYX 2.5X22.  2nd Diag lesion is 30% stenosed.   Successful angioplasty and drug-eluting stent placement to the mid LAD.   Recommendations: Dual antiplatelet therapy for at least 1 year. Blood pressure control and cardiac rehab.  Continue treatment of risk factors.3  EKG:  EKG is ordered today.  The ekg ordered today demonstrates sinus and first-degree AV block and PVC, LBBB.  Rate 70 bpm  Recent Labs: 01/09/2019: ALT 13 06/06/2019: B Natriuretic Peptide 246.0; TSH 0.432 06/21/2019: Magnesium 1.8 07/02/2019: Hemoglobin 12.0; Platelets 159 07/23/2019: BUN 14; Creatinine, Ser 1.05; Potassium 4.3; Sodium 141  Recent Lipid Panel    Component Value Date/Time   CHOL 100 06/06/2019 1126   CHOL 114 03/07/2017 0948   CHOL 120 10/19/2011 0323   TRIG 133 06/06/2019 1126   TRIG 152 10/19/2011 0323   HDL 28 (L) 06/06/2019 1126   HDL 35 (L) 03/07/2017 0948   HDL 15 (L) 10/19/2011 0323   CHOLHDL 3.6 06/06/2019 1126   VLDL 27 06/06/2019 1126   VLDL 30 10/19/2011 0323   LDLCALC 45 06/06/2019 1126   LDLCALC 47 03/07/2017 0948   LDLCALC 75 10/19/2011 0323    Home Medications   No outpatient medications have been marked as taking for the 10/03/19 encounter  (Office Visit) with Loel Dubonnet, NP.      Review of Systems    Review of Systems  Constitution: Negative for chills, fever and malaise/fatigue.  Cardiovascular: Positive for dyspnea on exertion. Negative for chest pain, irregular heartbeat, leg swelling, near-syncope, orthopnea, palpitations and syncope.  Respiratory: Positive for wheezing. Negative for cough and shortness of breath.   Gastrointestinal: Negative for melena, nausea and vomiting.  Genitourinary: Negative for hematuria.  Neurological: Negative for dizziness, light-headedness and weakness.   All other systems reviewed and are otherwise negative except as noted above.  Physical Exam    VS:  BP (!) 142/60   Pulse 70   Ht 5\' 2"  (1.575 m)   Wt 166 lb 4 oz (75.4 kg)   SpO2 96%   BMI 30.41 kg/m  , BMI Body mass index is 30.41 kg/m. GEN: Well nourished, well developed, in no acute distress. HEENT: normal. Neck: Supple, no JVD, carotid bruits, or masses. Cardiac: RRR, no murmurs, rubs, or gallops. No clubbing, cyanosis, edema.  Radials/DP/PT 2+ and equal bilaterally.  Respiratory:  Respirations regular and unlabored, clear to auscultation bilaterally. GI: Soft, nontender, nondistended, BS + x 4. MS: No deformity or atrophy. Skin: Warm and dry, no rash. Neuro:  Strength and sensation are intact. Psych: Normal affect.  Accessory Clinical Findings    ECG personally reviewed by me today -sinus rhythm 70 bpm with first-degree AV block, PVC, known LBBB- no acute changes.  Assessment & Plan    1. Combined systolic and diastolic heart failure due to ICM - Echo 06/2019 LVEF 35-40% in setting of CAD/ICM. S/p DES LAD 06/2019 and optimization of heart failure therapies including Entresto. Repeat echo 09/2019 with LVEF 40-45%, moderate LVH, RV moderately reduced systolic function, [id number].   NYHA class II symptoms with DOE. She is euvolemic on exam today. Weight stable compared to last office visit.  Continue GDMT of  Carvedilol, Entresto. She is on moderate dose Entresto 49/51 BID and been unable to further up-titrate due to CKDIII and labile BP.  CMET today. Consider further optimization of heart failure therapies such as addition of MRA if renal function will allow.  Continue low sodium, heart healthy diet.   2. CAD - No anginal symptoms. EKG today with SR 1st degree AV block PVC, LBBB - no acute ST/T wave changes. No indication for ischemic evaluation. Continue GDMT of DAPT aspirin/plavix, coreg, lipitor. Her DES to mid LAD was placed 06/07/19 and she was recommended at least 1 year of DAPT. Denies bleeding complications.  3. HTN - BP reasonable today. Reports BP well controlled at home. She has had some labile BP in the past. Continue present antihypertensive regimen.   4. LBBB - Known hx. Noted on EKG today. Asymptomatic. Continue to monitor with EKG q6-q12 months.  5. PVC - Known hx. Noted on EKG today. She is aware of intermittent palpitations but reports they are not bothersome. Recommend avoid caffeine. Continue beta blocker.   6. HLD, LDL goal <70 - 06/2019 LDL 45. Continue Atorvastatin.   7. Anemia - Most recent hb 12 in September. No hematuria, melena. Recheck CBC in setting of continued DOE.  8. CKDIII - CMET today. Careful titration of antihypertensive, heart failure, and diuretic therapies.  Disposition: Will contact [employer] to see what additional info they need from [name] for her patient assistance for Entresto. CMET/CBC today. Consider addition MRA. Follow up in 3 month(s) with Dr. Rockey Situ or APP  Loel Dubonnet, NP 10/03/2019, 11:13 AM

## 2019-10-03 NOTE — Patient Instructions (Signed)
Medication Instructions:  No medication changes today.  *If you need a refill on your cardiac medications before your next appointment, please call your pharmacy*  Lab Work: Your physician recommends that you return for lab work today: CMET, CBC  If you have labs (blood work) drawn today and your tests are completely normal, you will receive your results only by: Marland Kitchen MyChart Message (if you have MyChart) OR . A paper copy in the mail If you have any lab test that is abnormal or we need to change your treatment, we will call you to review the results.  Testing/Procedures: You had an EKG today. It showed sinus rhythm with PVC (early beats in the bottom chamber of your heart which are not dangerous) and a left bundle branch block (slowing of one of the three electrical pathways which is very common and not dangerous)  Follow-Up: At [facility], you and your health needs are our priority.  As part of our continuing mission to provide you with exceptional heart care, we have created designated Provider Care Teams.  These Care Teams include your primary Cardiologist (physician) and Advanced Practice Providers (APPs -  Physician Assistants and Nurse Practitioners) who all work together to provide you with the care you need, when you need it.  Your next appointment:   3 month(s)  The format for your next appointment:   In Person  Provider:   Ida Rogue, MD  Other Instructions

## 2019-10-04 LAB — CBC WITH DIFFERENTIAL/PLATELET
Basophils Absolute: 0.1 10*3/uL (ref 0.0–0.2)
Basos: 1 %
EOS (ABSOLUTE): 0.3 10*3/uL (ref 0.0–0.4)
Eos: 3 %
Hematocrit: 35.9 % (ref 34.0–46.6)
Hemoglobin: 11.9 g/dL (ref 11.1–15.9)
Immature Grans (Abs): 0 10*3/uL (ref 0.0–0.1)
Immature Granulocytes: 0 %
Lymphocytes Absolute: 1.8 10*3/uL (ref 0.7–3.1)
Lymphs: 21 %
MCH: 29.8 pg (ref 26.6–33.0)
MCHC: 33.1 g/dL (ref 31.5–35.7)
MCV: 90 fL (ref 79–97)
Monocytes Absolute: 0.4 10*3/uL (ref 0.1–0.9)
Monocytes: 5 %
Neutrophils Absolute: 6 10*3/uL (ref 1.4–7.0)
Neutrophils: 70 %
Platelets: 182 10*3/uL (ref 150–450)
RBC: 3.99 x10E6/uL (ref 3.77–5.28)
RDW: 14.1 % (ref 11.7–15.4)
WBC: 8.6 10*3/uL (ref 3.4–10.8)

## 2019-10-04 LAB — COMPREHENSIVE METABOLIC PANEL
ALT: 20 IU/L (ref 0–32)
AST: 24 IU/L (ref 0–40)
Albumin/Globulin Ratio: 1.9 (ref 1.2–2.2)
Albumin: 4.2 g/dL (ref 3.6–4.6)
Alkaline Phosphatase: 150 IU/L — ABNORMAL HIGH (ref 39–117)
BUN/Creatinine Ratio: 16 (ref 12–28)
BUN: 23 mg/dL (ref 8–27)
Bilirubin Total: 0.4 mg/dL (ref 0.0–1.2)
CO2: 23 mmol/L (ref 20–29)
Calcium: 9.4 mg/dL (ref 8.7–10.3)
Chloride: 101 mmol/L (ref 96–106)
Creatinine, Ser: 1.44 mg/dL — ABNORMAL HIGH (ref 0.57–1.00)
GFR calc Af Amer: 38 mL/min/{1.73_m2} — ABNORMAL LOW (ref >59)
GFR calc non Af Amer: 33 mL/min/{1.73_m2} — ABNORMAL LOW (ref >59)
Globulin, Total: 2.2 g/dL (ref 1.5–4.5)
Glucose: 101 mg/dL — ABNORMAL HIGH (ref 65–99)
Potassium: 4.5 mmol/L (ref 3.5–5.2)
Sodium: 140 mmol/L (ref 134–144)
Total Protein: 6.4 g/dL (ref 6.0–8.5)

## 2019-10-09 NOTE — Addendum Note (Signed)
Addended by: [name] on: 10/09/2019 08:53 AM   Modules accepted: Orders

## 2019-10-10 ENCOUNTER — Other Ambulatory Visit: Payer: Self-pay

## 2019-10-10 DIAGNOSIS — I10 Essential (primary) hypertension: Secondary | ICD-10-CM

## 2019-10-11 ENCOUNTER — Telehealth: Payer: Self-pay | Admitting: Physician Assistant

## 2019-10-11 ENCOUNTER — Telehealth: Payer: Self-pay

## 2019-10-11 NOTE — Telephone Encounter (Signed)
Please call with lab results. Patient would also like to discuss Entresto, unable to afford.

## 2019-10-11 NOTE — Telephone Encounter (Signed)
Spoke with the patient who was given her results and information regarding her medication assistance.

## 2019-10-11 NOTE — Telephone Encounter (Signed)
-----   Message from Loel Dubonnet, NP sent at 10/04/2019 11:08 AM EST ----- CBC looks great! Hb stable at 11.9 (was 12.0 3 months ago) - this is a normal result. Continue dietary sources of iron (green leafy vegetables, whole grains).  Electrolytes normal. Liver enzymes normal. Kidney function shows very small decrease compared to previous but overall stable. Would like to recheck BMET in 1 month.   Please inform patient, paperwork was faxed to [employer] patient assistance yesterday and I called [employer] today to inform them it was sent. It may take a few days for their system to update from what they told me.

## 2019-10-11 NOTE — Telephone Encounter (Signed)
Left voicemail message for patient to call back about her results and that application for assistance had been faxed.

## 2019-10-17 NOTE — Telephone Encounter (Signed)
Spoke with patient and she did get lab results and recommendations. She has not heard back from assistance program and is going to run out of her Entresto at the end of this week. Advised that I would try to call them to check on the status and also notify provider if she is not able to continue. She inquired about a cheaper option if she is unable to get assistance. Let her know that I would follow up on this and be in touch. She verbalized understanding and had no further questions at this time.

## 2019-10-23 NOTE — Telephone Encounter (Signed)
Spoke with [name] and they need patient to call for them to mail out her medication.

## 2019-10-23 NOTE — Telephone Encounter (Signed)
Spoke with patient and reviewed that she would need to call company in order for them to send her medication. Provided her with number and she verbalized understanding with no further questions.

## 2019-11-02 ENCOUNTER — Other Ambulatory Visit
Admission: RE | Admit: 2019-11-02 | Discharge: 2019-11-02 | Disposition: A | Discharge status: Home / Self Care | Payer: Medicare Other | Source: Ambulatory Visit | Attending: Family | Admitting: Family

## 2019-11-02 DIAGNOSIS — I10 Essential (primary) hypertension: Secondary | ICD-10-CM | POA: Diagnosis not present

## 2019-11-02 LAB — BASIC METABOLIC PANEL
Anion gap: 4 — ABNORMAL LOW (ref 5–15)
BUN: 20 mg/dL (ref 8–23)
CO2: 32 mmol/L (ref 22–32)
Calcium: 9.2 mg/dL (ref 8.9–10.3)
Chloride: 98 mmol/L (ref 98–111)
Creatinine, Ser: 1.12 mg/dL — ABNORMAL HIGH (ref 0.44–1.00)
GFR calc Af Amer: 51 mL/min — ABNORMAL LOW (ref >60)
GFR calc non Af Amer: 44 mL/min — ABNORMAL LOW (ref >60)
Glucose, Bld: 103 mg/dL — ABNORMAL HIGH (ref 70–99)
Potassium: 4.4 mmol/L (ref 3.5–5.1)
Sodium: 134 mmol/L — ABNORMAL LOW (ref 135–145)

## 2019-11-13 ENCOUNTER — Other Ambulatory Visit: Payer: Self-pay | Admitting: Family Medicine

## 2019-11-13 DIAGNOSIS — G4700 Insomnia, unspecified: Secondary | ICD-10-CM

## 2019-11-13 NOTE — Telephone Encounter (Signed)
Requested Prescriptions  Pending Prescriptions Disp Refills  . pantoprazole (PROTONIX) 40 MG tablet [Pharmacy Med Name: Pantoprazole Sodium 40 MG Oral Tablet Delayed Release] 90 tablet 0    Sig: Take 1 tablet by mouth once daily     Gastroenterology: Proton Pump Inhibitors Passed - 11/13/2019 11:22 AM      Passed - Valid encounter within last 12 months    Recent Outpatient Visits          5 months ago Non-ST elevation (NSTEMI) myocardial infarction [place])   [facility] Jerrol Banana., MD   6 months ago Iron deficiency anemia due to chronic blood loss   [facility] Jerrol Banana., MD   7 months ago Anemia, unspecified type   [facility] Jerrol Banana., MD   8 months ago Anemia, unspecified type   [facility] Jerrol Banana., MD   9 months ago Anemia, unspecified type   [facility] Jerrol Banana., MD      Future Appointments            In 1 month Gollan, Kathlene November, MD [facility], [place]

## 2019-12-01 DIAGNOSIS — Z03818 Encounter for observation for suspected exposure to other biological agents ruled out: Secondary | ICD-10-CM | POA: Diagnosis not present

## 2019-12-06 DIAGNOSIS — Z03818 Encounter for observation for suspected exposure to other biological agents ruled out: Secondary | ICD-10-CM | POA: Diagnosis not present

## 2019-12-31 NOTE — Progress Notes (Signed)
Cardiology Office Note  Date:  01/01/2020   ID:  Stephanie Charles, DOB 1930-02-24, MRN [id number]  PCP:  Jerrol Banana., MD   Chief Complaint  Patient presents with  . Other    Patient states she received her 2nd COVID vaccine 2 weeks alot and she was having palpitations/Fluttering only lasting a day. Meds reviewed verbally with patient.     HPI:  Stephanie Charles is a 86 y.o. female with history of  demand ischemia with hypertensive urgency,  LBBB,  TIA/CVA in 2016,  CKD stage II,  HTN,  HLD,  carotid artery disease,  hypothyroidism,  anxiety, depression,  Coronary disease, stent placed to proximal to mid LAD Who presents for follow-up of her ischemic cardiomyopathy  Seen in the hospital September 2020, ejection fraction 35 to 40% She had symptoms of chest pain and accelerated HTN found to have elevated high sensitivity troponin peaking at 0000000 and acute systolic CHF.    cardiac catheterization, with severe proximal to mid LAD disease, stent placed  Follow-up echocardiogram December 2020 ejection fraction 40 up to 45% Tolerating Entresto, carvedilol, HCTZ Reports blood pressure stable at home  Reports that she is walking at home, doing some exercises  Lab work reviewed, stable creatinine 1.12 in January 2021  EKG personally reviewed by myself on todays visit Shows normal sinus rhythm first-degree AV block left bundle branch block  PMH:   has a past medical history of Anemia, Diverticulitis, GERD (gastroesophageal reflux disease), Hyperlipidemia, Hypertension, Hypothyroidism, Myocardial infarction ([place]) (08/2017), TIA (transient ischemic attack), Vertigo, and [name].  PSH:    Past Surgical History:  Procedure Laterality Date  . COLONOSCOPY WITH PROPOFOL N/A 06/22/2018   Procedure: COLONOSCOPY WITH PROPOFOL;  Surgeon: Jonathon Bellows, MD;  Location: [facility] ENDOSCOPY;  Service: Endoscopy;  Laterality: N/A;  . CORONARY STENT INTERVENTION N/A 06/07/2019    Procedure: CORONARY STENT INTERVENTION;  Surgeon: [name], MD;  Location: [place] CV LAB;  Service: Cardiovascular;  Laterality: N/A;  . ESOPHAGOGASTRODUODENOSCOPY (EGD) WITH PROPOFOL N/A 06/22/2018   Procedure: ESOPHAGOGASTRODUODENOSCOPY (EGD) WITH PROPOFOL;  Surgeon: Jonathon Bellows, MD;  Location: [facility] ENDOSCOPY;  Service: Endoscopy;  Laterality: N/A;  . KNEE ARTHROSCOPY Right   . REPLACEMENT TOTAL KNEE BILATERAL    . RIGHT/LEFT HEART CATH AND CORONARY ANGIOGRAPHY N/A 06/07/2019   Procedure: RIGHT/LEFT HEART CATH AND CORONARY ANGIOGRAPHY;  Surgeon: [name], MD;  Location: [place] CV LAB;  Service: Cardiovascular;  Laterality: N/A;  . SHOULDER SURGERY Left     Current Outpatient Medications  Medication Sig Dispense Refill  . acetaminophen (TYLENOL) 325 MG tablet Take 2 tablets (650 mg total) by mouth every 6 (six) hours as needed for mild pain or headache.    . albuterol (PROVENTIL) (2.5 MG/3ML) 0.083% nebulizer solution Take 3 mLs (2.5 mg total) by nebulization every 4 (four) hours as needed. 75 mL 3  . allopurinol (ZYLOPRIM) 300 MG tablet Take 1 tablet (300 mg total) by mouth daily. 90 tablet 3  . amitriptyline (ELAVIL) 10 MG tablet TAKE 2 TABLETS BY MOUTH AT BEDTIME 180 tablet 0  . aspirin EC 81 MG EC tablet Take 1 tablet (81 mg total) by mouth daily.    Marland Kitchen atorvastatin (LIPITOR) 40 MG tablet Take 1 tablet (40 mg total) by mouth daily. 90 tablet 3  . carvedilol (COREG) 12.5 MG tablet Take 1 tablet (12.5 mg total) by mouth 2 (two) times daily. 60 tablet 5  . clopidogrel (PLAVIX) 75  MG tablet Take 1 tablet by mouth once daily (Patient taking differently: Take 75 mg by mouth daily. ) 90 tablet 3  . fluticasone (FLONASE) 50 MCG/ACT nasal spray Use 2 spray(s) in each nostril once daily 48 g 3  . hydrochlorothiazide (HYDRODIURIL) 25 MG tablet Take 1 tablet (25 mg total) by mouth daily. 90 tablet 3  . levothyroxine (SYNTHROID) 137 MCG tablet Take 1 tablet by mouth once  daily (Patient taking differently: Take 137 mcg by mouth daily. ) 90 tablet 3  . mirtazapine (REMERON) 30 MG tablet TAKE 1 TABLET BY MOUTH AT BEDTIME 90 tablet 3  . mometasone (ELOCON) 0.1 % cream Apply 1 application topically daily. 45 g 0  . Multiple Vitamin (MULTIVITAMIN) capsule Take 1 capsule by mouth daily.    . pantoprazole (PROTONIX) 40 MG tablet Take 1 tablet by mouth once daily 90 tablet 0  . sacubitril-valsartan (ENTRESTO) 49-51 MG Take 1 tablet by mouth 2 (two) times daily. 60 tablet 5  . sertraline (ZOLOFT) 25 MG tablet Take 1 tablet by mouth once daily (Patient taking differently: Take 25 mg by mouth daily. ) 90 tablet 3  . nitroGLYCERIN (NITROSTAT) 0.4 MG SL tablet Place 1 tablet (0.4 mg total) under the tongue every 5 (five) minutes as needed for chest pain. 25 tablet 3   No current facility-administered medications for this visit.     Allergies:   Latex, Levofloxacin, Lisinopril, Povidone-iodine, Simvastatin, and Codeine   Social History:  The patient  reports that she has never smoked. She has never used smokeless tobacco. She reports that she does not drink alcohol or use drugs.   Family History:   family history includes Alzheimer's disease in her brother and brother; Breast cancer in her sister; Cancer in her brother; Clotting disorder in her daughter; Fibromyalgia in her daughter; Heart attack in her brother; Heart disease in her brother, brother, father, and mother; Hypertension in her mother; Mental illness in her brother; Stroke in her brother and mother.    Review of Systems: Review of Systems  Constitutional: Negative.   HENT: Negative.   Respiratory: Negative.   Cardiovascular: Negative.   Gastrointestinal: Negative.   Musculoskeletal: Negative.   Neurological: Negative.   Psychiatric/Behavioral: Negative.   All other systems reviewed and are negative.   PHYSICAL EXAM: VS:  BP 140/62 (BP Location: Right Arm, Patient Position: Sitting, Cuff Size: Normal)    Pulse 70   Ht 5\' 2"  (1.575 m)   Wt 166 lb 6 oz (75.5 kg)   SpO2 96%   BMI 30.43 kg/m  , BMI Body mass index is 30.43 kg/m. GEN: Well nourished, well developed, in no acute distress HEENT: normal Neck: no JVD, carotid bruits, or masses Cardiac: RRR; no murmurs, rubs, or gallops,no edema  Respiratory:  clear to auscultation bilaterally, normal work of breathing GI: soft, nontender, nondistended, + BS MS: no deformity or atrophy Skin: warm and dry, no rash Neuro:  Strength and sensation are intact Psych: euthymic mood, full affect   Recent Labs: 06/06/2019: B Natriuretic Peptide 246.0; TSH 0.432 06/21/2019: Magnesium 1.8 10/03/2019: ALT 20; Hemoglobin 11.9; Platelets 182 11/02/2019: BUN 20; Creatinine, Ser 1.12; Potassium 4.4; Sodium 134    Lipid Panel Lab Results  Component Value Date   CHOL 100 06/06/2019   HDL 28 (L) 06/06/2019   LDLCALC 45 06/06/2019   TRIG 133 06/06/2019      Wt Readings from Last 3 Encounters:  01/01/20 166 lb 6 oz (75.5 kg)  10/03/19 166 lb  4 oz (75.4 kg)  07/23/19 167 lb 8 oz (76 kg)     ASSESSMENT AND PLAN:  Problem List Items Addressed This Visit      Cardiology Problems   Benign essential HTN    Other Visit Diagnoses    Chronic combined systolic and diastolic heart failure (HCC)    -  Primary   Relevant Orders   EKG 12-Lead   Ischemic cardiomyopathy       Relevant Orders   EKG 12-Lead   Coronary artery disease involving native coronary artery of native heart without angina pectoris       Stage 3 chronic kidney disease, unspecified whether stage 3a or 3b CKD       Labile hypertension       Hyperlipidemia LDL goal <70         Ischemic cardiomyopathy She reports stable blood pressure at home, no medication changes at this time Stable renal function, continue Entresto, carvedilol Potentially follow-up could consider adding spironolactone Reports that she is active Appears relatively euvolemic on today's visit Recommend she  moderate her fluid intake which she is doing  Chronic kidney disease stage III Renal function stable 3 months ago  CAD with stable angina Stent placed to her proximal to mid LAD Denies anginal symptoms On aspirin Plavix  Hyperlipidemia On Lipitor 40 daily  Disposition:   F/U  12 months   Total encounter time more than 25 minutes  Greater than 50% was spent in counseling and coordination of care with the patient    Signed, Esmond Plants, M.D., Ph.D. [place], [place]

## 2020-01-01 ENCOUNTER — Ambulatory Visit (INDEPENDENT_AMBULATORY_CARE_PROVIDER_SITE_OTHER): Payer: Medicare Other | Admitting: Cardiovascular Disease

## 2020-01-01 ENCOUNTER — Encounter: Payer: Self-pay | Admitting: Cardiovascular Disease

## 2020-01-01 ENCOUNTER — Other Ambulatory Visit: Payer: Self-pay

## 2020-01-01 VITALS — BP 140/62 | HR 70 | Ht 62.0 in | Wt 166.4 lb

## 2020-01-01 DIAGNOSIS — R0989 Other specified symptoms and signs involving the circulatory and respiratory systems: Secondary | ICD-10-CM

## 2020-01-01 DIAGNOSIS — I255 Ischemic cardiomyopathy: Secondary | ICD-10-CM

## 2020-01-01 DIAGNOSIS — I5042 Chronic combined systolic (congestive) and diastolic (congestive) heart failure: Secondary | ICD-10-CM

## 2020-01-01 DIAGNOSIS — I2510 Atherosclerotic heart disease of native coronary artery without angina pectoris: Secondary | ICD-10-CM

## 2020-01-01 DIAGNOSIS — E785 Hyperlipidemia, unspecified: Secondary | ICD-10-CM

## 2020-01-01 DIAGNOSIS — N183 Chronic kidney disease, stage 3 unspecified: Secondary | ICD-10-CM

## 2020-01-01 DIAGNOSIS — I10 Essential (primary) hypertension: Secondary | ICD-10-CM

## 2020-01-01 NOTE — Patient Instructions (Addendum)
Medication Instructions:  No changes  If you need a refill on your cardiac medications before your next appointment, please call your pharmacy.    Lab work: No new labs needed   If you have labs (blood work) drawn today and your tests are completely normal, you will receive your results only by: Marland Kitchen MyChart Message (if you have MyChart) OR . A paper copy in the mail If you have any lab test that is abnormal or we need to change your treatment, we will call you to review the results.   Testing/Procedures: No new testing needed   Follow-Up: At [facility], you and your health needs are our priority.  As part of our continuing mission to provide you with exceptional heart care, we have created designated Provider Care Teams.  These Care Teams include your primary Cardiologist (physician) and Advanced Practice Providers (APPs -  Physician Assistants and Nurse Practitioners) who all work together to provide you with the care you need, when you need it.  . You will need a follow up appointment in 3 months with Lashay Osborne or APP   . Providers on your designated Care Team:   . Murray Hodgkins, NP . Christell Faith, PA-C . Marrianne Mood, PA-C  Any Other Special Instructions Will Be Listed Below (If Applicable).  For educational health videos Log in to : [email or web address] Or : [email or web address], password : [email or web address]

## 2020-01-02 ENCOUNTER — Other Ambulatory Visit: Payer: Self-pay | Admitting: Family Medicine

## 2020-01-02 DIAGNOSIS — I10 Essential (primary) hypertension: Secondary | ICD-10-CM

## 2020-01-02 MED ORDER — CARVEDILOL 12.5 MG PO TABS: 12.5000 mg | ORAL_TABLET | Freq: Two times a day (BID) | ORAL | 5 refills | Status: DC

## 2020-01-02 NOTE — Telephone Encounter (Signed)
[facility] is requesting refills on Carvedilol 12.5 mg.

## 2020-01-14 ENCOUNTER — Telehealth: Payer: Self-pay | Admitting: Cardiovascular Disease

## 2020-01-14 NOTE — Telephone Encounter (Signed)
Patient wants to go over recent blood work and CKD II dx .  Please call tomorrow.

## 2020-01-17 NOTE — Telephone Encounter (Signed)
Spoke with patient and reviewed results and recommendations. She verbalized understanding and had no further questions at this time.

## 2020-02-05 ENCOUNTER — Other Ambulatory Visit: Payer: Self-pay | Admitting: Family Medicine

## 2020-02-05 NOTE — Telephone Encounter (Signed)
Requested Prescriptions  Pending Prescriptions Disp Refills  . pantoprazole (PROTONIX) 40 MG tablet [Pharmacy Med Name: Pantoprazole Sodium 40 MG Oral Tablet Delayed Release] 90 tablet 0    Sig: Take 1 tablet by mouth once daily     Gastroenterology: Proton Pump Inhibitors Passed - 02/05/2020  2:28 PM      Passed - Valid encounter within last 12 months    Recent Outpatient Visits          7 months ago Non-ST elevation (NSTEMI) myocardial infarction [facility])   [facility] Jerrol Banana., MD   9 months ago Iron deficiency anemia due to chronic blood loss   [facility] Jerrol Banana., MD   10 months ago Anemia, unspecified type   [facility] Jerrol Banana., MD   11 months ago Anemia, unspecified type   [facility] Jerrol Banana., MD   12 months ago Anemia, unspecified type   [facility] Jerrol Banana., MD      Future Appointments            In 2 months Gollan, Kathlene November, MD [facility], [facility]

## 2020-02-21 ENCOUNTER — Other Ambulatory Visit: Payer: Self-pay | Admitting: Family Medicine

## 2020-02-21 DIAGNOSIS — G4700 Insomnia, unspecified: Secondary | ICD-10-CM

## 2020-02-21 NOTE — Telephone Encounter (Signed)
Requested medication (s) are due for refill today: yes  Requested medication (s) are on the active medication list: yes  Last refill:  11/17/2019  Future visit scheduled:no  Notes to clinic: patient is due for follow up    Requested Prescriptions  Pending Prescriptions Disp Refills   amitriptyline (ELAVIL) 10 MG tablet [Pharmacy Med Name: Amitriptyline HCl 10 MG Oral Tablet] 180 tablet 0    Sig: TAKE 2 TABLETS BY MOUTH AT BEDTIME      Psychiatry:  Antidepressants - Heterocyclics (TCAs) Failed - 02/21/2020 11:01 AM      Failed - Completed PHQ-2 or PHQ-9 in the last 360 days.      Failed - Valid encounter within last 6 months    Recent Outpatient Visits           8 months ago Non-ST elevation (NSTEMI) myocardial infarction [facility])   [facility] Jerrol Banana., MD   9 months ago Iron deficiency anemia due to chronic blood loss   [facility] Jerrol Banana., MD   10 months ago Anemia, unspecified type   [facility] Jerrol Banana., MD   11 months ago Anemia, unspecified type   [facility] Jerrol Banana., MD   1 year ago Anemia, unspecified type   [facility] Jerrol Banana., MD       Future Appointments             In 1 month Gollan, Kathlene November, MD [facility], [place]

## 2020-03-12 ENCOUNTER — Ambulatory Visit: Payer: Self-pay | Admitting: *Deleted

## 2020-03-12 ENCOUNTER — Telehealth: Payer: Self-pay

## 2020-03-12 NOTE — Telephone Encounter (Signed)
Pt called in c/o lower abd pain with cramping and diarrhea that has been going on for 2 days.   "I have diverticulitis".   "Dr. Rosanna Randy knows all about it".   "I just want to see my doctor".   I let her know I just needed to make sure we were making the right appt for her is the reason I'm asking these questions.  The COVID-19 questionnaire indicates she needs a virtual visit due to the diarrhea.   "I don't use computers".   "I'm 86 years old"!  "I just want to see my doctor".   "I don't want to do phone calls or computers".  I called Dr. Alben Spittle office and spoke with [place].   She said she would talk with the pt.  I warm transferred the call to [facility].  I sent my notes to [facility].  Reason for Disposition . [1] MODERATE pain (e.g., interferes with normal activities) AND [2] pain comes and goes (cramps) AND [3] present > 24 hours  (Exception: pain with Vomiting or Diarrhea - see that Guideline)    Has diverticulitis  Answer Assessment - Initial Assessment Questions 1. LOCATION: "Where does it hurt?"      I'm having sharp pain below my navel and having diarrhea.   2. RADIATION: "Does the pain shoot anywhere else?" (e.g., chest, back)     No 3. ONSET: "When did the pain begin?" (e.g., minutes, hours or days ago)      2 days ago 4. SUDDEN: "Gradual or sudden onset?"     Gradually.   I have diverticulitis.   5. PATTERN "Does the pain come and go, or is it constant?"    - If constant: "Is it getting better, staying the same, or worsening?"      (Note: Constant means the pain never goes away completely; most serious pain is constant and it progresses)     - If intermittent: "How long does it last?" "Do you have pain now?"     (Note: Intermittent means the pain goes away completely between bouts)     Comes and goes 6. SEVERITY: "How bad is the pain?"  (e.g., Scale 1-10; mild, moderate, or severe)   - MILD (1-3): doesn't interfere with normal activities, abdomen soft and not  tender to touch    - MODERATE (4-7): interferes with normal activities or awakens from sleep, tender to touch    - SEVERE (8-10): excruciating pain, doubled over, unable to do any normal activities      7 on scale 7. RECURRENT SYMPTOM: "Have you ever had this type of abdominal pain before?" If so, ask: "When was the last time?" and "What happened that time?"      I have diverticulitis 8. CAUSE: "What do you think is causing the abdominal pain?"     See above. 9. RELIEVING/AGGRAVATING FACTORS: "What makes it better or worse?" (e.g., movement, antacids, bowel movement)     I took Pepto Bismal this morning didn't really help. 10. OTHER SYMPTOMS: "Has there been any vomiting, diarrhea, constipation, or urine problems?"       None 11. PREGNANCY: "Is there any chance you are pregnant?" "When was your last menstrual period?"       N/A  Protocols used: ABDOMINAL PAIN - [facility]

## 2020-03-12 NOTE — Telephone Encounter (Signed)
Scheduled pt with Stephanie Charles for tomorrow afternoon.

## 2020-03-12 NOTE — Telephone Encounter (Signed)
Patient has had increased bowel movements that are not watery.  She has history of diverticulitis and thinks that she is having a flare of it now.  Can not do video virtual and does not want phone visit per [facility].  Would like recommendation for what she should do. [place].

## 2020-03-12 NOTE — Telephone Encounter (Signed)
See previous message

## 2020-03-12 NOTE — Telephone Encounter (Addendum)
Per Dr. Rosanna Randy patient will need an appointment.

## 2020-03-13 ENCOUNTER — Other Ambulatory Visit: Payer: Self-pay

## 2020-03-13 ENCOUNTER — Encounter: Payer: Self-pay | Admitting: Physician Assistant

## 2020-03-13 ENCOUNTER — Ambulatory Visit (INDEPENDENT_AMBULATORY_CARE_PROVIDER_SITE_OTHER): Payer: Medicare Other | Admitting: Physician Assistant

## 2020-03-13 VITALS — BP 130/60 | HR 66 | Temp 96.8°F | Resp 18 | Wt 165.0 lb

## 2020-03-13 DIAGNOSIS — K5792 Diverticulitis of intestine, part unspecified, without perforation or abscess without bleeding: Secondary | ICD-10-CM

## 2020-03-13 DIAGNOSIS — R1032 Left lower quadrant pain: Secondary | ICD-10-CM | POA: Diagnosis not present

## 2020-03-13 DIAGNOSIS — E785 Hyperlipidemia, unspecified: Secondary | ICD-10-CM | POA: Diagnosis not present

## 2020-03-13 MED ORDER — METRONIDAZOLE 500 MG PO TABS: 500.0000 mg | ORAL_TABLET | Freq: Three times a day (TID) | ORAL | 0 refills | Status: AC

## 2020-03-13 MED ORDER — CIPROFLOXACIN HCL 500 MG PO TABS: 500.0000 mg | ORAL_TABLET | Freq: Two times a day (BID) | ORAL | 0 refills | Status: AC

## 2020-03-13 NOTE — Progress Notes (Signed)
I,Roshena L Chambers,acting as a scribe for Trinna Post, PA-C.,have documented all relevant documentation on the behalf of Trinna Post, PA-C,as directed by  Trinna Post, PA-C while in the presence of Trinna Post, PA-C.  Established patient visit   Patient: Stephanie Charles   DOB: 09-Mar-1930   86 y.o. Female  MRN: [id number] Visit Date: 03/13/2020  Today's healthcare provider: Trinna Post, PA-C   Chief Complaint  Patient presents with  . Abdominal Pain   Subjective    Abdominal Pain This is a new problem. Episode onset: 3 days ago. The problem has been unchanged. The pain is located in the LLQ. Quality: pressure. Pertinent negatives include no constipation, diarrhea, fever, nausea or vomiting. The pain is aggravated by bowel movement. The pain is relieved by nothing. She has tried nothing for the symptoms.  Her Past medical history is significant for: [facility]verticulitis [facility] at [facility]. Patient states it feels like she has to have a bowel movent an hour after eating.She reports having a single episode of constipation with rectal bleeding after having a bowel movement a few weeks ago.      Medications: Outpatient Medications Prior to Visit  Medication Sig  . acetaminophen (TYLENOL) 325 MG tablet Take 2 tablets (650 mg total) by mouth every 6 (six) hours as needed for mild pain or headache.  . albuterol (PROVENTIL) (2.5 MG/3ML) 0.083% nebulizer solution Take 3 mLs (2.5 mg total) by nebulization every 4 (four) hours as needed.  Marland Kitchen allopurinol (ZYLOPRIM) 300 MG tablet Take 1 tablet (300 mg total) by mouth daily.  Marland Kitchen amitriptyline (ELAVIL) 10 MG tablet TAKE 2 TABLETS BY MOUTH AT BEDTIME  . aspirin EC 81 MG EC tablet Take 1 tablet (81 mg total) by mouth daily.  Marland Kitchen atorvastatin (LIPITOR) 40 MG tablet Take 1 tablet (40 mg total) by mouth daily.  . carvedilol (COREG) 12.5 MG tablet Take 1 tablet (12.5 mg total) by mouth 2 (two) times daily.  . clopidogrel (PLAVIX) 75 MG tablet  Take 1 tablet by mouth once daily (Patient taking differently: Take 75 mg by mouth daily. )  . fluticasone (FLONASE) 50 MCG/ACT nasal spray Use 2 spray(s) in each nostril once daily  . hydrochlorothiazide (HYDRODIURIL) 25 MG tablet Take 1 tablet (25 mg total) by mouth daily.  Marland Kitchen levothyroxine (SYNTHROID) 137 MCG tablet Take 1 tablet by mouth once daily (Patient taking differently: Take 137 mcg by mouth daily. )  . mirtazapine (REMERON) 30 MG tablet TAKE 1 TABLET BY MOUTH AT BEDTIME  . mometasone (ELOCON) 0.1 % cream Apply 1 application topically daily.  . Multiple Vitamin (MULTIVITAMIN) capsule Take 1 capsule by mouth daily.  . pantoprazole (PROTONIX) 40 MG tablet Take 1 tablet by mouth once daily  . sacubitril-valsartan (ENTRESTO) 49-51 MG Take 1 tablet by mouth 2 (two) times daily.  . sertraline (ZOLOFT) 25 MG tablet Take 1 tablet by mouth once daily (Patient taking differently: Take 25 mg by mouth daily. )  . nitroGLYCERIN (NITROSTAT) 0.4 MG SL tablet Place 1 tablet (0.4 mg total) under the tongue every 5 (five) minutes as needed for chest pain.   No facility-administered medications prior to visit.    Review of Systems  Constitutional: Negative for appetite change, chills, fatigue and fever.  Respiratory: Negative for chest tightness and shortness of breath.   Cardiovascular: Negative for chest pain and palpitations.  Gastrointestinal: Positive for abdominal pain. Negative for constipation, diarrhea, nausea and vomiting.  Neurological: Negative for dizziness and  weakness.      Objective    BP 130/60 (BP Location: Left Arm, Patient Position: Sitting, Cuff Size: Normal)   Pulse 66   Temp (!) 96.8 F (36 C) (Temporal)   Resp 18   Wt 165 lb (74.8 kg)   SpO2 97% Comment: room air  BMI 30.18 kg/m    Physical Exam Constitutional:      Appearance: Normal appearance.  Cardiovascular:     Rate and Rhythm: Normal rate and regular rhythm.     Heart sounds: Normal heart sounds.   Pulmonary:     Effort: Pulmonary effort is normal.     Breath sounds: Normal breath sounds.  Abdominal:     General: Bowel sounds are normal.     Palpations: Abdomen is soft.     Tenderness: There is abdominal tenderness in the left lower quadrant.  Skin:    General: Skin is warm and dry.  Neurological:     Mental Status: She is alert and oriented to person, place, and time. Mental status is at baseline.  Psychiatric:        Mood and Affect: Mood normal.        Behavior: Behavior normal.        No results found for any visits on 03/13/20.  Assessment & Plan      1. LLQ abdominal pain New. Has a past history of Diverticulitis. Will order labs and treat as stated below. Patient advised to go to ER if new symptoms develop or worsen.  - CBC - C-reactive protein - Comprehensive Metabolic Panel (CMET)  2. Diverticulitis - metroNIDAZOLE (FLAGYL) 500 MG tablet; Take 1 tablet (500 mg total) by mouth 3 (three) times daily for 7 days.  Dispense: 21 tablet; Refill: 0 - ciprofloxacin (CIPRO) 500 MG tablet; Take 1 tablet (500 mg total) by mouth 2 (two) times daily for 7 days.  Dispense: 14 tablet; Refill: 0  3. Hyperlipidemia, unspecified hyperlipidemia type Tolerating Atorvastatin well. She request to have cholesterol levels rechecked today.  - Lipid panel    Return if symptoms worsen or fail to improve.      ITrinna Post, PA-C, have reviewed all documentation for this visit. The documentation on 03/13/20 for the exam, diagnosis, procedures, and orders are all accurate and complete.    Paulene Floor  [facility] [phone] (phone) [phone] (fax)  [place]

## 2020-03-14 ENCOUNTER — Telehealth: Payer: Self-pay

## 2020-03-14 LAB — COMPREHENSIVE METABOLIC PANEL
ALT: 14 IU/L (ref 0–32)
AST: 17 IU/L (ref 0–40)
Albumin/Globulin Ratio: 1.9 (ref 1.2–2.2)
Albumin: 3.9 g/dL (ref 3.6–4.6)
Alkaline Phosphatase: 130 IU/L — ABNORMAL HIGH (ref 48–121)
BUN/Creatinine Ratio: 19 (ref 12–28)
BUN: 24 mg/dL (ref 8–27)
Bilirubin Total: 0.3 mg/dL (ref 0.0–1.2)
CO2: 23 mmol/L (ref 20–29)
Calcium: 8.9 mg/dL (ref 8.7–10.3)
Chloride: 100 mmol/L (ref 96–106)
Creatinine, Ser: 1.29 mg/dL — ABNORMAL HIGH (ref 0.57–1.00)
GFR calc Af Amer: 43 mL/min/{1.73_m2} — ABNORMAL LOW (ref >59)
GFR calc non Af Amer: 37 mL/min/{1.73_m2} — ABNORMAL LOW (ref >59)
Globulin, Total: 2.1 g/dL (ref 1.5–4.5)
Glucose: 143 mg/dL — ABNORMAL HIGH (ref 65–99)
Potassium: 4.1 mmol/L (ref 3.5–5.2)
Sodium: 138 mmol/L (ref 134–144)
Total Protein: 6 g/dL (ref 6.0–8.5)

## 2020-03-14 LAB — CBC
Hematocrit: 30.5 % — ABNORMAL LOW (ref 34.0–46.6)
Hemoglobin: 10 g/dL — ABNORMAL LOW (ref 11.1–15.9)
MCH: 30.3 pg (ref 26.6–33.0)
MCHC: 32.8 g/dL (ref 31.5–35.7)
MCV: 92 fL (ref 79–97)
Platelets: 184 10*3/uL (ref 150–450)
RBC: 3.3 x10E6/uL — ABNORMAL LOW (ref 3.77–5.28)
RDW: 13.6 % (ref 11.7–15.4)
WBC: 8.3 10*3/uL (ref 3.4–10.8)

## 2020-03-14 LAB — C-REACTIVE PROTEIN: CRP: 3 mg/L (ref 0–10)

## 2020-03-14 LAB — LIPID PANEL
Chol/HDL Ratio: 4 ratio (ref 0.0–4.4)
Cholesterol, Total: 115 mg/dL (ref 100–199)
HDL: 29 mg/dL — ABNORMAL LOW (ref >39)
LDL Chol Calc (NIH): 53 mg/dL (ref 0–99)
Triglycerides: 197 mg/dL — ABNORMAL HIGH (ref 0–149)
VLDL Cholesterol Cal: 33 mg/dL (ref 5–40)

## 2020-03-14 NOTE — Telephone Encounter (Signed)
Status: Final result       Visible to patient: Yes (seen)

## 2020-03-14 NOTE — Telephone Encounter (Signed)
-----   Message from Trinna Post, [place] sent at 03/14/2020  8:28 AM EDT ----- Please let patient know her labs are stable. Inflammatory markers negative. Her hemoglobin is slightly low, recommend rechecking with PCP at next visit.

## 2020-03-15 ENCOUNTER — Other Ambulatory Visit: Payer: Self-pay | Admitting: Family Medicine

## 2020-03-15 DIAGNOSIS — Z96653 Presence of artificial knee joint, bilateral: Secondary | ICD-10-CM | POA: Diagnosis not present

## 2020-03-15 DIAGNOSIS — Z885 Allergy status to narcotic agent status: Secondary | ICD-10-CM | POA: Diagnosis not present

## 2020-03-15 DIAGNOSIS — I10 Essential (primary) hypertension: Secondary | ICD-10-CM | POA: Diagnosis not present

## 2020-03-15 DIAGNOSIS — R112 Nausea with vomiting, unspecified: Secondary | ICD-10-CM | POA: Diagnosis not present

## 2020-03-15 DIAGNOSIS — F3289 Other specified depressive episodes: Secondary | ICD-10-CM

## 2020-03-15 DIAGNOSIS — I252 Old myocardial infarction: Secondary | ICD-10-CM | POA: Diagnosis not present

## 2020-03-15 DIAGNOSIS — R079 Chest pain, unspecified: Secondary | ICD-10-CM | POA: Diagnosis not present

## 2020-03-15 DIAGNOSIS — Z7982 Long term (current) use of aspirin: Secondary | ICD-10-CM | POA: Diagnosis not present

## 2020-03-15 DIAGNOSIS — R1032 Left lower quadrant pain: Secondary | ICD-10-CM | POA: Diagnosis not present

## 2020-03-15 DIAGNOSIS — K5792 Diverticulitis of intestine, part unspecified, without perforation or abscess without bleeding: Secondary | ICD-10-CM | POA: Diagnosis not present

## 2020-03-15 DIAGNOSIS — I2510 Atherosclerotic heart disease of native coronary artery without angina pectoris: Secondary | ICD-10-CM | POA: Diagnosis not present

## 2020-03-15 DIAGNOSIS — E039 Hypothyroidism, unspecified: Secondary | ICD-10-CM | POA: Diagnosis not present

## 2020-03-15 DIAGNOSIS — Z8673 Personal history of transient ischemic attack (TIA), and cerebral infarction without residual deficits: Secondary | ICD-10-CM | POA: Diagnosis not present

## 2020-03-15 NOTE — Telephone Encounter (Signed)
Requested Prescriptions  Pending Prescriptions Disp Refills  . sertraline (ZOLOFT) 25 MG tablet [Pharmacy Med Name: Sertraline HCl 25 MG Oral Tablet] 90 tablet 1    Sig: Take 1 tablet by mouth once daily     Psychiatry:  Antidepressants - SSRI Failed - 03/15/2020 11:05 AM      Failed - Completed PHQ-2 or PHQ-9 in the last 360 days.      Passed - Valid encounter within last 6 months    Recent Outpatient Visits          2 days ago LLQ abdominal pain   [place] [place], [place]   9 months ago Non-ST elevation (NSTEMI) myocardial infarction [facility])   [facility] Jerrol Banana., MD   10 months ago Iron deficiency anemia due to chronic blood loss   [facility] Jerrol Banana., MD   11 months ago Anemia, unspecified type   [facility] Jerrol Banana., MD   1 year ago Anemia, unspecified type   [facility] Jerrol Banana., MD      Future Appointments            In 3 weeks [place], Kathlene November, MD [facility], [facility]

## 2020-03-16 DIAGNOSIS — R1032 Left lower quadrant pain: Secondary | ICD-10-CM | POA: Diagnosis not present

## 2020-03-16 DIAGNOSIS — K5792 Diverticulitis of intestine, part unspecified, without perforation or abscess without bleeding: Secondary | ICD-10-CM | POA: Diagnosis not present

## 2020-03-16 DIAGNOSIS — R079 Chest pain, unspecified: Secondary | ICD-10-CM | POA: Diagnosis not present

## 2020-03-18 ENCOUNTER — Telehealth: Payer: Self-pay

## 2020-03-18 NOTE — Telephone Encounter (Signed)
If she is not having a right sided pelvic pain then she can see me at the last couple of days of June and we can make a decision then if she wants to see GYN or not.  If no right lower quadrant pain then she can just see me and we can make a decision together about GYN for her

## 2020-03-18 NOTE — Telephone Encounter (Signed)
Patient was advised of appointment. Patient declined appointment.

## 2020-03-18 NOTE — Telephone Encounter (Signed)
Copied from [place] [phone]. Topic: General - Other >> Mar 18, 2020  2:39 PM Greggory Keen D wrote: Pt called saying she was in the ER at [facility] Saturday and Sunday for abd pain.  She said they found other things and that they referred her [facility] but she wants to talk to Dr. Rosanna Randy before she goes to see the obgyn.   There were no appts left for Dr. Darnell Level until the end of June but they wanted her to FU asap.  Pts  [phone]

## 2020-04-07 NOTE — Progress Notes (Deleted)
Cardiology Office Note  Date:  04/07/2020   ID:  [name], DOB 1930/07/26, MRN [id number]  PCP:  Jerrol Banana., MD   No chief complaint on file.   HPI:  [name] is a 86 y.o. female with history of  demand ischemia with hypertensive urgency,  LBBB,  TIA/CVA in 2016,  CKD stage II,  HTN,  HLD,  carotid artery disease,  hypothyroidism,  anxiety, depression,  Coronary disease, stent placed to proximal to mid LAD Who presents for follow-up of her ischemic cardiomyopathy  Seen in the hospital September 2020, ejection fraction 35 to 40% She had symptoms of chest pain and accelerated HTN found to have elevated high sensitivity troponin peaking at 128 and acute systolic CHF.    cardiac catheterization, with severe proximal to mid LAD disease, stent placed  Follow-up echocardiogram December 2020 ejection fraction 40 up to 45% Tolerating Entresto, carvedilol, HCTZ Reports blood pressure stable at home  Reports that she is walking at home, doing some exercises  Lab work reviewed, stable creatinine 1.12 in January 2021  EKG personally reviewed by myself on todays visit Shows normal sinus rhythm first-degree AV block left bundle branch block  PMH:   has a past medical history of Anemia, Diverticulitis, GERD (gastroesophageal reflux disease), Hyperlipidemia, Hypertension, Hypothyroidism, Myocardial infarction ([place]) (08/2017), TIA (transient ischemic attack), Vertigo, and [name].  PSH:    Past Surgical History:  Procedure Laterality Date  . COLONOSCOPY WITH PROPOFOL N/A 06/22/2018   Procedure: COLONOSCOPY WITH PROPOFOL;  Surgeon: Jonathon Bellows, MD;  Location: [facility] ENDOSCOPY;  Service: Endoscopy;  Laterality: N/A;  . CORONARY STENT INTERVENTION N/A 06/07/2019   Procedure: CORONARY STENT INTERVENTION;  Surgeon: [name], MD;  Location: [facility] CV LAB;  Service: Cardiovascular;  Laterality: N/A;  . ESOPHAGOGASTRODUODENOSCOPY (EGD)  WITH PROPOFOL N/A 06/22/2018   Procedure: ESOPHAGOGASTRODUODENOSCOPY (EGD) WITH PROPOFOL;  Surgeon: Jonathon Bellows, MD;  Location: [facility] ENDOSCOPY;  Service: Endoscopy;  Laterality: N/A;  . KNEE ARTHROSCOPY Right   . REPLACEMENT TOTAL KNEE BILATERAL    . RIGHT/LEFT HEART CATH AND CORONARY ANGIOGRAPHY N/A 06/07/2019   Procedure: RIGHT/LEFT HEART CATH AND CORONARY ANGIOGRAPHY;  Surgeon: [name], MD;  Location: [facility] LAB;  Service: Cardiovascular;  Laterality: N/A;  . SHOULDER SURGERY Left     Current Outpatient Medications  Medication Sig Dispense Refill  . acetaminophen (TYLENOL) 325 MG tablet Take 2 tablets (650 mg total) by mouth every 6 (six) hours as needed for mild pain or headache.    . albuterol (PROVENTIL) (2.5 MG/3ML) 0.083% nebulizer solution Take 3 mLs (2.5 mg total) by nebulization every 4 (four) hours as needed. 75 mL 3  . allopurinol (ZYLOPRIM) 300 MG tablet Take 1 tablet (300 mg total) by mouth daily. 90 tablet 3  . amitriptyline (ELAVIL) 10 MG tablet TAKE 2 TABLETS BY MOUTH AT BEDTIME 180 tablet 0  . aspirin EC 81 MG EC tablet Take 1 tablet (81 mg total) by mouth daily.    Marland Kitchen atorvastatin (LIPITOR) 40 MG tablet Take 1 tablet (40 mg total) by mouth daily. 90 tablet 3  . carvedilol (COREG) 12.5 MG tablet Take 1 tablet (12.5 mg total) by mouth 2 (two) times daily. 60 tablet 5  . clopidogrel (PLAVIX) 75 MG tablet Take 1 tablet by mouth once daily (Patient taking differently: Take 75 mg by mouth daily. ) 90 tablet 3  . fluticasone (FLONASE) 50 MCG/ACT nasal spray Use 2 spray(s) in  each nostril once daily 48 g 3  . hydrochlorothiazide (HYDRODIURIL) 25 MG tablet Take 1 tablet (25 mg total) by mouth daily. 90 tablet 3  . levothyroxine (SYNTHROID) 137 MCG tablet Take 1 tablet by mouth once daily (Patient taking differently: Take 137 mcg by mouth daily. ) 90 tablet 3  . mirtazapine (REMERON) 30 MG tablet TAKE 1 TABLET BY MOUTH AT BEDTIME 90 tablet 3  . mometasone (ELOCON) 0.1  % cream Apply 1 application topically daily. 45 g 0  . Multiple Vitamin (MULTIVITAMIN) capsule Take 1 capsule by mouth daily.    . nitroGLYCERIN (NITROSTAT) 0.4 MG SL tablet Place 1 tablet (0.4 mg total) under the tongue every 5 (five) minutes as needed for chest pain. 25 tablet 3  . pantoprazole (PROTONIX) 40 MG tablet Take 1 tablet by mouth once daily 90 tablet 0  . sacubitril-valsartan (ENTRESTO) 49-51 MG Take 1 tablet by mouth 2 (two) times daily. 60 tablet 5  . sertraline (ZOLOFT) 25 MG tablet Take 1 tablet by mouth once daily 90 tablet 1   No current facility-administered medications for this visit.     Allergies:   Latex, Levofloxacin, Lisinopril, Povidone-iodine, Simvastatin, and Codeine   Social History:  The patient  reports that she has never smoked. She has never used smokeless tobacco. She reports that she does not drink alcohol and does not use drugs.   Family History:   family history includes Alzheimer's disease in her brother and brother; Breast cancer in her sister; Cancer in her brother; Clotting disorder in her daughter; Fibromyalgia in her daughter; Heart attack in her brother; Heart disease in her brother, brother, father, and mother; Hypertension in her mother; Mental illness in her brother; Stroke in her brother and mother.    Review of Systems: Review of Systems  Constitutional: Negative.   HENT: Negative.   Respiratory: Negative.   Cardiovascular: Negative.   Gastrointestinal: Negative.   Musculoskeletal: Negative.   Neurological: Negative.   Psychiatric/Behavioral: Negative.   All other systems reviewed and are negative.   PHYSICAL EXAM: VS:  There were no vitals taken for this visit. , BMI There is no height or weight on file to calculate BMI. GEN: Well nourished, well developed, in no acute distress HEENT: normal Neck: no JVD, carotid bruits, or masses Cardiac: RRR; no murmurs, rubs, or gallops,no edema  Respiratory:  clear to auscultation  bilaterally, normal work of breathing GI: soft, nontender, nondistended, + BS MS: no deformity or atrophy Skin: warm and dry, no rash Neuro:  Strength and sensation are intact Psych: euthymic mood, full affect   Recent Labs: 06/06/2019: B Natriuretic Peptide 246.0; TSH 0.432 06/21/2019: Magnesium 1.8 03/13/2020: ALT 14; BUN 24; Creatinine, Ser 1.29; Hemoglobin 10.0; Platelets 184; Potassium 4.1; Sodium 138    Lipid Panel Lab Results  Component Value Date   CHOL 115 03/13/2020   HDL 29 (L) 03/13/2020   LDLCALC 53 03/13/2020   TRIG 197 (H) 03/13/2020      Wt Readings from Last 3 Encounters:  03/13/20 165 lb (74.8 kg)  01/01/20 166 lb 6 oz (75.5 kg)  10/03/19 166 lb 4 oz (75.4 kg)     ASSESSMENT AND PLAN:  Problem List Items Addressed This Visit    None     Ischemic cardiomyopathy She reports stable blood pressure at home, no medication changes at this time Stable renal function, continue Entresto, carvedilol Potentially follow-up could consider adding spironolactone Reports that she is active Appears relatively euvolemic on today's visit Recommend  she moderate her fluid intake which she is doing  Chronic kidney disease stage III Renal function stable 3 months ago  CAD with stable angina Stent placed to her proximal to mid LAD Denies anginal symptoms On aspirin Plavix  Hyperlipidemia On Lipitor 40 daily  Disposition:   F/U  12 months   Total encounter time more than 25 minutes  Greater than 50% was spent in counseling and coordination of care with the patient    Signed, Esmond Plants, M.D., Ph.D. [place], [place]

## 2020-04-08 ENCOUNTER — Ambulatory Visit: Payer: Medicare Other | Admitting: Cardiovascular Disease

## 2020-04-09 DIAGNOSIS — L821 Other seborrheic keratosis: Secondary | ICD-10-CM | POA: Diagnosis not present

## 2020-04-09 DIAGNOSIS — L570 Actinic keratosis: Secondary | ICD-10-CM | POA: Diagnosis not present

## 2020-04-28 DIAGNOSIS — H2513 Age-related nuclear cataract, bilateral: Secondary | ICD-10-CM | POA: Diagnosis not present

## 2020-05-16 ENCOUNTER — Other Ambulatory Visit: Payer: Self-pay | Admitting: Family Medicine

## 2020-05-16 DIAGNOSIS — G4700 Insomnia, unspecified: Secondary | ICD-10-CM

## 2020-05-16 NOTE — Telephone Encounter (Signed)
Requested Prescriptions  Pending Prescriptions Disp Refills  . levothyroxine (SYNTHROID) 137 MCG tablet [Pharmacy Med Name: Levothyroxine Sodium 137 MCG Oral Tablet] 90 tablet 0    Sig: Take 1 tablet by mouth once daily     Endocrinology:  Hypothyroid Agents Failed - 05/16/2020 11:42 AM      Failed - TSH needs to be rechecked within 3 months after an abnormal result. Refill until TSH is due.      Passed - TSH in normal range and within 360 days    TSH  Date Value Ref Range Status  06/06/2019 0.432 0.350 - 4.500 uIU/mL Final    Comment:    Performed by a 3rd Generation assay with a functional sensitivity of <=0.01 uIU/mL. Performed at [facility], [address] [place], [place] [address]   03/16/2018 1.630 0.450 - 4.500 uIU/mL Final         Passed - Valid encounter within last 12 months    Recent Outpatient Visits          2 months ago LLQ abdominal pain   [place], [place], [place]   11 months ago Non-ST elevation (NSTEMI) myocardial infarction [facility])   [facility] Jerrol Banana., MD   1 year ago Iron deficiency anemia due to chronic blood loss   [facility] Jerrol Banana., MD   1 year ago Anemia, unspecified type   [facility] Jerrol Banana., MD   1 year ago Anemia, unspecified type   [facility] Jerrol Banana., MD      Future Appointments            In 3 days Jerrol Banana., MD [facility], [place]   In 1 month Gollan, Kathlene November, MD [facility], [facility]           . amitriptyline (ELAVIL) 10 MG tablet [Pharmacy Med Name: Amitriptyline HCl 10 MG Oral Tablet] 180 tablet 0    Sig: TAKE 2 TABLETS BY MOUTH AT BEDTIME     Psychiatry:  Antidepressants - Heterocyclics (TCAs) Failed - 05/16/2020 11:42 AM      Failed - Completed PHQ-2 or PHQ-9 in the last 360 days.      Passed - Valid encounter within last 6  months    Recent Outpatient Visits          2 months ago LLQ abdominal pain   [place], [place], [place]   11 months ago Non-ST elevation (NSTEMI) myocardial infarction [facility])   [facility] Jerrol Banana., MD   1 year ago Iron deficiency anemia due to chronic blood loss   [facility] Jerrol Banana., MD   1 year ago Anemia, unspecified type   [facility] Jerrol Banana., MD   1 year ago Anemia, unspecified type   [facility] Jerrol Banana., MD      Future Appointments            In 3 days Jerrol Banana., MD [facility] [place], [place]   In 1 month Gollan, Kathlene November, MD [facility], [facility]            Phone call to pt.  Advised of need for f/u appt. To continue to get medication refills.  Appt. Scheduled 05/19/20 @ 3:00 PM.  Pt. Agreed with plan.  Refills given per protocol.

## 2020-05-19 ENCOUNTER — Other Ambulatory Visit: Payer: Self-pay

## 2020-05-19 ENCOUNTER — Ambulatory Visit (INDEPENDENT_AMBULATORY_CARE_PROVIDER_SITE_OTHER): Payer: Medicare Other | Admitting: Family Medicine

## 2020-05-19 ENCOUNTER — Encounter: Payer: Self-pay | Admitting: Family Medicine

## 2020-05-19 VITALS — BP 131/62 | HR 76 | Temp 98.3°F | Ht 62.0 in | Wt 162.4 lb

## 2020-05-19 DIAGNOSIS — I10 Essential (primary) hypertension: Secondary | ICD-10-CM | POA: Diagnosis not present

## 2020-05-19 DIAGNOSIS — D500 Iron deficiency anemia secondary to blood loss (chronic): Secondary | ICD-10-CM | POA: Diagnosis not present

## 2020-05-19 DIAGNOSIS — I6529 Occlusion and stenosis of unspecified carotid artery: Secondary | ICD-10-CM | POA: Diagnosis not present

## 2020-05-19 DIAGNOSIS — M109 Gout, unspecified: Secondary | ICD-10-CM

## 2020-05-19 DIAGNOSIS — I5042 Chronic combined systolic (congestive) and diastolic (congestive) heart failure: Secondary | ICD-10-CM

## 2020-05-19 DIAGNOSIS — E785 Hyperlipidemia, unspecified: Secondary | ICD-10-CM

## 2020-05-19 DIAGNOSIS — K219 Gastro-esophageal reflux disease without esophagitis: Secondary | ICD-10-CM

## 2020-05-19 DIAGNOSIS — F3341 Major depressive disorder, recurrent, in partial remission: Secondary | ICD-10-CM

## 2020-05-19 DIAGNOSIS — E039 Hypothyroidism, unspecified: Secondary | ICD-10-CM | POA: Diagnosis not present

## 2020-05-19 NOTE — Progress Notes (Signed)
Established patient visit   Patient: Stephanie Charles   DOB: 08-21-1930   86 y.o. Female  MRN: [id number] Visit Date: 05/19/2020  Today's healthcare provider: [name], MD   Chief Complaint  Patient presents with  . Anemia  . Hypertension   [employer] as a scribe for [name], MD.,have documented all relevant documentation on the behalf of [name], MD,as directed by  [name], MD while in the presence of [name], MD.  Subjective    HPI  Patient actually feels well.  She has felt not felt this well  in several years.  She has no complaints today.  She is tolerating her medication well. Hypertension, follow-up  BP Readings from Last 3 Encounters:  05/19/20 131/62  03/13/20 130/60  01/01/20 140/62   Wt Readings from Last 3 Encounters:  05/19/20 162 lb 6.4 oz (73.7 kg)  03/13/20 165 lb (74.8 kg)  01/01/20 166 lb 6 oz (75.5 kg)     She was last seen for hypertension 06/12/2019.  BP at that visit was 142/72. Management since that visit includes; Controlled. She reports good compliance with treatment. She is not having side effects.  She is not exercising. She is adherent to low salt diet.   Outside blood pressures are being checked at home.  She does not smoke.  Use of agents associated with hypertension: thyroid hormones.   --------------------------------------------------------------------  Iron deficiency anemia due to chronic blood loss From 06/12/2019-Stop iron ,check CBC.      Medications: Outpatient Medications Prior to Visit  Medication Sig  . acetaminophen (TYLENOL) 325 MG tablet Take 2 tablets (650 mg total) by mouth every 6 (six) hours as needed for mild pain or headache.  . allopurinol (ZYLOPRIM) 300 MG tablet Take 1 tablet (300 mg total) by mouth daily.  Marland Kitchen amitriptyline (ELAVIL) 10 MG tablet TAKE 2 TABLETS BY MOUTH AT BEDTIME  . aspirin EC 81 MG EC tablet Take 1 tablet (81 mg total)  by mouth daily.  Marland Kitchen atorvastatin (LIPITOR) 40 MG tablet Take 1 tablet (40 mg total) by mouth daily.  . carvedilol (COREG) 12.5 MG tablet Take 1 tablet (12.5 mg total) by mouth 2 (two) times daily.  . clopidogrel (PLAVIX) 75 MG tablet Take 1 tablet by mouth once daily (Patient taking differently: Take 75 mg by mouth daily. )  . fluticasone (FLONASE) 50 MCG/ACT nasal spray Use 2 spray(s) in each nostril once daily  . hydrochlorothiazide (HYDRODIURIL) 25 MG tablet Take 1 tablet (25 mg total) by mouth daily.  Marland Kitchen levothyroxine (SYNTHROID) 137 MCG tablet Take 1 tablet by mouth once daily  . mirtazapine (REMERON) 30 MG tablet TAKE 1 TABLET BY MOUTH AT BEDTIME  . mometasone (ELOCON) 0.1 % cream Apply 1 application topically daily.  . Multiple Vitamin (MULTIVITAMIN) capsule Take 1 capsule by mouth daily.  . pantoprazole (PROTONIX) 40 MG tablet Take 1 tablet by mouth once daily  . sacubitril-valsartan (ENTRESTO) 49-51 MG Take 1 tablet by mouth 2 (two) times daily.  . sertraline (ZOLOFT) 25 MG tablet Take 1 tablet by mouth once daily  . albuterol (PROVENTIL) (2.5 MG/3ML) 0.083% nebulizer solution Take 3 mLs (2.5 mg total) by nebulization every 4 (four) hours as needed.  . nitroGLYCERIN (NITROSTAT) 0.4 MG SL tablet Place 1 tablet (0.4 mg total) under the tongue every 5 (five) minutes as needed for chest pain.   No facility-administered medications prior to visit.    Review of Systems  Constitutional: Negative for appetite change, chills, fatigue and fever.  Respiratory: Negative for chest tightness and shortness of breath.   Cardiovascular: Negative for chest pain and palpitations.  Gastrointestinal: Negative for abdominal pain, nausea and vomiting.  Neurological: Negative for dizziness and weakness.       Objective    BP 131/62 (BP Location: Right Arm, Patient Position: Sitting, Cuff Size: Normal)   Pulse 76   Temp 98.3 F (36.8 C) (Oral)   Ht 5\' 2"  (1.575 m)   Wt 162 lb 6.4 oz (73.7 kg)   BMI  29.70 kg/m  BP Readings from Last 3 Encounters:  05/19/20 131/62  03/13/20 130/60  01/01/20 140/62   Wt Readings from Last 3 Encounters:  05/19/20 162 lb 6.4 oz (73.7 kg)  03/13/20 165 lb (74.8 kg)  01/01/20 166 lb 6 oz (75.5 kg)      Physical Exam Vitals reviewed.  Constitutional:      Appearance: Normal appearance.  HENT:     Right Ear: External ear normal.     Left Ear: External ear normal.     Nose: Nose normal.  Eyes:     General: No scleral icterus.    Conjunctiva/sclera: Conjunctivae normal.  Cardiovascular:     Rate and Rhythm: Normal rate and regular rhythm.     Heart sounds: Normal heart sounds.  Pulmonary:     Effort: Pulmonary effort is normal.     Breath sounds: Normal breath sounds.  Abdominal:     Palpations: Abdomen is soft.     Tenderness: There is no abdominal tenderness.  Lymphadenopathy:     Cervical: No cervical adenopathy.  Skin:    General: Skin is warm and dry.  Neurological:     General: No focal deficit present.     Mental Status: She is alert and oriented to person, place, and time.  Psychiatric:        Mood and Affect: Mood normal.        Behavior: Behavior normal.        Thought Content: Thought content normal.        Judgment: Judgment normal.      General: Appearance:     Overweight female in no acute distress  Eyes:    PERRL, conjunctiva/corneas clear, EOM's intact       Lungs:     Clear to auscultation bilaterally, respirations unlabored  Heart:    Normal heart rate. Normal rhythm. No murmurs, rubs, or gallops.   MS:   All extremities are intact.   Neurologic:   Awake, alert, oriented x 3. No apparent focal neurological           defect.         No results found for any visits on 05/19/20.  Assessment & Plan     1. Benign essential HTN Good control - CBC with Differential - Comprehensive Metabolic Panel (CMET)  2. Iron deficiency anemia due to chronic blood loss Clinically stable 3.  Follow CBC  3. Adult  hypothyroidism On levothyroxine - TSH  4. Gout, unspecified cause, unspecified chronicity, unspecified site On allopurinol. - Uric acid  5. Stenosis of carotid artery, unspecified laterality On Plavix  6. Gastroesophageal reflux disease, unspecified whether esophagitis present On pantoprazole  7. Recurrent major depressive disorder, in partial remission ([place]) On mirtazapine  8. Hyperlipidemia, unspecified hyperlipidemia type On atorvastatin 40  9. Chronic combined systolic and diastolic CHF (congestive heart failure) ([place]) On Entresto   Return in about 3 months (around  08/19/2020).         Asaf Elmquist Cranford Mon, MD  [facility] [phone] (phone) [phone] (fax)  [place]

## 2020-05-20 LAB — TSH: TSH: 1.11 u[IU]/mL (ref 0.450–4.500)

## 2020-05-20 LAB — COMPREHENSIVE METABOLIC PANEL
ALT: 12 IU/L (ref 0–32)
AST: 15 IU/L (ref 0–40)
Albumin/Globulin Ratio: 2 (ref 1.2–2.2)
Albumin: 4.1 g/dL (ref 3.6–4.6)
Alkaline Phosphatase: 133 IU/L — ABNORMAL HIGH (ref 48–121)
BUN/Creatinine Ratio: 18 (ref 12–28)
BUN: 26 mg/dL (ref 8–27)
Bilirubin Total: 0.5 mg/dL (ref 0.0–1.2)
CO2: 25 mmol/L (ref 20–29)
Calcium: 9.2 mg/dL (ref 8.7–10.3)
Chloride: 100 mmol/L (ref 96–106)
Creatinine, Ser: 1.48 mg/dL — ABNORMAL HIGH (ref 0.57–1.00)
GFR calc Af Amer: 36 mL/min/{1.73_m2} — ABNORMAL LOW (ref >59)
GFR calc non Af Amer: 32 mL/min/{1.73_m2} — ABNORMAL LOW (ref >59)
Globulin, Total: 2.1 g/dL (ref 1.5–4.5)
Glucose: 108 mg/dL — ABNORMAL HIGH (ref 65–99)
Potassium: 4.5 mmol/L (ref 3.5–5.2)
Sodium: 140 mmol/L (ref 134–144)
Total Protein: 6.2 g/dL (ref 6.0–8.5)

## 2020-05-20 LAB — CBC WITH DIFFERENTIAL/PLATELET
Basophils Absolute: 0.1 10*3/uL (ref 0.0–0.2)
Basos: 1 %
EOS (ABSOLUTE): 0.2 10*3/uL (ref 0.0–0.4)
Eos: 3 %
Hematocrit: 32.6 % — ABNORMAL LOW (ref 34.0–46.6)
Hemoglobin: 10.7 g/dL — ABNORMAL LOW (ref 11.1–15.9)
Immature Grans (Abs): 0 10*3/uL (ref 0.0–0.1)
Immature Granulocytes: 0 %
Lymphocytes Absolute: 1.6 10*3/uL (ref 0.7–3.1)
Lymphs: 20 %
MCH: 30.5 pg (ref 26.6–33.0)
MCHC: 32.8 g/dL (ref 31.5–35.7)
MCV: 93 fL (ref 79–97)
Monocytes Absolute: 0.4 10*3/uL (ref 0.1–0.9)
Monocytes: 5 %
Neutrophils Absolute: 5.9 10*3/uL (ref 1.4–7.0)
Neutrophils: 71 %
Platelets: 167 10*3/uL (ref 150–450)
RBC: 3.51 x10E6/uL — ABNORMAL LOW (ref 3.77–5.28)
RDW: 15.5 % — ABNORMAL HIGH (ref 11.7–15.4)
WBC: 8.1 10*3/uL (ref 3.4–10.8)

## 2020-05-20 LAB — URIC ACID: Uric Acid: 4.9 mg/dL (ref 3.1–7.9)

## 2020-05-22 ENCOUNTER — Telehealth: Payer: Self-pay

## 2020-05-22 NOTE — Telephone Encounter (Signed)
-----   Message from Jerrol Banana., MD sent at 05/22/2020 11:36 AM EDT ----- Labs stable.

## 2020-05-22 NOTE — Telephone Encounter (Signed)
Patient advised of lab results via mychart and has read the providers comments.

## 2020-06-05 NOTE — Progress Notes (Signed)
This encounter was created in error - please disregard.

## 2020-06-10 ENCOUNTER — Other Ambulatory Visit: Payer: Self-pay

## 2020-06-16 ENCOUNTER — Other Ambulatory Visit: Payer: Self-pay | Admitting: Family Medicine

## 2020-06-16 DIAGNOSIS — Z8673 Personal history of transient ischemic attack (TIA), and cerebral infarction without residual deficits: Secondary | ICD-10-CM

## 2020-07-01 ENCOUNTER — Other Ambulatory Visit: Payer: Self-pay | Admitting: Family Medicine

## 2020-07-01 DIAGNOSIS — I10 Essential (primary) hypertension: Secondary | ICD-10-CM

## 2020-07-04 NOTE — Progress Notes (Signed)
Cardiology Office Note  Date:  07/07/2020   ID:  Stephanie Charles, DOB 1930-02-05, MRN [id number]  PCP:  Jerrol Banana., MD   Chief Complaint  Patient presents with  . OTHER    OD 3 month f/u no complaints today. Meds reviewed verbally with pt.    HPI:  Stephanie Charles is a 86 y.o. female with history of  demand ischemia with hypertensive urgency,  LBBB,  TIA/CVA in 2016,  CKD stage II,  HTN,  HLD,  carotid artery disease,  hypothyroidism,  anxiety, depression,  Coronary disease, stent placed to proximal to mid LAD  ejection fraction 35 to 40%, repeat 09/2019: 40 to 45% Who presents for follow-up of her ischemic cardiomyopathy  Feels well, sedentary BP at home 130/65 No edema, sits a lot  She does the shopping No regular exercise program  No recent TIA symptoms She reports having several strokes Denies any anginal symptoms Tolerating Entresto  EKG personally reviewed by myself on todays visit Shows normal sinus rhythm with left bundle branch block, baseline artifact  Other past medical history reviewed hospital September 2020, ejection fraction 35 to 40% She had symptoms of chest pain and accelerated HTN found to have elevated high sensitivity troponin peaking at 923 and acute systolic CHF.    cardiac catheterization, with severe proximal to mid LAD disease, stent placed  Follow-up echocardiogram December 2020 ejection fraction 40 up to 45% Tolerating Entresto, carvedilol, HCTZ Reports blood pressure stable at home   PMH:   has a past medical history of Anemia, Diverticulitis, GERD (gastroesophageal reflux disease), Hyperlipidemia, Hypertension, Hypothyroidism, Myocardial infarction ([place]) (08/2017), TIA (transient ischemic attack), Vertigo, and [name].  PSH:    Past Surgical History:  Procedure Laterality Date  . COLONOSCOPY WITH PROPOFOL N/A 06/22/2018   Procedure: COLONOSCOPY WITH PROPOFOL;  Surgeon: Jonathon Bellows, MD;   [facility] END[facility];  Service: Endoscopy;  Laterality: N/A;  . CORONARY STENT INTERVENTION N/A 06/07/2019   Procedure: CORONARY STENT INTERVENTION;  Surgeon: [name], MD;  Location: [place] CV LAB;  Service: Cardiovascular;  Laterality: N/A;  . ESOPHAGOGASTRODUODENOSCOPY (EGD) WITH PROPOFOL N/A 06/22/2018   Procedure: ESOPHAGOGASTRODUODENOSCOPY (EGD) WITH PROPOFOL;  Surgeon: Jonathon Bellows, MD;  Location: [facility] ENDOSCOPY;  Service: Endoscopy;  Laterality: N/A;  . KNEE ARTHROSCOPY Right   . REPLACEMENT TOTAL KNEE BILATERAL    . RIGHT/LEFT HEART CATH AND CORONARY ANGIOGRAPHY N/A 06/07/2019   Procedure: RIGHT/LEFT HEART CATH AND CORONARY ANGIOGRAPHY;  Surgeon: [name], MD;  Location: [place] CV LAB;  Service: Cardiovascular;  Laterality: N/A;  . SHOULDER SURGERY Left     Current Outpatient Medications  Medication Sig Dispense Refill  . acetaminophen (TYLENOL) 325 MG tablet Take 2 tablets (650 mg total) by mouth every 6 (six) hours as needed for mild pain or headache.    . albuterol (PROVENTIL) (2.5 MG/3ML) 0.083% nebulizer solution Take 3 mLs (2.5 mg total) by nebulization every 4 (four) hours as needed. 75 mL 3  . allopurinol (ZYLOPRIM) 300 MG tablet Take 1 tablet (300 mg total) by mouth daily. 90 tablet 3  . amitriptyline (ELAVIL) 10 MG tablet TAKE 2 TABLETS BY MOUTH AT BEDTIME 180 tablet 0  . aspirin EC 81 MG EC tablet Take 1 tablet (81 mg total) by mouth daily.    Marland Kitchen atorvastatin (LIPITOR) 40 MG tablet Take 1 tablet (40 mg total) by mouth daily. 90 tablet 3  . carvedilol (COREG) 12.5 MG tablet Take 1 tablet  by mouth twice daily 60 tablet 12  . clopidogrel (PLAVIX) 75 MG tablet Take 1 tablet by mouth once daily 90 tablet 0  . fluticasone (FLONASE) 50 MCG/ACT nasal spray Use 2 spray(s) in each nostril once daily 48 g 3  . hydrochlorothiazide (HYDRODIURIL) 25 MG tablet Take 1 tablet (25 mg total) by mouth daily. 90 tablet 3  . levothyroxine (SYNTHROID) 137 MCG tablet  Take 1 tablet by mouth once daily 90 tablet 0  . mirtazapine (REMERON) 30 MG tablet TAKE 1 TABLET BY MOUTH AT BEDTIME 90 tablet 3  . mometasone (ELOCON) 0.1 % cream Apply 1 application topically daily. 45 g 0  . Multiple Vitamin (MULTIVITAMIN) capsule Take 1 capsule by mouth daily.    . pantoprazole (PROTONIX) 40 MG tablet Take 1 tablet by mouth once daily 90 tablet 0  . sacubitril-valsartan (ENTRESTO) 49-51 MG Take 1 tablet by mouth 2 (two) times daily. 60 tablet 5  . sertraline (ZOLOFT) 25 MG tablet Take 1 tablet by mouth once daily 90 tablet 1  . nitroGLYCERIN (NITROSTAT) 0.4 MG SL tablet Place 1 tablet (0.4 mg total) under the tongue every 5 (five) minutes as needed for chest pain. 25 tablet 3   No current facility-administered medications for this visit.    Allergies:   Latex, Levofloxacin, Lisinopril, Povidone-iodine, Simvastatin, and Codeine   Social History:  The patient  reports that she has never smoked. She has never used smokeless tobacco. She reports that she does not drink alcohol and does not use drugs.   Family History:   family history includes Alzheimer's disease in her brother and brother; Breast cancer in her sister; Cancer in her brother; Clotting disorder in her daughter; Fibromyalgia in her daughter; Heart attack in her brother; Heart disease in her brother, brother, father, and mother; Hypertension in her mother; Mental illness in her brother; Stroke in her brother and mother.    Review of Systems: Review of Systems  Constitutional: Negative.   HENT: Negative.   Respiratory: Negative.   Cardiovascular: Negative.   Gastrointestinal: Negative.   Musculoskeletal: Negative.   Neurological: Negative.   Psychiatric/Behavioral: Negative.   All other systems reviewed and are negative.   PHYSICAL EXAM: VS:  BP 132/60 (BP Location: Left Arm, Patient Position: Sitting, Cuff Size: Normal)   Pulse 80   Ht 5\' 2"  (1.575 m)   Wt 160 lb 2 oz (72.6 kg)   SpO2 97%   BMI  29.29 kg/m  , BMI Body mass index is 29.29 kg/m. Constitutional:  oriented to person, place, and time. No distress.  HENT:  Head: Grossly normal Eyes:  no discharge. No scleral icterus.  Neck: No JVD, + right carotid bruits  Cardiovascular: Regular rate and rhythm, no murmurs appreciated Pulmonary/Chest: Clear to auscultation bilaterally, no wheezes or rails Abdominal: Soft.  no distension.  no tenderness.  Musculoskeletal: Normal range of motion Neurological:  normal muscle tone. Coordination normal. No atrophy Skin: Skin warm and dry Psychiatric: normal affect, pleasant  Recent Labs: 05/19/2020: ALT 12; BUN 26; Creatinine, Ser 1.48; Hemoglobin 10.7; Platelets 167; Potassium 4.5; Sodium 140; TSH 1.110    Lipid Panel Lab Results  Component Value Date   CHOL 115 03/13/2020   HDL 29 (L) 03/13/2020   LDLCALC 53 03/13/2020   TRIG 197 (H) 03/13/2020      Wt Readings from Last 3 Encounters:  07/07/20 160 lb 2 oz (72.6 kg)  05/19/20 162 lb 6.4 oz (73.7 kg)  03/13/20 165 lb (74.8 kg)  ASSESSMENT AND PLAN:  Problem List Items Addressed This Visit      Cardiology Problems   Benign essential HTN    Other Visit Diagnoses    Chronic combined systolic and diastolic heart failure ([place])    -  Primary   Relevant Orders   EKG 12-Lead   Ischemic cardiomyopathy       Relevant Orders   EKG 12-Lead   Coronary artery disease involving native coronary artery of native heart without angina pectoris       Stage 3 chronic kidney disease, unspecified whether stage 3a or 3b CKD (HCC)       Labile hypertension       Hyperlipidemia LDL goal <70       PAD (peripheral artery disease) (HCC)       Bruit       Relevant Orders   VAS US CAROTID     Ischemic cardiomyopathy Tolerating Entresto, HCTZ, carvedilol No medication changes at this time Appears euvolemic  Chronic kidney disease stage III Creatinine 1.48, likely prerenal Recommend she increase her fluid intake slightly  CAD  with stable angina Stent placed to her proximal to mid LAD Improved ejection fraction since that time as detailed above Denies any anginal symptoms, Cholesterol at goal  Carotid bruit Carotid ultrasound ordered, noted on the right-hand side She reports having prior ultrasound many years ago  Hyperlipidemia On Lipitor 40 daily Numbers at goal    Total encounter time more than 25 minutes  Greater than 50% was spent in counseling and coordination of care with the patient    Signed, Esmond Plants, M.D., Ph.D. [place], [place]

## 2020-07-07 ENCOUNTER — Encounter: Payer: Self-pay | Admitting: Cardiovascular Disease

## 2020-07-07 ENCOUNTER — Other Ambulatory Visit: Payer: Self-pay

## 2020-07-07 ENCOUNTER — Ambulatory Visit (INDEPENDENT_AMBULATORY_CARE_PROVIDER_SITE_OTHER): Payer: Medicare Other | Admitting: Cardiovascular Disease

## 2020-07-07 VITALS — BP 132/60 | HR 80 | Ht 62.0 in | Wt 160.1 lb

## 2020-07-07 DIAGNOSIS — I10 Essential (primary) hypertension: Secondary | ICD-10-CM | POA: Diagnosis not present

## 2020-07-07 DIAGNOSIS — I255 Ischemic cardiomyopathy: Secondary | ICD-10-CM | POA: Diagnosis not present

## 2020-07-07 DIAGNOSIS — N183 Chronic kidney disease, stage 3 unspecified: Secondary | ICD-10-CM | POA: Diagnosis not present

## 2020-07-07 DIAGNOSIS — I2510 Atherosclerotic heart disease of native coronary artery without angina pectoris: Secondary | ICD-10-CM

## 2020-07-07 DIAGNOSIS — E785 Hyperlipidemia, unspecified: Secondary | ICD-10-CM

## 2020-07-07 DIAGNOSIS — I739 Peripheral vascular disease, unspecified: Secondary | ICD-10-CM

## 2020-07-07 DIAGNOSIS — I5042 Chronic combined systolic (congestive) and diastolic (congestive) heart failure: Secondary | ICD-10-CM

## 2020-07-07 DIAGNOSIS — R0989 Other specified symptoms and signs involving the circulatory and respiratory systems: Secondary | ICD-10-CM

## 2020-07-07 NOTE — Patient Instructions (Addendum)
Medication Instructions:  No changes  If you need a refill on your cardiac medications before your next appointment, please call your pharmacy.    Lab work: No new labs needed   If you have labs (blood work) drawn today and your tests are completely normal, you will receive your results only by:  [place] (if you have MyChart) OR  A paper copy in the mail If you have any lab test that is abnormal or we need to change your treatment, we will call you to review the results.   Testing/Procedures: Carotid ultrasound for bruit Your physician has requested that you have a carotid duplex. This test is an ultrasound of the carotid arteries in your neck. It looks at blood flow through these arteries that supply the brain with blood. Allow one hour for this exam. There are no restrictions or special instructions.     Follow-Up: At [facility], you and your health needs are our priority.  As part of our continuing mission to provide you with exceptional heart care, we have created designated Provider Care Teams.  These Care Teams include your primary Cardiologist (physician) and Advanced Practice Providers (APPs -  Physician Assistants and Nurse Practitioners) who all work together to provide you with the care you need, when you need it.   You will need a follow up appointment in 12 months   Providers on your designated Care Team:    Murray Hodgkins, NP  Christell Faith, PA-C  Marrianne Mood, PA-C  Any Other Special Instructions Will Be Listed Below (If Applicable).  COVID-19 Vaccine Information can be found at: [email or web address].uk For questions related to vaccine distribution or appointments, please email [email or web address] or call [phone].

## 2020-07-14 ENCOUNTER — Telehealth: Payer: Self-pay

## 2020-07-14 NOTE — Telephone Encounter (Signed)
Copied from [place] [id number]. Topic: General - Other >> Jul 14, 2020 11:20 AM Alanda Slim E wrote: Reason for CRM: Pt called and asked to speak to [facility] about a personal matter/ please advise

## 2020-07-15 ENCOUNTER — Ambulatory Visit (INDEPENDENT_AMBULATORY_CARE_PROVIDER_SITE_OTHER): Payer: Medicare Other

## 2020-07-15 ENCOUNTER — Other Ambulatory Visit: Payer: Self-pay

## 2020-07-15 DIAGNOSIS — R0989 Other specified symptoms and signs involving the circulatory and respiratory systems: Secondary | ICD-10-CM

## 2020-07-28 ENCOUNTER — Other Ambulatory Visit: Payer: Self-pay | Admitting: Family Medicine

## 2020-07-28 DIAGNOSIS — I10 Essential (primary) hypertension: Secondary | ICD-10-CM

## 2020-07-28 DIAGNOSIS — F3341 Major depressive disorder, recurrent, in partial remission: Secondary | ICD-10-CM

## 2020-07-28 NOTE — Telephone Encounter (Signed)
Requested Prescriptions  Pending Prescriptions Disp Refills   atorvastatin (LIPITOR) 40 MG tablet [Pharmacy Med Name: Atorvastatin Calcium 40 MG Oral Tablet] 90 tablet 3    Sig: Take 1 tablet by mouth once daily     Cardiovascular:  Antilipid - Statins Failed - 07/28/2020 11:10 AM      Failed - LDL in normal range and within 360 days    Ldl Cholesterol, Calc  Date Value Ref Range Status  10/19/2011 75 0 - 100 mg/dL Final   LDL Chol Calc (NIH)  Date Value Ref Range Status  03/13/2020 53 0 - 99 mg/dL Final         Failed - HDL in normal range and within 360 days    HDL Cholesterol  Date Value Ref Range Status  10/19/2011 15 (L) 40 - 60 mg/dL Final   HDL  Date Value Ref Range Status  03/13/2020 29 (L) >39 mg/dL Final         Failed - Triglycerides in normal range and within 360 days    Triglycerides  Date Value Ref Range Status  03/13/2020 197 (H) 0 - 149 mg/dL Final  10/19/2011 152 0 - 200 mg/dL Final         Passed - Total Cholesterol in normal range and within 360 days    Cholesterol, Total  Date Value Ref Range Status  03/13/2020 115 100 - 199 mg/dL Final   Cholesterol  Date Value Ref Range Status  10/19/2011 120 0 - 200 mg/dL Final         Passed - Patient is not pregnant      Passed - Valid encounter within last 12 months    Recent Outpatient Visits          2 months ago Benign essential HTN   [place] Jerrol Banana., MD   4 months ago LLQ abdominal pain   [facility] Carles Collet M, [place]   1 year ago Non-ST elevation (NSTEMI) myocardial infarction [facility])   [facility] Jerrol Banana., MD   1 year ago Iron deficiency anemia due to chronic blood loss   [facility] Jerrol Banana., MD   1 year ago Anemia, unspecified type   [facility] Jerrol Banana., MD      Future Appointments            In 3 weeks Jerrol Banana., MD [facility], [place]   In 2 months Gollan, Kathlene November, MD [place], [facility]            allopurinol (ZYLOPRIM) 300 MG tablet [Pharmacy Med Name: Allopurinol 300 MG Oral Tablet] 90 tablet 3    Sig: Take 1 tablet by mouth once daily     Endocrinology:  Gout Agents Failed - 07/28/2020 11:10 AM      Failed - Cr in normal range and within 360 days    Creatinine  Date Value Ref Range Status  04/26/2014 1.20 0.60 - 1.30 mg/dL Final   Creatinine, Ser  Date Value Ref Range Status  05/19/2020 1.48 (H) 0.57 - 1.00 mg/dL Final         Passed - Uric Acid in normal range and within 360 days    Uric Acid  Date Value Ref Range Status  05/19/2020 4.9 3.1 - 7.9 mg/dL Final    Comment:  Therapeutic target for gout patients: <6.0         Passed - Valid encounter within last 12 months    Recent Outpatient Visits          2 months ago Benign essential HTN   [facility] Jerrol Banana., MD   4 months ago LLQ abdominal pain   [facility] Carles Collet [place], [place]   1 year ago Non-ST elevation (NSTEMI) myocardial infarction [facility])   [facility] Jerrol Banana., MD   1 year ago Iron deficiency anemia due to chronic blood loss   [facility] Jerrol Banana., MD   1 year ago Anemia, unspecified type   [facility] Jerrol Banana., MD      Future Appointments            In 3 weeks Jerrol Banana., MD [facility], [facility]   In 2 months Gollan, Kathlene November, MD [facility] [place], [facility]            levothyroxine (SYNTHROID) 137 MCG tablet [Pharmacy Med Name: Levothyroxine Sodium 137 MCG Oral Tablet] 90 tablet 3    Sig: Take 1 tablet by mouth once daily     Endocrinology:  Hypothyroid Agents Failed - 07/28/2020 11:10 AM      Failed - TSH needs to be rechecked within 3 months after an abnormal result. Refill until TSH is due.      Passed  - TSH in normal range and within 360 days    TSH  Date Value Ref Range Status  05/19/2020 1.110 0.450 - 4.500 uIU/mL Final         Passed - Valid encounter within last 12 months    Recent Outpatient Visits          2 months ago Benign essential HTN   [facility] Jerrol Banana., MD   4 months ago LLQ abdominal pain   [facility] [place], Fabio Bering M, [place]   1 year ago Non-ST elevation (NSTEMI) myocardial infarction [facility])   [facility] Jerrol Banana., MD   1 year ago Iron deficiency anemia due to chronic blood loss   [facility] Jerrol Banana., MD   1 year ago Anemia, unspecified type   [facility] Jerrol Banana., MD      Future Appointments            In 3 weeks Jerrol Banana., MD [facility], [place]   In 2 months Gollan, Kathlene November, MD [facility], [facility]            mirtazapine (REMERON) 30 MG tablet [Pharmacy Med Name: Mirtazapine 30 MG Oral Tablet] 90 tablet 0    Sig: TAKE 1 TABLET BY MOUTH AT BEDTIME     Psychiatry: Antidepressants - mirtazapine Failed - 07/28/2020 11:10 AM      Failed - Triglycerides in normal range and within 360 days    Triglycerides  Date Value Ref Range Status  03/13/2020 197 (H) 0 - 149 mg/dL Final  10/19/2011 152 0 - 200 mg/dL Final         Passed - AST in normal range and within 360 days    AST  Date Value Ref Range Status  05/19/2020 15 0 - 40 IU/L Final   SGOT(AST)  Date Value Ref Range Status  04/26/2014 22 15 - 37 Unit/L Final  Passed - ALT in normal range and within 360 days    ALT  Date Value Ref Range Status  05/19/2020 12 0 - 32 IU/L Final   SGPT (ALT)  Date Value Ref Range Status  04/26/2014 29 U/L Final    Comment:    14-63 NOTE: New Reference Range 04/23/14          Passed - Total Cholesterol in normal range and within 360 days    Cholesterol, Total  Date Value  Ref Range Status  03/13/2020 115 100 - 199 mg/dL Final   Cholesterol  Date Value Ref Range Status  10/19/2011 120 0 - 200 mg/dL Final         Passed - WBC in normal range and within 360 days    WBC  Date Value Ref Range Status  05/19/2020 8.1 3.4 - 10.8 x10E3/uL Final  07/02/2019 9.1 4.0 - 10.5 K/uL Final         Passed - Completed PHQ-2 or PHQ-9 in the last 360 days.      Passed - Valid encounter within last 6 months    Recent Outpatient Visits          2 months ago Benign essential HTN   [facility] Jerrol Banana., MD   4 months ago LLQ abdominal pain   [facility] Carles Collet [place], [place]   1 year ago Non-ST elevation (NSTEMI) myocardial infarction [facility])   [facility] Jerrol Banana., MD   1 year ago Iron deficiency anemia due to chronic blood loss   [place] Jerrol Banana., MD   1 year ago Anemia, unspecified type   [facility] Jerrol Banana., MD      Future Appointments            In 3 weeks Jerrol Banana., MD [facility], [facility]   In 2 months Gollan, Kathlene November, MD [place], [facility]            hydrochlorothiazide (HYDRODIURIL) 25 MG tablet [Pharmacy Med Name: hydroCHLOROthiazide 25 MG Oral Tablet] 90 tablet 0    Sig: Take 1 tablet by mouth once daily     Cardiovascular: Diuretics - Thiazide Failed - 07/28/2020 11:10 AM      Failed - Cr in normal range and within 360 days    Creatinine  Date Value Ref Range Status  04/26/2014 1.20 0.60 - 1.30 mg/dL Final   Creatinine, Ser  Date Value Ref Range Status  05/19/2020 1.48 (H) 0.57 - 1.00 mg/dL Final         Passed - Ca in normal range and within 360 days    Calcium  Date Value Ref Range Status  05/19/2020 9.2 8.7 - 10.3 mg/dL Final   Calcium, Total  Date Value Ref Range Status  04/26/2014 8.8 8.5 - 10.1 mg/dL Final         Passed - K in normal range and  within 360 days    Potassium  Date Value Ref Range Status  05/19/2020 4.5 3.5 - 5.2 mmol/L Final  04/26/2014 3.9 3.5 - 5.1 mmol/L Final         Passed - Na in normal range and within 360 days    Sodium  Date Value Ref Range Status  05/19/2020 140 134 - 144 mmol/L Final  04/26/2014 133 (L) 136 - 145 mmol/L Final         Passed - Last  BP in normal range    BP Readings from Last 1 Encounters:  07/07/20 132/60         Passed - Valid encounter within last 6 months    Recent Outpatient Visits          2 months ago Benign essential HTN   [facility] Jerrol Banana., MD   4 months ago LLQ abdominal pain   [facility] Carles Collet [place], [place]   1 year ago Non-ST elevation (NSTEMI) myocardial infarction [facility])   [facility] Jerrol Banana., MD   1 year ago Iron deficiency anemia due to chronic blood loss   [facility] Jerrol Banana., MD   1 year ago Anemia, unspecified type   [facility] Jerrol Banana., MD      Future Appointments            In 3 weeks Jerrol Banana., MD [facility], [place]   In 2 months Gollan, Kathlene November, MD [facility], [facility]

## 2020-08-12 ENCOUNTER — Other Ambulatory Visit: Payer: Self-pay | Admitting: Family Medicine

## 2020-08-12 DIAGNOSIS — G4700 Insomnia, unspecified: Secondary | ICD-10-CM

## 2020-08-14 NOTE — Progress Notes (Signed)
Subjective:   Stephanie Charles is a 86 y.o. female who presents for Medicare Annual (Subsequent) preventive examination.  I connected with Hartlyn Reigel today by telephone and verified that I am speaking with the correct person using two identifiers. Location patient: home Location provider: work Persons participating in the virtual visit: patient, provider.   I discussed the limitations, risks, security and privacy concerns of performing an evaluation and management service by telephone and the availability of in person appointments. I also discussed with the patient that there may be a patient responsible charge related to this service. The patient expressed understanding and verbally consented to this telephonic visit.    Interactive audio and video telecommunications were attempted between this provider and patient, however failed, due to patient having technical difficulties OR patient did not have access to video capability.  We continued and completed visit with audio only.   Review of Systems    N/A  Cardiac Risk Factors include: advanced age (>36men, >37 women);dyslipidemia;hypertension     Objective:    Today's Vitals   08/18/20 1115  PainSc: 6    There is no height or weight on file to calculate BMI.  Advanced Directives 08/18/2020 06/07/2019 06/06/2019 06/06/2019 06/22/2018 03/16/2018 10/28/2015  Does Patient Have a Medical Advance Directive? Yes Yes Yes No;Yes Yes No Yes;No  Type of Paramedic of [place];Living will [place];Living will [place];Living will - Living will - -  Does patient want to make changes to medical advance directive? - - No - Patient declined - - - -  Copy of [place] in Chart? No - copy requested - No - copy requested - - - -  Would patient like information on creating a medical advance directive? - - - - - - -    Current Medications (verified) Outpatient Encounter  Medications as of 08/18/2020  Medication Sig  . acetaminophen (TYLENOL) 325 MG tablet Take 2 tablets (650 mg total) by mouth every 6 (six) hours as needed for mild pain or headache.  . albuterol (PROVENTIL) (2.5 MG/3ML) 0.083% nebulizer solution Take 3 mLs (2.5 mg total) by nebulization every 4 (four) hours as needed.  Marland Kitchen allopurinol (ZYLOPRIM) 300 MG tablet Take 1 tablet by mouth once daily  . amitriptyline (ELAVIL) 10 MG tablet TAKE 2 TABLETS BY MOUTH AT BEDTIME  . aspirin EC 81 MG EC tablet Take 1 tablet (81 mg total) by mouth daily.  Marland Kitchen atorvastatin (LIPITOR) 40 MG tablet Take 1 tablet by mouth once daily  . carvedilol (COREG) 12.5 MG tablet Take 1 tablet by mouth twice daily  . clopidogrel (PLAVIX) 75 MG tablet Take 1 tablet by mouth once daily  . fluticasone (FLONASE) 50 MCG/ACT nasal spray Use 2 spray(s) in each nostril once daily  . hydrochlorothiazide (HYDRODIURIL) 25 MG tablet Take 1 tablet by mouth once daily  . levothyroxine (SYNTHROID) 137 MCG tablet Take 1 tablet by mouth once daily  . mirtazapine (REMERON) 30 MG tablet TAKE 1 TABLET BY MOUTH AT BEDTIME  . mometasone (ELOCON) 0.1 % cream Apply 1 application topically daily.  . Multiple Vitamin (MULTIVITAMIN) capsule Take 1 capsule by mouth daily.  . nitroGLYCERIN (NITROSTAT) 0.4 MG SL tablet Place 1 tablet (0.4 mg total) under the tongue every 5 (five) minutes as needed for chest pain.  . pantoprazole (PROTONIX) 40 MG tablet Take 1 tablet by mouth once daily  . sacubitril-valsartan (ENTRESTO) 49-51 MG Take 1 tablet by mouth 2 (  two) times daily.  . sertraline (ZOLOFT) 25 MG tablet Take 1 tablet by mouth once daily   No facility-administered encounter medications on file as of 08/18/2020.    Allergies (verified) Latex, Levofloxacin, Lisinopril, Povidone-iodine, Simvastatin, and Codeine   History: Past Medical History:  Diagnosis Date  . Anemia   . Diverticulitis   . GERD (gastroesophageal reflux disease)   . Hyperlipidemia    . Hypertension   . Hypothyroidism   . Myocardial infarction ([place]) 08/2017  . TIA (transient ischemic attack)    1 approx 2015, 1 approx 2018  . Vertigo    last episode several months ago  . [name]    Past Surgical History:  Procedure Laterality Date  . COLONOSCOPY WITH PROPOFOL N/A 06/22/2018   Procedure: COLONOSCOPY WITH PROPOFOL;  Surgeon: Jonathon Bellows, MD;  Location: [facility] ENDOSCOPY;  Service: Endoscopy;  Laterality: N/A;  . CORONARY STENT INTERVENTION N/A 06/07/2019   Procedure: CORONARY STENT INTERVENTION;  Surgeon: [name], MD;  Location: [place] CV LAB;  Service: Cardiovascular;  Laterality: N/A;  . ESOPHAGOGASTRODUODENOSCOPY (EGD) WITH PROPOFOL N/A 06/22/2018   Procedure: ESOPHAGOGASTRODUODENOSCOPY (EGD) WITH PROPOFOL;  Surgeon: Jonathon Bellows, MD;  Location: [facility] ENDOSCOPY;  Service: Endoscopy;  Laterality: N/A;  . KNEE ARTHROSCOPY Right   . REPLACEMENT TOTAL KNEE BILATERAL    . RIGHT/LEFT HEART CATH AND CORONARY ANGIOGRAPHY N/A 06/07/2019   Procedure: RIGHT/LEFT HEART CATH AND CORONARY ANGIOGRAPHY;  Surgeon: [name], MD;  Location: [place] CV LAB;  Service: Cardiovascular;  Laterality: N/A;  . SHOULDER SURGERY Left    Family History  Problem Relation Age of Onset  . Heart disease Mother   . Hypertension Mother   . Stroke Mother   . Heart disease Father   . Breast cancer Sister   . Alzheimer's disease Brother   . Heart attack Brother   . Stroke Brother   . Heart disease Brother   . Clotting disorder Daughter   . Mental illness Brother        died suicide  . Cancer Brother        prostate  . Heart disease Brother        atrial fib  . Alzheimer's disease Brother   . Fibromyalgia Daughter    Social History   Socioeconomic History  . Marital status: Divorced    Spouse name: na  . Number of children: 4  . Years of education: 10  . Highest education level: Associate degree: occupational, Hotel manager, or vocational  program  Occupational History  . Occupation: retired  Tobacco Use  . Smoking status: Never Smoker  . Smokeless tobacco: Never Used  Vaping Use  . Vaping Use: Never used  Substance and Sexual Activity  . Alcohol use: No    Alcohol/week: 0.0 standard drinks  . Drug use: No  . Sexual activity: Never  Other Topics Concern  . Not on file  Social History Narrative  . Not on file   Social Determinants of Health   Financial Resource Strain: Low Risk   . Difficulty of Paying Living Expenses: Not hard at all  Food Insecurity: No Food Insecurity  . Worried About Charity fundraiser in the Last Year: Never true  . Ran Out of Food in the Last Year: Never true  Transportation Needs: No Transportation Needs  . Lack of Transportation (Medical): No  . Lack of Transportation (Non-Medical): No  Physical Activity: Inactive  . Days of Exercise per Week: 0  days  . Minutes of Exercise per Session: 0 min  Stress: No Stress Concern Present  . Feeling of Stress : Not at all  Social Connections: Socially Isolated  . Frequency of Communication with Friends and Family: More than three times a week  . Frequency of Social Gatherings with Friends and Family: More than three times a week  . Attends Religious Services: Never  . Active Member of Clubs or Organizations: No  . Attends Archivist Meetings: Never  . Marital Status: Divorced    Tobacco Counseling Counseling given: Not Answered   Clinical Intake:  Pre-visit preparation completed: Yes  Pain : 0-10 Pain Score: 6  Pain Type: Acute pain Pain Location: Hip Pain Orientation: Right Pain Descriptors / Indicators: Aching Pain Frequency: Intermittent Pain Relieving Factors: None.  Pain Relieving Factors: None.  Nutritional Risks: None Diabetes: No  How often do you need to have someone help you when you read instructions, pamphlets, or other written materials from your doctor or pharmacy?: 1 - Never  Diabetic?  No  Interpreter Needed?: No  Information entered by :: [facility], LPN   Activities of Daily Living In your present state of health, do you have any difficulty performing the following activities: 08/18/2020 05/19/2020  Hearing? N Y  Comment Wears a hearing aid in the right ear. -  Vision? N N  Difficulty concentrating or making decisions? N N  Walking or climbing stairs? N Y  Dressing or bathing? N N  Doing errands, shopping? N N  Preparing Food and eating ? N -  Using the Toilet? N -  In the past six months, have you accidently leaked urine? N -  Do you have problems with loss of bowel control? N -  Managing your Medications? N -  Managing your Finances? N -  Housekeeping or managing your Housekeeping? N -  Some recent data might be hidden    Patient Care Team: Jerrol Banana., MD as PCP - General (Family Medicine) Rockey Situ, Kathlene November, MD as PCP - Cardiology (Cardiology) [place], [place] as Consulting Physician (Optometry)  Indicate any recent Medical Services you may have received from other than [facility] providers in the past year (date may be approximate).     Assessment:   This is a routine wellness examination for Julieanne.  Hearing/Vision screen No exam data present  Dietary issues and exercise activities discussed: Current Exercise Habits: The patient does not participate in regular exercise at present, Exercise limited by: orthopedic condition(s)  Goals    . Increase Exercise      Recommend to start the senior class that will be once weekly for 1 hour to help with balance and strength training.     . Prevent falls     Recommend to remove any items from the home that may cause slips or trips.      Depression Screen PHQ 2/9 Scores 05/19/2020 01/18/2019 03/16/2018 05/04/2017 03/25/2016  PHQ - 2 Score 0 0 1 0 0  PHQ- 9 Score 3 - - 1 -    Fall Risk Fall Risk  08/18/2020 01/18/2019 03/16/2018 05/04/2017 03/25/2016  Falls in the past year? 1 0 No No No  Number  falls in past yr: 0 - - - -  Injury with Fall? 0 - - - -  Follow up Falls prevention discussed - - - -    Any stairs in or around the home? No  If so, are there any without handrails? No  Home  free of loose throw rugs in walkways, pet beds, electrical cords, etc? Yes  Adequate lighting in your home to reduce risk of falls? Yes   ASSISTIVE DEVICES UTILIZED TO PREVENT FALLS:  Life alert? No  Use of a cane, walker or w/c? No  Grab bars in the bathroom? Yes  Shower chair or bench in shower? Yes  Elevated toilet seat or a handicapped toilet? No    Cognitive Function:     6CIT Screen 08/18/2020 03/16/2018  What Year? 0 points 0 points  What month? 0 points 0 points  What time? 0 points 0 points  Count back from 20 0 points 0 points  Months in reverse 4 points 2 points  Repeat phrase 2 points 0 points  Total Score 6 2    Immunizations Immunization History  Administered Date(s) Administered  . Fluad Quad(high Dose 65+) 07/03/2019  . Influenza, High Dose Seasonal PF 07/03/2015, 08/05/2016, 05/25/2017, 07/05/2018  . PFIZER SARS-COV-2 Vaccination 11/25/2019, 12/18/2019  . Pneumococcal Conjugate-13 07/03/2015  . Pneumococcal Polysaccharide-23 06/20/2013  . Tdap 02/23/2012  . Zoster 08/03/2011  . Zoster Recombinat (Shingrix) 07/05/2018    TDAP status: Up to date Flu Vaccine status: Declined, Education has been provided regarding the importance of this vaccine but patient still declined. Advised may receive this vaccine at local pharmacy or Health Dept. Aware to provide a copy of the vaccination record if obtained from local pharmacy or Health Dept. Verbalized acceptance and understanding. Pneumococcal vaccine status: Up to date Covid-19 vaccine status: Completed vaccines  Qualifies for Shingles Vaccine? Yes   Zostavax completed Yes   Shingrix Completed?: No.    Education has been provided regarding the importance of this vaccine. Patient has been advised to call insurance  company to determine out of pocket expense if they have not yet received this vaccine. Advised may also receive vaccine at local pharmacy or Health Dept. Verbalized acceptance and understanding.  Screening Tests Health Maintenance  Topic Date Due  . DEXA SCAN  04/10/2013  . INFLUENZA VACCINE  05/04/2020  . TETANUS/TDAP  02/22/2022  . COVID-19 Vaccine  Completed  . PNA vac Low Risk Adult  Completed    Health Maintenance  Health Maintenance Due  Topic Date Due  . DEXA SCAN  04/10/2013  . INFLUENZA VACCINE  05/04/2020    Colorectal cancer screening: No longer required.  Mammogram status: No longer required.  Bone Density status: Ordered today. Pt provided with contact info and advised to call to schedule appt.  Lung Cancer Screening: (Low Dose CT Chest recommended if Age 26-80 years, 30 pack-year currently smoking OR have quit w/in 15years.) does not qualify.   Additional Screening:  Vision Screening: Recommended annual ophthalmology exams for early detection of glaucoma and other disorders of the eye. Is the patient up to date with their annual eye exam?  Yes  Who is the provider or what is the name of the office in which the patient attends annual eye exams? [facility] If pt is not established with a provider, would they like to be referred to a provider to establish care? No .   Dental Screening: Recommended annual dental exams for proper oral hygiene  Community Resource Referral / Chronic Care Management: CRR required this visit?  No   CCM required this visit?  No      Plan:     I have personally reviewed and noted the following in the patient's chart:   . Medical and social history . Use of alcohol, tobacco or  illicit drugs  . Current medications and supplements . Functional ability and status . Nutritional status . Physical activity . Advanced directives . List of other physicians . Hospitalizations, surgeries, and ER visits in previous 12  months . Vitals . Screenings to include cognitive, depression, and falls . Referrals and appointments  In addition, I have reviewed and discussed with patient certain preventive protocols, quality metrics, and best practice recommendations. A written personalized care plan for preventive services as well as general preventive health recommendations were provided to patient.     Nygel Prokop [place], [place]   [id number]   Nurse Notes: Pt would like to receive a flu shot at next in office apt on 08/21/20.

## 2020-08-18 ENCOUNTER — Ambulatory Visit (INDEPENDENT_AMBULATORY_CARE_PROVIDER_SITE_OTHER): Payer: Medicare Other

## 2020-08-18 ENCOUNTER — Other Ambulatory Visit: Payer: Self-pay

## 2020-08-18 DIAGNOSIS — Z0000 Encounter for general adult medical examination without abnormal findings: Secondary | ICD-10-CM

## 2020-08-18 DIAGNOSIS — E2839 Other primary ovarian failure: Secondary | ICD-10-CM

## 2020-08-18 NOTE — Patient Instructions (Signed)
Stephanie Charles , Thank you for taking time to come for your Medicare Wellness Visit. I appreciate your ongoing commitment to your health goals. Please review the following plan we discussed and let me know if I can assist you in the future.   Screening recommendations/referrals: Colonoscopy: No longer required.  Mammogram: No longer required.  Bone Density: Ordered today. Pt aware office will contact her to schedule apt. Recommended yearly ophthalmology/optometry visit for glaucoma screening and checkup Recommended yearly dental visit for hygiene and checkup  Vaccinations: Influenza vaccine: Currently due, will receive at in office apt on 08/21/20. Pneumococcal vaccine: Completed series Tdap vaccine: Up to date, due 02/2022 Shingles vaccine: Shingrix discussed. Please contact your pharmacy for coverage information.     Advanced directives: Please bring a copy of your POA (Power of Attorney) and/or Living Will to your next appointment.   Conditions/risks identified: Fall risk preventatives discussed today.   Next appointment: 08/21/20 @ 2:20 PM with Dr Rosanna Randy    Preventive Care 65 Years and Older, Female Preventive care refers to lifestyle choices and visits with your health care provider that can promote health and wellness. What does preventive care include?  A yearly physical exam. This is also called an annual well check.  Dental exams once or twice a year.  Routine eye exams. Ask your health care provider how often you should have your eyes checked.  Personal lifestyle choices, including:  Daily care of your teeth and gums.  Regular physical activity.  Eating a healthy diet.  Avoiding tobacco and drug use.  Limiting alcohol use.  Practicing safe sex.  Taking low-dose aspirin every day.  Taking vitamin and mineral supplements as recommended by your health care provider. What happens during an annual well check? The services and screenings done by your health care  provider during your annual well check will depend on your age, overall health, lifestyle risk factors, and family history of disease. Counseling  Your health care provider may ask you questions about your:  Alcohol use.  Tobacco use.  Drug use.  Emotional well-being.  Home and relationship well-being.  Sexual activity.  Eating habits.  History of falls.  Memory and ability to understand (cognition).  Work and work Statistician.  Reproductive health. Screening  You may have the following tests or measurements:  Height, weight, and BMI.  Blood pressure.  Lipid and cholesterol levels. These may be checked every 5 years, or more frequently if you are over 49 years old.  Skin check.  Lung cancer screening. You may have this screening every year starting at age 61 if you have a 30-pack-year history of smoking and currently smoke or have quit within the past 15 years.  Fecal occult blood test (FOBT) of the stool. You may have this test every year starting at age 31.  Flexible sigmoidoscopy or colonoscopy. You may have a sigmoidoscopy every 5 years or a colonoscopy every 10 years starting at age 17.  Hepatitis C blood test.  Hepatitis B blood test.  Sexually transmitted disease (STD) testing.  Diabetes screening. This is done by checking your blood sugar (glucose) after you have not eaten for a while (fasting). You may have this done every 1-3 years.  Bone density scan. This is done to screen for osteoporosis. You may have this done starting at age 70.  Mammogram. This may be done every 1-2 years. Talk to your health care provider about how often you should have regular mammograms. Talk with your health care provider about  your test results, treatment options, and if necessary, the need for more tests. Vaccines  Your health care provider may recommend certain vaccines, such as:  Influenza vaccine. This is recommended every year.  Tetanus, diphtheria, and acellular  pertussis (Tdap, Td) vaccine. You may need a Td booster every 10 years.  Zoster vaccine. You may need this after age 66.  Pneumococcal 13-valent conjugate (PCV13) vaccine. One dose is recommended after age 50.  Pneumococcal polysaccharide (PPSV23) vaccine. One dose is recommended after age 19. Talk to your health care provider about which screenings and vaccines you need and how often you need them. This information is not intended to replace advice given to you by your health care provider. Make sure you discuss any questions you have with your health care provider. Document Released: 10/17/2015 Document Revised: 06/09/2016 Document Reviewed: 07/22/2015 Elsevier Interactive Patient Education  2017 [place] Prevention in the Home Falls can cause injuries. They can happen to people of all ages. There are many things you can do to make your home safe and to help prevent falls. What can I do on the outside of my home?  Regularly fix the edges of walkways and driveways and fix any cracks.  Remove anything that might make you trip as you walk through a door, such as a raised step or threshold.  Trim any bushes or trees on the path to your home.  Use bright outdoor lighting.  Clear any walking paths of anything that might make someone trip, such as rocks or tools.  Regularly check to see if handrails are loose or broken. Make sure that both sides of any steps have handrails.  Any raised decks and porches should have guardrails on the edges.  Have any leaves, snow, or ice cleared regularly.  Use sand or salt on walking paths during winter.  Clean up any spills in your garage right away. This includes oil or grease spills. What can I do in the bathroom?  Use night lights.  Install grab bars by the toilet and in the tub and shower. Do not use towel bars as grab bars.  Use non-skid mats or decals in the tub or shower.  If you need to sit down in the shower, use a plastic,  non-slip stool.  Keep the floor dry. Clean up any water that spills on the floor as soon as it happens.  Remove soap buildup in the tub or shower regularly.  Attach bath mats securely with double-sided non-slip rug tape.  Do not have throw rugs and other things on the floor that can make you trip. What can I do in the bedroom?  Use night lights.  Make sure that you have a light by your bed that is easy to reach.  Do not use any sheets or blankets that are too big for your bed. They should not hang down onto the floor.  Have a firm chair that has side arms. You can use this for support while you get dressed.  Do not have throw rugs and other things on the floor that can make you trip. What can I do in the kitchen?  Clean up any spills right away.  Avoid walking on wet floors.  Keep items that you use a lot in easy-to-reach places.  If you need to reach something above you, use a strong step stool that has a grab bar.  Keep electrical cords out of the way.  Do not use floor polish or wax  that makes floors slippery. If you must use wax, use non-skid floor wax.  Do not have throw rugs and other things on the floor that can make you trip. What can I do with my stairs?  Do not leave any items on the stairs.  Make sure that there are handrails on both sides of the stairs and use them. Fix handrails that are broken or loose. Make sure that handrails are as long as the stairways.  Check any carpeting to make sure that it is firmly attached to the stairs. Fix any carpet that is loose or worn.  Avoid having throw rugs at the top or bottom of the stairs. If you do have throw rugs, attach them to the floor with carpet tape.  Make sure that you have a light switch at the top of the stairs and the bottom of the stairs. If you do not have them, ask someone to add them for you. What else can I do to help prevent falls?  Wear shoes that:  Do not have high heels.  Have rubber  bottoms.  Are comfortable and fit you well.  Are closed at the toe. Do not wear sandals.  If you use a stepladder:  Make sure that it is fully opened. Do not climb a closed stepladder.  Make sure that both sides of the stepladder are locked into place.  Ask someone to hold it for you, if possible.  Clearly mark and make sure that you can see:  Any grab bars or handrails.  First and last steps.  Where the edge of each step is.  Use tools that help you move around (mobility aids) if they are needed. These include:  Canes.  Walkers.  Scooters.  Crutches.  Turn on the lights when you go into a dark area. Replace any light bulbs as soon as they burn out.  Set up your furniture so you have a clear path. Avoid moving your furniture around.  If any of your floors are uneven, fix them.  If there are any pets around you, be aware of where they are.  Review your medicines with your doctor. Some medicines can make you feel dizzy. This can increase your chance of falling. Ask your doctor what other things that you can do to help prevent falls. This information is not intended to replace advice given to you by your health care provider. Make sure you discuss any questions you have with your health care provider. Document Released: 07/17/2009 Document Revised: 02/26/2016 Document Reviewed: 10/25/2014 Elsevier Interactive Patient Education  2017 [employer].

## 2020-08-21 ENCOUNTER — Ambulatory Visit (INDEPENDENT_AMBULATORY_CARE_PROVIDER_SITE_OTHER): Payer: Medicare Other | Admitting: Family Medicine

## 2020-08-21 ENCOUNTER — Encounter: Payer: Self-pay | Admitting: Family Medicine

## 2020-08-21 ENCOUNTER — Other Ambulatory Visit: Payer: Self-pay

## 2020-08-21 VITALS — BP 159/63 | HR 64 | Temp 98.3°F | Resp 16 | Ht 62.0 in | Wt 164.0 lb

## 2020-08-21 DIAGNOSIS — F3341 Major depressive disorder, recurrent, in partial remission: Secondary | ICD-10-CM

## 2020-08-21 DIAGNOSIS — I6529 Occlusion and stenosis of unspecified carotid artery: Secondary | ICD-10-CM | POA: Diagnosis not present

## 2020-08-21 DIAGNOSIS — D500 Iron deficiency anemia secondary to blood loss (chronic): Secondary | ICD-10-CM | POA: Diagnosis not present

## 2020-08-21 DIAGNOSIS — I10 Essential (primary) hypertension: Secondary | ICD-10-CM | POA: Diagnosis not present

## 2020-08-21 DIAGNOSIS — I200 Unstable angina: Secondary | ICD-10-CM

## 2020-08-21 DIAGNOSIS — E782 Mixed hyperlipidemia: Secondary | ICD-10-CM

## 2020-08-21 DIAGNOSIS — Z23 Encounter for immunization: Secondary | ICD-10-CM | POA: Diagnosis not present

## 2020-08-21 IMAGING — DX DG CHEST 1V PORT
1 series · 1 of 1 positions shown · non-contrast
Comparison: Radiographs April 21, 2017.

CLINICAL DATA: Chest pain.

EXAM:
PORTABLE CHEST 1 VIEW

[chest ap]
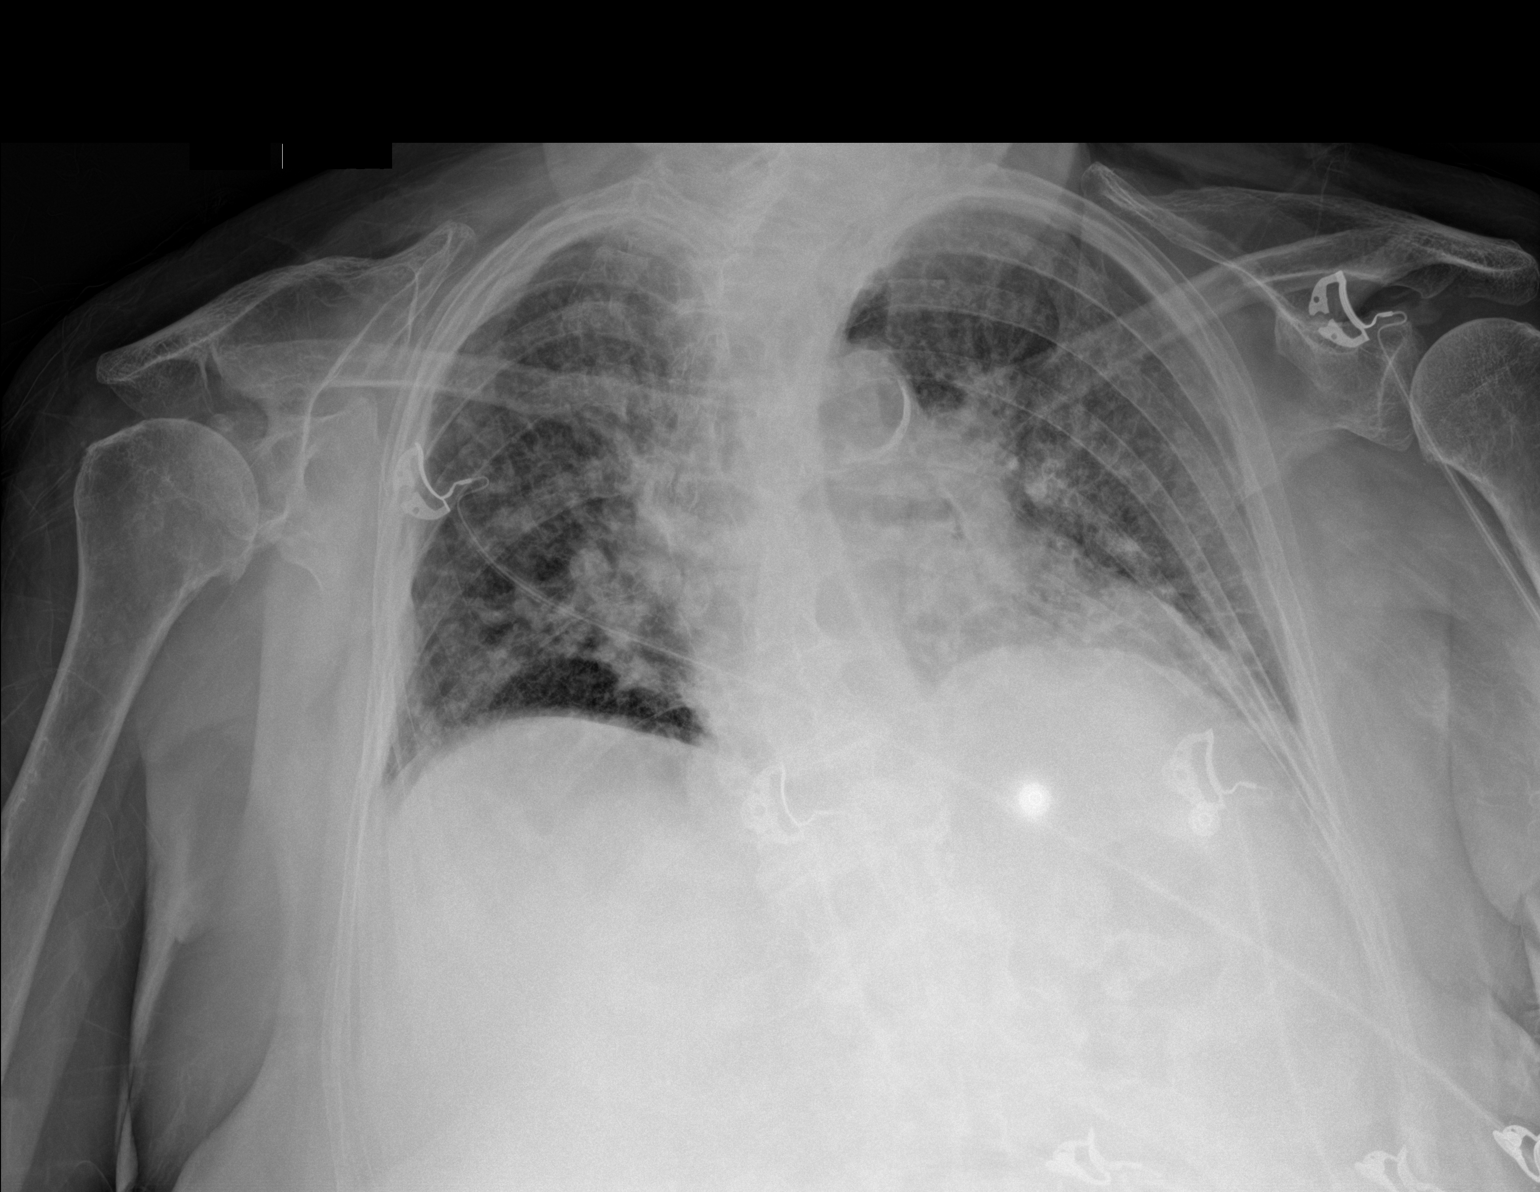

[1 of 1 positions shown; findings below may reference images not displayed]

FINDINGS: Stable cardiomediastinal silhouette. Atherosclerosis of thoracic
aorta is noted. Increased diffuse bilateral lung opacities are noted
concerning for either edema or multifocal pneumonia. No pneumothorax
or pleural effusion is noted. Bony thorax is unremarkable.
Hypoinflation of the lungs is noted.
IMPRESSION: Hypoinflation of the lungs. Increased diffuse bilateral lung
opacities are noted concerning for either edema or multifocal
pneumonia.

Aortic Atherosclerosis ([id number].[id number]).

## 2020-08-21 NOTE — Progress Notes (Signed)
I,[name],acting as a scribe for [name], MD.,have documented all relevant documentation on the behalf of [name], MD,as directed by  [name], MD while in the presence of [name], MD.   Established patient visit   Patient: Stephanie Charles   DOB: 1929-11-05   86 y.o. Female  MRN: [id number] Visit Date: 08/21/2020  Today's healthcare provider: [name], MD   Chief Complaint  Patient presents with  . Depression  . Follow-up  . Hypertension   Subjective    HPI  Pt feels well recently--no complaints. Hypertension, follow-up  BP Readings from Last 3 Encounters:  08/21/20 (!) 159/63  07/07/20 132/60  05/19/20 131/62   Wt Readings from Last 3 Encounters:  08/21/20 164 lb (74.4 kg)  07/07/20 160 lb 2 oz (72.6 kg)  05/19/20 162 lb 6.4 oz (73.7 kg)     She was last seen for hypertension 3 months ago.  BP at that visit was 131/62. Management since that visit includes; Good control. She reports good compliance with treatment. She is not having side effects. none She some exercising. She is adherent to low salt diet.   Outside blood pressures are 140/64.  She does not smoke.  Use of agents associated with hypertension: none.   -------------------------------------------------------------------- Depression, Follow-up  She  was last seen for this 3 months ago. Changes made at last visit include; On mirtazapine.   She reports good compliance with treatment. She is not having side effects. none  She reports good tolerance of treatment. Current symptoms include: fatigue She feels she is Unchanged since last visit.  Depression screen [facility] 2/9 08/21/2020 05/19/2020 01/18/2019  Decreased Interest 0 0 0  Down, Depressed, Hopeless 0 0 0  PHQ - 2 Score 0 0 0  Altered sleeping 2 3 -  Tired, decreased energy 0 0 -  Change in appetite 0 0 -  Feeling bad or failure about yourself  0 0 -  Trouble concentrating 0 0 -    Moving slowly or fidgety/restless 0 0 -  Suicidal thoughts 0 0 -  PHQ-9 Score 2 3 -  Difficult doing work/chores Not difficult at all Not difficult at all -    --------------------------------------------------------------------  Iron deficiency anemia due to chronic blood loss From 05/19/2020-Clinically stable 3.  Follow CBC      Medications: Outpatient Medications Prior to Visit  Medication Sig  . acetaminophen (TYLENOL) 325 MG tablet Take 2 tablets (650 mg total) by mouth every 6 (six) hours as needed for mild pain or headache.  . allopurinol (ZYLOPRIM) 300 MG tablet Take 1 tablet by mouth once daily  . amitriptyline (ELAVIL) 10 MG tablet TAKE 2 TABLETS BY MOUTH AT BEDTIME  . aspirin EC 81 MG EC tablet Take 1 tablet (81 mg total) by mouth daily.  Marland Kitchen atorvastatin (LIPITOR) 40 MG tablet Take 1 tablet by mouth once daily  . carvedilol (COREG) 12.5 MG tablet Take 1 tablet by mouth twice daily  . clopidogrel (PLAVIX) 75 MG tablet Take 1 tablet by mouth once daily  . fluticasone (FLONASE) 50 MCG/ACT nasal spray Use 2 spray(s) in each nostril once daily  . hydrochlorothiazide (HYDRODIURIL) 25 MG tablet Take 1 tablet by mouth once daily  . levothyroxine (SYNTHROID) 137 MCG tablet Take 1 tablet by mouth once daily  . mirtazapine (REMERON) 30 MG tablet TAKE 1 TABLET BY MOUTH AT BEDTIME  . mometasone (ELOCON) 0.1 % cream Apply 1 application topically daily.  Marland Kitchen  Multiple Vitamin (MULTIVITAMIN) capsule Take 1 capsule by mouth daily.  . pantoprazole (PROTONIX) 40 MG tablet Take 1 tablet by mouth once daily  . sacubitril-valsartan (ENTRESTO) 49-51 MG Take 1 tablet by mouth 2 (two) times daily.  . sertraline (ZOLOFT) 25 MG tablet Take 1 tablet by mouth once daily  . albuterol (PROVENTIL) (2.5 MG/3ML) 0.083% nebulizer solution Take 3 mLs (2.5 mg total) by nebulization every 4 (four) hours as needed.  . nitroGLYCERIN (NITROSTAT) 0.4 MG SL tablet Place 1 tablet (0.4 mg total) under the tongue every  5 (five) minutes as needed for chest pain.   No facility-administered medications prior to visit.    Review of Systems  Constitutional: Negative for appetite change, chills, fatigue and fever.  Respiratory: Negative for chest tightness and shortness of breath.   Cardiovascular: Negative for chest pain and palpitations.  Gastrointestinal: Negative for abdominal pain, nausea and vomiting.  Neurological: Negative for dizziness and weakness.      Objective    BP (!) 159/63 (BP Location: Right Arm, Patient Position: Sitting, Cuff Size: Large)   Pulse 64   Temp 98.3 F (36.8 C) (Oral)   Resp 16   Ht 5\' 2"  (1.575 m)   Wt 164 lb (74.4 kg)   SpO2 97%   BMI 30.00 kg/m    Physical Exam Vitals reviewed.  Constitutional:      Appearance: Normal appearance.  HENT:     Right Ear: External ear normal.     Left Ear: External ear normal.     Nose: Nose normal.  Eyes:     General: No scleral icterus.    Conjunctiva/sclera: Conjunctivae normal.  Cardiovascular:     Rate and Rhythm: Normal rate and regular rhythm.     Heart sounds: Normal heart sounds.  Pulmonary:     Effort: Pulmonary effort is normal.     Breath sounds: Normal breath sounds.  Abdominal:     Palpations: Abdomen is soft.     Tenderness: There is no abdominal tenderness.  Lymphadenopathy:     Cervical: No cervical adenopathy.  Skin:    General: Skin is warm and dry.     Comments: Fair skin.  Neurological:     General: No focal deficit present.     Mental Status: She is alert and oriented to person, place, and time.  Psychiatric:        Mood and Affect: Mood normal.        Behavior: Behavior normal.        Thought Content: Thought content normal.        Judgment: Judgment normal.       No results found for any visits on 08/21/20.  Assessment & Plan     1. Benign essential HTN Good control.on coreg,HCTZ, - Comprehensive metabolic panel  2. Iron deficiency anemia due to chronic blood loss Labs. - CBC  with Differential/Platelet - Iron, TIBC and Ferritin Panel  3. Need for COVID-19 vaccine  - [employer] Vaccine  4. Need for influenza vaccination  - Flu Vaccine QUAD High Dose(Fluad)  5. External carotid artery stenosis   6. Unstable angina ([place])   7. Mixed hyperlipidemia   8. Recurrent major depressive disorder, in partial remission ([place])  9.CHF Tolerating Entresto   No follow-ups on file.         Quintus Premo Cranford Mon, MD  [facility] [phone] (phone) [phone] (fax)  [place]

## 2020-08-22 LAB — CBC WITH DIFFERENTIAL/PLATELET
Basophils Absolute: 0.1 10*3/uL (ref 0.0–0.2)
Basos: 1 %
EOS (ABSOLUTE): 0.2 10*3/uL (ref 0.0–0.4)
Eos: 2 %
Hematocrit: 34.8 % (ref 34.0–46.6)
Hemoglobin: 11.6 g/dL (ref 11.1–15.9)
Immature Grans (Abs): 0 10*3/uL (ref 0.0–0.1)
Immature Granulocytes: 0 %
Lymphocytes Absolute: 1.9 10*3/uL (ref 0.7–3.1)
Lymphs: 24 %
MCH: 30.6 pg (ref 26.6–33.0)
MCHC: 33.3 g/dL (ref 31.5–35.7)
MCV: 92 fL (ref 79–97)
Monocytes Absolute: 0.5 10*3/uL (ref 0.1–0.9)
Monocytes: 6 %
Neutrophils Absolute: 5.3 10*3/uL (ref 1.4–7.0)
Neutrophils: 67 %
Platelets: 146 10*3/uL — ABNORMAL LOW (ref 150–450)
RBC: 3.79 x10E6/uL (ref 3.77–5.28)
RDW: 14.2 % (ref 11.7–15.4)
WBC: 8 10*3/uL (ref 3.4–10.8)

## 2020-08-22 LAB — IRON,TIBC AND FERRITIN PANEL
Ferritin: 55 ng/mL (ref 15–150)
Iron Saturation: 21 % (ref 15–55)
Iron: 55 ug/dL (ref 27–139)
Total Iron Binding Capacity: 260 ug/dL (ref 250–450)
UIBC: 205 ug/dL (ref 118–369)

## 2020-08-22 LAB — COMPREHENSIVE METABOLIC PANEL
ALT: 19 IU/L (ref 0–32)
AST: 21 IU/L (ref 0–40)
Albumin/Globulin Ratio: 2.1 (ref 1.2–2.2)
Albumin: 4.2 g/dL (ref 3.6–4.6)
Alkaline Phosphatase: 117 IU/L (ref 44–121)
BUN/Creatinine Ratio: 17 (ref 12–28)
BUN: 19 mg/dL (ref 8–27)
Bilirubin Total: 0.3 mg/dL (ref 0.0–1.2)
CO2: 26 mmol/L (ref 20–29)
Calcium: 9.1 mg/dL (ref 8.7–10.3)
Chloride: 99 mmol/L (ref 96–106)
Creatinine, Ser: 1.11 mg/dL — ABNORMAL HIGH (ref 0.57–1.00)
GFR calc Af Amer: 52 mL/min/{1.73_m2} — ABNORMAL LOW (ref >59)
GFR calc non Af Amer: 45 mL/min/{1.73_m2} — ABNORMAL LOW (ref >59)
Globulin, Total: 2 g/dL (ref 1.5–4.5)
Glucose: 90 mg/dL (ref 65–99)
Potassium: 4.4 mmol/L (ref 3.5–5.2)
Sodium: 138 mmol/L (ref 134–144)
Total Protein: 6.2 g/dL (ref 6.0–8.5)

## 2020-09-05 IMAGING — US US EXTREM LOW DUPLEX ARTERIAL*R* LIMITED
1 series · 14 of 16 positions shown · non-contrast
Comparison: None.

CLINICAL DATA: 88-year-old female with recent arterial access and
concern for pseudoaneurysm

EXAM:
LIMITED RIGHT LOWER EXTREMITY ARTERIAL DUPLEX SCAN
TECHNIQUE: Gray-scale sonography as well as colorduplex ultrasound was
performed as a limited arterial exam of the right leg.

[Series 1: us extrem low duplex arterial*right* limited · 0.08mm/px · 16 acquisitions, 14 frames shown]
[im 1/16]
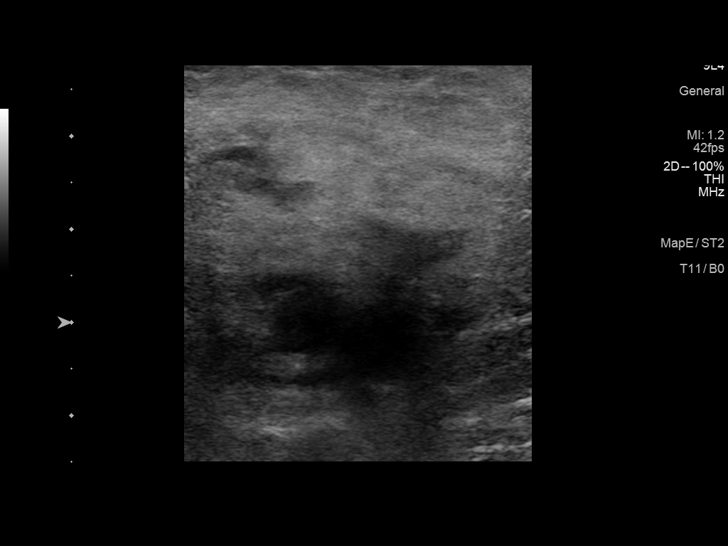
[im 2/16]
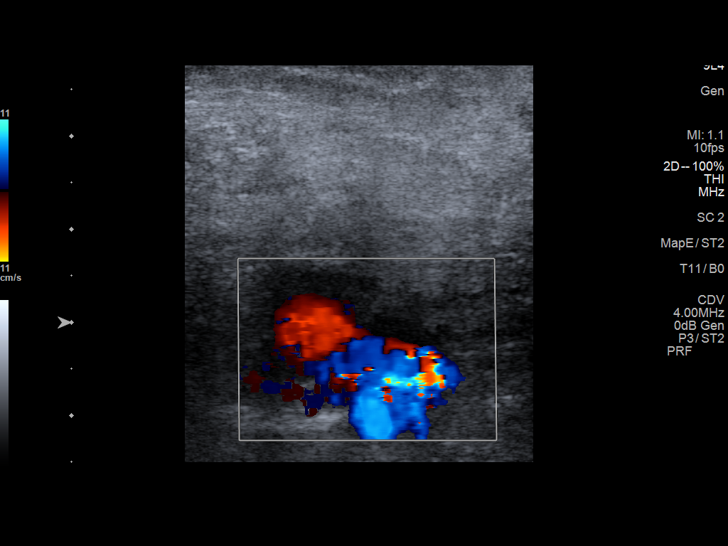
[im 3/16]
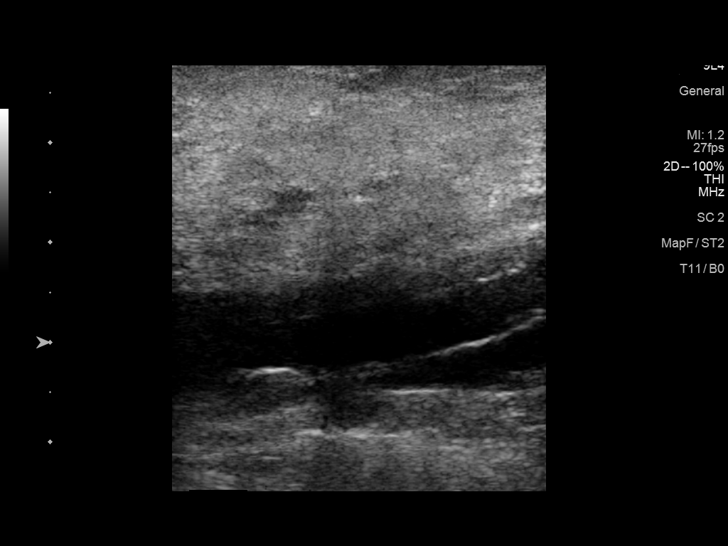
[im 5/16]
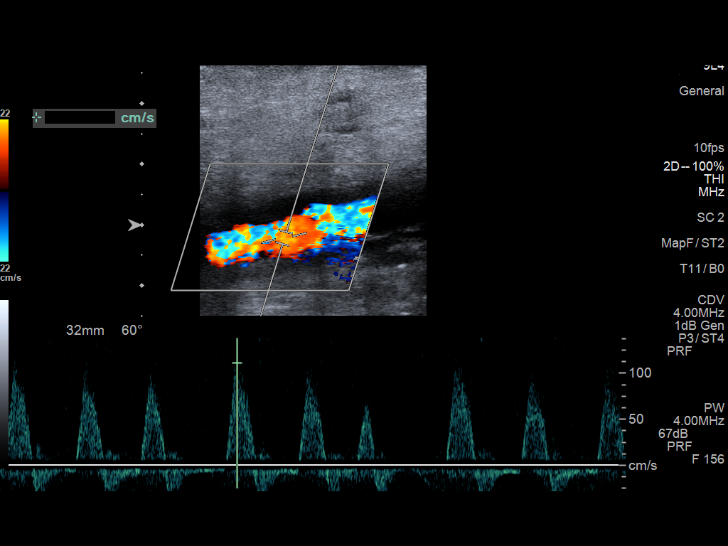
[im 6/16]
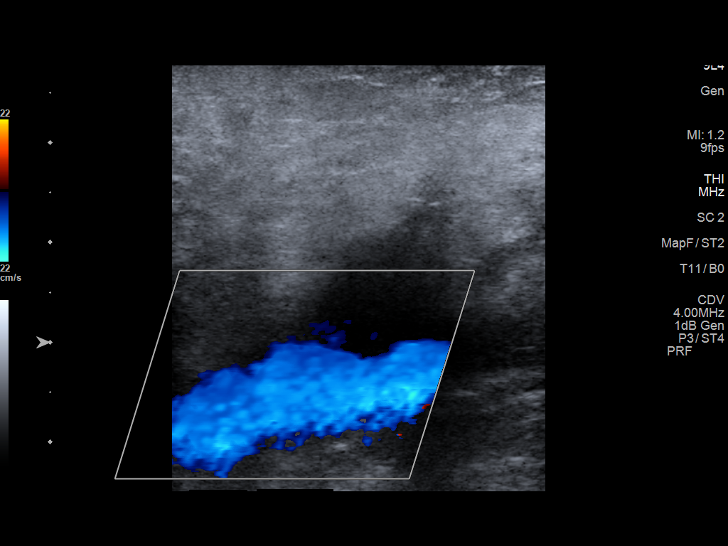
[im 7/16]
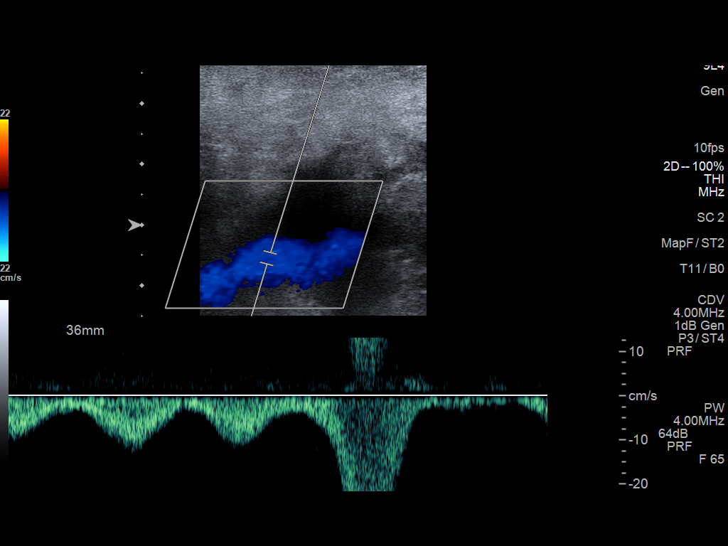
[im 8/16]
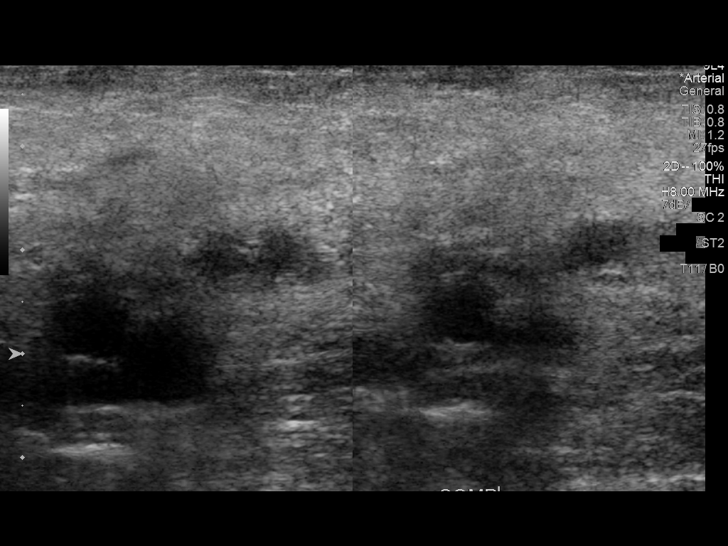
[im 9/16]
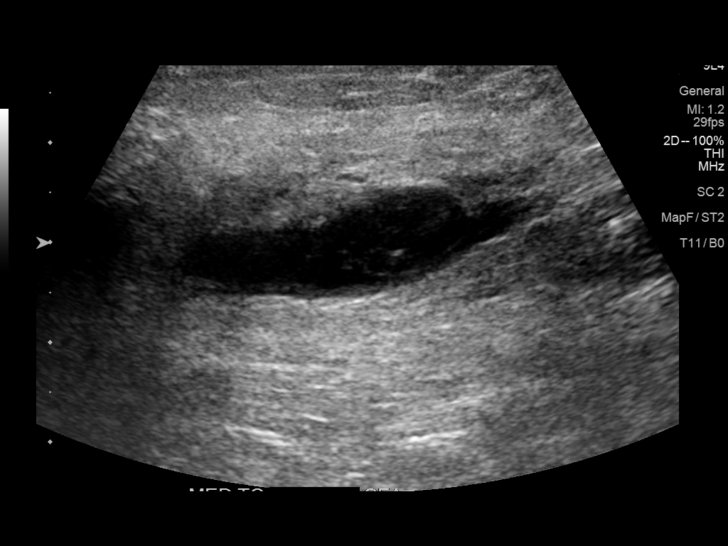
[im 10/16]
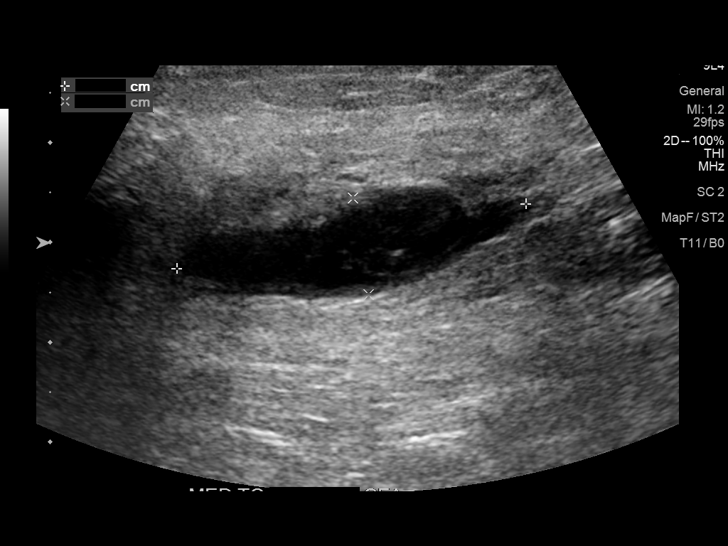
[im 11/16]
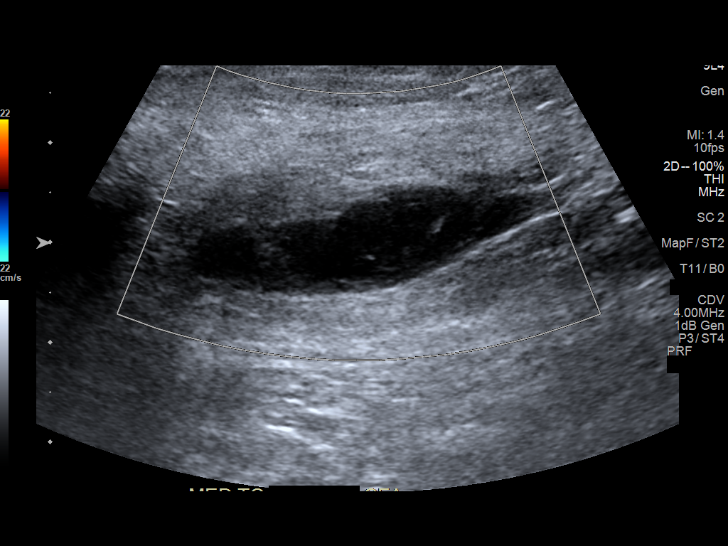
[im 13/16]
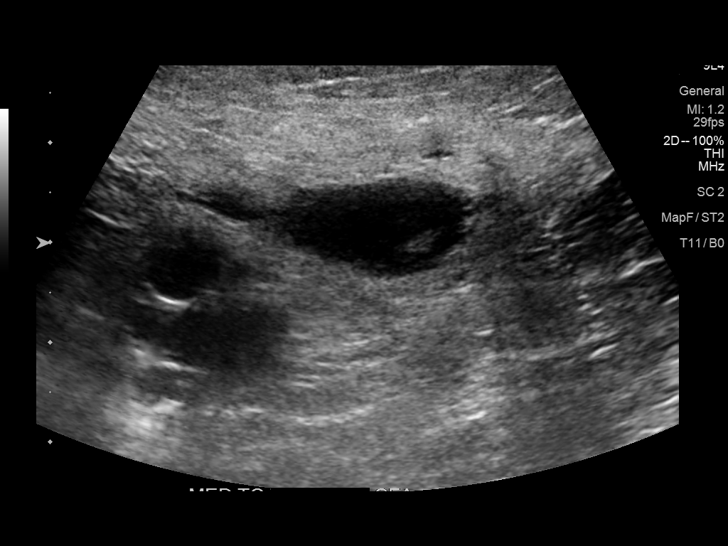
[im 14/16]
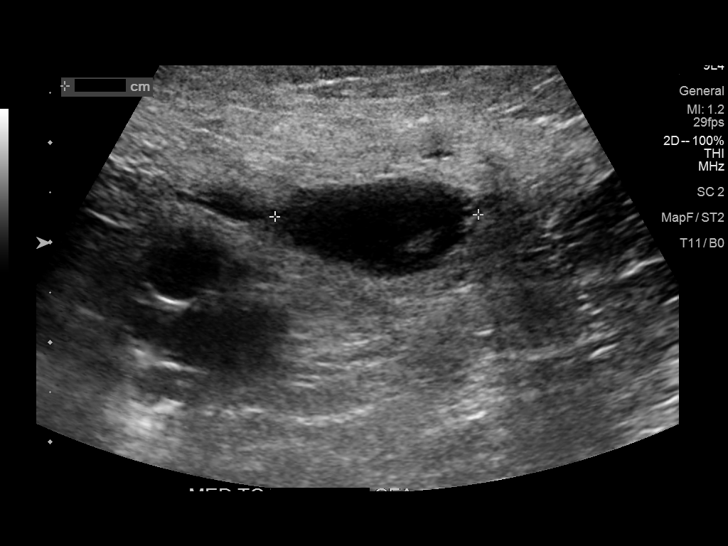
[im 15/16]
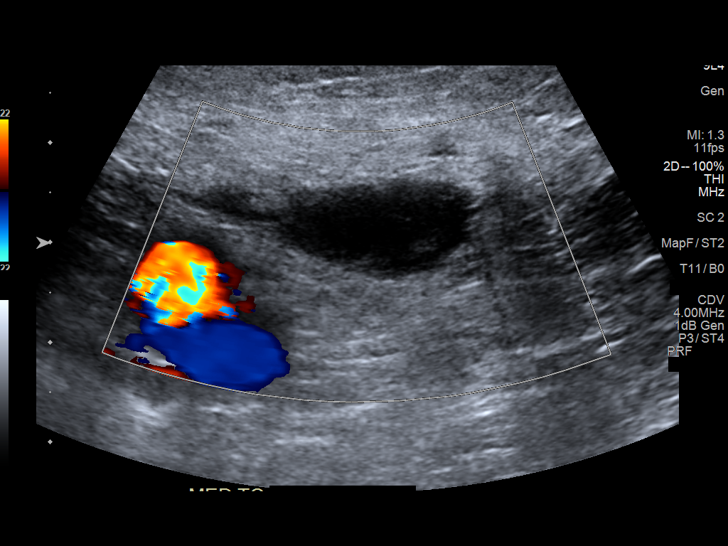
[im 16/16]
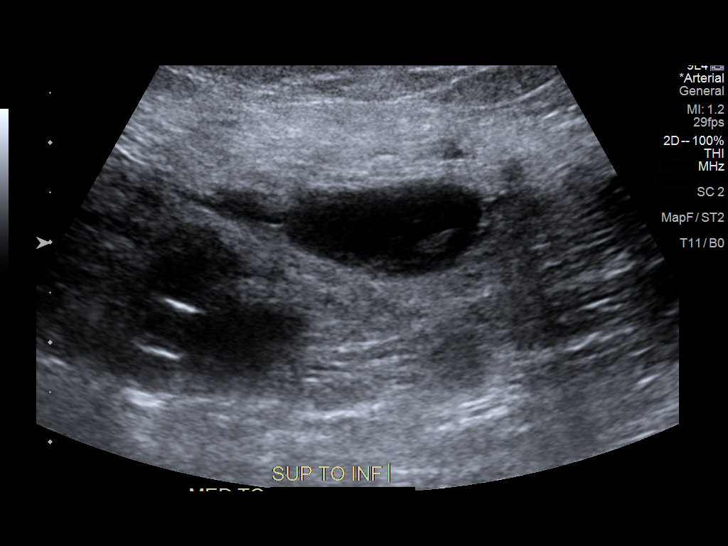

[14 of 16 positions shown; findings below may reference images not displayed]

FINDINGS: Triphasic waveform of the common femoral artery.

No pseudoaneurysm identified.

Lentiform hypoechoic fluid collection within the soft tissues
adjacent to the common femoral artery measures 3.6 cm x 1.0 cm x
cm.
IMPRESSION: Directed duplex of the right common femoral artery negative for
pseudoaneurysm.

Small fluid collection measuring 3.6 cm may represent a resolving
hematoma or seroma.

## 2020-09-08 ENCOUNTER — Other Ambulatory Visit: Payer: Self-pay | Admitting: Family Medicine

## 2020-09-08 DIAGNOSIS — Z8673 Personal history of transient ischemic attack (TIA), and cerebral infarction without residual deficits: Secondary | ICD-10-CM

## 2020-09-08 DIAGNOSIS — K219 Gastro-esophageal reflux disease without esophagitis: Secondary | ICD-10-CM

## 2020-09-10 NOTE — Telephone Encounter (Signed)
Pt calling to f/up on her request / meds not ready at pharmacy / please advise

## 2020-09-11 MED ORDER — PANTOPRAZOLE SODIUM 40 MG PO TBEC: 40.0000 mg | DELAYED_RELEASE_TABLET | Freq: Every day | ORAL | 1 refills | Status: DC

## 2020-09-11 NOTE — Addendum Note (Signed)
Addended by: Julieta Bellini on: 09/11/2020 09:53 AM   Modules accepted: Orders

## 2020-09-11 NOTE — Telephone Encounter (Signed)
Rx sent to pharmacy with refill.

## 2020-09-17 ENCOUNTER — Telehealth: Payer: Self-pay | Admitting: Cardiovascular Disease

## 2020-09-17 NOTE — Telephone Encounter (Signed)
Patient needing Entresto medication - says she will need [employer] patient assistance application completed  Please call to discuss

## 2020-09-17 NOTE — Telephone Encounter (Signed)
Return pt's call regarding her patient assistance application for Entresto, pt reports that she has received the application in the mail, will fill her portion out and will bring application to clinic to have Dr. Rockey Situ sign. Explain we will gladly fax completed application once MD signs and we will call or she should receive a letter in mail stating approval or not. Pt verbalized understanding and will call for further questions or concerns.

## 2020-09-18 NOTE — Telephone Encounter (Signed)
Patient brought in forms to be reviewed and completed  Placed in nurse box

## 2020-09-19 NOTE — Telephone Encounter (Signed)
Entresto medication pt assistance form complete, waiting on MD to sign, will fax to [employer]

## 2020-09-23 NOTE — Telephone Encounter (Signed)
Patient calling to check status of form completion and wants to be sure a medication list ins included as she thinks she forgot to print.

## 2020-09-23 NOTE — Telephone Encounter (Signed)
Called pt back to ensure her that her [name] assistance form was completed and faxed to [employer] 12/20 after Dr. Rockey Situ return signed form. Pt updated that waiting on [employer] to send approval or denial letter, may be next week before hearing anything. Pt is pleaseable and verbalized understanding. Will call back for anything further.

## 2020-09-24 DIAGNOSIS — Z872 Personal history of diseases of the skin and subcutaneous tissue: Secondary | ICD-10-CM | POA: Diagnosis not present

## 2020-09-24 DIAGNOSIS — L570 Actinic keratosis: Secondary | ICD-10-CM | POA: Diagnosis not present

## 2020-09-24 DIAGNOSIS — L578 Other skin changes due to chronic exposure to nonionizing radiation: Secondary | ICD-10-CM | POA: Diagnosis not present

## 2020-09-24 DIAGNOSIS — L821 Other seborrheic keratosis: Secondary | ICD-10-CM | POA: Diagnosis not present

## 2020-09-28 ENCOUNTER — Other Ambulatory Visit: Payer: Self-pay | Admitting: Family Medicine

## 2020-09-28 DIAGNOSIS — F3341 Major depressive disorder, recurrent, in partial remission: Secondary | ICD-10-CM

## 2020-09-28 DIAGNOSIS — F3289 Other specified depressive episodes: Secondary | ICD-10-CM

## 2020-09-30 ENCOUNTER — Other Ambulatory Visit: Payer: Self-pay

## 2020-09-30 MED ORDER — ENTRESTO 49-51 MG PO TABS
1.0000 | ORAL_TABLET | Freq: Two times a day (BID) | ORAL | 2 refills | Status: DC
Start: 2020-09-30 — End: 2021-06-02

## 2020-10-10 ENCOUNTER — Ambulatory Visit: Payer: Medicare Other | Admitting: Cardiovascular Disease

## 2020-10-13 NOTE — Telephone Encounter (Signed)
Received fax today from [employer] stating they are missing pt's proof of income along with pt assistance application. Spoke to pt, made her aware. Pt states she will bring in proof of income to the office so that we can fax to [employer] for her. I have placed pt's application in the designated file cabinet in the meantime.

## 2020-10-17 ENCOUNTER — Telehealth: Payer: Self-pay | Admitting: Cardiovascular Disease

## 2020-10-17 NOTE — Telephone Encounter (Signed)
Tried to call pt back, left message on VM to return call.

## 2020-10-17 NOTE — Telephone Encounter (Signed)
Patient calling for assistance for fax number.  She is going to send her income information to them today from the [employer].    Please call.

## 2020-10-22 ENCOUNTER — Other Ambulatory Visit: Payer: Medicare Other

## 2020-10-27 DIAGNOSIS — Z03818 Encounter for observation for suspected exposure to other biological agents ruled out: Secondary | ICD-10-CM | POA: Diagnosis not present

## 2020-10-29 NOTE — Telephone Encounter (Signed)
Left another VM for pt to call back if she is still needing assistance with fax number to have her income sent to the [facility] assistance program

## 2020-11-10 ENCOUNTER — Other Ambulatory Visit: Payer: Self-pay | Admitting: Family Medicine

## 2020-11-10 ENCOUNTER — Ambulatory Visit: Payer: Medicare Other | Admitting: Cardiovascular Disease

## 2020-11-10 DIAGNOSIS — G4700 Insomnia, unspecified: Secondary | ICD-10-CM

## 2020-11-10 DIAGNOSIS — K219 Gastro-esophageal reflux disease without esophagitis: Secondary | ICD-10-CM

## 2020-11-10 DIAGNOSIS — I10 Essential (primary) hypertension: Secondary | ICD-10-CM

## 2020-11-12 ENCOUNTER — Telehealth: Payer: Self-pay

## 2020-11-12 DIAGNOSIS — G4700 Insomnia, unspecified: Secondary | ICD-10-CM

## 2020-11-12 NOTE — Telephone Encounter (Signed)
Copied from [place] [phone]. Topic: General - Other >> Nov 12, 2020  5:00 PM Mcneil, Ja-Kwan wrote: Reason for CRM: Pt stated she was advised by [facility] that the Rx for amitriptyline (ELAVIL) 10 MG tablet had been discontinued. Pt would like to know why Rx was discontinued. Pt requests call back.

## 2020-11-13 ENCOUNTER — Telehealth: Payer: Self-pay | Admitting: Family Medicine

## 2020-11-13 DIAGNOSIS — G4700 Insomnia, unspecified: Secondary | ICD-10-CM

## 2020-11-13 NOTE — Telephone Encounter (Signed)
PT called back to check on the status of this medication, states she has been calling the Pharmacy and they still do not have it. Please advise.

## 2020-11-13 NOTE — Telephone Encounter (Signed)
Pt calling back to ask why her amitriptyline was dc'd?  Pt states she had enough to get her through last night

## 2020-11-13 NOTE — Telephone Encounter (Signed)
If patient states that she feels much better on it we can go back to the 10 mg just 1 tablet at bedtime of the amitriptyline

## 2020-11-14 ENCOUNTER — Other Ambulatory Visit: Payer: Self-pay | Admitting: Adult Health

## 2020-11-14 MED ORDER — AMITRIPTYLINE HCL 10 MG PO TABS: 10.0000 mg | ORAL_TABLET | Freq: Every day | ORAL | 1 refills | Status: DC

## 2020-11-14 NOTE — Telephone Encounter (Signed)
LMOVM for pt to return call 

## 2020-11-14 NOTE — Progress Notes (Signed)
Meds ordered this encounter  Medications  . amitriptyline (ELAVIL) 10 MG tablet    Sig: Take 1 tablet (10 mg total) by mouth at bedtime.    Dispense:  30 tablet    Refill:  1

## 2020-11-14 NOTE — Telephone Encounter (Signed)
Pt called back saying she really needs the Elavil to sleep .  She did not sleep last night at all.  She didn't know why Dr.Gilbert took her off of it but she really needs to go back on the medication.  She uses Paediatric nurse on [facility] [address].

## 2020-11-14 NOTE — Telephone Encounter (Signed)
Pt returned call/  I spoke to [facility] and she said to tell patient that they are going to get another provider to send the medication in.

## 2020-11-14 NOTE — Telephone Encounter (Addendum)
Nirmol, with pt's pharmacy called in for assistance. He says that he was told by pt that Rx for medication amitriptyline (ELAVIL) 10 MG tablet was sent in, showing denied. Pharmacy states they have not received Rx. Please assist further.   Pharmacy:  [place] ([place]), [place] - [address] Phone:  [phone]  Fax:  [phone]

## 2020-11-14 NOTE — Telephone Encounter (Signed)
Meds ordered this encounter  Medications   amitriptyline (ELAVIL) 10 MG tablet    Sig: Take 1 tablet (10 mg total) by mouth at bedtime.    Dispense:  30 tablet    Refill:  1   Sent in per Dr. Rosanna Randy, patient also has Remeron on her medication list.

## 2020-11-17 NOTE — Telephone Encounter (Signed)
Called to speak to pharmacy but they were closed.

## 2020-11-20 NOTE — Telephone Encounter (Signed)
Pharmacy did receive rx sent on 11/14/2020.

## 2020-11-24 NOTE — Progress Notes (Signed)
Cardiology Office Note  Date:  11/25/2020   ID:  ASPYNN CLOVER, DOB 09/10/1930, MRN [id number]  PCP:  Jerrol Banana., MD   Chief Complaint  Patient presents with   Follow-up    3 Months follow up and c/o tremors getting worse. Medications verbally reviewed with patient.     HPI:  [name] is a 87 y.o. female with history of  demand ischemia with hypertensive urgency,  LBBB,  TIA/CVA in 2016,  CKD stage II,  HTN,  HLD,  carotid artery disease,  hypothyroidism,  anxiety, depression,  Coronary disease, stent placed to proximal to mid LAD  ejection fraction 35 to 40%, repeat 09/2019: 40 to 45% Who presents for follow-up of her ischemic cardiomyopathy  Last seen in clinic October 2021 Sedentary at that time, good blood pressure No regular exercise, does her ADLs  Denies any anginal symptoms, Taking Entresto 49/51 mg p.o. twice daily BP at home 409 systolic, 60 to 70 diastolic  Biggest complaint is her chronic Insomnia, managed by PMD Tried amitriptyline, trouble getting a refill Has not tried other medications, awake at 2 AM  No SOB, no edema, no PND, orthopenea  Other past medical history reviewed hospital September 2020, ejection fraction 35 to 40% She had symptoms of chest pain and accelerated HTN found to have elevated high sensitivity troponin peaking at 811 and acute systolic CHF.    cardiac catheterization, with severe proximal to mid LAD disease, stent placed  Follow-up echocardiogram December 2020 ejection fraction 40 up to 45% Tolerating Entresto, carvedilol, HCTZ Reports blood pressure stable at home   PMH:   has a past medical history of Anemia, Diverticulitis, GERD (gastroesophageal reflux disease), Hyperlipidemia, Hypertension, Hypothyroidism, Myocardial infarction ([place]) (08/2017), TIA (transient ischemic attack), Vertigo, and [name].  PSH:    Past Surgical History:  Procedure Laterality Date   COLONOSCOPY  WITH PROPOFOL N/A 06/22/2018   Procedure: COLONOSCOPY WITH PROPOFOL;  Surgeon: Jonathon Bellows, MD;  Location: [facility] ENDOSCOPY;  Service: Endoscopy;  Laterality: N/A;   CORONARY STENT INTERVENTION N/A 06/07/2019   Procedure: CORONARY STENT INTERVENTION;  Surgeon: [name], MD;  Location: [facility] CV LAB;  Service: Cardiovascular;  Laterality: N/A;   ESOPHAGOGASTRODUODENOSCOPY (EGD) WITH PROPOFOL N/A 06/22/2018   Procedure: ESOPHAGOGASTRODUODENOSCOPY (EGD) WITH PROPOFOL;  Surgeon: Jonathon Bellows, MD;  Location: [facility] ENDOSCOPY;  Service: Endoscopy;  Laterality: N/A;   KNEE ARTHROSCOPY Right    REPLACEMENT TOTAL KNEE BILATERAL     RIGHT/LEFT HEART CATH AND CORONARY ANGIOGRAPHY N/A 06/07/2019   Procedure: RIGHT/LEFT HEART CATH AND CORONARY ANGIOGRAPHY;  Surgeon: [name], MD;  Location: [facility] LAB;  Service: Cardiovascular;  Laterality: N/A;   SHOULDER SURGERY Left     Current Outpatient Medications  Medication Sig Dispense Refill   acetaminophen (TYLENOL) 325 MG tablet Take 2 tablets (650 mg total) by mouth every 6 (six) hours as needed for mild pain or headache.     albuterol (PROVENTIL) (2.5 MG/3ML) 0.083% nebulizer solution Take 3 mLs (2.5 mg total) by nebulization every 4 (four) hours as needed. 75 mL 3   allopurinol (ZYLOPRIM) 300 MG tablet Take 1 tablet by mouth once daily 90 tablet 3   amitriptyline (ELAVIL) 10 MG tablet Take 1 tablet (10 mg total) by mouth at bedtime. 30 tablet 1   aspirin EC 81 MG EC tablet Take 1 tablet (81 mg total) by mouth daily.     atorvastatin (LIPITOR) 40 MG tablet Take 1  tablet by mouth once daily 90 tablet 3   carvedilol (COREG) 12.5 MG tablet Take 1 tablet by mouth twice daily 60 tablet 12   clopidogrel (PLAVIX) 75 MG tablet Take 1 tablet by mouth once daily 90 tablet 1   fluticasone (FLONASE) 50 MCG/ACT nasal spray Use 2 spray(s) in each nostril once daily 48 g 3   hydrochlorothiazide (HYDRODIURIL) 25 MG tablet Take 1  tablet by mouth once daily 90 tablet 0   levothyroxine (SYNTHROID) 137 MCG tablet Take 1 tablet by mouth once daily 90 tablet 3   mirtazapine (REMERON) 30 MG tablet TAKE 1 TABLET BY MOUTH AT BEDTIME 90 tablet 0   mometasone (ELOCON) 0.1 % cream Apply 1 application topically daily. 45 g 0   Multiple Vitamin (MULTIVITAMIN) capsule Take 1 capsule by mouth daily.     nitroGLYCERIN (NITROSTAT) 0.4 MG SL tablet Place 1 tablet (0.4 mg total) under the tongue every 5 (five) minutes as needed for chest pain. 25 tablet 3   pantoprazole (PROTONIX) 40 MG tablet Take 1 tablet (40 mg total) by mouth daily. 90 tablet 1   sacubitril-valsartan (ENTRESTO) 49-51 MG Take 1 tablet by mouth 2 (two) times daily. 180 tablet 2   sertraline (ZOLOFT) 25 MG tablet Take 1 tablet by mouth once daily 90 tablet 0   No current facility-administered medications for this visit.    Allergies:   Latex, Levofloxacin, Lisinopril, Povidone-iodine, Simvastatin, and Codeine   Social History:  The patient  reports that she has never smoked. She has never used smokeless tobacco. She reports that she does not drink alcohol and does not use drugs.   Family History:   family history includes Alzheimer's disease in her brother and brother; Breast cancer in her sister; Cancer in her brother; Clotting disorder in her daughter; Fibromyalgia in her daughter; Heart attack in her brother; Heart disease in her brother, brother, father, and mother; Hypertension in her mother; Mental illness in her brother; Stroke in her brother and mother.    Review of Systems: Review of Systems  Constitutional: Negative.   HENT: Negative.   Respiratory: Negative.   Cardiovascular: Negative.   Gastrointestinal: Negative.   Musculoskeletal: Negative.   Neurological: Positive for tremors.  Psychiatric/Behavioral: Negative.   All other systems reviewed and are negative.   PHYSICAL EXAM: VS:  BP 140/60 (BP Location: Right Arm, Patient Position:  Sitting, Cuff Size: Normal)    Pulse 70    Ht 5\' 2"  (1.575 m)    Wt 165 lb (74.8 kg)    SpO2 93%    BMI 30.18 kg/m  , BMI Body mass index is 30.18 kg/m. Constitutional:  oriented to person, place, and time. No distress.  HENT:  Head: Grossly normal Eyes:  no discharge. No scleral icterus.  Neck: No JVD, no carotid bruits  Cardiovascular: Regular rate and rhythm, no murmurs appreciated Pulmonary/Chest: Clear to auscultation bilaterally, no wheezes or rails Abdominal: Soft.  no distension.  no tenderness.  Musculoskeletal: Normal range of motion Neurological:  normal muscle tone. Coordination normal. No atrophy Skin: Skin warm and dry Psychiatric: normal affect, pleasant  Recent Labs: 05/19/2020: TSH 1.110 08/21/2020: ALT 19; BUN 19; Creatinine, Ser 1.11; Hemoglobin 11.6; Platelets 146; Potassium 4.4; Sodium 138   Lipid Panel Lab Results  Component Value Date   CHOL 115 03/13/2020   HDL 29 (L) 03/13/2020   LDLCALC 53 03/13/2020   TRIG 197 (H) 03/13/2020      Wt Readings from Last 3 Encounters:  11/25/20  165 lb (74.8 kg)  08/21/20 164 lb (74.4 kg)  07/07/20 160 lb 2 oz (72.6 kg)     ASSESSMENT AND PLAN:  Problem List Items Addressed This Visit      Cardiology Problems   Benign essential HTN    Other Visit Diagnoses    Chronic combined systolic and diastolic heart failure ([place])    -  Primary   Ischemic cardiomyopathy       Coronary artery disease involving native coronary artery of native heart without angina pectoris       Stage 3 chronic kidney disease, unspecified whether stage 3a or 3b CKD ([place])       Labile hypertension       Hyperlipidemia LDL goal <70       PAD (peripheral artery disease) ([facility])         Ischemic cardiomyopathy Tolerating Entresto, HCTZ, carvedilol Euvolemic, Fall risk No med changes made, will avoid hypotension  Chronic kidney disease stage III Good BMP, CR 1.11 No changes to meds  CAD with stable angina Stent placed to her proximal to  mid LAD Improved ejection fraction since that time  No angina  Carotid bruit Mild disease bilaterally, less than 39%  Hyperlipidemia On Lipitor 40 daily Cholesterol is at goal on the current lipid regimen. No changes to the medications were made.    Total encounter time more than 25 minutes  Greater than 50% was spent in counseling and coordination of care with the patient    Signed, Esmond Plants, M.D., Ph.D. [place], [place]

## 2020-11-25 ENCOUNTER — Encounter: Payer: Self-pay | Admitting: Cardiovascular Disease

## 2020-11-25 ENCOUNTER — Ambulatory Visit: Payer: Medicare Other | Admitting: Cardiovascular Disease

## 2020-11-25 ENCOUNTER — Other Ambulatory Visit: Payer: Self-pay

## 2020-11-25 VITALS — BP 140/60 | HR 70 | Ht 62.0 in | Wt 165.0 lb

## 2020-11-25 DIAGNOSIS — N183 Chronic kidney disease, stage 3 unspecified: Secondary | ICD-10-CM | POA: Diagnosis not present

## 2020-11-25 DIAGNOSIS — I2510 Atherosclerotic heart disease of native coronary artery without angina pectoris: Secondary | ICD-10-CM

## 2020-11-25 DIAGNOSIS — I739 Peripheral vascular disease, unspecified: Secondary | ICD-10-CM | POA: Diagnosis not present

## 2020-11-25 DIAGNOSIS — I5042 Chronic combined systolic (congestive) and diastolic (congestive) heart failure: Secondary | ICD-10-CM | POA: Diagnosis not present

## 2020-11-25 DIAGNOSIS — I10 Essential (primary) hypertension: Secondary | ICD-10-CM

## 2020-11-25 DIAGNOSIS — E785 Hyperlipidemia, unspecified: Secondary | ICD-10-CM

## 2020-11-25 DIAGNOSIS — I255 Ischemic cardiomyopathy: Secondary | ICD-10-CM

## 2020-11-25 DIAGNOSIS — R0989 Other specified symptoms and signs involving the circulatory and respiratory systems: Secondary | ICD-10-CM

## 2020-11-25 NOTE — Patient Instructions (Signed)
Medication Instructions:  No changes  If you need a refill on your cardiac medications before your next appointment, please call your pharmacy.    Lab work: No new labs needed   If you have labs (blood work) drawn today and your tests are completely normal, you will receive your results only by: . MyChart Message (if you have MyChart) OR . A paper copy in the mail If you have any lab test that is abnormal or we need to change your treatment, we will call you to review the results.   Testing/Procedures: No new testing needed   Follow-Up: At [facility], you and your health needs are our priority.  As part of our continuing mission to provide you with exceptional heart care, we have created designated Provider Care Teams.  These Care Teams include your primary Cardiologist (physician) and Advanced Practice Providers (APPs -  Physician Assistants and Nurse Practitioners) who all work together to provide you with the care you need, when you need it.  . You will need a follow up appointment in 6 months  . Providers on your designated Care Team:   . Christopher Berge, NP . Ryan Dunn, PA-C . Jacquelyn Visser, PA-C  Any Other Special Instructions Will Be Listed Below (If Applicable).  COVID-19 Vaccine Information can be found at: [email or web address] For questions related to vaccine distribution or appointments, please email [email or web address] or call [phone].     

## 2020-11-26 ENCOUNTER — Telehealth: Payer: Self-pay | Admitting: Cardiovascular Disease

## 2020-11-26 NOTE — Telephone Encounter (Signed)
Pt c/o medication issue:  1. Name of Medication: sleep aid   2. How are you currently taking this medication (dosage and times per day)? Please advuse   3. Are you having a reaction (difficulty breathing--STAT)? No   4. What is your medication issue? Patient saw Gollan yesterday and he suggested adding sleep medication.  Patient told daughter to purchase . Please call to confirm the med discussed that was ok for sleep sominex .  Patient also takes melatonin and amitriptyline 10 mg po q hs   Please call

## 2020-11-26 NOTE — Telephone Encounter (Signed)
Return Mrs. Gomez's daughter's phone call, okay by DPR to speak with [name]. Advised that Dr. Rockey Situ spoke with Mrs. Hodzic about OTC sleep aides, but advised for her to reach out to PCP regarding sleep medications, if amitriptyline is not working, there are other prescription meds she can try. Advised that OTC antihistamine should be okay for pt to take, also educated that decrease daily naps, try to stay active up until bed time, avoid caffeine, chocolate, and sweets 2 hours before bed, and maybe try a warm glass of milk at bedtimes. [name] is grateful for advise, will let Mrs. Yeatman know and will try OTC meds. For anything further will call back.

## 2020-12-22 ENCOUNTER — Other Ambulatory Visit: Payer: Self-pay | Admitting: Family Medicine

## 2020-12-22 MED ORDER — AMITRIPTYLINE HCL 10 MG PO TABS: 10.0000 mg | ORAL_TABLET | Freq: Every day | ORAL | 1 refills | Status: DC

## 2020-12-22 NOTE — Telephone Encounter (Signed)
   Notes to clinic: Patient is looking for a increase on medication and would like to have multiple  refills Review for changes  Requested Prescriptions  Pending Prescriptions Disp Refills   amitriptyline (ELAVIL) 10 MG tablet 30 tablet 1    Sig: Take 1 tablet (10 mg total) by mouth at bedtime.      Psychiatry:  Antidepressants - Heterocyclics (TCAs) Passed - 12/22/2020  2:10 PM      Passed - Completed PHQ-2 or PHQ-9 in the last 360 days      Passed - Valid encounter within last 6 months    Recent Outpatient Visits           4 months ago Benign essential HTN   [facility] Jerrol Banana., MD   7 months ago Benign essential HTN   [facility] Jerrol Banana., MD   9 months ago LLQ abdominal pain   [facility]les Collet [place], [place]   1 year ago Non-ST elevation (NSTEMI) myocardial infarction [facility])   [facility] Jerrol Banana., MD   1 year ago Iron deficiency anemia due to chronic blood loss   [facility] Jerrol Banana., MD       Future Appointments             In 2 months Jerrol Banana., MD [facility], [place]   In 5 months Gollan, Kathlene November, MD [facility], [facility]

## 2020-12-22 NOTE — Telephone Encounter (Signed)
Medication: amitriptyline (ELAVIL) 10 MG tablet  Has the pt contacted their pharmacy? Yes but it is not time, but pt wants refills put on this Also does not know this is just a 10 mg.  Pt states she is not sleeping well on this  Amount. Pt would like to get this Rx figured out. This used to be 10 mg Preferred pharmacy:   [facility] (N), [place]  Please be advised refills may take up to 3 business days.  We ask that you follow up with your pharmacy.

## 2020-12-29 ENCOUNTER — Other Ambulatory Visit: Payer: Self-pay | Admitting: Adult Health

## 2020-12-29 ENCOUNTER — Other Ambulatory Visit: Payer: Self-pay | Admitting: Family Medicine

## 2020-12-29 DIAGNOSIS — F3289 Other specified depressive episodes: Secondary | ICD-10-CM

## 2021-01-01 ENCOUNTER — Telehealth: Payer: Self-pay | Admitting: Family Medicine

## 2021-01-01 DIAGNOSIS — R3989 Other symptoms and signs involving the genitourinary system: Secondary | ICD-10-CM

## 2021-01-01 MED ORDER — SULFAMETHOXAZOLE-TRIMETHOPRIM 800-160 MG PO TABS: 1.0000 | ORAL_TABLET | Freq: Two times a day (BID) | ORAL | 0 refills | Status: DC

## 2021-01-01 NOTE — Telephone Encounter (Signed)
Bactrim sent in for patient

## 2021-01-01 NOTE — Telephone Encounter (Signed)
Pt called stating that she has a UTI. There are no appts available between providers until 01/05/21. She is requesting to have medication sent in to help so that she does not have to go to a UC. Please advise .      [facility] (N), [place] - [place] ([place]) [place] [address]  Phone: [phone] Fax: [phone]  Hours: Not open 24 hours

## 2021-01-01 NOTE — Telephone Encounter (Signed)
Tried calling; no answer.   Thanks,   -Pearlie Lafosse  

## 2021-01-12 ENCOUNTER — Other Ambulatory Visit: Payer: Self-pay | Admitting: Family Medicine

## 2021-01-12 DIAGNOSIS — F3341 Major depressive disorder, recurrent, in partial remission: Secondary | ICD-10-CM

## 2021-01-12 DIAGNOSIS — I10 Essential (primary) hypertension: Secondary | ICD-10-CM

## 2021-01-14 ENCOUNTER — Other Ambulatory Visit: Payer: Self-pay | Admitting: Family Medicine

## 2021-01-14 DIAGNOSIS — I10 Essential (primary) hypertension: Secondary | ICD-10-CM

## 2021-02-05 DIAGNOSIS — L570 Actinic keratosis: Secondary | ICD-10-CM | POA: Diagnosis not present

## 2021-02-05 DIAGNOSIS — L218 Other seborrheic dermatitis: Secondary | ICD-10-CM | POA: Diagnosis not present

## 2021-03-05 ENCOUNTER — Ambulatory Visit: Payer: Self-pay | Admitting: *Deleted

## 2021-03-05 NOTE — Telephone Encounter (Signed)
Pt's daughter calling, pt present. Reports positive covid, tested today. Attended funeral over weekend, states several family members have now tested positive. Pt's symptoms began Tuesday: headache, body aches, no fever "But we've been giving her tylenol around the clock for her headache and body aches." Reports severe dry cough "But chest sounds rattling." States headache somewhat better. "Extreme fatigue, in bed all day, weak." Reports SOB at rest and with speaking. Advised ED, states will follow disposition. Assured pt NT would route to practice for PCPs review.   Reason for Disposition . MODERATE difficulty breathing (e.g., speaks in phrases, SOB even at rest, pulse 100-120)  Answer Assessment - Initial Assessment Questions 1. COVID-19 DIAGNOSIS: "Who made your COVID-19 diagnosis?" "Was it confirmed by a positive lab test or self-test?" If not diagnosed by a doctor (or NP/PA), ask "Are there lots of cases (community spread) where you live?" Note: See public health department website, if unsure.     Home test, today 2. COVID-19 EXPOSURE: "Was there any known exposure to COVID before the symptoms began?" CDC Definition of close contact: within 6 feet (2 meters) for a total of 15 minutes or more over a 24-hour period.      At funeral, several family members positive 3. ONSET: "When did the COVID-19 symptoms start?"      Tuesday, cough worse Wednesday 4. WORST SYMPTOM: "What is your worst symptom?" (e.g., cough, fever, shortness of breath, muscle aches)     SOB at rest 5. COUGH: "Do you have a cough?" If Yes, ask: "How bad is the cough?"       Dry cough 6. FEVER: "Do you have a fever?" If Yes, ask: "What is your temperature, how was it measured, and when did it start?"     "No but we've been giving her tylenol for body aches. 7. RESPIRATORY STATUS: "Describe your breathing?" (e.g., shortness of breath, wheezing, unable to speak)      SOB at rest, with speaking 8. BETTER-SAME-WORSE: "Are you  getting better, staying the same or getting worse compared to yesterday?"  If getting worse, ask, "In what way?"     worse 9. HIGH RISK DISEASE: "Do you have any chronic medical problems?" (e.g., asthma, heart or lung disease, weak immune system, obesity, etc.)     yes 10. VACCINE: "Have you had the COVID-19 vaccine?" If Yes, ask: "Which one, how many shots, when did you get it?"       *No Answer* 11. BOOSTER: "Have you received your COVID-19 booster?" If Yes, ask: "Which one and when did you get it?"       *No Answer*  13. OTHER SYMPTOMS: "Do you have any other symptoms?"  (e.g., chills, fatigue, headache, loss of smell or taste, muscle pain, sore throat)     Extreme fatigue, body aches  Protocols used: CORONAVIRUS (COVID-19) DIAGNOSED OR SUSPECTED-A-AH

## 2021-03-06 ENCOUNTER — Emergency Department: Payer: Medicare Other

## 2021-03-06 ENCOUNTER — Inpatient Hospital Stay
Admission: EM | Admit: 2021-03-06 | Discharge: 2021-03-10 | DRG: 291 | Disposition: A | Discharge status: Home Health | Payer: Medicare Other | Attending: Internal Medicine | Admitting: Internal Medicine

## 2021-03-06 ENCOUNTER — Other Ambulatory Visit: Payer: Self-pay

## 2021-03-06 DIAGNOSIS — I2510 Atherosclerotic heart disease of native coronary artery without angina pectoris: Secondary | ICD-10-CM | POA: Diagnosis present

## 2021-03-06 DIAGNOSIS — Z8249 Family history of ischemic heart disease and other diseases of the circulatory system: Secondary | ICD-10-CM | POA: Diagnosis not present

## 2021-03-06 DIAGNOSIS — D696 Thrombocytopenia, unspecified: Secondary | ICD-10-CM | POA: Diagnosis not present

## 2021-03-06 DIAGNOSIS — Z7982 Long term (current) use of aspirin: Secondary | ICD-10-CM

## 2021-03-06 DIAGNOSIS — I214 Non-ST elevation (NSTEMI) myocardial infarction: Secondary | ICD-10-CM | POA: Diagnosis not present

## 2021-03-06 DIAGNOSIS — E039 Hypothyroidism, unspecified: Secondary | ICD-10-CM

## 2021-03-06 DIAGNOSIS — N179 Acute kidney failure, unspecified: Secondary | ICD-10-CM | POA: Diagnosis not present

## 2021-03-06 DIAGNOSIS — M109 Gout, unspecified: Secondary | ICD-10-CM | POA: Diagnosis not present

## 2021-03-06 DIAGNOSIS — I10 Essential (primary) hypertension: Secondary | ICD-10-CM | POA: Diagnosis not present

## 2021-03-06 DIAGNOSIS — R001 Bradycardia, unspecified: Secondary | ICD-10-CM | POA: Diagnosis not present

## 2021-03-06 DIAGNOSIS — N184 Chronic kidney disease, stage 4 (severe): Secondary | ICD-10-CM | POA: Diagnosis present

## 2021-03-06 DIAGNOSIS — R778 Other specified abnormalities of plasma proteins: Secondary | ICD-10-CM

## 2021-03-06 DIAGNOSIS — I130 Hypertensive heart and chronic kidney disease with heart failure and stage 1 through stage 4 chronic kidney disease, or unspecified chronic kidney disease: Principal | ICD-10-CM | POA: Diagnosis present

## 2021-03-06 DIAGNOSIS — N1831 Chronic kidney disease, stage 3a: Secondary | ICD-10-CM

## 2021-03-06 DIAGNOSIS — K219 Gastro-esophageal reflux disease without esophagitis: Secondary | ICD-10-CM

## 2021-03-06 DIAGNOSIS — Z79890 Hormone replacement therapy (postmenopausal): Secondary | ICD-10-CM

## 2021-03-06 DIAGNOSIS — F3289 Other specified depressive episodes: Secondary | ICD-10-CM

## 2021-03-06 DIAGNOSIS — L89151 Pressure ulcer of sacral region, stage 1: Secondary | ICD-10-CM | POA: Diagnosis not present

## 2021-03-06 DIAGNOSIS — D6959 Other secondary thrombocytopenia: Secondary | ICD-10-CM | POA: Diagnosis not present

## 2021-03-06 DIAGNOSIS — I5043 Acute on chronic combined systolic (congestive) and diastolic (congestive) heart failure: Secondary | ICD-10-CM

## 2021-03-06 DIAGNOSIS — Z974 Presence of external hearing-aid: Secondary | ICD-10-CM | POA: Diagnosis not present

## 2021-03-06 DIAGNOSIS — Z8673 Personal history of transient ischemic attack (TIA), and cerebral infarction without residual deficits: Secondary | ICD-10-CM

## 2021-03-06 DIAGNOSIS — F3341 Major depressive disorder, recurrent, in partial remission: Secondary | ICD-10-CM | POA: Diagnosis not present

## 2021-03-06 DIAGNOSIS — N183 Chronic kidney disease, stage 3 unspecified: Secondary | ICD-10-CM | POA: Diagnosis present

## 2021-03-06 DIAGNOSIS — I252 Old myocardial infarction: Secondary | ICD-10-CM

## 2021-03-06 DIAGNOSIS — J810 Acute pulmonary edema: Secondary | ICD-10-CM

## 2021-03-06 DIAGNOSIS — Z818 Family history of other mental and behavioral disorders: Secondary | ICD-10-CM | POA: Diagnosis not present

## 2021-03-06 DIAGNOSIS — Z832 Family history of diseases of the blood and blood-forming organs and certain disorders involving the immune mechanism: Secondary | ICD-10-CM

## 2021-03-06 DIAGNOSIS — I248 Other forms of acute ischemic heart disease: Secondary | ICD-10-CM | POA: Diagnosis present

## 2021-03-06 DIAGNOSIS — Z803 Family history of malignant neoplasm of breast: Secondary | ICD-10-CM

## 2021-03-06 DIAGNOSIS — F32A Depression, unspecified: Secondary | ICD-10-CM | POA: Diagnosis present

## 2021-03-06 DIAGNOSIS — D631 Anemia in chronic kidney disease: Secondary | ICD-10-CM | POA: Diagnosis present

## 2021-03-06 DIAGNOSIS — Z823 Family history of stroke: Secondary | ICD-10-CM

## 2021-03-06 DIAGNOSIS — Z96653 Presence of artificial knee joint, bilateral: Secondary | ICD-10-CM | POA: Diagnosis present

## 2021-03-06 DIAGNOSIS — R0602 Shortness of breath: Secondary | ICD-10-CM | POA: Diagnosis not present

## 2021-03-06 DIAGNOSIS — Z820 Family history of epilepsy and other diseases of the nervous system: Secondary | ICD-10-CM | POA: Diagnosis not present

## 2021-03-06 DIAGNOSIS — R0902 Hypoxemia: Secondary | ICD-10-CM | POA: Diagnosis not present

## 2021-03-06 DIAGNOSIS — R7989 Other specified abnormal findings of blood chemistry: Secondary | ICD-10-CM | POA: Diagnosis present

## 2021-03-06 DIAGNOSIS — G459 Transient cerebral ischemic attack, unspecified: Secondary | ICD-10-CM

## 2021-03-06 DIAGNOSIS — Z743 Need for continuous supervision: Secondary | ICD-10-CM | POA: Diagnosis not present

## 2021-03-06 DIAGNOSIS — E785 Hyperlipidemia, unspecified: Secondary | ICD-10-CM | POA: Diagnosis present

## 2021-03-06 DIAGNOSIS — Z7902 Long term (current) use of antithrombotics/antiplatelets: Secondary | ICD-10-CM

## 2021-03-06 DIAGNOSIS — R6889 Other general symptoms and signs: Secondary | ICD-10-CM | POA: Diagnosis not present

## 2021-03-06 DIAGNOSIS — U071 COVID-19: Secondary | ICD-10-CM | POA: Diagnosis present

## 2021-03-06 DIAGNOSIS — T501X5A Adverse effect of loop [high-ceiling] diuretics, initial encounter: Secondary | ICD-10-CM | POA: Diagnosis present

## 2021-03-06 DIAGNOSIS — Z79899 Other long term (current) drug therapy: Secondary | ICD-10-CM

## 2021-03-06 DIAGNOSIS — E876 Hypokalemia: Secondary | ICD-10-CM | POA: Diagnosis not present

## 2021-03-06 LAB — CBC
HCT: 35.7 % — ABNORMAL LOW (ref 36.0–46.0)
Hemoglobin: 11.9 g/dL — ABNORMAL LOW (ref 12.0–15.0)
MCH: 31.7 pg (ref 26.0–34.0)
MCHC: 33.3 g/dL (ref 30.0–36.0)
MCV: 95.2 fL (ref 80.0–100.0)
Platelets: 116 10*3/uL — ABNORMAL LOW (ref 150–400)
RBC: 3.75 MIL/uL — ABNORMAL LOW (ref 3.87–5.11)
RDW: 14.5 % (ref 11.5–15.5)
WBC: 7.5 10*3/uL (ref 4.0–10.5)
nRBC: 0 % (ref 0.0–0.2)

## 2021-03-06 LAB — RESP PANEL BY RT-PCR (FLU A&B, COVID) ARPGX2
Influenza A by PCR: NEGATIVE
Influenza B by PCR: NEGATIVE
SARS Coronavirus 2 by RT PCR: POSITIVE — AB

## 2021-03-06 LAB — BASIC METABOLIC PANEL
Anion gap: 8 (ref 5–15)
BUN: 16 mg/dL (ref 8–23)
CO2: 23 mmol/L (ref 22–32)
Calcium: 8.6 mg/dL — ABNORMAL LOW (ref 8.9–10.3)
Chloride: 107 mmol/L (ref 98–111)
Creatinine, Ser: 1.09 mg/dL — ABNORMAL HIGH (ref 0.44–1.00)
GFR, Estimated: 49 mL/min — ABNORMAL LOW (ref >60)
Glucose, Bld: 120 mg/dL — ABNORMAL HIGH (ref 70–99)
Potassium: 3.7 mmol/L (ref 3.5–5.1)
Sodium: 138 mmol/L (ref 135–145)

## 2021-03-06 LAB — D-DIMER, QUANTITATIVE: D-Dimer, Quant: 1.71 ug/mL-FEU — ABNORMAL HIGH (ref 0.00–0.50)

## 2021-03-06 LAB — PROCALCITONIN: Procalcitonin: <0.1 ng/mL

## 2021-03-06 LAB — BRAIN NATRIURETIC PEPTIDE: B Natriuretic Peptide: 960.1 pg/mL — ABNORMAL HIGH (ref 0.0–100.0)

## 2021-03-06 LAB — TROPONIN I (HIGH SENSITIVITY)
Troponin I (High Sensitivity): 45 ng/L — ABNORMAL HIGH (ref <18)
Troponin I (High Sensitivity): 53 ng/L — ABNORMAL HIGH (ref <18)
Troponin I (High Sensitivity): 74 ng/L — ABNORMAL HIGH (ref <18)
Troponin I (High Sensitivity): 63 ng/L — ABNORMAL HIGH (ref <18)

## 2021-03-06 LAB — LACTATE DEHYDROGENASE: LDH: 150 U/L (ref 98–192)

## 2021-03-06 LAB — SAVE SMEAR(SSMR), FOR PROVIDER SLIDE REVIEW

## 2021-03-06 MED ORDER — FUROSEMIDE 10 MG/ML IJ SOLN
40.0000 mg | Freq: Once | INTRAMUSCULAR | Status: AC
  Administered 2021-03-06: 40 mg via INTRAVENOUS
  Filled 2021-03-06: qty 4

## 2021-03-06 MED ORDER — HYDRALAZINE HCL 20 MG/ML IJ SOLN
5.0000 mg | INTRAMUSCULAR | Status: DC | PRN
  Administered 2021-03-06 (×2): 5 mg via INTRAVENOUS
  Filled 2021-03-06 (×4): qty 1

## 2021-03-06 MED ORDER — ATORVASTATIN CALCIUM 20 MG PO TABS
40.0000 mg | ORAL_TABLET | Freq: Every evening | ORAL | Status: DC
  Administered 2021-03-06 – 2021-03-09 (×4): 40 mg via ORAL
  Filled 2021-03-06 (×4): qty 2

## 2021-03-06 MED ORDER — ADULT MULTIVITAMIN W/MINERALS CH
1.0000 | ORAL_TABLET | Freq: Every evening | ORAL | Status: DC
  Administered 2021-03-07 – 2021-03-09 (×3): 1 via ORAL
  Filled 2021-03-06 (×3): qty 1

## 2021-03-06 MED ORDER — PANTOPRAZOLE SODIUM 40 MG PO TBEC
40.0000 mg | DELAYED_RELEASE_TABLET | Freq: Every day | ORAL | Status: DC
  Administered 2021-03-07 – 2021-03-10 (×4): 40 mg via ORAL
  Filled 2021-03-06 (×4): qty 1

## 2021-03-06 MED ORDER — FERROUS SULFATE 220 (44 FE) MG/5ML PO ELIX
27.0000 mg | ORAL_SOLUTION | Freq: Every day | ORAL | Status: DC
  Administered 2021-03-07 – 2021-03-09 (×4): 27 mg via ORAL
  Filled 2021-03-06: qty 0.61
  Filled 2021-03-06: qty 0.7
  Filled 2021-03-06 (×2): qty 0.61
  Filled 2021-03-06: qty 0.7

## 2021-03-06 MED ORDER — SACUBITRIL-VALSARTAN 49-51 MG PO TABS
1.0000 | ORAL_TABLET | Freq: Two times a day (BID) | ORAL | Status: DC
  Administered 2021-03-06 – 2021-03-10 (×8): 1 via ORAL
  Filled 2021-03-06 (×8): qty 1

## 2021-03-06 MED ORDER — SODIUM CHLORIDE 0.9 % IV SOLN
100.0000 mg | Freq: Every day | INTRAVENOUS | Status: AC
  Administered 2021-03-07 – 2021-03-10 (×4): 100 mg via INTRAVENOUS
  Filled 2021-03-06 (×4): qty 100

## 2021-03-06 MED ORDER — MIRTAZAPINE 15 MG PO TABS
30.0000 mg | ORAL_TABLET | Freq: Every day | ORAL | Status: DC
  Administered 2021-03-06 – 2021-03-09 (×4): 30 mg via ORAL
  Filled 2021-03-06 (×4): qty 2

## 2021-03-06 MED ORDER — ALBUTEROL SULFATE HFA 108 (90 BASE) MCG/ACT IN AERS
2.0000 | INHALATION_SPRAY | RESPIRATORY_TRACT | Status: DC | PRN
  Administered 2021-03-07 – 2021-03-09 (×3): 2 via RESPIRATORY_TRACT
  Filled 2021-03-06 (×2): qty 6.7

## 2021-03-06 MED ORDER — ALLOPURINOL 300 MG PO TABS
300.0000 mg | ORAL_TABLET | Freq: Every day | ORAL | Status: DC
  Administered 2021-03-07 – 2021-03-10 (×4): 300 mg via ORAL
  Filled 2021-03-06 (×4): qty 1

## 2021-03-06 MED ORDER — ACETAMINOPHEN 325 MG PO TABS: 650.0000 mg | ORAL_TABLET | Freq: Four times a day (QID) | ORAL | Status: DC | PRN

## 2021-03-06 MED ORDER — MELATONIN 5 MG PO TABS
10.0000 mg | ORAL_TABLET | Freq: Every day | ORAL | Status: DC
  Administered 2021-03-06 – 2021-03-09 (×4): 10 mg via ORAL
  Filled 2021-03-06 (×4): qty 2

## 2021-03-06 MED ORDER — ONDANSETRON HCL 4 MG/2ML IJ SOLN
4.0000 mg | Freq: Three times a day (TID) | INTRAMUSCULAR | Status: DC | PRN
  Filled 2021-03-06: qty 2

## 2021-03-06 MED ORDER — CLOPIDOGREL BISULFATE 75 MG PO TABS
75.0000 mg | ORAL_TABLET | Freq: Every day | ORAL | Status: DC
  Administered 2021-03-07 – 2021-03-10 (×4): 75 mg via ORAL
  Filled 2021-03-06 (×4): qty 1

## 2021-03-06 MED ORDER — DM-GUAIFENESIN ER 30-600 MG PO TB12
1.0000 | ORAL_TABLET | Freq: Two times a day (BID) | ORAL | Status: DC | PRN
  Administered 2021-03-06 – 2021-03-09 (×4): 1 via ORAL
  Filled 2021-03-06 (×4): qty 1

## 2021-03-06 MED ORDER — LEVOTHYROXINE SODIUM 137 MCG PO TABS
137.0000 ug | ORAL_TABLET | Freq: Every day | ORAL | Status: DC
  Administered 2021-03-07 – 2021-03-10 (×4): 137 ug via ORAL
  Filled 2021-03-06 (×4): qty 1

## 2021-03-06 MED ORDER — CARVEDILOL 12.5 MG PO TABS
12.5000 mg | ORAL_TABLET | Freq: Two times a day (BID) | ORAL | Status: DC
  Administered 2021-03-07 – 2021-03-10 (×8): 12.5 mg via ORAL
  Filled 2021-03-06 (×8): qty 1

## 2021-03-06 MED ORDER — SODIUM CHLORIDE 0.9 % IV SOLN
200.0000 mg | Freq: Once | INTRAVENOUS | Status: AC
  Administered 2021-03-06: 200 mg via INTRAVENOUS
  Filled 2021-03-06: qty 40

## 2021-03-06 MED ORDER — FUROSEMIDE 10 MG/ML IJ SOLN
40.0000 mg | Freq: Two times a day (BID) | INTRAMUSCULAR | Status: DC
  Administered 2021-03-07 – 2021-03-08 (×3): 40 mg via INTRAVENOUS
  Filled 2021-03-06 (×3): qty 4

## 2021-03-06 MED ORDER — LABETALOL HCL 5 MG/ML IV SOLN
5.0000 mg | Freq: Once | INTRAVENOUS | Status: AC
  Administered 2021-03-07: 5 mg via INTRAVENOUS
  Filled 2021-03-06: qty 4

## 2021-03-06 MED ORDER — ASPIRIN EC 81 MG PO TBEC
81.0000 mg | DELAYED_RELEASE_TABLET | Freq: Every day | ORAL | Status: DC
  Administered 2021-03-07 – 2021-03-10 (×4): 81 mg via ORAL
  Filled 2021-03-06 (×4): qty 1

## 2021-03-06 MED ORDER — SERTRALINE HCL 50 MG PO TABS
25.0000 mg | ORAL_TABLET | Freq: Every day | ORAL | Status: DC
  Administered 2021-03-07 – 2021-03-10 (×4): 25 mg via ORAL
  Filled 2021-03-06 (×4): qty 1

## 2021-03-06 MED ORDER — METHYLPREDNISOLONE SODIUM SUCC 40 MG IJ SOLR
40.0000 mg | Freq: Two times a day (BID) | INTRAMUSCULAR | Status: DC
  Administered 2021-03-06 – 2021-03-10 (×8): 40 mg via INTRAVENOUS
  Filled 2021-03-06 (×8): qty 1

## 2021-03-06 NOTE — ED Provider Notes (Signed)
[facility] Emergency Department Provider Note   ____________________________________________    I have reviewed the triage vital signs and the nursing notes.   HISTORY  Chief Complaint Shortness of Breath and Chest Pain     HPI Stephanie Charles is a 87 y.o. female with a history of CAD who presents with complaints of shortness of breath and chest tightness.  Patient reports worsening shortness of breath over the last 24 hours.  She reports this worsened when she laid down last night.  Denies fevers or chills, intermittent dry cough.  Past Medical History:  Diagnosis Date  . Anemia   . Diverticulitis   . GERD (gastroesophageal reflux disease)   . Hyperlipidemia   . Hypertension   . Hypothyroidism   . Myocardial infarction ([place]) 08/2017  . TIA (transient ischemic attack)    1 approx 2015, 1 approx 2018  . Vertigo    last episode several months ago  . [name]     Patient Active Problem List   Diagnosis Date Noted  . CHF exacerbation ([place]) 03/06/2021  . Unstable angina ([place]) 06/07/2019  . Non-ST elevation (NSTEMI) myocardial infarction ([place])   . Chest pain 06/06/2019  . 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 Diverticulitis 04/13/2018  . 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 11/12/2017  . Ankle edema, bilateral 05/04/2017  . Abnormal blood sugar 03/11/2015  . Allergic rhinitis 03/11/2015  . Absolute anemia 03/11/2015  . Arthropathia 03/11/2015  . Benign essential HTN 03/11/2015  . Benign essential tremor 03/11/2015  . Artery disease, cerebral 03/11/2015  . Chronic venous insufficiency 03/11/2015  . Clinical depression 03/11/2015  . Urinary system disease 03/11/2015  . Acid reflux 03/11/2015  . HLD (hyperlipidemia) 03/11/2015  . Adult hypothyroidism 03/11/2015  . Irritable colon 03/11/2015  . Cannot sleep 03/11/2015  . Weak pulse 03/11/2015  . Carotid stenosis 03/11/2015  .  Temporary cerebral vascular dysfunction 03/11/2015  . External carotid artery stenosis 07/12/2014    Past Surgical History:  Procedure Laterality Date  . COLONOSCOPY WITH PROPOFOL N/A 06/22/2018   Procedure: COLONOSCOPY WITH PROPOFOL;  Surgeon: Jonathon Bellows, MD;  Location: [facility] ENDOSCOPY;  Service: Endoscopy;  Laterality: N/A;  . CORONARY STENT INTERVENTION N/A 06/07/2019   Procedure: CORONARY STENT INTERVENTION;  Surgeon: [name], MD;  Location: [place] CV LAB;  Service: Cardiovascular;  Laterality: N/A;  . ESOPHAGOGASTRODUODENOSCOPY (EGD) WITH PROPOFOL N/A 06/22/2018   Procedure: ESOPHAGOGASTRODUODENOSCOPY (EGD) WITH PROPOFOL;  Surgeon: Jonathon Bellows, MD;  Location: [facility] ENDOSCOPY;  Service: Endoscopy;  Laterality: N/A;  . KNEE ARTHROSCOPY Right   . REPLACEMENT TOTAL KNEE BILATERAL    . RIGHT/LEFT HEART CATH AND CORONARY ANGIOGRAPHY N/A 06/07/2019   Procedure: RIGHT/LEFT HEART CATH AND CORONARY ANGIOGRAPHY;  Surgeon: [name], MD;  Location: [place] CV LAB;  Service: Cardiovascular;  Laterality: N/A;  . SHOULDER SURGERY Left     Prior to Admission medications   Medication Sig Start Date End Date Taking? Authorizing Provider  acetaminophen (TYLENOL) 325 MG tablet Take 2 tablets (650 mg total) by mouth every 6 (six) hours as needed for mild pain or headache. 06/08/19   Gouru, Illene Silver, MD  albuterol (PROVENTIL) (2.5 MG/3ML) 0.083% nebulizer solution Take 3 mLs (2.5 mg total) by nebulization every 4 (four) hours as needed. 05/16/19 07/07/20  Jerrol Banana., MD  allopurinol (ZYLOPRIM) 300 MG tablet Take 1 tablet by mouth once daily 07/28/20   Jerrol Banana.,  MD  amitriptyline (ELAVIL) 10 MG tablet Take 1 tablet (10 mg total) by mouth at bedtime. 12/22/20   Jerrol Banana., MD  aspirin EC 81 MG EC tablet Take 1 tablet (81 mg total) by mouth daily. 06/09/19   Nicholes Mango, MD  atorvastatin (LIPITOR) 40 MG tablet Take 1 tablet by mouth once daily 07/28/20    Jerrol Banana., MD  carvedilol (COREG) 12.5 MG tablet Take 1 tablet by mouth twice daily 07/01/20   Jerrol Banana., MD  clopidogrel (PLAVIX) 75 MG tablet Take 1 tablet by mouth once daily 09/08/20   Jerrol Banana., MD  fluticasone [facility]) 50 MCG/ACT nasal spray Use 2 spray(s) in each nostril once daily 09/29/19   Jerrol Banana., MD  hydrochlorothiazide (HYDRODIURIL) 25 MG tablet Take 1 tablet by mouth once daily 01/14/21   Jerrol Banana., MD  levothyroxine (SYNTHROID) 137 MCG tablet Take 1 tablet by mouth once daily 07/28/20   Jerrol Banana., MD  mirtazapine (REMERON) 30 MG tablet TAKE 1 TABLET BY MOUTH AT BEDTIME 01/12/21   Jerrol Banana., MD  mometasone (ELOCON) 0.1 % cream Apply 1 application topically daily. 06/12/19   Jerrol Banana., MD  Multiple Vitamin (MULTIVITAMIN) capsule Take 1 capsule by mouth daily.    [provider]  nitroGLYCERIN (NITROSTAT) 0.4 MG SL tablet Place 1 tablet (0.4 mg total) under the tongue every 5 (five) minutes as needed for chest pain. 06/25/19 09/23/19  Marrianne Mood D, PA-C  pantoprazole (PROTONIX) 40 MG tablet Take 1 tablet (40 mg total) by mouth daily. 09/11/20   Jerrol Banana., MD  sacubitril-valsartan (ENTRESTO) 49-51 MG Take 1 tablet by mouth 2 (two) times daily. 09/30/20   [name], MD  sertraline (ZOLOFT) 25 MG tablet Take 1 tablet by mouth once daily 12/29/20   Jerrol Banana., MD  sulfamethoxazole-trimethoprim (BACTRIM DS) 800-160 MG tablet Take 1 tablet by mouth 2 (two) times daily. 01/01/21   Mar Daring, PA-C     Allergies Latex, Levofloxacin, Lisinopril, Povidone-iodine, Simvastatin, and Codeine  Family History  Problem Relation Age of Onset  . Heart disease Mother   . Hypertension Mother   . Stroke Mother   . Heart disease Father   . Breast cancer Sister   . Alzheimer's disease Brother   . Heart attack Brother   . Stroke Brother   . Heart  disease Brother   . Clotting disorder Daughter   . Mental illness Brother        died suicide  . Cancer Brother        prostate  . Heart disease Brother        atrial fib  . Alzheimer's disease Brother   . Fibromyalgia Daughter     Social History Social History   Tobacco Use  . Smoking status: Never Smoker  . Smokeless tobacco: Never Used  Vaping Use  . Vaping Use: Never used  Substance Use Topics  . Alcohol use: No    Alcohol/week: 0.0 standard drinks  . Drug use: No    Review of Systems  Constitutional: No fever/chills Eyes: No visual changes.  ENT: No sore throat. Cardiovascular: As above Respiratory: As above. Gastrointestinal: No abdominal pain.  No nausea, no vomiting.   Genitourinary: Negative for dysuria. Musculoskeletal: Negative for back pain. Skin: Negative for rash. Neurological: Negative for headaches or weakness   ____________________________________________   PHYSICAL EXAM:  VITAL  SIGNS: ED Triage Vitals  Enc Vitals Group     BP 03/06/21 1028 (!) 171/55     Pulse Rate 03/06/21 1028 74     Resp 03/06/21 1028 17     Temp 03/06/21 1028 98.4 F (36.9 C)     Temp Source 03/06/21 1028 Oral     SpO2 03/06/21 1028 93 %     Weight 03/06/21 1026 74 kg (163 lb 2.3 oz)     Height 03/06/21 1026 1.575 m (5\' 2" )     Head Circumference --      Peak Flow --      Pain Score 03/06/21 1030 8     Pain Loc --      Pain Edu? --      Excl. in [place]? --     Constitutional: Alert and oriented.  . Nose: No congestion/rhinnorhea. Mouth/Throat: Mucous membranes are moist.   Neck:  Painless ROM Cardiovascular: Normal rate, regular rhythm. Grossly normal heart sounds.  Good peripheral circulation. Respiratory: Increased respiratory effort with mild tachypnea no retractions.  Bibasilar Rales Gastrointestinal: Soft and nontender. No distention.  Musculoskeletal: No lower extremity tenderness nor edema.  Warm and well perfused Neurologic:  Normal speech and  language. No gross focal neurologic deficits are appreciated.  Skin:  Skin is warm, dry and intact. No rash noted. Psychiatric: Mood and affect are normal. Speech and behavior are normal.  ____________________________________________   LABS (all labs ordered are listed, but only abnormal results are displayed)  Labs Reviewed  BASIC METABOLIC PANEL - Abnormal; Notable for the following components:      Result Value   Glucose, Bld 120 (*)    Creatinine, Ser 1.09 (*)    Calcium 8.6 (*)    GFR, Estimated 49 (*)    All other components within normal limits  CBC - Abnormal; Notable for the following components:   RBC 3.75 (*)    Hemoglobin 11.9 (*)    HCT 35.7 (*)    Platelets 116 (*)    All other components within normal limits  TROPONIN I (HIGH SENSITIVITY) - Abnormal; Notable for the following components:   Troponin I (High Sensitivity) 45 (*)    All other components within normal limits  RESP PANEL BY RT-PCR (FLU A&B, COVID) ARPGX2  CULTURE, BLOOD (ROUTINE X 2)  CULTURE, BLOOD (ROUTINE X 2)  TROPONIN I (HIGH SENSITIVITY)   ____________________________________________  EKG  ED ECG REPORT I, Lavonia Drafts, the attending physician, personally viewed and interpreted this ECG.  Date: 03/06/2021  Rhythm: normal sinus rhythm QRS Axis: normal Intervals left bundle branch block ST/T Wave abnormalities: normal Narrative Interpretation: no evidence of acute ischemia  ____________________________________________  RADIOLOGY  Chest x-ray viewed by me most consistent with pulmonary edema ____________________________________________   PROCEDURES  Procedure(s) performed: No  Procedures   Critical Care performed: No ____________________________________________   INITIAL IMPRESSION / ASSESSMENT AND PLAN / ED COURSE  Pertinent labs & imaging results that were available during my care of the patient were reviewed by me and considered in my medical decision making (see  chart for details).  Patient presents with shortness of breath and chest discomfort as detailed above.  Lab work is notable for elevated troponin of 45, EKG demonstrates left bundle branch block however the patient does not have active chest pain at this time.  White blood cell count is normal  Chest x-ray is consistent with edema, this is likely the cause of her shortness of breath and chest discomfort.  We will treat with IV Lasix and admit to the hospitalist service    ____________________________________________   FINAL CLINICAL IMPRESSION(S) / ED DIAGNOSES  Final diagnoses:  Acute pulmonary edema ([place])  Shortness of breath        Note:  This document was prepared using Dragon voice recognition software and may include unintentional dictation errors.   Lavonia Drafts, MD 03/06/21 1240

## 2021-03-06 NOTE — Consult Note (Signed)
Remdesivir - Pharmacy Brief Note   O:  ALT: ordered for tomorrow CXR: 03/06/21 Diffuse interstitial infiltrates, SpO2: 92% on RA   A/P:  Remdesivir 200 mg IVPB once followed by 100 mg IVPB daily x 4 days.   Dorothe Pea, PharmD, BCPS Clinical Pharmacist  03/06/2021 4:22 PM

## 2021-03-06 NOTE — Telephone Encounter (Signed)
Just an FYI-patient at the ED and a Dr.Gilbert patient.

## 2021-03-06 NOTE — H&P (Signed)
History and Physical    Stephanie Charles TKZ:[id number] DOB: [birth year] DOA: 03/06/2021  Referring MD/NP/PA:   PCP: Jerrol Banana., MD   Patient coming from:  The patient is coming from home.   Chief Complaint: Shortness of breath  HPI: Stephanie Charles is a 87 y.o. female with medical history significant of hypertension, hyperlipidemia, TIA, GERD, hypothyroidism, gout, depression, CAD, stent placement, anemia, vertigo, CHF with EF of 40-45%, CKD stage IIIa, who presents with shortness of breath.  Patient states that she started having shortness of breath since yesterday, which has been progressively worsening.  She has dry cough, chest tightness, but denies active chest pain.  Patient states that she has orthopnea, her shortness breath is worse when laying flat.  Patient states that she had a nausea and 1 loose stool bowel movement last night, which is resolved.  Currently denies nausea vomiting, diarrhea or abdominal pain.  No symptoms of UTI.   ED Course: pt was found to have WBC 7.5, positive COVID-19 PCR, troponin level 45, 53, BNP 960, renal function close to baseline, temperature normal, blood pressure 171/55, heart rate 74, RR 21, oxygen saturation 92-95% on room air.  Chest x-ray showed diffuse interstitial edema.  Patient is admitted to progressive bed as inpatient.  Review of Systems:   General: no fevers, chills, no body weight gain, has fatigue HEENT: no blurry vision, hearing changes or sore throat Respiratory: has dyspnea, coughing, no wheezing CV: has chest tightness, no palpitations GI: currently no nausea, vomiting, abdominal pain, diarrhea, constipation GU: no dysuria, burning on urination, increased urinary frequency, hematuria  Ext: has leg edema Neuro: no unilateral weakness, numbness, or tingling, no vision change or hearing loss Skin: no rash, no skin tear. MSK: No muscle spasm, no deformity, no limitation of range of movement in spin Heme: No easy  bruising.  Travel history: No recent long distant travel.  Allergy:  Allergies  Allergen Reactions  . Latex Itching  . Levofloxacin     Mouth sores  . Lisinopril     Cough?  . Povidone-Iodine Other (See Comments)    Severe blistering and itchiness. Severe redness  . Simvastatin     Myalgias  . Codeine Nausea And Vomiting    Past Medical History:  Diagnosis Date  . Anemia   . Diverticulitis   . GERD (gastroesophageal reflux disease)   . Hyperlipidemia   . Hypertension   . Hypothyroidism   . Myocardial infarction ([place]) 08/2017  . TIA (transient ischemic attack)    1 approx 2015, 1 approx 2018  . Vertigo    last episode several months ago  . [name]     Past Surgical History:  Procedure Laterality Date  . COLONOSCOPY WITH PROPOFOL N/A 06/22/2018   Procedure: COLONOSCOPY WITH PROPOFOL;  Surgeon: Jonathon Bellows, MD;  Location: [facility] ENDOSCOPY;  Service: Endoscopy;  Laterality: N/A;  . CORONARY STENT INTERVENTION N/A 06/07/2019   Procedure: CORONARY STENT INTERVENTION;  Surgeon: [name], MD;  Location: [facility] CV LAB;  Service: Cardiovascular;  Laterality: N/A;  . ESOPHAGOGASTRODUODENOSCOPY (EGD) WITH PROPOFOL N/A 06/22/2018   Procedure: ESOPHAGOGASTRODUODENOSCOPY (EGD) WITH PROPOFOL;  Surgeon: Jonathon Bellows, MD;  Location: [facility] ENDOSCOPY;  Service: Endoscopy;  Laterality: N/A;  . KNEE ARTHROSCOPY Right   . REPLACEMENT TOTAL KNEE BILATERAL    . RIGHT/LEFT HEART CATH AND CORONARY ANGIOGRAPHY N/A 06/07/2019   Procedure: RIGHT/LEFT HEART CATH AND CORONARY ANGIOGRAPHY;  Surgeon: [name], MD;  Location: [facility] LAB;  Service: Cardiovascular;  Laterality: N/A;  . SHOULDER SURGERY Left     Social History:  reports that she has never smoked. She has never used smokeless tobacco. She reports that she does not drink alcohol and does not use drugs.  Family History:  Family History  Problem Relation Age of Onset  . Heart disease Mother    . Hypertension Mother   . Stroke Mother   . Heart disease Father   . Breast cancer Sister   . Alzheimer's disease Brother   . Heart attack Brother   . Stroke Brother   . Heart disease Brother   . Clotting disorder Daughter   . Mental illness Brother        died suicide  . Cancer Brother        prostate  . Heart disease Brother        atrial fib  . Alzheimer's disease Brother   . Fibromyalgia Daughter      Prior to Admission medications   Medication Sig Start Date End Date Taking? Authorizing Provider  acetaminophen (TYLENOL) 325 MG tablet Take 2 tablets (650 mg total) by mouth every 6 (six) hours as needed for mild pain or headache. 06/08/19   Gouru, Illene Silver, MD  albuterol (PROVENTIL) (2.5 MG/3ML) 0.083% nebulizer solution Take 3 mLs (2.5 mg total) by nebulization every 4 (four) hours as needed. 05/16/19 07/07/20  Jerrol Banana., MD  allopurinol (ZYLOPRIM) 300 MG tablet Take 1 tablet by mouth once daily 07/28/20   Jerrol Banana., MD  amitriptyline (ELAVIL) 10 MG tablet Take 1 tablet (10 mg total) by mouth at bedtime. 12/22/20   Jerrol Banana., MD  aspirin EC 81 MG EC tablet Take 1 tablet (81 mg total) by mouth daily. 06/09/19   Nicholes Mango, MD  atorvastatin (LIPITOR) 40 MG tablet Take 1 tablet by mouth once daily 07/28/20   Jerrol Banana., MD  carvedilol (COREG) 12.5 MG tablet Take 1 tablet by mouth twice daily 07/01/20   Jerrol Banana., MD  clopidogrel (PLAVIX) 75 MG tablet Take 1 tablet by mouth once daily 09/08/20   Jerrol Banana., MD  fluticasone [facility]) 50 MCG/ACT nasal spray Use 2 spray(s) in each nostril once daily 09/29/19   Jerrol Banana., MD  hydrochlorothiazide (HYDRODIURIL) 25 MG tablet Take 1 tablet by mouth once daily 01/14/21   Jerrol Banana., MD  levothyroxine (SYNTHROID) 137 MCG tablet Take 1 tablet by mouth once daily 07/28/20   Jerrol Banana., MD  mirtazapine (REMERON) 30 MG tablet TAKE 1 TABLET BY  MOUTH AT BEDTIME 01/12/21   Jerrol Banana., MD  mometasone (ELOCON) 0.1 % cream Apply 1 application topically daily. 06/12/19   Jerrol Banana., MD  Multiple Vitamin (MULTIVITAMIN) capsule Take 1 capsule by mouth daily.    [provider]  nitroGLYCERIN (NITROSTAT) 0.4 MG SL tablet Place 1 tablet (0.4 mg total) under the tongue every 5 (five) minutes as needed for chest pain. 06/25/19 09/23/19  Marrianne Mood D, PA-C  pantoprazole (PROTONIX) 40 MG tablet Take 1 tablet (40 mg total) by mouth daily. 09/11/20   Jerrol Banana., MD  sacubitril-valsartan (ENTRESTO) 49-51 MG Take 1 tablet by mouth 2 (two) times daily. 09/30/20   [name], MD  sertraline (ZOLOFT) 25 MG tablet Take 1 tablet by mouth once daily 12/29/20   Jerrol Banana., MD  sulfamethoxazole-trimethoprim (BACTRIM DS) 800-160 MG tablet Take 1 tablet by mouth 2 (two) times daily. 01/01/21   Mar Daring, PA-C    Physical Exam: Vitals:   03/06/21 1631 03/06/21 1632 03/06/21 1639 03/06/21 1641  BP:    (!) 165/74  Pulse:  75 80   Resp:   17   Temp:      TempSrc:      SpO2: 94%  95%   Weight:      Height:       General: Not in acute distress HEENT:       Eyes: PERRL, EOMI, no scleral icterus.       ENT: No discharge from the ears and nose, no pharynx injection, no tonsillar enlargement.        Neck: No JVD, no bruit, no mass felt. Heme: No neck lymph node enlargement. Cardiac: S1/S2, RRR, No murmurs, No gallops or rubs. Respiratory: No rales, wheezing, rhonchi or rubs. GI: Soft, nondistended, nontender, no rebound pain, no organomegaly, BS present. GU: No hematuria Ext: 1+ pitting leg edema bilaterally. 1+DP/PT pulse bilaterally. Musculoskeletal: No joint deformities, No joint redness or warmth, no limitation of ROM in spin. Skin: No rashes.  Neuro: Alert, oriented X3, cranial nerves II-XII grossly intact, moves all extremities normally.  Psych: Patient is not psychotic, no  suicidal or hemocidal ideation.  Labs on Admission: I have personally reviewed following labs and imaging studies  CBC: Recent Labs  Lab 03/06/21 1033  WBC 7.5  HGB 11.9*  HCT 35.7*  MCV 95.2  PLT 956*   Basic Metabolic Panel: Recent Labs  Lab 03/06/21 1033  NA 138  K 3.7  CL 107  CO2 23  GLUCOSE 120*  BUN 16  CREATININE 1.09*  CALCIUM 8.6*   GFR: Estimated Creatinine Clearance: 33.6 mL/min (A) (by C-G formula based on SCr of 1.09 mg/dL (H)). Liver Function Tests: No results for input(s): AST, ALT, ALKPHOS, BILITOT, PROT, ALBUMIN in the last 168 hours. No results for input(s): LIPASE, AMYLASE in the last 168 hours. No results for input(s): AMMONIA in the last 168 hours. Coagulation Profile: No results for input(s): INR, PROTIME in the last 168 hours. Cardiac Enzymes: No results for input(s): CKTOTAL, CKMB, CKMBINDEX, TROPONINI in the last 168 hours. BNP (last 3 results) No results for input(s): PROBNP in the last 8760 hours. HbA1C: No results for input(s): HGBA1C in the last 72 hours. CBG: No results for input(s): GLUCAP in the last 168 hours. Lipid Profile: No results for input(s): CHOL, HDL, LDLCALC, TRIG, CHOLHDL, LDLDIRECT in the last 72 hours. Thyroid Function Tests: No results for input(s): TSH, T4TOTAL, FREET4, T3FREE, THYROIDAB in the last 72 hours. Anemia Panel: No results for input(s): VITAMINB12, FOLATE, FERRITIN, TIBC, IRON, RETICCTPCT in the last 72 hours. Urine analysis:    Component Value Date/Time   COLORURINE Yellow 01/22/2013 1416   APPEARANCEUR Clear 07/11/2018 1326   LABSPEC 1.011 01/22/2013 1416   PHURINE 6.0 01/22/2013 1416   GLUCOSEU Negative 07/11/2018 1326   GLUCOSEU Negative 01/22/2013 1416   HGBUR Negative 01/22/2013 1416   BILIRUBINUR Negative 07/11/2018 1326   BILIRUBINUR Negative 01/22/2013 1416   KETONESUR Negative 01/22/2013 1416   PROTEINUR Negative 07/11/2018 1326   PROTEINUR Negative 01/22/2013 1416   UROBILINOGEN 0.2  03/22/2018 1053   NITRITE Negative 07/11/2018 1326   NITRITE Negative 01/22/2013 1416   LEUKOCYTESUR 1+ (A) 07/11/2018 1326   LEUKOCYTESUR 3+ 01/22/2013 1416   Sepsis Labs: @LABRCNTIP (procalcitonin:4,lacticidven:4) ) Recent Results (from the past 240 hour(s))  Resp Panel by RT-PCR (Flu A&B, Covid) Nasopharyngeal Swab     Status: Abnormal   Collection Time: 03/06/21 12:32 PM   Specimen: Nasopharyngeal Swab; Nasopharyngeal(NP) swabs in vial transport medium  Result Value Ref Range Status   SARS Coronavirus 2 by RT PCR POSITIVE (A) NEGATIVE Final    Comment: CRITICAL RESULT CALLED TO, READ BACK BY AND VERIFIED WITH: SONJIA WEAVER 1610 03/06/2021 DLB (NOTE) SARS-CoV-2 target nucleic acids are DETECTED.  The SARS-CoV-2 RNA is generally detectable in upper respiratory specimens during the acute phase of infection. Positive results are indicative of the presence of the identified virus, but do not rule out bacterial infection or co-infection with other pathogens not detected by the test. Clinical correlation with patient history and other diagnostic information is necessary to determine patient infection status. The expected result is Negative.  Fact Sheet for Patients: EntrepreneurPulse.com.au  Fact Sheet for Healthcare Providers: IncredibleEmployment.be  This test is not yet approved or cleared by the [place] FDA and  has been authorized for detection and/or diagnosis of SARS-CoV-2 by FDA under an Emergency Use Authorization (EUA).  This EUA will remain in effect (meaning this test  can be used) for the duration of  the COVID-19 declaration under Section 564(b)(1) of the Act, 21 U.S.C. section 360bbb-3(b)(1), unless the authorization is terminated or revoked sooner.     Influenza A by PCR NEGATIVE NEGATIVE Final   Influenza B by PCR NEGATIVE NEGATIVE Final    Comment: (NOTE) The Xpert Xpress SARS-CoV-2/FLU/RSV plus assay is intended as  an aid in the diagnosis of influenza from Nasopharyngeal swab specimens and should not be used as a sole basis for treatment. Nasal washings and aspirates are unacceptable for Xpert Xpress SARS-CoV-2/FLU/RSV testing.  Fact Sheet for Patients: EntrepreneurPulse.com.au  Fact Sheet for Healthcare Providers: IncredibleEmployment.be  This test is not yet approved or cleared by the [place] FDA and has been authorized for detection and/or diagnosis of SARS-CoV-2 by FDA under an Emergency Use Authorization (EUA). This EUA will remain in effect (meaning this test can be used) for the duration of the COVID-19 declaration under Section 564(b)(1) of the Act, 21 U.S.C. section 360bbb-3(b)(1), unless the authorization is terminated or revoked.  Performed at [facility], [address] [place], [place] [address]      Radiological Exams on Admission: DG Chest 2 View  Result Date: 03/06/2021 CLINICAL DATA:  Shortness of breath for 1 day, hypertension, coronary stenting EXAM: CHEST - 2 VIEW COMPARISON:  06/06/2019 FINDINGS: Upper normal heart size. Mediastinal contours and pulmonary vascularity normal. Atherosclerotic calcification aorta. Diffuse interstitial infiltrates are present, though improved versus 06/06/2019. No segmental consolidation, pleural effusion, or pneumothorax. Bones demineralized. IMPRESSION: Diffuse interstitial infiltrates, though improved versus 2020 exam; this could represent pulmonary edema or atypical infection. Aortic Atherosclerosis (ICD10-I70.0). Electronically Signed   By: Lavonia Dana M.D.   On: 03/06/2021 11:26     EKG: I have personally reviewed.  Sinus rhythm, QTC 475, poor R wave progression, old left bundle blockade, occasional PVC  Assessment/Plan Principal Problem:   Acute on chronic combined systolic and diastolic CHF (congestive heart failure) (HCC) Active Problems:   Benign essential HTN   Acid reflux    HLD (hyperlipidemia)   Hypothyroidism   TIA (transient ischemic attack)   CAD (coronary artery disease)   CKD (chronic kidney disease), stage IIIa   Elevated troponin   Thrombocytopenia (HCC)   COVID-19 virus infection   Acute on chronic combined systolic and diastolic CHF: 2D echo  on 09/18/2019 showed EF of 40-45% with grade 1 diastolic dysfunction.  BNP is elevated 960, 1+ leg edema, diffuse interstitial edema on chest x-ray, clinically consistent with CHF exacerbation.  -Will admit to progressive unit as inpatient -Lasix 40 mg bid by IV -On Entresto -2d echo -Daily weights -strict I/O's -Low salt diet -Fluid restriction -Obtain REDs Vest reading  Elevated troponin and CAD: S/p of stent placement.  Troponin 45, 53, likely due to demand ischemia. -Trend troponin -Aspirin, Plavix, Lipitor, Coreg -A1c, FLP -Follow-up 2D echo  COVID-19 virus infection: Oxygen saturation 92-95% on room air.  No obvious infiltration on chest x-ray. -Remdesivir per pharm -Solumedrol 40 mg bid -Bronchodilators -PRN Mucinex for cough -Follow-up inflammatory marker -Will ask the patient to maintain an awake prone position for 16+ hours a day, if possible, with a minimum of 2-3 hours at a time -IF patient deteriorates, will consult PCCM and ID  Benign essential HTN -IV hydralazine as needed -Coreg, Entresto  Acid reflux -Protonix  HLD (hyperlipidemia) -Lipitor  Hypothyroidism -Synthroid  TIA (transient ischemic attack) -Aspirin, Plavix, Lipitor  CKD (chronic kidney disease), stage IIIa: Stable -Follow-up with BMP  Thrombocytopenia ([place]): Platelet 116, likely due to ongoing COVID-19 infection -Check LDH and peripheral smear    DVT ppx: SCD Code Status: Full code Family Communication:  Yes, patient's daughter by phone  disposition Plan:  Anticipate discharge back to previous environment Consults called:  none Admission status and Level of care: Progressive Cardiac:    as inpt          Status is: Inpatient  Remains inpatient appropriate because:Inpatient level of care appropriate due to severity of illness   Dispo: The patient is from: Home              Anticipated d/c is to: Home              Patient currently is not medically stable to d/c.   Difficult to place patient No          Date of Service 03/06/2021    Ivor Costa [facility]   If 7PM-7AM, please contact night-coverage [email or web address] 03/06/2021, 5:25 PM

## 2021-03-06 NOTE — ED Notes (Signed)
Pt states 'I think I am going to pass out and die,' reports this started after initial dose of hydralizine around 1800 and got worse after second dose around 2015. cool cloth brought to forehead, repeat ekg done, notified that bp is going down. Notified provider blount of same

## 2021-03-06 NOTE — ED Notes (Signed)
Called floor as no nurse yet assigned to pt. State they'll assign soon.

## 2021-03-06 NOTE — ED Notes (Signed)
Dr. Corky Downs aware of patient's Covid positive status.

## 2021-03-06 NOTE — ED Triage Notes (Signed)
pt arrives via ems from home. ems reports sob x 1 day. noting makes it worse or better. stent placed a year ago. a&o x 4. NAD noted at this time. respiratory rate WNL, able to speak with equal chest rise and fall.  Able to speak in full sentences at this time. However pt reports trouble speaking earlier today due to sob at that time  ems vitals bp 179/89 hr 76 96% RA IV 22 left hand cbg 97

## 2021-03-06 NOTE — ED Notes (Signed)
Report given to Georgie RN 

## 2021-03-06 NOTE — ED Notes (Signed)
Resp reg/unlabored, pt laying calmly in bed, A&Ox4, skin dry.

## 2021-03-06 NOTE — ED Notes (Addendum)
Attending Mora Bellman notified via secure chat of critical trop in 70s. No new orders.

## 2021-03-06 NOTE — ED Notes (Signed)
Patient ate the fruit on her tray, but declined the rest, stating she was not hungry.

## 2021-03-06 NOTE — ED Notes (Signed)
Report given to next shift RN.

## 2021-03-07 ENCOUNTER — Inpatient Hospital Stay (HOSPITAL_COMMUNITY)
Admit: 2021-03-07 | Discharge: 2021-03-07 | Disposition: A | Discharge status: Home / Self Care | Payer: Medicare Other | Attending: Internal Medicine | Admitting: Internal Medicine

## 2021-03-07 DIAGNOSIS — U071 COVID-19: Secondary | ICD-10-CM

## 2021-03-07 DIAGNOSIS — I5043 Acute on chronic combined systolic (congestive) and diastolic (congestive) heart failure: Secondary | ICD-10-CM

## 2021-03-07 LAB — C-REACTIVE PROTEIN
CRP: 1.7 mg/dL — ABNORMAL HIGH (ref <1.0)
CRP: 2 mg/dL — ABNORMAL HIGH (ref <1.0)

## 2021-03-07 LAB — LIPID PANEL
Cholesterol: 118 mg/dL (ref 0–200)
HDL: 33 mg/dL — ABNORMAL LOW (ref >40)
LDL Cholesterol: 68 mg/dL (ref 0–99)
Total CHOL/HDL Ratio: 3.6 RATIO
Triglycerides: 83 mg/dL (ref <150)
VLDL: 17 mg/dL (ref 0–40)

## 2021-03-07 LAB — COMPREHENSIVE METABOLIC PANEL
ALT: 24 U/L (ref 0–44)
AST: 23 U/L (ref 15–41)
Albumin: 3.9 g/dL (ref 3.5–5.0)
Alkaline Phosphatase: 96 U/L (ref 38–126)
Anion gap: 11 (ref 5–15)
BUN: 19 mg/dL (ref 8–23)
CO2: 23 mmol/L (ref 22–32)
Calcium: 8.8 mg/dL — ABNORMAL LOW (ref 8.9–10.3)
Chloride: 105 mmol/L (ref 98–111)
Creatinine, Ser: 0.9 mg/dL (ref 0.44–1.00)
GFR, Estimated: >60 mL/min (ref >60)
Glucose, Bld: 138 mg/dL — ABNORMAL HIGH (ref 70–99)
Potassium: 3.2 mmol/L — ABNORMAL LOW (ref 3.5–5.1)
Sodium: 139 mmol/L (ref 135–145)
Total Bilirubin: 0.6 mg/dL (ref 0.3–1.2)
Total Protein: 6.6 g/dL (ref 6.5–8.1)

## 2021-03-07 LAB — MAGNESIUM: Magnesium: 1.8 mg/dL (ref 1.7–2.4)

## 2021-03-07 LAB — D-DIMER, QUANTITATIVE: D-Dimer, Quant: 1.83 ug/mL-FEU — ABNORMAL HIGH (ref 0.00–0.50)

## 2021-03-07 MED ORDER — GUAIFENESIN 100 MG/5ML PO SOLN
5.0000 mL | ORAL | Status: DC | PRN
  Administered 2021-03-08 – 2021-03-10 (×6): 100 mg via ORAL
  Filled 2021-03-07 (×7): qty 5

## 2021-03-07 MED ORDER — HYDRALAZINE HCL 10 MG PO TABS
10.0000 mg | ORAL_TABLET | Freq: Three times a day (TID) | ORAL | Status: DC
  Administered 2021-03-07 (×2): 10 mg via ORAL
  Filled 2021-03-07 (×6): qty 1

## 2021-03-07 MED ORDER — POTASSIUM CHLORIDE CRYS ER 20 MEQ PO TBCR
40.0000 meq | EXTENDED_RELEASE_TABLET | Freq: Once | ORAL | Status: AC
  Administered 2021-03-07: 40 meq via ORAL
  Filled 2021-03-07: qty 2

## 2021-03-07 MED ORDER — FUROSEMIDE 10 MG/ML IJ SOLN
20.0000 mg | Freq: Once | INTRAMUSCULAR | Status: AC
  Administered 2021-03-07: 20 mg via INTRAVENOUS
  Filled 2021-03-07: qty 2

## 2021-03-07 NOTE — Progress Notes (Signed)
PROGRESS NOTE  Stephanie Charles CVE:[id number] DOB: 1929/10/23 DOA: 03/06/2021 PCP: Jerrol Banana., MD  HPI/Recap of past 42 hours: 87 year old female with past medical history of hypertension, TIA, mild systolic CHF with ejection fraction of 40 to 45%, stage III chronic kidney disease and hypothyroidism presented to the emergency room on 6/3 with complaints of 1 day of shortness of breath and found to have COVID infection, markedly elevated blood pressures in the 170s to 180s and chest x-ray noting diffuse interstitial edema.  BNP elevated at 960.  Admitted to the hospitalist service and started  on Remdisivir, steroids and Lasix.  Today, patient feeling a little bit better.  Breathing a little bit easier.  Denies any dizziness or chest pain.   Assessment/Plan: Principal Problem:   Acute on chronic combined systolic and diastolic CHF (congestive heart failure) ([place]): Previous echocardiogram noted ejection fraction of 40-45% in December 2020.  Echo ordered and is currently pending.  Patient started on Lasix.  Has already diuresed almost half a liter of fluid. Active Problems: Malignant hypertension: Likely in the setting of volume overload plus COVID infection.  Initially gave Lopressor which led to some mild bradycardia.  She received some IV hydralazine which led to complaints of dizziness and felt like she was going to pass out.  Have started her on p.o. hydralazine.  Her pressure should improve as she continues to diurese.    Acid reflux: PPI.    HLD (hyperlipidemia): Continue Lipitor.    Hypothyroidism: Continue Synthroid.    TIA (transient ischemic attack): History of.  Continue aspirin and Plavix.    CAD (coronary artery disease)   CKD (chronic kidney disease), stage IIIa: Looks to be better than baseline today with creatinine at 0.9 and GFR greater than 60, likely in the setting of increased renal perfusion due to Lasix.    Elevated troponin: Mildly elevated, more likely  due to CHF rather than ACS.  Monitor closely.    Thrombocytopenia ([place]): We will monitor closely.  Mild thrombocytopenia, possibly in the setting of COVID infection    COVID-19 virus infection: Fortunately oxygen saturation staying above 90%.  On Solu-Medrol and Remdisivir p.o. plus treating symptoms.  CRP only at 1.7.  No other signs of infection, procalcitonin at less than 0.1.   Code Status: Full code  Family Communication: Left message for family  Disposition Plan: Home once fully diuresed, COVID stable   Consultants:  None  Procedures:  Echo pending  Antimicrobials:  IV Remdisivir  DVT prophylaxis: On SCDs  Level of care: Progressive Cardiac   Objective: Vitals:   03/07/21 0920 03/07/21 1057  BP:  (!) 156/74  Pulse: 65 62  Resp:  15  Temp:  97.7 F (36.5 C)  SpO2:  93%    Intake/Output Summary (Last 24 hours) at 03/07/2021 1059 Last data filed at 03/06/2021 1819 Gross per 24 hour  Intake --  Output 450 ml  Net -450 ml   Filed Weights   03/06/21 1026 03/06/21 2140 03/07/21 0414  Weight: 74 kg 72.3 kg 72.3 kg   Body mass index is 28.25 kg/m.  Exam:   General: Alert and oriented x 3, NAD   HEENT: , atraumatic, mucous membranes are moist  Cardiovascular: RRR S1, S2, II/VI SEM   Respiratory: Decreased breath sounds bibasilar  Abdomen: Soft, nontender, nondistended, positive bowel sounds  Musculoskeletal: No clubbing or cyanosis, trace pitting edema  Skin: No skin breaks, tears or lesions  Psychiatry: Appropriate, no evidence of psychoses  Neurology: No focal deficits   Data Reviewed: CBC: Recent Labs  Lab 03/06/21 1033  WBC 7.5  HGB 11.9*  HCT 35.7*  MCV 95.2  PLT 177*   Basic Metabolic Panel: Recent Labs  Lab 03/06/21 1033 03/07/21 0514  NA 138 139  K 3.7 3.2*  CL 107 105  CO2 23 23  GLUCOSE 120* 138*  BUN 16 19  CREATININE 1.09* 0.90  CALCIUM 8.6* 8.8*  MG  --  1.8   GFR: Estimated Creatinine Clearance: 41.2  mL/min (by C-G formula based on SCr of 0.9 mg/dL). Liver Function Tests: Recent Labs  Lab 03/07/21 0514  AST 23  ALT 24  ALKPHOS 96  BILITOT 0.6  PROT 6.6  ALBUMIN 3.9   No results for input(s): LIPASE, AMYLASE in the last 168 hours. No results for input(s): AMMONIA in the last 168 hours. Coagulation Profile: No results for input(s): INR, PROTIME in the last 168 hours. Cardiac Enzymes: No results for input(s): CKTOTAL, CKMB, CKMBINDEX, TROPONINI in the last 168 hours. BNP (last 3 results) No results for input(s): PROBNP in the last 8760 hours. HbA1C: No results for input(s): HGBA1C in the last 72 hours. CBG: No results for input(s): GLUCAP in the last 168 hours. Lipid Profile: Recent Labs    03/07/21 0514  CHOL 118  HDL 33*  LDLCALC 68  TRIG 83  CHOLHDL 3.6   Thyroid Function Tests: No results for input(s): TSH, T4TOTAL, FREET4, T3FREE, THYROIDAB in the last 72 hours. Anemia Panel: No results for input(s): VITAMINB12, FOLATE, FERRITIN, TIBC, IRON, RETICCTPCT in the last 72 hours. Urine analysis:    Component Value Date/Time   COLORURINE Yellow 01/22/2013 1416   APPEARANCEUR Clear 07/11/2018 1326   LABSPEC 1.011 01/22/2013 1416   PHURINE 6.0 01/22/2013 1416   GLUCOSEU Negative 07/11/2018 1326   GLUCOSEU Negative 01/22/2013 1416   HGBUR Negative 01/22/2013 1416   BILIRUBINUR Negative 07/11/2018 1326   BILIRUBINUR Negative 01/22/2013 1416   KETONESUR Negative 01/22/2013 1416   PROTEINUR Negative 07/11/2018 1326   PROTEINUR Negative 01/22/2013 1416   UROBILINOGEN 0.2 03/22/2018 1053   NITRITE Negative 07/11/2018 1326   NITRITE Negative 01/22/2013 1416   LEUKOCYTESUR 1+ (A) 07/11/2018 1326   LEUKOCYTESUR 3+ 01/22/2013 1416   Sepsis Labs: @LABRCNTIP (procalcitonin:4,lacticidven:4)  ) Recent Results (from the past 240 hour(s))  Resp Panel by RT-PCR (Flu A&B, Covid) Nasopharyngeal Swab     Status: Abnormal   Collection Time: 03/06/21 12:32 PM   Specimen:  Nasopharyngeal Swab; Nasopharyngeal(NP) swabs in vial transport medium  Result Value Ref Range Status   SARS Coronavirus 2 by RT PCR POSITIVE (A) NEGATIVE Final    Comment: CRITICAL RESULT CALLED TO, READ BACK BY AND VERIFIED WITH: SONJIA WEAVER 1505 03/06/2021 DLB (NOTE) SARS-CoV-2 target nucleic acids are DETECTED.  The SARS-CoV-2 RNA is generally detectable in upper respiratory specimens during the acute phase of infection. Positive results are indicative of the presence of the identified virus, but do not rule out bacterial infection or co-infection with other pathogens not detected by the test. Clinical correlation with patient history and other diagnostic information is necessary to determine patient infection status. The expected result is Negative.  Fact Sheet for Patients: EntrepreneurPulse.com.au  Fact Sheet for Healthcare Providers: IncredibleEmployment.be  This test is not yet approved or cleared by the [place] FDA and  has been authorized for detection and/or diagnosis of SARS-CoV-2 by FDA under an Emergency Use Authorization (EUA).  This EUA will remain in effect (meaning this test  can be used) for the duration of  the COVID-19 declaration under Section 564(b)(1) of the Act, 21 U.S.C. section 360bbb-3(b)(1), unless the authorization is terminated or revoked sooner.     Influenza A by PCR NEGATIVE NEGATIVE Final   Influenza B by PCR NEGATIVE NEGATIVE Final    Comment: (NOTE) The Xpert Xpress SARS-CoV-2/FLU/RSV plus assay is intended as an aid in the diagnosis of influenza from Nasopharyngeal swab specimens and should not be used as a sole basis for treatment. Nasal washings and aspirates are unacceptable for Xpert Xpress SARS-CoV-2/FLU/RSV testing.  Fact Sheet for Patients: EntrepreneurPulse.com.au  Fact Sheet for Healthcare Providers: IncredibleEmployment.be  This test is not yet  approved or cleared by the [place] FDA and has been authorized for detection and/or diagnosis of SARS-CoV-2 by FDA under an Emergency Use Authorization (EUA). This EUA will remain in effect (meaning this test can be used) for the duration of the COVID-19 declaration under Section 564(b)(1) of the Act, 21 U.S.C. section 360bbb-3(b)(1), unless the authorization is terminated or revoked.  Performed at [facility], [address] [place], [place] [address]   Blood culture (routine x 2)     Status: None (Preliminary result)   Collection Time: 03/06/21 12:32 PM   Specimen: BLOOD LEFT HAND  Result Value Ref Range Status   Specimen Description BLOOD LEFT HAND  Final   Special Requests   Final    BOTTLES DRAWN AEROBIC AND ANAEROBIC Blood Culture results may not be optimal due to an inadequate volume of blood received in culture bottles   Culture   Final    NO GROWTH < 24 HOURS Performed at [facility], [address], [place], [place] [address]    Report Status PENDING  Incomplete  Blood culture (routine x 2)     Status: None (Preliminary result)   Collection Time: 03/06/21 12:32 PM   Specimen: BLOOD  Result Value Ref Range Status   Specimen Description BLOOD RIGHT ANTECUBITAL  Final   Special Requests   Final    BOTTLES DRAWN AEROBIC AND ANAEROBIC Blood Culture results may not be optimal due to an inadequate volume of blood received in culture bottles   Culture   Final    NO GROWTH < 24 HOURS Performed at [facility], [address] [place], [place] [address]    Report Status PENDING  Incomplete      Studies: DG Chest 2 View  Result Date: 03/06/2021 CLINICAL DATA:  Shortness of breath for 1 day, hypertension, coronary stenting EXAM: CHEST - 2 VIEW COMPARISON:  06/06/2019 FINDINGS: Upper normal heart size. Mediastinal contours and pulmonary vascularity normal. Atherosclerotic calcification aorta. Diffuse interstitial infiltrates are present,  though improved versus 06/06/2019. No segmental consolidation, pleural effusion, or pneumothorax. Bones demineralized. IMPRESSION: Diffuse interstitial infiltrates, though improved versus 2020 exam; this could represent pulmonary edema or atypical infection. Aortic Atherosclerosis (ICD10-I70.0). Electronically Signed   By: Lavonia Dana M.D.   On: 03/06/2021 11:26    Scheduled Meds: . allopurinol  300 mg Oral Daily  . aspirin EC  81 mg Oral Daily  . atorvastatin  40 mg Oral QPM  . carvedilol  12.5 mg Oral BID  . clopidogrel  75 mg Oral Daily  . ferrous sulfate  27 mg Oral QHS  . furosemide  40 mg Intravenous Q12H  . hydrALAZINE  10 mg Oral Q8H  . levothyroxine  137 mcg Oral Q0600  . melatonin  10 mg Oral QHS  . methylPREDNISolone (SOLU-MEDROL) injection  40 mg Intravenous Q12H  . mirtazapine  30 mg Oral QHS  . multivitamin with minerals  1 tablet Oral QPM  . pantoprazole  40 mg Oral Daily  . sacubitril-valsartan  1 tablet Oral BID  . sertraline  25 mg Oral Daily    Continuous Infusions: . remdesivir 100 mg in NS 100 mL       LOS: 1 day     Annita Brod, MD [facility]   03/07/2021, 10:59 AM

## 2021-03-08 LAB — ECHOCARDIOGRAM COMPLETE
AR max vel: 2.46 cm2
AV Area VTI: 2.22 cm2
AV Area mean vel: 2.34 cm2
AV Mean grad: 3 mmHg
AV Peak grad: 6.1 mmHg
Ao pk vel: 1.23 m/s
Height: 63 in
S' Lateral: 3.4 cm
Weight: 2552 oz

## 2021-03-08 LAB — COMPREHENSIVE METABOLIC PANEL
ALT: 23 U/L (ref 0–44)
AST: 23 U/L (ref 15–41)
Albumin: 3.7 g/dL (ref 3.5–5.0)
Alkaline Phosphatase: 84 U/L (ref 38–126)
Anion gap: 10 (ref 5–15)
BUN: 44 mg/dL — ABNORMAL HIGH (ref 8–23)
CO2: 25 mmol/L (ref 22–32)
Calcium: 8.8 mg/dL — ABNORMAL LOW (ref 8.9–10.3)
Chloride: 106 mmol/L (ref 98–111)
Creatinine, Ser: 1.41 mg/dL — ABNORMAL HIGH (ref 0.44–1.00)
GFR, Estimated: 36 mL/min — ABNORMAL LOW (ref >60)
Glucose, Bld: 157 mg/dL — ABNORMAL HIGH (ref 70–99)
Potassium: 3.6 mmol/L (ref 3.5–5.1)
Sodium: 141 mmol/L (ref 135–145)
Total Bilirubin: 0.7 mg/dL (ref 0.3–1.2)
Total Protein: 6.4 g/dL — ABNORMAL LOW (ref 6.5–8.1)

## 2021-03-08 LAB — C-REACTIVE PROTEIN: CRP: 1 mg/dL — ABNORMAL HIGH (ref <1.0)

## 2021-03-08 MED ORDER — FUROSEMIDE 10 MG/ML IJ SOLN
20.0000 mg | Freq: Two times a day (BID) | INTRAMUSCULAR | Status: DC
  Administered 2021-03-08 – 2021-03-10 (×4): 20 mg via INTRAVENOUS
  Filled 2021-03-08 (×4): qty 2

## 2021-03-08 NOTE — Progress Notes (Signed)
Pt this evening scheduled to receive 10mg  PO of Hydralazine. Pt refused medication even though it was not through IV was still hesitant of taking the medication because of the dizziness that it gave her on yesterday in the ED.

## 2021-03-08 NOTE — TOC Progression Note (Signed)
Transition of Care [facility]) - Progression Note    Patient Details  Name: Stephanie SCHWEPPE MRN: [id number] Date of Birth: [birth year]  [facility] CM/SW Contact  Izola Price, RN Phone Number: 03/08/2021, 2:10 PM  Clinical Narrative: 6/5 Admit 6/3 1 week SOB. C+ on test. Res. Started. Lasix. Steroids. No DC plan as yet.  Htx: HTN, TIA, CHF.  PCP: [name]. [facility] [place] (N), [place] - [address] RX: [place], [place] A Simmie Davies RN CM              Expected Discharge Plan and Services                                                 Social Determinants of Health (SDOH) Interventions    Readmission Risk Interventions No flowsheet data found.

## 2021-03-08 NOTE — Evaluation (Signed)
Physical Therapy Evaluation Patient Details Name: Stephanie Charles MRN: [id number] DOB: Jul 09, 1930 Today's Date: 03/08/2021   History of Present Illness  Patient is a 87 y.o. female with medical history significant of hypertension, hyperlipidemia, TIA, GERD, hypothyroidism, gout, depression, CAD, stent placement, anemia, vertigo, CHF with EF of 40-45%, CKD stage IIIa, who presents with shortness of breath. Found to be COVID positive and acute on chronic combined systolic and diastolic CHF, elevated troponin likey due to demand ischemia. Also with benign essential hypertension.  Clinical Impression  Patient is pleasant and cooperative during evaluation. She reports she is independent at baseline with mobility and ADLs and does not use an assistive device for ambulation.  Patient currently needs no physical assistance for bed mobility. Supervision provided for safety with transfers. Occasional unsteadiness with ambulation without assistive device that required minimal assistance from therapist, especially with turns and obstacle negotiation. Encouraged patient to use rolling walker for safety and fall prevention. Sp02 97% on room air after walking. Patient is motivated to return home at discharge and her daughter lives with her at baseline. Recommend HHPT for continued PT to maximize independence and facilitate return to prior level of function.     Follow Up Recommendations Home health PT;Supervision - Intermittent    Equipment Recommendations  None recommended by PT    Recommendations for Other Services       Precautions / Restrictions Precautions Precautions: Fall Precaution Comments: COVID positive Restrictions Weight Bearing Restrictions: No      Mobility  Bed Mobility Overal bed mobility: Needs Assistance Bed Mobility: Supine to Sit;Sit to Supine     Supine to sit: Supervision;HOB elevated Sit to supine: Supervision;HOB elevated   General bed mobility comments: extra time  required to complete tasks. no phyiscal assistance required    Transfers Overall transfer level: Needs assistance Equipment used: None Transfers: Sit to/from Stand Sit to Stand: Supervision         General transfer comment: supervision for safety  Ambulation/Gait Ambulation/Gait assistance: Min assist;Min guard Gait Distance (Feet): 35 Feet Assistive device: None Gait Pattern/deviations: Step-through pattern;Staggering right;Narrow base of support Gait velocity: decreased   General Gait Details: patient had several bouts of unsteadiness with ambulation that required occasional assistance to correct, especially with turns/navigating obstacles in room. patient would benefit from using rolling walker for safety and fall prevention- educated patient on this and she has a walker at home used previously after knee surgery  Stairs            Wheelchair Mobility    Modified Rankin (Stroke Patients Only)       Balance Overall balance assessment: Needs assistance Sitting-balance support: Feet supported Sitting balance-Leahy Scale: Good     Standing balance support: No upper extremity supported Standing balance-Leahy Scale: Fair                               Pertinent Vitals/Pain Pain Assessment: No/denies pain    Home Living Family/patient expects to be discharged to:: Private residence Living Arrangements: Children Available Help at Discharge: Family Type of Home: House Home Access:  (one step to enter)     Home Layout: One level Home Equipment: Environmental consultant - 2 wheels      Prior Function Level of Independence: Independent         Comments: Patient reports she is independent with activity and ADLs, drives. Daughter lives with her but does not provide assistance per patient report  Hand Dominance        Extremity/Trunk Assessment   Upper Extremity Assessment Upper Extremity Assessment: Overall WFL for tasks assessed    Lower Extremity  Assessment Lower Extremity Assessment: Generalized weakness       Communication   Communication: No difficulties  Cognition Arousal/Alertness: Awake/alert Behavior During Therapy: WFL for tasks assessed/performed Overall Cognitive Status: Within Functional Limits for tasks assessed                                        General Comments General comments (skin integrity, edema, etc.): Sp02 97% on room air after walking    Exercises     Assessment/Plan    PT Assessment Patient needs continued PT services  PT Problem List Decreased strength;Decreased activity tolerance;Decreased balance;Decreased mobility;Decreased safety awareness       PT Treatment Interventions DME instruction;Gait training;Stair training;Functional mobility training;Therapeutic activities;Therapeutic exercise;Balance training;Neuromuscular re-education    PT Goals (Current goals can be found in the Care Plan section)  Acute Rehab PT Goals Patient Stated Goal: to go home PT Goal Formulation: With patient Time For Goal Achievement: 03/22/21 Potential to Achieve Goals: Good    Frequency Min 2X/week   Barriers to discharge        Co-evaluation               AM-PAC PT "6 Clicks" Mobility  Outcome Measure Help needed turning from your back to your side while in a flat bed without using bedrails?: None Help needed moving from lying on your back to sitting on the side of a flat bed without using bedrails?: A Little Help needed moving to and from a bed to a chair (including a wheelchair)?: A Little Help needed standing up from a chair using your arms (e.g., wheelchair or bedside chair)?: A Little Help needed to walk in hospital room?: A Little Help needed climbing 3-5 steps with a railing? : A Little 6 Click Score: 19    End of Session   Activity Tolerance: Patient tolerated treatment well Patient left: in bed;with call bell/phone within reach;with bed alarm set   PT Visit  Diagnosis: Unsteadiness on feet (R26.81);Muscle weakness (generalized) (M62.81)    Time: 0312-8118 PT Time Calculation (min) (ACUTE ONLY): 33 min   Charges:   PT Evaluation $PT Eval Low Complexity: 1 Low PT Treatments $Therapeutic Activity: 8-22 mins        [name], PT, MPT   Percell Locus 03/08/2021, 2:03 PM

## 2021-03-08 NOTE — Progress Notes (Signed)
PROGRESS NOTE  Stephanie Charles GXQ:[id number] DOB: December 23, 1929 DOA: 03/06/2021 PCP: Jerrol Banana., MD  HPI/Recap of past 22 hours: 87 year old female with past medical history of hypertension, TIA, mild systolic CHF with ejection fraction of 40 to 45%, stage III chronic kidney disease and hypothyroidism presented to the emergency room on 6/3 with complaints of 1 day of shortness of breath and found to have COVID infection, markedly elevated blood pressures in the 170s to 180s and chest x-ray noting diffuse interstitial edema.  BNP elevated at 960.  Admitted to the hospitalist service and started  on Remdisivir, steroids and Lasix.  To date, patient has diuresed almost 2.5 L.  CRP today down to 1.0.  She is breathing comfortably on room air.  Creatinine increased to 1.41.  Labs otherwise unremarkable.  Feels better today.   Assessment/Plan: Principal Problem:   Acute on chronic combined systolic and diastolic CHF (congestive heart failure) ([place]): Previous echocardiogram noted ejection fraction of 40-45% in December 2020.  Echo ordered and is currently pending.  Patient started on Lasix.  She has diuresed almost 2.5 L.  Given bump in creatinine, will decrease Lasix to 20 mg IV twice daily. Active Problems: Malignant hypertension: Likely in the setting of volume overload plus COVID infection.  Initially gave Lopressor which led to some mild bradycardia.  She received some IV hydralazine which led to complaints of dizziness and felt like she was going to pass out.  .  Her pressures have improved now that she is diuresing.    Acid reflux: PPI.    HLD (hyperlipidemia): Continue Lipitor.    Hypothyroidism: Continue Synthroid.    TIA (transient ischemic attack): History of.  Continue aspirin and Plavix.    CAD (coronary artery disease)   CKD (chronic kidney disease), stage IIIa: Looks to be better than baseline today with creatinine at 0.9 and GFR greater than 60, likely in the setting of  increased renal perfusion due to Lasix.    Elevated troponin: Mildly elevated, more likely due to CHF rather than ACS.  Monitor closely.    Thrombocytopenia ([place]): We will monitor closely.  Mild thrombocytopenia, possibly in the setting of COVID infection    COVID-19 virus infection: Fortunately oxygen saturation staying above 90%.  On Solu-Medrol and Remdisivir p.o. plus treating symptoms.  CRP only at 1.7.  No other signs of infection, procalcitonin at less than 0.1.   Code Status: Full code  Family Communication: Updated daughter by phone.  Disposition Plan: Home once fully diuresed, COVID stable and finished Remdisivir, likely after Tuesday   Consultants:  None  Procedures:  Echo pending  Antimicrobials:  IV Remdisivir  DVT prophylaxis: On SCDs  Level of care: Progressive Cardiac   Objective: Vitals:   03/08/21 0621 03/08/21 0832  BP: (!) 147/71 (!) 150/74  Pulse: 79 82  Resp:  17  Temp: 97.8 F (36.6 C) 97.8 F (36.6 C)  SpO2: 93% 94%    Intake/Output Summary (Last 24 hours) at 03/08/2021 1035 Last data filed at 03/08/2021 0833 Gross per 24 hour  Intake 340 ml  Output 1250 ml  Net -910 ml   Filed Weights   03/06/21 1026 03/06/21 2140 03/07/21 0414  Weight: 74 kg 72.3 kg 72.3 kg   Body mass index is 28.25 kg/m.  Exam:   General: Alert and oriented x 3, NAD   HEENT: [place], atraumatic, mucous membranes are moist  Cardiovascular: RRR S1, S2, II/VI SEM   Respiratory: Decreased breath sounds bibasilar  Abdomen: Soft, nontender, nondistended, positive bowel sounds  Musculoskeletal: No clubbing or cyanosis, trace pitting edema  Skin: No skin breaks, tears or lesions  Psychiatry: Appropriate, no evidence of psychoses  Neurology: No focal deficits   Data Reviewed: CBC: Recent Labs  Lab 03/06/21 1033  WBC 7.5  HGB 11.9*  HCT 35.7*  MCV 95.2  PLT 973*   Basic Metabolic Panel: Recent Labs  Lab 03/06/21 1033 03/07/21 0514 03/08/21 0503   NA 138 139 141  K 3.7 3.2* 3.6  CL 107 105 106  CO2 23 23 25   GLUCOSE 120* 138* 157*  BUN 16 19 44*  CREATININE 1.09* 0.90 1.41*  CALCIUM 8.6* 8.8* 8.8*  MG  --  1.8  --    GFR: Estimated Creatinine Clearance: 26.3 mL/min (A) (by C-G formula based on SCr of 1.41 mg/dL (H)). Liver Function Tests: Recent Labs  Lab 03/07/21 0514 03/08/21 0503  AST 23 23  ALT 24 23  ALKPHOS 96 84  BILITOT 0.6 0.7  PROT 6.6 6.4*  ALBUMIN 3.9 3.7   No results for input(s): LIPASE, AMYLASE in the last 168 hours. No results for input(s): AMMONIA in the last 168 hours. Coagulation Profile: No results for input(s): INR, PROTIME in the last 168 hours. Cardiac Enzymes: No results for input(s): CKTOTAL, CKMB, CKMBINDEX, TROPONINI in the last 168 hours. BNP (last 3 results) No results for input(s): PROBNP in the last 8760 hours. HbA1C: No results for input(s): HGBA1C in the last 72 hours. CBG: No results for input(s): GLUCAP in the last 168 hours. Lipid Profile: Recent Labs    03/07/21 0514  CHOL 118  HDL 33*  LDLCALC 68  TRIG 83  CHOLHDL 3.6   Thyroid Function Tests: No results for input(s): TSH, T4TOTAL, FREET4, T3FREE, THYROIDAB in the last 72 hours. Anemia Panel: No results for input(s): VITAMINB12, FOLATE, FERRITIN, TIBC, IRON, RETICCTPCT in the last 72 hours. Urine analysis:    Component Value Date/Time   COLORURINE Yellow 01/22/2013 1416   APPEARANCEUR Clear 07/11/2018 1326   LABSPEC 1.011 01/22/2013 1416   PHURINE 6.0 01/22/2013 1416   GLUCOSEU Negative 07/11/2018 1326   GLUCOSEU Negative 01/22/2013 1416   HGBUR Negative 01/22/2013 1416   BILIRUBINUR Negative 07/11/2018 1326   BILIRUBINUR Negative 01/22/2013 1416   KETONESUR Negative 01/22/2013 1416   PROTEINUR Negative 07/11/2018 1326   PROTEINUR Negative 01/22/2013 1416   UROBILINOGEN 0.2 03/22/2018 1053   NITRITE Negative 07/11/2018 1326   NITRITE Negative 01/22/2013 1416   LEUKOCYTESUR 1+ (A) 07/11/2018 1326    LEUKOCYTESUR 3+ 01/22/2013 1416   Sepsis Labs: @LABRCNTIP (procalcitonin:4,lacticidven:4)  ) Recent Results (from the past 240 hour(s))  Resp Panel by RT-PCR (Flu A&B, Covid) Nasopharyngeal Swab     Status: Abnormal   Collection Time: 03/06/21 12:32 PM   Specimen: Nasopharyngeal Swab; Nasopharyngeal(NP) swabs in vial transport medium  Result Value Ref Range Status   SARS Coronavirus 2 by RT PCR POSITIVE (A) NEGATIVE Final    Comment: CRITICAL RESULT CALLED TO, READ BACK BY AND VERIFIED WITH: SONJIA WEAVER 1505 03/06/2021 DLB (NOTE) SARS-CoV-2 target nucleic acids are DETECTED.  The SARS-CoV-2 RNA is generally detectable in upper respiratory specimens during the acute phase of infection. Positive results are indicative of the presence of the identified virus, but do not rule out bacterial infection or co-infection with other pathogens not detected by the test. Clinical correlation with patient history and other diagnostic information is necessary to determine patient infection status. The expected result is Negative.  Fact  Sheet for Patients: EntrepreneurPulse.com.au  Fact Sheet for Healthcare Providers: IncredibleEmployment.be  This test is not yet approved or cleared by the [place] FDA and  has been authorized for detection and/or diagnosis of SARS-CoV-2 by FDA under an Emergency Use Authorization (EUA).  This EUA will remain in effect (meaning this test  can be used) for the duration of  the COVID-19 declaration under Section 564(b)(1) of the Act, 21 U.S.C. section 360bbb-3(b)(1), unless the authorization is terminated or revoked sooner.     Influenza A by PCR NEGATIVE NEGATIVE Final   Influenza B by PCR NEGATIVE NEGATIVE Final    Comment: (NOTE) The Xpert Xpress SARS-CoV-2/FLU/RSV plus assay is intended as an aid in the diagnosis of influenza from Nasopharyngeal swab specimens and should not be used as a sole basis for treatment.  Nasal washings and aspirates are unacceptable for Xpert Xpress SARS-CoV-2/FLU/RSV testing.  Fact Sheet for Patients: EntrepreneurPulse.com.au  Fact Sheet for Healthcare Providers: IncredibleEmployment.be  This test is not yet approved or cleared by the [place] FDA and has been authorized for detection and/or diagnosis of SARS-CoV-2 by FDA under an Emergency Use Authorization (EUA). This EUA will remain in effect (meaning this test can be used) for the duration of the COVID-19 declaration under Section 564(b)(1) of the Act, 21 U.S.C. section 360bbb-3(b)(1), unless the authorization is terminated or revoked.  Performed at [facility], [address] [place], [place] [address]   Blood culture (routine x 2)     Status: None (Preliminary result)   Collection Time: 03/06/21 12:32 PM   Specimen: BLOOD LEFT HAND  Result Value Ref Range Status   Specimen Description BLOOD LEFT HAND  Final   Special Requests   Final    BOTTLES DRAWN AEROBIC AND ANAEROBIC Blood Culture results may not be optimal due to an inadequate volume of blood received in culture bottles   Culture   Final    NO GROWTH 2 DAYS Performed at [facility], [address] [place], [place] [address]    Report Status PENDING  Incomplete  Blood culture (routine x 2)     Status: None (Preliminary result)   Collection Time: 03/06/21 12:32 PM   Specimen: BLOOD  Result Value Ref Range Status   Specimen Description BLOOD RIGHT ANTECUBITAL  Final   Special Requests   Final    BOTTLES DRAWN AEROBIC AND ANAEROBIC Blood Culture results may not be optimal due to an inadequate volume of blood received in culture bottles   Culture   Final    NO GROWTH 2 DAYS Performed at [facility], [address] [place], [place] [address]    Report Status PENDING  Incomplete      Studies: No results found.  Scheduled Meds: . allopurinol  300 mg Oral Daily  .  aspirin EC  81 mg Oral Daily  . atorvastatin  40 mg Oral QPM  . carvedilol  12.5 mg Oral BID  . clopidogrel  75 mg Oral Daily  . ferrous sulfate  27 mg Oral QHS  . furosemide  40 mg Intravenous Q12H  . levothyroxine  137 mcg Oral Q0600  . melatonin  10 mg Oral QHS  . methylPREDNISolone (SOLU-MEDROL) injection  40 mg Intravenous Q12H  . mirtazapine  30 mg Oral QHS  . multivitamin with minerals  1 tablet Oral QPM  . pantoprazole  40 mg Oral Daily  . sacubitril-valsartan  1 tablet Oral BID  . sertraline  25 mg Oral Daily  Continuous Infusions: . remdesivir 100 mg in NS 100 mL 100 mg (03/08/21 0930)     LOS: 2 days     Annita Brod, MD [facility]   03/08/2021, 10:35 AM

## 2021-03-09 LAB — C-REACTIVE PROTEIN: CRP: 0.6 mg/dL (ref <1.0)

## 2021-03-09 LAB — CBC WITH DIFFERENTIAL/PLATELET
Abs Immature Granulocytes: 0.06 10*3/uL (ref 0.00–0.07)
Basophils Absolute: 0 10*3/uL (ref 0.0–0.1)
Basophils Relative: 0 %
Eosinophils Absolute: 0 10*3/uL (ref 0.0–0.5)
Eosinophils Relative: 0 %
HCT: 39.9 % (ref 36.0–46.0)
Hemoglobin: 13.6 g/dL (ref 12.0–15.0)
Immature Granulocytes: 1 %
Lymphocytes Relative: 9 %
Lymphs Abs: 1.1 10*3/uL (ref 0.7–4.0)
MCH: 32.1 pg (ref 26.0–34.0)
MCHC: 34.1 g/dL (ref 30.0–36.0)
MCV: 94.1 fL (ref 80.0–100.0)
Monocytes Absolute: 0.4 10*3/uL (ref 0.1–1.0)
Monocytes Relative: 3 %
Neutro Abs: 10.3 10*3/uL — ABNORMAL HIGH (ref 1.7–7.7)
Neutrophils Relative %: 87 %
Platelets: 170 10*3/uL (ref 150–400)
RBC: 4.24 MIL/uL (ref 3.87–5.11)
RDW: 14.7 % (ref 11.5–15.5)
WBC: 11.8 10*3/uL — ABNORMAL HIGH (ref 4.0–10.5)
nRBC: 0 % (ref 0.0–0.2)

## 2021-03-09 LAB — COMPREHENSIVE METABOLIC PANEL
ALT: 19 U/L (ref 0–44)
AST: 19 U/L (ref 15–41)
Albumin: 3.6 g/dL (ref 3.5–5.0)
Alkaline Phosphatase: 76 U/L (ref 38–126)
Anion gap: 10 (ref 5–15)
BUN: 79 mg/dL — ABNORMAL HIGH (ref 8–23)
CO2: 26 mmol/L (ref 22–32)
Calcium: 8.5 mg/dL — ABNORMAL LOW (ref 8.9–10.3)
Chloride: 103 mmol/L (ref 98–111)
Creatinine, Ser: 1.52 mg/dL — ABNORMAL HIGH (ref 0.44–1.00)
GFR, Estimated: 33 mL/min — ABNORMAL LOW (ref >60)
Glucose, Bld: 143 mg/dL — ABNORMAL HIGH (ref 70–99)
Potassium: 3.2 mmol/L — ABNORMAL LOW (ref 3.5–5.1)
Sodium: 139 mmol/L (ref 135–145)
Total Bilirubin: 0.7 mg/dL (ref 0.3–1.2)
Total Protein: 6.1 g/dL — ABNORMAL LOW (ref 6.5–8.1)

## 2021-03-09 LAB — HEMOGLOBIN A1C
Hgb A1c MFr Bld: 5.6 % (ref 4.8–5.6)
Mean Plasma Glucose: 114 mg/dL

## 2021-03-09 LAB — PATHOLOGIST SMEAR REVIEW

## 2021-03-09 MED ORDER — IPRATROPIUM-ALBUTEROL 0.5-2.5 (3) MG/3ML IN SOLN: 3.0000 mL | RESPIRATORY_TRACT | Status: DC | PRN

## 2021-03-09 MED ORDER — POTASSIUM CHLORIDE CRYS ER 20 MEQ PO TBCR
40.0000 meq | EXTENDED_RELEASE_TABLET | Freq: Once | ORAL | Status: AC
  Administered 2021-03-09: 40 meq via ORAL
  Filled 2021-03-09: qty 2

## 2021-03-09 MED ORDER — AMLODIPINE BESYLATE 10 MG PO TABS
10.0000 mg | ORAL_TABLET | Freq: Every day | ORAL | Status: DC
  Administered 2021-03-09 – 2021-03-10 (×2): 10 mg via ORAL
  Filled 2021-03-09 (×2): qty 1

## 2021-03-09 NOTE — Care Management Important Message (Signed)
Important Message  Patient Details  Name: Stephanie Charles MRN: [id number] Date of Birth: [birth year]   Medicare Important Message Given:  Other (see comment)  Attempted to reach patient via room phone to review Medicare IM due to isolation status.  Unable to reach at this time.     Dannette Barbara 03/09/2021, 3:01 PM

## 2021-03-09 NOTE — Plan of Care (Signed)
  Problem: Activity: Goal: Risk for activity intolerance will decrease Outcome: Progressing   

## 2021-03-09 NOTE — Progress Notes (Signed)
PROGRESS NOTE  Stephanie Charles MOQ:[id number] DOB: [birth year] DOA: 03/06/2021 PCP: Jerrol Banana., MD  HPI/Recap of past 73 hours: 87 year old female with past medical history of hypertension, TIA, mild systolic CHF with ejection fraction of 40 to 45%, stage III chronic kidney disease and hypothyroidism presented to the emergency room on 6/3 with complaints of 1 day of shortness of breath and found to have COVID infection, markedly elevated blood pressures in the 170s to 180s and chest x-ray noting diffuse interstitial edema.  BNP elevated at 960.  Admitted to the hospitalist service and started  on Remdisivir, steroids and Lasix.  To date, patient has diuresed almost 4.5 L.  CRP staying low.  She is breathing comfortably on room air.  Creatinine increased to 1.5.  Labs otherwise unremarkable.  Patient doing okay.  Breathing okay although she did need a breathing treatment.  Otherwise no complaints.   Assessment/Plan: Principal Problem:   Acute on chronic diastolic CHF (congestive heart failure) ([place]): Previous echocardiogram noted ejection fraction of 40-45% in December 2020.  Echo ordered and is currently pending.  Patient started on Lasix.  She has diuresed almost 4.5L.  Given creatinine trending upwards, will likely change to oral tomorrow, low dose for discharge. Active Problems: Malignant hypertension: Likely in the setting of volume overload plus COVID infection.  Initially gave Lopressor which led to some mild bradycardia.  She received some IV hydralazine which led to complaints of dizziness and felt like she was going to pass out.  .  Her pressures have improved some, but still somewhat high.  Adding norvasc.    Acid reflux: PPI.    HLD (hyperlipidemia): Continue Lipitor.    Hypothyroidism: Continue Synthroid.    TIA (transient ischemic attack): History of.  Continue aspirin and Plavix.    CAD (coronary artery disease) AKI in the setting of CKD (chronic kidney disease),  stage IIIa: Secondary to Lasix use.    Elevated troponin: Mildly elevated, more likely due to CHF rather than ACS.  Monitor closely.    Thrombocytopenia ([place]): We will monitor closely.  Mild thrombocytopenia, possibly in the setting of COVID infection    COVID-19 virus infection: Fortunately oxygen saturation staying above 90%.  On Solu-Medrol and Remdisivir p.o. plus treating symptoms.  CRP only at 1.7.  No other signs of infection, procalcitonin at less than 0.1.   Code Status: Full code  Family Communication: Updated daughter by phone.  Disposition Plan: Home once fully diuresed, COVID stable and finished Remdisivir, likely after Tuesday   Consultants:  None  Procedures:  Echo notes preserved ejection fraction in the low end of normal at 50% with indeterminate diastolic dysfunction.  When compared to previous echocardiogram, systolic is actually slightly improved  Antimicrobials:  IV Remdisivir  DVT prophylaxis: On SCDs  Level of care: Progressive Cardiac   Objective: Vitals:   03/09/21 0556 03/09/21 0812  BP: (!) 151/77 (!) 167/73  Pulse: 61 64  Resp: 16 18  Temp: 98.7 F (37.1 C) 98.1 F (36.7 C)  SpO2: 90% 92%    Intake/Output Summary (Last 24 hours) at 03/09/2021 1054 Last data filed at 03/09/2021 0645 Gross per 24 hour  Intake 820 ml  Output 1450 ml  Net -630 ml   Filed Weights   03/06/21 2140 03/07/21 0414 03/09/21 0646  Weight: 72.3 kg 72.3 kg 71.7 kg   Body mass index is 28 kg/m.  Exam:   General: Alert and oriented x 3, NAD   HEENT: [place], atraumatic, mucous  membranes are moist  Cardiovascular: RRR S1, S2, II/VI SEM   Respiratory: Decreased breath sounds bibasilar  Abdomen: Soft, nontender, nondistended, positive bowel sounds  Musculoskeletal: No clubbing or cyanosis, trace pitting edema  Skin: No skin breaks, tears or lesions  Psychiatry: Appropriate, no evidence of psychoses  Neurology: No focal deficits   Data  Reviewed: CBC: Recent Labs  Lab 03/06/21 1033 03/09/21 0623  WBC 7.5 11.8*  NEUTROABS  --  10.3*  HGB 11.9* 13.6  HCT 35.7* 39.9  MCV 95.2 94.1  PLT 116* 449   Basic Metabolic Panel: Recent Labs  Lab 03/06/21 1033 03/07/21 0514 03/08/21 0503 03/09/21 0623  NA 138 139 141 139  K 3.7 3.2* 3.6 3.2*  CL 107 105 106 103  CO2 23 23 25 26   GLUCOSE 120* 138* 157* 143*  BUN 16 19 44* 79*  CREATININE 1.09* 0.90 1.41* 1.52*  CALCIUM 8.6* 8.8* 8.8* 8.5*  MG  --  1.8  --   --    GFR: Estimated Creatinine Clearance: 24.3 mL/min (A) (by C-G formula based on SCr of 1.52 mg/dL (H)). Liver Function Tests: Recent Labs  Lab 03/07/21 0514 03/08/21 0503 03/09/21 0623  AST 23 23 19   ALT 24 23 19   ALKPHOS 96 84 76  BILITOT 0.6 0.7 0.7  PROT 6.6 6.4* 6.1*  ALBUMIN 3.9 3.7 3.6   No results for input(s): LIPASE, AMYLASE in the last 168 hours. No results for input(s): AMMONIA in the last 168 hours. Coagulation Profile: No results for input(s): INR, PROTIME in the last 168 hours. Cardiac Enzymes: No results for input(s): CKTOTAL, CKMB, CKMBINDEX, TROPONINI in the last 168 hours. BNP (last 3 results) No results for input(s): PROBNP in the last 8760 hours. HbA1C: No results for input(s): HGBA1C in the last 72 hours. CBG: No results for input(s): GLUCAP in the last 168 hours. Lipid Profile: Recent Labs    03/07/21 0514  CHOL 118  HDL 33*  LDLCALC 68  TRIG 83  CHOLHDL 3.6   Thyroid Function Tests: No results for input(s): TSH, T4TOTAL, FREET4, T3FREE, THYROIDAB in the last 72 hours. Anemia Panel: No results for input(s): VITAMINB12, FOLATE, FERRITIN, TIBC, IRON, RETICCTPCT in the last 72 hours. Urine analysis:    Component Value Date/Time   COLORURINE Yellow 01/22/2013 1416   APPEARANCEUR Clear 07/11/2018 1326   LABSPEC 1.011 01/22/2013 1416   PHURINE 6.0 01/22/2013 1416   GLUCOSEU Negative 07/11/2018 1326   GLUCOSEU Negative 01/22/2013 1416   HGBUR Negative 01/22/2013  1416   BILIRUBINUR Negative 07/11/2018 1326   BILIRUBINUR Negative 01/22/2013 1416   KETONESUR Negative 01/22/2013 1416   PROTEINUR Negative 07/11/2018 1326   PROTEINUR Negative 01/22/2013 1416   UROBILINOGEN 0.2 03/22/2018 1053   NITRITE Negative 07/11/2018 1326   NITRITE Negative 01/22/2013 1416   LEUKOCYTESUR 1+ (A) 07/11/2018 1326   LEUKOCYTESUR 3+ 01/22/2013 1416   Sepsis Labs: @LABRCNTIP (procalcitonin:4,lacticidven:4)  ) Recent Results (from the past 240 hour(s))  Resp Panel by RT-PCR (Flu A&B, Covid) Nasopharyngeal Swab     Status: Abnormal   Collection Time: 03/06/21 12:32 PM   Specimen: Nasopharyngeal Swab; Nasopharyngeal(NP) swabs in vial transport medium  Result Value Ref Range Status   SARS Coronavirus 2 by RT PCR POSITIVE (A) NEGATIVE Final    Comment: CRITICAL RESULT CALLED TO, READ BACK BY AND VERIFIED WITH: SONJIA WEAVER 1505 03/06/2021 DLB (NOTE) SARS-CoV-2 target nucleic acids are DETECTED.  The SARS-CoV-2 RNA is generally detectable in upper respiratory specimens during the acute phase of  infection. Positive results are indicative of the presence of the identified virus, but do not rule out bacterial infection or co-infection with other pathogens not detected by the test. Clinical correlation with patient history and other diagnostic information is necessary to determine patient infection status. The expected result is Negative.  Fact Sheet for Patients: EntrepreneurPulse.com.au  Fact Sheet for Healthcare Providers: IncredibleEmployment.be  This test is not yet approved or cleared by the [place] FDA and  has been authorized for detection and/or diagnosis of SARS-CoV-2 by FDA under an Emergency Use Authorization (EUA).  This EUA will remain in effect (meaning this test  can be used) for the duration of  the COVID-19 declaration under Section 564(b)(1) of the Act, 21 U.S.C. section 360bbb-3(b)(1), unless the  authorization is terminated or revoked sooner.     Influenza A by PCR NEGATIVE NEGATIVE Final   Influenza B by PCR NEGATIVE NEGATIVE Final    Comment: (NOTE) The Xpert Xpress SARS-CoV-2/FLU/RSV plus assay is intended as an aid in the diagnosis of influenza from Nasopharyngeal swab specimens and should not be used as a sole basis for treatment. Nasal washings and aspirates are unacceptable for Xpert Xpress SARS-CoV-2/FLU/RSV testing.  Fact Sheet for Patients: EntrepreneurPulse.com.au  Fact Sheet for Healthcare Providers: IncredibleEmployment.be  This test is not yet approved or cleared by the [place] FDA and has been authorized for detection and/or diagnosis of SARS-CoV-2 by FDA under an Emergency Use Authorization (EUA). This EUA will remain in effect (meaning this test can be used) for the duration of the COVID-19 declaration under Section 564(b)(1) of the Act, 21 U.S.C. section 360bbb-3(b)(1), unless the authorization is terminated or revoked.  Performed at [facility], [address] [place], [place] [address]   Blood culture (routine x 2)     Status: None (Preliminary result)   Collection Time: 03/06/21 12:32 PM   Specimen: BLOOD LEFT HAND  Result Value Ref Range Status   Specimen Description BLOOD LEFT HAND  Final   Special Requests   Final    BOTTLES DRAWN AEROBIC AND ANAEROBIC Blood Culture results may not be optimal due to an inadequate volume of blood received in culture bottles   Culture   Final    NO GROWTH 3 DAYS Performed at [facility], [address], [place], [place] [address]    Report Status PENDING  Incomplete  Blood culture (routine x 2)     Status: None (Preliminary result)   Collection Time: 03/06/21 12:32 PM   Specimen: BLOOD  Result Value Ref Range Status   Specimen Description BLOOD RIGHT ANTECUBITAL  Final   Special Requests   Final    BOTTLES DRAWN AEROBIC AND ANAEROBIC Blood  Culture results may not be optimal due to an inadequate volume of blood received in culture bottles   Culture   Final    NO GROWTH 3 DAYS Performed at [facility], [address], [place], [place] [address]    Report Status PENDING  Incomplete      Studies: No results found.  Scheduled Meds: . allopurinol  300 mg Oral Daily  . aspirin EC  81 mg Oral Daily  . atorvastatin  40 mg Oral QPM  . carvedilol  12.5 mg Oral BID  . clopidogrel  75 mg Oral Daily  . ferrous sulfate  27 mg Oral QHS  . furosemide  20 mg Intravenous Q12H  . levothyroxine  137 mcg Oral Q0600  . melatonin  10 mg Oral QHS  .  methylPREDNISolone (SOLU-MEDROL) injection  40 mg Intravenous Q12H  . mirtazapine  30 mg Oral QHS  . multivitamin with minerals  1 tablet Oral QPM  . pantoprazole  40 mg Oral Daily  . sacubitril-valsartan  1 tablet Oral BID  . sertraline  25 mg Oral Daily    Continuous Infusions: . remdesivir 100 mg in NS 100 mL 100 mg (03/09/21 0851)     LOS: 3 days     Annita Brod, MD [facility]   03/09/2021, 10:54 AM

## 2021-03-10 ENCOUNTER — Ambulatory Visit: Payer: Self-pay | Admitting: Family Medicine

## 2021-03-10 DIAGNOSIS — R0602 Shortness of breath: Secondary | ICD-10-CM

## 2021-03-10 DIAGNOSIS — J810 Acute pulmonary edema: Secondary | ICD-10-CM

## 2021-03-10 DIAGNOSIS — F3341 Major depressive disorder, recurrent, in partial remission: Secondary | ICD-10-CM

## 2021-03-10 LAB — BASIC METABOLIC PANEL
Anion gap: 11 (ref 5–15)
BUN: 92 mg/dL — ABNORMAL HIGH (ref 8–23)
CO2: 24 mmol/L (ref 22–32)
Calcium: 8.7 mg/dL — ABNORMAL LOW (ref 8.9–10.3)
Chloride: 105 mmol/L (ref 98–111)
Creatinine, Ser: 1.62 mg/dL — ABNORMAL HIGH (ref 0.44–1.00)
GFR, Estimated: 30 mL/min — ABNORMAL LOW (ref >60)
Glucose, Bld: 164 mg/dL — ABNORMAL HIGH (ref 70–99)
Potassium: 3.6 mmol/L (ref 3.5–5.1)
Sodium: 140 mmol/L (ref 135–145)

## 2021-03-10 LAB — C-REACTIVE PROTEIN: CRP: 0.6 mg/dL (ref <1.0)

## 2021-03-10 MED ORDER — ZOLPIDEM TARTRATE ER 6.25 MG PO TBCR: 6.2500 mg | EXTENDED_RELEASE_TABLET | Freq: Every evening | ORAL | 0 refills | Status: DC | PRN

## 2021-03-10 MED ORDER — MELATONIN 10 MG PO TABS: 10.0000 mg | ORAL_TABLET | Freq: Every day | ORAL | 0 refills | Status: DC

## 2021-03-10 MED ORDER — TORSEMIDE 20 MG PO TABS: 20.0000 mg | ORAL_TABLET | Freq: Every day | ORAL | 0 refills | Status: DC

## 2021-03-10 MED ORDER — DM-GUAIFENESIN ER 30-600 MG PO TB12: 1.0000 | ORAL_TABLET | Freq: Two times a day (BID) | ORAL | 0 refills | Status: AC | PRN

## 2021-03-10 MED ORDER — IPRATROPIUM-ALBUTEROL 20-100 MCG/ACT IN AERS
1.0000 | INHALATION_SPRAY | RESPIRATORY_TRACT | Status: DC | PRN
  Administered 2021-03-10: 1 via RESPIRATORY_TRACT
  Filled 2021-03-10: qty 4

## 2021-03-10 MED ORDER — ISOSORBIDE MONONITRATE ER 30 MG PO TB24: 30.0000 mg | ORAL_TABLET | Freq: Every day | ORAL | 0 refills | Status: DC

## 2021-03-10 MED ORDER — AMLODIPINE BESYLATE 10 MG PO TABS: 10.0000 mg | ORAL_TABLET | Freq: Every day | ORAL | 0 refills | Status: DC

## 2021-03-10 MED ORDER — SERTRALINE HCL 25 MG PO TABS: 25.0000 mg | ORAL_TABLET | Freq: Every day | ORAL | 0 refills | Status: DC

## 2021-03-10 MED ORDER — MIRTAZAPINE 30 MG PO TABS: 30.0000 mg | ORAL_TABLET | Freq: Every day | ORAL | 0 refills | Status: DC

## 2021-03-10 NOTE — Progress Notes (Signed)
[facility]) Hospital Liaison note:  This is a pending outpatient-based Palliative Care patient. Will continue to follow for disposition.  Please call with any outpatient palliative questions or concerns.  Thank you, Lorelee Market, LPN [facility] Liaison [phone]

## 2021-03-10 NOTE — TOC Transition Note (Addendum)
Transition of Care [facility]) - CM/SW Discharge Note   Patient Details  Name: Stephanie Charles MRN: [id number] Date of Birth: 08/28/1930  [facility] of [facility]) CM/SW Contact:  Alberteen Sam, LCSW Phone Number: 03/10/2021, 9:38 AM   Clinical Narrative:     Patient will DC to: Home with Home Health Anticipated DC date: 03/10/21 Family notified: [name] Transport by: daughter [name]  Per MD patient ready for DC to home with home health. RN, patient, patient's family, and facility notified of DC. Patient's daughter [name] will be picking up at discharge, RN made aware and will call after last dose of remdesivir today.   [name] and patient agreeable to home health services, no preference of agency. [name] with [facility] has accepted for home health services.  CSW signing off.  Pricilla Riffle, LCSW    Final next level of care: [place] Barriers to Discharge: No Barriers Identified   Patient Goals and CMS Choice Patient states their goals for this hospitalization and ongoing recovery are:: to go home CMS Medicare.gov Compare Post Acute Care list provided to:: Patient Choice offered to / list presented to : Patient  Discharge Placement                Patient to be transferred to facility by: daughter [name] Name of family member notified: [name] Patient and family notified of of transfer: 03/10/21  Discharge Plan and Services                          HH Arranged: PT,RN,OT,Nurse's Aide- TBD Date HH Agency Contacted: 03/10/21 [place]: [phone]  Social Determinants of Health (SDOH) Interventions     Readmission Risk Interventions No flowsheet data found.

## 2021-03-10 NOTE — Discharge Instructions (Signed)
COVID-19 Quarantine vs. Isolation QUARANTINE keeps someone who was in close contact with someone who has COVID-19 away from others. Quarantine if you have been in close contact with someone who has COVID-19, unless you have been fully vaccinated. If you are fully vaccinated  You do NOT need to quarantine unless they have symptoms  Get tested 3-5 days after your exposure, even if you don't have symptoms  Wear a mask indoors in public for 14 days following exposure or until your test result is negative If you are not fully vaccinated  Stay home for 14 days after your last contact with a person who has COVID-19  Watch for fever (100.38F), cough, shortness of breath, or other symptoms of COVID-19  If possible, stay away from people you live with, especially people who are at higher risk for getting very sick from COVID-19  Contact your local public health department for options in your area to possibly shorten your quarantine ISOLATION keeps someone who is sick or tested positive for COVID-19 without symptoms away from others, even in their own home. People who are in isolation should stay home and stay in a specific "sick room" or area and use a separate bathroom (if available). If you are sick and think or know you have COVID-19 Stay home until after  At least 10 days since symptoms first appeared and  At least 24 hours with no fever without the use of fever-reducing medications and  Symptoms have improved If you tested positive for COVID-19 but do not have symptoms  Stay home until after 10 days have passed since your positive viral test  If you develop symptoms after testing positive, follow the steps above for those who are sick [email or web address] 06/30/2020 This information is not intended to replace advice given to you by your health care provider. Make sure you discuss any questions you have with your health care provider. Document Revised: 08/04/2020 Document Reviewed:  08/04/2020 Elsevier Patient Education  2021 [place] Can Do to Manage Your COVID-19 Symptoms at Home If you have possible or confirmed COVID-19: 1. Stay home except to get medical care. 2. Monitor your symptoms carefully. If your symptoms get worse, call your healthcare provider immediately. 3. Get rest and stay hydrated. 4. If you have a medical appointment, call the healthcare provider ahead of time and tell them that you have or may have COVID-19. 5. For medical emergencies, call 911 and notify the dispatch personnel that you have or may have COVID-19. 6. Cover your cough and sneezes with a tissue or use the inside of your elbow. 7. Wash your hands often with soap and water for at least 20 seconds or clean your hands with an alcohol-based hand sanitizer that contains at least 60% alcohol. 8. As much as possible, stay in a specific room and away from other people in your home. Also, you should use a separate bathroom, if available. If you need to be around other people in or outside of the home, wear a mask. 9. Avoid sharing personal items with other people in your household, like dishes, towels, and bedding. 10. Clean all surfaces that are touched often, like counters, tabletops, and doorknobs. Use household cleaning sprays or wipes according to the label instructions. [email or web address] 04/18/2020 This information is not intended to replace advice given to you by your health care provider. Make sure you discuss any questions you have with your health care provider. Document Revised: 08/04/2020 Document Reviewed: 08/04/2020 Elsevier Patient Education  [place].

## 2021-03-10 NOTE — Care Management Important Message (Signed)
Important Message  Patient Details  Name: Stephanie Charles MRN: [id number] Date of Birth: 27-Mar-1930   Medicare Important Message Given:  Yes  Patient is in an isolation room so I talked with her by phone [phone]) and she is in agreement with her discharge plan today. I asked if she would like a copy and she replied, yes. I verified her  address and will mail her a copy as she asked.  I wished her well and thanked her for her time.  [name] 03/10/2021, 10:02 AM

## 2021-03-11 ENCOUNTER — Emergency Department: Payer: Medicare Other

## 2021-03-11 ENCOUNTER — Inpatient Hospital Stay
Admission: EM | Admit: 2021-03-11 | Discharge: 2021-03-13 | DRG: 682 | Disposition: A | Discharge status: Home Health | Payer: Medicare Other | Attending: Internal Medicine | Admitting: Internal Medicine

## 2021-03-11 ENCOUNTER — Other Ambulatory Visit: Payer: Self-pay

## 2021-03-11 DIAGNOSIS — R112 Nausea with vomiting, unspecified: Secondary | ICD-10-CM | POA: Diagnosis not present

## 2021-03-11 DIAGNOSIS — R197 Diarrhea, unspecified: Secondary | ICD-10-CM | POA: Diagnosis not present

## 2021-03-11 DIAGNOSIS — Z885 Allergy status to narcotic agent status: Secondary | ICD-10-CM | POA: Diagnosis not present

## 2021-03-11 DIAGNOSIS — N1832 Chronic kidney disease, stage 3b: Secondary | ICD-10-CM | POA: Diagnosis not present

## 2021-03-11 DIAGNOSIS — I10 Essential (primary) hypertension: Secondary | ICD-10-CM

## 2021-03-11 DIAGNOSIS — J1282 Pneumonia due to coronavirus disease 2019: Secondary | ICD-10-CM | POA: Diagnosis not present

## 2021-03-11 DIAGNOSIS — Z91040 Latex allergy status: Secondary | ICD-10-CM | POA: Diagnosis not present

## 2021-03-11 DIAGNOSIS — R1110 Vomiting, unspecified: Secondary | ICD-10-CM

## 2021-03-11 DIAGNOSIS — J9601 Acute respiratory failure with hypoxia: Secondary | ICD-10-CM | POA: Diagnosis not present

## 2021-03-11 DIAGNOSIS — Z881 Allergy status to other antibiotic agents status: Secondary | ICD-10-CM | POA: Diagnosis not present

## 2021-03-11 DIAGNOSIS — I2510 Atherosclerotic heart disease of native coronary artery without angina pectoris: Secondary | ICD-10-CM | POA: Diagnosis present

## 2021-03-11 DIAGNOSIS — Z832 Family history of diseases of the blood and blood-forming organs and certain disorders involving the immune mechanism: Secondary | ICD-10-CM

## 2021-03-11 DIAGNOSIS — I9589 Other hypotension: Secondary | ICD-10-CM | POA: Diagnosis not present

## 2021-03-11 DIAGNOSIS — R531 Weakness: Secondary | ICD-10-CM

## 2021-03-11 DIAGNOSIS — D631 Anemia in chronic kidney disease: Secondary | ICD-10-CM | POA: Diagnosis present

## 2021-03-11 DIAGNOSIS — A0472 Enterocolitis due to Clostridium difficile, not specified as recurrent: Secondary | ICD-10-CM | POA: Diagnosis present

## 2021-03-11 DIAGNOSIS — J309 Allergic rhinitis, unspecified: Secondary | ICD-10-CM | POA: Diagnosis present

## 2021-03-11 DIAGNOSIS — K219 Gastro-esophageal reflux disease without esophagitis: Secondary | ICD-10-CM | POA: Diagnosis not present

## 2021-03-11 DIAGNOSIS — I499 Cardiac arrhythmia, unspecified: Secondary | ICD-10-CM | POA: Diagnosis not present

## 2021-03-11 DIAGNOSIS — Z91041 Radiographic dye allergy status: Secondary | ICD-10-CM

## 2021-03-11 DIAGNOSIS — D696 Thrombocytopenia, unspecified: Secondary | ICD-10-CM | POA: Diagnosis present

## 2021-03-11 DIAGNOSIS — I5022 Chronic systolic (congestive) heart failure: Secondary | ICD-10-CM | POA: Diagnosis not present

## 2021-03-11 DIAGNOSIS — Z888 Allergy status to other drugs, medicaments and biological substances status: Secondary | ICD-10-CM

## 2021-03-11 DIAGNOSIS — Z820 Family history of epilepsy and other diseases of the nervous system: Secondary | ICD-10-CM

## 2021-03-11 DIAGNOSIS — Z8673 Personal history of transient ischemic attack (TIA), and cerebral infarction without residual deficits: Secondary | ICD-10-CM | POA: Diagnosis not present

## 2021-03-11 DIAGNOSIS — R55 Syncope and collapse: Secondary | ICD-10-CM

## 2021-03-11 DIAGNOSIS — Z8249 Family history of ischemic heart disease and other diseases of the circulatory system: Secondary | ICD-10-CM

## 2021-03-11 DIAGNOSIS — U071 COVID-19: Secondary | ICD-10-CM | POA: Diagnosis not present

## 2021-03-11 DIAGNOSIS — E039 Hypothyroidism, unspecified: Secondary | ICD-10-CM | POA: Diagnosis present

## 2021-03-11 DIAGNOSIS — M109 Gout, unspecified: Secondary | ICD-10-CM | POA: Diagnosis present

## 2021-03-11 DIAGNOSIS — Z823 Family history of stroke: Secondary | ICD-10-CM

## 2021-03-11 DIAGNOSIS — Z7982 Long term (current) use of aspirin: Secondary | ICD-10-CM

## 2021-03-11 DIAGNOSIS — N179 Acute kidney failure, unspecified: Secondary | ICD-10-CM | POA: Diagnosis not present

## 2021-03-11 DIAGNOSIS — Z743 Need for continuous supervision: Secondary | ICD-10-CM | POA: Diagnosis not present

## 2021-03-11 DIAGNOSIS — E785 Hyperlipidemia, unspecified: Secondary | ICD-10-CM | POA: Diagnosis present

## 2021-03-11 DIAGNOSIS — Z818 Family history of other mental and behavioral disorders: Secondary | ICD-10-CM

## 2021-03-11 DIAGNOSIS — Z79899 Other long term (current) drug therapy: Secondary | ICD-10-CM

## 2021-03-11 DIAGNOSIS — Z7902 Long term (current) use of antithrombotics/antiplatelets: Secondary | ICD-10-CM

## 2021-03-11 DIAGNOSIS — R6889 Other general symptoms and signs: Secondary | ICD-10-CM | POA: Diagnosis not present

## 2021-03-11 DIAGNOSIS — R7309 Other abnormal glucose: Secondary | ICD-10-CM | POA: Diagnosis present

## 2021-03-11 DIAGNOSIS — L8990 Pressure ulcer of unspecified site, unspecified stage: Secondary | ICD-10-CM | POA: Insufficient documentation

## 2021-03-11 DIAGNOSIS — I959 Hypotension, unspecified: Secondary | ICD-10-CM | POA: Diagnosis present

## 2021-03-11 DIAGNOSIS — I252 Old myocardial infarction: Secondary | ICD-10-CM

## 2021-03-11 DIAGNOSIS — Z974 Presence of external hearing-aid: Secondary | ICD-10-CM

## 2021-03-11 DIAGNOSIS — R404 Transient alteration of awareness: Secondary | ICD-10-CM | POA: Diagnosis not present

## 2021-03-11 DIAGNOSIS — I130 Hypertensive heart and chronic kidney disease with heart failure and stage 1 through stage 4 chronic kidney disease, or unspecified chronic kidney disease: Secondary | ICD-10-CM | POA: Diagnosis present

## 2021-03-11 DIAGNOSIS — E861 Hypovolemia: Secondary | ICD-10-CM | POA: Diagnosis not present

## 2021-03-11 DIAGNOSIS — L89151 Pressure ulcer of sacral region, stage 1: Secondary | ICD-10-CM | POA: Diagnosis present

## 2021-03-11 DIAGNOSIS — Z803 Family history of malignant neoplasm of breast: Secondary | ICD-10-CM

## 2021-03-11 DIAGNOSIS — Z96653 Presence of artificial knee joint, bilateral: Secondary | ICD-10-CM | POA: Diagnosis present

## 2021-03-11 DIAGNOSIS — R4020 Unspecified coma: Secondary | ICD-10-CM | POA: Diagnosis not present

## 2021-03-11 DIAGNOSIS — R001 Bradycardia, unspecified: Secondary | ICD-10-CM | POA: Diagnosis present

## 2021-03-11 LAB — CBC WITH DIFFERENTIAL/PLATELET
Abs Immature Granulocytes: 0.11 10*3/uL — ABNORMAL HIGH (ref 0.00–0.07)
Basophils Absolute: 0 10*3/uL (ref 0.0–0.1)
Basophils Relative: 0 %
Eosinophils Absolute: 0 10*3/uL (ref 0.0–0.5)
Eosinophils Relative: 0 %
HCT: 38.8 % (ref 36.0–46.0)
Hemoglobin: 13.3 g/dL (ref 12.0–15.0)
Immature Granulocytes: 1 %
Lymphocytes Relative: 14 %
Lymphs Abs: 1.9 10*3/uL (ref 0.7–4.0)
MCH: 32 pg (ref 26.0–34.0)
MCHC: 34.3 g/dL (ref 30.0–36.0)
MCV: 93.5 fL (ref 80.0–100.0)
Monocytes Absolute: 1.2 10*3/uL — ABNORMAL HIGH (ref 0.1–1.0)
Monocytes Relative: 8 %
Neutro Abs: 10.8 10*3/uL — ABNORMAL HIGH (ref 1.7–7.7)
Neutrophils Relative %: 77 %
Platelets: 195 10*3/uL (ref 150–400)
RBC: 4.15 MIL/uL (ref 3.87–5.11)
RDW: 14.5 % (ref 11.5–15.5)
WBC: 14 10*3/uL — ABNORMAL HIGH (ref 4.0–10.5)
nRBC: 0 % (ref 0.0–0.2)

## 2021-03-11 LAB — CBC
HCT: 40.1 % (ref 36.0–46.0)
Hemoglobin: 13.5 g/dL (ref 12.0–15.0)
MCH: 31.9 pg (ref 26.0–34.0)
MCHC: 33.7 g/dL (ref 30.0–36.0)
MCV: 94.8 fL (ref 80.0–100.0)
Platelets: 177 10*3/uL (ref 150–400)
RBC: 4.23 MIL/uL (ref 3.87–5.11)
RDW: 14.5 % (ref 11.5–15.5)
WBC: 17.3 10*3/uL — ABNORMAL HIGH (ref 4.0–10.5)
nRBC: 0 % (ref 0.0–0.2)

## 2021-03-11 LAB — COMPREHENSIVE METABOLIC PANEL
ALT: 19 U/L (ref 0–44)
AST: 33 U/L (ref 15–41)
Albumin: 3.5 g/dL (ref 3.5–5.0)
Alkaline Phosphatase: 62 U/L (ref 38–126)
Anion gap: 10 (ref 5–15)
BUN: 101 mg/dL — ABNORMAL HIGH (ref 8–23)
CO2: 25 mmol/L (ref 22–32)
Calcium: 8.1 mg/dL — ABNORMAL LOW (ref 8.9–10.3)
Chloride: 104 mmol/L (ref 98–111)
Creatinine, Ser: 2.12 mg/dL — ABNORMAL HIGH (ref 0.44–1.00)
GFR, Estimated: 22 mL/min — ABNORMAL LOW (ref >60)
Glucose, Bld: 130 mg/dL — ABNORMAL HIGH (ref 70–99)
Potassium: 4.2 mmol/L (ref 3.5–5.1)
Sodium: 139 mmol/L (ref 135–145)
Total Bilirubin: 1.4 mg/dL — ABNORMAL HIGH (ref 0.3–1.2)
Total Protein: 5.6 g/dL — ABNORMAL LOW (ref 6.5–8.1)

## 2021-03-11 LAB — CULTURE, BLOOD (ROUTINE X 2)
Culture: NO GROWTH
Culture: NO GROWTH

## 2021-03-11 LAB — T4, FREE: Free T4: 1.25 ng/dL — ABNORMAL HIGH (ref 0.61–1.12)

## 2021-03-11 LAB — LIPASE, BLOOD: Lipase: 31 U/L (ref 11–51)

## 2021-03-11 LAB — TROPONIN I (HIGH SENSITIVITY)
Troponin I (High Sensitivity): 35 ng/L — ABNORMAL HIGH (ref <18)
Troponin I (High Sensitivity): 33 ng/L — ABNORMAL HIGH (ref <18)

## 2021-03-11 LAB — MAGNESIUM: Magnesium: 2.1 mg/dL (ref 1.7–2.4)

## 2021-03-11 MED ORDER — ZOLPIDEM TARTRATE 5 MG PO TABS
5.0000 mg | ORAL_TABLET | Freq: Every evening | ORAL | Status: DC | PRN
  Administered 2021-03-11 – 2021-03-12 (×2): 5 mg via ORAL
  Filled 2021-03-11 (×2): qty 1

## 2021-03-11 MED ORDER — ONDANSETRON HCL 4 MG PO TABS: 4.0000 mg | ORAL_TABLET | Freq: Four times a day (QID) | ORAL | Status: DC | PRN

## 2021-03-11 MED ORDER — ONDANSETRON HCL 4 MG/2ML IJ SOLN: 4.0000 mg | Freq: Four times a day (QID) | INTRAMUSCULAR | Status: DC | PRN

## 2021-03-11 MED ORDER — LEVOTHYROXINE SODIUM 137 MCG PO TABS
137.0000 ug | ORAL_TABLET | Freq: Every day | ORAL | Status: DC
  Administered 2021-03-12 – 2021-03-13 (×2): 137 ug via ORAL
  Filled 2021-03-11 (×3): qty 1

## 2021-03-11 MED ORDER — SERTRALINE HCL 50 MG PO TABS
25.0000 mg | ORAL_TABLET | Freq: Every day | ORAL | Status: DC
  Administered 2021-03-12 – 2021-03-13 (×2): 25 mg via ORAL
  Filled 2021-03-11 (×2): qty 1

## 2021-03-11 MED ORDER — ATORVASTATIN CALCIUM 20 MG PO TABS
40.0000 mg | ORAL_TABLET | Freq: Every day | ORAL | Status: DC
  Administered 2021-03-11 – 2021-03-12 (×2): 40 mg via ORAL
  Filled 2021-03-11 (×2): qty 2

## 2021-03-11 MED ORDER — LACTATED RINGERS IV BOLUS
1000.0000 mL | Freq: Once | INTRAVENOUS | Status: AC
  Administered 2021-03-11: 1000 mL via INTRAVENOUS

## 2021-03-11 MED ORDER — HEPARIN SODIUM (PORCINE) 5000 UNIT/ML IJ SOLN
5000.0000 [IU] | Freq: Three times a day (TID) | INTRAMUSCULAR | Status: DC
  Administered 2021-03-11 – 2021-03-13 (×4): 5000 [IU] via SUBCUTANEOUS
  Filled 2021-03-11 (×4): qty 1

## 2021-03-11 MED ORDER — ACETAMINOPHEN 325 MG PO TABS: 650.0000 mg | ORAL_TABLET | Freq: Four times a day (QID) | ORAL | Status: DC | PRN

## 2021-03-11 MED ORDER — LACTATED RINGERS IV BOLUS
500.0000 mL | Freq: Once | INTRAVENOUS | Status: AC
  Administered 2021-03-11: 500 mL via INTRAVENOUS

## 2021-03-11 MED ORDER — CLOPIDOGREL BISULFATE 75 MG PO TABS
75.0000 mg | ORAL_TABLET | Freq: Every day | ORAL | Status: DC
  Administered 2021-03-12 – 2021-03-13 (×2): 75 mg via ORAL
  Filled 2021-03-11 (×2): qty 1

## 2021-03-11 MED ORDER — PANTOPRAZOLE SODIUM 40 MG PO TBEC
40.0000 mg | DELAYED_RELEASE_TABLET | Freq: Every day | ORAL | Status: DC
  Administered 2021-03-12 – 2021-03-13 (×2): 40 mg via ORAL
  Filled 2021-03-11 (×2): qty 1

## 2021-03-11 MED ORDER — SODIUM CHLORIDE 0.9 % IV SOLN: INTRAVENOUS | Status: DC

## 2021-03-11 MED ORDER — ASPIRIN EC 81 MG PO TBEC
81.0000 mg | DELAYED_RELEASE_TABLET | Freq: Every day | ORAL | Status: DC
  Administered 2021-03-12 – 2021-03-13 (×2): 81 mg via ORAL
  Filled 2021-03-11 (×2): qty 1

## 2021-03-11 MED ORDER — MELATONIN 5 MG PO TABS
10.0000 mg | ORAL_TABLET | Freq: Every day | ORAL | Status: DC
  Administered 2021-03-11 – 2021-03-12 (×2): 10 mg via ORAL
  Filled 2021-03-11 (×2): qty 2

## 2021-03-11 MED ORDER — ACETAMINOPHEN 650 MG RE SUPP: 650.0000 mg | Freq: Four times a day (QID) | RECTAL | Status: DC | PRN

## 2021-03-11 MED ORDER — MIRTAZAPINE 15 MG PO TABS
30.0000 mg | ORAL_TABLET | Freq: Every day | ORAL | Status: DC
  Administered 2021-03-11 – 2021-03-12 (×2): 30 mg via ORAL
  Filled 2021-03-11 (×2): qty 2

## 2021-03-11 NOTE — ED Notes (Signed)
EDP at bedside talking with pt.  

## 2021-03-11 NOTE — ED Notes (Addendum)
Spoke with Ulis Rias, daughter of pt. [phone]. She states that other daughter of pt said that pt could not stand on her own and when was vomiting, and she was concerned that she might have aspirated because pt was "choking and couldn't breathe".

## 2021-03-11 NOTE — ED Notes (Signed)
Patient ready for transport to room.  Myrtie Hawk, RN received report.

## 2021-03-11 NOTE — ED Notes (Signed)
Patient 87-89% on RA. Placed on 2L with improvement to 95% prior to transport to the floor.

## 2021-03-11 NOTE — ED Triage Notes (Signed)
Pt to ED for NVD that started today and bradycardia with 1 episode of unresponsiveness while sitting on toilet, daughter witnessed. Did not fall.  Discharged yesterday from [facility], covid positive  Pt was bradycardic (mid 40s) with many multifocal and couplet PVCs with EMS no ST changes on 12 lead Pt received 0.5mg  atropine, HR then found to be in 60s and 70s 94 SBP improved to 127/51 after atropine Pt also received 4mg  zofran

## 2021-03-11 NOTE — ED Notes (Signed)
Dinner tray given to pt

## 2021-03-11 NOTE — H&P (Addendum)
History and Physical    Stephanie Charles:[id number] DOB: [birth year] DOA: 03/11/2021  PCP: Jerrol Banana., MD    Patient coming from:  Home   Chief Complaint:  Vomiting and diarrhea   HPI: Stephanie Charles is a 87 y.o. female seen in ed with complaints of vomiting and diarrhea that started when she went home yesterday.  Patient was recently discharged yesterday after being admitted on 03/06/2021 for shortness of breath and receiving treatment for COVID-pneumonia. While in-house patient received therapy with remdesivir and steroids.  Patient also found to be in volume overload and started on Lasix.  Patient's last echocardiogram was in December 2020 which showed an ejection fraction of 40 to 45%.  Patient had multiple episodes of profuse watery diarrhea since coming home last night along with vomiting.  Patient also had a syncopal episode while on the commode that was witnessed by the daughter.  Patient brought back by family member as she was feeling weak and dizzy.  Pt has past medical history of hypertension, hyperlipidemia, TIA, GERD, hypothyroidism, gout, depression, CAD status post post stent, anemia, congestive heart failure with a EF of 40 to 45%, CKD stage III.  ED Course:  Vitals:   03/11/21 1519 03/11/21 1525 03/11/21 1648  BP:  (!) 122/55 (!) 88/44  Pulse:  74 (!) 48  Resp:  16 17  Temp:  97.8 F (36.6 C)   TempSrc:  Oral   SpO2:  92% 91%  Weight: 70.3 kg    Height: 5\' 2"  (1.575 m)    In the emergency room patient is alert awake afebrile and oxygenating well on room air. Labs show white count of 14, otherwise normal hemoglobin and platelet count.  CMP shows a glucose of 130, creatinine of 2.12, total bili of 1.4 otherwise normal.  Initial troponin of 35.  Review of Systems:  Review of Systems  Constitutional: Positive for malaise/fatigue.  HENT: Negative.   Eyes: Negative.   Respiratory: Positive for cough.   Cardiovascular: Negative.   Gastrointestinal:  Positive for diarrhea, nausea and vomiting.  Genitourinary: Negative.   Musculoskeletal: Negative.   Neurological: Negative.   Endo/Heme/Allergies: Negative.    Past Medical History:  Diagnosis Date  . Anemia   . Diverticulitis   . GERD (gastroesophageal reflux disease)   . Hyperlipidemia   . Hypertension   . Hypothyroidism   . Myocardial infarction ([place]) 08/2017  . TIA (transient ischemic attack)    1 approx 2015, 1 approx 2018  . Vertigo    last episode several months ago  . [name]     Past Surgical History:  Procedure Laterality Date  . COLONOSCOPY WITH PROPOFOL N/A 06/22/2018   Procedure: COLONOSCOPY WITH PROPOFOL;  Surgeon: Jonathon Bellows, MD;  Location: [facility] ENDOSCOPY;  Service: Endoscopy;  Laterality: N/A;  . CORONARY STENT INTERVENTION N/A 06/07/2019   Procedure: CORONARY STENT INTERVENTION;  Surgeon: [name], MD;  Location: [facility] CV LAB;  Service: Cardiovascular;  Laterality: N/A;  . ESOPHAGOGASTRODUODENOSCOPY (EGD) WITH PROPOFOL N/A 06/22/2018   Procedure: ESOPHAGOGASTRODUODENOSCOPY (EGD) WITH PROPOFOL;  Surgeon: Jonathon Bellows, MD;  Location: [facility] ENDOSCOPY;  Service: Endoscopy;  Laterality: N/A;  . KNEE ARTHROSCOPY Right   . REPLACEMENT TOTAL KNEE BILATERAL    . RIGHT/LEFT HEART CATH AND CORONARY ANGIOGRAPHY N/A 06/07/2019   Procedure: RIGHT/LEFT HEART CATH AND CORONARY ANGIOGRAPHY;  Surgeon: [name], MD;  Location: [place] [facility] LAB;  Service: Cardiovascular;  Laterality: N/A;  .  SHOULDER SURGERY Left      reports that she has never smoked. She has never used smokeless tobacco. She reports that she does not drink alcohol and does not use drugs.  Allergies  Allergen Reactions  . Latex Itching  . Levofloxacin     Mouth sores  . Lisinopril     Cough?  . Povidone-Iodine Other (See Comments)    Severe blistering and itchiness. Severe redness  . Simvastatin     Myalgias  . Codeine Nausea And Vomiting    Family  History  Problem Relation Age of Onset  . Heart disease Mother   . Hypertension Mother   . Stroke Mother   . Heart disease Father   . Breast cancer Sister   . Alzheimer's disease Brother   . Heart attack Brother   . Stroke Brother   . Heart disease Brother   . Clotting disorder Daughter   . Mental illness Brother        died suicide  . Cancer Brother        prostate  . Heart disease Brother        atrial fib  . Alzheimer's disease Brother   . Fibromyalgia Daughter     Prior to Admission medications   Medication Sig Start Date End Date Taking? Authorizing Provider  allopurinol (ZYLOPRIM) 300 MG tablet Take 1 tablet by mouth once daily 07/28/20   Jerrol Banana., MD  amLODipine (NORVASC) 10 MG tablet Take 1 tablet (10 mg total) by mouth daily. 03/10/21   Max Sane, MD  aspirin EC 81 MG EC tablet Take 1 tablet (81 mg total) by mouth daily. 06/09/19   Nicholes Mango, MD  atorvastatin (LIPITOR) 40 MG tablet Take 1 tablet by mouth once daily Patient taking differently: Take 40 mg by mouth every evening. 07/28/20   Jerrol Banana., MD  carvedilol (COREG) 12.5 MG tablet Take 1 tablet by mouth twice daily 07/01/20   Jerrol Banana., MD  clopidogrel (PLAVIX) 75 MG tablet Take 1 tablet by mouth once daily 09/08/20   Jerrol Banana., MD  dextromethorphan-guaiFENesin [facility] DM) 30-600 MG 12hr tablet Take 1 tablet by mouth every 12 (twelve) hours as needed for up to 7 days for cough. 03/10/21 03/17/21  Max Sane, MD  Ferrous Sulfate 27 MG TABS Take 27 mg by mouth at bedtime.    [provider]  isosorbide mononitrate (IMDUR) 30 MG 24 hr tablet Take 1 tablet (30 mg total) by mouth daily. 03/10/21   Max Sane, MD  levothyroxine (SYNTHROID) 137 MCG tablet Take 1 tablet by mouth once daily Patient taking differently: Take 137 mcg by mouth every evening. 07/28/20   Jerrol Banana., MD  Melatonin 10 MG TABS Take 10 mg by mouth at bedtime. 03/10/21   Max Sane,  MD  mirtazapine (REMERON) 30 MG tablet Take 1 tablet (30 mg total) by mouth at bedtime. 03/10/21   Max Sane, MD  Multiple Vitamin (MULTIVITAMIN) capsule Take 1 capsule by mouth every evening.    [provider]  pantoprazole (PROTONIX) 40 MG tablet Take 1 tablet (40 mg total) by mouth daily. 09/11/20   Jerrol Banana., MD  sacubitril-valsartan (ENTRESTO) 49-51 MG Take 1 tablet by mouth 2 (two) times daily. 09/30/20   [name], MD  sertraline (ZOLOFT) 25 MG tablet Take 1 tablet (25 mg total) by mouth daily. 03/10/21   Max Sane, MD  torsemide (DEMADEX) 20 MG tablet Take  1 tablet (20 mg total) by mouth daily. 03/10/21   Max Sane, MD  zolpidem (AMBIEN CR) 6.25 MG CR tablet Take 1 tablet (6.25 mg total) by mouth at bedtime as needed for sleep. 03/10/21   Max Sane, MD    Physical Exam: Vitals:   03/11/21 1519 03/11/21 1525 03/11/21 1648  BP:  (!) 122/55 (!) 88/44  Pulse:  74 (!) 48  Resp:  16 17  Temp:  97.8 F (36.6 C)   TempSrc:  Oral   SpO2:  92% 91%  Weight: 70.3 kg    Height: 5\' 2"  (1.575 m)     Physical Exam Vitals and nursing note reviewed.  Constitutional:      General: She is not in acute distress.    Appearance: She is obese. She is ill-appearing.  HENT:     Head: Normocephalic and atraumatic.     Right Ear: External ear normal.     Left Ear: External ear normal.     Nose: Nose normal.     Mouth/Throat:     Mouth: Mucous membranes are dry.  Eyes:     Extraocular Movements: Extraocular movements intact.     Pupils: Pupils are equal, round, and reactive to light.  Cardiovascular:     Rate and Rhythm: Regular rhythm. Tachycardia present.     Pulses: Normal pulses.     Heart sounds: No murmur heard.   Pulmonary:     Effort: Pulmonary effort is normal.     Breath sounds: Normal breath sounds.  Abdominal:     General: Bowel sounds are normal. There is no distension.     Palpations: Abdomen is soft.     Tenderness: There is no abdominal  tenderness. There is no guarding.  Musculoskeletal:     Right lower leg: No edema.     Left lower leg: No edema.  Skin:    General: Skin is warm.  Neurological:     General: No focal deficit present.     Mental Status: She is alert and oriented to person, place, and time.     Cranial Nerves: Cranial nerves are intact. No cranial nerve deficit.     Motor: Motor function is intact. No weakness.  Psychiatric:        Mood and Affect: Mood normal.        Behavior: Behavior normal.      Labs on Admission: I have personally reviewed following labs and imaging studies  No results for input(s): CKTOTAL, CKMB, TROPONINI in the last 72 hours. Lab Results  Component Value Date   WBC 14.0 (H) 03/11/2021   HGB 13.3 03/11/2021   HCT 38.8 03/11/2021   MCV 93.5 03/11/2021   PLT 195 03/11/2021    Recent Labs  Lab 03/11/21 1545  NA 139  K 4.2  CL 104  CO2 25  BUN 101*  CREATININE 2.12*  CALCIUM 8.1*  PROT 5.6*  BILITOT 1.4*  ALKPHOS 62  ALT 19  AST 33  GLUCOSE 130*   Lab Results  Component Value Date   CHOL 118 03/07/2021   HDL 33 (L) 03/07/2021   LDLCALC 68 03/07/2021   TRIG 83 03/07/2021   Lab Results  Component Value Date   DDIMER 1.83 (H) 03/07/2021   Invalid input(s): POCBNP  Urinalysis    Component Value Date/Time   COLORURINE Yellow 01/22/2013 1416   APPEARANCEUR Clear 07/11/2018 1326   LABSPEC 1.011 01/22/2013 1416   PHURINE 6.0 01/22/2013 1416   GLUCOSEU Negative 07/11/2018  1326   GLUCOSEU Negative 01/22/2013 1416   HGBUR Negative 01/22/2013 1416   BILIRUBINUR Negative 07/11/2018 1326   BILIRUBINUR Negative 01/22/2013 1416   KETONESUR Negative 01/22/2013 1416   PROTEINUR Negative 07/11/2018 1326   PROTEINUR Negative 01/22/2013 1416   UROBILINOGEN 0.2 03/22/2018 1053   NITRITE Negative 07/11/2018 1326   NITRITE Negative 01/22/2013 1416   LEUKOCYTESUR 1+ (A) 07/11/2018 1326   LEUKOCYTESUR 3+ 01/22/2013 1416   COVID-19 Labs Recent Labs     03/09/21 0623 03/10/21 0617  CRP 0.6 0.6    Lab Results  Component Value Date   SARSCOV2NAA POSITIVE (A) 03/06/2021   [place] NEGATIVE 06/06/2019    Radiological Exams on Admission: DG Chest Portable 1 View  Result Date: 03/11/2021 CLINICAL DATA:  Weakness.  COVID positive. EXAM: PORTABLE CHEST 1 VIEW COMPARISON:  Radiograph 03/06/2021 FINDINGS: Persistent low lung volumes. Stable heart size and mediastinal contours. Aortic atherosclerosis. Streaky left lung base opacity. Slight improvement in diffuse interstitial opacities. No pleural effusion or pneumothorax. Stable osseous structures. IMPRESSION: Low lung volumes with streaky left lung base opacity, atelectasis versus pneumonia. Slight improvement in diffuse interstitial opacities from recent exam. Electronically Signed   By: Keith Rake M.D.   On: 03/11/2021 16:16    EKG: Independently reviewed.  Shows sinus rhythm with left bundle branch block.   Assessment/Plan Principal Problem:   Vomiting and diarrhea Active Problems:   Abnormal blood sugar   Allergic rhinitis   Benign essential HTN   Acid reflux   HLD (hyperlipidemia)   Adult hypothyroidism   Thrombocytopenia (HCC)   Vomiting/ Diarrhea: Continue with supportive care with IVF/ IV PPI  antipyretic and antiemetic.  Clear liquid diet as tolerated. Suspect GI symptoms to be part of covid illness And therefore we will place pt in covid and enteric isolation.   Abnormal Glucose: Last a1c is normal.  Allergic rhinitis: Claritin 10 mg qhs.   HTN: Blood pressure (!) 88/44, pulse (!) 48, temperature 97.8 F (36.6 C), temperature source Oral, resp. rate 17, height 5\' 2"  (1.575 m), weight 70.3 kg, SpO2 91 %. due to low bp home meds have been held including coreg/ torsemide/ amlodipine/imdur/entresto.resume once euvolemic. Cont ivf and strict I/O.  GERD: IV PPI  Hyperlipidemia: Cont pt on atorvastatin.  Hypothyroidism: Cont levothyroxine and get  ft4/tsh.  Thrombocytopenia.  Follow platelet levels. Attribute to her covid illness.  AKI; Lab Results  Component Value Date   CREATININE 2.12 (H) 03/11/2021   CREATININE 1.62 (H) 03/10/2021   CREATININE 1.52 (H) 03/09/2021  hold diuretic therapy and monitor for resolution and also avoid contrast studies and renally dose all meds.      DVT prophylaxis:  Heparin  Code Status:  Full Code   Family Communication:  Overton Mam (Daughter)  [phone] (Mobile)  Disposition Plan:  Home   Consults called:  None  Admission status: Inpatient     Para Skeans MD [facility] [phone] How to contact the [facility] Attending or Consulting provider [place] or covering provider during after hours [place], for this patient.    1. Check the care team in [facility] and look for a) attending/consulting [place] provider listed and b) the [facility] team listed 2. Log into [email or web address] and use [place]'s universal password to access. If you do not have the password, please contact the hospital operator. 3. Locate the [facility] provider you are looking for under [facility] and page to a number that you can be directly reached. 4. If you  still have difficulty reaching the provider, please page the [facility] (Director on Call) for the Hospitalists listed on amion for assistance. [email or web address] Password [facility] 03/11/2021, 5:17 PM

## 2021-03-11 NOTE — ED Notes (Signed)
Has covid, symptoms not new

## 2021-03-11 NOTE — ED Provider Notes (Signed)
[facility] Emergency Department Provider Note   ____________________________________________   Event Date/Time   First MD Initiated Contact with Patient 03/11/21 1514     (approximate)  I have reviewed the triage vital signs and the nursing notes.   HISTORY  Chief Complaint NVD and Bradycardia    HPI Stephanie Charles is a 87 y.o. female with past medical history of hypertension, hyperlipidemia, TIA, GERD, CAD, gout, CHF, and CKD who presents to the ED for syncope.  Patient was discharged from the hospital yesterday following admission for generalized weakness secondary to COVID-19.  She states that she felt well when she was discharged but developed nausea with vomiting last night.  She had multiple episodes of profuse watery diarrhea this morning, but denies any blood in her stool.  She went to sit on the commode just prior to arrival and had a syncopal episode that was witnessed by daughter.  Patient denies any associated chest pain and states she has not had any difficulty breathing since leaving the hospital.  She denies any fevers or cough.  She denies any abdominal pain with her vomiting and diarrhea.  She was given 500 mL of IV fluids with EMS along with 4 mg IV Zofran.  She was also noted to be bradycardic into the 40s, given 0.5 mg of atropine with improvement in heart rate.  Patient continues to feel weak and dizzy upon arrival to the ED.        Past Medical History:  Diagnosis Date  . Anemia   . Diverticulitis   . GERD (gastroesophageal reflux disease)   . Hyperlipidemia   . Hypertension   . Hypothyroidism   . Myocardial infarction ([place]) 08/2017  . TIA (transient ischemic attack)    1 approx 2015, 1 approx 2018  . Vertigo    last episode several months ago  . [name]     Patient Active Problem List   Diagnosis Date Noted  . Acute pulmonary edema (HCC)   . Shortness of breath   . Acute on chronic combined systolic  and diastolic CHF (congestive heart failure) ([place]) 03/06/2021  . Acute on chronic combined systolic and diastolic congestive heart failure ([place]) 03/06/2021  . COVID-19 virus infection 03/06/2021  . Hypothyroidism   . TIA (transient ischemic attack)   . CAD (coronary artery disease)   . CKD (chronic kidney disease), stage IIIa   . Elevated troponin   . Thrombocytopenia ([place])   . Unstable angina ([place]) 06/07/2019  . Non-ST elevation (NSTEMI) myocardial infarction ([place])   . Chest pain 06/06/2019  . 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 Diverticulitis 04/13/2018  . 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 11/12/2017  . Ankle edema, bilateral 05/04/2017  . Abnormal blood sugar 03/11/2015  . Allergic rhinitis 03/11/2015  . Absolute anemia 03/11/2015  . Arthropathia 03/11/2015  . Benign essential HTN 03/11/2015  . Benign essential tremor 03/11/2015  . Artery disease, cerebral 03/11/2015  . Chronic venous insufficiency 03/11/2015  . Clinical depression 03/11/2015  . Urinary system disease 03/11/2015  . Acid reflux 03/11/2015  . HLD (hyperlipidemia) 03/11/2015  . Adult hypothyroidism 03/11/2015  . Irritable colon 03/11/2015  . Cannot sleep 03/11/2015  . Weak pulse 03/11/2015  . Carotid stenosis 03/11/2015  . Temporary cerebral vascular dysfunction 03/11/2015  . External carotid artery stenosis 07/12/2014    Past Surgical History:  Procedure Laterality Date  . COLONOSCOPY WITH PROPOFOL N/A 06/22/2018   Procedure: COLONOSCOPY  WITH PROPOFOL;  Surgeon: Jonathon Bellows, MD;  Location: [facility] ENDOSCOPY;  Service: Endoscopy;  Laterality: N/A;  . CORONARY STENT INTERVENTION N/A 06/07/2019   Procedure: CORONARY STENT INTERVENTION;  Surgeon: [name], MD;  Location: [facility] CV LAB;  Service: Cardiovascular;  Laterality: N/A;  . ESOPHAGOGASTRODUODENOSCOPY (EGD) WITH PROPOFOL N/A 06/22/2018   Procedure: ESOPHAGOGASTRODUODENOSCOPY (EGD) WITH PROPOFOL;  Surgeon:  Jonathon Bellows, MD;  Location: [facility] ENDOSCOPY;  Service: Endoscopy;  Laterality: N/A;  . KNEE ARTHROSCOPY Right   . REPLACEMENT TOTAL KNEE BILATERAL    . RIGHT/LEFT HEART CATH AND CORONARY ANGIOGRAPHY N/A 06/07/2019   Procedure: RIGHT/LEFT HEART CATH AND CORONARY ANGIOGRAPHY;  Surgeon: [name], MD;  Location: [place] CV LAB;  Service: Cardiovascular;  Laterality: N/A;  . SHOULDER SURGERY Left     Prior to Admission medications   Medication Sig Start Date End Date Taking? Authorizing Provider  allopurinol (ZYLOPRIM) 300 MG tablet Take 1 tablet by mouth once daily 07/28/20   Jerrol Banana., MD  amLODipine (NORVASC) 10 MG tablet Take 1 tablet (10 mg total) by mouth daily. 03/10/21   Max Sane, MD  aspirin EC 81 MG EC tablet Take 1 tablet (81 mg total) by mouth daily. 06/09/19   Nicholes Mango, MD  atorvastatin (LIPITOR) 40 MG tablet Take 1 tablet by mouth once daily Patient taking differently: Take 40 mg by mouth every evening. 07/28/20   Jerrol Banana., MD  carvedilol (COREG) 12.5 MG tablet Take 1 tablet by mouth twice daily 07/01/20   Jerrol Banana., MD  clopidogrel (PLAVIX) 75 MG tablet Take 1 tablet by mouth once daily 09/08/20   Jerrol Banana., MD  dextromethorphan-guaiFENesin [facility] DM) 30-600 MG 12hr tablet Take 1 tablet by mouth every 12 (twelve) hours as needed for up to 7 days for cough. 03/10/21 03/17/21  Max Sane, MD  Ferrous Sulfate 27 MG TABS Take 27 mg by mouth at bedtime.    [provider]  isosorbide mononitrate (IMDUR) 30 MG 24 hr tablet Take 1 tablet (30 mg total) by mouth daily. 03/10/21   Max Sane, MD  levothyroxine (SYNTHROID) 137 MCG tablet Take 1 tablet by mouth once daily Patient taking differently: Take 137 mcg by mouth every evening. 07/28/20   Jerrol Banana., MD  Melatonin 10 MG TABS Take 10 mg by mouth at bedtime. 03/10/21   Max Sane, MD  mirtazapine (REMERON) 30 MG tablet Take 1 tablet (30 mg total) by mouth  at bedtime. 03/10/21   Max Sane, MD  Multiple Vitamin (MULTIVITAMIN) capsule Take 1 capsule by mouth every evening.    [provider]  pantoprazole (PROTONIX) 40 MG tablet Take 1 tablet (40 mg total) by mouth daily. 09/11/20   Jerrol Banana., MD  sacubitril-valsartan (ENTRESTO) 49-51 MG Take 1 tablet by mouth 2 (two) times daily. 09/30/20   [name], MD  sertraline (ZOLOFT) 25 MG tablet Take 1 tablet (25 mg total) by mouth daily. 03/10/21   Max Sane, MD  torsemide (DEMADEX) 20 MG tablet Take 1 tablet (20 mg total) by mouth daily. 03/10/21   Max Sane, MD  zolpidem (AMBIEN CR) 6.25 MG CR tablet Take 1 tablet (6.25 mg total) by mouth at bedtime as needed for sleep. 03/10/21   Max Sane, MD    Allergies Latex, Levofloxacin, Lisinopril, Povidone-iodine, Simvastatin, and Codeine  Family History  Problem Relation Age of Onset  . Heart disease Mother   . Hypertension Mother   .  Stroke Mother   . Heart disease Father   . Breast cancer Sister   . Alzheimer's disease Brother   . Heart attack Brother   . Stroke Brother   . Heart disease Brother   . Clotting disorder Daughter   . Mental illness Brother        died suicide  . Cancer Brother        prostate  . Heart disease Brother        atrial fib  . Alzheimer's disease Brother   . Fibromyalgia Daughter     Social History Social History   Tobacco Use  . Smoking status: Never Smoker  . Smokeless tobacco: Never Used  Vaping Use  . Vaping Use: Never used  Substance Use Topics  . Alcohol use: No    Alcohol/week: 0.0 standard drinks  . Drug use: No    Review of Systems  Constitutional: No fever/chills Eyes: No visual changes. ENT: No sore throat. Cardiovascular: Denies chest pain.  Positive for syncope. Respiratory: Denies shortness of breath. Gastrointestinal: No abdominal pain.  Positive for nausea, vomiting, and diarrhea.  No constipation. Genitourinary: Negative for dysuria. Musculoskeletal:  Negative for back pain. Skin: Negative for rash. Neurological: Negative for headaches, focal weakness or numbness.  ____________________________________________   PHYSICAL EXAM:  VITAL SIGNS: ED Triage Vitals  Enc Vitals Group     BP --      Pulse --      Resp --      Temp --      Temp src --      SpO2 --      Weight 03/11/21 1519 155 lb (70.3 kg)     Height 03/11/21 1519 5\' 2"  (1.575 m)     Head Circumference --      Peak Flow --      Pain Score 03/11/21 1513 0     Pain Loc --      Pain Edu? --      Excl. in [place]? --     Constitutional: Alert and oriented.  Somnolent but arousable to voice. Eyes: Conjunctivae are normal. Head: Atraumatic. Nose: No congestion/rhinnorhea. Mouth/Throat: Mucous membranes are moist. Neck: Normal ROM Cardiovascular: Normal rate, regular rhythm. Grossly normal heart sounds.  2+ radial pulses bilaterally. Respiratory: Normal respiratory effort.  No retractions. Lungs CTAB. Gastrointestinal: Soft and nontender. No distention. Genitourinary: deferred Musculoskeletal: No lower extremity tenderness nor edema. Neurologic:  Normal speech and language. No gross focal neurologic deficits are appreciated. Skin:  Skin is warm, dry and intact. No rash noted. Psychiatric: Mood and affect are normal. Speech and behavior are normal.  ____________________________________________   LABS (all labs ordered are listed, but only abnormal results are displayed)  Labs Reviewed  CBC WITH DIFFERENTIAL/PLATELET - Abnormal; Notable for the following components:      Result Value   WBC 14.0 (*)    Neutro Abs 10.8 (*)    Monocytes Absolute 1.2 (*)    Abs Immature Granulocytes 0.11 (*)    All other components within normal limits  COMPREHENSIVE METABOLIC PANEL - Abnormal; Notable for the following components:   Glucose, Bld 130 (*)    BUN 101 (*)    Creatinine, Ser 2.12 (*)    Calcium 8.1 (*)    Total Protein 5.6 (*)    Total Bilirubin 1.4 (*)    GFR,  Estimated 22 (*)    All other components within normal limits  TROPONIN I (HIGH SENSITIVITY) - Abnormal; Notable for the following  components:   Troponin I (High Sensitivity) 35 (*)    All other components within normal limits  LIPASE, BLOOD  MAGNESIUM  URINALYSIS, COMPLETE (UACMP) WITH MICROSCOPIC   ____________________________________________  EKG  ED ECG REPORT I, Blake Divine, the attending physician, personally viewed and interpreted this ECG.   Date: 03/11/2021  EKG Time: 15:19  Rate: 68  Rhythm: normal sinus rhythm, PVC  Axis: LAD  Intervals:right bundle branch block  ST&T Change: None, negative sgarbossa   PROCEDURES  Procedure(s) performed (including Critical Care):  Procedures   ____________________________________________   INITIAL IMPRESSION / ASSESSMENT AND PLAN / ED COURSE       87 year old female with past medical history of hypertension, hyperlipidemia, CAD, CHF, CKD, TIA, gout, and GERD who presents to the ED for syncopal episode while sitting on the commode just prior to arrival.  Patient denies any chest pain or shortness of breath, but she has had vomiting and diarrhea since leaving the hospital yesterday following admission for COVID-19.  She is not in any respiratory distress and is maintaining O2 sats on room air.  She previously retrieved treatment with steroids and remdesivir.  She did require significant diuresis while in the hospital and she may now be dehydrated given recent vomiting and diarrhea.  She was given 500 cc of IV fluids with EMS and we will cautiously rehydrate with additional 500 cc.  EKG shows left bundle branch block similar to previous, however patient noted to had frequent PVCs on monitor.  We will check electrolytes and troponin but anticipate readmission given her syncopal episode.  Electrolytes within normal limits but patient noted to have AKI compared to recent labs during admission.  Patient now with borderline low blood  pressure, likely due to dehydration and we will continue IV fluid resuscitation.  No further syncopal episodes or chest pain here in the ED.  Case discussed with hospitalist for admission.      ____________________________________________   FINAL CLINICAL IMPRESSION(S) / ED DIAGNOSES  Final diagnoses:  COVID-19  Syncope, unspecified syncope type  Generalized weakness  AKI (acute kidney injury) [facility] [address])     ED Discharge Orders    None       Note:  This document was prepared using Dragon voice recognition software and may include unintentional dictation errors.   Blake Divine, MD 03/11/21 [phone]

## 2021-03-12 ENCOUNTER — Encounter: Payer: Self-pay | Admitting: Internal Medicine

## 2021-03-12 LAB — COMPREHENSIVE METABOLIC PANEL
ALT: 13 U/L (ref 0–44)
AST: 35 U/L (ref 15–41)
Albumin: 3 g/dL — ABNORMAL LOW (ref 3.5–5.0)
Alkaline Phosphatase: 55 U/L (ref 38–126)
Anion gap: 7 (ref 5–15)
BUN: 95 mg/dL — ABNORMAL HIGH (ref 8–23)
CO2: 24 mmol/L (ref 22–32)
Calcium: 7.8 mg/dL — ABNORMAL LOW (ref 8.9–10.3)
Chloride: 110 mmol/L (ref 98–111)
Creatinine, Ser: 1.73 mg/dL — ABNORMAL HIGH (ref 0.44–1.00)
GFR, Estimated: 28 mL/min — ABNORMAL LOW (ref >60)
Glucose, Bld: 130 mg/dL — ABNORMAL HIGH (ref 70–99)
Potassium: 4 mmol/L (ref 3.5–5.1)
Sodium: 141 mmol/L (ref 135–145)
Total Bilirubin: 1.3 mg/dL — ABNORMAL HIGH (ref 0.3–1.2)
Total Protein: 4.9 g/dL — ABNORMAL LOW (ref 6.5–8.1)

## 2021-03-12 LAB — MAGNESIUM: Magnesium: 2 mg/dL (ref 1.7–2.4)

## 2021-03-12 LAB — PHOSPHORUS: Phosphorus: 4.8 mg/dL — ABNORMAL HIGH (ref 2.5–4.6)

## 2021-03-12 MED ORDER — DEXAMETHASONE 6 MG PO TABS
6.0000 mg | ORAL_TABLET | Freq: Every day | ORAL | Status: DC
  Administered 2021-03-12 – 2021-03-13 (×2): 6 mg via ORAL
  Filled 2021-03-12 (×2): qty 1

## 2021-03-12 MED ORDER — SODIUM CHLORIDE 0.9 % IV SOLN: INTRAVENOUS | Status: AC

## 2021-03-12 NOTE — Progress Notes (Addendum)
Progress Note    Stephanie Charles  PPJ:[id number] DOB: 13-Jun-1930  DOA: 03/11/2021 PCP: Jerrol Banana., MD      Brief Narrative:    Medical records reviewed and are as summarized below:  Stephanie Charles is a 87 y.o. female with medical history significant for hypertension, TIA, chronic systolic CHF (EF 40 to 58%,) CKD stage IIIb, hypothyroidism, recent discharge from the hospital after hospitalization from 03/06/2021 through 03/10/2021 for acute on chronic CHF and COVID-19 infection.  She presented to the hospital again on 03/06/2021 because of nausea, vomiting, diarrhea and syncope.  Reportedly, she passed out while sitting on the toilet and she was bradycardic. She was hypotensive and hypoxic in the emergency room.  She was given 1 dose of IV atropine in the emergency room.  She was found to have AKI and hypotension likely from vomiting and diarrhea.  Vomiting and diarrhea may be due to COVID-19 infection.  She was treated with IV fluids.  Assessment/Plan:   Principal Problem:   Vomiting and diarrhea Active Problems:   Abnormal blood sugar   Allergic rhinitis   Benign essential HTN   Acid reflux   HLD (hyperlipidemia)   Adult hypothyroidism   Thrombocytopenia (HCC)   Pressure injury of skin    Body mass index is 28.35 kg/m.   S/p syncope: Probably due to hypotension.  Consult PT and OT  Vomiting and diarrhea: Improved.  This is probably from COVID-19 infection.  Antiemetics as needed.  Continue supportive care.  Start diet.  Hypotension: BP is better.  Continue IV fluids.  AKI on CKD stage IIIb: Creatinine is improving.  Continue IV fluids and monitor BMP.  COVID-19 pneumonia: Initial positive test was on 03/06/2021.  Recently treated with IV remdesivir and steroids.  Restart steroids because of hypoxia.  Acute hypoxic respiratory failure: Continue 2 L/min oxygen via nasal cannula and taper off oxygen as able.  Chronic systolic CHF: Lasix on hold.  Stage I  sacral decubitus ulcer (present on admission): Apply Mepilex border.  Plan of care was discussed with her daughter, Ulis Rias, over the phone.    Diet Order             Diet Heart Room service appropriate? Yes; Fluid consistency: Thin  Diet effective now                      Consultants: None  Procedures: None    Medications:    aspirin EC  81 mg Oral Daily   atorvastatin  40 mg Oral QHS   clopidogrel  75 mg Oral Daily   heparin  5,000 Units Subcutaneous Q8H   levothyroxine  137 mcg Oral Daily   melatonin  10 mg Oral QHS   mirtazapine  30 mg Oral QHS   pantoprazole  40 mg Oral Daily   sertraline  25 mg Oral Daily   Continuous Infusions:  sodium chloride 50 mL/hr at 03/12/21 0853     Anti-infectives (From admission, onward)    None              Family Communication/Anticipated D/C date and plan/Code Status   DVT prophylaxis: heparin injection 5,000 Units Start: 03/11/21 1730 SCDs Start: 03/11/21 1709     Code Status: Full Code  Family Communication: Plan discussed with her daughter, Ulis Rias Disposition Plan:    Status is: Inpatient  Remains inpatient appropriate because:IV treatments appropriate due to intensity of illness or inability to take PO  Dispo: The patient is from: Home              Anticipated d/c is to: Home              Patient currently is not medically stable to d/c.   Difficult to place patient No           Subjective:   C/o nausea.  No vomiting or diarrhea today.  Objective:    Vitals:   03/11/21 2023 03/12/21 0603 03/12/21 0746 03/12/21 1112  BP: (!) 136/59 (!) 113/51 (!) 122/52 (!) 126/50  Pulse: 67 62 63 60  Resp: 20 18 18 18   Temp: 97.7 F (36.5 C) 98.4 F (36.9 C) 97.9 F (36.6 C) 97.7 F (36.5 C)  TempSrc: Oral Oral    SpO2: 95% 95% 94% 96%  Weight:      Height:       No data found.   Intake/Output Summary (Last 24 hours) at 03/12/2021 1249 Last data filed at 03/12/2021 1110 Gross per 24  hour  Intake 2065.02 ml  Output 1850 ml  Net 215.02 ml   Filed Weights   03/11/21 1519  Weight: 70.3 kg    Exam:  GEN: NAD SKIN: Warm and dry.  Stage I sacral decubitus ulcer EYES: EOMI ENT: MMM CV: RRR PULM: CTA B ABD: soft, ND, NT, +BS CNS: AAO x 3, non focal EXT: No edema or tenderness    Pressure Injury 03/11/21 Sacrum Right;Left Stage 1 -  Intact skin with non-blanchable redness of a localized area usually over a bony prominence. (Active)  03/11/21 1724  Location: Sacrum  Location Orientation: Right;Left  Staging: Stage 1 -  Intact skin with non-blanchable redness of a localized area usually over a bony prominence.  Wound Description (Comments):   Present on Admission: Yes     Data Reviewed:   I have personally reviewed following labs and imaging studies:  Labs: Labs show the following:   Basic Metabolic Panel: Recent Labs  Lab 03/07/21 0514 03/08/21 0503 03/09/21 0623 03/10/21 0617 03/11/21 1545 03/12/21 0436  NA 139 141 139 140 139 141  K 3.2* 3.6 3.2* 3.6 4.2 4.0  CL 105 106 103 105 104 110  CO2 23 25 26 24 25 24   GLUCOSE 138* 157* 143* 164* 130* 130*  BUN 19 44* 79* 92* 101* 95*  CREATININE 0.90 1.41* 1.52* 1.62* 2.12* 1.73*  CALCIUM 8.8* 8.8* 8.5* 8.7* 8.1* 7.8*  MG 1.8  --   --   --  2.1 2.0  PHOS  --   --   --   --   --  4.8*   GFR Estimated Creatinine Clearance: 20.7 mL/min (A) (by C-G formula based on SCr of 1.73 mg/dL (H)). Liver Function Tests: Recent Labs  Lab 03/07/21 0514 03/08/21 0503 03/09/21 0623 03/11/21 1545 03/12/21 0436  AST 23 23 19  33 35  ALT 24 23 19 19 13   ALKPHOS 96 84 76 62 55  BILITOT 0.6 0.7 0.7 1.4* 1.3*  PROT 6.6 6.4* 6.1* 5.6* 4.9*  ALBUMIN 3.9 3.7 3.6 3.5 3.0*   Recent Labs  Lab 03/11/21 1545  LIPASE 31   No results for input(s): AMMONIA in the last 168 hours. Coagulation profile No results for input(s): INR, PROTIME in the last 168 hours.  CBC: Recent Labs  Lab 03/06/21 1033 03/09/21 0623  03/11/21 1545 03/11/21 1830  WBC 7.5 11.8* 14.0* 17.3*  NEUTROABS  --  10.3* 10.8*  --   HGB 11.9*  13.6 13.3 13.5  HCT 35.7* 39.9 38.8 40.1  MCV 95.2 94.1 93.5 94.8  PLT 116* 170 195 177   Cardiac Enzymes: No results for input(s): CKTOTAL, CKMB, CKMBINDEX, TROPONINI in the last 168 hours. BNP (last 3 results) No results for input(s): PROBNP in the last 8760 hours. CBG: No results for input(s): GLUCAP in the last 168 hours. D-Dimer: No results for input(s): DDIMER in the last 72 hours. Hgb A1c: No results for input(s): HGBA1C in the last 72 hours. Lipid Profile: No results for input(s): CHOL, HDL, LDLCALC, TRIG, CHOLHDL, LDLDIRECT in the last 72 hours. Thyroid function studies: No results for input(s): TSH, T4TOTAL, T3FREE, THYROIDAB in the last 72 hours.  Invalid input(s): FREET3 Anemia work up: No results for input(s): VITAMINB12, FOLATE, FERRITIN, TIBC, IRON, RETICCTPCT in the last 72 hours. Sepsis Labs: Recent Labs  Lab 03/06/21 1033 03/06/21 1716 03/09/21 0623 03/11/21 1545 03/11/21 1830  PROCALCITON  --  <0.10  --   --   --   WBC 7.5  --  11.8* 14.0* 17.3*    Microbiology Recent Results (from the past 240 hour(s))  Resp Panel by RT-PCR (Flu A&B, Covid) Nasopharyngeal Swab     Status: Abnormal   Collection Time: 03/06/21 12:32 PM   Specimen: Nasopharyngeal Swab; Nasopharyngeal(NP) swabs in vial transport medium  Result Value Ref Range Status   SARS Coronavirus 2 by RT PCR POSITIVE (A) NEGATIVE Final    Comment: CRITICAL RESULT CALLED TO, READ BACK BY AND VERIFIED WITH: SONJIA WEAVER 1505 03/06/2021 DLB (NOTE) SARS-CoV-2 target nucleic acids are DETECTED.  The SARS-CoV-2 RNA is generally detectable in upper respiratory specimens during the acute phase of infection. Positive results are indicative of the presence of the identified virus, but do not rule out bacterial infection or co-infection with other pathogens not detected by the test. Clinical correlation  with patient history and other diagnostic information is necessary to determine patient infection status. The expected result is Negative.  Fact Sheet for Patients: EntrepreneurPulse.com.au  Fact Sheet for Healthcare Providers: IncredibleEmployment.be  This test is not yet approved or cleared by the [place] FDA and  has been authorized for detection and/or diagnosis of SARS-CoV-2 by FDA under an Emergency Use Authorization (EUA).  This EUA will remain in effect (meaning this test  can be used) for the duration of  the COVID-19 declaration under Section 564(b)(1) of the Act, 21 U.S.C. section 360bbb-3(b)(1), unless the authorization is terminated or revoked sooner.     Influenza A by PCR NEGATIVE NEGATIVE Final   Influenza B by PCR NEGATIVE NEGATIVE Final    Comment: (NOTE) The Xpert Xpress SARS-CoV-2/FLU/RSV plus assay is intended as an aid in the diagnosis of influenza from Nasopharyngeal swab specimens and should not be used as a sole basis for treatment. Nasal washings and aspirates are unacceptable for Xpert Xpress SARS-CoV-2/FLU/RSV testing.  Fact Sheet for Patients: EntrepreneurPulse.com.au  Fact Sheet for Healthcare Providers: IncredibleEmployment.be  This test is not yet approved or cleared by the [place] FDA and has been authorized for detection and/or diagnosis of SARS-CoV-2 by FDA under an Emergency Use Authorization (EUA). This EUA will remain in effect (meaning this test can be used) for the duration of the COVID-19 declaration under Section 564(b)(1) of the Act, 21 U.S.C. section 360bbb-3(b)(1), unless the authorization is terminated or revoked.  Performed at [facility], [address] [place], [place] [address]   Blood culture (routine x 2)     Status: None   Collection  Time: 03/06/21 12:32 PM   Specimen: BLOOD LEFT HAND  Result Value Ref Range Status    Specimen Description BLOOD LEFT HAND  Final   Special Requests   Final    BOTTLES DRAWN AEROBIC AND ANAEROBIC Blood Culture results may not be optimal due to an inadequate volume of blood received in culture bottles   Culture   Final    NO GROWTH 5 DAYS Performed at [facility], [address] [place], [place] [address]    Report Status 03/11/2021 FINAL  Final  Blood culture (routine x 2)     Status: None   Collection Time: 03/06/21 12:32 PM   Specimen: BLOOD  Result Value Ref Range Status   Specimen Description BLOOD RIGHT ANTECUBITAL  Final   Special Requests   Final    BOTTLES DRAWN AEROBIC AND ANAEROBIC Blood Culture results may not be optimal due to an inadequate volume of blood received in culture bottles   Culture   Final    NO GROWTH 5 DAYS Performed at [facility], [address] [place], [place] [address]    Report Status 03/11/2021 FINAL  Final    Procedures and diagnostic studies:  DG Chest Portable 1 View  Result Date: 03/11/2021 CLINICAL DATA:  Weakness.  COVID positive. EXAM: PORTABLE CHEST 1 VIEW COMPARISON:  Radiograph 03/06/2021 FINDINGS: Persistent low lung volumes. Stable heart size and mediastinal contours. Aortic atherosclerosis. Streaky left lung base opacity. Slight improvement in diffuse interstitial opacities. No pleural effusion or pneumothorax. Stable osseous structures. IMPRESSION: Low lung volumes with streaky left lung base opacity, atelectasis versus pneumonia. Slight improvement in diffuse interstitial opacities from recent exam. Electronically Signed   By: Keith Rake M.D.   On: 03/11/2021 16:16               LOS: 1 day   Mitchell Epling  [facility]   Pager on [email or web address]. If 7PM-7AM, please contact night-coverage at [email or web address]     03/12/2021, 12:49 PM

## 2021-03-12 NOTE — Care Management Important Message (Signed)
Important Message  Patient Details  Name: Stephanie Charles MRN: [id number] Date of Birth: [birth year]   Medicare Important Message Given:  N/A - LOS <3 / Initial given by admissions  Initial Medicare IM reviewed with patient by S. Geanie Cooley, Patient Access Associate on 03/12/2021 at 9:28am.    Dannette Barbara 03/12/2021, 3:15 PM

## 2021-03-12 NOTE — Evaluation (Signed)
Occupational Therapy Evaluation Patient Details Name: Stephanie Charles MRN: [id number] DOB: 01-07-1930 Today's Date: 03/12/2021    History of Present Illness Stephanie Charles is a 87 y.o. female seen in ed with complaints of vomiting and diarrhea that started when she went home yesterday.  Patient was recently discharged yesterday after being admitted on 03/06/2021 for shortness of breath and receiving treatment for COVID-pneumonia.   Clinical Impression   Stephanie Charles was seen for OT evaluation this date. Prior to hospital admission, pt was Independent for mobility and ADLs. Pt lives with daughter in home c 1 STE. Pt presents to acute OT demonstrating impaired ADL performance and functional mobility 2/2 decreased activity tolerance and functional strength/balance deficits. Pt currently requires SBA + RW for toilet t/f - assist for lines mgmt. Pt tolerated ~8 mins standing grooming c SUPERVISION. MOD I for LBD seated EOB. SpO2 93% on RA during toilet t/f.  Pt would benefit from skilled OT to address noted impairments and functional limitations (see below for any additional details) in order to maximize safety and independence while minimizing falls risk and caregiver burden. Upon hospital discharge, recommend HHOT to maximize pt safety and return to functional independence during meaningful occupations of daily life.     Follow Up Recommendations  Home health OT    Equipment Recommendations  None recommended by OT    Recommendations for Other Services       Precautions / Restrictions Precautions Precautions: Fall Precaution Comments: COVID positive Restrictions Weight Bearing Restrictions: No      Mobility Bed Mobility Overal bed mobility: Modified Independent             General bed mobility comments: extra time required to complete tasks. no phyiscal assistance required    Transfers Overall transfer level: Needs assistance Equipment used: Rolling walker (2 wheeled) Transfers:  Sit to/from Stand Sit to Stand: Supervision         General transfer comment: VCs to use grab bar to stand from low toilet height    Balance Overall balance assessment: Needs assistance Sitting-balance support: Feet supported Sitting balance-Leahy Scale: Good     Standing balance support: No upper extremity supported;During functional activity Standing balance-Leahy Scale: Fair Standing balance comment: reaching inside BOS                           ADL either performed or assessed with clinical judgement   ADL Overall ADL's : Needs assistance/impaired                                       General ADL Comments: SBA + RW for toilet t/f and hand washing standing sinkside - assist for lines mgmt. MOD I for LBD seated EOB.      Pertinent Vitals/Pain Pain Assessment: No/denies pain     Hand Dominance     Extremity/Trunk Assessment Upper Extremity Assessment Upper Extremity Assessment: Overall WFL for tasks assessed   Lower Extremity Assessment Lower Extremity Assessment: Generalized weakness       Communication Communication Communication: No difficulties   Cognition Arousal/Alertness: Awake/alert Behavior During Therapy: WFL for tasks assessed/performed Overall Cognitive Status: Within Functional Limits for tasks assessed  General Comments  SpO2 93% on RA during toilet t/f    Exercises Exercises: Other exercises Other Exercises Other Exercises: Pt educated re; OT role, DME recs, d/c recs, falls prevention, ECS Other Exercises: LBD, toileting, tooth brushing, hand washing, sup<>sit, sit<>stand,,,, sitting/standing blaance/tolerance   Shoulder Instructions      Home Living Family/patient expects to be discharged to:: Private residence Living Arrangements: Children Available Help at Discharge: Family Type of Home: House Home Access: Stairs to enter Technical brewer of Steps:  1   Home Layout: One level     Bathroom Shower/Tub: Tub/shower unit;Walk-in shower         Home Equipment: Walker - 2 wheels          Prior Functioning/Environment Level of Independence: Independent        Comments: Patient reports she is independent with activity and ADLs, drives. Daughter lives with her but does not provide assistance per patient report. Using RW last ~week since COV+        OT Problem List: Decreased strength;Decreased activity tolerance;Impaired balance (sitting and/or standing);Decreased knowledge of use of DME or AE      OT Treatment/Interventions: Self-care/ADL training;Therapeutic exercise;Energy conservation;DME and/or AE instruction;Therapeutic activities;Patient/family education;Balance training    OT Goals(Current goals can be found in the care plan section) Acute Rehab OT Goals Patient Stated Goal: to go home OT Goal Formulation: With patient Time For Goal Achievement: 03/26/21 Potential to Achieve Goals: Good ADL Goals Pt Will Perform Grooming: Independently;standing (pt will toelrate >15 min standing grooming task) Pt Will Transfer to Toilet: Independently;ambulating;regular height toilet (c LRAD PRN) Additional ADL Goal #1: Pt will verbalize plan to implement x3 ECS  OT Frequency: Min 1X/week    AM-PAC OT "6 Clicks" Daily Activity     Outcome Measure Help from another person eating meals?: None Help from another person taking care of personal grooming?: A Little Help from another person toileting, which includes using toliet, bedpan, or urinal?: A Little Help from another person bathing (including washing, rinsing, drying)?: A Little Help from another person to put on and taking off regular upper body clothing?: None Help from another person to put on and taking off regular lower body clothing?: A Little 6 Click Score: 20   End of Session Equipment Utilized During Treatment: Rolling walker Nurse Communication: Mobility  status  Activity Tolerance: Patient tolerated treatment well Patient left: in bed;with call bell/phone within reach;with bed alarm set  OT Visit Diagnosis: Other abnormalities of gait and mobility (R26.89)                Time: 7253-6644 OT Time Calculation (min): 40 min Charges:  OT General Charges $OT Visit: 1 Visit OT Evaluation $OT Eval Low Complexity: 1 Low OT Treatments $Self Care/Home Management : 23-37 mins  Dessie Coma, M.S. OTR/L  03/12/21, 3:38 PM  ascom [phone]

## 2021-03-13 ENCOUNTER — Other Ambulatory Visit: Payer: Self-pay | Admitting: Family Medicine

## 2021-03-13 DIAGNOSIS — N179 Acute kidney failure, unspecified: Principal | ICD-10-CM

## 2021-03-13 DIAGNOSIS — I959 Hypotension, unspecified: Secondary | ICD-10-CM | POA: Diagnosis present

## 2021-03-13 DIAGNOSIS — I9589 Other hypotension: Secondary | ICD-10-CM

## 2021-03-13 DIAGNOSIS — E861 Hypovolemia: Secondary | ICD-10-CM

## 2021-03-13 LAB — BASIC METABOLIC PANEL
Anion gap: 7 (ref 5–15)
BUN: 68 mg/dL — ABNORMAL HIGH (ref 8–23)
CO2: 24 mmol/L (ref 22–32)
Calcium: 8.4 mg/dL — ABNORMAL LOW (ref 8.9–10.3)
Chloride: 112 mmol/L — ABNORMAL HIGH (ref 98–111)
Creatinine, Ser: 1.52 mg/dL — ABNORMAL HIGH (ref 0.44–1.00)
GFR, Estimated: 33 mL/min — ABNORMAL LOW (ref >60)
Glucose, Bld: 181 mg/dL — ABNORMAL HIGH (ref 70–99)
Potassium: 4.2 mmol/L (ref 3.5–5.1)
Sodium: 143 mmol/L (ref 135–145)

## 2021-03-13 LAB — TSH: TSH: 0.052 u[IU]/mL — ABNORMAL LOW (ref 0.350–4.500)

## 2021-03-13 LAB — CBC WITH DIFFERENTIAL/PLATELET
Abs Immature Granulocytes: 0.13 10*3/uL — ABNORMAL HIGH (ref 0.00–0.07)
Basophils Absolute: 0 10*3/uL (ref 0.0–0.1)
Basophils Relative: 0 %
Eosinophils Absolute: 0 10*3/uL (ref 0.0–0.5)
Eosinophils Relative: 0 %
HCT: 38.7 % (ref 36.0–46.0)
Hemoglobin: 13.1 g/dL (ref 12.0–15.0)
Immature Granulocytes: 1 %
Lymphocytes Relative: 7 %
Lymphs Abs: 0.7 10*3/uL (ref 0.7–4.0)
MCH: 31.6 pg (ref 26.0–34.0)
MCHC: 33.9 g/dL (ref 30.0–36.0)
MCV: 93.3 fL (ref 80.0–100.0)
Monocytes Absolute: 0.1 10*3/uL (ref 0.1–1.0)
Monocytes Relative: 1 %
Neutro Abs: 8.5 10*3/uL — ABNORMAL HIGH (ref 1.7–7.7)
Neutrophils Relative %: 91 %
Platelets: 144 10*3/uL — ABNORMAL LOW (ref 150–400)
RBC: 4.15 MIL/uL (ref 3.87–5.11)
RDW: 14.2 % (ref 11.5–15.5)
WBC: 9.4 10*3/uL (ref 4.0–10.5)
nRBC: 0 % (ref 0.0–0.2)

## 2021-03-13 MED ORDER — AMLODIPINE BESYLATE 10 MG PO TABS
10.0000 mg | ORAL_TABLET | Freq: Every day | ORAL | Status: DC
  Administered 2021-03-13: 10 mg via ORAL
  Filled 2021-03-13: qty 1

## 2021-03-13 MED ORDER — CARVEDILOL 12.5 MG PO TABS: 12.5000 mg | ORAL_TABLET | Freq: Two times a day (BID) | ORAL | Status: DC

## 2021-03-13 MED ORDER — CARVEDILOL 12.5 MG PO TABS
12.5000 mg | ORAL_TABLET | Freq: Two times a day (BID) | ORAL | Status: DC
  Administered 2021-03-13: 12.5 mg via ORAL
  Filled 2021-03-13: qty 1

## 2021-03-13 MED ORDER — LEVOTHYROXINE SODIUM 137 MCG PO TABS: 137.0000 ug | ORAL_TABLET | Freq: Every evening | ORAL | Status: DC

## 2021-03-13 MED ORDER — ATORVASTATIN CALCIUM 40 MG PO TABS: 40.0000 mg | ORAL_TABLET | Freq: Every evening | ORAL | Status: DC

## 2021-03-13 MED ORDER — CLOPIDOGREL BISULFATE 75 MG PO TABS: 75.0000 mg | ORAL_TABLET | Freq: Every day | ORAL | Status: DC

## 2021-03-13 MED ORDER — ISOSORBIDE MONONITRATE ER 30 MG PO TB24
30.0000 mg | ORAL_TABLET | Freq: Every day | ORAL | Status: DC
  Administered 2021-03-13: 30 mg via ORAL
  Filled 2021-03-13: qty 1

## 2021-03-13 NOTE — Evaluation (Signed)
Physical Therapy Evaluation Patient Details Name: Stephanie Charles MRN: [id number] DOB: October 17, 1929 Today's Date: 03/13/2021   History of Present Illness  presented to ER secondary to persistent nausea/vomiting, weakness/dizziness and syncopal episode; admitted for management of AKI.  Of note, recently hospitalized due to COVID-19 6/3-03/10/21.  Clinical Impression  Patient resting in bed upon arrival to room; alert and oriented, follows commands and agreeable to participation with session. Hopeful for discharge this PM.  Denies pain; does endorse feeling much better since admission.  Bilat UE/LE strength and ROM grossly symmetrical and WFL; no focal weakness appreciated.  Able to complete bed mobility with mod indep; sit/stand, basic transfers and gait (50') with RW, cga/close sup.  Demonstrates reciprocal stepping pattern with fair step height/length; broad turning radius, but no overt buckling or LOB.  Good awareness of safety needs throughout Trialed on RA during session-sats maintained >92% at rest and with exertion throughout session.  Left on RA end of session; RN informed/aware for continued monitoring. Would benefit from skilled PT to address above deficits and promote optimal return to PLOF; Recommend transition to [place] upon discharge from acute hospitalization.     Follow Up Recommendations Home health PT;Supervision - Intermittent    Equipment Recommendations  None recommended by PT (has RW at home)    Recommendations for Other Services       Precautions / Restrictions Precautions Precautions: Fall Precaution Comments: COVID positive Restrictions Weight Bearing Restrictions: No      Mobility  Bed Mobility Overal bed mobility: Modified Independent Bed Mobility: Supine to Sit;Sit to Supine     Supine to sit: Modified independent (Device/Increase time) Sit to supine: Modified independent (Device/Increase time)        Transfers Overall transfer level: Needs  assistance Equipment used: Rolling walker (2 wheeled) Transfers: Sit to/from Stand Sit to Stand: Supervision         General transfer comment: cuing for hand placement to prevent pulling on RW  Ambulation/Gait Ambulation/Gait assistance: Min guard;Supervision Gait Distance (Feet): 50 Feet Assistive device: Rolling walker (2 wheeled)       General Gait Details: reciprocal stepping pattern with fair step height/length; broad turning radius, but no overt buckling or LOB.  Good awareness of safety needs throughout  Stairs            Wheelchair Mobility    Modified Rankin (Stroke Patients Only)       Balance Overall balance assessment: Needs assistance Sitting-balance support: No upper extremity supported;Feet supported Sitting balance-Leahy Scale: Good     Standing balance support: Bilateral upper extremity supported Standing balance-Leahy Scale: Fair                               Pertinent Vitals/Pain Pain Assessment: No/denies pain    Home Living Family/patient expects to be discharged to:: Private residence Living Arrangements: Children Available Help at Discharge: Family Type of Home: House Home Access: Stairs to enter   Technical brewer of Steps: 1 Home Layout: One level Home Equipment: Walker - 2 wheels      Prior Function Level of Independence: Independent         Comments: Patient reports she is independent with activity and ADLs, drives. Daughter lives with her but does not provide assistance per patient report. Using RW last ~week since COV+     Hand Dominance        Extremity/Trunk Assessment   Upper Extremity Assessment Upper Extremity Assessment: Overall  WFL for tasks assessed    Lower Extremity Assessment Lower Extremity Assessment: Overall WFL for tasks assessed       Communication   Communication: No difficulties  Cognition Arousal/Alertness: Awake/alert Behavior During Therapy: WFL for tasks  assessed/performed Overall Cognitive Status: Within Functional Limits for tasks assessed                                        General Comments      Exercises Other Exercises Other Exercises: Toilet transfer, ambulatory with RW, cga/close sup; sit/stand from standard height toilet, sup with grab bar.  Standing balance at sink for hand hygiene, light grooming/oral care, sup; good awareness of limits of stability.  Functional reach approx 4-5" from immediate BOS, using contralateral UE for external stabilization as needed   Assessment/Plan    PT Assessment Patient needs continued PT services  PT Problem List Decreased strength;Decreased activity tolerance;Decreased balance;Decreased mobility;Decreased safety awareness;Cardiopulmonary status limiting activity       PT Treatment Interventions DME instruction;Gait training;Stair training;Functional mobility training;Therapeutic activities;Therapeutic exercise;Balance training;Neuromuscular re-education;Patient/family education    PT Goals (Current goals can be found in the Care Plan section)  Acute Rehab PT Goals Patient Stated Goal: to go home PT Goal Formulation: With patient Time For Goal Achievement: 03/22/21 Potential to Achieve Goals: Good    Frequency Min 2X/week   Barriers to discharge        Co-evaluation               AM-PAC PT "6 Clicks" Mobility  Outcome Measure Help needed turning from your back to your side while in a flat bed without using bedrails?: None Help needed moving from lying on your back to sitting on the side of a flat bed without using bedrails?: A Little Help needed moving to and from a bed to a chair (including a wheelchair)?: A Little Help needed standing up from a chair using your arms (e.g., wheelchair or bedside chair)?: A Little Help needed to walk in hospital room?: A Little Help needed climbing 3-5 steps with a railing? : A Little 6 Click Score: 19    End of Session  Equipment Utilized During Treatment: Gait belt Activity Tolerance: Patient tolerated treatment well Patient left: in chair;with call bell/phone within reach;with chair alarm set Nurse Communication: Mobility status PT Visit Diagnosis: Unsteadiness on feet (R26.81);Muscle weakness (generalized) (M62.81)    Time: 8756-4332 PT Time Calculation (min) (ACUTE ONLY): 32 min   Charges:   PT Evaluation $PT Eval Moderate Complexity: 1 Mod PT Treatments $Therapeutic Activity: 8-22 mins        Cong Hightower H. Owens Shark, PT, DPT, NCS 03/13/21, 10:49 AM [phone]

## 2021-03-13 NOTE — Telephone Encounter (Signed)
  Notes to clinic: review for refill Requested medication shows that it has been d/c    Requested Prescriptions  Pending Prescriptions Disp Refills   amitriptyline (ELAVIL) 10 MG tablet [Pharmacy Med Name: Amitriptyline HCl 10 MG Oral Tablet] 30 tablet 0    Sig: TAKE 1 TABLET BY MOUTH AT BEDTIME      Psychiatry:  Antidepressants - Heterocyclics (TCAs) Failed - 03/13/2021  2:29 PM      Failed - Valid encounter within last 6 months    Recent Outpatient Visits           6 months ago Benign essential HTN   [facility] Jerrol Banana., MD   9 months ago Benign essential HTN   [facility] Jerrol Banana., MD   1 year ago LLQ abdominal pain   [place], [place], [place]   1 year ago Non-ST elevation (NSTEMI) myocardial infarction [facility])   [facility] Jerrol Banana., MD   1 year ago Iron deficiency anemia due to chronic blood loss   [facility] Jerrol Banana., MD       Future Appointments             In 1 week Gollan, Kathlene November, MD [facility], [place]   In 1 month Jerrol Banana., MD [facility], [facility]   In 2 months Gollan, Kathlene November, MD [facility] [place], [facility]             Passed - Completed PHQ-2 or PHQ-9 in the last 360 days

## 2021-03-13 NOTE — Discharge Summary (Addendum)
Physician Discharge Summary  Stephanie Charles YKZ:[id number] DOB: 19-Jul-1930 DOA: 03/11/2021  PCP: Jerrol Banana., MD  Admit date: 03/11/2021 Discharge date: 03/13/2021  Discharge disposition: Home with home health therapy   Recommendations for Outpatient Follow-Up:   Follow-up with PCP in 1 week   Discharge Diagnosis:   Principal Problem:   Vomiting and diarrhea Active Problems:   Abnormal blood sugar   Allergic rhinitis   Acid reflux   HLD (hyperlipidemia)   Adult hypothyroidism   Pressure injury of skin   AKI (acute kidney injury) ([place])   Hypotension    Discharge Condition: Stable.  Diet recommendation:  Diet Order             Diet - low sodium heart healthy           Diet Heart Room service appropriate? Yes; Fluid consistency: Thin  Diet effective now                     Code Status: Full Code     Hospital Course:    Stephanie Charles is a 87 y.o. female with medical history significant for hypertension, TIA, chronic systolic CHF (EF 40 to 93%,) CKD stage IIIb, hypothyroidism, recent discharge from the hospital after hospitalization from 03/06/2021 through 03/10/2021 for acute on chronic CHF and COVID-19 infection.  She presented to the hospital again on 03/06/2021 because of nausea, vomiting, diarrhea and syncope.  Reportedly, she passed out while sitting on the toilet and she was bradycardic. She was hypotensive and hypoxic in the emergency room.  She was given 1 dose of IV atropine in the emergency room.   She was found to have AKI and hypotension likely from vomiting and diarrhea.  Vomiting and diarrhea may be due to COVID-19 infection.  She was treated with IV fluids, antiemetics, steroids and oxygen via nasal cannula.  Torsemide and antihypertensives were held.  Her condition has improved.  She was successfully weaned off of oxygen to room air.  She is still stable for discharge to home today.  Mended home health therapy.    Discharge Exam:     Vitals:   03/12/21 2207 03/13/21 0503 03/13/21 0807 03/13/21 1144  BP: (!) 153/62 (!) 161/61 (!) 145/71 (!) 126/55  Pulse: 88 72 75 72  Resp: 18 18 18 18   Temp: 98.9 F (37.2 C) 98.4 F (36.9 C) 98.2 F (36.8 C) 97.7 F (36.5 C)  TempSrc:   Oral   SpO2: 93% 96% 95% 95%  Weight:      Height:         GEN: NAD SKIN: Warm and dry EYES: EOMI ENT: MMM CV: RRR PULM: CTA B ABD: soft, ND, NT, +BS CNS: AAO x 3, non focal EXT: No edema or tenderness   The results of significant diagnostics from this hospitalization (including imaging, microbiology, ancillary and laboratory) are listed below for reference.     Procedures and Diagnostic Studies:   DG Chest Portable 1 View  Result Date: 03/11/2021 CLINICAL DATA:  Weakness.  COVID positive. EXAM: PORTABLE CHEST 1 VIEW COMPARISON:  Radiograph 03/06/2021 FINDINGS: Persistent low lung volumes. Stable heart size and mediastinal contours. Aortic atherosclerosis. Streaky left lung base opacity. Slight improvement in diffuse interstitial opacities. No pleural effusion or pneumothorax. Stable osseous structures. IMPRESSION: Low lung volumes with streaky left lung base opacity, atelectasis versus pneumonia. Slight improvement in diffuse interstitial opacities from recent exam. Electronically Signed   By: Aurther Loft.D.  On: 03/11/2021 16:16     Labs:   Basic Metabolic Panel: Recent Labs  Lab 03/07/21 0514 03/08/21 0503 03/09/21 6503 03/10/21 0617 03/11/21 1545 03/12/21 0436 03/13/21 0527  NA 139   < > 139 140 139 141 143  K 3.2*   < > 3.2* 3.6 4.2 4.0 4.2  CL 105   < > 103 105 104 110 112*  CO2 23   < > 26 24 25 24 24   GLUCOSE 138*   < > 143* 164* 130* 130* 181*  BUN 19   < > 79* 92* 101* 95* 68*  CREATININE 0.90   < > 1.52* 1.62* 2.12* 1.73* 1.52*  CALCIUM 8.8*   < > 8.5* 8.7* 8.1* 7.8* 8.4*  MG 1.8  --   --   --  2.1 2.0  --   PHOS  --   --   --   --   --  4.8*  --    < > = values in this interval not displayed.    GFR Estimated Creatinine Clearance: 23.5 mL/min (A) (by C-G formula based on SCr of 1.52 mg/dL (H)). Liver Function Tests: Recent Labs  Lab 03/07/21 0514 03/08/21 0503 03/09/21 0623 03/11/21 1545 03/12/21 0436  AST 23 23 19  33 35  ALT 24 23 19 19 13   ALKPHOS 96 84 76 62 55  BILITOT 0.6 0.7 0.7 1.4* 1.3*  PROT 6.6 6.4* 6.1* 5.6* 4.9*  ALBUMIN 3.9 3.7 3.6 3.5 3.0*   Recent Labs  Lab 03/11/21 1545  LIPASE 31   No results for input(s): AMMONIA in the last 168 hours. Coagulation profile No results for input(s): INR, PROTIME in the last 168 hours.  CBC: Recent Labs  Lab 03/09/21 0623 03/11/21 1545 03/11/21 1830 03/13/21 0527  WBC 11.8* 14.0* 17.3* 9.4  NEUTROABS 10.3* 10.8*  --  8.5*  HGB 13.6 13.3 13.5 13.1  HCT 39.9 38.8 40.1 38.7  MCV 94.1 93.5 94.8 93.3  PLT 170 195 177 144*   Cardiac Enzymes: No results for input(s): CKTOTAL, CKMB, CKMBINDEX, TROPONINI in the last 168 hours. BNP: Invalid input(s): POCBNP CBG: No results for input(s): GLUCAP in the last 168 hours. D-Dimer No results for input(s): DDIMER in the last 72 hours. Hgb A1c No results for input(s): HGBA1C in the last 72 hours. Lipid Profile No results for input(s): CHOL, HDL, LDLCALC, TRIG, CHOLHDL, LDLDIRECT in the last 72 hours. Thyroid function studies Recent Labs    03/13/21 0527  TSH 0.052*   Anemia work up No results for input(s): VITAMINB12, FOLATE, FERRITIN, TIBC, IRON, RETICCTPCT in the last 72 hours. Microbiology Recent Results (from the past 240 hour(s))  Resp Panel by RT-PCR (Flu A&B, Covid) Nasopharyngeal Swab     Status: Abnormal   Collection Time: 03/06/21 12:32 PM   Specimen: Nasopharyngeal Swab; Nasopharyngeal(NP) swabs in vial transport medium  Result Value Ref Range Status   SARS Coronavirus 2 by RT PCR POSITIVE (A) NEGATIVE Final    Comment: CRITICAL RESULT CALLED TO, READ BACK BY AND VERIFIED WITH: SONJIA WEAVER [id number] 03/06/2021 DLB (NOTE) SARS-CoV-2 target nucleic  acids are DETECTED.  The SARS-CoV-2 RNA is generally detectable in upper respiratory specimens during the acute phase of infection. Positive results are indicative of the presence of the identified virus, but do not rule out bacterial infection or co-infection with other pathogens not detected by the test. Clinical correlation with patient history and other diagnostic information is necessary to determine patient infection status. The expected result is  Negative.  Fact Sheet for Patients: EntrepreneurPulse.com.au  Fact Sheet for Healthcare Providers: IncredibleEmployment.be  This test is not yet approved or cleared by the [place] FDA and  has been authorized for detection and/or diagnosis of SARS-CoV-2 by FDA under an Emergency Use Authorization (EUA).  This EUA will remain in effect (meaning this test  can be used) for the duration of  the COVID-19 declaration under Section 564(b)(1) of the Act, 21 U.S.C. section 360bbb-3(b)(1), unless the authorization is terminated or revoked sooner.     Influenza A by PCR NEGATIVE NEGATIVE Final   Influenza B by PCR NEGATIVE NEGATIVE Final    Comment: (NOTE) The Xpert Xpress SARS-CoV-2/FLU/RSV plus assay is intended as an aid in the diagnosis of influenza from Nasopharyngeal swab specimens and should not be used as a sole basis for treatment. Nasal washings and aspirates are unacceptable for Xpert Xpress SARS-CoV-2/FLU/RSV testing.  Fact Sheet for Patients: EntrepreneurPulse.com.au  Fact Sheet for Healthcare Providers: IncredibleEmployment.be  This test is not yet approved or cleared by the [place] FDA and has been authorized for detection and/or diagnosis of SARS-CoV-2 by FDA under an Emergency Use Authorization (EUA). This EUA will remain in effect (meaning this test can be used) for the duration of the COVID-19 declaration under Section 564(b)(1) of  the Act, 21 U.S.C. section 360bbb-3(b)(1), unless the authorization is terminated or revoked.  Performed at [facility], [address] [place], [place] [address]   Blood culture (routine x 2)     Status: None   Collection Time: 03/06/21 12:32 PM   Specimen: BLOOD LEFT HAND  Result Value Ref Range Status   Specimen Description BLOOD LEFT HAND  Final   Special Requests   Final    BOTTLES DRAWN AEROBIC AND ANAEROBIC Blood Culture results may not be optimal due to an inadequate volume of blood received in culture bottles   Culture   Final    NO GROWTH 5 DAYS Performed at [facility], [address] [place], [place] [address]    Report Status 03/11/2021 FINAL  Final  Blood culture (routine x 2)     Status: None   Collection Time: 03/06/21 12:32 PM   Specimen: BLOOD  Result Value Ref Range Status   Specimen Description BLOOD RIGHT ANTECUBITAL  Final   Special Requests   Final    BOTTLES DRAWN AEROBIC AND ANAEROBIC Blood Culture results may not be optimal due to an inadequate volume of blood received in culture bottles   Culture   Final    NO GROWTH 5 DAYS Performed at [facility], [address], [place],  [address]    Report Status 03/11/2021 FINAL  Final     Discharge Instructions:   Discharge Instructions     Diet - low sodium heart healthy   Complete by: As directed    Discharge wound care:   Complete by: As directed    Avoid laying on your back for too long   Increase activity slowly   Complete by: As directed       Allergies as of 03/13/2021       Reactions   Latex Itching   Levofloxacin    Mouth sores   Lisinopril    Cough?   Povidone-iodine Other (See Comments)   Severe blistering and itchiness. Severe redness   Simvastatin    Myalgias   Codeine Nausea And Vomiting        Medication List     TAKE these  medications    allopurinol 300 MG tablet Commonly known as: ZYLOPRIM Take 1 tablet by mouth once  daily   amLODipine 10 MG tablet Commonly known as: NORVASC Take 1 tablet (10 mg total) by mouth daily.   aspirin 81 MG EC tablet Take 1 tablet (81 mg total) by mouth daily.   atorvastatin 40 MG tablet Commonly known as: LIPITOR Take 1 tablet (40 mg total) by mouth every evening.   carvedilol 12.5 MG tablet Commonly known as: COREG Take 1 tablet (12.5 mg total) by mouth 2 (two) times daily with a meal.   clopidogrel 75 MG tablet Commonly known as: PLAVIX Take 1 tablet (75 mg total) by mouth daily.   dextromethorphan-guaiFENesin 30-600 MG 12hr tablet Commonly known as: MUCINEX DM Take 1 tablet by mouth every 12 (twelve) hours as needed for up to 7 days for cough.   Entresto 49-51 MG Generic drug: sacubitril-valsartan Take 1 tablet by mouth 2 (two) times daily.   Ferrous Sulfate 27 MG Tabs Take 27 mg by mouth at bedtime.   hydrocortisone 2.5 % cream Apply 1 application topically 2 (two) times daily as needed.   isosorbide mononitrate 30 MG 24 hr tablet Commonly known as: IMDUR Take 1 tablet (30 mg total) by mouth daily.   levothyroxine 137 MCG tablet Commonly known as: SYNTHROID Take 1 tablet (137 mcg total) by mouth every evening.   Melatonin 10 MG Tabs Take 10 mg by mouth at bedtime.   mirtazapine 30 MG tablet Commonly known as: REMERON Take 1 tablet (30 mg total) by mouth at bedtime.   multivitamin capsule Take 1 capsule by mouth every evening.   pantoprazole 40 MG tablet Commonly known as: PROTONIX Take 1 tablet (40 mg total) by mouth daily.   sertraline 25 MG tablet Commonly known as: ZOLOFT Take 1 tablet (25 mg total) by mouth daily.   torsemide 20 MG tablet Commonly known as: Demadex Take 1 tablet (20 mg total) by mouth daily.   zolpidem 6.25 MG CR tablet Commonly known as: Ambien CR Take 1 tablet (6.25 mg total) by mouth at bedtime as needed for sleep.               Discharge Care Instructions  (From admission, onward)            Start     Ordered   03/13/21 0000  Discharge wound care:       Comments: Avoid laying on your back for too long   03/13/21 1233              Time coordinating discharge: 32 minutes  Signed:  Lariza Cothron  [facility] 03/13/2021, 4:47 PM   Pager on [email or web address]. If 7PM-7AM, please contact night-coverage at [email or web address]

## 2021-03-13 NOTE — TOC Initial Note (Addendum)
Transition of Care [facility]) - Initial/Assessment Note    Patient Details  Name: Stephanie Charles MRN: [id number] Date of Birth: 08/03/1930  Transition of [facility]) CM/SW Contact:    Magnus Ivan, LCSW Phone Number: 03/13/2021, 1:15 PM  Clinical Narrative:       Patient to discharge home today. Spoke to patient via phone due to isolation. Patient lives with her daughter. Typically drives herself. PCP is Dr. Rosanna Randy. Patient is agreeable to Home Health. Per notes, referral made earlier this week to [facility]. Called [facility] [name]. She reported she will check with their office to confirm they can still take patient and will call CSW back asap.  1:30- [name] from [facility] confirmed they can still accept patient for RN, PT, OT, and Aide. No new orders needed. They are still on a delay of 7 days which she said was communicated with initial referral, they will call patient when discharged to schedule first visit.    Expected Discharge Plan: [place] Barriers to Discharge: Barriers Resolved   Patient Goals and CMS Choice Patient states their goals for this hospitalization and ongoing recovery are:: home with home health CMS Medicare.gov Compare Post Acute Care list provided to:: Patient Choice offered to / list presented to : Patient  Expected Discharge Plan and Services Expected Discharge Plan: [place]       Living arrangements for the past 2 months: Single Family Home Expected Discharge Date: 03/13/21                         HH Arranged: PT, OT, RN, Nurse's Aide          Prior Living Arrangements/Services Living arrangements for the past 2 months: Single Family Home Lives with:: Adult Children Patient language and need for interpreter reviewed:: Yes Do you feel safe going back to the place where you live?: Yes      Need for Family Participation in Patient Care: Yes (Comment) Care giver support system in place?: Yes  (comment)   Criminal Activity/Legal Involvement Pertinent to Current Situation/Hospitalization: No - Comment as needed  Activities of Daily Living Home Assistive Devices/Equipment: None ADL Screening (condition at time of admission) Patient's cognitive ability adequate to safely complete daily activities?: Yes Is the patient deaf or have difficulty hearing?: No Does the patient have difficulty seeing, even when wearing glasses/contacts?: No Does the patient have difficulty concentrating, remembering, or making decisions?: No Patient able to express need for assistance with ADLs?: Yes Does the patient have difficulty dressing or bathing?: No Independently performs ADLs?: Yes (appropriate for developmental age) Does the patient have difficulty walking or climbing stairs?: No Weakness of Legs: Both Weakness of Arms/Hands: None  Permission Sought/Granted Permission sought to share information with : Chartered certified accountant granted to share information with : Yes, Verbal Permission Granted     Permission granted to share info w AGENCY: Home Health agencies        Emotional Assessment       Orientation: : Oriented to Self, Oriented to Place, Oriented to  Time, Oriented to Situation Alcohol / Substance Use: Not Applicable Psych Involvement: No (comment)  Admission diagnosis:  Vomiting and diarrhea [R11.10, R19.7] Generalized weakness [R53.1] AKI (acute kidney injury) ([place]) [N17.9] Syncope, unspecified syncope type [R55] COVID-19 [U07.1] Patient Active Problem List   Diagnosis Date Noted   Vomiting and diarrhea 03/11/2021   Pressure injury of skin 03/11/2021  Acute pulmonary edema (HCC)    Shortness of breath    Acute on chronic combined systolic and diastolic CHF (congestive heart failure) ([place]) 03/06/2021   Acute on chronic combined systolic and diastolic congestive heart failure ([place]) 03/06/2021   COVID-19 virus infection 03/06/2021   Hypothyroidism     TIA (transient ischemic attack)    CAD (coronary artery disease)    CKD (chronic kidney disease), stage IIIa    Elevated troponin    Thrombocytopenia (HCC)    Unstable angina ([place]) 06/07/2019   Non-ST elevation (NSTEMI) myocardial infarction [facility])    Chest pain 06/06/2019   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   Diverticulitis 04/13/2018   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 11/12/2017   Ankle edema, bilateral 05/04/2017   Abnormal blood sugar 03/11/2015   Allergic rhinitis 03/11/2015   Absolute anemia 03/11/2015   Arthropathia 03/11/2015   Benign essential HTN 03/11/2015   Benign essential tremor 03/11/2015   Artery disease, cerebral 03/11/2015   Chronic venous insufficiency 03/11/2015   Clinical depression 03/11/2015   Urinary system disease 03/11/2015   Acid reflux 03/11/2015   HLD (hyperlipidemia) 03/11/2015   Adult hypothyroidism 03/11/2015   Irritable colon 03/11/2015   Cannot sleep 03/11/2015   Weak pulse 03/11/2015   Carotid stenosis 03/11/2015   Temporary cerebral vascular dysfunction 03/11/2015   External carotid artery stenosis 07/12/2014   PCP:  Jerrol Banana., MD Pharmacy:   [facility] [address] [place] - [address] [place] ([place]) [place] [address] Phone: [phone] Fax: [phone]  [facility] [place], [place] - [address] [place] [address] Phone: [phone] Fax: [phone]     Social Determinants of Health (SDOH) Interventions    Readmission Risk Interventions No flowsheet data found.

## 2021-03-13 NOTE — Telephone Encounter (Signed)
Please advise refill? 

## 2021-03-14 NOTE — Discharge Summary (Signed)
[facility] NAME: Stephanie Charles    MR#:  [id number]  DATE OF BIRTH:  1930-02-29  DATE OF ADMISSION:  03/06/2021   ADMITTING PHYSICIAN: Ivor Costa, MD  DATE OF DISCHARGE: 03/10/2021  1:04 PM  PRIMARY CARE PHYSICIAN: Jerrol Banana., MD   ADMISSION DIAGNOSIS:  Shortness of breath [R06.02] Acute pulmonary edema (HCC) [J81.0] CHF exacerbation (HCC) [I50.9] Acute on chronic combined systolic and diastolic congestive heart failure (HCC) [I50.43] DISCHARGE DIAGNOSIS:  Principal Problem:   Acute on chronic combined systolic and diastolic CHF (congestive heart failure) (HCC) Active Problems:   Benign essential HTN   Acid reflux   HLD (hyperlipidemia)   Hypothyroidism   TIA (transient ischemic attack)   CAD (coronary artery disease)   CKD (chronic kidney disease), stage IIIa   Elevated troponin   COVID-19 virus infection   Acute pulmonary edema (HCC)   Shortness of breath  SECONDARY DIAGNOSIS:   Past Medical History:  Diagnosis Date   Anemia    Diverticulitis    GERD (gastroesophageal reflux disease)    Hyperlipidemia    Hypertension    Hypothyroidism    Myocardial infarction ([place]) 08/2017   TIA (transient ischemic attack)    1 approx 2015, 1 approx 2018   Vertigo    last episode several months ago   [name]    HOSPITAL COURSE:  87 year old female with past medical history of hypertension, TIA, mild systolic CHF with ejection fraction of 40 to 45%, stage III chronic kidney disease and hypothyroidism admitted for shortness of breath and found to have COVID infection, markedly elevated blood pressures in the 170s to 180s and chest x-ray noting diffuse interstitial edema.  BNP elevated at 960.  Admitted to the hospitalist service and started on Remdisivir, steroids and Lasix.    Acute on chronic diastolic CHF (congestive heart failure) ([place]): Previous echocardiogram noted ejection fraction of 40-45% in December  2020.  Echo 50-55% on this admission. Diuresed with Lasix Net IO Since Admission: -4,147.65 mL [03/14/21 1621]   Malignant hypertension: Likely in the setting of volume overload plus COVID infection. Resolved now.     Acid reflux: PPI.     HLD (hyperlipidemia): Continue Lipitor.     Hypothyroidism: Continue Synthroid.    h/o TIA (transient ischemic attack): Continue aspirin and Plavix.     CAD (coronary artery disease)  AKI in the setting of CKD (chronic kidney disease), stage IIIa: Secondary to Lasix use. Back to baseline at DC     Elevated troponin: Mildly elevated due to demand ischemia, more likely due to CHF rather than ACS.       Thrombocytopenia ([place]): Mild thrombocytopenia in the setting of COVID infection     COVID-19 virus infection: Fortunately oxygen saturation staying above 90%.  On Solu-Medrol and Remdisivir with good response.   Pressure ulcer POA as below Pressure Injury 03/07/21 Sacrum Right;Left Stage 1 -  Intact skin with non-blanchable redness of a localized area usually over a bony prominence. (Active)  03/11/21 1724  Location: Sacrum  Location Orientation: Right;Left  Staging: Stage 1 -  Intact skin with non-blanchable redness of a localized area usually over a bony prominence.  Wound Description (Comments):   Present on Admission: Yes   DISCHARGE CONDITIONS:  fair CONSULTS OBTAINED:   DRUG ALLERGIES:   Allergies  Allergen Reactions   Latex Itching   Levofloxacin     Mouth sores   Lisinopril  Cough?   Povidone-Iodine Other (See Comments)    Severe blistering and itchiness. Severe redness   Simvastatin     Myalgias   Codeine Nausea And Vomiting   DISCHARGE MEDICATIONS:   Allergies as of 03/10/2021       Reactions   Latex Itching   Levofloxacin    Mouth sores   Lisinopril    Cough?   Povidone-iodine Other (See Comments)   Severe blistering and itchiness. Severe redness   Simvastatin    Myalgias   Codeine Nausea And Vomiting         Medication List     STOP taking these medications    albuterol (2.5 MG/3ML) 0.083% nebulizer solution Commonly known as: PROVENTIL       TAKE these medications    allopurinol 300 MG tablet Commonly known as: ZYLOPRIM Take 1 tablet by mouth once daily   amLODipine 10 MG tablet Commonly known as: NORVASC Take 1 tablet (10 mg total) by mouth daily.   aspirin 81 MG EC tablet Take 1 tablet (81 mg total) by mouth daily.   dextromethorphan-guaiFENesin 30-600 MG 12hr tablet Commonly known as: MUCINEX DM Take 1 tablet by mouth every 12 (twelve) hours as needed for up to 7 days for cough.   Entresto 49-51 MG Generic drug: sacubitril-valsartan Take 1 tablet by mouth 2 (two) times daily.   Ferrous Sulfate 27 MG Tabs Take 27 mg by mouth at bedtime.   isosorbide mononitrate 30 MG 24 hr tablet Commonly known as: IMDUR Take 1 tablet (30 mg total) by mouth daily.   Melatonin 10 MG Tabs Take 10 mg by mouth at bedtime.   mirtazapine 30 MG tablet Commonly known as: REMERON Take 1 tablet (30 mg total) by mouth at bedtime.   multivitamin capsule Take 1 capsule by mouth every evening.   pantoprazole 40 MG tablet Commonly known as: PROTONIX Take 1 tablet (40 mg total) by mouth daily.   sertraline 25 MG tablet Commonly known as: ZOLOFT Take 1 tablet (25 mg total) by mouth daily.   torsemide 20 MG tablet Commonly known as: Demadex Take 1 tablet (20 mg total) by mouth daily.   zolpidem 6.25 MG CR tablet Commonly known as: Ambien CR Take 1 tablet (6.25 mg total) by mouth at bedtime as needed for sleep.       DISCHARGE INSTRUCTIONS:   DIET:  Regular diet DISCHARGE CONDITION:  Fair ACTIVITY:  Activity as tolerated OXYGEN:  Home Oxygen: No.  Oxygen Delivery: room air DISCHARGE LOCATION:  Home with Home Health - Palliative care to follow  Very high risk for readmission  If you experience worsening of your admission symptoms, develop shortness of breath, life  threatening emergency, suicidal or homicidal thoughts you must seek medical attention immediately by calling 911 or calling your MD immediately  if symptoms less severe.  You Must read complete instructions/literature along with all the possible adverse reactions/side effects for all the Medicines you take and that have been prescribed to you. Take any new Medicines after you have completely understood and accpet all the possible adverse reactions/side effects.   Please note  You were cared for by a hospitalist during your hospital stay. If you have any questions about your discharge medications or the care you received while you were in the hospital after you are discharged, you can call the unit and asked to speak with the hospitalist on call if the hospitalist that took care of you is not available. Once you are discharged,  your primary care physician will handle any further medical issues. Please note that NO REFILLS for any discharge medications will be authorized once you are discharged, as it is imperative that you return to your primary care physician (or establish a relationship with a primary care physician if you do not have one) for your aftercare needs so that they can reassess your need for medications and monitor your lab values.    On the day of Discharge:  VITAL SIGNS:  Blood pressure (!) 162/74, pulse 60, temperature (!) 97.3 F (36.3 C), temperature source Oral, resp. rate 18, height 5\' 3"  (1.6 m), weight 70.6 kg, SpO2 94 %. PHYSICAL EXAMINATION:  GENERAL:  87 y.o.-year-old patient lying in the bed with no acute distress.  EYES: Pupils equal, round, reactive to light and accommodation. No scleral icterus. Extraocular muscles intact.  HEENT: Head atraumatic, normocephalic. Oropharynx and nasopharynx clear.  NECK:  Supple, no jugular venous distention. No thyroid enlargement, no tenderness.  LUNGS: Normal breath sounds bilaterally, no wheezing, rales,rhonchi or crepitation. No use  of accessory muscles of respiration.  CARDIOVASCULAR: S1, S2 normal. No murmurs, rubs, or gallops.  ABDOMEN: Soft, non-tender, non-distended. Bowel sounds present. No organomegaly or mass.  EXTREMITIES: No pedal edema, cyanosis, or clubbing.  NEUROLOGIC: Cranial nerves II through XII are intact. Muscle strength 5/5 in all extremities. Sensation intact. Gait not checked.  PSYCHIATRIC: The patient is alert and oriented x 3.  SKIN: No obvious rash, lesion, or ulcer.  DATA REVIEW:   CBC Recent Labs  Lab 03/13/21 0527  WBC 9.4  HGB 13.1  HCT 38.7  PLT 144*    Chemistries  Recent Labs  Lab 03/12/21 0436 03/13/21 0527  NA 141 143  K 4.0 4.2  CL 110 112*  CO2 24 24  GLUCOSE 130* 181*  BUN 95* 68*  CREATININE 1.73* 1.52*  CALCIUM 7.8* 8.4*  MG 2.0  --   AST 35  --   ALT 13  --   ALKPHOS 55  --   BILITOT 1.3*  --      Outpatient follow-up  Follow-up Information     Jerrol Banana., MD. Schedule an appointment as soon as possible for a visit on 04/13/2021.   Specialty: Family Medicine Why: @ 9:40am Contact information: [address] [place] [address] [phone]         [name], MD. Schedule an appointment as soon as possible for a visit on 03/24/2021.   Specialty: Cardiology Why: @ 10am Contact information: [place] [address] [phone]         Call to follow up.                  30 Day Unplanned Readmission Risk Score    Flowsheet Row ED to Hosp-Admission (Discharged) from 03/06/2021 in [facility]  30 Day Unplanned Readmission Risk Score (%) 17.75 Filed at 03/10/2021 1200       This score is the patient's risk of an unplanned readmission within 30 days of being discharged (0 -100%). The score is based on dignosis, age, lab data, medications, orders, and past utilization.   Low:  0-14.9   Medium: 15-21.9   High: 22-29.9   Extreme: 30 and above            Management plans discussed with the patient, family and they are in agreement.  CODE STATUS: Prior   TOTAL TIME TAKING CARE OF  THIS PATIENT: 45 minutes.    Max Sane M.D on 03/14/2021 at 4:17 PM  [facility]   CC: Primary care physician; Jerrol Banana., MD   Note: This dictation was prepared with Dragon dictation along with smaller phrase technology. Any transcriptional errors that result from this process are unintentional.

## 2021-03-15 ENCOUNTER — Other Ambulatory Visit: Payer: Self-pay | Admitting: Family Medicine

## 2021-03-15 DIAGNOSIS — Z8673 Personal history of transient ischemic attack (TIA), and cerebral infarction without residual deficits: Secondary | ICD-10-CM

## 2021-03-15 NOTE — Telephone Encounter (Signed)
Requested Prescriptions  Pending Prescriptions Disp Refills  . clopidogrel (PLAVIX) 75 MG tablet [Pharmacy Med Name: Clopidogrel Bisulfate 75 MG Oral Tablet] 90 tablet     Sig: Take 1 tablet by mouth once daily     Hematology: Antiplatelets - clopidogrel Failed - 03/15/2021  2:38 PM      Failed - Evaluate AST, ALT within 2 months of therapy initiation.      Failed - PLT in normal range and within 180 days    Platelets  Date Value Ref Range Status  03/13/2021 144 (L) 150 - 400 K/uL Final  08/21/2020 146 (L) 150 - 450 x10E3/uL Final         Failed - Valid encounter within last 6 months    Recent Outpatient Visits          6 months ago Benign essential HTN   [facility] Jerrol Banana., MD   10 months ago Benign essential HTN   [facility] Jerrol Banana., MD   1 year ago LLQ abdominal pain   [facility] Carles Collet M, [place]   1 year ago Non-ST elevation (NSTEMI) myocardial infarction [facility])   [facility] Jerrol Banana., MD   1 year ago Iron deficiency anemia due to chronic blood loss   [facility] Jerrol Banana., MD      Future Appointments            In 1 week Gollan, Kathlene November, MD [facility], [facility]   In 4 weeks Jerrol Banana., MD [facility], [facility]   In 2 months Gollan, Kathlene November, MD [facility], [facility]           Passed - ALT in normal range and within 360 days    ALT  Date Value Ref Range Status  03/12/2021 13 0 - 44 U/L Final    Comment:    HEMOLYSIS AT THIS LEVEL MAY AFFECT RESULT   SGPT (ALT)  Date Value Ref Range Status  04/26/2014 29 U/L Final    Comment:    14-63 NOTE: New Reference Range 04/23/14          Passed - AST in normal range and within 360 days    AST  Date Value Ref Range Status  03/12/2021 35 15 - 41 U/L Final    Comment:    HEMOLYSIS AT THIS LEVEL MAY AFFECT RESULT    SGOT(AST)  Date Value Ref Range Status  04/26/2014 22 15 - 37 Unit/L Final         Passed - HCT in normal range and within 180 days    HCT  Date Value Ref Range Status  03/13/2021 38.7 36.0 - 46.0 % Final   Hematocrit  Date Value Ref Range Status  08/21/2020 34.8 34.0 - 46.6 % Final         Passed - HGB in normal range and within 180 days    Hemoglobin  Date Value Ref Range Status  03/13/2021 13.1 12.0 - 15.0 g/dL Final  08/21/2020 11.6 11.1 - 15.9 g/dL Final         . amitriptyline (ELAVIL) 10 MG tablet [Pharmacy Med Name: Amitriptyline HCl 10 MG Oral Tablet] 30 tablet     Sig: TAKE 1 TABLET BY MOUTH AT BEDTIME     Psychiatry:  Antidepressants - Heterocyclics (TCAs) Failed - 03/15/2021  2:38 PM      Failed - Valid encounter  within last 6 months    Recent Outpatient Visits          6 months ago Benign essential HTN   [facility] Jerrol Banana., MD   10 months ago Benign essential HTN   [facility] Jerrol Banana., MD   1 year ago LLQ abdominal pain   [place], [place], [place]   1 year ago Non-ST elevation (NSTEMI) myocardial infarction [facility]   [facility] Jerrol Banana., MD   1 year ago Iron deficiency anemia due to chronic blood loss   [facility] Jerrol Banana., MD      Future Appointments            In 1 week Gollan, Kathlene November, MD [facility], [place]   In 4 weeks Jerrol Banana., MD [facility], [facility]   In 2 months Gollan, Kathlene November, MD [facility], [facility]           Passed - Completed PHQ-2 or PHQ-9 in the last 360 days

## 2021-03-15 NOTE — Telephone Encounter (Signed)
Requested medication (s) are due for refill today: yes  Requested medication (s) are on the active medication list: yes  Last refill:  filled 09/08/20   and ended 03/13/21 when admitted to hospital on 03/13/21 Future visit scheduled: yes  Notes to clinic:   At hospital discharge the med was not ordered -Class: No Print   Requested Prescriptions  Pending Prescriptions Disp Refills   clopidogrel (PLAVIX) 75 MG tablet [Pharmacy Med Name: Clopidogrel Bisulfate 75 MG Oral Tablet] 90 tablet     Sig: Take 1 tablet by mouth once daily      Hematology: Antiplatelets - clopidogrel Failed - 03/15/2021  2:38 PM      Failed - Evaluate AST, ALT within 2 months of therapy initiation.      Failed - PLT in normal range and within 180 days    Platelets  Date Value Ref Range Status  03/13/2021 144 (L) 150 - 400 K/uL Final  08/21/2020 146 (L) 150 - 450 x10E3/uL Final          Failed - Valid encounter within last 6 months    Recent Outpatient Visits           6 months ago Benign essential HTN   [facility] Jerrol Banana., MD   10 months ago Benign essential HTN   [facility] Jerrol Banana., MD   1 year ago LLQ abdominal pain   [facility] Carles Collet M, [place]   1 year ago Non-ST elevation (NSTEMI) myocardial infarction [facility])   [facility] Jerrol Ban[facility]., MD   1 year ago Iron deficiency anemia due to chronic blood loss   [facility] Jerrol Banana., MD       Future Appointments             In 1 week Gollan, Kathlene November, MD [facility], [facility]   In 4 weeks Jerrol Banana., MD [facility], [facility]   In 2 months Gollan, Kathlene November, MD [facility], [facility]             Passed - ALT in normal range and within 360 days    ALT  Date Value Ref Range Status  03/12/2021 13 0 - 44 U/L Final    Comment:    HEMOLYSIS AT THIS LEVEL MAY  AFFECT RESULT   SGPT (ALT)  Date Value Ref Range Status  04/26/2014 29 U/L Final    Comment:    14-63 NOTE: New Reference Range 04/23/14           Passed - AST in normal range and within 360 days    AST  Date Value Ref Range Status  03/12/2021 35 15 - 41 U/L Final    Comment:    HEMOLYSIS AT THIS LEVEL MAY AFFECT RESULT   SGOT(AST)  Date Value Ref Range Status  04/26/2014 22 15 - 37 Unit/L Final          Passed - HCT in normal range and within 180 days    HCT  Date Value Ref Range Status  03/13/2021 38.7 36.0 - 46.0 % Final   Hematocrit  Date Value Ref Range Status  08/21/2020 34.8 34.0 - 46.6 % Final          Passed - HGB in normal range and within 180 days    Hemoglobin  Date Value Ref Range Status  03/13/2021 13.1 12.0 - 15.0 g/dL Final  08/21/2020 11.6 11.1 - 15.9 g/dL Final           Refused Prescriptions Disp Refills   amitriptyline (ELAVIL) 10 MG tablet [Pharmacy Med Name: Amitriptyline HCl 10 MG Oral Tablet] 30 tablet     Sig: TAKE 1 TABLET BY MOUTH AT BEDTIME      Psychiatry:  Antidepressants - Heterocyclics (TCAs) Failed - 03/15/2021  2:38 PM      Failed - Valid encounter within last 6 months    Recent Outpatient Visits           6 months ago Benign essential HTN   [facility] Jerrol Banana., MD   10 months ago Benign essential HTN   [facility] Jerrol Banana., MD   1 year ago LLQ abdominal pain   [place], [place], [place]   1 year ago Non-ST elevation (NSTEMI) myocardial infarction [facility])   [facility] Jerrol Banana., MD   1 year ago Iron deficiency anemia due to chronic blood loss   [facility] Jerrol Banana., MD       Future Appointments             In 1 week Gollan, Kathlene November, MD [facility], [place]   In 4 weeks Jerrol Banana., MD [facility], [facility]   In 2 months Gollan,  Kathlene November, MD [facility], [facility]             Passed - Completed PHQ-2 or PHQ-9 in the last 360 days

## 2021-03-16 NOTE — Progress Notes (Deleted)
   Patient ID: Stephanie Charles, female    DOB: Mar 27, 1930, 87 y.o.   MRN: [id number]  HPI  Stephanie Charles is a 87 y/o female with a history of  Echo report from 03/07/21 reviewed and showed an EF of 50-55% along with moderate LVH/ LAE.   RHC/LHC done 06/07/2019 which showed: Prox LAD lesion is 90% stenosed. Prox Cx to Mid Cx lesion is 40% stenosed. Prox RCA lesion is 30% stenosed. With subsequent angioplasty and stent of LAD.  Admitted 03/11/21 due to   Review of Systems    Physical Exam    Assessment & Plan:  1: Chronic heart failure with preserved ejection fraction with structural changes (LVH/ LAE): - NYHA class

## 2021-03-17 ENCOUNTER — Ambulatory Visit: Payer: Medicare Other | Admitting: Family

## 2021-03-18 ENCOUNTER — Telehealth: Payer: Self-pay | Admitting: Primary Care

## 2021-03-18 NOTE — Telephone Encounter (Signed)
Spoke with patient's daughter, Neoma Laming, regarding the Palliative referral/services.  All questions were answered and daughter wanted to discuss this with the patient to make sure that she was okay with starting Palliative services.  Daughter took my name and number and will call me back to let me know what patient has decided to do.

## 2021-03-20 DIAGNOSIS — F32A Depression, unspecified: Secondary | ICD-10-CM | POA: Diagnosis not present

## 2021-03-20 DIAGNOSIS — E785 Hyperlipidemia, unspecified: Secondary | ICD-10-CM | POA: Diagnosis not present

## 2021-03-20 DIAGNOSIS — J9601 Acute respiratory failure with hypoxia: Secondary | ICD-10-CM | POA: Diagnosis not present

## 2021-03-20 DIAGNOSIS — N1832 Chronic kidney disease, stage 3b: Secondary | ICD-10-CM | POA: Diagnosis not present

## 2021-03-20 DIAGNOSIS — I130 Hypertensive heart and chronic kidney disease with heart failure and stage 1 through stage 4 chronic kidney disease, or unspecified chronic kidney disease: Secondary | ICD-10-CM | POA: Diagnosis not present

## 2021-03-20 DIAGNOSIS — N179 Acute kidney failure, unspecified: Secondary | ICD-10-CM | POA: Diagnosis not present

## 2021-03-20 DIAGNOSIS — D631 Anemia in chronic kidney disease: Secondary | ICD-10-CM | POA: Diagnosis not present

## 2021-03-20 DIAGNOSIS — I959 Hypotension, unspecified: Secondary | ICD-10-CM | POA: Diagnosis not present

## 2021-03-20 DIAGNOSIS — Z96653 Presence of artificial knee joint, bilateral: Secondary | ICD-10-CM | POA: Diagnosis not present

## 2021-03-20 DIAGNOSIS — I872 Venous insufficiency (chronic) (peripheral): Secondary | ICD-10-CM | POA: Diagnosis not present

## 2021-03-20 DIAGNOSIS — K5520 Angiodysplasia of colon without hemorrhage: Secondary | ICD-10-CM | POA: Diagnosis not present

## 2021-03-20 DIAGNOSIS — I2510 Atherosclerotic heart disease of native coronary artery without angina pectoris: Secondary | ICD-10-CM | POA: Diagnosis not present

## 2021-03-20 DIAGNOSIS — I5043 Acute on chronic combined systolic (congestive) and diastolic (congestive) heart failure: Secondary | ICD-10-CM | POA: Diagnosis not present

## 2021-03-20 DIAGNOSIS — J1282 Pneumonia due to coronavirus disease 2019: Secondary | ICD-10-CM | POA: Diagnosis not present

## 2021-03-20 DIAGNOSIS — R7309 Other abnormal glucose: Secondary | ICD-10-CM | POA: Diagnosis not present

## 2021-03-20 DIAGNOSIS — E039 Hypothyroidism, unspecified: Secondary | ICD-10-CM | POA: Diagnosis not present

## 2021-03-20 DIAGNOSIS — M109 Gout, unspecified: Secondary | ICD-10-CM | POA: Diagnosis not present

## 2021-03-20 DIAGNOSIS — U071 COVID-19: Secondary | ICD-10-CM | POA: Diagnosis not present

## 2021-03-20 DIAGNOSIS — J309 Allergic rhinitis, unspecified: Secondary | ICD-10-CM | POA: Diagnosis not present

## 2021-03-20 DIAGNOSIS — M129 Arthropathy, unspecified: Secondary | ICD-10-CM | POA: Diagnosis not present

## 2021-03-20 DIAGNOSIS — I252 Old myocardial infarction: Secondary | ICD-10-CM | POA: Diagnosis not present

## 2021-03-20 DIAGNOSIS — Z7982 Long term (current) use of aspirin: Secondary | ICD-10-CM | POA: Diagnosis not present

## 2021-03-20 DIAGNOSIS — Z7902 Long term (current) use of antithrombotics/antiplatelets: Secondary | ICD-10-CM | POA: Diagnosis not present

## 2021-03-20 DIAGNOSIS — K219 Gastro-esophageal reflux disease without esophagitis: Secondary | ICD-10-CM | POA: Diagnosis not present

## 2021-03-23 ENCOUNTER — Telehealth: Payer: Self-pay | Admitting: Primary Care

## 2021-03-23 NOTE — Telephone Encounter (Signed)
Left message with daughter, Neoma Laming, asking if she spoke with the patient about the Palliative referral and to see if patient was in agreement with [name] coming out to see her.  Left my name and call back number.

## 2021-03-24 ENCOUNTER — Ambulatory Visit (INDEPENDENT_AMBULATORY_CARE_PROVIDER_SITE_OTHER): Payer: Medicare Other | Admitting: Cardiovascular Disease

## 2021-03-24 ENCOUNTER — Other Ambulatory Visit: Payer: Self-pay

## 2021-03-24 ENCOUNTER — Encounter: Payer: Self-pay | Admitting: Cardiovascular Disease

## 2021-03-24 VITALS — BP 122/52 | HR 73 | Ht 62.0 in | Wt 161.2 lb

## 2021-03-24 DIAGNOSIS — N179 Acute kidney failure, unspecified: Secondary | ICD-10-CM | POA: Diagnosis not present

## 2021-03-24 DIAGNOSIS — E785 Hyperlipidemia, unspecified: Secondary | ICD-10-CM | POA: Diagnosis not present

## 2021-03-24 DIAGNOSIS — D631 Anemia in chronic kidney disease: Secondary | ICD-10-CM | POA: Diagnosis not present

## 2021-03-24 DIAGNOSIS — F32A Depression, unspecified: Secondary | ICD-10-CM | POA: Diagnosis not present

## 2021-03-24 DIAGNOSIS — Z79899 Other long term (current) drug therapy: Secondary | ICD-10-CM

## 2021-03-24 DIAGNOSIS — M109 Gout, unspecified: Secondary | ICD-10-CM | POA: Diagnosis not present

## 2021-03-24 DIAGNOSIS — Z7902 Long term (current) use of antithrombotics/antiplatelets: Secondary | ICD-10-CM | POA: Diagnosis not present

## 2021-03-24 DIAGNOSIS — I25118 Atherosclerotic heart disease of native coronary artery with other forms of angina pectoris: Secondary | ICD-10-CM

## 2021-03-24 DIAGNOSIS — E039 Hypothyroidism, unspecified: Secondary | ICD-10-CM | POA: Diagnosis not present

## 2021-03-24 DIAGNOSIS — J309 Allergic rhinitis, unspecified: Secondary | ICD-10-CM | POA: Diagnosis not present

## 2021-03-24 DIAGNOSIS — I255 Ischemic cardiomyopathy: Secondary | ICD-10-CM

## 2021-03-24 DIAGNOSIS — I872 Venous insufficiency (chronic) (peripheral): Secondary | ICD-10-CM | POA: Diagnosis not present

## 2021-03-24 DIAGNOSIS — M129 Arthropathy, unspecified: Secondary | ICD-10-CM | POA: Diagnosis not present

## 2021-03-24 DIAGNOSIS — I959 Hypotension, unspecified: Secondary | ICD-10-CM | POA: Diagnosis not present

## 2021-03-24 DIAGNOSIS — I10 Essential (primary) hypertension: Secondary | ICD-10-CM | POA: Diagnosis not present

## 2021-03-24 DIAGNOSIS — I5042 Chronic combined systolic (congestive) and diastolic (congestive) heart failure: Secondary | ICD-10-CM | POA: Diagnosis not present

## 2021-03-24 DIAGNOSIS — N183 Chronic kidney disease, stage 3 unspecified: Secondary | ICD-10-CM

## 2021-03-24 DIAGNOSIS — I252 Old myocardial infarction: Secondary | ICD-10-CM | POA: Diagnosis not present

## 2021-03-24 DIAGNOSIS — K5520 Angiodysplasia of colon without hemorrhage: Secondary | ICD-10-CM | POA: Diagnosis not present

## 2021-03-24 DIAGNOSIS — J9601 Acute respiratory failure with hypoxia: Secondary | ICD-10-CM | POA: Diagnosis not present

## 2021-03-24 DIAGNOSIS — I5043 Acute on chronic combined systolic (congestive) and diastolic (congestive) heart failure: Secondary | ICD-10-CM | POA: Diagnosis not present

## 2021-03-24 DIAGNOSIS — I130 Hypertensive heart and chronic kidney disease with heart failure and stage 1 through stage 4 chronic kidney disease, or unspecified chronic kidney disease: Secondary | ICD-10-CM | POA: Diagnosis not present

## 2021-03-24 DIAGNOSIS — J1282 Pneumonia due to coronavirus disease 2019: Secondary | ICD-10-CM | POA: Diagnosis not present

## 2021-03-24 DIAGNOSIS — R7309 Other abnormal glucose: Secondary | ICD-10-CM | POA: Diagnosis not present

## 2021-03-24 DIAGNOSIS — N1832 Chronic kidney disease, stage 3b: Secondary | ICD-10-CM | POA: Diagnosis not present

## 2021-03-24 DIAGNOSIS — Z7982 Long term (current) use of aspirin: Secondary | ICD-10-CM | POA: Diagnosis not present

## 2021-03-24 DIAGNOSIS — U071 COVID-19: Secondary | ICD-10-CM | POA: Diagnosis not present

## 2021-03-24 DIAGNOSIS — I2510 Atherosclerotic heart disease of native coronary artery without angina pectoris: Secondary | ICD-10-CM | POA: Diagnosis not present

## 2021-03-24 DIAGNOSIS — Z96653 Presence of artificial knee joint, bilateral: Secondary | ICD-10-CM | POA: Diagnosis not present

## 2021-03-24 DIAGNOSIS — K219 Gastro-esophageal reflux disease without esophagitis: Secondary | ICD-10-CM | POA: Diagnosis not present

## 2021-03-24 NOTE — Progress Notes (Signed)
Cardiology Office Note  Date:  03/24/2021   ID:  Stephanie Charles, DOB 1930-05-26, MRN [id number]  PCP:  Jerrol Banana., MD   Chief Complaint  Patient presents with   [facility] follow up     Patient c/o LE edema, weakness & fatigue. Medications reviewed by the patient verbally.     HPI:  Stephanie Charles is a 87 y.o. female with history of  demand ischemia with hypertensive urgency,  LBBB,  TIA/CVA in 2016,  CKD stage II,  HTN,  HLD,  carotid artery disease,  hypothyroidism,  anxiety, depression,  Coronary disease, stent placed to proximal to mid LAD  ejection fraction 35 to 40%, repeat 09/2019: 40 to 45% Who presents for follow-up of her ischemic cardiomyopathy  Covid, in hospital,March 06 2021 BP elevated on arrival to the hospital Creatinine 0.9 on arrival,  Treated with diuretic for shortness of breath, concern for CHF Sent home 03/10/2021,d/c with CR 1.6, BUN 92, above her baseline  After discharge, had episode of syncope on toilet in the setting of diarrhea on 03/11/21 Presented to the hospital again with dehydration multiple episodes of profuse watery diarrhea bradycardic into the 40s, given 0.5 mg of atropine in the ER Creatinine 2.12, BUN 100 Given IV fluids  Echo 50 to 55% June 2022 Left ventricular ejection fraction, by estimation, is 50 to 55%.  Several medications changes made in hospital Amlodipine started, imdur stopped  Denies any orthostasis symptoms  EKG personally reviewed by myself on todays visit NSR with LBBB rate 73 bpm   EKG personally reviewed by myself on todays visit Shows normal sinus rhythm rate 73 bpm left branch block no significant ST-T wave changes  Other past medical history reviewed hospital September 2020, ejection fraction 35 to 40% She had symptoms of chest pain and accelerated HTN found to have elevated high sensitivity troponin peaking at 270 and acute systolic CHF.    cardiac catheterization, with severe proximal to mid LAD  disease, stent placed  Follow-up echocardiogram December 2020 ejection fraction 40 up to 45% Tolerating Entresto, carvedilol, HCTZ Reports blood pressure stable at home  PMH:   has a past medical history of Anemia, Diverticulitis, GERD (gastroesophageal reflux disease), Hyperlipidemia, Hypertension, Hypothyroidism, Myocardial infarction ([place]) (08/2017), TIA (transient ischemic attack), Vertigo, and [name].  PSH:    Past Surgical History:  Procedure Laterality Date   COLONOSCOPY WITH PROPOFOL N/A 06/22/2018   Procedure: COLONOSCOPY WITH PROPOFOL;  Surgeon: Jonathon Bellows, MD;  Location: [facility] ENDOSCOPY;  Service: Endoscopy;  Laterality: N/A;   CORONARY STENT INTERVENTION N/A 06/07/2019   Procedure: CORONARY STENT INTERVENTION;  Surgeon: [name], MD;  Location: [place] CV LAB;  Service: Cardiovascular;  Laterality: N/A;   ESOPHAGOGASTRODUODENOSCOPY (EGD) WITH PROPOFOL N/A 06/22/2018   Procedure: ESOPHAGOGASTRODUODENOSCOPY (EGD) WITH PROPOFOL;  Surgeon: Jonathon Bellows, MD;  Location: [facility] ENDOSCOPY;  Service: Endoscopy;  Laterality: N/A;   KNEE ARTHROSCOPY Right    REPLACEMENT TOTAL KNEE BILATERAL     RIGHT/LEFT HEART CATH AND CORONARY ANGIOGRAPHY N/A 06/07/2019   Procedure: RIGHT/LEFT HEART CATH AND CORONARY ANGIOGRAPHY;  Surgeon: [name], MD;  Location: [place] CV LAB;  Service: Cardiovascular;  Laterality: N/A;   SHOULDER SURGERY Left     Current Outpatient Medications  Medication Sig Dispense Refill   allopurinol (ZYLOPRIM) 300 MG tablet Take 1 tablet by mouth once daily 90 tablet 3   amitriptyline (ELAVIL) 10 MG tablet TAKE 1 TABLET BY MOUTH AT BEDTIME 30  tablet 0   amLODipine (NORVASC) 10 MG tablet Take 1 tablet (10 mg total) by mouth daily. 30 tablet 0   aspirin EC 81 MG EC tablet Take 1 tablet (81 mg total) by mouth daily.     atorvastatin (LIPITOR) 40 MG tablet Take 1 tablet (40 mg total) by mouth every evening.     carvedilol (COREG)  12.5 MG tablet Take 1 tablet (12.5 mg total) by mouth 2 (two) times daily with a meal.     clopidogrel (PLAVIX) 75 MG tablet Take 1 tablet by mouth once daily 90 tablet 3   Ferrous Sulfate 27 MG TABS Take 27 mg by mouth at bedtime.     hydrocortisone 2.5 % cream Apply 1 application topically 2 (two) times daily as needed.     isosorbide mononitrate (IMDUR) 30 MG 24 hr tablet Take 1 tablet (30 mg total) by mouth daily. 30 tablet 0   levothyroxine (SYNTHROID) 137 MCG tablet Take 1 tablet (137 mcg total) by mouth every evening.     Melatonin 10 MG TABS Take 10 mg by mouth at bedtime. 30 tablet 0   mirtazapine (REMERON) 30 MG tablet Take 1 tablet (30 mg total) by mouth at bedtime. 90 tablet 0   Multiple Vitamin (MULTIVITAMIN) capsule Take 1 capsule by mouth every evening.     pantoprazole (PROTONIX) 40 MG tablet Take 1 tablet (40 mg total) by mouth daily. 90 tablet 1   sacubitril-valsartan (ENTRESTO) 49-51 MG Take 1 tablet by mouth 2 (two) times daily. 180 tablet 2   sertraline (ZOLOFT) 25 MG tablet Take 1 tablet (25 mg total) by mouth daily. 90 tablet 0   torsemide (DEMADEX) 20 MG tablet Take 1 tablet (20 mg total) by mouth daily. 30 tablet 0   zolpidem (AMBIEN CR) 6.25 MG CR tablet Take 1 tablet (6.25 mg total) by mouth at bedtime as needed for sleep. (Patient not taking: Reported on 03/24/2021) 30 tablet 0   No current facility-administered medications for this visit.    Allergies:   Latex, Levofloxacin, Lisinopril, Povidone-iodine, Simvastatin, and Codeine   Social History:  The patient  reports that she has never smoked. She has never used smokeless tobacco. She reports that she does not drink alcohol and does not use drugs.   Family History:   family history includes Alzheimer's disease in her brother and brother; Breast cancer in her sister; Cancer in her brother; Clotting disorder in her daughter; Fibromyalgia in her daughter; Heart attack in her brother; Heart disease in her brother,  brother, father, and mother; Hypertension in her mother; Mental illness in her brother; Stroke in her brother and mother.    Review of Systems: Review of Systems  Constitutional: Negative.   HENT: Negative.    Respiratory: Negative.    Cardiovascular: Negative.   Gastrointestinal: Negative.   Musculoskeletal: Negative.   Neurological:  Positive for tremors.  Psychiatric/Behavioral: Negative.    All other systems reviewed and are negative.  PHYSICAL EXAM: VS:  BP (!) 122/52 (BP Location: Left Arm, Patient Position: Sitting, Cuff Size: Normal)   Pulse 73   Ht 5\' 2"  (1.575 m)   Wt 161 lb 4 oz (73.1 kg)   SpO2 97%   BMI 29.49 kg/m  , BMI Body mass index is 29.49 kg/m. Constitutional:  oriented to person, place, and time. No distress.  HENT:  Head: Grossly normal Eyes:  no discharge. No scleral icterus.  Neck: No JVD, no carotid bruits  Cardiovascular: Regular rate and rhythm, no  murmurs appreciated Pulmonary/Chest: Clear to auscultation bilaterally, no wheezes or rails Abdominal: Soft.  no distension.  no tenderness.  Musculoskeletal: Normal range of motion Neurological:  normal muscle tone. Coordination normal. No atrophy Skin: Skin warm and dry Psychiatric: normal affect, pleasant  Recent Labs: 03/06/2021: B Natriuretic Peptide 960.1 03/12/2021: ALT 13; Magnesium 2.0 03/13/2021: BUN 68; Creatinine, Ser 1.52; Hemoglobin 13.1; Platelets 144; Potassium 4.2; Sodium 143; TSH 0.052   Lipid Panel Lab Results  Component Value Date   CHOL 118 03/07/2021   HDL 33 (L) 03/07/2021   LDLCALC 68 03/07/2021   TRIG 83 03/07/2021      Wt Readings from Last 3 Encounters:  03/24/21 161 lb 4 oz (73.1 kg)  03/11/21 155 lb (70.3 kg)  03/10/21 155 lb 9.6 oz (70.6 kg)     ASSESSMENT AND PLAN:  Problem List Items Addressed This Visit       Cardiology Problems   Benign essential HTN   CAD (coronary artery disease) - Primary   Relevant Orders   EKG 12-Lead     Other   CKD (chronic  kidney disease), stage IIIa   Relevant Orders   Basic metabolic panel   Other Visit Diagnoses     Chronic combined systolic and diastolic heart failure ([place])       Relevant Orders   EKG 12-Lead   Ischemic cardiomyopathy       Hyperlipidemia LDL goal <70       Medication management       Relevant Orders   Basic metabolic panel     Ischemic cardiomyopathy Tolerating Entresto, carvedilol Was transitioned from HCTZ to torsemide 20 mg daily in the hospital Recommend periodic BMP Order placed today for baseline renal function given recent hospitalization  CAD with stable angina Stent placed to her proximal to mid LAD Ejection fraction 50 to 55%, no anginal symptoms, no further ischemic work-up at this time  Carotid bruit Mild disease bilaterally, less than 39%  Hyperlipidemia On Lipitor 40 daily Cholesterol is at goal on the current lipid regimen. No changes to the medications were made.  Acute on chronic renal failure BMP today Renal failure in the setting of dehydration /diarrhea in the hospital    Total encounter time more than 25 minutes  Greater than 50% was spent in counseling and coordination of care with the patient    Signed, Esmond Plants, M.D., Ph.D. [place], [place]

## 2021-03-24 NOTE — Patient Instructions (Addendum)
Medication Instructions:  Monitor blood pressure If top number less then 115 on a regular basis, Cut the amlodipine to "half pill" daily (5 mg)  Torsemide For weight 156 or less, skip the torsemide For weight 163 or greater, take an extra torsemide  If you need a refill on your cardiac medications before your next appointment, please call your pharmacy.   Lab work: BMET LABS WILL APPEAR ON MYCHART, ABNORMAL RESULTS WILL BE CALLED  Testing/Procedures: No new testing needed  Follow-Up: At [facility], you and your health needs are our priority.  As part of our continuing mission to provide you with exceptional heart care, we have created designated Provider Care Teams.  These Care Teams include your primary Cardiologist (physician) and Advanced Practice Providers (APPs -  Physician Assistants and Nurse Practitioners) who all work together to provide you with the care you need, when you need it.  You will need a follow up appointment in 12 months  Providers on your designated Care Team:   Murray Hodgkins, NP Christell Faith, PA-C Marrianne Mood, PA-C [place], [place]  COVID-19 Vaccine Information can be found at: [email or web address].uk For questions related to vaccine distribution or appointments, please email [email or web address] or call [phone].

## 2021-03-25 LAB — BASIC METABOLIC PANEL
BUN/Creatinine Ratio: 15 (ref 12–28)
BUN: 22 mg/dL (ref 8–27)
CO2: 25 mmol/L (ref 20–29)
Calcium: 8.7 mg/dL (ref 8.7–10.3)
Chloride: 102 mmol/L (ref 96–106)
Creatinine, Ser: 1.48 mg/dL — ABNORMAL HIGH (ref 0.57–1.00)
Glucose: 131 mg/dL — ABNORMAL HIGH (ref 65–99)
Potassium: 4.1 mmol/L (ref 3.5–5.2)
Sodium: 141 mmol/L (ref 134–144)
eGFR: 34 mL/min/{1.73_m2} — ABNORMAL LOW (ref >59)

## 2021-03-26 ENCOUNTER — Ambulatory Visit: Payer: Self-pay | Admitting: *Deleted

## 2021-03-26 DIAGNOSIS — N1832 Chronic kidney disease, stage 3b: Secondary | ICD-10-CM | POA: Diagnosis not present

## 2021-03-26 DIAGNOSIS — M129 Arthropathy, unspecified: Secondary | ICD-10-CM | POA: Diagnosis not present

## 2021-03-26 DIAGNOSIS — I130 Hypertensive heart and chronic kidney disease with heart failure and stage 1 through stage 4 chronic kidney disease, or unspecified chronic kidney disease: Secondary | ICD-10-CM | POA: Diagnosis not present

## 2021-03-26 DIAGNOSIS — Z7902 Long term (current) use of antithrombotics/antiplatelets: Secondary | ICD-10-CM | POA: Diagnosis not present

## 2021-03-26 DIAGNOSIS — J1282 Pneumonia due to coronavirus disease 2019: Secondary | ICD-10-CM | POA: Diagnosis not present

## 2021-03-26 DIAGNOSIS — K5520 Angiodysplasia of colon without hemorrhage: Secondary | ICD-10-CM | POA: Diagnosis not present

## 2021-03-26 DIAGNOSIS — I959 Hypotension, unspecified: Secondary | ICD-10-CM | POA: Diagnosis not present

## 2021-03-26 DIAGNOSIS — E785 Hyperlipidemia, unspecified: Secondary | ICD-10-CM | POA: Diagnosis not present

## 2021-03-26 DIAGNOSIS — E039 Hypothyroidism, unspecified: Secondary | ICD-10-CM | POA: Diagnosis not present

## 2021-03-26 DIAGNOSIS — I872 Venous insufficiency (chronic) (peripheral): Secondary | ICD-10-CM | POA: Diagnosis not present

## 2021-03-26 DIAGNOSIS — F32A Depression, unspecified: Secondary | ICD-10-CM | POA: Diagnosis not present

## 2021-03-26 DIAGNOSIS — I252 Old myocardial infarction: Secondary | ICD-10-CM | POA: Diagnosis not present

## 2021-03-26 DIAGNOSIS — D631 Anemia in chronic kidney disease: Secondary | ICD-10-CM | POA: Diagnosis not present

## 2021-03-26 DIAGNOSIS — Z96653 Presence of artificial knee joint, bilateral: Secondary | ICD-10-CM | POA: Diagnosis not present

## 2021-03-26 DIAGNOSIS — I2510 Atherosclerotic heart disease of native coronary artery without angina pectoris: Secondary | ICD-10-CM | POA: Diagnosis not present

## 2021-03-26 DIAGNOSIS — I5043 Acute on chronic combined systolic (congestive) and diastolic (congestive) heart failure: Secondary | ICD-10-CM | POA: Diagnosis not present

## 2021-03-26 DIAGNOSIS — R7309 Other abnormal glucose: Secondary | ICD-10-CM | POA: Diagnosis not present

## 2021-03-26 DIAGNOSIS — N179 Acute kidney failure, unspecified: Secondary | ICD-10-CM | POA: Diagnosis not present

## 2021-03-26 DIAGNOSIS — J309 Allergic rhinitis, unspecified: Secondary | ICD-10-CM | POA: Diagnosis not present

## 2021-03-26 DIAGNOSIS — U071 COVID-19: Secondary | ICD-10-CM | POA: Diagnosis not present

## 2021-03-26 DIAGNOSIS — Z7982 Long term (current) use of aspirin: Secondary | ICD-10-CM | POA: Diagnosis not present

## 2021-03-26 DIAGNOSIS — M109 Gout, unspecified: Secondary | ICD-10-CM | POA: Diagnosis not present

## 2021-03-26 DIAGNOSIS — J9601 Acute respiratory failure with hypoxia: Secondary | ICD-10-CM | POA: Diagnosis not present

## 2021-03-26 DIAGNOSIS — K219 Gastro-esophageal reflux disease without esophagitis: Secondary | ICD-10-CM | POA: Diagnosis not present

## 2021-03-26 NOTE — Telephone Encounter (Signed)
I agree with the ED disposition

## 2021-03-26 NOTE — Telephone Encounter (Signed)
Reason for Disposition  Patient sounds very sick or weak to the triager  Answer Assessment - Initial Assessment Questions 1. ONSET: "When did the cough begin?"      Coughing non stop non productive    She can't hardly talk.   Throat hurts and I have headache.  I had Covid June 3rd.   I've been in the hospital twice.  I went in and was admitted then was discharged then had to go back a day later to the hospital. 2. SEVERITY: "How bad is the cough today?"      Severe  non stop 3. SPUTUM: "Describe the color of your sputum" (none, dry cough; clear, white, yellow, green)     Dry cough  4. HEMOPTYSIS: "Are you coughing up any blood?" If so ask: "How much?" (flecks, streaks, tablespoons, etc.)     No blood 5. DIFFICULTY BREATHING: "Are you having difficulty breathing?" If Yes, ask: "How bad is it?" (e.g., mild, moderate, severe)    - MILD: No SOB at rest, mild SOB with walking, speaks normally in sentences, can lie down, no retractions, pulse < 100.    - MODERATE: SOB at rest, SOB with minimal exertion and prefers to sit, cannot lie down flat, speaks in phrases, mild retractions, audible wheezing, pulse 100-120.    - SEVERE: Very SOB at rest, speaks in single words, struggling to breathe, sitting hunched forward, retractions, pulse > 120      I'm wheezing.    I've not done much of anything.   I don't feel like it.   No fever 6. FEVER: "Do you have a fever?" If Yes, ask: "What is your temperature, how was it measured, and when did it start?"     No fever  7. CARDIAC HISTORY: "Do you have any history of heart disease?" (e.g., heart attack, congestive heart failure)      I have heart failure and kidney failure.   Not dialysis.M 8. LUNG HISTORY: "Do you have any history of lung disease?"  (e.g., pulmonary embolus, asthma, emphysema)     My lungs are weak.   I have a nebulizer machine.    "I can't keep going like this". 9. PE RISK FACTORS: "Do you have a history of blood clots?" (or: recent major  surgery, recent prolonged travel, bedridden)     No blood clot history 10. OTHER SYMPTOMS: "Do you have any other symptoms?" (e.g., runny nose, wheezing, chest pain)       Wheezing.   No chest pain.   I'm just coughing and coughing.    11. PREGNANCY: "Is there any chance you are pregnant?" "When was your last menstrual period?"       N/A 12. TRAVEL: "Have you traveled out of the country in the last month?" (e.g., travel history, exposures)       No   Been home  Protocols used: Cough - Acute Non-Productive-A-AH

## 2021-03-26 NOTE — Telephone Encounter (Signed)
I called into the office and spoke with [facility].   Dr. Rosanna Randy is out on vacation but also agrees she should go to the ED.      I called pt back.  I let her know that Dr. Rosanna Randy is on vacation until April 03, 2021.  She is still refusing to go to the ED or urgent care.    "I just want some cough medicine".   "I'm coughing so much I can't sleep or anything".   (Still coughing frequently during our conversation).  I let her know it was recommended she return to the ED due to her history and the frequent coughing and wheezing she is having.   I sent my notes to the office so they would be aware of what is going on after I called her back and let her know it was recommended she go to the ED.     She keeps repeating "I just want some cough medicine".      I sent my notes to [facility] again with pt's reply.

## 2021-03-26 NOTE — Telephone Encounter (Signed)
Pt has called in c/o non stop dry hacking cough with wheezing.   (She is coughing so much during our conversation it's hard for her to communicate.   Audible wheezing).    She was diagnosed with Covid June 3rd.    She has been in the hospital twice since her diagnoses, admitted.   She' s had 3 heart attack, has heart and kidney failure.    She is refusing to return to the hospital.    "Please isn't there anything Dr. Rosanna Randy can do or give me".  There are no appts available with any of the providers at [facility].   I'm sending a note and calling the office.   I let pt know someone would call her back.    She was agreeable to this.    I did go over the s/s to return to the ED or call 911.   Her daughter is with her when I asked if she was alone.   "I know when I need to go back and I will if I really have to but I really do not and will not go back to the hospital unless I have to or Dr. Rosanna Randy wants me to".    She can be reached at [phone].

## 2021-03-27 DIAGNOSIS — R053 Chronic cough: Secondary | ICD-10-CM | POA: Diagnosis not present

## 2021-03-27 DIAGNOSIS — Z8679 Personal history of other diseases of the circulatory system: Secondary | ICD-10-CM | POA: Diagnosis not present

## 2021-03-27 NOTE — Telephone Encounter (Signed)
Lmtcb if patient returns call okay for Stephanie Charles triage to advise her of providers message below. KW

## 2021-03-30 ENCOUNTER — Telehealth: Payer: Self-pay | Admitting: Primary Care

## 2021-03-30 NOTE — Telephone Encounter (Signed)
Attempted to contact patient at her home number to offer to schedule a Palliative Consult, I rec'd a message stating:  Your call cannot be completed at this time, please try again later".  I then attempted to reach patient's daughter Neoma Laming, with no answer - left message requesting a return call.

## 2021-03-31 DIAGNOSIS — Z96653 Presence of artificial knee joint, bilateral: Secondary | ICD-10-CM | POA: Diagnosis not present

## 2021-03-31 DIAGNOSIS — I872 Venous insufficiency (chronic) (peripheral): Secondary | ICD-10-CM | POA: Diagnosis not present

## 2021-03-31 DIAGNOSIS — M109 Gout, unspecified: Secondary | ICD-10-CM | POA: Diagnosis not present

## 2021-03-31 DIAGNOSIS — I959 Hypotension, unspecified: Secondary | ICD-10-CM | POA: Diagnosis not present

## 2021-03-31 DIAGNOSIS — Z7982 Long term (current) use of aspirin: Secondary | ICD-10-CM | POA: Diagnosis not present

## 2021-03-31 DIAGNOSIS — J309 Allergic rhinitis, unspecified: Secondary | ICD-10-CM | POA: Diagnosis not present

## 2021-03-31 DIAGNOSIS — E039 Hypothyroidism, unspecified: Secondary | ICD-10-CM | POA: Diagnosis not present

## 2021-03-31 DIAGNOSIS — I5043 Acute on chronic combined systolic (congestive) and diastolic (congestive) heart failure: Secondary | ICD-10-CM | POA: Diagnosis not present

## 2021-03-31 DIAGNOSIS — K5520 Angiodysplasia of colon without hemorrhage: Secondary | ICD-10-CM | POA: Diagnosis not present

## 2021-03-31 DIAGNOSIS — J9601 Acute respiratory failure with hypoxia: Secondary | ICD-10-CM | POA: Diagnosis not present

## 2021-03-31 DIAGNOSIS — Z7902 Long term (current) use of antithrombotics/antiplatelets: Secondary | ICD-10-CM | POA: Diagnosis not present

## 2021-03-31 DIAGNOSIS — I2510 Atherosclerotic heart disease of native coronary artery without angina pectoris: Secondary | ICD-10-CM | POA: Diagnosis not present

## 2021-03-31 DIAGNOSIS — I130 Hypertensive heart and chronic kidney disease with heart failure and stage 1 through stage 4 chronic kidney disease, or unspecified chronic kidney disease: Secondary | ICD-10-CM | POA: Diagnosis not present

## 2021-03-31 DIAGNOSIS — E785 Hyperlipidemia, unspecified: Secondary | ICD-10-CM | POA: Diagnosis not present

## 2021-03-31 DIAGNOSIS — U071 COVID-19: Secondary | ICD-10-CM | POA: Diagnosis not present

## 2021-03-31 DIAGNOSIS — K219 Gastro-esophageal reflux disease without esophagitis: Secondary | ICD-10-CM | POA: Diagnosis not present

## 2021-03-31 DIAGNOSIS — M129 Arthropathy, unspecified: Secondary | ICD-10-CM | POA: Diagnosis not present

## 2021-03-31 DIAGNOSIS — F32A Depression, unspecified: Secondary | ICD-10-CM | POA: Diagnosis not present

## 2021-03-31 DIAGNOSIS — D631 Anemia in chronic kidney disease: Secondary | ICD-10-CM | POA: Diagnosis not present

## 2021-03-31 DIAGNOSIS — N1832 Chronic kidney disease, stage 3b: Secondary | ICD-10-CM | POA: Diagnosis not present

## 2021-03-31 DIAGNOSIS — R7309 Other abnormal glucose: Secondary | ICD-10-CM | POA: Diagnosis not present

## 2021-03-31 DIAGNOSIS — J1282 Pneumonia due to coronavirus disease 2019: Secondary | ICD-10-CM | POA: Diagnosis not present

## 2021-03-31 DIAGNOSIS — I252 Old myocardial infarction: Secondary | ICD-10-CM | POA: Diagnosis not present

## 2021-03-31 DIAGNOSIS — N179 Acute kidney failure, unspecified: Secondary | ICD-10-CM | POA: Diagnosis not present

## 2021-04-01 DIAGNOSIS — Z7982 Long term (current) use of aspirin: Secondary | ICD-10-CM | POA: Diagnosis not present

## 2021-04-01 DIAGNOSIS — K5520 Angiodysplasia of colon without hemorrhage: Secondary | ICD-10-CM | POA: Diagnosis not present

## 2021-04-01 DIAGNOSIS — N1832 Chronic kidney disease, stage 3b: Secondary | ICD-10-CM | POA: Diagnosis not present

## 2021-04-01 DIAGNOSIS — J309 Allergic rhinitis, unspecified: Secondary | ICD-10-CM | POA: Diagnosis not present

## 2021-04-01 DIAGNOSIS — K219 Gastro-esophageal reflux disease without esophagitis: Secondary | ICD-10-CM | POA: Diagnosis not present

## 2021-04-01 DIAGNOSIS — I130 Hypertensive heart and chronic kidney disease with heart failure and stage 1 through stage 4 chronic kidney disease, or unspecified chronic kidney disease: Secondary | ICD-10-CM | POA: Diagnosis not present

## 2021-04-01 DIAGNOSIS — U071 COVID-19: Secondary | ICD-10-CM | POA: Diagnosis not present

## 2021-04-01 DIAGNOSIS — N179 Acute kidney failure, unspecified: Secondary | ICD-10-CM | POA: Diagnosis not present

## 2021-04-01 DIAGNOSIS — I872 Venous insufficiency (chronic) (peripheral): Secondary | ICD-10-CM | POA: Diagnosis not present

## 2021-04-01 DIAGNOSIS — I252 Old myocardial infarction: Secondary | ICD-10-CM | POA: Diagnosis not present

## 2021-04-01 DIAGNOSIS — R7309 Other abnormal glucose: Secondary | ICD-10-CM | POA: Diagnosis not present

## 2021-04-01 DIAGNOSIS — I5043 Acute on chronic combined systolic (congestive) and diastolic (congestive) heart failure: Secondary | ICD-10-CM | POA: Diagnosis not present

## 2021-04-01 DIAGNOSIS — Z7902 Long term (current) use of antithrombotics/antiplatelets: Secondary | ICD-10-CM | POA: Diagnosis not present

## 2021-04-01 DIAGNOSIS — M109 Gout, unspecified: Secondary | ICD-10-CM | POA: Diagnosis not present

## 2021-04-01 DIAGNOSIS — E785 Hyperlipidemia, unspecified: Secondary | ICD-10-CM | POA: Diagnosis not present

## 2021-04-01 DIAGNOSIS — M129 Arthropathy, unspecified: Secondary | ICD-10-CM | POA: Diagnosis not present

## 2021-04-01 DIAGNOSIS — E039 Hypothyroidism, unspecified: Secondary | ICD-10-CM | POA: Diagnosis not present

## 2021-04-01 DIAGNOSIS — I2510 Atherosclerotic heart disease of native coronary artery without angina pectoris: Secondary | ICD-10-CM | POA: Diagnosis not present

## 2021-04-01 DIAGNOSIS — J1282 Pneumonia due to coronavirus disease 2019: Secondary | ICD-10-CM | POA: Diagnosis not present

## 2021-04-01 DIAGNOSIS — I959 Hypotension, unspecified: Secondary | ICD-10-CM | POA: Diagnosis not present

## 2021-04-01 DIAGNOSIS — F32A Depression, unspecified: Secondary | ICD-10-CM | POA: Diagnosis not present

## 2021-04-01 DIAGNOSIS — Z96653 Presence of artificial knee joint, bilateral: Secondary | ICD-10-CM | POA: Diagnosis not present

## 2021-04-01 DIAGNOSIS — D631 Anemia in chronic kidney disease: Secondary | ICD-10-CM | POA: Diagnosis not present

## 2021-04-01 DIAGNOSIS — J9601 Acute respiratory failure with hypoxia: Secondary | ICD-10-CM | POA: Diagnosis not present

## 2021-04-07 DIAGNOSIS — Z96653 Presence of artificial knee joint, bilateral: Secondary | ICD-10-CM | POA: Diagnosis not present

## 2021-04-07 DIAGNOSIS — N1832 Chronic kidney disease, stage 3b: Secondary | ICD-10-CM | POA: Diagnosis not present

## 2021-04-07 DIAGNOSIS — R7309 Other abnormal glucose: Secondary | ICD-10-CM | POA: Diagnosis not present

## 2021-04-07 DIAGNOSIS — I5043 Acute on chronic combined systolic (congestive) and diastolic (congestive) heart failure: Secondary | ICD-10-CM | POA: Diagnosis not present

## 2021-04-07 DIAGNOSIS — E785 Hyperlipidemia, unspecified: Secondary | ICD-10-CM | POA: Diagnosis not present

## 2021-04-07 DIAGNOSIS — J1282 Pneumonia due to coronavirus disease 2019: Secondary | ICD-10-CM | POA: Diagnosis not present

## 2021-04-07 DIAGNOSIS — Z7902 Long term (current) use of antithrombotics/antiplatelets: Secondary | ICD-10-CM | POA: Diagnosis not present

## 2021-04-07 DIAGNOSIS — I252 Old myocardial infarction: Secondary | ICD-10-CM | POA: Diagnosis not present

## 2021-04-07 DIAGNOSIS — D631 Anemia in chronic kidney disease: Secondary | ICD-10-CM | POA: Diagnosis not present

## 2021-04-07 DIAGNOSIS — K5520 Angiodysplasia of colon without hemorrhage: Secondary | ICD-10-CM | POA: Diagnosis not present

## 2021-04-07 DIAGNOSIS — F32A Depression, unspecified: Secondary | ICD-10-CM | POA: Diagnosis not present

## 2021-04-07 DIAGNOSIS — I2510 Atherosclerotic heart disease of native coronary artery without angina pectoris: Secondary | ICD-10-CM | POA: Diagnosis not present

## 2021-04-07 DIAGNOSIS — N179 Acute kidney failure, unspecified: Secondary | ICD-10-CM | POA: Diagnosis not present

## 2021-04-07 DIAGNOSIS — Z7982 Long term (current) use of aspirin: Secondary | ICD-10-CM | POA: Diagnosis not present

## 2021-04-07 DIAGNOSIS — K219 Gastro-esophageal reflux disease without esophagitis: Secondary | ICD-10-CM | POA: Diagnosis not present

## 2021-04-07 DIAGNOSIS — M109 Gout, unspecified: Secondary | ICD-10-CM | POA: Diagnosis not present

## 2021-04-07 DIAGNOSIS — M129 Arthropathy, unspecified: Secondary | ICD-10-CM | POA: Diagnosis not present

## 2021-04-07 DIAGNOSIS — J9601 Acute respiratory failure with hypoxia: Secondary | ICD-10-CM | POA: Diagnosis not present

## 2021-04-07 DIAGNOSIS — E039 Hypothyroidism, unspecified: Secondary | ICD-10-CM | POA: Diagnosis not present

## 2021-04-07 DIAGNOSIS — J309 Allergic rhinitis, unspecified: Secondary | ICD-10-CM | POA: Diagnosis not present

## 2021-04-07 DIAGNOSIS — U071 COVID-19: Secondary | ICD-10-CM | POA: Diagnosis not present

## 2021-04-07 DIAGNOSIS — I872 Venous insufficiency (chronic) (peripheral): Secondary | ICD-10-CM | POA: Diagnosis not present

## 2021-04-07 DIAGNOSIS — I130 Hypertensive heart and chronic kidney disease with heart failure and stage 1 through stage 4 chronic kidney disease, or unspecified chronic kidney disease: Secondary | ICD-10-CM | POA: Diagnosis not present

## 2021-04-07 DIAGNOSIS — I959 Hypotension, unspecified: Secondary | ICD-10-CM | POA: Diagnosis not present

## 2021-04-08 ENCOUNTER — Other Ambulatory Visit: Payer: Self-pay | Admitting: Family Medicine

## 2021-04-08 DIAGNOSIS — D631 Anemia in chronic kidney disease: Secondary | ICD-10-CM | POA: Diagnosis not present

## 2021-04-08 DIAGNOSIS — J1282 Pneumonia due to coronavirus disease 2019: Secondary | ICD-10-CM | POA: Diagnosis not present

## 2021-04-08 DIAGNOSIS — I130 Hypertensive heart and chronic kidney disease with heart failure and stage 1 through stage 4 chronic kidney disease, or unspecified chronic kidney disease: Secondary | ICD-10-CM | POA: Diagnosis not present

## 2021-04-08 DIAGNOSIS — J9601 Acute respiratory failure with hypoxia: Secondary | ICD-10-CM | POA: Diagnosis not present

## 2021-04-08 DIAGNOSIS — M109 Gout, unspecified: Secondary | ICD-10-CM | POA: Diagnosis not present

## 2021-04-08 DIAGNOSIS — Z7982 Long term (current) use of aspirin: Secondary | ICD-10-CM | POA: Diagnosis not present

## 2021-04-08 DIAGNOSIS — K219 Gastro-esophageal reflux disease without esophagitis: Secondary | ICD-10-CM | POA: Diagnosis not present

## 2021-04-08 DIAGNOSIS — M129 Arthropathy, unspecified: Secondary | ICD-10-CM | POA: Diagnosis not present

## 2021-04-08 DIAGNOSIS — Z96653 Presence of artificial knee joint, bilateral: Secondary | ICD-10-CM | POA: Diagnosis not present

## 2021-04-08 DIAGNOSIS — I252 Old myocardial infarction: Secondary | ICD-10-CM | POA: Diagnosis not present

## 2021-04-08 DIAGNOSIS — F32A Depression, unspecified: Secondary | ICD-10-CM | POA: Diagnosis not present

## 2021-04-08 DIAGNOSIS — E785 Hyperlipidemia, unspecified: Secondary | ICD-10-CM | POA: Diagnosis not present

## 2021-04-08 DIAGNOSIS — I872 Venous insufficiency (chronic) (peripheral): Secondary | ICD-10-CM | POA: Diagnosis not present

## 2021-04-08 DIAGNOSIS — U071 COVID-19: Secondary | ICD-10-CM | POA: Diagnosis not present

## 2021-04-08 DIAGNOSIS — I959 Hypotension, unspecified: Secondary | ICD-10-CM | POA: Diagnosis not present

## 2021-04-08 DIAGNOSIS — I5043 Acute on chronic combined systolic (congestive) and diastolic (congestive) heart failure: Secondary | ICD-10-CM | POA: Diagnosis not present

## 2021-04-08 DIAGNOSIS — E039 Hypothyroidism, unspecified: Secondary | ICD-10-CM | POA: Diagnosis not present

## 2021-04-08 DIAGNOSIS — I2510 Atherosclerotic heart disease of native coronary artery without angina pectoris: Secondary | ICD-10-CM | POA: Diagnosis not present

## 2021-04-08 DIAGNOSIS — N179 Acute kidney failure, unspecified: Secondary | ICD-10-CM | POA: Diagnosis not present

## 2021-04-08 DIAGNOSIS — Z7902 Long term (current) use of antithrombotics/antiplatelets: Secondary | ICD-10-CM | POA: Diagnosis not present

## 2021-04-08 DIAGNOSIS — J309 Allergic rhinitis, unspecified: Secondary | ICD-10-CM | POA: Diagnosis not present

## 2021-04-08 DIAGNOSIS — K5520 Angiodysplasia of colon without hemorrhage: Secondary | ICD-10-CM | POA: Diagnosis not present

## 2021-04-08 DIAGNOSIS — R7309 Other abnormal glucose: Secondary | ICD-10-CM | POA: Diagnosis not present

## 2021-04-08 DIAGNOSIS — N1832 Chronic kidney disease, stage 3b: Secondary | ICD-10-CM | POA: Diagnosis not present

## 2021-04-08 NOTE — Progress Notes (Signed)
Patient ID: Stephanie Charles, female    DOB: [birth year], 87 y.o.   MRN: [id number]  HPI  Stephanie Charles is a 87 y/o female with a history of HTN, hyperlipidemia, thyroid disease, anemia, GERD, CAD, TIA and chronic heart failure.   Echo report from 03/07/21 reviewed and showed an EF of 50-55% along with moderate LVH/ LAE.   RHC/LHC done 06/07/2019 which showed: Prox LAD lesion is 90% stenosed. Prox Cx to Mid Cx lesion is 40% stenosed. Prox RCA lesion is 30% stenosed. With subsequent angioplasty and stent of LAD.  Admitted 03/11/21 due to nausea, vomiting and syncope. Was hypotensive and hypoxic. Given a dose of atropine. Given IVF and steroids. Diuretic held due to  AKI. Discharged after 2 days. Admitted 03/06/21 due to acute on chronic heart failure. Found to be covid +.  Treated with  remdisivir, steroids and Lasix. Elevated troponin thought to be due to demand ischemia. Discharged after 4 days.   She presents today for her initial visit with a chief complaint of moderate fatigue upon minimal exertion. She describes this as chronic in nature having been present for several months. She has associated shortness of breath, light-headedness, pedal edema, palpitations, anxiety and chronic pain along with this. She denies any difficulty sleeping, cough, chest pain, abdominal distention or weight gain.   Wearing support socks daily. Says that she has parameters to only take 5mg  amlodipine if SBP is < 115 so today she only took 5mg  at home.   Past Medical History:  Diagnosis Date   Anemia    Diverticulitis    GERD (gastroesophageal reflux disease)    Hyperlipidemia    Hypertension    Hypothyroidism    Myocardial infarction ([place]) 08/2017   TIA (transient ischemic attack)    1 approx 2015, 1 approx 2018   Vertigo    last episode several months ago   [name]    Past Surgical History:  Procedure Laterality Date   COLONOSCOPY WITH PROPOFOL N/A 06/22/2018   Procedure: COLONOSCOPY  WITH PROPOFOL;  Surgeon: Jonathon Bellows, MD;  Location: [facility] ENDOSCOPY;  Service: Endoscopy;  Laterality: N/A;   CORONARY STENT INTERVENTION N/A 06/07/2019   Procedure: CORONARY STENT INTERVENTION;  Surgeon: [name], MD;  Location: [place] CV LAB;  Service: Cardiovascular;  Laterality: N/A;   ESOPHAGOGASTRODUODENOSCOPY (EGD) WITH PROPOFOL N/A 06/22/2018   Procedure: ESOPHAGOGASTRODUODENOSCOPY (EGD) WITH PROPOFOL;  Surgeon: Jonathon Bellows, MD;  Location: [facility] ENDOSCOPY;  Service: Endoscopy;  Laterality: N/A;   KNEE ARTHROSCOPY Right    REPLACEMENT TOTAL KNEE BILATERAL     RIGHT/LEFT HEART CATH AND CORONARY ANGIOGRAPHY N/A 06/07/2019   Procedure: RIGHT/LEFT HEART CATH AND CORONARY ANGIOGRAPHY;  Surgeon: [name], MD;  Location: [place] CV LAB;  Service: Cardiovascular;  Laterality: N/A;   SHOULDER SURGERY Left    Family History  Problem Relation Age of Onset   Heart disease Mother    Hypertension Mother    Stroke Mother    Heart disease Father    Breast cancer Sister    Alzheimer's disease Brother    Heart attack Brother    Stroke Brother    Heart disease Brother    Clotting disorder Daughter    Mental illness Brother        died suicide   Cancer Brother        prostate   Heart disease Brother        atrial fib   Alzheimer's disease Brother  Fibromyalgia Daughter    Social History   Tobacco Use   Smoking status: Never   Smokeless tobacco: Never  Substance Use Topics   Alcohol use: No    Alcohol/week: 0.0 standard drinks   Allergies  Allergen Reactions   Latex Itching   Levofloxacin     Mouth sores   Lisinopril     Cough?   Povidone-Iodine Other (See Comments)    Severe blistering and itchiness. Severe redness   Simvastatin     Myalgias   Codeine Nausea And Vomiting   Prior to Admission medications   Medication Sig Start Date End Date Taking? Authorizing Provider  allopurinol (ZYLOPRIM) 300 MG tablet Take 1 tablet by mouth once daily 07/28/20   Yes Jerrol Banana., MD  amLODipine (NORVASC) 10 MG tablet Take 1 tablet (10 mg total) by mouth daily. 03/10/21  Yes Max Sane, MD  aspirin EC 81 MG EC tablet Take 1 tablet (81 mg total) by mouth daily. 06/09/19  Yes Gouru, Illene Silver, MD  atorvastatin (LIPITOR) 40 MG tablet Take 1 tablet (40 mg total) by mouth every evening. 03/13/21  Yes Jennye Boroughs, MD  carvedilol (COREG) 12.5 MG tablet Take 1 tablet (12.5 mg total) by mouth 2 (two) times daily with a meal. 03/13/21  Yes Jennye Boroughs, MD  clopidogrel (PLAVIX) 75 MG tablet Take 1 tablet by mouth once daily 03/17/21  Yes Jerrol Banana., MD  Ferrous Sulfate 27 MG TABS Take 27 mg by mouth at bedtime.   Yes [provider]  isosorbide mononitrate (IMDUR) 30 MG 24 hr tablet Take 1 tablet (30 mg total) by mouth daily. 03/10/21  Yes Max Sane, MD  levothyroxine (SYNTHROID) 137 MCG tablet Take 1 tablet (137 mcg total) by mouth every evening. 03/13/21  Yes Jennye Boroughs, MD  Melatonin 10 MG TABS Take 10 mg by mouth at bedtime. 03/10/21  Yes Max Sane, MD  mirtazapine (REMERON) 30 MG tablet Take 1 tablet (30 mg total) by mouth at bedtime. 03/10/21  Yes Max Sane, MD  Multiple Vitamin (MULTIVITAMIN) capsule Take 1 capsule by mouth every evening.   Yes [provider]  pantoprazole (PROTONIX) 40 MG tablet Take 1 tablet (40 mg total) by mouth daily. 09/11/20  Yes Jerrol Banana., MD  sacubitril-valsartan (ENTRESTO) 49-51 MG Take 1 tablet by mouth 2 (two) times daily. 09/30/20  Yes [name], MD  sertraline (ZOLOFT) 25 MG tablet Take 1 tablet (25 mg total) by mouth daily. 03/10/21  Yes Max Sane, MD  torsemide (DEMADEX) 20 MG tablet Take 1 tablet (20 mg total) by mouth daily. 03/10/21  Yes Max Sane, MD  amitriptyline (ELAVIL) 10 MG tablet TAKE 1 TABLET BY MOUTH AT BEDTIME 04/08/21   Jerrol Banana., MD  hydrocortisone 2.5 % cream Apply 1 application topically 2 (two) times daily as needed. 02/05/21   [provider]  zolpidem (AMBIEN CR) 6.25 MG CR tablet Take 1 tablet (6.25 mg total) by mouth at bedtime as needed for sleep. Patient not taking: No sig reported 03/10/21   Max Sane, MD    Review of Systems  Constitutional:  Positive for fatigue (easily). Negative for appetite change.  HENT:  Negative for congestion, postnasal drip and sore throat.   Eyes: Negative.   Respiratory:  Positive for shortness of breath (minimal). Negative for cough and chest tightness.   Cardiovascular:  Positive for palpitations (at times) and leg swelling (at times). Negative for chest pain.  Gastrointestinal:  Negative for abdominal distention and abdominal pain.  Endocrine: Negative.   Genitourinary: Negative.   Musculoskeletal:  Positive for arthralgias (right hip) and back pain. Negative for neck pain.  Skin: Negative.   Allergic/Immunologic: Negative.   Neurological:  Positive for light-headedness (yesterday feeling unsteady). Negative for dizziness.  Hematological:  Negative for adenopathy. Does not bruise/bleed easily.  Psychiatric/Behavioral:  Negative for dysphoric mood and sleep disturbance (sleeping on 1 pillow). The patient is nervous/anxious (at times).    Vitals:   04/09/21 1151  BP: 112/60  Pulse: (!) 52  Resp: 18  SpO2: 97%  Weight: 162 lb (73.5 kg)  Height: 5\' 2"  (1.575 m)   Wt Readings from Last 3 Encounters:  04/09/21 162 lb (73.5 kg)  03/24/21 161 lb 4 oz (73.1 kg)  03/11/21 155 lb (70.3 kg)   Lab Results  Component Value Date   CREATININE 1.48 (H) 03/24/2021   CREATININE 1.52 (H) 03/13/2021   CREATININE 1.73 (H) 03/12/2021    Physical Exam Vitals reviewed. Chaperone present: daughter.  Constitutional:      Appearance: Normal appearance.  HENT:     Head: Normocephalic and atraumatic.  Cardiovascular:     Rate and Rhythm: Regular rhythm. Bradycardia present.  Pulmonary:     Effort: Pulmonary effort is normal. No respiratory distress.     Breath sounds: No wheezing  or rales.  Abdominal:     General: There is no distension.     Palpations: Abdomen is soft.  Musculoskeletal:        General: No tenderness.     Cervical back: Normal range of motion and neck supple.     Right lower leg: No edema.     Left lower leg: No edema.  Skin:    General: Skin is warm and dry.  Neurological:     General: No focal deficit present.     Mental Status: She is alert and oriented to person, place, and time.  Psychiatric:        Mood and Affect: Mood normal.        Behavior: Behavior normal.        Thought Content: Thought content normal.     Assessment & Plan:  1: Chronic heart failure with preserved ejection fraction with structural changes (LVH/ LAE)- - NYHA class III - euvolemic today - weighing daily; reminded to call for an overnight weight gain of > 2 pounds or a weekly weight gain of >5 pounds - not adding salt to her food and has been looking at food labels for sodium content - wearing support socks daily - BP will not allow for entresto titration - saw cardiology Rockey Situ) 03/24/21 - BNP 03/06/21 was 960.1  2: HTN- - BP good although on the low side (112/60) - discussed that she probably doesn't need to take amlodipine at all but she would like to discuss with Dr. Rockey Situ first - saw PCP Rosanna Randy) 08/21/20 - BMP 03/24/21 reviewed and showed sodium 141, potassium 4.1, creatinine 1.48 and GFR 34  Medication list reviewed.   Return in 2 months or sooner for any questions/problems before then.

## 2021-04-08 NOTE — Telephone Encounter (Signed)
Courtesy refill. Future visit in 5 days .

## 2021-04-09 ENCOUNTER — Telehealth: Payer: Self-pay | Admitting: Cardiovascular Disease

## 2021-04-09 ENCOUNTER — Encounter: Payer: Self-pay | Admitting: Family

## 2021-04-09 ENCOUNTER — Other Ambulatory Visit: Payer: Self-pay

## 2021-04-09 ENCOUNTER — Ambulatory Visit: Payer: Medicare Other | Attending: Family | Admitting: Family

## 2021-04-09 VITALS — BP 112/60 | HR 52 | Resp 18 | Ht 62.0 in | Wt 162.0 lb

## 2021-04-09 DIAGNOSIS — M47816 Spondylosis without myelopathy or radiculopathy, lumbar region: Secondary | ICD-10-CM | POA: Insufficient documentation

## 2021-04-09 DIAGNOSIS — Z7901 Long term (current) use of anticoagulants: Secondary | ICD-10-CM | POA: Insufficient documentation

## 2021-04-09 DIAGNOSIS — Z7982 Long term (current) use of aspirin: Secondary | ICD-10-CM | POA: Diagnosis not present

## 2021-04-09 DIAGNOSIS — Z7902 Long term (current) use of antithrombotics/antiplatelets: Secondary | ICD-10-CM | POA: Insufficient documentation

## 2021-04-09 DIAGNOSIS — Z885 Allergy status to narcotic agent status: Secondary | ICD-10-CM | POA: Diagnosis not present

## 2021-04-09 DIAGNOSIS — M47896 Other spondylosis, lumbar region: Secondary | ICD-10-CM | POA: Diagnosis not present

## 2021-04-09 DIAGNOSIS — Z8249 Family history of ischemic heart disease and other diseases of the circulatory system: Secondary | ICD-10-CM | POA: Insufficient documentation

## 2021-04-09 DIAGNOSIS — K219 Gastro-esophageal reflux disease without esophagitis: Secondary | ICD-10-CM | POA: Insufficient documentation

## 2021-04-09 DIAGNOSIS — Z79899 Other long term (current) drug therapy: Secondary | ICD-10-CM | POA: Insufficient documentation

## 2021-04-09 DIAGNOSIS — F419 Anxiety disorder, unspecified: Secondary | ICD-10-CM | POA: Diagnosis not present

## 2021-04-09 DIAGNOSIS — I2510 Atherosclerotic heart disease of native coronary artery without angina pectoris: Secondary | ICD-10-CM | POA: Diagnosis not present

## 2021-04-09 DIAGNOSIS — Z8616 Personal history of COVID-19: Secondary | ICD-10-CM | POA: Insufficient documentation

## 2021-04-09 DIAGNOSIS — G8929 Other chronic pain: Secondary | ICD-10-CM | POA: Insufficient documentation

## 2021-04-09 DIAGNOSIS — Z8673 Personal history of transient ischemic attack (TIA), and cerebral infarction without residual deficits: Secondary | ICD-10-CM | POA: Diagnosis not present

## 2021-04-09 DIAGNOSIS — I5032 Chronic diastolic (congestive) heart failure: Secondary | ICD-10-CM | POA: Insufficient documentation

## 2021-04-09 DIAGNOSIS — D649 Anemia, unspecified: Secondary | ICD-10-CM | POA: Diagnosis not present

## 2021-04-09 DIAGNOSIS — M545 Low back pain, unspecified: Secondary | ICD-10-CM | POA: Diagnosis not present

## 2021-04-09 DIAGNOSIS — R42 Dizziness and giddiness: Secondary | ICD-10-CM | POA: Diagnosis not present

## 2021-04-09 DIAGNOSIS — R002 Palpitations: Secondary | ICD-10-CM | POA: Diagnosis not present

## 2021-04-09 DIAGNOSIS — R0602 Shortness of breath: Secondary | ICD-10-CM | POA: Diagnosis not present

## 2021-04-09 DIAGNOSIS — I110 Hypertensive heart disease with heart failure: Secondary | ICD-10-CM | POA: Diagnosis not present

## 2021-04-09 DIAGNOSIS — E785 Hyperlipidemia, unspecified: Secondary | ICD-10-CM | POA: Diagnosis not present

## 2021-04-09 DIAGNOSIS — Z888 Allergy status to other drugs, medicaments and biological substances status: Secondary | ICD-10-CM | POA: Insufficient documentation

## 2021-04-09 DIAGNOSIS — I10 Essential (primary) hypertension: Secondary | ICD-10-CM

## 2021-04-09 NOTE — Patient Instructions (Signed)
Continue weighing daily and call for an overnight weight gain of > 2 pounds or a weekly weight gain of >5 pounds. 

## 2021-04-09 NOTE — Telephone Encounter (Signed)
Patient calling  States at [facility] appt today her BP was 112/60 Would like to know if she can stop amlodipine medication until her BP gets straightened out  Please call to discuss

## 2021-04-10 ENCOUNTER — Telehealth: Payer: Self-pay | Admitting: Cardiovascular Disease

## 2021-04-10 ENCOUNTER — Telehealth: Payer: Self-pay | Admitting: Family

## 2021-04-10 NOTE — Telephone Encounter (Signed)
Faxed [facility] regarding Mrs. Stephanie Charles Per PharmD "Ok to take medrol dose pack. She should just monitor her fluid status"

## 2021-04-10 NOTE — Telephone Encounter (Signed)
Patient called to say that she recently went to see an orthopaedic doctor regarding her back pain because it was getting worse. She said that he told her that she had spurs on her back but that he couldn't prescribe anything because she is currently taking plavix as well as because her renal function is low. He suggested to call cardiology to discuss using medrol dose pack with her renal function and plavix use.   Advised Stephanie Charles to contact her general cardiologist Rockey Situ) regarding her plavix use with the steroids. She said that she had his number and would give him a call. I told her I would also message Dr. Donivan Scull nurse to make her aware of this as well and she was appreciative of that.

## 2021-04-10 NOTE — Telephone Encounter (Signed)
Ok to take medrol dose pack. She should just monitor her fluid status

## 2021-04-10 NOTE — Telephone Encounter (Signed)
Patient saw orthopedic office yesterday for back pain. Office states that they can not prescribe anything due to her blood thinner. Patient is wanting to know if "Medol dose pack" would be a contradiction with her medication. Patient was seen at [facility], by [facility].  Please advise by contacting [facility] YBNLW:[phone] option 2 fax: [phone]

## 2021-04-11 ENCOUNTER — Other Ambulatory Visit: Payer: Self-pay | Admitting: Family Medicine

## 2021-04-11 DIAGNOSIS — I5043 Acute on chronic combined systolic (congestive) and diastolic (congestive) heart failure: Secondary | ICD-10-CM

## 2021-04-11 DIAGNOSIS — I200 Unstable angina: Secondary | ICD-10-CM

## 2021-04-11 MED ORDER — AMITRIPTYLINE HCL 10 MG PO TABS: 10.0000 mg | ORAL_TABLET | Freq: Every day | ORAL | 0 refills | Status: DC

## 2021-04-11 MED ORDER — TORSEMIDE 20 MG PO TABS: 20.0000 mg | ORAL_TABLET | Freq: Every day | ORAL | 0 refills | Status: DC

## 2021-04-11 MED ORDER — ISOSORBIDE MONONITRATE ER 30 MG PO TB24: 30.0000 mg | ORAL_TABLET | Freq: Every day | ORAL | 0 refills | Status: DC

## 2021-04-13 ENCOUNTER — Other Ambulatory Visit: Payer: Self-pay

## 2021-04-13 ENCOUNTER — Ambulatory Visit (INDEPENDENT_AMBULATORY_CARE_PROVIDER_SITE_OTHER): Payer: Medicare Other | Admitting: Family Medicine

## 2021-04-13 ENCOUNTER — Encounter: Payer: Self-pay | Admitting: Family Medicine

## 2021-04-13 VITALS — BP 122/67 | HR 73 | Temp 98.6°F | Resp 20 | Wt 165.0 lb

## 2021-04-13 DIAGNOSIS — J1282 Pneumonia due to coronavirus disease 2019: Secondary | ICD-10-CM

## 2021-04-13 DIAGNOSIS — I5042 Chronic combined systolic (congestive) and diastolic (congestive) heart failure: Secondary | ICD-10-CM

## 2021-04-13 DIAGNOSIS — Z8673 Personal history of transient ischemic attack (TIA), and cerebral infarction without residual deficits: Secondary | ICD-10-CM | POA: Diagnosis not present

## 2021-04-13 DIAGNOSIS — E782 Mixed hyperlipidemia: Secondary | ICD-10-CM | POA: Diagnosis not present

## 2021-04-13 DIAGNOSIS — U071 COVID-19: Secondary | ICD-10-CM

## 2021-04-13 DIAGNOSIS — I5043 Acute on chronic combined systolic (congestive) and diastolic (congestive) heart failure: Secondary | ICD-10-CM

## 2021-04-13 DIAGNOSIS — N1831 Chronic kidney disease, stage 3a: Secondary | ICD-10-CM | POA: Diagnosis not present

## 2021-04-13 DIAGNOSIS — I200 Unstable angina: Secondary | ICD-10-CM

## 2021-04-13 DIAGNOSIS — F3342 Major depressive disorder, recurrent, in full remission: Secondary | ICD-10-CM

## 2021-04-13 MED ORDER — AMITRIPTYLINE HCL 10 MG PO TABS: 10.0000 mg | ORAL_TABLET | Freq: Every day | ORAL | 0 refills | Status: DC

## 2021-04-13 MED ORDER — ISOSORBIDE MONONITRATE ER 30 MG PO TB24: 30.0000 mg | ORAL_TABLET | Freq: Every day | ORAL | 0 refills | Status: DC

## 2021-04-13 MED ORDER — TORSEMIDE 20 MG PO TABS: 20.0000 mg | ORAL_TABLET | Freq: Every day | ORAL | 0 refills | Status: DC

## 2021-04-13 NOTE — Telephone Encounter (Signed)
Was able to reach out to Mrs. Stephanie Charles with advice from Dr. Rockey Situ regarding her amlodipine (refer to earlier phone encounters). Dr. Rockey Situ advised  Okay to hold the amlodipine   Mrs. Stephanie Charles reports very happy to hear, she will stop taking this meds as of tomorrow and monitor her BP, will call in for any concerns.  Amlodipine removed from med list

## 2021-04-13 NOTE — Progress Notes (Signed)
Established patient visit   Patient: Stephanie Charles   DOB: 1929-11-09   87 y.o. Female  MRN: [id number] Visit Date: 04/13/2021  Today's healthcare provider: [name], MD   Chief Complaint  Patient presents with   Hospitalization Follow-up   Subjective    HPI  She was admitted 5 weeks ago for pulmonary edema and dyspnea and admitted a month ago several days later for COVID symptoms and weakness.  She is slowly improving.  She had a COVID-pneumonia and is getting her strength back slowly.  Her breathing now is fine. Follow up Hospitalization  Patient was admitted to [facility] on 03/11/2021 and discharged on 03/13/2021. She was treated for covid symptoms and hypotension . Treatment for this included IV fluids and holding all hypertensive medications. Patient reports that she has started back on amlodipine for about 1 week now.   She reports good compliance with treatment.  BP Readings from Last 3 Encounters:  04/13/21 122/67  04/09/21 112/60  03/24/21 (!) 122/52    Wt Readings from Last 3 Encounters:  04/13/21 165 lb (74.8 kg)  04/09/21 162 lb (73.5 kg)  03/24/21 161 lb 4 oz (73.1 kg)         Medications: Outpatient Medications Prior to Visit  Medication Sig   allopurinol (ZYLOPRIM) 300 MG tablet Take 1 tablet by mouth once daily   amitriptyline (ELAVIL) 10 MG tablet Take 1 tablet (10 mg total) by mouth at bedtime.   amLODipine (NORVASC) 10 MG tablet Take 1 tablet (10 mg total) by mouth daily.   aspirin EC 81 MG EC tablet Take 1 tablet (81 mg total) by mouth daily.   atorvastatin (LIPITOR) 40 MG tablet Take 1 tablet (40 mg total) by mouth every evening.   carvedilol (COREG) 12.5 MG tablet Take 1 tablet (12.5 mg total) by mouth 2 (two) times daily with a meal.   clopidogrel (PLAVIX) 75 MG tablet Take 1 tablet by mouth once daily   Ferrous Sulfate 27 MG TABS Take 27 mg by mouth at bedtime.   hydrocortisone 2.5 % cream Apply 1 application topically 2 (two) times  daily as needed.   isosorbide mononitrate (IMDUR) 30 MG 24 hr tablet Take 1 tablet (30 mg total) by mouth daily.   levothyroxine (SYNTHROID) 137 MCG tablet Take 1 tablet (137 mcg total) by mouth every evening.   Melatonin 10 MG TABS Take 10 mg by mouth at bedtime.   mirtazapine (REMERON) 30 MG tablet Take 1 tablet (30 mg total) by mouth at bedtime.   Multiple Vitamin (MULTIVITAMIN) capsule Take 1 capsule by mouth every evening.   pantoprazole (PROTONIX) 40 MG tablet Take 1 tablet (40 mg total) by mouth daily.   sacubitril-valsartan (ENTRESTO) 49-51 MG Take 1 tablet by mouth 2 (two) times daily.   sertraline (ZOLOFT) 25 MG tablet Take 1 tablet (25 mg total) by mouth daily.   torsemide (DEMADEX) 20 MG tablet Take 1 tablet (20 mg total) by mouth daily.   zolpidem (AMBIEN CR) 6.25 MG CR tablet Take 1 tablet (6.25 mg total) by mouth at bedtime as needed for sleep. (Patient not taking: No sig reported)   No facility-administered medications prior to visit.    Review of Systems  Constitutional:  Positive for activity change and fatigue.  Respiratory:  Positive for cough.   Cardiovascular:  Negative for chest pain, palpitations and leg swelling.  Endocrine: Negative for cold intolerance, heat intolerance, polydipsia, polyphagia and polyuria.  Musculoskeletal:  Negative for  arthralgias and myalgias.  Neurological:  Negative for dizziness, light-headedness and headaches.  Psychiatric/Behavioral:  Negative for agitation, self-injury, sleep disturbance and suicidal ideas. The patient is not nervous/anxious.        Objective    BP 122/67   Pulse 73   Temp 98.6 F (37 C)   Resp 20   Wt 165 lb (74.8 kg)   SpO2 97%   BMI 30.18 kg/m  BP Readings from Last 3 Encounters:  04/13/21 122/67  04/09/21 112/60  03/24/21 (!) 122/52   Wt Readings from Last 3 Encounters:  04/13/21 165 lb (74.8 kg)  04/09/21 162 lb (73.5 kg)  03/24/21 161 lb 4 oz (73.1 kg)       Physical Exam Vitals reviewed.   Constitutional:      Appearance: Normal appearance.  HENT:     Right Ear: External ear normal.     Left Ear: External ear normal.     Nose: Nose normal.  Eyes:     General: No scleral icterus.    Conjunctiva/sclera: Conjunctivae normal.  Cardiovascular:     Rate and Rhythm: Normal rate and regular rhythm.     Heart sounds: Normal heart sounds.  Pulmonary:     Effort: Pulmonary effort is normal.     Breath sounds: Normal breath sounds.  Abdominal:     Palpations: Abdomen is soft.     Tenderness: There is no abdominal tenderness.  Lymphadenopathy:     Cervical: No cervical adenopathy.  Skin:    General: Skin is warm and dry.     Comments: Fair skin.  Neurological:     General: No focal deficit present.     Mental Status: She is alert and oriented to person, place, and time.  Psychiatric:        Mood and Affect: Mood normal.        Behavior: Behavior normal.        Thought Content: Thought content normal.        Judgment: Judgment normal.      No results found for any visits on 04/13/21.  Assessment & Plan     1. Acute on chronic combined systolic and diastolic CHF (congestive heart failure) (HCC) Clinically improved.  Follow daily weights.  Continue Demadex till next visit. - torsemide (DEMADEX) 20 MG tablet; Take 1 tablet (20 mg total) by mouth daily.  Dispense: 30 tablet; Refill: 0  2. Unstable angina (HCC) Clinically stable.  Carvedilol Isordil Sorbide - isosorbide mononitrate (IMDUR) 30 MG 24 hr tablet; Take 1 tablet (30 mg total) by mouth daily.  Dispense: 30 tablet; Refill: 0  3. Chronic combined systolic and diastolic CHF (congestive heart failure) ([place]) On Entresto, watch blood pressure closely  4. Pneumonia due to COVID-19 virus Chest x-ray on next visit in 2 months.  5. Stage 3a chronic kidney disease ([place]) Probable lab work on next visit if not done before then  6. Mixed hyperlipidemia On atorvastatin  7. History of embolic stroke without residual  deficits   8. Recurrent major depressive disorder, in full remission ([place]) On sertraline at low-dose and mirtazapine   No follow-ups on file.      I, [name], MD, have reviewed all documentation for this visit. The documentation on 04/17/21 for the exam, diagnosis, procedures, and orders are all accurate and complete.    Eleanor Gatliff Cranford Mon, MD  [facility] [phone] (phone) [phone] (fax)  [place]

## 2021-04-14 ENCOUNTER — Inpatient Hospital Stay: Payer: Medicare Other | Admitting: Family Medicine

## 2021-04-16 ENCOUNTER — Telehealth: Payer: Self-pay | Admitting: Primary Care

## 2021-04-16 NOTE — Telephone Encounter (Signed)
I also attempted to reach patient's daughter, Overton Mam, to see if they wished to pursue Palliative services or if I needed to cancel the referral, no answer - left message with my name and call back number.  I requested a call back by tomorrow, 04/17/21, afternoon and if I did not hear back from anyone that I would cancel the referral and notify the MD.

## 2021-04-16 NOTE — Telephone Encounter (Signed)
Attempted to contact patient's daughter on her cell, [name] (who lives with patient), with no answer left message requesting a return call to let us know if they wish to schedule a Palliative Consult or if we need to cancel the referral.  Left my name and call back number.

## 2021-04-22 ENCOUNTER — Telehealth: Payer: Self-pay

## 2021-04-22 NOTE — Telephone Encounter (Signed)
Ok to refer.

## 2021-04-22 NOTE — Telephone Encounter (Signed)
Copied from [place] [id number]. Topic: Referral - Request for Referral >> Apr 22, 2021  3:34 PM Tessa Lerner A wrote: /Has patient seen PCP for this complaint? Yes.    *If NO, is insurance requiring patient see PCP for this issue before PCP can refer them?  Referral for which specialty: Orthopedic Specialist / Surgeon  Preferred provider/office: Patient has no preference   Reason for referral: Back pain

## 2021-04-24 DIAGNOSIS — U071 COVID-19: Secondary | ICD-10-CM | POA: Diagnosis not present

## 2021-04-24 DIAGNOSIS — D631 Anemia in chronic kidney disease: Secondary | ICD-10-CM | POA: Diagnosis not present

## 2021-04-24 DIAGNOSIS — F32A Depression, unspecified: Secondary | ICD-10-CM | POA: Diagnosis not present

## 2021-04-24 DIAGNOSIS — N1832 Chronic kidney disease, stage 3b: Secondary | ICD-10-CM | POA: Diagnosis not present

## 2021-04-24 DIAGNOSIS — I130 Hypertensive heart and chronic kidney disease with heart failure and stage 1 through stage 4 chronic kidney disease, or unspecified chronic kidney disease: Secondary | ICD-10-CM | POA: Diagnosis not present

## 2021-04-24 DIAGNOSIS — I2510 Atherosclerotic heart disease of native coronary artery without angina pectoris: Secondary | ICD-10-CM | POA: Diagnosis not present

## 2021-04-24 DIAGNOSIS — J1282 Pneumonia due to coronavirus disease 2019: Secondary | ICD-10-CM | POA: Diagnosis not present

## 2021-04-24 DIAGNOSIS — I5043 Acute on chronic combined systolic (congestive) and diastolic (congestive) heart failure: Secondary | ICD-10-CM | POA: Diagnosis not present

## 2021-04-24 DIAGNOSIS — M109 Gout, unspecified: Secondary | ICD-10-CM | POA: Diagnosis not present

## 2021-04-24 DIAGNOSIS — J9601 Acute respiratory failure with hypoxia: Secondary | ICD-10-CM | POA: Diagnosis not present

## 2021-04-29 DIAGNOSIS — H40003 Preglaucoma, unspecified, bilateral: Secondary | ICD-10-CM | POA: Diagnosis not present

## 2021-04-29 DIAGNOSIS — H2513 Age-related nuclear cataract, bilateral: Secondary | ICD-10-CM | POA: Diagnosis not present

## 2021-05-20 ENCOUNTER — Telehealth: Payer: Self-pay

## 2021-05-20 ENCOUNTER — Other Ambulatory Visit: Payer: Self-pay | Admitting: Family Medicine

## 2021-05-20 DIAGNOSIS — I10 Essential (primary) hypertension: Secondary | ICD-10-CM

## 2021-05-20 DIAGNOSIS — K219 Gastro-esophageal reflux disease without esophagitis: Secondary | ICD-10-CM

## 2021-05-20 MED ORDER — AMLODIPINE BESYLATE 10 MG PO TABS: 10.0000 mg | ORAL_TABLET | Freq: Every day | ORAL | 3 refills | Status: DC

## 2021-05-20 NOTE — Telephone Encounter (Signed)
Copied from [place] [phone]. Topic: General - Other >> May 20, 2021 12:33 PM Leward Quan A wrote: Reason for CRM: Patient called in to inquire of Dr Rosanna Randy about whether he want to keep her on the amLODipine (NORVASC) tablet 10 mg that was given while she was in the hospital she has 2 tablets left please advice. Can be reached at Ph#  [phone]

## 2021-05-20 NOTE — Telephone Encounter (Signed)
Please review. Thanks!  

## 2021-05-20 NOTE — Telephone Encounter (Signed)
Advised patient. Medication sent into the pharmacy.

## 2021-05-21 DIAGNOSIS — M4156 Other secondary scoliosis, lumbar region: Secondary | ICD-10-CM | POA: Diagnosis not present

## 2021-05-21 DIAGNOSIS — M5136 Other intervertebral disc degeneration, lumbar region: Secondary | ICD-10-CM | POA: Diagnosis not present

## 2021-05-21 DIAGNOSIS — M4120 Other idiopathic scoliosis, site unspecified: Secondary | ICD-10-CM | POA: Diagnosis not present

## 2021-05-26 ENCOUNTER — Ambulatory Visit: Payer: Medicare Other | Admitting: Cardiovascular Disease

## 2021-06-02 ENCOUNTER — Other Ambulatory Visit: Payer: Self-pay

## 2021-06-02 MED ORDER — ENTRESTO 49-51 MG PO TABS: 1.0000 | ORAL_TABLET | Freq: Two times a day (BID) | ORAL | 3 refills | Status: DC

## 2021-06-10 ENCOUNTER — Ambulatory Visit: Payer: Medicare Other | Admitting: Family

## 2021-06-11 ENCOUNTER — Other Ambulatory Visit: Payer: Self-pay | Admitting: Family Medicine

## 2021-06-11 DIAGNOSIS — I5043 Acute on chronic combined systolic (congestive) and diastolic (congestive) heart failure: Secondary | ICD-10-CM

## 2021-06-11 NOTE — Telephone Encounter (Signed)
Requested medication (s) are due for refill today -yes-if to continue  Requested medication (s) are on the active medication list -yes  Future visit scheduled -yes  Last refill: 05/13/21  Notes to clinic: Patient was only given #30 Rx with no RF- is this something to continue- sent for review   Requested Prescriptions  Pending Prescriptions Disp Refills   torsemide (DEMADEX) 20 MG tablet [Pharmacy Med Name: Torsemide 20 MG Oral Tablet] 30 tablet 0    Sig: Take 1 tablet by mouth once daily     Cardiovascular:  Diuretics - Loop Failed - 06/11/2021 12:59 PM      Failed - Cr in normal range and within 360 days    Creatinine  Date Value Ref Range Status  04/26/2014 1.20 0.60 - 1.30 mg/dL Final   Creatinine, Ser  Date Value Ref Range Status  03/24/2021 1.48 (H) 0.57 - 1.00 mg/dL Final          Passed - K in normal range and within 360 days    Potassium  Date Value Ref Range Status  03/24/2021 4.1 3.5 - 5.2 mmol/L Final  04/26/2014 3.9 3.5 - 5.1 mmol/L Final          Passed - Ca in normal range and within 360 days    Calcium  Date Value Ref Range Status  03/24/2021 8.7 8.7 - 10.3 mg/dL Final   Calcium, Total  Date Value Ref Range Status  04/26/2014 8.8 8.5 - 10.1 mg/dL Final          Passed - Na in normal range and within 360 days    Sodium  Date Value Ref Range Status  03/24/2021 141 134 - 144 mmol/L Final  04/26/2014 133 (L) 136 - 145 mmol/L Final          Passed - Last BP in normal range    BP Readings from Last 1 Encounters:  04/13/21 122/67          Passed - Valid encounter within last 6 months    Recent Outpatient Visits           1 month ago Chronic combined systolic and diastolic CHF (congestive heart failure) [facility])   [facility] Jerrol Banana., MD   9 months ago Benign essential HTN   [facility] Jerrol Banana., MD   1 year ago Benign essential HTN   [facility] Jerrol Banana., MD    1 year ago LLQ abdominal pain   [facility] Carles Collet M, [place]   2 years ago Non-ST elevation (NSTEMI) myocardial infarction [facility])   [facility] Jerrol Banana., MD       Future Appointments             In 4 days Jerrol Banana., MD [facility]               Requested Prescriptions  Pending Prescriptions Disp Refills   torsemide (DEMADEX) 20 MG tablet [Pharmacy Med Name: Torsemide 20 MG Oral Tablet] 30 tablet 0    Sig: Take 1 tablet by mouth once daily     Cardiovascular:  Diuretics - Loop Failed - 06/11/2021 12:59 PM      Failed - Cr in normal range and within 360 days    Creatinine  Date Value Ref Range Status  04/26/2014 1.20 0.60 - 1.30 mg/dL Final   Creatinine, Ser  Date Value Ref Range Status  03/24/2021 1.48 (H) 0.57 - 1.00 mg/dL Final          Passed - K in normal range and within 360 days    Potassium  Date Value Ref Range Status  03/24/2021 4.1 3.5 - 5.2 mmol/L Final  04/26/2014 3.9 3.5 - 5.1 mmol/L Final          Passed - Ca in normal range and within 360 days    Calcium  Date Value Ref Range Status  03/24/2021 8.7 8.7 - 10.3 mg/dL Final   Calcium, Total  Date Value Ref Range Status  04/26/2014 8.8 8.5 - 10.1 mg/dL Final          Passed - Na in normal range and within 360 days    Sodium  Date Value Ref Range Status  03/24/2021 141 134 - 144 mmol/L Final  04/26/2014 133 (L) 136 - 145 mmol/L Final          Passed - Last BP in normal range    BP Readings from Last 1 Encounters:  04/13/21 122/67          Passed - Valid encounter within last 6 months    Recent Outpatient Visits           1 month ago Chronic combined systolic and diastolic CHF (congestive heart failure) [facility])   [facility] Jerrol Banana., MD   9 months ago Benign essential HTN   [facility] Jerrol Banana., MD   1 year ago Benign essential HTN    [facility] Jerrol Banana., MD   1 year ago LLQ abdominal pain   [facility] Carles Collet M, [place]   2 years ago Non-ST elevation (NSTEMI) myocardial infarction [facility])   [facility] Jerrol Banana., MD       Future Appointments             In 4 days Jerrol Banana., MD [facility], [place]

## 2021-06-12 ENCOUNTER — Other Ambulatory Visit: Payer: Self-pay | Admitting: Family Medicine

## 2021-06-12 NOTE — Telephone Encounter (Signed)
Requested medication (s) are due for refill today: Yes  Requested medication (s) are on the active medication list: No  Last refill:  Unknown  Future visit scheduled: Yes  Notes to clinic:  Rx discontinued at discharge from hospital, unsure if provider wants to reorder     Requested Prescriptions  Pending Prescriptions Disp Refills   albuterol (PROVENTIL) (2.5 MG/3ML) 0.083% nebulizer solution [Pharmacy Med Name: Albuterol Sulfate (2.5 MG/3ML) 0.083% Inhalation Nebulization Solution] 75 mL 0    Sig: USE 1 VIAL IN NEBULIZER EVERY 4 HOURS AS NEEDED     Pulmonology:  Beta Agonists Failed - 06/12/2021  1:10 PM      Failed - One inhaler should last at least one month. If the patient is requesting refills earlier, contact the patient to check for uncontrolled symptoms.      Passed - Valid encounter within last 12 months    Recent Outpatient Visits           2 months ago Chronic combined systolic and diastolic CHF (congestive heart failure) [facility])   [facility] Jerrol Banana., MD   9 months ago Benign essential HTN   [facility] Jerrol Banana., MD   1 year ago Benign essential HTN   [facility] Jerrol Banana., MD   1 year ago LLQ abdominal pain   [facility] Carles Collet M, [place]   2 years ago Non-ST elevation (NSTEMI) myocardial infarction [facility])   [facility] Jerrol Banana., MD       Future Appointments             In 3 days Jerrol Banana., MD [facility], [place]

## 2021-06-12 NOTE — Telephone Encounter (Signed)
Patient was to continue torsemide until next OV (which is scheduled next week). She was Rx'd this in 04/2021. Did you want her to continue this med? Or wait to discuss during OV? Please advise. Thanks!

## 2021-06-15 ENCOUNTER — Encounter: Payer: Self-pay | Admitting: Family Medicine

## 2021-06-15 ENCOUNTER — Ambulatory Visit (INDEPENDENT_AMBULATORY_CARE_PROVIDER_SITE_OTHER): Payer: Medicare Other | Admitting: Family Medicine

## 2021-06-15 ENCOUNTER — Other Ambulatory Visit: Payer: Self-pay

## 2021-06-15 VITALS — BP 133/65 | HR 86 | Temp 98.2°F | Resp 18 | Ht 62.0 in | Wt 164.0 lb

## 2021-06-15 DIAGNOSIS — G459 Transient cerebral ischemic attack, unspecified: Secondary | ICD-10-CM

## 2021-06-15 DIAGNOSIS — Z8673 Personal history of transient ischemic attack (TIA), and cerebral infarction without residual deficits: Secondary | ICD-10-CM | POA: Diagnosis not present

## 2021-06-15 DIAGNOSIS — M25472 Effusion, left ankle: Secondary | ICD-10-CM

## 2021-06-15 DIAGNOSIS — M25471 Effusion, right ankle: Secondary | ICD-10-CM

## 2021-06-15 DIAGNOSIS — Z23 Encounter for immunization: Secondary | ICD-10-CM | POA: Diagnosis not present

## 2021-06-15 DIAGNOSIS — J1282 Pneumonia due to coronavirus disease 2019: Secondary | ICD-10-CM | POA: Diagnosis not present

## 2021-06-15 DIAGNOSIS — F3341 Major depressive disorder, recurrent, in partial remission: Secondary | ICD-10-CM

## 2021-06-15 DIAGNOSIS — I10 Essential (primary) hypertension: Secondary | ICD-10-CM | POA: Diagnosis not present

## 2021-06-15 DIAGNOSIS — F5101 Primary insomnia: Secondary | ICD-10-CM

## 2021-06-15 DIAGNOSIS — I5043 Acute on chronic combined systolic (congestive) and diastolic (congestive) heart failure: Secondary | ICD-10-CM | POA: Diagnosis not present

## 2021-06-15 DIAGNOSIS — N1831 Chronic kidney disease, stage 3a: Secondary | ICD-10-CM

## 2021-06-15 DIAGNOSIS — U071 COVID-19: Secondary | ICD-10-CM

## 2021-06-15 NOTE — Progress Notes (Signed)
I,[name],acting as a scribe for [name], MD.,have documented all relevant documentation on the behalf of [name], MD,as directed by  [name], MD while in the presence of [name], MD.   Established patient visit   Patient: Stephanie Charles   DOB: 03-Jan-1930   87 y.o. Female  MRN: [id number] Visit Date: 06/15/2021  Today's healthcare provider: [name], MD   Chief Complaint  Patient presents with   Follow-up   Congestive Heart Failure   Diarrhea   Subjective    HPI  87 year old female comes in today for follow-up.  Everything is stable for her.  She states she is feeling older.  Medications are reviewed and appear to be correct. She is feeling fine so she really does not want a chest x-ray for follow-up of her COVID-pneumonia. Follow up for Acute on chronic combined systolic and diastolic CHF (congestive heart failure)  The patient was last seen for this 2 months ago. Changes made at last visit include; Clinically improved. Follow daily weights. Continue Demadex till next visit.  She reports good compliance with treatment. She feels that condition is Unchanged. She is not having side effects. none  ----------------------------------------------------------------------------------------- Follow up for Pneumonia due to COVID-19 virus  The patient was last seen for this 2 months ago. Changes made at last visit include; Chest x-ray on next visit in 2 months.  She reports good compliance with treatment. She feels that condition is Unchanged. She is not having side effects. none  Still weak and fatigued. -----------------------------------------------------------------------------------------   Patient states she has had diarrhea for around one month. Patient states she is having a bowel movement up to 3 times a day. Stools are watery. Patient states her "food seems to go straight through  her."    Medications: Outpatient Medications Prior to Visit  Medication Sig   allopurinol (ZYLOPRIM) 300 MG tablet Take 1 tablet by mouth once daily   amitriptyline (ELAVIL) 10 MG tablet Take 1 tablet (10 mg total) by mouth at bedtime.   amLODipine (NORVASC) 10 MG tablet Take 1 tablet (10 mg total) by mouth daily.   aspirin EC 81 MG EC tablet Take 1 tablet (81 mg total) by mouth daily.   atorvastatin (LIPITOR) 40 MG tablet Take 1 tablet (40 mg total) by mouth every evening.   carvedilol (COREG) 12.5 MG tablet Take 1 tablet (12.5 mg total) by mouth 2 (two) times daily with a meal.   clopidogrel (PLAVIX) 75 MG tablet Take 1 tablet by mouth once daily   Ferrous Sulfate 27 MG TABS Take 27 mg by mouth at bedtime.   hydrocortisone 2.5 % cream Apply 1 application topically 2 (two) times daily as needed.   isosorbide mononitrate (IMDUR) 30 MG 24 hr tablet Take 1 tablet (30 mg total) by mouth daily.   levothyroxine (SYNTHROID) 137 MCG tablet Take 1 tablet (137 mcg total) by mouth every evening.   Melatonin 10 MG TABS Take 10 mg by mouth at bedtime.   mirtazapine (REMERON) 30 MG tablet Take 1 tablet (30 mg total) by mouth at bedtime.   Multiple Vitamin (MULTIVITAMIN) capsule Take 1 capsule by mouth every evening.   pantoprazole (PROTONIX) 40 MG tablet Take 1 tablet by mouth once daily   sacubitril-valsartan (ENTRESTO) 49-51 MG Take 1 tablet by mouth 2 (two) times daily.   sertraline (ZOLOFT) 25 MG tablet Take 1 tablet (25 mg total) by mouth daily.   torsemide (DEMADEX) 20 MG  tablet Take 1 tablet by mouth once daily   zolpidem (AMBIEN CR) 6.25 MG CR tablet Take 1 tablet (6.25 mg total) by mouth at bedtime as needed for sleep.   No facility-administered medications prior to visit.    Review of Systems      Objective    BP 133/65 (BP Location: Right Arm, Patient Position: Sitting, Cuff Size: Large)   Pulse 86   Temp 98.2 F (36.8 C) (Oral)   Resp 18   Ht '5\' 2"'$  (1.575 m)   Wt 164 lb (74.4  kg)   SpO2 95%   BMI 30.00 kg/m  BP Readings from Last 3 Encounters:  06/15/21 133/65  04/13/21 122/67  04/09/21 112/60   Wt Readings from Last 3 Encounters:  06/15/21 164 lb (74.4 kg)  04/13/21 165 lb (74.8 kg)  04/09/21 162 lb (73.5 kg)      Physical Exam Vitals reviewed.  Constitutional:      Appearance: Normal appearance.  HENT:     Right Ear: External ear normal.     Left Ear: External ear normal.     Nose: Nose normal.  Eyes:     General: No scleral icterus.    Conjunctiva/sclera: Conjunctivae normal.  Cardiovascular:     Rate and Rhythm: Normal rate and regular rhythm.     Heart sounds: Normal heart sounds.  Pulmonary:     Effort: Pulmonary effort is normal.     Breath sounds: Normal breath sounds.  Abdominal:     Palpations: Abdomen is soft.     Tenderness: There is no abdominal tenderness.  Lymphadenopathy:     Cervical: No cervical adenopathy.  Skin:    General: Skin is warm and dry.     Comments: Fair skin.  Neurological:     General: No focal deficit present.     Mental Status: She is alert and oriented to person, place, and time.  Psychiatric:        Mood and Affect: Mood normal.        Behavior: Behavior normal.        Thought Content: Thought content normal.        Judgment: Judgment normal.      No results found for any visits on 06/15/21.  Assessment & Plan     1. Acute on chronic combined systolic and diastolic CHF (congestive heart failure) (HCC) Clinically stable on Entresto p.o. other medications for her blood pressure.  She weighs daily.  She is tolerating the Entresto well at this time.  2. Pneumonia due to COVID-19 virus Patient declines follow-up chest x-ray.  She did not have a lobar pneumonia.   3. Need for influenza vaccination  - Flu Vaccine QUAD High Dose(Fluad)  4. TIA (transient ischemic attack) Risk factors treated, this is completely resolved  5. Benign essential HTN Good control on amlodipine Coreg  6. Stage 3a  chronic kidney disease (HCC) On amlodipine and Coreg for blood pressure control.  Also on Entresto.  7. Recurrent major depressive disorder, in partial remission ([place]) On sertraline at low-dose.  She appears to be in good spirits today.  8. Primary insomnia Like to see her off the Ambien completely  9. COVID-19 virus infection   10. 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 All risk factors treated.  Patient is on Plavix  11. Ankle edema, bilateral      Return in about 4 months (around 10/15/2021).      I, [name], MD, have reviewed all  documentation for this visit. The documentation on 06/18/21 for the exam, diagnosis, procedures, and orders are all accurate and complete.    Shakia Sebastiano Cranford Mon, MD  [facility] [phone] (phone) [phone] (fax)  [place]

## 2021-06-16 DIAGNOSIS — M549 Dorsalgia, unspecified: Secondary | ICD-10-CM | POA: Diagnosis not present

## 2021-06-16 DIAGNOSIS — G9520 Unspecified cord compression: Secondary | ICD-10-CM | POA: Diagnosis not present

## 2021-06-16 DIAGNOSIS — I440 Atrioventricular block, first degree: Secondary | ICD-10-CM | POA: Diagnosis not present

## 2021-06-16 DIAGNOSIS — M546 Pain in thoracic spine: Secondary | ICD-10-CM | POA: Diagnosis not present

## 2021-06-16 DIAGNOSIS — I447 Left bundle-branch block, unspecified: Secondary | ICD-10-CM | POA: Diagnosis not present

## 2021-06-16 DIAGNOSIS — M40204 Unspecified kyphosis, thoracic region: Secondary | ICD-10-CM | POA: Diagnosis not present

## 2021-06-16 DIAGNOSIS — I10 Essential (primary) hypertension: Secondary | ICD-10-CM | POA: Diagnosis not present

## 2021-06-16 DIAGNOSIS — Z79899 Other long term (current) drug therapy: Secondary | ICD-10-CM | POA: Diagnosis not present

## 2021-06-16 DIAGNOSIS — Z8673 Personal history of transient ischemic attack (TIA), and cerebral infarction without residual deficits: Secondary | ICD-10-CM | POA: Diagnosis not present

## 2021-06-16 NOTE — Telephone Encounter (Signed)
Please review. Ok to refill?  

## 2021-06-17 ENCOUNTER — Other Ambulatory Visit: Payer: Self-pay | Admitting: Family Medicine

## 2021-06-19 DIAGNOSIS — M48061 Spinal stenosis, lumbar region without neurogenic claudication: Secondary | ICD-10-CM | POA: Diagnosis not present

## 2021-06-19 DIAGNOSIS — N281 Cyst of kidney, acquired: Secondary | ICD-10-CM | POA: Diagnosis not present

## 2021-06-19 DIAGNOSIS — M47816 Spondylosis without myelopathy or radiculopathy, lumbar region: Secondary | ICD-10-CM | POA: Diagnosis not present

## 2021-06-19 DIAGNOSIS — M461 Sacroiliitis, not elsewhere classified: Secondary | ICD-10-CM | POA: Diagnosis not present

## 2021-06-19 DIAGNOSIS — M47817 Spondylosis without myelopathy or radiculopathy, lumbosacral region: Secondary | ICD-10-CM | POA: Diagnosis not present

## 2021-06-19 DIAGNOSIS — M519 Unspecified thoracic, thoracolumbar and lumbosacral intervertebral disc disorder: Secondary | ICD-10-CM | POA: Diagnosis not present

## 2021-06-19 DIAGNOSIS — M4319 Spondylolisthesis, multiple sites in spine: Secondary | ICD-10-CM | POA: Diagnosis not present

## 2021-06-19 DIAGNOSIS — M4807 Spinal stenosis, lumbosacral region: Secondary | ICD-10-CM | POA: Diagnosis not present

## 2021-06-19 DIAGNOSIS — M419 Scoliosis, unspecified: Secondary | ICD-10-CM | POA: Diagnosis not present

## 2021-06-19 DIAGNOSIS — M47819 Spondylosis without myelopathy or radiculopathy, site unspecified: Secondary | ICD-10-CM | POA: Diagnosis not present

## 2021-06-25 DIAGNOSIS — M4316 Spondylolisthesis, lumbar region: Secondary | ICD-10-CM | POA: Diagnosis not present

## 2021-06-25 DIAGNOSIS — M5136 Other intervertebral disc degeneration, lumbar region: Secondary | ICD-10-CM | POA: Diagnosis not present

## 2021-07-06 ENCOUNTER — Telehealth: Payer: Self-pay | Admitting: Family Medicine

## 2021-07-06 DIAGNOSIS — I10 Essential (primary) hypertension: Secondary | ICD-10-CM

## 2021-07-07 NOTE — Telephone Encounter (Signed)
Requested medication (s) are due for refill today: Yes  Requested medication (s) are on the active medication list: Yes  Last refill:  03/13/21  Future visit scheduled: Yes  Notes to clinic:  Last filled by Dr. Mal Misty.    Requested Prescriptions  Pending Prescriptions Disp Refills   carvedilol (COREG) 12.5 MG tablet [Pharmacy Med Name: Carvedilol 12.5 MG Oral Tablet] 60 tablet 0    Sig: Take 1 tablet by mouth twice daily     Cardiovascular:  Beta Blockers Passed - 07/06/2021  3:52 PM      Passed - Last BP in normal range    BP Readings from Last 1 Encounters:  06/15/21 133/65          Passed - Last Heart Rate in normal range    Pulse Readings from Last 1 Encounters:  06/15/21 86          Passed - Valid encounter within last 6 months    Recent Outpatient Visits           3 weeks ago Acute on chronic combined systolic and diastolic CHF (congestive heart failure) [facility])   [facility] Jerrol Banana., MD   2 months ago Chronic combined systolic and diastolic CHF (congestive heart failure) [facility])   [facility] Jerrol Banana., MD   10 months ago Benign essential HTN   [facility] Jerrol Banana., MD   1 year ago Benign essential HTN   [facility] Jerrol Banana., MD   1 year ago LLQ abdominal pain   [facility] Trinna Post, [place]       Future Appointments             In 3 months Jerrol Banana., MD [facility] [place]

## 2021-07-08 ENCOUNTER — Other Ambulatory Visit: Payer: Self-pay | Admitting: Family Medicine

## 2021-07-08 DIAGNOSIS — I10 Essential (primary) hypertension: Secondary | ICD-10-CM

## 2021-07-08 NOTE — Telephone Encounter (Signed)
Requested medication (s) are due for refill today: yes  Requested medication (s) are on the active medication list: yes  Last refill:  03/13/21  Future visit scheduled: yes in 3 months  Notes to clinic:  do you want to renew order? Last ordered by different provider B. Mal Misty, MD. Ordered from Dr. Rosanna Randy prior to 03/13/21. Class : print noted .      Requested Prescriptions  Pending Prescriptions Disp Refills   carvedilol (COREG) 12.5 MG tablet [Pharmacy Med Name: Carvedilol 12.5 MG Oral Tablet] 60 tablet 0    Sig: Take 1 tablet by mouth twice daily     Cardiovascular:  Beta Blockers Passed - 07/08/2021 10:47 AM      Passed - Last BP in normal range    BP Readings from Last 1 Encounters:  06/15/21 133/65          Passed - Last Heart Rate in normal range    Pulse Readings from Last 1 Encounters:  06/15/21 86          Passed - Valid encounter within last 6 months    Recent Outpatient Visits           3 weeks ago Acute on chronic combined systolic and diastolic CHF (congestive heart failure) [facility])   [facility] Jerrol Banana., MD   2 months ago Chronic combined systolic and diastolic CHF (congestive heart failure) [facility])   [facility] Jerrol Banana., MD   10 months ago Benign essential HTN   [facility] Jerrol Banana., MD   1 year ago Benign essential HTN   [facility] Jerrol Banana., MD   1 year ago LLQ abdominal pain   [facility] Trinna Post, [place]       Future Appointments             In 3 months Jerrol Banana., MD [facility], [place]

## 2021-07-08 NOTE — Telephone Encounter (Signed)
Please advise coreg? Rx appears to have been discontinued.

## 2021-07-08 NOTE — Telephone Encounter (Signed)
Patient called in to inform dr Rosanna Randy that she went to the Pharmacy and her medication was not there please advise

## 2021-07-09 MED ORDER — CARVEDILOL 12.5 MG PO TABS: 12.5000 mg | ORAL_TABLET | Freq: Two times a day (BID) | ORAL | 1 refills | Status: DC

## 2021-07-09 NOTE — Addendum Note (Signed)
Addended by: Ashley Royalty E on: 07/09/2021 08:32 AM   Modules accepted: Orders

## 2021-07-14 ENCOUNTER — Telehealth: Payer: Self-pay

## 2021-07-14 NOTE — Telephone Encounter (Signed)
Copied from [place] [id number]. Topic: Quick Communication - Rx Refill/Question >> Jul 14, 2021 12:30 PM Pawlus, Brayton Layman A wrote: Pt wanted to know if Dr Rosanna Randy will take over perscribing sertraline (ZOLOFT) 25 MG tablet and mirtazapine (REMERON) 30 MG tablet, Rx was perscribed by Max Sane, MD. Pt wants Dr Rosanna Randy to take over all her medications if possible. Pt requested a call back.

## 2021-07-15 ENCOUNTER — Other Ambulatory Visit: Payer: Self-pay

## 2021-07-15 DIAGNOSIS — F3341 Major depressive disorder, recurrent, in partial remission: Secondary | ICD-10-CM

## 2021-07-15 DIAGNOSIS — F3289 Other specified depressive episodes: Secondary | ICD-10-CM

## 2021-07-15 MED ORDER — SERTRALINE HCL 25 MG PO TABS: 25.0000 mg | ORAL_TABLET | Freq: Every day | ORAL | 0 refills | Status: DC

## 2021-07-15 MED ORDER — MIRTAZAPINE 30 MG PO TABS: 30.0000 mg | ORAL_TABLET | Freq: Every day | ORAL | 0 refills | Status: DC

## 2021-07-15 NOTE — Telephone Encounter (Signed)
Pt called back to report that she has been out of her depression medication since 07/10/2021, and that she is almost out of the mirtazapine.

## 2021-07-16 ENCOUNTER — Other Ambulatory Visit: Payer: Self-pay

## 2021-07-16 DIAGNOSIS — F3341 Major depressive disorder, recurrent, in partial remission: Secondary | ICD-10-CM

## 2021-07-16 DIAGNOSIS — F3289 Other specified depressive episodes: Secondary | ICD-10-CM

## 2021-07-16 NOTE — Telephone Encounter (Signed)
Please see telephone encounter 07/14/2021.  Dr. Rosanna Randy okay'd to take over pt's depression medications.   Thanks,   -Mickel Baas

## 2021-07-17 MED ORDER — MIRTAZAPINE 30 MG PO TABS: 30.0000 mg | ORAL_TABLET | Freq: Every day | ORAL | 0 refills | Status: DC

## 2021-07-17 MED ORDER — SERTRALINE HCL 25 MG PO TABS: 25.0000 mg | ORAL_TABLET | Freq: Every day | ORAL | 0 refills | Status: DC

## 2021-07-21 ENCOUNTER — Ambulatory Visit: Payer: Self-pay

## 2021-07-21 ENCOUNTER — Telehealth: Payer: Self-pay

## 2021-07-21 NOTE — Telephone Encounter (Signed)
Copied from [place] [phone] [id number]. Topic: General - Other >> Jul 21, 2021 11:18 AM Tessa Lerner A wrote: Reason for CRM: The patient would like to be contacted by a member of administrative staff about a "personal" issue when possible  Please contact further when available

## 2021-07-21 NOTE — Telephone Encounter (Signed)
Pt c/o 1 month of  moderate watery diarrhea. Pt c/o  mid abdomen mild cramping pain that comes and goes.  Pt is concerned about the color of the diarrhea. She describes it as dark brown to black. Denies any visible blood. Pt stated that she is tolerating fluids and food and voided shortly prior to triage call.  Pt very frustrated because she cannot get appt with Dr. Rosanna Randy. NT called office and spoke to [place] who also stated the schedule is full. Pt refused to go to UC or ED.  Other complaint she stated was that she only got 30 day fills with zero refills. Pt stated that she needs that changed. Please send to mail order pharmacy. Care advice given and pt verbalized understanding.  Routing note to office.      Reason for Disposition  [1] MODERATE diarrhea (e.g., 4-6 times / day more than normal) AND [2] age > 70 years  Answer Assessment - Initial Assessment Questions 1. DIARRHEA SEVERITY: "How bad is the diarrhea?" "How many more stools have you had in the past 24 hours than normal?"    - NO DIARRHEA (SCALE 0)   - MILD (SCALE 1-3): Few loose or mushy BMs; increase of 1-3 stools over normal daily number of stools; mild increase in ostomy output.   -  MODERATE (SCALE 4-7): Increase of 4-6 stools daily over normal; moderate increase in ostomy output. * SEVERE (SCALE 8-10; OR 'WORST POSSIBLE'): Increase of 7 or more stools daily over normal; moderate increase in ostomy output; incontinence.     Watery-moderate 2. ONSET: "When did the diarrhea begin?"      1 month 3. BM CONSISTENCY: "How loose or watery is the diarrhea?"      watery 4. VOMITING: "Are you also vomiting?" If Yes, ask: "How many times in the past 24 hours?"      no 5. ABDOMINAL PAIN: "Are you having any abdominal pain?" If Yes, ask: "What does it feel like?" (e.g., crampy, dull, intermittent, constant)      Yes-started last night comes and goes-mid abdomen-crampy 6. ABDOMINAL PAIN SEVERITY: If present, ask: "How bad is the  pain?"  (e.g., Scale 1-10; mild, moderate, or severe)   - MILD (1-3): doesn't interfere with normal activities, abdomen soft and not tender to touch    - MODERATE (4-7): interferes with normal activities or awakens from sleep, abdomen tender to touch    - SEVERE (8-10): excruciating pain, doubled over, unable to do any normal activities       mild 7. ORAL INTAKE: If vomiting, "Have you been able to drink liquids?" "How much liquids have you had in the past 24 hours?"     no 8. HYDRATION: "Any signs of dehydration?" (e.g., dry mouth [not just dry lips], too weak to stand, dizziness, new weight loss) "When did you last urinate?"     No-30 minutes ago 9. EXPOSURE: "Have you traveled to a foreign country recently?" "Have you been exposed to anyone with diarrhea?" "Could you have eaten any food that was spoiled?"     No-no-no 10. ANTIBIOTIC USE: "Are you taking antibiotics now or have you taken antibiotics in the past 2 months?"       no 11. OTHER SYMPTOMS: "Do you have any other symptoms?" (e.g., fever, blood in stool)       Stools are dark, takes iron pills but stools were never dark brownish black 12. PREGNANCY: "Is there any chance you are pregnant?" "When was your last menstrual  period?"       N/a  Protocols used: [facility]

## 2021-07-21 NOTE — Telephone Encounter (Signed)
Please advise 

## 2021-07-22 ENCOUNTER — Ambulatory Visit (INDEPENDENT_AMBULATORY_CARE_PROVIDER_SITE_OTHER): Payer: Medicare Other | Admitting: Family Medicine

## 2021-07-22 ENCOUNTER — Other Ambulatory Visit: Payer: Self-pay

## 2021-07-22 VITALS — BP 151/86 | HR 85 | Temp 97.9°F | Wt 161.0 lb

## 2021-07-22 DIAGNOSIS — R197 Diarrhea, unspecified: Secondary | ICD-10-CM

## 2021-07-22 DIAGNOSIS — I872 Venous insufficiency (chronic) (peripheral): Secondary | ICD-10-CM

## 2021-07-22 DIAGNOSIS — Z8673 Personal history of transient ischemic attack (TIA), and cerebral infarction without residual deficits: Secondary | ICD-10-CM

## 2021-07-22 DIAGNOSIS — I25118 Atherosclerotic heart disease of native coronary artery with other forms of angina pectoris: Secondary | ICD-10-CM | POA: Diagnosis not present

## 2021-07-22 DIAGNOSIS — N1831 Chronic kidney disease, stage 3a: Secondary | ICD-10-CM

## 2021-07-22 DIAGNOSIS — E782 Mixed hyperlipidemia: Secondary | ICD-10-CM

## 2021-07-22 DIAGNOSIS — D500 Iron deficiency anemia secondary to blood loss (chronic): Secondary | ICD-10-CM

## 2021-07-22 NOTE — Telephone Encounter (Signed)
Patient is scheduled for this afternoon.

## 2021-07-22 NOTE — Patient Instructions (Signed)
Avoid dairy products Take Metamucil daily Take over the counter probiotic

## 2021-07-22 NOTE — Progress Notes (Signed)
Established patient visit   Patient: Stephanie Charles   DOB: April 26, 1930   87 y.o. Female  MRN: [id number] Visit Date: 07/22/2021  Today's healthcare provider: [name], MD   No chief complaint on file.  Subjective    Patient is a 87 year old female who presents with complaint of diarrhea for 1 month.  She said it comes and goes but when she has it she will have up to 5 times per day of watery stool.  She states that this week the watery stool has been dark in color but is usually mainly clear.  She does ask today if milk or dairy products could be causing this as she did not have any milk yesterday or today and she said she was a little better.  She denies abdominal pain or bloating but did state that yesterday she did have some gas. Overall she is feeling better.   Medications: Outpatient Medications Prior to Visit  Medication Sig   albuterol (PROVENTIL) (2.5 MG/3ML) 0.083% nebulizer solution USE 1 VIAL IN NEBULIZER EVERY 4 HOURS AS NEEDED   allopurinol (ZYLOPRIM) 300 MG tablet Take 1 tablet by mouth once daily   amitriptyline (ELAVIL) 10 MG tablet TAKE 1 TABLET BY MOUTH AT BEDTIME   amLODipine (NORVASC) 10 MG tablet Take 1 tablet (10 mg total) by mouth daily.   aspirin EC 81 MG EC tablet Take 1 tablet (81 mg total) by mouth daily.   atorvastatin (LIPITOR) 40 MG tablet Take 1 tablet (40 mg total) by mouth every evening.   carvedilol (COREG) 12.5 MG tablet Take 1 tablet by mouth twice daily   carvedilol (COREG) 12.5 MG tablet Take 1 tablet (12.5 mg total) by mouth 2 (two) times daily with a meal.   clopidogrel (PLAVIX) 75 MG tablet Take 1 tablet by mouth once daily   Ferrous Sulfate 27 MG TABS Take 27 mg by mouth at bedtime.   hydrocortisone 2.5 % cream Apply 1 application topically 2 (two) times daily as needed.   isosorbide mononitrate (IMDUR) 30 MG 24 hr tablet Take 1 tablet (30 mg total) by mouth daily.   levothyroxine (SYNTHROID) 137 MCG tablet Take 1 tablet (137  mcg total) by mouth every evening.   Melatonin 10 MG TABS Take 10 mg by mouth at bedtime.   mirtazapine (REMERON) 30 MG tablet Take 1 tablet (30 mg total) by mouth at bedtime.   Multiple Vitamin (MULTIVITAMIN) capsule Take 1 capsule by mouth every evening.   pantoprazole (PROTONIX) 40 MG tablet Take 1 tablet by mouth once daily   sacubitril-valsartan (ENTRESTO) 49-51 MG Take 1 tablet by mouth 2 (two) times daily.   sertraline (ZOLOFT) 25 MG tablet Take 1 tablet (25 mg total) by mouth daily.   torsemide (DEMADEX) 20 MG tablet Take 1 tablet by mouth once daily   zolpidem (AMBIEN CR) 6.25 MG CR tablet Take 1 tablet (6.25 mg total) by mouth at bedtime as needed for sleep.   No facility-administered medications prior to visit.    Review of Systems  Gastrointestinal:  Positive for diarrhea. Negative for abdominal distention, abdominal pain, anal bleeding, blood in stool, constipation, nausea, rectal pain and vomiting.       Objective    BP (!) 151/86 (BP Location: Right Arm, Patient Position: Sitting, Cuff Size: Normal)   Pulse 85   Temp 97.9 F (36.6 C) (Oral)   Wt 161 lb (73 kg)   SpO2 94%   BMI 29.45 kg/m  BP Readings from Last 3 Encounters:  07/22/21 (!) 151/86  06/15/21 133/65  04/13/21 122/67   Wt Readings from Last 3 Encounters:  07/22/21 161 lb (73 kg)  06/15/21 164 lb (74.4 kg)  04/13/21 165 lb (74.8 kg)      Physical Exam Vitals reviewed.  Constitutional:      Appearance: Normal appearance.  HENT:     Right Ear: External ear normal.     Left Ear: External ear normal.     Nose: Nose normal.  Eyes:     General: No scleral icterus.    Conjunctiva/sclera: Conjunctivae normal.  Cardiovascular:     Rate and Rhythm: Normal rate and regular rhythm.     Heart sounds: Normal heart sounds.  Pulmonary:     Effort: Pulmonary effort is normal.     Breath sounds: Normal breath sounds.  Abdominal:     General: Bowel sounds are normal.     Palpations: Abdomen is soft.      Tenderness: There is no abdominal tenderness.  Lymphadenopathy:     Cervical: No cervical adenopathy.  Skin:    General: Skin is warm and dry.     Comments: Fair skin.  Neurological:     General: No focal deficit present.     Mental Status: She is alert and oriented to person, place, and time.  Psychiatric:        Mood and Affect: Mood normal.        Behavior: Behavior normal.        Thought Content: Thought content normal.        Judgment: Judgment normal.      No results found for any visits on 07/22/21.  Assessment & Plan     1. Diarrhea, unspecified type Patient has city water and has been on no antibiotics.  I think this is a self-limiting problem.  At this time we will have her avoid dairy products yet Metamucil every morning and try an over-the-counter probiotic each afternoon.  Obtain lab work. - Comprehensive metabolic panel - CBC with Differential/Platelet  2. Coronary artery disease of native artery of native heart with stable angina pectoris ([place]) All risk factors treated  3. Chronic venous insufficiency   4. Stage 3a chronic kidney disease ([place]) Mitzie ordered  5. Mixed hyperlipidemia On atorvastatin 40  6. Iron deficiency anemia due to chronic blood loss Follow-up CBC  7. 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    No follow-ups on file.      I, [name], MD, have reviewed all documentation for this visit. The documentation on 07/28/21 for the exam, diagnosis, procedures, and orders are all accurate and complete.    Kim Lauver Cranford Mon, MD  [facility] [phone] (phone) [phone] (fax)  [place]

## 2021-07-23 LAB — CBC WITH DIFFERENTIAL/PLATELET
Basophils Absolute: 0.1 10*3/uL (ref 0.0–0.2)
Basos: 1 %
EOS (ABSOLUTE): 0.2 10*3/uL (ref 0.0–0.4)
Eos: 2 %
Hematocrit: 37.4 % (ref 34.0–46.6)
Hemoglobin: 12.5 g/dL (ref 11.1–15.9)
Immature Grans (Abs): 0 10*3/uL (ref 0.0–0.1)
Immature Granulocytes: 0 %
Lymphocytes Absolute: 2 10*3/uL (ref 0.7–3.1)
Lymphs: 22 %
MCH: 31.3 pg (ref 26.6–33.0)
MCHC: 33.4 g/dL (ref 31.5–35.7)
MCV: 94 fL (ref 79–97)
Monocytes Absolute: 0.5 10*3/uL (ref 0.1–0.9)
Monocytes: 6 %
Neutrophils Absolute: 6.2 10*3/uL (ref 1.4–7.0)
Neutrophils: 69 %
Platelets: 159 10*3/uL (ref 150–450)
RBC: 3.99 x10E6/uL (ref 3.77–5.28)
RDW: 13.6 % (ref 11.7–15.4)
WBC: 8.9 10*3/uL (ref 3.4–10.8)

## 2021-07-23 LAB — COMPREHENSIVE METABOLIC PANEL
ALT: 12 IU/L (ref 0–32)
AST: 17 IU/L (ref 0–40)
Albumin/Globulin Ratio: 2.5 — ABNORMAL HIGH (ref 1.2–2.2)
Albumin: 4.7 g/dL — ABNORMAL HIGH (ref 3.6–4.6)
Alkaline Phosphatase: 117 IU/L (ref 44–121)
BUN/Creatinine Ratio: 23 (ref 12–28)
BUN: 33 mg/dL — ABNORMAL HIGH (ref 8–27)
Bilirubin Total: 0.5 mg/dL (ref 0.0–1.2)
CO2: 24 mmol/L (ref 20–29)
Calcium: 9.7 mg/dL (ref 8.7–10.3)
Chloride: 102 mmol/L (ref 96–106)
Creatinine, Ser: 1.42 mg/dL — ABNORMAL HIGH (ref 0.57–1.00)
Globulin, Total: 1.9 g/dL (ref 1.5–4.5)
Glucose: 122 mg/dL — ABNORMAL HIGH (ref 70–99)
Potassium: 4 mmol/L (ref 3.5–5.2)
Sodium: 144 mmol/L (ref 134–144)
Total Protein: 6.6 g/dL (ref 6.0–8.5)
eGFR: 36 mL/min/{1.73_m2} — ABNORMAL LOW (ref >59)

## 2021-07-28 ENCOUNTER — Telehealth: Payer: Self-pay

## 2021-07-28 NOTE — Telephone Encounter (Signed)
Patient is new to this pharmacy. And they are just wanting to make sure that PCP is aware of interactions with Plavix and pantoprazole. Also Mirtazapine and Sertraline may cause serotonin syndrome.    I did call patient to verify that she contacted this new pharmacy and she reports she did. She states this new pharmacy will pre package her medications and will make it easier for her to remember taking the medications. Patient reports we still send prescriptions to [facility] until further notice.

## 2021-07-28 NOTE — Telephone Encounter (Signed)
Eyvonne Mechanic at [facility] advised as below.

## 2021-07-29 ENCOUNTER — Other Ambulatory Visit: Payer: Self-pay | Admitting: Family Medicine

## 2021-07-29 DIAGNOSIS — Z8673 Personal history of transient ischemic attack (TIA), and cerebral infarction without residual deficits: Secondary | ICD-10-CM

## 2021-07-29 DIAGNOSIS — I5043 Acute on chronic combined systolic (congestive) and diastolic (congestive) heart failure: Secondary | ICD-10-CM

## 2021-07-29 DIAGNOSIS — K219 Gastro-esophageal reflux disease without esophagitis: Secondary | ICD-10-CM

## 2021-07-29 DIAGNOSIS — F3289 Other specified depressive episodes: Secondary | ICD-10-CM

## 2021-07-29 DIAGNOSIS — F3341 Major depressive disorder, recurrent, in partial remission: Secondary | ICD-10-CM

## 2021-07-29 DIAGNOSIS — I10 Essential (primary) hypertension: Secondary | ICD-10-CM

## 2021-07-30 ENCOUNTER — Other Ambulatory Visit: Payer: Self-pay | Admitting: Family Medicine

## 2021-07-30 NOTE — Telephone Encounter (Signed)
Requested medication (s) are due for refill today Yes  Requested medication (s) are on the active medication list Yes  Future visit scheduled Yes 10/15/21  All medications being requested to send to new mail order pharmacy.  Note to clinic-Allopurinol prescription has expired. Need clarification on Coreg dosage before routing to new pharmacy. Listed are medications prescribed by other or no signature provided at all. Routing to clinic for review. Aspirin  Atorvastatin  Melatonin MVI Entresto Ambien     Requested Prescriptions  Pending Prescriptions Disp Refills   allopurinol (ZYLOPRIM) 300 MG tablet [Pharmacy Med Name: allopurinol 300 mg tablet] 90 tablet 1    Sig: NEW PRESCRIPTION REQUEST: TAKE ONE TABLET BY MOUTH EVERY DAY     Endocrinology:  Gout Agents Failed - 07/29/2021  3:57 PM      Failed - Uric Acid in normal range and within 360 days    Uric Acid  Date Value Ref Range Status  05/19/2020 4.9 3.1 - 7.9 mg/dL Final    Comment:               Therapeutic target for gout patients: <6.0          Failed - Cr in normal range and within 360 days    Creatinine  Date Value Ref Range Status  04/26/2014 1.20 0.60 - 1.30 mg/dL Final   Creatinine, Ser  Date Value Ref Range Status  07/22/2021 1.42 (H) 0.57 - 1.00 mg/dL Final          Passed - Valid encounter within last 12 months    Recent Outpatient Visits           1 week ago Diarrhea, unspecified type   [facility] Jerrol Banana., [facility]   1 month ago Acute on chronic combined systolic and diastolic CHF (congestive heart failure) [facility])   [facility] Jerrol Banana., MD   3 months ago Chronic combined systolic and diastolic CHF (congestive heart failure) [facility])   [facility]., MD   11 months ago Benign essential HTN   [facility] Jerrol Banana., MD   1 year ago Benign essential HTN   [facility]  Jerrol Banana., MD       Future Appointments             In 2 months Jerrol Banana., MD [facility]             ASPIRIN LOW DOSE 60 MG EC tablet [Pharmacy Med Name: aspirin 81 mg tablet,delayed release] 90 tablet 3    Sig: NEW PRESCRIPTION REQUEST: TAKE ONE TABLET BY MOUTH EVERY DAY     Analgesics:  NSAIDS - aspirin Passed - 07/29/2021  3:57 PM      Passed - Patient is not pregnant      Passed - Valid encounter within last 12 months    Recent Outpatient Visits           1 week ago Diarrhea, unspecified type   [facility] Jerrol Ban[facility]., MD   1 month ago Acute on chronic combined systolic and diastolic CHF (congestive heart failure) [facility])   [facility] Jer[facility] Ban[facility]., MD   3 months ago Chronic combined systolic and diastolic CHF (congestive heart failure) [facility])   [facility] Jerrol Banana., MD   11 months ago Benign essential HTN   [facility] Jerrol Ban[facility]., [facility]   1 year ago Benign essential HTN   [facility] Jerrol Banana., MD       Future Appointments             In 2 months Jerrol Banana., MD [facility]             atorvastatin (LIPITOR) 40 MG tablet [facility] Med Name: atorvastatin 40 mg tablet] 90 tablet 3    Sig: NEW PRESCRIPTION REQUEST: TAKE ONE TABLET BY MOUTH EVERY DAY     Cardiovascular:  Antilipid - Statins Failed - 07/29/2021  3:57 PM      Failed - HDL in normal range and within 360 days    HDL Cholesterol  Date Value Ref Range Status  10/19/2011 15 (L) 40 - 60 mg/dL Final   HDL  Date Value Ref Range Status  03/07/2021 33 (L) >40 mg/dL Final  03/13/2020 29 (L) >39 mg/dL Final          Passed - Total Cholesterol in normal range and within 360 days    Cholesterol, Total  Date Value Ref Range Status  03/13/2020 115 100 - 199 mg/dL Final   Cholesterol  Date  Value Ref Range Status  03/07/2021 118 0 - 200 mg/dL Final  10/19/2011 120 0 - 200 mg/dL Final          Passed - LDL in normal range and within 360 days    Ldl Cholesterol, Calc  Date Value Ref Range Status  10/19/2011 75 0 - 100 mg/dL Final   LDL Chol Calc (NIH)  Date Value Ref Range Status  03/13/2020 53 0 - 99 mg/dL Final   LDL Cholesterol  Date Value Ref Range Status  03/07/2021 68 0 - 99 mg/dL Final    Comment:           Total Cholesterol/HDL:CHD Risk Coronary Heart Disease Risk Table                     Men   Women  1/2 Average Risk   3.4   3.3  Average Risk       5.0   4.4  2 X Average Risk   9.6   7.1  3 X Average Risk  23.4   11.0        Use the calculated Patient Ratio above and the CHD Risk Table to determine the patient's CHD Risk.        ATP III CLASSIFICATION (LDL):  <100     mg/dL   Optimal  100-129  mg/dL   Near or Above                    Optimal  130-159  mg/dL   Borderline  160-189  mg/dL   High  >190     mg/dL   Very High Performed at [facility], [address] [place], [place] [address]           Passed - Triglycerides in normal range and within 360 days    Triglycerides  Date Value Ref Range Status  03/07/2021 83 <150 mg/dL Final  10/19/2011 152 0 - 200 mg/dL Final          Passed - Patient is not pregnant      Passed - Valid encounter within last 12 months    Recent Outpatient Visits  1 week ago Diarrhea, unspecified type   [facility] Jerrol Banana., MD   1 month ago Acute on chronic combined systolic and diastolic CHF (congestive heart failure) [facility])   [facility] Jer[facility] Ban[facility]., MD   3 months ago Chronic combined systolic and diastolic CHF (congestive heart failure) [facility])   [facility] Jer[facility] Ban[facility]., MD   11 months ago Benign essential HTN   [facility] Jer[facility] Ban[facility]., MD   1 year ago Benign essential HTN    [facility] Jer[facility]., MD       Future Appointments             In 2 months Jerrol Banana., MD [facility]             carvedilol (COREG) 12.5 MG tablet [Pharmacy Med Name: carvedilol 12.5 mg tablet] 90 tablet 3    Sig: NEW PRESCRIPTION REQUEST: TAKE ONE TABLET BY MOUTH EVERY DAY     Cardiovascular:  Beta Blockers Failed - 07/29/2021  3:57 PM      Failed - Last BP in normal range    BP Readings from Last 1 Encounters:  07/22/21 (!) 151/86          Passed - Last Heart Rate in normal range    Pulse Readings from Last 1 Encounters:  07/22/21 85          Passed - Valid encounter within last 6 months    Recent Outpatient Visits           1 week ago Diarrhea, unspecified type   [facility] Jer[facility] Ban[facility]., MD   1 month ago Acute on chronic combined systolic and diastolic CHF (congestive heart failure) [facility])   [facility] Jerrol Banana., MD   3 months ago Chronic combined systolic and diastolic CHF (congestive heart failure) [facility])   [facility] Jer[facility] Ban[facility]., MD   11 months ago Benign essential HTN   [facility] Jerrol Banana., MD   1 year ago Benign essential HTN   [facility] Jerrol Banana., MD       Future Appointments             In 2 months Jerrol Banana., MD [facility]             levothyroxine (SYNTHROID) 137 MCG tablet [Pharmacy Med Name: levothyroxine 137 mcg tablet] 90 tablet 3    Sig: NEW PRESCRIPTION REQUEST: TAKE ONE TABLET BY MOUTH EVERY DAY IN THE MORNING     Endocrinology:  Hypothyroid Agents Failed - 07/29/2021  3:57 PM      Failed - TSH needs to be rechecked within 3 months after an abnormal result. Refill until TSH is due.      Failed - TSH in normal range and within 360 days    TSH  Date Value Ref Range Status  03/13/2021 0.052 (L) 0.350 -  4.500 uIU/mL Final    Comment:    Performed by a 3rd Generation assay with a functional sensitivity of <=0.01 uIU/mL. Performed at [facility], [address] [place], [place] [address]   05/19/2020 1.110 0.450 - 4.500 uIU/mL Final          Passed - Valid encounter within last 12 months    Recent Outpatient Visits  1 week ago Diarrhea, unspecified type   [facility] Jerrol Banana., MD   1 month ago Acute on chronic combined systolic and diastolic CHF (congestive heart failure) [facility])   [facility]   3 months ago Chronic combined systolic and diastolic CHF (congestive heart failure) [facility])   [facility] Jerrol Banana., MD   11 months ago Benign essential HTN   [facility] Jerrol Banana., MD   1 year ago Benign essential HTN   [facility] Jer[facility] Banana., MD       Future Appointments             In 2 months Jerrol Banana., MD [facility]            Signed Prescriptions Disp Refills   amLODipine (NORVASC) 10 MG tablet 90 tablet 1    Sig: NEW PRESCRIPTION REQUEST: TAKE ONE TABLET BY MOUTH EVERY DAY     Cardiovascular:  Calcium Channel Blockers Failed - 07/29/2021  3:57 PM      Failed - Last BP in normal range    BP Readings from Last 1 Encounters:  07/22/21 (!) 151/86          Passed - Valid encounter within last 6 months    Recent Outpatient Visits           1 week ago Diarrhea, unspecified type   [facility] Jerrol Banana., MD   1 month ago Acute on chronic combined systolic and diastolic CHF (congestive heart failure) [facility])   [facility]., MD   3 months ago Chronic combined systolic and diastolic CHF (congestive heart failure) [facility])   [facility] Jerrol Banana., MD   11 months ago Benign essential HTN    [facility] Jerrol Banana., MD   1 year ago Benign essential HTN   [facility] Jerrol Banana., MD       Future Appointments             In 2 months Jerrol Banana., MD [facility]             clopidogrel (PLAVIX) 75 MG tablet 90 tablet 1    Sig: NEW PRESCRIPTION REQUEST: TAKE ONE TABLET BY MOUTH EVERY DAY     Hematology: Antiplatelets - clopidogrel Failed - 07/29/2021  3:57 PM      Failed - Evaluate AST, ALT within 2 months of therapy initiation.      Passed - ALT in normal range and within 360 days    ALT  Date Value Ref Range Status  07/22/2021 12 0 - 32 IU/L Final   SGPT (ALT)  Date Value Ref Range Status  04/26/2014 29 U/L Final    Comment:    14-63 NOTE: New Reference Range 04/23/14           Passed - AST in normal range and within 360 days    AST  Date Value Ref Range Status  07/22/2021 17 0 - 40 IU/L Final   SGOT(AST)  Date Value Ref Range Status  04/26/2014 22 15 - 37 Unit/L Final          Passed - HCT in normal range and within 180 days    Hematocrit  Date Value Ref Range Status  07/22/2021 37.4 34.0 - 46.6 %  Final          Passed - HGB in normal range and within 180 days    Hemoglobin  Date Value Ref Range Status  07/22/2021 12.5 11.1 - 15.9 g/dL Final          Passed - PLT in normal range and within 180 days    Platelets  Date Value Ref Range Status  07/22/2021 159 150 - 450 x10E3/uL Final          Passed - Valid encounter within last 6 months    Recent Outpatient Visits           1 week ago Diarrhea, unspecified type   [facility] Jer[facility] Ban[facility]., MD   1 month ago Acute on chronic combined systolic and diastolic CHF (congestive heart failure) [facility])   [facility] Jer[facility] Banana[facility] MD   3 months ago Chronic combined systolic and diastolic CHF (congestive heart failure) [facility])   [facility]  Jerrol Banana., MD   11 months ago Benign essential HTN   [facility] Jerrol Ban[facility] MD   1 year ago Benign essential HTN   [facility] Jerrol Banana., MD       Future Appointments             In 2 months Jerrol Banana., MD [facility]             isosorbide dinitrate (ISORDIL) 30 MG tablet 90 tablet 1    Sig: NEW PRESCRIPTION REQUEST: TAKE ONE TABLET BY MOUTH EVERY DAY     Cardiovascular:  Nitrates Failed - 07/29/2021  3:57 PM      Failed - Last BP in normal range    BP Readings from Last 1 Encounters:  07/22/21 (!) 151/86          Passed - Last Heart Rate in normal range    Pulse Readings from Last 1 Encounters:  07/22/21 85          Passed - Valid encounter within last 12 months    Recent Outpatient Visits           1 week ago Diarrhea, unspecified type   [facility] Jer[facility] Ban[facility]., MD   1 month ago Acute on chronic combined systolic and diastolic CHF (congestive heart failure) [facility])   [facility] Banana[facility] MD   3 months ago Chronic combined systolic and diastolic CHF (congestive heart failure) [facility])   [facility] MD   11 months ago Benign essential HTN   [facility] Jer[facility] Ban[facility]., MD   1 year ago Benign essential HTN   [facility] Jerrol Banana., MD       Future Appointments             In 2 months Jerrol Banana., MD [facility]             mirtazapine (REMERON) 30 MG tablet 90 tablet 1    Sig: NEW PRESCRIPTION REQUEST: TAKE ONE TABLET BY MOUTH AT BEDTIME     Psychiatry: Antidepressants - mirtazapine Passed - 07/29/2021  3:57 PM      Passed - AST in normal range and within 360 days    AST  Date Value Ref Range Status  07/22/2021 17 0 - 40 IU/L Final   SGOT(AST)  Date Value Ref Range Status   04/26/2014 22 15 - 37 Unit/L Final          Passed - ALT in normal range and within 360 days    ALT  Date Value Ref Range Status  07/22/2021 12 0 - 32 IU/L Final   SGPT (ALT)  Date Value Ref Range Status  04/26/2014 29 U/L Final    Comment:    14-63 NOTE: New Reference Range 04/23/14           Passed - Triglycerides in normal range and within 360 days    Triglycerides  Date Value Ref Range Status  03/07/2021 83 <150 mg/dL Final  10/19/2011 152 0 - 200 mg/dL Final          Passed - Total Cholesterol in normal range and within 360 days    Cholesterol, Total  Date Value Ref Range Status  03/13/2020 115 100 - 199 mg/dL Final   Cholesterol  Date Value Ref Range Status  03/07/2021 118 0 - 200 mg/dL Final  10/19/2011 120 0 - 200 mg/dL Final          Passed - WBC in normal range and within 360 days    WBC  Date Value Ref Range Status  07/22/2021 8.9 3.4 - 10.8 x10E3/uL Final  03/13/2021 9.4 4.0 - 10.5 K/uL Final          Passed - Completed PHQ-2 or PHQ-9 in the last 360 days      Passed - Valid encounter within last 6 months    Recent Outpatient Visits           1 week ago Diarrhea, unspecified type   [facility] Jer[facility] Ban[facility]., MD   1 month ago Acute on chronic combined systolic and diastolic CHF (congestive heart failure) [facility])   [facility] Jerrol Banana., MD   3 months ago Chronic combined systolic and diastolic CHF (congestive heart failure) [facility])   [facility] Jerrol Banana., MD   11 months ago Benign essential HTN   [facility] Jer[facility] Banana., MD   1 year ago Benign essential HTN   [facility] Jerrol Banana., MD       Future Appointments             In 2 months Jerrol Banana., MD [facility]             pantoprazole (PROTONIX) 40 MG tablet 90 tablet 1    Sig: NEW PRESCRIPTION REQUEST: TAKE ONE  TABLET BY MOUTH EVERY DAY     Gastroenterology: Proton Pump Inhibitors Passed - 07/29/2021  3:57 PM      Passed - Valid encounter within last 12 months    Recent Outpatient Visits           1 week ago Diarrhea, unspecified type   [facility] Jer[facility] Ban[facility]., MD   1 month ago Acute on chronic combined systolic and diastolic CHF (congestive heart failure) [facility])   [facility] Jer[facility] Banana., MD   3 months ago Chronic combined systolic and diastolic CHF (congestive heart failure) [facility])   [facility] Jerrol Banana., MD   11 months ago Benign essential HTN   [facility] Jerrol Banana., MD   1 year ago Benign essential HTN   [facility] Jerrol Banana., MD  Future Appointments             In 2 months Jerrol Banana., MD [facility]             sertraline (ZOLOFT) 25 MG tablet 90 tablet 1    Sig: NEW PRESCRIPTION REQUEST: TAKE ONE TABLET BY MOUTH EVERY DAY     Psychiatry:  Antidepressants - SSRI Passed - 07/29/2021  3:57 PM      Passed - Completed PHQ-2 or PHQ-9 in the last 360 days      Passed - Valid encounter within last 6 months    Recent Outpatient Visits           1 week ago Diarrhea, unspecified type   [facility] Jerrol Banana., MD   1 month ago Acute on chronic combined systolic and diastolic CHF (congestive heart failure) [facility])   [facility] Jerrol Banana., MD   3 months ago Chronic combined systolic and diastolic CHF (congestive heart failure) [facility])   [facility] Jerrol Banana., MD   11 months ago Benign essential HTN   [facility] Jerrol Banana., MD   1 year ago Benign essential HTN   [facility] Jerrol Banana., MD       Future Appointments             In 2 months Jerrol Banana., MD  [facility]             torsemide (DEMADEX) 20 MG tablet 90 tablet 1    Sig: NEW PRESCRIPTION REQUEST: TAKE ONE TABLET BY MOUTH EVERY DAY     Cardiovascular:  Diuretics - Loop Failed - 07/29/2021  3:57 PM      Failed - Cr in normal range and within 360 days    Creatinine  Date Value Ref Range Status  04/26/2014 1.20 0.60 - 1.30 mg/dL Final   Creatinine, Ser  Date Value Ref Range Status  07/22/2021 1.42 (H) 0.57 - 1.00 mg/dL Final          Failed - Last BP in normal range    BP Readings from Last 1 Encounters:  07/22/21 (!) 151/86          Passed - K in normal range and within 360 days    Potassium  Date Value Ref Range Status  07/22/2021 4.0 3.5 - 5.2 mmol/L Final  04/26/2014 3.9 3.5 - 5.1 mmol/L Final          Passed - Ca in normal range and within 360 days    Calcium  Date Value Ref Range Status  07/22/2021 9.7 8.7 - 10.3 mg/dL Final   Calcium, Total  Date Value Ref Range Status  04/26/2014 8.8 8.5 - 10.1 mg/dL Final          Passed - Na in normal range and within 360 days    Sodium  Date Value Ref Range Status  07/22/2021 144 134 - 144 mmol/L Final  04/26/2014 133 (L) 136 - 145 mmol/L Final          Passed - Valid encounter within last 6 months    Recent Outpatient Visits           1 week ago Diarrhea, unspecified type   [facility] Jer[facility] Ban[facility]., [facility]   1 month ago Acute on chronic combined systolic and diastolic CHF (congestive heart failure) ([place])  [facility] Jerrol Banana., MD   3 months ago Chronic combined systolic and diastolic CHF (congestive heart failure) [facility])   [facility] Jer[facility]., MD   11 months ago Benign essential HTN   [facility] Jerrol Banana., MD   1 year ago Benign essential HTN   [facility] Jerrol Banana., MD       Future Appointments             In 2 months Jerrol Banana., MD [facility] [place]

## 2021-07-30 NOTE — Telephone Encounter (Signed)
Spoke with [facility] current prescriptions prescribed by PCP.  Requested Prescriptions  Pending Prescriptions Disp Refills  . amLODipine (NORVASC) 10 MG tablet [Pharmacy Med Name: amlodipine 10 mg tablet] 90 tablet 1    Sig: NEW PRESCRIPTION REQUEST: TAKE ONE TABLET BY MOUTH EVERY DAY     Cardiovascular:  Calcium Channel Blockers Failed - 07/29/2021  3:57 PM      Failed - Last BP in normal range    BP Readings from Last 1 Encounters:  07/22/21 (!) 151/86         Passed - Valid encounter within last 6 months    Recent Outpatient Visits          1 week ago Diarrhea, unspecified type   [facility] Jerrol Banana., MD   1 month ago Acute on chronic combined systolic and diastolic CHF (congestive heart failure) [facility])   [facility] Jerrol Ban[facility]., MD   3 months ago Chronic combined systolic and diastolic CHF (congestive heart failure) [facility])   [facility] Jerrol Banana., MD   11 months ago Benign essential HTN   [facility] MD   1 year ago Benign essential HTN   [facility] Jerrol Banana., MD      Future Appointments            In 2 months Jerrol Banana., MD [facility]           . ASPIRIN LOW DOSE 81 MG EC tablet [Pharmacy Med Name: aspirin 81 mg tablet,delayed release] 90 tablet 3    Sig: NEW PRESCRIPTION REQUEST: TAKE ONE TABLET BY MOUTH EVERY DAY     Analgesics:  NSAIDS - aspirin Passed - 07/29/2021  3:57 PM      Passed - Patient is not pregnant      Passed - Valid encounter within last 12 months    Recent Outpatient Visits          1 week ago Diarrhea, unspecified type   [facility] Jer[facility]., MD   1 month ago Acute on chronic combined systolic and diastolic CHF (congestive heart failure) [facility])   [facility] Ban[facility]., MD   3  months ago Chronic combined systolic and diastolic CHF (congestive heart failure) [facility])   [facility] Jer[facility]., MD   11 months ago Benign essential HTN   [facility] Jerrol Banana., MD   1 year ago Benign essential HTN   [facility] Jerrol Banana., MD      Future Appointments            In 2 months Jerrol Banana., MD [facility]           . atorvastatin (LIPITOR) 40 MG tablet [Pharmacy Med Name: atorvastatin 40 mg tablet] 90 tablet 3    Sig: NEW PRESCRIPTION REQUEST: TAKE ONE TABLET BY MOUTH EVERY DAY     Cardiovascular:  Antilipid - Statins Failed - 07/29/2021  3:57 PM      Failed - HDL in normal range and within 360 days    HDL Cholesterol  Date Value Ref Range Status  10/19/2011 15 (L) 40 - 60 mg/dL Final   HDL  Date Value Ref Range Status  03/07/2021 33 (L) >40 mg/dL Final  03/13/2020 29 (L) >39 mg/dL Final  Passed - Total Cholesterol in normal range and within 360 days    Cholesterol, Total  Date Value Ref Range Status  03/13/2020 115 100 - 199 mg/dL Final   Cholesterol  Date Value Ref Range Status  03/07/2021 118 0 - 200 mg/dL Final  10/19/2011 120 0 - 200 mg/dL Final         Passed - LDL in normal range and within 360 days    Ldl Cholesterol, Calc  Date Value Ref Range Status  10/19/2011 75 0 - 100 mg/dL Final   LDL Chol Calc (NIH)  Date Value Ref Range Status  03/13/2020 53 0 - 99 mg/dL Final   LDL Cholesterol  Date Value Ref Range Status  03/07/2021 68 0 - 99 mg/dL Final    Comment:           Total Cholesterol/HDL:CHD Risk Coronary Heart Disease Risk Table                     Men   Women  1/2 Average Risk   3.4   3.3  Average Risk       5.0   4.4  2 X Average Risk   9.6   7.1  3 X Average Risk  23.4   11.0        Use the calculated Patient Ratio above and the CHD Risk Table to determine the patient's CHD Risk.        ATP III  CLASSIFICATION (LDL):  <100     mg/dL   Optimal  100-129  mg/dL   Near or Above                    Optimal  130-159  mg/dL   Borderline  160-189  mg/dL   High  >190     mg/dL   Very High Performed at [facility], [place], [place] [address]          Passed - Triglycerides in normal range and within 360 days    Triglycerides  Date Value Ref Range Status  03/07/2021 83 <150 mg/dL Final  10/19/2011 152 0 - 200 mg/dL Final         Passed - Patient is not pregnant      Passed - Valid encounter within last 12 months    Recent Outpatient Visits          1 week ago Diarrhea, unspecified type   [facility] Jerrol Ban[facility] MD   1 month ago Acute on chronic combined systolic and diastolic CHF (congestive heart failure) [facility])   [facility] Ban[facility] MD   3 months ago Chronic combined systolic and diastolic CHF (congestive heart failure) [facility])   [facility] Jerrol Banana., MD   11 months ago Benign essential HTN   [facility]   1 year ago Benign essential HTN   [facility] Jerrol Banana., MD      Future Appointments            In 2 months Jerrol Banana., MD [facility]           . carvedilol (COREG) 12.5 MG tablet [Pharmacy Med Name: carvedilol 12.5 mg tablet] 90 tablet 3    Sig: NEW PRESCRIPTION REQUEST: TAKE ONE TABLET BY MOUTH EVERY DAY     Cardiovascular:  Beta Blockers  Failed - 07/29/2021  3:57 PM      Failed - Last BP in normal range    BP Readings from Last 1 Encounters:  07/22/21 (!) 151/86         Passed - Last Heart Rate in normal range    Pulse Readings from Last 1 Encounters:  07/22/21 85         Passed - Valid encounter within last 6 months    Recent Outpatient Visits          1 week ago Diarrhea, unspecified type   [facility] Jer[facility] MD   1 month ago Acute on chronic combined systolic and diastolic CHF (congestive heart failure) [facility])   [facility] Jerrol Banana., MD   3 months ago Chronic combined systolic and diastolic CHF (congestive heart failure) [facility])   [facility] Jerrol Banana., MD   11 months ago Benign essential HTN   [facility] Jer[facility] Ban[facility] MD   1 year ago Benign essential HTN   [facility] Ban[facility] MD      Future Appointments            In 2 months Jerrol Banana., MD [facility]           . clopidogrel (PLAVIX) 75 MG tablet [Pharmacy Med Name: clopidogrel 75 mg tablet] 90 tablet 1    Sig: NEW PRESCRIPTION REQUEST: TAKE ONE TABLET BY MOUTH [place] DAY     Hematology: Antiplatelets - clopidogrel Failed - 07/29/2021  3:57 PM      Failed - Evaluate AST, ALT within 2 months of therapy initiation.      Passed - ALT in normal range and within 360 days    ALT  Date Value Ref Range Status  07/22/2021 12 0 - 32 IU/L Final   SGPT (ALT)  Date Value Ref Range Status  04/26/2014 29 U/L Final    Comment:    14-63 NOTE: New Reference Range 04/23/14          Passed - AST in normal range and within 360 days    AST  Date Value Ref Range Status  07/22/2021 17 0 - 40 IU/L Final   SGOT(AST)  Date Value Ref Range Status  04/26/2014 22 15 - 37 Unit/L Final         Passed - HCT in normal range and within 180 days    Hematocrit  Date Value Ref Range Status  07/22/2021 37.4 34.0 - 46.6 % Final         Passed - HGB in normal range and within 180 days    Hemoglobin  Date Value Ref Range Status  07/22/2021 12.5 11.1 - 15.9 g/dL Final         Passed - PLT in normal range and within 180 days    Platelets  Date Value Ref Range Status  07/22/2021 159 150 - 450 x10E3/uL Final         Passed - Valid encounter within last 6 months    Recent Outpatient Visits           1 week ago Diarrhea, unspecified type   [facility] Jerrol Ban[facility]., MD   1 month ago Acute on chronic combined systolic and diastolic CHF (congestive heart failure) [facility])   [facility] Jerrol Banana., MD   3 months ago Chronic combined  systolic and diastolic CHF (congestive heart failure) [facility])   [facility] Jer[facility] Ban[facility]., MD   11 months ago Benign essential HTN   [facility]   1 year ago Benign essential HTN   [facility] Jerrol Banana., MD      Future Appointments            In 2 months Jerrol Banana., MD [facility]           . isosorbide dinitrate (ISORDIL) 30 MG tablet [Pharmacy Med Name: isosorbide dinitrate 30 mg tablet] 90 tablet 1    Sig: NEW PRESCRIPTION REQUEST: TAKE ONE TABLET BY MOUTH EVERY DAY     Cardiovascular:  Nitrates Failed - 07/29/2021  3:57 PM      Failed - Last BP in normal range    BP Readings from Last 1 Encounters:  07/22/21 (!) 151/86         Passed - Last Heart Rate in normal range    Pulse Readings from Last 1 Encounters:  07/22/21 85         Passed - Valid encounter within last 12 months    Recent Outpatient Visits          1 week ago Diarrhea, unspecified type   [facility] Jerrol Ban[facility]., MD   1 month ago Acute on chronic combined systolic and diastolic CHF (congestive heart failure) [facility])   [facility] Jerrol Banana., MD   3 months ago Chronic combined systolic and diastolic CHF (congestive heart failure) [facility])   [facility] Jer[facility] Banana., MD   11 months ago Benign essential HTN   [facility] Jerrol Banana., MD   1 year ago Benign essential HTN   [facility] Jerrol Banana., MD      Future Appointments            In 2 months Jerrol Banana., MD  [facility]           . levothyroxine (SYNTHROID) 137 MCG tablet [Pharmacy Med Name: levothyroxine 137 mcg tablet] 90 tablet 3    Sig: NEW PRESCRIPTION REQUEST: TAKE ONE TABLET BY MOUTH EVERY DAY IN THE MORNING     Endocrinology:  Hypothyroid Agents Failed - 07/29/2021  3:57 PM      Failed - TSH needs to be rechecked within 3 months after an abnormal result. Refill until TSH is due.      Failed - TSH in normal range and within 360 days    TSH  Date Value Ref Range Status  03/13/2021 0.052 (L) 0.350 - 4.500 uIU/mL Final    Comment:    Performed by a 3rd Generation assay with a functional sensitivity of <=0.01 uIU/mL. Performed at [facility], [address] [place], [place] [address]   05/19/2020 1.110 0.450 - 4.500 uIU/mL Final         Passed - Valid encounter within last 12 months    Recent Outpatient Visits          1 week ago Diarrhea, unspecified type   [facility] Jer[facility]., MD   1 month ago Acute on chronic combined systolic and diastolic CHF (congestive heart failure) [facility])   [facility]   3 months ago Chronic combined systolic and diastolic CHF (congestive  heart failure) [facility])   [facility] Jerrol Banana., MD   11 months ago Benign essential HTN   [facility] Jerrol Banana., MD   1 year ago Benign essential HTN   [facility] Jerrol Banana., MD      Future Appointments            In 2 months Jerrol Banana., MD [facility]           . mirtazapine (REMERON) 30 MG tablet [Pharmacy Med Name: mirtazapine 30 mg tablet] 90 tablet 1    Sig: NEW PRESCRIPTION REQUEST: TAKE ONE TABLET BY MOUTH AT BEDTIME     Psychiatry: Antidepressants - mirtazapine Passed - 07/29/2021  3:57 PM      Passed - AST in normal range and within 360 days    AST  Date Value Ref Range Status   07/22/2021 17 0 - 40 IU/L Final   SGOT(AST)  Date Value Ref Range Status  04/26/2014 22 15 - 37 Unit/L Final         Passed - ALT in normal range and within 360 days    ALT  Date Value Ref Range Status  07/22/2021 12 0 - 32 IU/L Final   SGPT (ALT)  Date Value Ref Range Status  04/26/2014 29 U/L Final    Comment:    14-63 NOTE: New Reference Range 04/23/14          Passed - Triglycerides in normal range and within 360 days    Triglycerides  Date Value Ref Range Status  03/07/2021 83 <150 mg/dL Final  10/19/2011 152 0 - 200 mg/dL Final         Passed - Total Cholesterol in normal range and within 360 days    Cholesterol, Total  Date Value Ref Range Status  03/13/2020 115 100 - 199 mg/dL Final   Cholesterol  Date Value Ref Range Status  03/07/2021 118 0 - 200 mg/dL Final  10/19/2011 120 0 - 200 mg/dL Final         Passed - WBC in normal range and within 360 days    WBC  Date Value Ref Range Status  07/22/2021 8.9 3.4 - 10.8 x10E3/uL Final  03/13/2021 9.4 4.0 - 10.5 K/uL Final         Passed - Completed PHQ-2 or PHQ-9 in the last 360 days      Passed - Valid encounter within last 6 months    Recent Outpatient Visits          1 week ago Diarrhea, unspecified type   [facility] Jer[facility]   1 month ago Acute on chronic combined systolic and diastolic CHF (congestive heart failure) [facility])   [facility] Jer[facility] MD   3 months ago Chronic combined systolic and diastolic CHF (congestive heart failure) [facility])   [facility] Jerrol Banana., MD   11 months ago Benign essential HTN   [facility] Jerrol Banana., MD   1 year ago Benign essential HTN   [facility] Jerrol Banana., MD      Future Appointments            In 2 months Jerrol Banana., MD [facility] [place]           . pantoprazole (PROTONIX) 40 MG tablet  [Pharmacy Med Name: pantoprazole  40 mg tablet,delayed release] 90 tablet 1    Sig: NEW PRESCRIPTION REQUEST: TAKE ONE TABLET BY MOUTH EVERY DAY     Gastroenterology: Proton Pump Inhibitors Passed - 07/29/2021  3:57 PM      Passed - Valid encounter within last 12 months    Recent Outpatient Visits          1 week ago Diarrhea, unspecified type   [facility] Jerrol Ban[facility]., MD   1 month ago Acute on chronic combined systolic and diastolic CHF (congestive heart failure) [facility])   [facility] Jerrol Banana., MD   3 months ago Chronic combined systolic and diastolic CHF (congestive heart failure) [facility])   [facility] Jerrol Banana., MD   11 months ago Benign essential HTN   [facility] Jerrol Banana., MD   1 year ago Benign essential HTN   [facility] Jerrol Banana., MD      Future Appointments            In 2 months Jerrol Banana., MD [facility]           . sertraline (ZOLOFT) 25 MG tablet [Pharmacy Med Name: sertraline 25 mg tablet] 90 tablet 1    Sig: NEW PRESCRIPTION REQUEST: TAKE ONE TABLET BY MOUTH EVERY DAY     Psychiatry:  Antidepressants - SSRI Passed - 07/29/2021  3:57 PM      Passed - Completed PHQ-2 or PHQ-9 in the last 360 days      Passed - Valid encounter within last 6 months    Recent Outpatient Visits          1 week ago Diarrhea, unspecified type   [facility]   1 month ago Acute on chronic combined systolic and diastolic CHF (congestive heart failure) [facility])   [facility] Jer[facility] Banana., MD   3 months ago Chronic combined systolic and diastolic CHF (congestive heart failure) [facility])   [facility] Jerrol Banana., MD   11 months ago Benign essential HTN   [facility] Jerrol Banana., MD   1 year ago Benign essential  HTN   [facility] Jerrol Banana., MD      Future Appointments            In 2 months Jerrol Banana., MD [facility]           . torsemide [facility]) 20 MG tablet [Pharmacy Med Name: torsemide 20 mg tablet] 90 tablet 1    Sig: NEW PRESCRIPTION REQUEST: TAKE ONE TABLET BY MOUTH EVERY DAY     Cardiovascular:  Diuretics - Loop Failed - 07/29/2021  3:57 PM      Failed - Cr in normal range and within 360 days    Creatinine  Date Value Ref Range Status  04/26/2014 1.20 0.60 - 1.30 mg/dL Final   Creatinine, Ser  Date Value Ref Range Status  07/22/2021 1.42 (H) 0.57 - 1.00 mg/dL Final         Failed - Last BP in normal range    BP Readings from Last 1 Encounters:  07/22/21 (!) 151/86         Passed - K in normal range and within 360 days    Potassium  Date Value Ref Range Status  07/22/2021 4.0 3.5 - 5.2  mmol/L Final  04/26/2014 3.9 3.5 - 5.1 mmol/L Final         Passed - Ca in normal range and within 360 days    Calcium  Date Value Ref Range Status  07/22/2021 9.7 8.7 - 10.3 mg/dL Final   Calcium, Total  Date Value Ref Range Status  04/26/2014 8.8 8.5 - 10.1 mg/dL Final         Passed - Na in normal range and within 360 days    Sodium  Date Value Ref Range Status  07/22/2021 144 134 - 144 mmol/L Final  04/26/2014 133 (L) 136 - 145 mmol/L Final         Passed - Valid encounter within last 6 months    Recent Outpatient Visits          1 week ago Diarrhea, unspecified type   [facility] Jer[facility] MD   1 month ago Acute on chronic combined systolic and diastolic CHF (congestive heart failure) [facility])   [facility] Jerrol Ban[facility]., MD   3 months ago Chronic combined systolic and diastolic CHF (congestive heart failure) [facility])   [facility] Jerrol Ban[facility]., MD   11 months ago Benign essential HTN   [facility] Jerrol Banana., MD   1 year ago Benign essential HTN   [facility] Jerrol Banana., MD      Future Appointments            In 2 months Jerrol Banana., MD [facility]           Refused Prescriptions Disp Refills  . allopurinol (ZYLOPRIM) 300 MG tablet [Pharmacy Med Name: allopurinol 300 mg tablet] 90 tablet 1    Sig: NEW PRESCRIPTION REQUEST: TAKE ONE TABLET BY MOUTH EVERY DAY     Endocrinology:  Gout Agents Failed - 07/29/2021  3:57 PM      Failed - Uric Acid in normal range and within 360 days    Uric Acid  Date Value Ref Range Status  05/19/2020 4.9 3.1 - 7.9 mg/dL Final    Comment:               Therapeutic target for gout patients: <6.0         Failed - Cr in normal range and within 360 days    Creatinine  Date Value Ref Range Status  04/26/2014 1.20 0.60 - 1.30 mg/dL Final   Creatinine, Ser  Date Value Ref Range Status  07/22/2021 1.42 (H) 0.57 - 1.00 mg/dL Final         Passed - Valid encounter within last 12 months    Recent Outpatient Visits          1 week ago Diarrhea, unspecified type   [facility] Jer[facility] Ban[facility]., MD   1 month ago Acute on chronic combined systolic and diastolic CHF (congestive heart failure) [facility])   [facility] Jerrol Banana., MD   3 months ago Chronic combined systolic and diastolic CHF (congestive heart failure) [facility])   [facility] Jerrol Banana., MD   11 months ago Benign essential HTN   [facility] Jerrol Banana., MD   1 year ago Benign essential HTN   [facility] Jerrol Banana., MD      Future Appointments  In 2 months Jerrol Banana., MD [facility] [place]

## 2021-08-17 ENCOUNTER — Telehealth: Payer: Self-pay | Admitting: Cardiovascular Disease

## 2021-08-17 NOTE — Telephone Encounter (Signed)
Patient calling  States she will need to reapply for her [employer] assistance for [facility]  Her ID is [id number] Fax number [phone] Please call to discuss

## 2021-08-17 NOTE — Telephone Encounter (Signed)
Was able to reach out to Mrs. Gerlene Fee of [employer] new requirements for PA She has application that was mailed to her, she will complete and send in to office for provider's completion.   Advised she will need to provide proof of her household's gross income.  She has two options she can choose  - [employer] [place] Reporting Act [facility]) Consent on the Patient Application for this optional service.    OR  - She can include a copy of her financial documents, which include the following: Most recent year's tax return W-2 form Three months of paycheck stubs Social Security statement [phone])  Mrs. Scheier verbalized understanding and will complete her portion and send in application for Dr. Rockey Situ to sign, then nurse will fax to [employer]. Pt thankful for the return call and will start on her application process.  PT ID # [id number]

## 2021-09-04 ENCOUNTER — Telehealth: Payer: Self-pay | Admitting: Family Medicine

## 2021-09-04 NOTE — Telephone Encounter (Signed)
Pharmacy called to get refill for amitriptyline (ELAVIL) 10 MG tablet  that was requested by the pt but stated they have a reacting med mirtazapine (REMERON) 30 MG tablet/ please advise which RX is the current medication pt should have refilled / pharmacy has refill for Mirtazapine

## 2021-09-05 NOTE — Telephone Encounter (Signed)
Routing to [facility].  Both meds are active.

## 2021-09-07 ENCOUNTER — Other Ambulatory Visit: Payer: Self-pay | Admitting: Family Medicine

## 2021-09-07 DIAGNOSIS — I10 Essential (primary) hypertension: Secondary | ICD-10-CM

## 2021-09-07 NOTE — Telephone Encounter (Signed)
Requested medication (s) are due for refill today: NO  Requested medication (s) are on the active medication list: NO, dose/signature different from current med list  Last refill:  07/09/21  Future visit scheduled: 10/15/21  Notes to clinic:   Dose/signature inconsistent with current med list, please assess.  Requested Prescriptions  Pending Prescriptions Disp Refills   carvedilol (COREG) 12.5 MG tablet [Pharmacy Med Name: Carvedilol 12.5 MG Oral Tablet] 60 tablet 0    Sig: Take 1 tablet by mouth twice daily     Cardiovascular:  Beta Blockers Failed - 09/07/2021 12:03 PM      Failed - Last BP in normal range    BP Readings from Last 1 Encounters:  07/22/21 (!) 151/86          Passed - Last Heart Rate in normal range    Pulse Readings from Last 1 Encounters:  07/22/21 85          Passed - Valid encounter within last 6 months    Recent Outpatient Visits           1 month ago Diarrhea, unspecified type   [facility] Jer[facility] Ban[facility]   2 months ago Acute on chronic combined systolic and diastolic CHF (congestive heart failure) [facility])   [facility] Jerrol Banana., MD   4 months ago Chronic combined systolic and diastolic CHF (congestive heart failure) [facility])   [facility] Jerrol Banana., MD   1 year ago Benign essential HTN   [facility] Jerrol Banana., MD   1 year ago Benign essential HTN   [facility] Jerrol Banana., MD       Future Appointments             In 1 month Jerrol Banana., MD [facility]

## 2021-09-07 NOTE — Telephone Encounter (Signed)
Patient's PCP is out of the office, but it appears from her med list that she is taking Remeron, Amitriptyline and Zoloft.  These 3 together increase risk of serotonin syndrome.  Can we touch base with her or her caregivers to see if she is taking all 3 or not. Also need to know if she is taking Ambien.

## 2021-09-07 NOTE — Telephone Encounter (Signed)
Lmtcb to go over medications. PEC please advise as below.

## 2021-09-09 ENCOUNTER — Telehealth: Payer: Self-pay | Admitting: *Deleted

## 2021-09-09 ENCOUNTER — Other Ambulatory Visit: Payer: Self-pay | Admitting: *Deleted

## 2021-09-09 ENCOUNTER — Ambulatory Visit (INDEPENDENT_AMBULATORY_CARE_PROVIDER_SITE_OTHER): Payer: Medicare Other

## 2021-09-09 ENCOUNTER — Telehealth: Payer: Self-pay

## 2021-09-09 DIAGNOSIS — Z0000 Encounter for general adult medical examination without abnormal findings: Secondary | ICD-10-CM

## 2021-09-09 MED ORDER — CARVEDILOL 12.5 MG PO TABS: 12.5000 mg | ORAL_TABLET | Freq: Two times a day (BID) | ORAL | 3 refills | Status: DC

## 2021-09-09 NOTE — Telephone Encounter (Signed)
NHA Stephanie Charles spoke to patient today for AWV she tried to go over medications with her. However patient reports being confused about medications. She reports that she can come into the clinic. Please advise.

## 2021-09-09 NOTE — Progress Notes (Signed)
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,   Primary concerns for visit include: Patient stated she changed to a new Pharmacy, and it appears she is now confused on her medications  Recent office visits:  09/09/2021 Kirke Shaggy, LPN (Clinical Support) for Medicare Annual Wellness Exam- No medication changes noted  07/22/2021 Miguel Aschoff, MD (PCP Office Visit) for Diarrhea- No medication changes noted, lab order placed, no follow-up noted  06/15/2021 Miguel Aschoff, MD (PCP Office Visit) for Follow-up- No medication changes noted, no orders placed, patient instructed to return in 4 months  04/13/2021 Miguel Aschoff, MD (PCP Office Visit) for Hospital Follow-up- No medication changes noted, no orders placed, patient instructed to follow-up in 2 months  Recent consult visits:  04/09/2021 Darylene Price, [place] ([facility]) for CHF- No Medication Changes noted, No orders placed, patient instructed to return in 2 months  03/24/2021 Ida Rogue, MD (Cardiology) for [facility] Follow-up- No Medication changes noted, Lab order placed, EKG 12-Lead placed, patient instructed to follow-up in 1 year  Hospital visits:  Medication Reconciliation was completed by comparing discharge summary, patient's EMR and Pharmacy list, and upon discussion with patient.  Admitted to the hospital on 06/16/2021 due to Back Pain. Discharge date  was 06/16/2021. Discharged from [facility] Emergency Department  New?Medications Started at [facility] Discharge:?? -Started None ID  Medication Changes at Hospital Discharge: CHANGE None ID  Medications Discontinued at Hospital Discharge: -Stopped None ID  Medications that remain the same after Hospital Discharge:??  -All other medications will remain the same.    Medication Reconciliation was completed by comparing discharge summary, patient's EMR and Pharmacy list, and upon discussion with patient.  Admitted to the hospital on 03/11/2021 due to Vomiting and Diarrhea. Discharge date was 03/13/2021. Discharged from [place]?Medications Started at [facility] Discharge:?? -Started None ID  Medication Changes at Hospital Discharge: CHANGE how you take:-allopurinol 300 MG tablet Take 1 tablet by mouth once 1 daily Signed by: [name], MD You were taking this medication differently than prescribed  Medications Discontinued at Hospital Discharge: -Stopped None ID  Medications that remain the same after Hospital Discharge:??  -All other medications will remain the same.    Medications: Outpatient Encounter Medications as of 09/09/2021  Medication Sig   albuterol (PROVENTIL) (2.5 MG/3ML) 0.083% nebulizer solution USE 1 VIAL IN NEBULIZER EVERY 4 HOURS AS NEEDED   allopurinol (ZYLOPRIM) 300 MG tablet NEW PRESCRIPTION REQUEST: TAKE ONE TABLET BY MOUTH EVERY DAY   amitriptyline (ELAVIL) 10 MG tablet TAKE 1 TABLET BY MOUTH AT BEDTIME   amLODipine (NORVASC) 10 MG tablet NEW PRESCRIPTION REQUEST: TAKE ONE TABLET BY MOUTH EVERY DAY   ASPIRIN LOW DOSE 81 MG EC tablet NEW PRESCRIPTION REQUEST: TAKE ONE TABLET BY MOUTH EVERY DAY   atorvastatin (LIPITOR) 40 MG tablet NEW PRESCRIPTION REQUEST: TAKE ONE TABLET BY MOUTH EVERY DAY   carvedilol (COREG) 12.5 MG tablet Take 1 tablet (12.5 mg total) by mouth 2 (two) times daily.   clopidogrel (PLAVIX) 75  MG tablet NEW PRESCRIPTION REQUEST: TAKE ONE TABLET BY MOUTH EVERY DAY   Ferrous Sulfate 27 MG TABS Take 27 mg by mouth at bedtime.   hydrochlorothiazide (HYDRODIURIL) 25 MG tablet Take 25 mg by mouth daily.   hydrocortisone 2.5 % cream Apply 1 application topically 2 (two) times daily as needed.   isosorbide dinitrate (ISORDIL) 30 MG tablet NEW PRESCRIPTION REQUEST: TAKE ONE TABLET BY MOUTH EVERY DAY   isosorbide mononitrate (IMDUR) 30 MG 24 hr tablet Take 1 tablet (30 mg total) by mouth daily.   levothyroxine (SYNTHROID) 137 MCG tablet NEW PRESCRIPTION REQUEST: TAKE ONE TABLET BY MOUTH EVERY DAY IN THE MORNING   Melatonin 10 MG TABS NEW PRESCRIPTION REQUEST: TAKE ONE TABLET BY MOUTH AT BEDTIME   mirtazapine (REMERON) 30 MG tablet NEW PRESCRIPTION REQUEST: TAKE ONE TABLET BY MOUTH AT BEDTIME   Multiple Vitamin (MULTIVITAMIN) capsule Take 1 capsule by mouth every evening.   pantoprazole (PROTONIX) 40 MG tablet NEW PRESCRIPTION REQUEST: TAKE ONE TABLET BY MOUTH EVERY DAY   sacubitril-valsartan (ENTRESTO) 49-51 MG Take 1 tablet by mouth 2 (two) times daily.   sertraline (ZOLOFT) 25 MG tablet NEW PRESCRIPTION REQUEST: TAKE ONE TABLET BY MOUTH EVERY DAY   torsemide (DEMADEX) 20 MG tablet NEW PRESCRIPTION REQUEST: TAKE ONE TABLET BY MOUTH EVERY DAY   zolpidem (AMBIEN CR) 6.25 MG CR tablet Take 1 tablet (6.25 mg total) by mouth at bedtime as needed for sleep.   No facility-administered encounter medications on file as of 09/09/2021.   Care Gaps: Dexa Scan Zoster Vaccine COVID-19 Vaccine Booster 4 BP > 140/90  Star Rating Drugs: Atorvastatin 40 mg last filled on 05/10/2021 for a 90-Day supply with [place] 49-51 mg last filled on 10/17/2019 for a 30-Day supply with [place]  Have you seen any other providers since your last visit? No  Any changes in your medications or health?   Patient reports that she started with a new pharmacy, and they messed up her  medications. Patient reports that she has not felt well since starting with this pharmacy. She reports that she has felt light headed and her blood pressure is not controlled. She has been also feeling like her heart is beating out of her chest. Patient checked her BP why she was on the phone with me and it was 94/41 I did advise her to contact her cardiologist. She reports also that she is suppose to take Carvedilol 12.5 mg twice daily, but this pharmacy only sent her a 30 day supply. I encouraged her to give them a call to inform them of this mistake so they can send the correct amount of medication out to her. Patient also reports passing out last Saturday, and being under a lot of stress. Patient stated her daughter has brain cancer, and has been given 1-3 months to live.   Any side effects from any medications?   Patient stated she has been on the same medications for a long time so she does not think she is having any side effects from any of her medications.   Do you have an symptoms or problems not managed by your medications?  Patient felt like up until recently her symptoms were being managed by her medications with no problems. She reports all her issues seem to be new issues  Any concerns about your health right now?   Patient is concerned with her blood pressure changes, and the way she feels. Patient states that she really just does not  feel good at all.   Has your provider asked that you check blood pressure, blood sugar, or follow special diet at home?   Patient does check her blood pressure at home daily. Patient is not a diabetic, and patient reports she eats a normal diet she has not been asked to follow any special diets.  Do you get any type of exercise on a regular basis?   Patient states she walks to her mail box, and around her home. She does go to [place], and when she is there she will walk around [place]. Patient does not have any regular exercise routine.   Can you  think of a goal you would like to reach for your health? Patient would like to start back feeling better  Do you have any problems getting your medications? Right now the pharmacy she just switched to she is not happy with them.  Please bring medications and supplements to appointment  Lynann Bologna, [place] Pharmacist Assistant Phone: [phone]

## 2021-09-09 NOTE — Telephone Encounter (Signed)
Patient called in states is out of med, carvedilol (COREG) 12.5 MG tablet . She has been taking twice a day, and says she only receied 60 bak on 10/06 , so needs refill. Please call back.

## 2021-09-09 NOTE — Telephone Encounter (Signed)
Requested medication (s) are due for refill today: patient requesting refills  Requested medication (s) are on the active medication list: yes , multiple of same medication on med list   Last refill:  see note  Future visit scheduled: yes in 1 month  Notes to clinic:  multiple coreg 12.5 g on med list . Please clarify if patient is to take every day or twice a day. Please discontinue which medications listed that is not needed. Last noted on 08/03/21 to give coreg 12.5 mg  one every day instead of twice a day and this refill request is for twice a day. Please contact patient to clarify which PCP wants her to take .      Requested Prescriptions  Pending Prescriptions Disp Refills   carvedilol (COREG) 12.5 MG tablet [Pharmacy Med Name: Carvedilol 12.5 MG Oral Tablet] 60 tablet 0    Sig: Take 1 tablet by mouth twice daily     Cardiovascular:  Beta Blockers Failed - 09/09/2021 12:42 PM      Failed - Last BP in normal range    BP Readings from Last 1 Encounters:  07/22/21 (!) 151/86          Passed - Last Heart Rate in normal range    Pulse Readings from Last 1 Encounters:  07/22/21 85          Passed - Valid encounter within last 6 months    Recent Outpatient Visits           1 month ago Diarrhea, unspecified type   [facility] Jerrol Banana., MD   2 months ago Acute on chronic combined systolic and diastolic CHF (congestive heart failure) [facility])   [facility] Jerrol Banana., MD   4 months ago Chronic combined systolic and diastolic CHF (congestive heart failure) [facility])   [facility] Jerrol Banana., MD   1 year ago Benign essential HTN   [facility] Jerrol Banana., MD   1 year ago Benign essential HTN   [facility] Jerrol Banana., MD       Future Appointments             In 1 month Jerrol Banana., MD [facility], [facility]

## 2021-09-09 NOTE — Progress Notes (Addendum)
Virtual Visit via Telephone Note  I connected with  [name] on 09/09/21 at 11:00 AM EST by telephone and verified that I am speaking with the correct person using two identifiers.  Location: Patient: home  Provider: [facility] Persons participating in the virtual visit: [place]   I discussed the limitations, risks, security and privacy concerns of performing an evaluation and management service by telephone and the availability of in person appointments. The patient expressed understanding and agreed to proceed.  Interactive audio and video telecommunications were attempted between this nurse and patient, however failed, due to patient having technical difficulties OR patient did not have access to video capability.  We continued and completed visit with audio only.  Some vital signs may be absent or patient reported.   Dionisio David, LPN  Subjective:   Stephanie Charles is a 87 y.o. female who presents for Medicare Annual (Subsequent) preventive examination.  Review of Systems           Objective:    There were no vitals filed for this visit. There is no height or weight on file to calculate BMI.  Advanced Directives 03/11/2021 03/11/2021 03/06/2021 03/06/2021 08/18/2020 06/07/2019 06/06/2019  Does Patient Have a Medical Advance Directive? Yes No;Yes Yes Yes Yes Yes Yes  Type of Advance Directive - [place];Living will Living will;Healthcare Power of [place];Living will [place];Living will [place];Living will  Does patient want to make changes to medical advance directive? No - Patient declined - No - Patient declined - - - No - Patient declined  Copy of Healthcare Power of Attorney in Chart? - - No - copy requested - No - copy requested - No - copy requested  Would patient like information on creating a medical advance directive? - - No - Patient  declined No - Patient declined - - -    Current Medications (verified) Outpatient Encounter Medications as of 09/09/2021  Medication Sig   albuterol (PROVENTIL) (2.5 MG/3ML) 0.083% nebulizer solution USE 1 VIAL IN NEBULIZER EVERY 4 HOURS AS NEEDED   allopurinol (ZYLOPRIM) 300 MG tablet NEW PRESCRIPTION REQUEST: TAKE ONE TABLET BY MOUTH EVERY DAY   amitriptyline (ELAVIL) 10 MG tablet TAKE 1 TABLET BY MOUTH AT BEDTIME   amLODipine (NORVASC) 10 MG tablet NEW PRESCRIPTION REQUEST: TAKE ONE TABLET BY MOUTH EVERY DAY   ASPIRIN LOW DOSE 81 MG EC tablet NEW PRESCRIPTION REQUEST: TAKE ONE TABLET BY MOUTH EVERY DAY   atorvastatin (LIPITOR) 40 MG tablet NEW PRESCRIPTION REQUEST: TAKE ONE TABLET BY MOUTH EVERY DAY   carvedilol (COREG) 12.5 MG tablet Take 1 tablet by mouth twice daily   carvedilol (COREG) 12.5 MG tablet Take 1 tablet (12.5 mg total) by mouth 2 (two) times daily with a meal.   carvedilol (COREG) 12.5 MG tablet NEW PRESCRIPTION REQUEST: TAKE ONE TABLET BY MOUTH EVERY DAY   clopidogrel (PLAVIX) 75 MG tablet NEW PRESCRIPTION REQUEST: TAKE ONE TABLET BY MOUTH EVERY DAY   Ferrous Sulfate 27 MG TABS Take 27 mg by mouth at bedtime.   hydrochlorothiazide (HYDRODIURIL) 25 MG tablet Take 25 mg by mouth daily.   hydrocortisone 2.5 % cream Apply 1 application topically 2 (two) times daily as needed.   isosorbide dinitrate (ISORDIL) 30 MG tablet NEW PRESCRIPTION REQUEST: TAKE ONE TABLET BY MOUTH EVERY DAY   isosorbide mononitrate (IMDUR) 30 MG 24 hr tablet Take 1 tablet (30 mg total) by  mouth daily.   levothyroxine (SYNTHROID) 137 MCG tablet NEW PRESCRIPTION REQUEST: TAKE ONE TABLET BY MOUTH EVERY DAY IN THE MORNING   Melatonin 10 MG TABS NEW PRESCRIPTION REQUEST: TAKE ONE TABLET BY MOUTH AT BEDTIME   mirtazapine (REMERON) 30 MG tablet NEW PRESCRIPTION REQUEST: TAKE ONE TABLET BY MOUTH AT BEDTIME   Multiple Vitamin (MULTIVITAMIN) capsule Take 1 capsule by mouth every evening.   pantoprazole (PROTONIX)  40 MG tablet NEW PRESCRIPTION REQUEST: TAKE ONE TABLET BY MOUTH EVERY DAY   sacubitril-valsartan (ENTRESTO) 49-51 MG Take 1 tablet by mouth 2 (two) times daily.   sertraline (ZOLOFT) 25 MG tablet NEW PRESCRIPTION REQUEST: TAKE ONE TABLET BY MOUTH EVERY DAY   torsemide (DEMADEX) 20 MG tablet NEW PRESCRIPTION REQUEST: TAKE ONE TABLET BY MOUTH EVERY DAY   zolpidem (AMBIEN CR) 6.25 MG CR tablet Take 1 tablet (6.25 mg total) by mouth at bedtime as needed for sleep.   No facility-administered encounter medications on file as of 09/09/2021.    Allergies (verified) Latex, Levofloxacin, Lisinopril, Povidone-iodine, Simvastatin, and Codeine   History: Past Medical History:  Diagnosis Date   Anemia    Diverticulitis    GERD (gastroesophageal reflux disease)    Hyperlipidemia    Hypertension    Hypothyroidism    Myocardial infarction ([place]) 08/2017   TIA (transient ischemic attack)    1 approx 2015, 1 approx 2018   Vertigo    last episode several months ago   [name]    Past Surgical History:  Procedure Laterality Date   COLONOSCOPY WITH PROPOFOL N/A 06/22/2018   Procedure: COLONOSCOPY WITH PROPOFOL;  Surgeon: Jonathon Bellows, MD;  Location: [facility] ENDOSCOPY;  Service: Endoscopy;  Laterality: N/A;   CORONARY STENT INTERVENTION N/A 06/07/2019   Procedure: CORONARY STENT INTERVENTION;  Surgeon: [name], MD;  Location: [place] CV LAB;  Service: Cardiovascular;  Laterality: N/A;   ESOPHAGOGASTRODUODENOSCOPY (EGD) WITH PROPOFOL N/A 06/22/2018   Procedure: ESOPHAGOGASTRODUODENOSCOPY (EGD) WITH PROPOFOL;  Surgeon: Jonathon Bellows, MD;  Location[facility] ENDOSCOPY;  Service: Endoscopy;  Laterality: N/A;   KNEE ARTHROSCOPY Right    REPLACEMENT TOTAL KNEE BILATERAL     RIGHT/LEFT HEART CATH AND CORONARY ANGIOGRAPHY N/A 06/07/2019   Procedure: RIGHT/LEFT HEART CATH AND CORONARY ANGIOGRAPHY;  Surgeon: [name], MD;  Location: [place] CV LAB;  Service: Cardiovascular;   Laterality: N/A;   SHOULDER SURGERY Left    Family History  Problem Relation Age of Onset   Heart disease Mother    Hypertension Mother    Stroke Mother    Heart disease Father    Breast cancer Sister    Alzheimer's disease Brother    Heart attack Brother    Stroke Brother    Heart disease Brother    Clotting disorder Daughter    Mental illness Brother        died suicide   Cancer Brother        prostate   Heart disease Brother        atrial fib   Alzheimer's disease Brother    Fibromyalgia Daughter    Social History   Socioeconomic History   Marital status: Divorced    Spouse name: na   Number of children: 4   Years of education: 14   Highest education level: Associate degree: occupational, Hotel manager, or vocational program  Occupational History   Occupation: retired  Tobacco Use   Smoking status: Never   Smokeless tobacco: Never  Media planner  Vaping Use: Never used  Substance and Sexual Activity   Alcohol use: No    Alcohol/week: 0.0 standard drinks   Drug use: No   Sexual activity: Never  Other Topics Concern   Not on file  Social History Narrative   Not on file   Social Determinants of Health   Financial Resource Strain: Not on file  Food Insecurity: Not on file  Transportation Needs: Not on file  Physical Activity: Not on file  Stress: Not on file  Social Connections: Not on file    Tobacco Counseling Counseling given: Not Answered   Clinical Intake:  Pre-visit preparation completed: Yes  Pain : No/denies pain     Nutritional Risks: None Diabetes: No  How often do you need to have someone help you when you read instructions, pamphlets, or other written materials from your doctor or pharmacy?: 1 - Never  Diabetic?no  Interpreter Needed?: No  Information entered by :: Kirke Shaggy, LPN   Activities of Daily Living In your present state of health, do you have any difficulty performing the following activities: 03/11/2021 03/06/2021   Hearing? N N  Vision? N N  Difficulty concentrating or making decisions? N N  Walking or climbing stairs? N N  Dressing or bathing? N N  Doing errands, shopping? N N  Some recent data might be hidden    Patient Care Team: Jerrol Banana., MD as PCP - General (Family Medicine) Rockey Situ, Kathlene November, MD as PCP - Cardiology (Cardiology) Pa, [place] as Consulting Physician (Optometry)  Indicate any recent Medical Services you may have received from other than [facility] providers in the past year (date may be approximate).     Assessment:   This is a routine wellness examination for Stephanie Charles.  Hearing/Vision screen No results found.  Dietary issues and exercise activities discussed:     Goals Addressed   None    Depression Screen PHQ 2/9 Scores 08/21/2020 05/19/2020 01/18/2019 03/16/2018 05/04/2017 03/25/2016  PHQ - 2 Score 0 0 0 1 0 0  PHQ- 9 Score 2 3 - - 1 -    Fall Risk Fall Risk  08/21/2020 08/18/2020 01/18/2019 03/16/2018 05/04/2017  Falls in the past year? 0 1 0 No No  Number falls in past yr: 0 0 - - -  Injury with Fall? 0 0 - - -  Follow up Falls evaluation completed Falls prevention discussed - - -    FALL RISK PREVENTION PERTAINING TO THE HOME:  Any stairs in or around the home? No  If so, are there any without handrails? No  Home free of loose throw rugs in walkways, pet beds, electrical cords, etc? Yes  Adequate lighting in your home to reduce risk of falls? Yes   ASSISTIVE DEVICES UTILIZED TO PREVENT FALLS:  Life alert? No  Use of a cane, walker or w/c? Yes  Grab bars in the bathroom? Yes  Shower chair or bench in shower? No  Elevated toilet seat or a handicapped toilet? No    Cognitive Function:Normal cognitive status assessed by direct observation by this Nurse Health Advisor. No abnormalities found.       6CIT Screen 08/18/2020 03/16/2018  What Year? 0 points 0 points  What month? 0 points 0 points  What time? 0 points 0 points  Count back  from 20 0 points 0 points  Months in reverse 4 points 2 points  Repeat phrase 2 points 0 points  Total Score 6 2  Immunizations Immunization History  Administered Date(s) Administered   Fluad Quad(high Dose 65+) 07/03/2019, 08/21/2020, 06/15/2021   Influenza, High Dose Seasonal PF 07/03/2015, 08/05/2016, 05/25/2017, 07/05/2018   PFIZER(Purple Top)SARS-COV-2 Vaccination 11/25/2019, 12/18/2019, 08/21/2020   Pneumococcal Conjugate-13 07/03/2015   Pneumococcal Polysaccharide-23 06/20/2013   Tdap 02/23/2012   Zoster Recombinat (Shingrix) 07/05/2018   Zoster, Live 08/03/2011    TDAP status: Up to date  Flu Vaccine status: Up to date  Pneumococcal vaccine status: Up to date  Covid-19 vaccine status: Completed vaccines  Qualifies for Shingles Vaccine? Yes   Zostavax completed Yes   Shingrix Completed?: No.    Education has been provided regarding the importance of this vaccine. Patient has been advised to call insurance company to determine out of pocket expense if they have not yet received this vaccine. Advised may also receive vaccine at local pharmacy or Health Dept. Verbalized acceptance and understanding.  Screening Tests Health Maintenance  Topic Date Due   DEXA SCAN  04/10/2013   Zoster Vaccines- Shingrix (2 of 2) 08/30/2018   COVID-19 Vaccine (4 - Booster for Pfizer series) 10/16/2020   TETANUS/TDAP  02/22/2022   Pneumonia Vaccine 48+ Years old  Completed   INFLUENZA VACCINE  Completed   HPV VACCINES  Aged Out    Health Maintenance  Health Maintenance Due  Topic Date Due   DEXA SCAN  04/10/2013   Zoster Vaccines- Shingrix (2 of 2) 08/30/2018   COVID-19 Vaccine (4 - Booster for Pfizer series) 10/16/2020    Colorectal cancer screening: No longer required.   Mammogram status: No longer required due to age.   Lung Cancer Screening: (Low Dose CT Chest recommended if Age 46-80 years, 30 pack-year currently smoking OR have quit w/in 15years.) does not qualify.     Additional Screening:  Hepatitis C Screening: does not qualify; Completed no  Vision Screening: Recommended annual ophthalmology exams for early detection of glaucoma and other disorders of the eye. Is the patient up to date with their annual eye exam?  Yes  Who is the provider or what is the name of the office in which the patient attends annual eye exams? [place] Screening: Recommended annual dental exams for proper oral hygiene  Community Resource Referral / Chronic Care Management: CRR required this visit?  Yes   CCM required this visit?  Yes      Plan:     I have personally reviewed and noted the following in the patient's chart:   Medical and social history Use of alcohol, tobacco or illicit drugs  Current medications and supplements including opioid prescriptions.  Functional ability and status Nutritional status Physical activity Advanced directives List of other physicians Hospitalizations, surgeries, and ER visits in previous 12 months Vitals Screenings to include cognitive, depression, and falls Referrals and appointments  In addition, I have reviewed and discussed with patient certain preventive protocols, quality metrics, and best practice recommendations. A written personalized care plan for preventive services as well as general preventive health recommendations were provided to patient.     Dionisio David, LPN   [id number]   Nurse Notes: needs help going over meds

## 2021-09-09 NOTE — Telephone Encounter (Signed)
Medication refilled  KP

## 2021-09-09 NOTE — Chronic Care Management (AMB) (Signed)
  Chronic Care Management   Note  09/09/2021 Name: Stephanie Charles MRN: [id number] DOB: [birth year]  Stephanie Charles is a 87 y.o. year old female who is a primary care patient of Jerrol Banana., MD. I reached out to [name] by phone today in response to a referral sent by [name] PCP.  [name] was given information about Chronic Care Management services today including:  CCM service includes personalized support from designated clinical staff supervised by her physician, including individualized plan of care and coordination with other care providers 24/7 contact phone numbers for assistance for urgent and routine care needs. Service will only be billed when office clinical staff spend 20 minutes or more in a month to coordinate care. Only one practitioner may furnish and bill the service in a calendar month. The patient may stop CCM services at any time (effective at the end of the month) by phone call to the office staff. The patient is responsible for co-pay (up to 20% after annual deductible is met) if co-pay is required by the individual health plan.   Patient agreed to services and verbal consent obtained.   Follow up plan: Telephone appointment with care management team member scheduled for: 09/14/2021  Julian Hy, [facility]  Direct Dial: [phone]

## 2021-09-10 ENCOUNTER — Telehealth: Payer: Self-pay | Admitting: *Deleted

## 2021-09-10 ENCOUNTER — Inpatient Hospital Stay
Admission: EM | Admit: 2021-09-10 | Discharge: 2021-09-12 | DRG: 372 | Disposition: A | Discharge status: Home / Self Care | Payer: Medicare Other | Attending: Hospitalist | Admitting: Hospitalist

## 2021-09-10 ENCOUNTER — Encounter: Payer: Self-pay | Admitting: Emergency Medicine

## 2021-09-10 ENCOUNTER — Other Ambulatory Visit: Payer: Self-pay

## 2021-09-10 ENCOUNTER — Emergency Department: Payer: Medicare Other

## 2021-09-10 DIAGNOSIS — R410 Disorientation, unspecified: Secondary | ICD-10-CM | POA: Diagnosis not present

## 2021-09-10 DIAGNOSIS — Z91040 Latex allergy status: Secondary | ICD-10-CM | POA: Diagnosis not present

## 2021-09-10 DIAGNOSIS — E876 Hypokalemia: Secondary | ICD-10-CM | POA: Diagnosis present

## 2021-09-10 DIAGNOSIS — E039 Hypothyroidism, unspecified: Secondary | ICD-10-CM | POA: Diagnosis not present

## 2021-09-10 DIAGNOSIS — I252 Old myocardial infarction: Secondary | ICD-10-CM | POA: Diagnosis not present

## 2021-09-10 DIAGNOSIS — Z7902 Long term (current) use of antithrombotics/antiplatelets: Secondary | ICD-10-CM

## 2021-09-10 DIAGNOSIS — Z86718 Personal history of other venous thrombosis and embolism: Secondary | ICD-10-CM

## 2021-09-10 DIAGNOSIS — A0472 Enterocolitis due to Clostridium difficile, not specified as recurrent: Secondary | ICD-10-CM | POA: Diagnosis not present

## 2021-09-10 DIAGNOSIS — I10 Essential (primary) hypertension: Secondary | ICD-10-CM

## 2021-09-10 DIAGNOSIS — R55 Syncope and collapse: Secondary | ICD-10-CM | POA: Diagnosis not present

## 2021-09-10 DIAGNOSIS — I110 Hypertensive heart disease with heart failure: Secondary | ICD-10-CM | POA: Diagnosis not present

## 2021-09-10 DIAGNOSIS — R6889 Other general symptoms and signs: Secondary | ICD-10-CM | POA: Diagnosis not present

## 2021-09-10 DIAGNOSIS — Z743 Need for continuous supervision: Secondary | ICD-10-CM | POA: Diagnosis not present

## 2021-09-10 DIAGNOSIS — I2510 Atherosclerotic heart disease of native coronary artery without angina pectoris: Secondary | ICD-10-CM | POA: Diagnosis not present

## 2021-09-10 DIAGNOSIS — Z881 Allergy status to other antibiotic agents status: Secondary | ICD-10-CM

## 2021-09-10 DIAGNOSIS — Z96653 Presence of artificial knee joint, bilateral: Secondary | ICD-10-CM | POA: Diagnosis not present

## 2021-09-10 DIAGNOSIS — F32A Depression, unspecified: Secondary | ICD-10-CM | POA: Diagnosis present

## 2021-09-10 DIAGNOSIS — D649 Anemia, unspecified: Secondary | ICD-10-CM | POA: Diagnosis present

## 2021-09-10 DIAGNOSIS — N179 Acute kidney failure, unspecified: Secondary | ICD-10-CM | POA: Diagnosis present

## 2021-09-10 DIAGNOSIS — Z8249 Family history of ischemic heart disease and other diseases of the circulatory system: Secondary | ICD-10-CM

## 2021-09-10 DIAGNOSIS — Z823 Family history of stroke: Secondary | ICD-10-CM

## 2021-09-10 DIAGNOSIS — I5022 Chronic systolic (congestive) heart failure: Secondary | ICD-10-CM | POA: Diagnosis not present

## 2021-09-10 DIAGNOSIS — I440 Atrioventricular block, first degree: Secondary | ICD-10-CM | POA: Diagnosis present

## 2021-09-10 DIAGNOSIS — R0902 Hypoxemia: Secondary | ICD-10-CM | POA: Diagnosis not present

## 2021-09-10 DIAGNOSIS — Z8673 Personal history of transient ischemic attack (TIA), and cerebral infarction without residual deficits: Secondary | ICD-10-CM

## 2021-09-10 DIAGNOSIS — Z885 Allergy status to narcotic agent status: Secondary | ICD-10-CM | POA: Diagnosis not present

## 2021-09-10 DIAGNOSIS — R404 Transient alteration of awareness: Secondary | ICD-10-CM | POA: Diagnosis not present

## 2021-09-10 DIAGNOSIS — Z888 Allergy status to other drugs, medicaments and biological substances status: Secondary | ICD-10-CM

## 2021-09-10 DIAGNOSIS — Z7982 Long term (current) use of aspirin: Secondary | ICD-10-CM

## 2021-09-10 DIAGNOSIS — Z20822 Contact with and (suspected) exposure to covid-19: Secondary | ICD-10-CM | POA: Diagnosis not present

## 2021-09-10 DIAGNOSIS — Z955 Presence of coronary angioplasty implant and graft: Secondary | ICD-10-CM | POA: Diagnosis not present

## 2021-09-10 DIAGNOSIS — I5043 Acute on chronic combined systolic (congestive) and diastolic (congestive) heart failure: Secondary | ICD-10-CM

## 2021-09-10 DIAGNOSIS — I951 Orthostatic hypotension: Secondary | ICD-10-CM | POA: Diagnosis present

## 2021-09-10 DIAGNOSIS — E860 Dehydration: Secondary | ICD-10-CM | POA: Diagnosis not present

## 2021-09-10 DIAGNOSIS — Z79899 Other long term (current) drug therapy: Secondary | ICD-10-CM

## 2021-09-10 DIAGNOSIS — Z79890 Hormone replacement therapy (postmenopausal): Secondary | ICD-10-CM

## 2021-09-10 DIAGNOSIS — R609 Edema, unspecified: Secondary | ICD-10-CM

## 2021-09-10 DIAGNOSIS — K219 Gastro-esophageal reflux disease without esophagitis: Secondary | ICD-10-CM | POA: Diagnosis not present

## 2021-09-10 DIAGNOSIS — E785 Hyperlipidemia, unspecified: Secondary | ICD-10-CM | POA: Diagnosis not present

## 2021-09-10 DIAGNOSIS — R001 Bradycardia, unspecified: Secondary | ICD-10-CM | POA: Diagnosis not present

## 2021-09-10 DIAGNOSIS — I447 Left bundle-branch block, unspecified: Secondary | ICD-10-CM | POA: Diagnosis not present

## 2021-09-10 LAB — COMPREHENSIVE METABOLIC PANEL
ALT: 16 U/L (ref 0–44)
AST: 27 U/L (ref 15–41)
Albumin: 3.9 g/dL (ref 3.5–5.0)
Alkaline Phosphatase: 81 U/L (ref 38–126)
Anion gap: 12 (ref 5–15)
BUN: 60 mg/dL — ABNORMAL HIGH (ref 8–23)
CO2: 25 mmol/L (ref 22–32)
Calcium: 9 mg/dL (ref 8.9–10.3)
Chloride: 101 mmol/L (ref 98–111)
Creatinine, Ser: 2.69 mg/dL — ABNORMAL HIGH (ref 0.44–1.00)
GFR, Estimated: 17 mL/min — ABNORMAL LOW (ref >60)
Glucose, Bld: 115 mg/dL — ABNORMAL HIGH (ref 70–99)
Potassium: 3.3 mmol/L — ABNORMAL LOW (ref 3.5–5.1)
Sodium: 138 mmol/L (ref 135–145)
Total Bilirubin: 0.8 mg/dL (ref 0.3–1.2)
Total Protein: 6.5 g/dL (ref 6.5–8.1)

## 2021-09-10 LAB — CBC WITH DIFFERENTIAL/PLATELET
Abs Immature Granulocytes: 0.04 10*3/uL (ref 0.00–0.07)
Basophils Absolute: 0 10*3/uL (ref 0.0–0.1)
Basophils Relative: 0 %
Eosinophils Absolute: 0.1 10*3/uL (ref 0.0–0.5)
Eosinophils Relative: 1 %
HCT: 34.9 % — ABNORMAL LOW (ref 36.0–46.0)
Hemoglobin: 11.8 g/dL — ABNORMAL LOW (ref 12.0–15.0)
Immature Granulocytes: 0 %
Lymphocytes Relative: 13 %
Lymphs Abs: 1.4 10*3/uL (ref 0.7–4.0)
MCH: 32 pg (ref 26.0–34.0)
MCHC: 33.8 g/dL (ref 30.0–36.0)
MCV: 94.6 fL (ref 80.0–100.0)
Monocytes Absolute: 0.5 10*3/uL (ref 0.1–1.0)
Monocytes Relative: 5 %
Neutro Abs: 8.6 10*3/uL — ABNORMAL HIGH (ref 1.7–7.7)
Neutrophils Relative %: 81 %
Platelets: 159 10*3/uL (ref 150–400)
RBC: 3.69 MIL/uL — ABNORMAL LOW (ref 3.87–5.11)
RDW: 14.2 % (ref 11.5–15.5)
WBC: 10.7 10*3/uL — ABNORMAL HIGH (ref 4.0–10.5)
nRBC: 0 % (ref 0.0–0.2)

## 2021-09-10 LAB — RESP PANEL BY RT-PCR (FLU A&B, COVID) ARPGX2
Influenza A by PCR: NEGATIVE
Influenza B by PCR: NEGATIVE
SARS Coronavirus 2 by RT PCR: NEGATIVE

## 2021-09-10 LAB — TROPONIN I (HIGH SENSITIVITY)
Troponin I (High Sensitivity): 36 ng/L — ABNORMAL HIGH (ref <18)
Troponin I (High Sensitivity): 36 ng/L — ABNORMAL HIGH (ref <18)

## 2021-09-10 LAB — D-DIMER, QUANTITATIVE: D-Dimer, Quant: 2.08 ug/mL-FEU — ABNORMAL HIGH (ref 0.00–0.50)

## 2021-09-10 MED ORDER — ATORVASTATIN CALCIUM 20 MG PO TABS
40.0000 mg | ORAL_TABLET | Freq: Every day | ORAL | Status: DC
  Administered 2021-09-11 – 2021-09-12 (×2): 40 mg via ORAL
  Filled 2021-09-10 (×2): qty 2

## 2021-09-10 MED ORDER — POLYETHYLENE GLYCOL 3350 17 G PO PACK: 17.0000 g | PACK | Freq: Every day | ORAL | Status: DC | PRN

## 2021-09-10 MED ORDER — ACETAMINOPHEN 325 MG PO TABS: 650.0000 mg | ORAL_TABLET | Freq: Four times a day (QID) | ORAL | Status: DC | PRN

## 2021-09-10 MED ORDER — ISOSORBIDE MONONITRATE ER 60 MG PO TB24
30.0000 mg | ORAL_TABLET | Freq: Every day | ORAL | Status: DC
  Filled 2021-09-10: qty 1

## 2021-09-10 MED ORDER — ALBUTEROL SULFATE (2.5 MG/3ML) 0.083% IN NEBU: 2.5000 mg | INHALATION_SOLUTION | RESPIRATORY_TRACT | Status: DC | PRN

## 2021-09-10 MED ORDER — ONDANSETRON HCL 4 MG PO TABS: 4.0000 mg | ORAL_TABLET | Freq: Four times a day (QID) | ORAL | Status: DC | PRN

## 2021-09-10 MED ORDER — AMLODIPINE BESYLATE 5 MG PO TABS: 10.0000 mg | ORAL_TABLET | Freq: Every day | ORAL | Status: DC

## 2021-09-10 MED ORDER — SERTRALINE HCL 50 MG PO TABS
25.0000 mg | ORAL_TABLET | Freq: Every day | ORAL | Status: DC
  Administered 2021-09-11 – 2021-09-12 (×2): 25 mg via ORAL
  Filled 2021-09-10 (×2): qty 1

## 2021-09-10 MED ORDER — ONDANSETRON HCL 4 MG/2ML IJ SOLN: 4.0000 mg | Freq: Four times a day (QID) | INTRAMUSCULAR | Status: DC | PRN

## 2021-09-10 MED ORDER — ACETAMINOPHEN 650 MG RE SUPP: 650.0000 mg | Freq: Four times a day (QID) | RECTAL | Status: DC | PRN

## 2021-09-10 MED ORDER — SODIUM CHLORIDE 0.9 % IV SOLN: Freq: Once | INTRAVENOUS | Status: AC

## 2021-09-10 MED ORDER — MIRTAZAPINE 15 MG PO TABS
30.0000 mg | ORAL_TABLET | Freq: Every day | ORAL | Status: DC
  Administered 2021-09-10 – 2021-09-11 (×2): 30 mg via ORAL
  Filled 2021-09-10 (×2): qty 2

## 2021-09-10 MED ORDER — CARVEDILOL 12.5 MG PO TABS: 12.5000 mg | ORAL_TABLET | Freq: Two times a day (BID) | ORAL | 3 refills | Status: DC

## 2021-09-10 MED ORDER — CLOPIDOGREL BISULFATE 75 MG PO TABS
75.0000 mg | ORAL_TABLET | Freq: Every day | ORAL | Status: DC
  Administered 2021-09-11 – 2021-09-12 (×2): 75 mg via ORAL
  Filled 2021-09-10 (×2): qty 1

## 2021-09-10 MED ORDER — SODIUM CHLORIDE 0.9 % IV SOLN: INTRAVENOUS | Status: DC

## 2021-09-10 MED ORDER — ASPIRIN EC 81 MG PO TBEC
81.0000 mg | DELAYED_RELEASE_TABLET | Freq: Every day | ORAL | Status: DC
  Administered 2021-09-11 – 2021-09-12 (×2): 81 mg via ORAL
  Filled 2021-09-10 (×2): qty 1

## 2021-09-10 MED ORDER — MELATONIN 5 MG PO TABS
20.0000 mg | ORAL_TABLET | Freq: Every evening | ORAL | Status: DC | PRN
  Filled 2021-09-10: qty 4

## 2021-09-10 MED ORDER — ENOXAPARIN SODIUM 40 MG/0.4ML IJ SOSY: 40.0000 mg | PREFILLED_SYRINGE | INTRAMUSCULAR | Status: DC

## 2021-09-10 MED ORDER — HEPARIN SODIUM (PORCINE) 5000 UNIT/ML IJ SOLN
5000.0000 [IU] | Freq: Three times a day (TID) | INTRAMUSCULAR | Status: DC
  Administered 2021-09-10 – 2021-09-12 (×6): 5000 [IU] via SUBCUTANEOUS
  Filled 2021-09-10 (×6): qty 1

## 2021-09-10 MED ORDER — PANTOPRAZOLE SODIUM 40 MG PO TBEC
40.0000 mg | DELAYED_RELEASE_TABLET | Freq: Every day | ORAL | Status: DC
  Filled 2021-09-10: qty 1

## 2021-09-10 MED ORDER — MAGNESIUM HYDROXIDE 400 MG/5ML PO SUSP: 30.0000 mL | Freq: Every day | ORAL | Status: DC | PRN

## 2021-09-10 MED ORDER — ADULT MULTIVITAMIN W/MINERALS CH
1.0000 | ORAL_TABLET | Freq: Every evening | ORAL | Status: DC
  Administered 2021-09-10 – 2021-09-12 (×3): 1 via ORAL
  Filled 2021-09-10 (×3): qty 1

## 2021-09-10 MED ORDER — ZOLPIDEM TARTRATE 5 MG PO TABS: 5.0000 mg | ORAL_TABLET | Freq: Every evening | ORAL | Status: DC | PRN

## 2021-09-10 MED ORDER — TRAZODONE HCL 50 MG PO TABS
25.0000 mg | ORAL_TABLET | Freq: Every evening | ORAL | Status: DC | PRN
  Administered 2021-09-11: 25 mg via ORAL
  Filled 2021-09-10 (×2): qty 1

## 2021-09-10 MED ORDER — ALLOPURINOL 300 MG PO TABS
300.0000 mg | ORAL_TABLET | Freq: Every day | ORAL | Status: DC
  Administered 2021-09-11 – 2021-09-12 (×2): 300 mg via ORAL
  Filled 2021-09-10 (×2): qty 1

## 2021-09-10 MED ORDER — FERROUS SULFATE 325 (65 FE) MG PO TABS
325.0000 mg | ORAL_TABLET | Freq: Every day | ORAL | Status: DC
  Administered 2021-09-10 – 2021-09-11 (×2): 325 mg via ORAL
  Filled 2021-09-10 (×2): qty 1

## 2021-09-10 MED ORDER — LEVOTHYROXINE SODIUM 137 MCG PO TABS
137.0000 ug | ORAL_TABLET | Freq: Every day | ORAL | Status: DC
  Administered 2021-09-11 – 2021-09-12 (×2): 137 ug via ORAL
  Filled 2021-09-10 (×3): qty 1

## 2021-09-10 MED ORDER — CARVEDILOL 6.25 MG PO TABS
12.5000 mg | ORAL_TABLET | Freq: Two times a day (BID) | ORAL | Status: DC
  Administered 2021-09-10: 12.5 mg via ORAL
  Filled 2021-09-10: qty 2

## 2021-09-10 NOTE — ED Notes (Signed)
Patient eating dinner tray at this time. 

## 2021-09-10 NOTE — ED Notes (Signed)
Patient to CT at this time

## 2021-09-10 NOTE — ED Triage Notes (Signed)
Patient to ED via [facility] from home for syncopal episode and diarrhea. Patient's BP was initally 76/40 with EMS and after 336mL of NaCL 103/65. Patient is alert and oriented at this time.

## 2021-09-10 NOTE — ED Notes (Signed)
Mansy, MD at bedside.

## 2021-09-10 NOTE — Addendum Note (Signed)
Addended by: Shawna Orleans on: 09/10/2021 04:58 PM   Modules accepted: Orders

## 2021-09-10 NOTE — ED Notes (Signed)
Family at bedside. No needs from patient at this time.

## 2021-09-10 NOTE — ED Notes (Signed)
Dietary called at this time for meal tray.

## 2021-09-10 NOTE — Telephone Encounter (Signed)
Mrs. [name] is advised that medication has been sent.

## 2021-09-10 NOTE — ED Provider Notes (Signed)
[facility] Emergency Department Provider Note  ____________________________________________   I have reviewed the triage vital signs and the nursing notes.   HISTORY  Chief Complaint Near Syncope   History limited by: Not Limited   HPI Stephanie Charles is a 87 y.o. female who presents to the emergency department today because of concern for near syncopal episode. The patient has been under a lot of stress recently due to illness in the family. She had a syncopal episode 5 days ago but did not tell anyone. Today she had three episodes of diarrhea this morning (diarrhea apparently is not unusual for patient and she will take imodium when needed) and then started feeling like she was going to pass out. Denies actually passing out today. When this was happening she did feel like her heart was racing. The patient has also had stress recently due to her insurance switching up how she receives her medications and it is unclear she has been getting all of her medications as prescribed.   Records reviewed. Per medical record review patient has a history of MI, TIA.   Past Medical History:  Diagnosis Date   Anemia    Diverticulitis    GERD (gastroesophageal reflux disease)    Hyperlipidemia    Hypertension    Hypothyroidism    Myocardial infarction ([place]) 08/2017   TIA (transient ischemic attack)    1 approx 2015, 1 approx 2018   Vertigo    last episode several months ago   [name]     Patient Active Problem List   Diagnosis Date Noted   Hypotension 03/13/2021   Vomiting and diarrhea 03/11/2021   Pressure injury of skin 03/11/2021   Shortness of breath    Acute on chronic combined systolic and diastolic CHF (congestive heart failure) ([place]) 03/06/2021   Acute on chronic combined systolic and diastolic congestive heart failure ([place]) 03/06/2021   COVID-19 virus infection 03/06/2021   Hypothyroidism    TIA (transient ischemic attack)    CAD  (coronary artery disease)    CKD (chronic kidney disease), stage IIIa    Elevated troponin    Unstable angina ([place]) 06/07/2019   Non-ST elevation (NSTEMI) myocardial infarction [facility])    Chest pain 06/06/2019   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   Diverticulitis 04/13/2018   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 11/12/2017   Ankle edema, bilateral 05/04/2017   Abnormal blood sugar 03/11/2015   Allergic rhinitis 03/11/2015   Absolute anemia 03/11/2015   Arthropathia 03/11/2015   Benign essential HTN 03/11/2015   Benign essential tremor 03/11/2015   Artery disease, cerebral 03/11/2015   Chronic venous insufficiency 03/11/2015   Clinical depression 03/11/2015   Urinary system disease 03/11/2015   Acid reflux 03/11/2015   HLD (hyperlipidemia) 03/11/2015   Adult hypothyroidism 03/11/2015   Irritable colon 03/11/2015   Cannot sleep 03/11/2015   Weak pulse 03/11/2015   Carotid stenosis 03/11/2015   Temporary cerebral vascular dysfunction 03/11/2015   External carotid artery stenosis 07/12/2014    Past Surgical History:  Procedure Laterality Date   COLONOSCOPY WITH PROPOFOL N/A 06/22/2018   Procedure: COLONOSCOPY WITH PROPOFOL;  Surgeon: Jonathon Bellows, MD;  Location: [facility] ENDOSCOPY;  Service: Endoscopy;  Laterality: N/A;   CORONARY STENT INTERVENTION N/A 06/07/2019   Procedure: CORONARY STENT INTERVENTION;  Surgeon: [name], MD;  Location: [place] CV LAB;  Service: Cardiovascular;  Laterality: N/A;   ESOPHAGOGASTRODUODENOSCOPY (EGD) WITH PROPOFOL N/A 06/22/2018  Procedure: ESOPHAGOGASTRODUODENOSCOPY (EGD) WITH PROPOFOL;  Surgeon: Jonathon Bellows, MD;  Location: [facility] ENDOSCOPY;  Service: Endoscopy;  Laterality: N/A;   KNEE ARTHROSCOPY Right    REPLACEMENT TOTAL KNEE BILATERAL     RIGHT/LEFT HEART CATH AND CORONARY ANGIOGRAPHY N/A 06/07/2019   Procedure: RIGHT/LEFT HEART CATH AND CORONARY ANGIOGRAPHY;  Surgeon: [name], MD;  Location: [place] CV LAB;  Service: Cardiovascular;  Laterality: N/A;   SHOULDER SURGERY Left     Prior to Admission medications   Medication Sig Start Date End Date Taking? Authorizing Provider  albuterol (PROVENTIL) (2.5 MG/3ML) 0.083% nebulizer solution USE 1 VIAL IN NEBULIZER EVERY 4 HOURS AS NEEDED 06/17/21   Jerrol Banana., MD  allopurinol (ZYLOPRIM) 300 MG tablet NEW PRESCRIPTION REQUEST: TAKE ONE TABLET BY MOUTH EVERY DAY 08/03/21   Jerrol Banana., MD  amitriptyline (ELAVIL) 10 MG tablet TAKE 1 TABLET BY MOUTH AT BEDTIME 06/17/21   Jerrol Banana., MD  amLODipine (NORVASC) 10 MG tablet NEW PRESCRIPTION REQUEST: TAKE ONE TABLET BY MOUTH EVERY DAY 07/30/21   Jerrol Banana., MD  ASPIRIN LOW DOSE 81 MG EC tablet NEW PRESCRIPTION REQUEST: TAKE ONE TABLET BY MOUTH EVERY DAY 08/03/21   Jerrol Banana., MD  atorvastatin (LIPITOR) 40 MG tablet NEW PRESCRIPTION REQUEST: TAKE ONE TABLET BY MOUTH EVERY DAY 08/03/21   Jerrol Banana., MD  carvedilol (COREG) 12.5 MG tablet Take 1 tablet (12.5 mg total) by mouth 2 (two) times daily. 09/09/21   Virginia Crews, MD  clopidogrel (PLAVIX) 75 MG tablet NEW PRESCRIPTION REQUEST: TAKE ONE TABLET BY MOUTH EVERY DAY 07/30/21   Jerrol Banana., MD  Ferrous Sulfate 27 MG TABS Take 27 mg by mouth at bedtime.    [provider]  hydrochlorothiazide (HYDRODIURIL) 25 MG tablet Take 25 mg by mouth daily. 09/02/21   [provider]  hydrocortisone 2.5 % cream Apply 1 application topically 2 (two) times daily as needed. 02/05/21   [provider]  isosorbide dinitrate (ISORDIL) 30 MG tablet NEW PRESCRIPTION REQUEST: TAKE ONE TABLET BY MOUTH EVERY DAY 07/30/21   Jerrol Banana., MD  isosorbide mononitrate (IMDUR) 30 MG 24 hr tablet Take 1 tablet (30 mg total) by mouth daily. 04/13/21   Jerrol Banana., MD  levothyroxine (SYNTHROID) 137 MCG tablet NEW PRESCRIPTION REQUEST: TAKE ONE TABLET BY  MOUTH EVERY DAY IN THE MORNING 08/03/21   Jerrol Banana., MD  Melatonin 10 MG TABS NEW PRESCRIPTION REQUEST: TAKE ONE TABLET BY MOUTH AT BEDTIME 07/30/21   Jerrol Banana., MD  mirtazapine (REMERON) 30 MG tablet NEW PRESCRIPTION REQUEST: TAKE ONE TABLET BY MOUTH AT BEDTIME 07/30/21   Jerrol Banana., MD  Multiple Vitamin (MULTIVITAMIN) capsule Take 1 capsule by mouth every evening.    [provider]  pantoprazole (PROTONIX) 40 MG tablet NEW PRESCRIPTION REQUEST: TAKE ONE TABLET BY MOUTH EVERY DAY 07/30/21   Jerrol Banana., MD  sacubitril-valsartan (ENTRESTO) 49-51 MG Take 1 tablet by mouth 2 (two) times daily. 06/02/21   [name], MD  sertraline (ZOLOFT) 25 MG tablet NEW PRESCRIPTION REQUEST: TAKE ONE TABLET BY MOUTH EVERY DAY 07/30/21   Jerrol Banana., MD  torsemide (DEMADEX) 20 MG tablet NEW PRESCRIPTION REQUEST: TAKE ONE TABLET BY MOUTH EVERY DAY 07/30/21   Jerrol Banana., MD  zolpidem (AMBIEN CR) 6.25 MG CR tablet Take 1 tablet (6.25 mg total) by  mouth at bedtime as needed for sleep. 03/10/21   Max Sane, MD    Allergies Latex, Levofloxacin, Lisinopril, Povidone-iodine, Simvastatin, and Codeine  Family History  Problem Relation Age of Onset   Heart disease Mother    Hypertension Mother    Stroke Mother    Heart disease Father    Breast cancer Sister    Alzheimer's disease Brother    Heart attack Brother    Stroke Brother    Heart disease Brother    Clotting disorder Daughter    Mental illness Brother        died suicide   Cancer Brother        prostate   Heart disease Brother        atrial fib   Alzheimer's disease Brother    Fibromyalgia Daughter     Social History Social History   Tobacco Use   Smoking status: Never   Smokeless tobacco: Never  Vaping Use   Vaping Use: Never used  Substance Use Topics   Alcohol use: No    Alcohol/week: 0.0 standard drinks   Drug use: No    Review of  Systems Constitutional: No fever/chills Eyes: No visual changes. ENT: No sore throat. Cardiovascular: Positive for palpitations. Respiratory: Denies shortness of breath. Gastrointestinal: No abdominal pain.  Positive for diarrhea. Positive for nausea.   Genitourinary: Negative for dysuria. Musculoskeletal: Negative for back pain. Skin: Negative for rash. Neurological: Positive for near syncopal episode.   ____________________________________________   PHYSICAL EXAM:  VITAL SIGNS: ED Triage Vitals  Enc Vitals Group     BP 09/10/21 1423 (!) 109/51     Pulse Rate 09/10/21 1423 69     Resp 09/10/21 1423 18     Temp 09/10/21 1423 97.9 F (36.6 C)     Temp Source 09/10/21 1423 Oral     SpO2 09/10/21 1419 93 %     Weight 09/10/21 1424 155 lb (70.3 kg)     Height 09/10/21 1424 5\' 2"  (1.575 m)     Head Circumference --      Peak Flow --      Pain Score 09/10/21 1423 0   Constitutional: Alert and oriented.  Eyes: Conjunctivae are normal.  ENT      Head: Normocephalic and atraumatic.      Nose: No congestion/rhinnorhea.      Mouth/Throat: Mucous membranes are moist.      Neck: No stridor. Hematological/Lymphatic/Immunilogical: No cervical lymphadenopathy. Cardiovascular: Normal rate, regular rhythm.  No murmurs, rubs, or gallops.  Respiratory: Normal respiratory effort without tachypnea nor retractions. Breath sounds are clear and equal bilaterally. No wheezes/rales/rhonchi. Gastrointestinal: Soft and non tender. No rebound. No guarding.  Genitourinary: Deferred Musculoskeletal: Normal range of motion in all extremities. No lower extremity edema. Neurologic:  Normal speech and language. No gross focal neurologic deficits are appreciated.  Skin:  Skin is warm, dry and intact. No rash noted. Psychiatric: Mood and affect are normal. Speech and behavior are normal. Patient exhibits appropriate insight and judgment.  ____________________________________________    LABS (pertinent  positives/negatives)  Trop hs 36 CBC wbc 10.7, hgb 11.8, plt 159 CMP na 138, k 3.3, glu 115, cr 2.69  ____________________________________________   EKG  I, Nance Pear, attending physician, personally viewed and interpreted this EKG  EKG Time: 1435 Rate: 68 Rhythm: sinus rhythm with 1st degree av block Axis: normal Intervals: qtc 544 QRS: LBBB ST changes: no st elevation Impression: abnormal ekg ____________________________________________    RADIOLOGY  CT head  No acute abnormality  ____________________________________________   PROCEDURES  Procedures  ____________________________________________   INITIAL IMPRESSION / ASSESSMENT AND PLAN / ED COURSE  Pertinent labs & imaging results that were available during my care of the patient were reviewed by me and considered in my medical decision making (see chart for details).   Patient presented to the emergency department today because of concern for a near syncopal episode. The patient was found to have AKI today. Initial troponin elevated, however appears to be at patient's baseline. Ct head without any acute abnormality. Will start IV fluids in the emergency department. Will plan on admission. Discussed findings and plan with patient and family.   ____________________________________________   FINAL CLINICAL IMPRESSION(S) / ED DIAGNOSES  Final diagnoses:  AKI (acute kidney injury) ([place])  Near syncope     Note: This dictation was prepared with Diplomatic Services operational officer dictation. Any transcriptional errors that result from this process are unintentional     Nance Pear, MD 09/10/21 1640

## 2021-09-10 NOTE — Telephone Encounter (Signed)
Agree that she should see someone and go over her meds. She needs to bring everything she is taking to the appt

## 2021-09-10 NOTE — ED Notes (Signed)
Informed RN bed assigned 

## 2021-09-10 NOTE — Telephone Encounter (Signed)
Scheduled with CCM pharmacist 09/14/21.

## 2021-09-10 NOTE — Telephone Encounter (Signed)
Ok to send Coreg refill, but let them know it may be changed at the hospital and they should change it per hospital in that case

## 2021-09-10 NOTE — H&P (Addendum)
[place]   PATIENT NAME: Stephanie Charles    MR#:  [id number]  DATE OF BIRTH:  02-18-1930  DATE OF ADMISSION:  09/10/2021  PRIMARY CARE PHYSICIAN: Stephanie Charles., MD   Patient is coming from: Home  REQUESTING/REFERRING PHYSICIAN: Nance Pear, MD  CHIEF COMPLAINT:   Chief Complaint  Patient presents with  . Loss of Consciousness    HISTORY OF PRESENT ILLNESS:  Stephanie Charles is a 87 y.o. Caucasian female with medical history significant for hypertension, dyslipidemia, hypothyroidism, coronary artery disease, TIA and diverticulitis, who presented to the emergency room with  acute onset of near syncope.  The patient had abdominal CT diagnosed with glioblastoma multiforme and moving to hospice.  She just visited her in [place] and just came back but her other daughter who was with her in the ER.  She is under a lot of stress lately.  She had a syncope episode 5 days ago and did not inform anybody.  Today she had 3 episodes of diarrhea in the morning for which she took Imodium as needed and then started feeling lightheaded and dizzy with presyncope.  She denies loss of consciousness though.  She admitted to palpitations.  No chest pain.  She had nausea and chills without vomiting or fever.  No bleeding diathesis.  No chest pain or cough or wheezing or hemoptysis.  ED Course: Upon presentation to the ER blood pressure was 109/51 with otherwise normal vital signs.  Labs revealed mild hypokalemia 3.3 and elevated BUN of 60 compared to 33 on 07/22/2021 and creatinine of 2.69 compared to 1.42 then and CMP was unremarkable otherwise.  High-sensitivity troponin I was 36.  CBC showed mild anemia and WBC of 10.7.  Her respiratory panel is currently pending.  Noncontrast head CT scan revealed no acute intracranial normalities.    EKG as reviewed by me : EKG showed sinus rhythm with sinus arrhythmia and first-degree AV block and left bundle blanch block with a rate of 68. Imaging:  Portable chest ray showed low lung volumes with streaky left lung base opacity atelectasis versus pneumonia with slight improvement in diffuse interstitial opacities from recent exam.  The patient was given hydration with water 50 mL of IV normal saline per hour.  She will be admitted to an observation medical telemetry bed for further evaluation and management. PAST MEDICAL HISTORY:   Past Medical History:  Diagnosis Date  . Anemia   . Diverticulitis   . GERD (gastroesophageal reflux disease)   . Hyperlipidemia   . Hypertension   . Hypothyroidism   . Myocardial infarction ([place]) 08/2017  . TIA (transient ischemic attack)    1 approx 2015, 1 approx 2018  . Vertigo    last episode several months ago  . [name]     PAST SURGICAL HISTORY:   Past Surgical History:  Procedure Laterality Date  . COLONOSCOPY WITH PROPOFOL N/A 06/22/2018   Procedure: COLONOSCOPY WITH PROPOFOL;  Surgeon: Jonathon Bellows, MD;  Location: [facility] ENDOSCOPY;  Service: Endoscopy;  Laterality: N/A;  . CORONARY STENT INTERVENTION N/A 06/07/2019   Procedure: CORONARY STENT INTERVENTION;  Surgeon: [name], MD;  Location: [place] CV LAB;  Service: Cardiovascular;  Laterality: N/A;  . ESOPHAGOGASTRODUODENOSCOPY (EGD) WITH PROPOFOL N/A 06/22/2018   Procedure: ESOPHAGOGASTRODUODENOSCOPY (EGD) WITH PROPOFOL;  Surgeon: Jonathon Bellows, MD;  Location: [facility] ENDOSCOPY;  Service: Endoscopy;  Laterality: N/A;  . KNEE ARTHROSCOPY Right   . REPLACEMENT TOTAL KNEE  BILATERAL    . RIGHT/LEFT HEART CATH AND CORONARY ANGIOGRAPHY N/A 06/07/2019   Procedure: RIGHT/LEFT HEART CATH AND CORONARY ANGIOGRAPHY;  Surgeon: [name], MD;  Location: [place] CV LAB;  Service: Cardiovascular;  Laterality: N/A;  . SHOULDER SURGERY Left     SOCIAL HISTORY:   Social History   Tobacco Use  . Smoking status: Never  . Smokeless tobacco: Never  Substance Use Topics  . Alcohol use: No    Alcohol/week: 0.0  standard drinks    FAMILY HISTORY:   Family History  Problem Relation Age of Onset  . Heart disease Mother   . Hypertension Mother   . Stroke Mother   . Heart disease Father   . Breast cancer Sister   . Alzheimer's disease Brother   . Heart attack Brother   . Stroke Brother   . Heart disease Brother   . Clotting disorder Daughter   . Mental illness Brother        died suicide  . Cancer Brother        prostate  . Heart disease Brother        atrial fib  . Alzheimer's disease Brother   . Fibromyalgia Daughter     DRUG ALLERGIES:   Allergies  Allergen Reactions  . Latex Itching  . Levofloxacin     Mouth sores  . Lisinopril     Cough?  . Povidone-Iodine Other (See Comments)    Severe blistering and itchiness. Severe redness  . Simvastatin     Myalgias  . Codeine Nausea And Vomiting    REVIEW OF SYSTEMS:   ROS As per history of present illness. All pertinent systems were reviewed above. Constitutional, HEENT, cardiovascular, respiratory, GI, GU, musculoskeletal, neuro, psychiatric, endocrine, integumentary and hematologic systems were reviewed and are otherwise negative/unremarkable except for positive findings mentioned above in the HPI.   MEDICATIONS AT HOME:   Prior to Admission medications   Medication Sig Start Date End Date Taking? Authorizing Provider  albuterol (PROVENTIL) (2.5 MG/3ML) 0.083% nebulizer solution USE 1 VIAL IN NEBULIZER EVERY 4 HOURS AS NEEDED 06/17/21   Stephanie Charles., MD  allopurinol (ZYLOPRIM) 300 MG tablet NEW PRESCRIPTION REQUEST: TAKE ONE TABLET BY MOUTH EVERY DAY 08/03/21   Stephanie Charles., MD  amitriptyline (ELAVIL) 10 MG tablet TAKE 1 TABLET BY MOUTH AT BEDTIME 06/17/21   Stephanie Charles., MD  amLODipine (NORVASC) 10 MG tablet NEW PRESCRIPTION REQUEST: TAKE ONE TABLET BY MOUTH EVERY DAY 07/30/21   Stephanie Charles., MD  ASPIRIN LOW DOSE 81 MG EC tablet NEW PRESCRIPTION REQUEST: TAKE ONE TABLET BY MOUTH EVERY  DAY 08/03/21   Stephanie Charles., MD  atorvastatin (LIPITOR) 40 MG tablet NEW PRESCRIPTION REQUEST: TAKE ONE TABLET BY MOUTH EVERY DAY 08/03/21   Stephanie Charles., MD  carvedilol (COREG) 12.5 MG tablet Take 1 tablet (12.5 mg total) by mouth 2 (two) times daily. 09/09/21   Virginia Crews, MD  clopidogrel (PLAVIX) 75 MG tablet NEW PRESCRIPTION REQUEST: TAKE ONE TABLET BY MOUTH EVERY DAY 07/30/21   Stephanie Charles., MD  Ferrous Sulfate 27 MG TABS Take 27 mg by mouth at bedtime.    [provider]  hydrochlorothiazide (HYDRODIURIL) 25 MG tablet Take 25 mg by mouth daily. 09/02/21   [provider]  hydrocortisone 2.5 % cream Apply 1 application topically 2 (two) times daily as needed. 02/05/21   [provider]  isosorbide dinitrate (ISORDIL) 30  MG tablet NEW PRESCRIPTION REQUEST: TAKE ONE TABLET BY MOUTH EVERY DAY 07/30/21   Stephanie Charles., MD  isosorbide mononitrate (IMDUR) 30 MG 24 hr tablet Take 1 tablet (30 mg total) by mouth daily. 04/13/21   Stephanie Charles., MD  levothyroxine (SYNTHROID) 137 MCG tablet NEW PRESCRIPTION REQUEST: TAKE ONE TABLET BY MOUTH EVERY DAY IN THE MORNING 08/03/21   Stephanie Charles., MD  Melatonin 10 MG TABS NEW PRESCRIPTION REQUEST: TAKE ONE TABLET BY MOUTH AT BEDTIME 07/30/21   Stephanie Charles., MD  mirtazapine (REMERON) 30 MG tablet NEW PRESCRIPTION REQUEST: TAKE ONE TABLET BY MOUTH AT BEDTIME 07/30/21   Stephanie Charles., MD  Multiple Vitamin (MULTIVITAMIN) capsule Take 1 capsule by mouth every evening.    [provider]  pantoprazole (PROTONIX) 40 MG tablet NEW PRESCRIPTION REQUEST: TAKE ONE TABLET BY MOUTH EVERY DAY 07/30/21   Stephanie Charles., MD  sacubitril-valsartan (ENTRESTO) 49-51 MG Take 1 tablet by mouth 2 (two) times daily. 06/02/21   [name], MD  sertraline (ZOLOFT) 25 MG tablet NEW PRESCRIPTION REQUEST: TAKE ONE TABLET BY MOUTH EVERY DAY 07/30/21   Stephanie Charles., MD  torsemide (DEMADEX) 20 MG tablet NEW PRESCRIPTION REQUEST: TAKE ONE TABLET BY MOUTH EVERY DAY 07/30/21   Stephanie Charles., MD  zolpidem (AMBIEN CR) 6.25 MG CR tablet Take 1 tablet (6.25 mg total) by mouth at bedtime as needed for sleep. 03/10/21   Max Sane, MD      VITAL SIGNS:  Blood pressure (!) 100/47, pulse 81, temperature 97.9 F (36.6 C), temperature source Oral, resp. rate 18, height 5\' 2"  (1.575 m), weight 70.3 kg, SpO2 93 %.  PHYSICAL EXAMINATION:  Physical Exam  GENERAL:  87 y.o.-year-old Caucasian female patient lying in the bed with no acute distress.  EYES: Pupils equal, round, reactive to light and accommodation. No scleral icterus. Extraocular muscles intact.  HEENT: Head atraumatic, normocephalic. Oropharynx with slightly dry mucous membranes and tongue and nasopharynx clear.  NECK:  Supple, no jugular venous distention. No thyroid enlargement, no tenderness.  LUNGS: Normal breath sounds bilaterally, no wheezing, rales,rhonchi or crepitation. No use of accessory muscles of respiration.  CARDIOVASCULAR: Regular rate and rhythm, S1, S2 normal. No murmurs, rubs, or gallops.  ABDOMEN: Soft, nondistended, nontender. Bowel sounds present. No organomegaly or mass.  EXTREMITIES: No pedal edema, cyanosis, or clubbing.  NEUROLOGIC: Cranial nerves II through XII are intact. Muscle strength 5/5 in all extremities. Sensation intact. Gait not checked.  PSYCHIATRIC: The patient is alert and oriented x 3.  Normal affect and good eye contact. SKIN: No obvious rash, lesion, or ulcer.   LABORATORY PANEL:   CBC Recent Labs  Lab 09/10/21 1428  WBC 10.7*  HGB 11.8*  HCT 34.9*  PLT 159   ------------------------------------------------------------------------------------------------------------------  Chemistries  Recent Labs  Lab 09/10/21 1428  NA 138  K 3.3*  CL 101  CO2 25  GLUCOSE 115*  BUN 60*  CREATININE 2.69*  CALCIUM 9.0  AST 27  ALT 16   ALKPHOS 81  BILITOT 0.8   ------------------------------------------------------------------------------------------------------------------  Cardiac Enzymes No results for input(s): TROPONINI in the last 168 hours. ------------------------------------------------------------------------------------------------------------------  RADIOLOGY:  CT Head Wo Contrast  Result Date: 09/10/2021 CLINICAL DATA:  Syncope, confusion. EXAM: CT HEAD WITHOUT CONTRAST TECHNIQUE: Contiguous axial images were obtained from the base of the skull through the vertex without intravenous contrast. COMPARISON:  December 03, 2010. FINDINGS: Brain: No evidence of acute  infarction, hemorrhage, hydrocephalus, extra-axial collection or mass lesion/mass effect. Vascular: No hyperdense vessel or unexpected calcification. Skull: Normal. Negative for fracture or focal lesion. Sinuses/Orbits: No acute finding. Other: None. IMPRESSION: No acute intracranial abnormality seen. Electronically Signed   By: Marijo Conception M.D.   On: 09/10/2021 15:41      IMPRESSION AND PLAN:  Principal Problem:   Near syncope  1.  Near syncope likely secondary to orthostatic hypotension and volume depletion in the setting of acute acute kidney injury likely prerenal from dehydration. - The patient will be admitted to an observation medical telemetry bed. - She will be hydrated with IV normal saline with added potassium chloride. - We will follow BMP. - We will follow orthostatics. - She will be monitored for arrhythmias. - Given her recent long travel and tachycardia, we will obtain a D-dimer and if positive VQ scan.  2.  Hypokalemia. - Potassium will be replaced and magnesium level will be checked.  3.  Essential hypertension. - We will continue Norvasc and Coreg.  4.  Dyslipidemia. - We will continue statin therapy.  5.  Hypothyroidism. - We will continue Synthroid.  6.  Coronary artery disease. - We will continue Imdur, aspirin  and Plavix, beta-blocker therapy and statin therapy.  7.  Depression. - We will continue Zoloft at.  8.  GERD. - We will continue PPI therapy.   DVT prophylaxis: Lovenox.  Code Status: Was discussed with the patient and her daughter.  The patient desires to be DNI only.  She agrees to have CPR. Family Communication:  The plan of care was discussed in details with the patient (and family). I answered all questions. The patient agreed to proceed with the above mentioned plan. Further management will depend upon hospital course. Disposition Plan: Back to previous home environment Consults called: none.  All the records are reviewed and case discussed with ED provider.  Status is: Observation  Dispo: The patient is from: Home              Anticipated d/c is to: Home              Patient currently is not medically stable to d/c.              Difficult to place patient: No  TOTAL TIME TAKING CARE OF THIS PATIENT: 55 minutes.     Christel Mormon M.D on 09/10/2021 at 4:57 PM  [facility]   From 7 PM-7 AM, contact night-coverage [email or web address]  CC: Primary care physician; Stephanie Charles., MD

## 2021-09-10 NOTE — Telephone Encounter (Signed)
Pt's daughter [name] calling. States pt "Fainted" today. CAlled EMS, BP 76/40. Pt currently at hospital. Daughter [name] is calling concerned med Carvedilol is not available at pharmacy. Requested to be sent to [facility] on [address]. Advised meds may be adjusted in hospital visit. States pt is stressed because she has not had med and is trying to take care of this before she gets home. Assured NT would route to practice for PCPs review.  Please review and advise:  [phone]

## 2021-09-11 ENCOUNTER — Inpatient Hospital Stay: Payer: Medicare Other

## 2021-09-11 DIAGNOSIS — Z86718 Personal history of other venous thrombosis and embolism: Secondary | ICD-10-CM | POA: Diagnosis not present

## 2021-09-11 DIAGNOSIS — I5022 Chronic systolic (congestive) heart failure: Secondary | ICD-10-CM | POA: Diagnosis present

## 2021-09-11 DIAGNOSIS — Z96653 Presence of artificial knee joint, bilateral: Secondary | ICD-10-CM | POA: Diagnosis present

## 2021-09-11 DIAGNOSIS — I110 Hypertensive heart disease with heart failure: Secondary | ICD-10-CM | POA: Diagnosis present

## 2021-09-11 DIAGNOSIS — Z885 Allergy status to narcotic agent status: Secondary | ICD-10-CM | POA: Diagnosis not present

## 2021-09-11 DIAGNOSIS — Z888 Allergy status to other drugs, medicaments and biological substances status: Secondary | ICD-10-CM | POA: Diagnosis not present

## 2021-09-11 DIAGNOSIS — E876 Hypokalemia: Secondary | ICD-10-CM | POA: Diagnosis present

## 2021-09-11 DIAGNOSIS — D649 Anemia, unspecified: Secondary | ICD-10-CM | POA: Diagnosis present

## 2021-09-11 DIAGNOSIS — Z20822 Contact with and (suspected) exposure to covid-19: Secondary | ICD-10-CM | POA: Diagnosis present

## 2021-09-11 DIAGNOSIS — K219 Gastro-esophageal reflux disease without esophagitis: Secondary | ICD-10-CM | POA: Diagnosis present

## 2021-09-11 DIAGNOSIS — Z881 Allergy status to other antibiotic agents status: Secondary | ICD-10-CM | POA: Diagnosis not present

## 2021-09-11 DIAGNOSIS — I252 Old myocardial infarction: Secondary | ICD-10-CM | POA: Diagnosis not present

## 2021-09-11 DIAGNOSIS — I951 Orthostatic hypotension: Secondary | ICD-10-CM | POA: Diagnosis present

## 2021-09-11 DIAGNOSIS — N179 Acute kidney failure, unspecified: Secondary | ICD-10-CM | POA: Diagnosis present

## 2021-09-11 DIAGNOSIS — E785 Hyperlipidemia, unspecified: Secondary | ICD-10-CM | POA: Diagnosis present

## 2021-09-11 DIAGNOSIS — E860 Dehydration: Secondary | ICD-10-CM | POA: Diagnosis present

## 2021-09-11 DIAGNOSIS — Z955 Presence of coronary angioplasty implant and graft: Secondary | ICD-10-CM | POA: Diagnosis not present

## 2021-09-11 DIAGNOSIS — E039 Hypothyroidism, unspecified: Secondary | ICD-10-CM | POA: Diagnosis present

## 2021-09-11 DIAGNOSIS — Z91040 Latex allergy status: Secondary | ICD-10-CM | POA: Diagnosis not present

## 2021-09-11 DIAGNOSIS — Z8673 Personal history of transient ischemic attack (TIA), and cerebral infarction without residual deficits: Secondary | ICD-10-CM | POA: Diagnosis not present

## 2021-09-11 DIAGNOSIS — F32A Depression, unspecified: Secondary | ICD-10-CM | POA: Diagnosis present

## 2021-09-11 DIAGNOSIS — R600 Localized edema: Secondary | ICD-10-CM | POA: Diagnosis not present

## 2021-09-11 DIAGNOSIS — R55 Syncope and collapse: Secondary | ICD-10-CM | POA: Diagnosis not present

## 2021-09-11 DIAGNOSIS — I2510 Atherosclerotic heart disease of native coronary artery without angina pectoris: Secondary | ICD-10-CM | POA: Diagnosis present

## 2021-09-11 DIAGNOSIS — A0472 Enterocolitis due to Clostridium difficile, not specified as recurrent: Secondary | ICD-10-CM | POA: Diagnosis present

## 2021-09-11 LAB — BASIC METABOLIC PANEL
Anion gap: 8 (ref 5–15)
BUN: 63 mg/dL — ABNORMAL HIGH (ref 8–23)
CO2: 27 mmol/L (ref 22–32)
Calcium: 8.5 mg/dL — ABNORMAL LOW (ref 8.9–10.3)
Chloride: 104 mmol/L (ref 98–111)
Creatinine, Ser: 2.5 mg/dL — ABNORMAL HIGH (ref 0.44–1.00)
GFR, Estimated: 18 mL/min — ABNORMAL LOW (ref >60)
Glucose, Bld: 102 mg/dL — ABNORMAL HIGH (ref 70–99)
Potassium: 2.5 mmol/L — CL (ref 3.5–5.1)
Sodium: 139 mmol/L (ref 135–145)

## 2021-09-11 LAB — CBC
HCT: 31.6 % — ABNORMAL LOW (ref 36.0–46.0)
Hemoglobin: 10.7 g/dL — ABNORMAL LOW (ref 12.0–15.0)
MCH: 31.7 pg (ref 26.0–34.0)
MCHC: 33.9 g/dL (ref 30.0–36.0)
MCV: 93.5 fL (ref 80.0–100.0)
Platelets: 135 10*3/uL — ABNORMAL LOW (ref 150–400)
RBC: 3.38 MIL/uL — ABNORMAL LOW (ref 3.87–5.11)
RDW: 14.2 % (ref 11.5–15.5)
WBC: 9.7 10*3/uL (ref 4.0–10.5)
nRBC: 0 % (ref 0.0–0.2)

## 2021-09-11 LAB — C DIFFICILE QUICK SCREEN W PCR REFLEX
C Diff antigen: POSITIVE — AB
C Diff toxin: NEGATIVE

## 2021-09-11 LAB — URINALYSIS, COMPLETE (UACMP) WITH MICROSCOPIC
Bacteria, UA: NONE SEEN
Bilirubin Urine: NEGATIVE
Glucose, UA: NEGATIVE mg/dL
Hgb urine dipstick: NEGATIVE
Ketones, ur: NEGATIVE mg/dL
Nitrite: NEGATIVE
Protein, ur: NEGATIVE mg/dL
Specific Gravity, Urine: 1.015 (ref 1.005–1.030)
pH: 6 (ref 5.0–8.0)

## 2021-09-11 LAB — CLOSTRIDIUM DIFFICILE BY PCR, REFLEXED: Toxigenic C. Difficile by PCR: POSITIVE — AB

## 2021-09-11 LAB — MAGNESIUM: Magnesium: 1.6 mg/dL — ABNORMAL LOW (ref 1.7–2.4)

## 2021-09-11 MED ORDER — MAGNESIUM SULFATE 2 GM/50ML IV SOLN
2.0000 g | Freq: Once | INTRAVENOUS | Status: AC
  Administered 2021-09-11: 2 g via INTRAVENOUS
  Filled 2021-09-11: qty 50

## 2021-09-11 MED ORDER — POTASSIUM CHLORIDE CRYS ER 20 MEQ PO TBCR
40.0000 meq | EXTENDED_RELEASE_TABLET | Freq: Once | ORAL | Status: AC
  Administered 2021-09-11: 40 meq via ORAL
  Filled 2021-09-11: qty 2

## 2021-09-11 MED ORDER — VANCOMYCIN HCL 125 MG PO CAPS
125.0000 mg | ORAL_CAPSULE | Freq: Four times a day (QID) | ORAL | Status: DC
  Administered 2021-09-11 – 2021-09-12 (×7): 125 mg via ORAL
  Filled 2021-09-11 (×8): qty 1

## 2021-09-11 MED ORDER — SACCHAROMYCES BOULARDII 250 MG PO CAPS
250.0000 mg | ORAL_CAPSULE | Freq: Two times a day (BID) | ORAL | Status: DC
  Administered 2021-09-11 – 2021-09-12 (×4): 250 mg via ORAL
  Filled 2021-09-11 (×5): qty 1

## 2021-09-11 MED ORDER — POTASSIUM CHLORIDE 10 MEQ/100ML IV SOLN
10.0000 meq | INTRAVENOUS | Status: AC
  Administered 2021-09-11 (×3): 10 meq via INTRAVENOUS
  Filled 2021-09-11 (×2): qty 100

## 2021-09-11 NOTE — Progress Notes (Signed)
Patient c/o diarrhea for several months now without a known source. Two large liquid bowel movements this shift, notified Sharion Settler NP, stool specimen sent to lab to evaluate for c.dificile. Critical potassium this morning, see new orders.    09/11/21 0516  Provider Notification  Provider Name/Title Sharion Settler NP  Date Provider Notified 09/11/21  Time Provider Notified [phone]  Notification Type Page  Notification Reason Critical result  Test performed and critical result potassium 2.5  Date Critical Result Received 09/11/21  Time Critical Result Received 0511  Provider response See new orders  Date of Provider Response 09/11/21  Time of Provider Response [phone]

## 2021-09-11 NOTE — Progress Notes (Signed)
PROGRESS NOTE  Stephanie Charles  DOB: 1930/08/29  PCP: Jerrol Banana., MD EAV:[id number]  DOA: 09/10/2021  LOS: 0 days  Hospital Day: 2  Chief Complaint  Patient presents with   Loss of Consciousness    Brief narrative: Stephanie Charles is a 87 y.o. female with PMH significant for HTN, HLD, CAD/MI, TIA, chronic anemia, hypothyroidism. Patient presented to the ED on 12/8 with an episode of near syncope.  Patient recently visited one of her daughters in [place] who had glioblastoma multiforme and moving to hospice.  Patient is under a lot of stress recently.   She had a syncopal episode 5 days ago and did not inform anybody.  On 12/8, she had 3 episodes of diarrhea in the morning for which she took Imodium.  She started pain likely related and did not and had a near syncopal episode without loss of consciousness and hence she was brought to the ED.  In the ED, patient had a blood pressure 109/51 Labs showed potassium level low at 3.3, BUN/creatinine elevated to 60/2.69 from a baseline creatinine 1.4 to CMP otherwise unremarkable CBC showed WBC 10.7, mild anemia CTA did not show any acute abnormality. EKG sinus rhythm with first-degree AV block Chest x-ray showed low lung volumes with basilar atelectasis Patient was started on IV fluid and kept on observation under hospitalist service.  Subjective: Patient was seen and examined this morning.  Pleasant elderly Caucasian female.  Lying on bed.  I was called at bedside by RN today for an acute episode of bradycardia, hypotension. Patient had gotten up to commode for bowel movement, had 3 episodes of diarrhea this morning.  Few minutes after coming back to her bed, she started turning pale.  She had drop in blood pressure and heart rate with soon improved.  Chart reviewed Afebrile, blood pressure in low normal range overnight.  Breathing on room air Labs this morning with potassium low at 2.5, creatinine slightly better at 2.5,  magnesium low at 1.6, hemoglobin further low at 10.7, WBC count normalized Stool assay positive for C. difficile antigen but negative for toxin.  Assessment/Plan: Near syncopal episode -Patient reportedly started having diarrhea yesterday morning leading to lightheadedness and near syncopal episode.  Probably because of orthostatic hypotension. -Patient congestive diarrhea and had an episode of vasovagal episode this morning as well. -Currently on IV fluid.  Repeat orthostatic vitals. -Continue to monitor in telemetry. -Because of recent history of recent travel, obtain ultrasound DVT scan, although low likelihood.  C. difficile antigen positive -Presented with diarrhea and dehydration.  Continue to have diarrhea.  C. difficile antigen positive but toxin negative. -Because of the severity of symptoms, I would choose to treat her as a true C. difficile infection.  Hypokalemia/hypomagnesemia -Oral and IV replacement ordered. -Repeat labs tomorrow.  Obtain phosphorus level as well Recent Labs  Lab 09/10/21 1428 09/11/21 0330  K 3.3* 2.5*  MG  --  1.6*   Chronic systolic CHF History of essential hypertension -In 2020, she had low EF of 35 to 40%.  With medical optimization, it improved to 50 to 55% in June 2022.   -Home meds include Coreg, Isordil, Entresto, torsemide.  Also on amlodipine and HCTZ for hypertension. -At this time because of orthostatic hypotension and bradycardia, I would keep all the blood pressure medicines on hold.  CAD/MI HLD -Continue aspirin, Plavix, statin  Hypothyroidism -Statin  GERD -PPI and ferrous sulfate  Depression -Zoloft  Mobility: PT eval Living condition: Lives at  home with one of her daughters Goals of care:   Code Status: Partial Code  Nutritional status: Body mass index is 28.35 kg/m.      Diet:  Diet Order             Diet Heart Room service appropriate? Yes; Fluid consistency: Thin  Diet effective now                   DVT prophylaxis: heparin injection 5,000 Units Start: 09/10/21 2200   Antimicrobials: Oral vancomycin Fluid: IV normal saline at 100 mill per hour Consultants: None Family Communication: None at bedside  Status is: Observation  Continue in-hospital care because: Needs IV fluid, continue to monitor for diarrhea Level of care: Telemetry Medical   Dispo: The patient is from: Home              Anticipated d/c is to: Home in 2 to 3 days              Patient currently is not medically stable to d/c.   Difficult to place patient No     Infusions:   sodium chloride 100 mL/hr at 09/11/21 0507   magnesium sulfate bolus IVPB 2 g (09/11/21 1605)    Scheduled Meds:  allopurinol  300 mg Oral Daily   aspirin EC  81 mg Oral Daily   atorvastatin  40 mg Oral Daily   clopidogrel  75 mg Oral Daily   ferrous sulfate  325 mg Oral QHS   heparin injection (subcutaneous)  5,000 Units Subcutaneous Q8H   levothyroxine  137 mcg Oral Q0600   mirtazapine  30 mg Oral QHS   multivitamin with minerals  1 tablet Oral QPM   saccharomyces boulardii  250 mg Oral BID   sertraline  25 mg Oral Daily   vancomycin  125 mg Oral QID    PRN meds: acetaminophen **OR** acetaminophen, albuterol, melatonin, ondansetron **OR** ondansetron (ZOFRAN) IV, polyethylene glycol, traZODone   Antimicrobials: Anti-infectives (From admission, onward)    Start     Dose/Rate Route Frequency Ordered Stop   09/11/21 1030  vancomycin (VANCOCIN) capsule 125 mg        125 mg Oral 4 times daily 09/11/21 0931 09/21/21 0959       Objective: Vitals:   09/11/21 1200 09/11/21 1549  BP: 110/62 (!) 125/58  Pulse: 63 76  Resp:  20  Temp: 98.2 F (36.8 C) 98 F (36.7 C)  SpO2: 98% 93%    Intake/Output Summary (Last 24 hours) at 09/11/2021 1621 Last data filed at 09/11/2021 0826 Gross per 24 hour  Intake 1409.26 ml  Output 750 ml  Net 659.26 ml   Filed Weights   09/10/21 1424  Weight: 70.3 kg   Weight change:  Body  mass index is 28.35 kg/m.   Physical Exam: General exam: Pleasant, elderly Caucasian female.  Not in physical distress Skin: No rashes, lesions or ulcers. HEENT: Atraumatic, normocephalic, no obvious bleeding Lungs: Clear to auscultation bilaterally CVS: Regular rate and rhythm, no murmur GI/Abd soft, nontender, nondistended, bowel sound present CNS: Alert, awake, oriented x3 Psychiatry: Mood appropriate Extremities: No pedal edema, no calf tenderness  Data Review: I have personally reviewed the laboratory data and studies available.  F/u labs ordered Unresulted Labs (From admission, onward)     Start     Ordered   09/12/21 0500  Phosphorus  Tomorrow morning,   R        09/11/21 0932   09/12/21 0500  CBC with Differential/Platelet  Daily,   R      09/11/21 0932   09/12/21 7544  Basic metabolic panel  Daily,   R      09/11/21 0932   09/12/21 0500  Magnesium  Tomorrow morning,   R        09/11/21 0932            Signed, Terrilee Croak, MD [facility] 09/11/2021

## 2021-09-12 LAB — CBC WITH DIFFERENTIAL/PLATELET
Abs Immature Granulocytes: 0.03 10*3/uL (ref 0.00–0.07)
Basophils Absolute: 0 10*3/uL (ref 0.0–0.1)
Basophils Relative: 0 %
Eosinophils Absolute: 0.1 10*3/uL (ref 0.0–0.5)
Eosinophils Relative: 2 %
HCT: 33.2 % — ABNORMAL LOW (ref 36.0–46.0)
Hemoglobin: 11.2 g/dL — ABNORMAL LOW (ref 12.0–15.0)
Immature Granulocytes: 0 %
Lymphocytes Relative: 22 %
Lymphs Abs: 1.7 10*3/uL (ref 0.7–4.0)
MCH: 31.5 pg (ref 26.0–34.0)
MCHC: 33.7 g/dL (ref 30.0–36.0)
MCV: 93.3 fL (ref 80.0–100.0)
Monocytes Absolute: 0.3 10*3/uL (ref 0.1–1.0)
Monocytes Relative: 5 %
Neutro Abs: 5.3 10*3/uL (ref 1.7–7.7)
Neutrophils Relative %: 71 %
Platelets: 125 10*3/uL — ABNORMAL LOW (ref 150–400)
RBC: 3.56 MIL/uL — ABNORMAL LOW (ref 3.87–5.11)
RDW: 14 % (ref 11.5–15.5)
WBC: 7.4 10*3/uL (ref 4.0–10.5)
nRBC: 0 % (ref 0.0–0.2)

## 2021-09-12 LAB — BASIC METABOLIC PANEL
Anion gap: 7 (ref 5–15)
BUN: 39 mg/dL — ABNORMAL HIGH (ref 8–23)
CO2: 24 mmol/L (ref 22–32)
Calcium: 8.7 mg/dL — ABNORMAL LOW (ref 8.9–10.3)
Chloride: 111 mmol/L (ref 98–111)
Creatinine, Ser: 1.39 mg/dL — ABNORMAL HIGH (ref 0.44–1.00)
GFR, Estimated: 36 mL/min — ABNORMAL LOW (ref >60)
Glucose, Bld: 105 mg/dL — ABNORMAL HIGH (ref 70–99)
Potassium: 3.1 mmol/L — ABNORMAL LOW (ref 3.5–5.1)
Sodium: 142 mmol/L (ref 135–145)

## 2021-09-12 LAB — PHOSPHORUS: Phosphorus: 2.3 mg/dL — ABNORMAL LOW (ref 2.5–4.6)

## 2021-09-12 LAB — MAGNESIUM: Magnesium: 2.1 mg/dL (ref 1.7–2.4)

## 2021-09-12 MED ORDER — VANCOMYCIN HCL 125 MG PO CAPS: 125.0000 mg | ORAL_CAPSULE | Freq: Four times a day (QID) | ORAL | 0 refills | Status: DC

## 2021-09-12 MED ORDER — AMLODIPINE BESYLATE 10 MG PO TABS: ORAL_TABLET | ORAL | 1 refills | Status: DC

## 2021-09-12 MED ORDER — TORSEMIDE 20 MG PO TABS: ORAL_TABLET | ORAL | 1 refills | Status: DC

## 2021-09-12 MED ORDER — POTASSIUM CHLORIDE CRYS ER 20 MEQ PO TBCR: 40.0000 meq | EXTENDED_RELEASE_TABLET | Freq: Every day | ORAL | 0 refills | Status: DC

## 2021-09-12 MED ORDER — POTASSIUM PHOSPHATES 15 MMOLE/5ML IV SOLN
30.0000 mmol | Freq: Once | INTRAVENOUS | Status: AC
  Administered 2021-09-12: 14:00:00 30 mmol via INTRAVENOUS
  Filled 2021-09-12: qty 10

## 2021-09-12 MED ORDER — HYDROCHLOROTHIAZIDE 25 MG PO TABS: ORAL_TABLET | ORAL | Status: DC

## 2021-09-12 NOTE — Progress Notes (Signed)
Pt AVS reviewed and pt verbalized understanding of all DC teaching and instructions. Pt will be going home with daughter as transportation and has all pt belongings in her possession.

## 2021-09-12 NOTE — Progress Notes (Signed)
PT Cancellation Note  Patient Details Name: Stephanie Charles MRN: [id number] DOB: 1930-08-10   Cancelled Treatment:    Reason Eval/Treat Not Completed: Patient declined, no reason specified  PT attempted evaluation. Patient states, "I don't need any physical therapy. Thankyou but I have no interest in doing any therapy." PT further asked if orthostatic vitals could be assessed and patient declined. She stated, "I don't want to get up out of this bed." She said she was hoping to go home soon. Therapist then suggested that her physician would like for her to be assessed for balance and safety before going home and patient refused.  Will re-attempt when patient is willing to participate.  Lyllie Cobbins PT, DPT 09/12/2021, 1:08 PM

## 2021-09-12 NOTE — Progress Notes (Addendum)
Patient discharged to home via daughter at 2000,IV removed, no complaints.

## 2021-09-12 NOTE — Discharge Summary (Addendum)
Physician Discharge Chardon  female DOB: 11/20/1929  BDZ:[id number]  PCP: Jerrol Banana., MD  Admit date: 09/10/2021 Discharge date: 09/12/2021  Admitted From: home Disposition:  home Family updated prior to discharge.  Home Health: No.  Pt declined PT eval. CODE STATUS: Limited code  Discharge Instructions     Discharge instructions   Complete by: As directed    You have C diff infection that caused severe diarrhea.  Please finish 9 more days of oral vancomycin at home for treatment.  Because of the diarrhea and dehydration, your blood pressure on presentation was low, and you had some acute kidney injury.  Kidney function improved with IV fluids.    Hold amlodipine, hydrochlorothiazide, and torsemide until followup with your outpatient doctor due to your soft blood pressure and recent acute kidney injury.  Please take 7 days of potassium supplement to replete the loss from diarrhea.   Dr. Enzo Bi [facility] Course:  For full details, please see H&P, progress notes, consult notes and ancillary notes.  Briefly,  Stephanie Charles is a 87 y.o. female with PMH significant for HTN, CAD/MI, TIA, chronic anemia, hypothyroidism. Patient presented to the ED on 12/8 with an episode of near syncope.   Patient recently visited one of her daughters in [place] who had glioblastoma multiforme and moving to hospice.  Patient is under a lot of stress recently.   She had a syncopal episode 5 days ago and did not inform anybody.  On 12/8, she had 3 episodes of diarrhea in the morning for which she took Imodium, and then started feeling lightheaded and dizzy with presyncope, hence she was brought to the ED.  Near syncopal episode Likely due to orthostatic hypotension due to volume loss from diarrhea. --BP improved after IVF.  Pt declined orthostatic vitals.     Diarrhea 2/2 C. difficile infection -Presented with diarrhea and dehydration.  Continue  to have diarrhea.  C. difficile antigen positive but toxin negative, and PCR positive.  -Because of the severity of symptoms, pt is started on oral vanc for C diff treatment and will complete 10-day course at home.   AKI --Cr 2.69 on presentation, improved with IVF to 1.39 prior to discharge.  Hypokalemia/hypomagnesemia --due to GI loss.  Repleted. --pt was prescribed 7 days of potassium supplement at discharge.  Chronic systolic CHF History of essential hypertension -In 2020, she had low EF of 35 to 40%.  With medical optimization, it improved to 50 to 55% in June 2022.   --cont home coreg and Entresto  HTN Home meds include Coreg, Isordil (not Imdur), Entresto, torsemide, amlodipine and HCTZ, all were held on presentation due to hypotension. --home coreg, Entresto and Isordil resumed at discharge. --Home amlodipine, hydrochlorothiazide, and torsemide until followup due to soft blood pressure and acute kidney injury.  CAD/MI HLD -Continue aspirin, Plavix, statin   Hypothyroidism --cont Synthroid   GERD -PPI    Depression -Zoloft   Discharge Diagnoses:  Principal Problem:   Near syncope   30 Day Unplanned Readmission Risk Score    Flowsheet Row ED to Hosp-Admission (Current) from 09/10/2021 in [facility] (1C)  30 Day Unplanned Readmission Risk Score (%) 21.35 Filed at 09/12/2021 1200       This score is the patient's risk of an unplanned readmission within 30 days of being discharged (0 -100%). The score is based on dignosis, age, lab  data, medications, orders, and past utilization.   Low:  0-14.9   Medium: 15-21.9   High: 22-29.9   Extreme: 30 and above         Discharge Instructions:  Allergies as of 09/12/2021       Reactions   Latex Itching   Levofloxacin    Mouth sores   Lisinopril    Cough?   Povidone-iodine Other (See Comments)   Severe blistering and itchiness. Severe redness   Simvastatin    Myalgias   Codeine  Nausea And Vomiting        Medication List     STOP taking these medications    isosorbide mononitrate 30 MG 24 hr tablet Commonly known as: IMDUR   zolpidem 6.25 MG CR tablet Commonly known as: Ambien CR       TAKE these medications    albuterol (2.5 MG/3ML) 0.083% nebulizer solution Commonly known as: PROVENTIL USE 1 VIAL IN NEBULIZER EVERY 4 HOURS AS NEEDED What changed: See the new instructions.   allopurinol 300 MG tablet Commonly known as: ZYLOPRIM NEW PRESCRIPTION REQUEST: TAKE ONE TABLET BY MOUTH EVERY DAY   amitriptyline 10 MG tablet Commonly known as: ELAVIL TAKE 1 TABLET BY MOUTH AT BEDTIME   amLODipine 10 MG tablet Commonly known as: NORVASC Hold until followup with outpatient provider due to soft blood pressure. What changed: See the new instructions.   Aspirin Low Dose 81 MG EC tablet Generic drug: aspirin NEW PRESCRIPTION REQUEST: TAKE ONE TABLET BY MOUTH EVERY DAY   atorvastatin 40 MG tablet Commonly known as: LIPITOR NEW PRESCRIPTION REQUEST: TAKE ONE TABLET BY MOUTH EVERY DAY   carvedilol 12.5 MG tablet Commonly known as: COREG Take 1 tablet (12.5 mg total) by mouth 2 (two) times daily.   clopidogrel 75 MG tablet Commonly known as: PLAVIX NEW PRESCRIPTION REQUEST: TAKE ONE TABLET BY MOUTH EVERY DAY   Entresto 49-51 MG Generic drug: sacubitril-valsartan Take 1 tablet by mouth 2 (two) times daily.   Ferrous Sulfate 27 MG Tabs Take 27 mg by mouth at bedtime.   hydrochlorothiazide 25 MG tablet Commonly known as: HYDRODIURIL Hold until followup with outpatient provider due to recent acute kidney injury from dehydration. What changed:  how much to take how to take this when to take this additional instructions   hydrocortisone 2.5 % cream Apply 1 application topically 2 (two) times daily as needed.   isosorbide dinitrate 30 MG tablet Commonly known as: ISORDIL NEW PRESCRIPTION REQUEST: TAKE ONE TABLET BY MOUTH EVERY DAY What  changed: See the new instructions.   levothyroxine 137 MCG tablet Commonly known as: SYNTHROID NEW PRESCRIPTION REQUEST: TAKE ONE TABLET BY MOUTH EVERY DAY IN THE MORNING What changed: See the new instructions.   Melatonin 10 MG Tabs NEW PRESCRIPTION REQUEST: TAKE ONE TABLET BY MOUTH AT BEDTIME What changed: See the new instructions.   mirtazapine 30 MG tablet Commonly known as: REMERON NEW PRESCRIPTION REQUEST: TAKE ONE TABLET BY MOUTH AT BEDTIME What changed: See the new instructions.   multivitamin capsule Take 1 capsule by mouth every evening.   pantoprazole 40 MG tablet Commonly known as: PROTONIX NEW PRESCRIPTION REQUEST: TAKE ONE TABLET BY MOUTH EVERY DAY   potassium chloride SA 20 MEQ tablet Commonly known as: KLOR-CON M Take 2 tablets (40 mEq total) by mouth daily for 7 days.   sertraline 25 MG tablet Commonly known as: ZOLOFT NEW PRESCRIPTION REQUEST: TAKE ONE TABLET BY MOUTH EVERY DAY   torsemide 20 MG tablet Commonly known  as: DEMADEX Hold until followup with outpatient provider due to recent acute kidney injury from dehydration. What changed: See the new instructions.   vancomycin 125 MG capsule Commonly known as: VANCOCIN Take 1 capsule (125 mg total) by mouth 4 (four) times daily for 9 days.         Follow-up Information     [name], MD .   Specialty: Cardiology Contact information: [address] [place] [address] [phone]                 Allergies  Allergen Reactions   Latex Itching   Levofloxacin     Mouth sores   Lisinopril     Cough?   Povidone-Iodine Other (See Comments)    Severe blistering and itchiness. Severe redness   Simvastatin     Myalgias   Codeine Nausea And Vomiting     The results of significant diagnostics from this hospitalization (including imaging, microbiology, ancillary and laboratory) are listed below for reference.   Consultations:   Procedures/Studies: CT Head  Wo Contrast  Result Date: 09/10/2021 CLINICAL DATA:  Syncope, confusion. EXAM: CT HEAD WITHOUT CONTRAST TECHNIQUE: Contiguous axial images were obtained from the base of the skull through the vertex without intravenous contrast. COMPARISON:  December 03, 2010. FINDINGS: Brain: No evidence of acute infarction, hemorrhage, hydrocephalus, extra-axial collection or mass lesion/mass effect. Vascular: No hyperdense vessel or unexpected calcification. Skull: Normal. Negative for fracture or focal lesion. Sinuses/Orbits: No acute finding. Other: None. IMPRESSION: No acute intracranial abnormality seen. Electronically Signed   By: Marijo Conception M.D.   On: 09/10/2021 15:41   US Venous Img Lower Bilateral (DVT)  Result Date: 09/11/2021 CLINICAL DATA:  Lower extremity edema and history of prior DVT. EXAM: BILATERAL LOWER EXTREMITY VENOUS DOPPLER ULTRASOUND TECHNIQUE: Gray-scale sonography with graded compression, as well as color Doppler and duplex ultrasound were performed to evaluate the lower extremity deep venous systems from the level of the common femoral vein and including the common femoral, femoral, profunda femoral, popliteal and calf veins including the posterior tibial, peroneal and gastrocnemius veins when visible. The superficial great saphenous vein was also interrogated. Spectral Doppler was utilized to evaluate flow at rest and with distal augmentation maneuvers in the common femoral, femoral and popliteal veins. COMPARISON:  Prior study on 11/15/2008 FINDINGS: RIGHT LOWER EXTREMITY Common Femoral Vein: No evidence of thrombus. Normal compressibility, respiratory phasicity and response to augmentation. Saphenofemoral Junction: No evidence of thrombus. Normal compressibility and flow on color Doppler imaging. Profunda Femoral Vein: No evidence of thrombus. Normal compressibility and flow on color Doppler imaging. Femoral Vein: No evidence of thrombus. Normal compressibility, respiratory phasicity and  response to augmentation. Popliteal Vein: No evidence of thrombus. Normal compressibility, respiratory phasicity and response to augmentation. Calf Veins: No evidence of thrombus. Normal compressibility and flow on color Doppler imaging. Superficial Great Saphenous Vein: No evidence of thrombus. Normal compressibility. Venous Reflux:  None. Other Findings: No evidence of superficial thrombophlebitis or abnormal fluid collection. LEFT LOWER EXTREMITY Common Femoral Vein: No evidence of thrombus. Normal compressibility, respiratory phasicity and response to augmentation. Saphenofemoral Junction: No evidence of thrombus. Normal compressibility and flow on color Doppler imaging. Profunda Femoral Vein: No evidence of thrombus. Normal compressibility and flow on color Doppler imaging. Femoral Vein: No evidence of thrombus. Normal compressibility, respiratory phasicity and response to augmentation. Popliteal Vein: No evidence of thrombus. Normal compressibility, respiratory phasicity and response to augmentation. Calf Veins: No evidence of thrombus. Normal compressibility and  flow on color Doppler imaging. Superficial Great Saphenous Vein: No evidence of thrombus. Normal compressibility. Venous Reflux:  None. Other Findings: No evidence of superficial thrombophlebitis or abnormal fluid collection. IMPRESSION: No evidence of deep venous thrombosis in either lower extremity. Electronically Signed   By: Aletta Edouard M.D.   On: 09/11/2021 16:53      Labs: BNP (last 3 results) Recent Labs    03/06/21 1033  BNP 106.2*   Basic Metabolic Panel: Recent Labs  Lab 09/10/21 1428 09/11/21 0330 09/12/21 0627  NA 138 139 142  K 3.3* 2.5* 3.1*  CL 101 104 111  CO2 25 27 24   GLUCOSE 115* 102* 105*  BUN 60* 63* 39*  CREATININE 2.69* 2.50* 1.39*  CALCIUM 9.0 8.5* 8.7*  MG  --  1.6* 2.1  PHOS  --   --  2.3*   Liver Function Tests: Recent Labs  Lab 09/10/21 1428  AST 27  ALT 16  ALKPHOS 81  BILITOT 0.8   PROT 6.5  ALBUMIN 3.9   No results for input(s): LIPASE, AMYLASE in the last 168 hours. No results for input(s): AMMONIA in the last 168 hours. CBC: Recent Labs  Lab 09/10/21 1428 09/11/21 0330 09/12/21 0627  WBC 10.7* 9.7 7.4  NEUTROABS 8.6*  --  5.3  HGB 11.8* 10.7* 11.2*  HCT 34.9* 31.6* 33.2*  MCV 94.6 93.5 93.3  PLT 159 135* 125*   Cardiac Enzymes: No results for input(s): CKTOTAL, CKMB, CKMBINDEX, TROPONINI in the last 168 hours. BNP: Invalid input(s): POCBNP CBG: No results for input(s): GLUCAP in the last 168 hours. D-Dimer Recent Labs    09/10/21 1428  DDIMER 2.08*   Hgb A1c No results for input(s): HGBA1C in the last 72 hours. Lipid Profile No results for input(s): CHOL, HDL, LDLCALC, TRIG, CHOLHDL, LDLDIRECT in the last 72 hours. Thyroid function studies No results for input(s): TSH, T4TOTAL, T3FREE, THYROIDAB in the last 72 hours.  Invalid input(s): FREET3 Anemia work up No results for input(s): VITAMINB12, FOLATE, FERRITIN, TIBC, IRON, RETICCTPCT in the last 72 hours. Urinalysis    Component Value Date/Time   COLORURINE STRAW (A) 09/10/2021 0635   APPEARANCEUR CLEAR 09/10/2021 0635   APPEARANCEUR Clear 07/11/2018 1326   LABSPEC 1.015 09/10/2021 0635   LABSPEC 1.011 01/22/2013 1416   PHURINE 6.0 09/10/2021 0635   GLUCOSEU NEGATIVE 09/10/2021 0635   GLUCOSEU Negative 01/22/2013 1416   HGBUR NEGATIVE 09/10/2021 0635   BILIRUBINUR NEGATIVE 09/10/2021 0635   BILIRUBINUR Negative 07/11/2018 1326   BILIRUBINUR Negative 01/22/2013 1416   KETONESUR NEGATIVE 09/10/2021 0635   PROTEINUR NEGATIVE 09/10/2021 0635   UROBILINOGEN 0.2 03/22/2018 1053   NITRITE NEGATIVE 09/10/2021 0635   LEUKOCYTESUR SMALL (A) 09/10/2021 0635   LEUKOCYTESUR 3+ 01/22/2013 1416   Sepsis Labs Invalid input(s): PROCALCITONIN,  WBC,  LACTICIDVEN Microbiology Recent Results (from the past 240 hour(s))  Resp Panel by RT-PCR (Flu A&B, Covid) Nasopharyngeal Swab     Status: None    Collection Time: 09/10/21  4:50 PM   Specimen: Nasopharyngeal Swab; Nasopharyngeal(NP) swabs in vial transport medium  Result Value Ref Range Status   SARS Coronavirus 2 by RT PCR NEGATIVE NEGATIVE Final    Comment: (NOTE) SARS-CoV-2 target nucleic acids are NOT DETECTED.  The SARS-CoV-2 RNA is generally detectable in upper respiratory specimens during the acute phase of infection. The lowest concentration of SARS-CoV-2 viral copies this assay can detect is 138 copies/mL. A negative result does not preclude SARS-Cov-2 infection and should not be used as  the sole basis for treatment or other patient management decisions. A negative result may occur with  improper specimen collection/handling, submission of specimen other than nasopharyngeal swab, presence of viral mutation(s) within the areas targeted by this assay, and inadequate number of viral copies(<138 copies/mL). A negative result must be combined with clinical observations, patient history, and epidemiological information. The expected result is Negative.  Fact Sheet for Patients:  EntrepreneurPulse.com.au  Fact Sheet for Healthcare Providers:  IncredibleEmployment.be  This test is no t yet approved or cleared by the [place] FDA and  has been authorized for detection and/or diagnosis of SARS-CoV-2 by FDA under an Emergency Use Authorization (EUA). This EUA will remain  in effect (meaning this test can be used) for the duration of the COVID-19 declaration under Section 564(b)(1) of the Act, 21 U.S.C.section 360bbb-3(b)(1), unless the authorization is terminated  or revoked sooner.       Influenza A by PCR NEGATIVE NEGATIVE Final   Influenza B by PCR NEGATIVE NEGATIVE Final    Comment: (NOTE) The Xpert Xpress SARS-CoV-2/FLU/RSV plus assay is intended as an aid in the diagnosis of influenza from Nasopharyngeal swab specimens and should not be used as a sole basis for treatment.  Nasal washings and aspirates are unacceptable for Xpert Xpress SARS-CoV-2/FLU/RSV testing.  Fact Sheet for Patients: EntrepreneurPulse.com.au  Fact Sheet for Healthcare Providers: IncredibleEmployment.be  This test is not yet approved or cleared by the [place] FDA and has been authorized for detection and/or diagnosis of SARS-CoV-2 by FDA under an Emergency Use Authorization (EUA). This EUA will remain in effect (meaning this test can be used) for the duration of the COVID-19 declaration under Section 564(b)(1) of the Act, 21 U.S.C. section 360bbb-3(b)(1), unless the authorization is terminated or revoked.  Performed at [facility], [place], [place] [address]   C Difficile Quick Screen w PCR reflex     Status: Abnormal   Collection Time: 09/11/21  3:30 AM   Specimen: STOOL  Result Value Ref Range Status   C Diff antigen POSITIVE (A) NEGATIVE Final   C Diff toxin NEGATIVE NEGATIVE Final   C Diff interpretation Results are indeterminate. See PCR results.  Final    Comment: Performed at [facility], [address] [place], [place] [address]  C. Diff by PCR, Reflexed     Status: Abnormal   Collection Time: 09/11/21  3:30 AM  Result Value Ref Range Status   Toxigenic C. Difficile by PCR POSITIVE (A) NEGATIVE Final    Comment: Positive for toxigenic C. difficile with little to no toxin production. Only treat if clinical presentation suggests symptomatic illness. Performed at [facility], [address] [place], [place] [address]      Total time spend on discharging this patient, including the last patient exam, discussing the hospital stay, instructions for ongoing care as it relates to all pertinent caregivers, as well as preparing the medical discharge records, prescriptions, and/or referrals as applicable, is 35 minutes.    Enzo Bi, MD  [facility] 09/12/2021, 3:02 PM

## 2021-09-14 ENCOUNTER — Telehealth: Payer: Self-pay

## 2021-09-14 ENCOUNTER — Telehealth: Payer: Medicare Other

## 2021-09-14 NOTE — Telephone Encounter (Signed)
Transition Care Management Follow-up Telephone Call Date of discharge and from where: [facility] 09/05/21 How have you been since you were released from the hospital? Feeling better Any questions or concerns? No  Items Reviewed: Did the pt receive and understand the discharge instructions provided? Yes  Medications obtained and verified? Yes  Other? No  Any new allergies since your discharge? No  Dietary orders reviewed? No Do you have support at home? Yes   Home Care and Equipment/Supplies: Were home health services ordered? no Functional Questionnaire: (I = Independent and D = Dependent) ADLs: I  Bathing/Dressing- I  Meal Prep- I  Eating- I  Maintaining continence- I  Transferring/Ambulation- I  Managing Meds- I  Follow up appointments reviewed:  [facility] f/u appt confirmed? Yes  Scheduled to see Dr.Golan on this week. She hasn't called them yet. Are transportation arrangements needed? No  If their condition worsens, is the pt aware to call PCP or go to the Emergency Dept.? Yes Was the patient provided with contact information for the PCP's office or ED? Yes Was to pt encouraged to call back with questions or concerns? Yes

## 2021-09-14 NOTE — Telephone Encounter (Signed)
  Care Management   Follow Up Note   09/14/2021 Name: Stephanie Charles MRN: [id number] DOB: 1930-03-11   Referred by: Jerrol Banana., MD Reason for referral : No chief complaint on file.   Successful contact was made with the patient to discuss care management and care coordination services. Patient declines engagement at this time.  Patient no longer wished to complete initial CCM visit. Patient was made aware to inform clinical team if she changes her mind or wishes to re-engage with the CCM team.  She did want the team to know that she no longer plans to use [place] and will go back to using [facility].   Follow Up Plan: No further follow up required: Patient declined CCM services.  Junius Argyle, PharmD, Para [place], CPP  Clinical Pharmacist Practitioner  [facility] [phone]

## 2021-09-14 NOTE — Progress Notes (Deleted)
Chronic Care Management Pharmacy Note  09/14/2021 Name:  Stephanie Charles MRN:  [id number] DOB:  [birth year]  Summary: ***  Recommendations/Changes made from today's visit: ***  Plan: ***   Subjective: Stephanie Charles is an 87 y.o. year old female who is a primary patient of Stephanie Charles., MD.  The CCM team was consulted for assistance with disease management and care coordination needs.    Engaged with patient by telephone for initial visit in response to provider referral for pharmacy case management and/or care coordination services.   Consent to Services:  The patient was given the following information about Chronic Care Management services today, agreed to services, and gave verbal consent: 1. CCM service includes personalized support from designated clinical staff supervised by the primary care provider, including individualized plan of care and coordination with other care providers 2. 24/7 contact phone numbers for assistance for urgent and routine care needs. 3. Service will only be billed when office clinical staff spend 20 minutes or more in a month to coordinate care. 4. Only one practitioner may furnish and bill the service in a calendar month. 5.The patient may stop CCM services at any time (effective at the end of the month) by phone call to the office staff. 6. The patient will be responsible for cost sharing (co-pay) of up to 20% of the service fee (after annual deductible is met). Patient agreed to services and consent obtained.  Patient Care Team: Stephanie Charles., MD as PCP - General (Family Medicine) Stephanie Charles, Stephanie November, MD as PCP - Cardiology (Cardiology) [facility], [place] as Consulting Physician (Optometry) Stephanie [facility], [facility] (Pharmacist)  Recent office visits: 09/09/2021 Stephanie Shaggy, LPN (Clinical Support) for Medicare Annual Wellness Exam- No medication changes noted   07/22/2021 Stephanie Aschoff, MD (PCP Office Visit) for Diarrhea- No  medication changes noted, lab order placed, no follow-up noted   06/15/2021 Stephanie Aschoff, MD (PCP Office Visit) for Follow-up- No medication changes noted, no orders placed, patient instructed to return in 4 months   04/13/2021 Stephanie Aschoff, MD (PCP Office Visit) for [facility] Follow-up- No medication changes noted, no orders placed, patient instructed to follow-up in 2 months  Recent consult visits: 04/09/2021 Stephanie Charles, Stephanie [facility] ([facility] Failure [facility]) for CHF- No Medication Changes noted, No orders placed, patient instructed to return in 2 months   03/24/2021 Stephanie Rogue, MD (Cardiology) for [facility] Follow-up- No Medication changes noted, Lab order placed, EKG 12-Lead placed, patient instructed to follow-up in 1 year  [facility] visits: 09/10/21: Patient hospitalized due to syncope, AKI. Stop amlodipine, HCTZ, and torsemide. Potassium for 7 days.   Admitted to the [facility] on 06/16/2021 due to Back Pain. Discharge date was 06/16/2021. Discharged from [facility] Department   New?Medications Started at [facility] Discharge:?? -Started None ID   Medication Changes at [facility] Discharge: CHANGE None ID   Medications Discontinued at [facility] Discharge: -Stopped None ID   Medications that remain the same after [facility] Discharge:??  -All other medications will remain the same.     Medication Reconciliation was completed by comparing discharge summary, patient's EMR and Pharmacy list, and upon discussion with patient.   Admitted to the [facility] on 03/11/2021 due to Vomiting and Diarrhea. Discharge date was 03/13/2021. Discharged from [facility]?Medications Started at [facility] Discharge:?? -Started None ID   Medication Changes at [facility] Discharge: CHANGE how you take:-allopurinol 300 MG tablet Take 1 tablet by mouth once 1 daily  Signed by: Stephanie Durie, MD You were taking this medication differently than prescribed    Medications Discontinued at [facility] Discharge: -Stopped None ID   Medications that remain the same after [facility] Discharge:??  -All other medications will remain the same.     Objective:  Lab Results  Component Value Date   CREATININE 1.39 (H) 09/12/2021   BUN 39 (H) 09/12/2021   GFRNONAA 36 (L) 09/12/2021   GFRAA 52 (L) 08/21/2020   NA 142 09/12/2021   K 3.1 (L) 09/12/2021   CALCIUM 8.7 (L) 09/12/2021   CO2 24 09/12/2021   GLUCOSE 105 (H) 09/12/2021    Lab Results  Component Value Date/Time   HGBA1C 5.6 03/07/2021 05:14 AM   HGBA1C 5.5 10/19/2011 03:23 AM    Last diabetic Eye exam: No results found for: HMDIABEYEEXA  Last diabetic Foot exam: No results found for: HMDIABFOOTEX   Lab Results  Component Value Date   CHOL 118 03/07/2021   HDL 33 (L) 03/07/2021   LDLCALC 68 03/07/2021   TRIG 83 03/07/2021   CHOLHDL 3.6 03/07/2021    Hepatic Function Latest Ref Rng & Units 09/10/2021 07/22/2021 03/12/2021  Total Protein 6.5 - 8.1 g/dL 6.5 6.6 4.9(L)  Albumin 3.5 - 5.0 g/dL 3.9 4.7(H) 3.0(L)  AST 15 - 41 U/L 27 17 35  ALT 0 - 44 U/L '16 12 13  ' Alk Phosphatase 38 - 126 U/L 81 117 55  Total Bilirubin 0.3 - 1.2 mg/dL 0.8 0.5 1.3(H)    Lab Results  Component Value Date/Time   TSH 0.052 (L) 03/13/2021 05:27 AM   TSH 1.110 05/19/2020 03:35 PM   TSH 0.432 06/06/2019 11:26 AM   TSH 1.630 03/16/2018 12:20 PM   FREET4 1.25 (H) 03/11/2021 06:30 PM    CBC Latest Ref Rng & Units 09/12/2021 09/11/2021 09/10/2021  WBC 4.0 - 10.5 K/uL 7.4 9.7 10.7(H)  Hemoglobin 12.0 - 15.0 g/dL 11.2(L) 10.7(L) 11.8(L)  Hematocrit 36.0 - 46.0 % 33.2(L) 31.6(L) 34.9(L)  Platelets 150 - 400 K/uL 125(L) 135(L) 159    No results found for: VD25OH  Clinical ASCVD: Yes  The ASCVD Risk score (Arnett DK, et al., 2019) failed to calculate for the following reasons:   The 2019 ASCVD risk score is only valid for ages 52 to 30   The patient has a prior MI or stroke diagnosis    Depression screen  [facility] 2/9 08/21/2020 05/19/2020 01/18/2019  Decreased Interest 0 0 0  Down, Depressed, Hopeless 0 0 0  PHQ - 2 Score 0 0 0  Altered sleeping 2 3 -  Tired, decreased energy 0 0 -  Change in appetite 0 0 -  Feeling bad or failure about yourself  0 0 -  Trouble concentrating 0 0 -  Moving slowly or fidgety/restless 0 0 -  Suicidal thoughts 0 0 -  PHQ-9 Score 2 3 -  Difficult doing work/chores Not difficult at all Not difficult at all -    Social History   Tobacco Use  Smoking Status Never  Smokeless Tobacco Never   BP Readings from Last 3 Encounters:  09/12/21 (!) 168/65  07/22/21 (!) 151/86  06/15/21 133/65   Pulse Readings from Last 3 Encounters:  09/12/21 91  07/22/21 85  06/15/21 86   Wt Readings from Last 3 Encounters:  09/10/21 155 lb (70.3 kg)  07/22/21 161 lb (73 kg)  06/15/21 164 lb (74.4 kg)   BMI Readings from Last 3 Encounters:  09/10/21 28.35 kg/m  07/22/21  29.45 kg/m  06/15/21 30.00 kg/m    Assessment/Interventions: Review of patient past medical history, allergies, medications, health status, including review of consultants reports, laboratory and other test data, was performed as part of comprehensive evaluation and provision of chronic care management services.   SDOH:  (Social Determinants of Health) assessments and interventions performed: Yes  SDOH Screenings   Alcohol Screen: Not on file  Depression (PHQ2-9): Not on file  Financial Resource Strain: Not on file  Food Insecurity: Not on file  Housing: Not on file  Physical Activity: Not on file  Social Connections: Not on file  Stress: Not on file  Tobacco Use: Low Risk    Smoking Tobacco Use: Never   Smokeless Tobacco Use: Never   Passive Exposure: Not on file  Transportation Needs: Not on file    CCM Care Plan  Allergies  Allergen Reactions   Latex Itching   Levofloxacin     Mouth sores   Lisinopril     Cough?   Povidone-Iodine Other (See Comments)    Severe blistering and  itchiness. Severe redness   Simvastatin     Myalgias   Codeine Nausea And Vomiting    Medications Reviewed Today     Reviewed by Arby Barrette, CPhT (Pharmacy Technician) on 09/10/21 at [place] List Status: Complete   Medication Order Taking? Sig Documenting Provider Last Dose Status Informant  albuterol (PROVENTIL) (2.5 MG/3ML) 0.083% nebulizer solution [id number] No USE 1 VIAL IN NEBULIZER EVERY 4 HOURS AS NEEDED Stephanie [facility]., MD prn unknown Active Child  allopurinol (ZYLOPRIM) 300 MG tablet [id number] Yes NEW PRESCRIPTION REQUEST: TAKE ONE TABLET BY MOUTH EVERY DAY Stephanie [facility]., MD 09/10/2021 am Active Child  amitriptyline (ELAVIL) 10 MG tablet [id number] Yes TAKE 1 TABLET BY MOUTH AT BEDTIME Stephanie [facility]., MD 09/09/2021 pm Active Child  amLODipine (NORVASC) 10 MG tablet [id number] Yes NEW PRESCRIPTION REQUEST: TAKE ONE TABLET BY MOUTH EVERY DAY Stephanie [facility]., MD 09/10/2021 am Active Child  ASPIRIN LOW DOSE 81 MG EC tablet [id number] Yes NEW PRESCRIPTION REQUEST: TAKE ONE TABLET BY MOUTH EVERY DAY Stephanie [facility]., MD 09/10/2021 am Active Child  atorvastatin (LIPITOR) 40 MG tablet [id number] Yes NEW PRESCRIPTION REQUEST: TAKE ONE TABLET BY MOUTH EVERY DAY Stephanie [facility]., MD 09/09/2021 pm Active Child  carvedilol (COREG) 12.5 MG tablet [id number] No Take 1 tablet (12.5 mg total) by mouth 2 (two) times daily. Virginia Crews, MD 09/08/2021 pm Active Child  clopidogrel (PLAVIX) 75 MG tablet [id number] Yes NEW PRESCRIPTION REQUEST: TAKE ONE TABLET BY MOUTH EVERY DAY Stephanie [facility]., MD 09/10/2021 am Active Child  Ferrous Sulfate 27 MG TABS [id number] Yes Take 27 mg by mouth at bedtime. [provider] 09/09/2021 pm Active Child  hydrochlorothiazide (HYDRODIURIL) 25 MG tablet [id number] Yes Take 25 mg by mouth daily. [provider] 09/10/2021 am Active Child  hydrocortisone 2.5 % cream [id number] No Apply 1 application  topically 2 (two) times daily as needed. [provider] prn unknown Active Child  isosorbide dinitrate (ISORDIL) 30 MG tablet [id number] Yes NEW PRESCRIPTION REQUEST: TAKE ONE TABLET BY MOUTH EVERY DAY Stephanie [facility]., MD 09/10/2021 am Active Child  isosorbide mononitrate (IMDUR) 30 MG 24 hr tablet [id number] No Take 1 tablet (30 mg total) by mouth daily.  Patient not taking: Reported on 09/10/2021   Stephanie Charles., MD Not Taking Active Child  levothyroxine (SYNTHROID) 137 MCG tablet [id number] Yes  NEW PRESCRIPTION REQUEST: TAKE ONE TABLET BY MOUTH EVERY DAY IN THE MORNING Stephanie [facility]., MD 09/10/2021 am Active Child  Melatonin 10 MG TABS [id number] Yes NEW PRESCRIPTION REQUEST: TAKE ONE TABLET BY MOUTH AT BEDTIME Stephanie [facility]., MD 09/09/2021 pm Active Child  mirtazapine (REMERON) 30 MG tablet [id number] Yes NEW PRESCRIPTION REQUEST: TAKE ONE TABLET BY MOUTH AT BEDTIME Stephanie [facility]., MD 09/09/2021 pm Active Child  Multiple Vitamin (MULTIVITAMIN) capsule [id number] Yes Take 1 capsule by mouth every evening. [provider] 09/09/2021 pm Active Child  pantoprazole (PROTONIX) 40 MG tablet [id number] Yes NEW PRESCRIPTION REQUEST: TAKE ONE TABLET BY MOUTH EVERY DAY Stephanie [facility]., MD 09/10/2021 am Active Child  sacubitril-valsartan (ENTRESTO) 49-51 MG [id number] Yes Take 1 tablet by mouth 2 (two) times daily. [name], MD 09/10/2021 am Active Child  sertraline (ZOLOFT) 25 MG tablet [id number] Yes NEW PRESCRIPTION REQUEST: TAKE ONE TABLET BY MOUTH EVERY DAY Stephanie [facility]., MD 09/10/2021 am Active Child  torsemide (DEMADEX) 20 MG tablet [id number] Yes NEW PRESCRIPTION REQUEST: TAKE ONE TABLET BY MOUTH EVERY DAY Stephanie [facility]., MD 09/10/2021 am Active Child  zolpidem (AMBIEN CR) 6.25 MG CR tablet [id number] No Take 1 tablet (6.25 mg total) by mouth at bedtime as needed for sleep.  Patient not taking: Reported on 09/10/2021   Max Sane,  MD Not Taking Active Child           Med Note Kerin Perna Mar 24, 2021  9:53 AM)    Med List Note Arby Barrette, [place] 09/10/21 1709): Marland Kitchen            Patient Active Problem List   Diagnosis Date Noted   Near syncope 09/10/2021   Hypotension 03/13/2021   Vomiting and diarrhea 03/11/2021   Pressure injury of skin 03/11/2021   Shortness of breath    Acute on chronic combined systolic and diastolic CHF (congestive heart failure) ([place]) 03/06/2021   Acute on chronic combined systolic and diastolic congestive heart failure ([place]) 03/06/2021   COVID-19 virus infection 03/06/2021   Hypothyroidism    TIA (transient ischemic attack)    CAD (coronary artery disease)    CKD (chronic kidney disease), stage IIIa    Elevated troponin    Unstable angina ([place]) 06/07/2019   Non-ST elevation (NSTEMI) myocardial infarction [facility])    Chest pain 06/06/2019   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   Diverticulitis 04/13/2018   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 11/12/2017   Ankle edema, bilateral 05/04/2017   Abnormal blood sugar 03/11/2015   Allergic rhinitis 03/11/2015   Absolute anemia 03/11/2015   Arthropathia 03/11/2015   Benign essential HTN 03/11/2015   Benign essential tremor 03/11/2015   Artery disease, cerebral 03/11/2015   Chronic venous insufficiency 03/11/2015   Clinical depression 03/11/2015   Urinary system disease 03/11/2015   Acid reflux 03/11/2015   HLD (hyperlipidemia) 03/11/2015   Adult hypothyroidism 03/11/2015   Irritable colon 03/11/2015   Cannot sleep 03/11/2015   Weak pulse 03/11/2015   Carotid stenosis 03/11/2015   Temporary cerebral vascular dysfunction 03/11/2015   External carotid artery stenosis 07/12/2014    Immunization History  Administered Date(s) Administered   Fluad Quad(high Dose 65+) 07/03/2019, 08/21/2020, 06/15/2021   Influenza, High Dose Seasonal PF 07/03/2015, 08/05/2016, 05/25/2017, 07/05/2018    PFIZER(Purple Top)SARS-COV-2 Vaccination 11/25/2019, 12/18/2019, 08/21/2020   Pneumococcal Conjugate-13 07/03/2015   Pneumococcal Polysaccharide-23 06/20/2013   Tdap 02/23/2012  Zoster Recombinat (Shingrix) 07/05/2018   Zoster, Live 08/03/2011    Conditions to be addressed/monitored:  Hypertension, Hyperlipidemia, Heart Failure, GERD, Chronic Kidney Disease, Hypothyroidism, Depression, and Allergic Rhinitis  There are no care plans that you recently modified to display for this patient.    Medication Assistance: {MEDASSISTANCEINFO:25044}  Compliance/Adherence/Medication fill history: Care Gaps: ***  Star-Rating Drugs: ***  Patient's preferred pharmacy is:  [place], [place] - [address] [place]een [place] [address] Phone: [phone] Fax: [place] [address] ([address]), [place] - [place]) [place] [address] Phone: [phone] Fax: [phone]  Uses pill box? {Yes or If no, why not?:20788} Pt endorses ***% compliance  We discussed: {Pharmacy options:24294} Patient decided to: {US Pharmacy Plan:23885}  Care Plan and Follow Up Patient Decision:  {FOLLOWUP:24991}  Plan: {CM FOLLOW UP PTEL:07615}  ***  Current Barriers:  {pharmacybarriers:24917}  Pharmacist Clinical Goal(s):  Patient will {PHARMACYGOALCHOICES:24921} through collaboration with PharmD and provider.   Interventions: 1:1 collaboration with Stephanie [facility]., MD regarding development and update of comprehensive plan of care as evidenced by provider attestation and co-signature Inter-disciplinary care team collaboration (see longitudinal plan of care) Comprehensive medication review performed; medication list updated in electronic medical record  Heart Failure (Goal: manage symptoms and prevent exacerbations) -{US controlled/uncontrolled:25276} -Last ejection  fraction: *** (Date: ***) -HF type: {type of heart failure:30421350} -NYHA Class: {CHL HP Upstream Pharm NYHA Class:[phone]} -AHA HF Stage: {CHL HP Upstream Pharm AHA HF Stage:[phone]} -Current treatment: Carvedilol 12.5 mg twice daily  Imdur 30 mg daily  Entresto 49-51 mg twice daily  -Medications previously tried: ***  -Amlodipine, HCTZ, Torsemide on hold following hospitalization on 12/10 due to hypotension.  -Current home BP/HR readings: *** -Current dietary habits: *** -Current exercise habits: *** -Educated on {CCM HF Counseling:25125} -{CCMPHARMDINTERVENTION:25122}   Hyperlipidemia: (LDL goal < 70) -{US controlled/uncontrolled:25276} -History of TIA  -Current treatment: Atorvastatin 40 mg daily  -Current treatment: Aspirin 81 mg daily  Clopidogrel 75 mg daily  -Medications previously tried: ***  -Current dietary patterns: *** -Current exercise habits: *** -Educated on {CCM HLD Counseling:25126} -{CCMPHARMDINTERVENTION:25122}  Depression/Anxiety (Goal: ***) -{US controlled/uncontrolled:25276} -Current treatment: Amitriptyline 10 mg nightly  Mirtazapine 30 mg nightly  Sertraline 25 mg daily  -Medications previously tried/failed: *** -PHQ9: *** -GAD7: *** -Connected with *** for mental health support -Educated on {CCM mental health counseling:25127} -{CCMPHARMDINTERVENTION:25122}  *** (Goal: ***) -{US controlled/uncontrolled:25276} -Current treatment  Pantoprazole 40 mg daily  -Medications previously tried: ***  -{CCMPHARMDINTERVENTION:25122}  Gout (Goal: Prevent gout flares) -{US controlled/uncontrolled:25276} -Last Gout Flare: *** -Current treatment  Allopurinol 300 mg daily  -Medications previously tried: ***  -We discussed:  {gout counseling:24106} -{CCMPHARMDINTERVENTION:25122}  Patient Goals/Self-Care Activities Patient will:  - {pharmacypatientgoals:24919}  Follow Up Plan: {CM FOLLOW UP HIDU:37357}

## 2021-09-14 NOTE — Progress Notes (Signed)
Cardiology Office Note    Date:  09/15/2021   ID:  Stephanie Charles, DOB [birth year], MRN [id number]  PCP:  Jerrol Banana., MD  Cardiologist:  Ida Rogue, MD  Electrophysiologist:  None   Chief Complaint: Hospital follow-up  History of Present Illness:   Stephanie Charles is a 87 y.o. female with history of CAD with prior NSTEMI in 2016 as detailed below with NSTEMI in 06/2019 status post PCI/DES to the LAD, HFrEF secondary to ICM with subsequent recovery in LV systolic function to low normal by echo in 03/2021, hypertensive heart disease with labile BP, syncope, TIA/posterior CVA, LBBB, COVID in 03/2021 requiring hospital admission, mild bilateral carotid artery disease, hypothyroidism, diverticulitis, C. difficile, CKD stage II, anemia, gout, anxiety, depression, and GERD who presents for hospital follow-up as outlined below.    She was previously followed by Dr. Ubaldo Glassing, though transitioned her care to [facility] during her 06/2019 admission.  Prior Holter from 2017 showed NSR with occasional OVCs and PACs with NSVT/WCT without sustained arrhythmias. Stress testing in 12/2016 was negative for significant ischemia with normal LVSF. Echo in 12/2016 showed normal LVSF with an EF of 55% and mild MR/TR.    She was admitted [facility] in 06/2019 with chest pain and palpitations in the setting of poorly controlled BP with systolic in the 253G mmHg. Echo showed an EF of 35-40%, diffuse HK, normal RVSF and cavity size, moderately dilated left atrium. During the study, frequent PVCs were noted. High-sensitivity troponin peaked at 296. She underwent R/LHC that showed mid LAD 95% stenosis s/p successful PCI/DES, D2 30% stenosis. RHC showed mean RA pressure of 8, RV pressure 32/7 with a mean of 9, PA pressure 31/16 with a mean of 21, PCWP 10, CO/CI 3.57/2.02.  Echo in 09/2019 demonstrated an EF of 40 to 45%, moderate LVH, abnormal septal wall motion consistent with known LBBB, grade 1 diastolic dysfunction,  global hypokinesis, moderately reduced RV systolic function with normal ventricular cavity size and mildly increased wall thickness, moderately dilated left atrium, trivial mitral regurgitation, mild pulmonic regurgitation, and an estimated right atrial pressure of 8 mmHg.  Carotid artery ultrasound from 07/2020 showed 1 to 39% bilateral ICA stenosis.  She was admitted to the hospital in 03/2021 twice, initially from 6/3 through 6/7 with COVID-pneumonia and possible acute on chronic combined systolic and diastolic CHF in the context of accelerated hypertension.  BNP elevated at 960.  Echo demonstrated an improved LV systolic function with an EF of 50 to 55%, moderate LVH, normal RV systolic function and ventricular cavity size, moderately dilated left atrium, and mild aortic valve sclerosis without evidence of stenosis.  Following her discharge on 6/7, she was readmitted on 6/8 through 6/10 with nausea, vomiting, and diarrhea with associated syncope while sitting on the toilet.  Note indicates that she was bradycardic into the 40s bpm and hypotensive in the ER.  She was given 1 dose of atropine.  She had AKI and hypotension which was felt to be due to volume depletion.  She was treated with IV fluids, antiemetics, and steroids.  Allergy was not consulted during these admissions.  She was most recently admitted to [facility] from 12/8 through 12/10 with an episode of near syncope.  She had been under increased stress as she had recently traveled to [address] to visit with one of her daughters who is transitioning to hospice in the setting of glioblastoma.  She reported multiple episodes of diarrhea.  In the ED, BP was  soft at 109/51.  She was hypokalemic with a potassium of 3.3 trending further to 2.5.  She had acute on CKD with a BUN of 60 and serum creatinine 2.69 (baseline serum creatinine 1.4-1.6), PCR positive for C. difficile, high-sensitivity troponin minimally elevated at 36x2.  CT head showed no acute  intracranial pathology.  Lower extremity ultrasound was negative for DVT bilaterally.  Her admission was notable for an episode of bradycardia following a BM.  Presyncope was felt to be orthostatic in etiology in the setting of volume loss with diarrhea associated with C. difficile.  She was treated by internal medicine.  Some of her Home cardiac meds were held including carvedilol, Isordil, Entresto, torsemide, amlodipine, and HCTZ due to hypotension.  She was continued on aspirin, Plavix, and statin.  Discharge summary is not complete for review at this time.  Medication review indicates the patient was advised to hold amlodipine, HCTZ, and torsemide at hospital discharge.  I am unclear if she was advised to continue carvedilol, Entresto, or Imdur based on documentation.  She was advised to take KCl 40 mEq daily for 7 days.  She comes in accompanied by her daughter today and is doing well from a cardiac perspective.  No further near syncopal or syncopal episodes.  No chest pain, dyspnea, palpitations, dizziness, lower extremity swelling, abdominal distention, orthopnea, PND, early satiety.  Since her hospital discharge she has remained on carvedilol 12.5 mg twice daily along with Entresto 49/51 mg twice daily.  This was confirmed with her daughter who prepares her pillbox.  She does continue to have some loose stool, though this is improving.  She reports a long history of diarrhea.   Labs independently reviewed: 09/2021 - magnesium 2.1, potassium 3.1, BUN 39, serum creatinine 1.39, Hgb 11.2, PLT 125 03/2021 - TSH 0.052, free T4 1.25, TC 118, TG 83, HDL 33, LDL 68, A1c 5.6  Past Medical History:  Diagnosis Date   Anemia    Diverticulitis    GERD (gastroesophageal reflux disease)    Hyperlipidemia    Hypertension    Hypothyroidism    Myocardial infarction ([place]) 08/2017   TIA (transient ischemic attack)    1 approx 2015, 1 approx 2018   Vertigo    last episode several months ago   Wears hearing  aid in left ear     Past Surgical History:  Procedure Laterality Date   COLONOSCOPY WITH PROPOFOL N/A 06/22/2018   Procedure: COLONOSCOPY WITH PROPOFOL;  Surgeon: Jonathon Bellows, MD;  Location: [facility] ENDOSCOPY;  Service: Endoscopy;  Laterality: N/A;   CORONARY STENT INTERVENTION N/A 06/07/2019   Procedure: CORONARY STENT INTERVENTION;  Surgeon: [name], MD;  Location: [facility] CV LAB;  Service: Cardiovascular;  Laterality: N/A;   ESOPHAGOGASTRODUODENOSCOPY (EGD) WITH PROPOFOL N/A 06/22/2018   Procedure: ESOPHAGOGASTRODUODENOSCOPY (EGD) WITH PROPOFOL;  Surgeon: Jonathon Bellows, MD;  Location: [facility] ENDOSCOPY;  Service: Endoscopy;  Laterality: N/A;   KNEE ARTHROSCOPY Right    REPLACEMENT TOTAL KNEE BILATERAL     RIGHT/LEFT HEART CATH AND CORONARY ANGIOGRAPHY N/A 06/07/2019   Procedure: RIGHT/LEFT HEART CATH AND CORONARY ANGIOGRAPHY;  Surgeon: [name], MD;  Location: [place] CV LAB;  Service: Cardiovascular;  Laterality: N/A;   SHOULDER SURGERY Left     Current Medications: Current Meds  Medication Sig   albuterol (PROVENTIL) (2.5 MG/3ML) 0.083% nebulizer solution USE 1 VIAL IN NEBULIZER EVERY 4 HOURS AS NEEDED   allopurinol (ZYLOPRIM) 300 MG tablet NEW PRESCRIPTION REQUEST: TAKE ONE TABLET BY MOUTH EVERY  DAY   amitriptyline (ELAVIL) 10 MG tablet TAKE 1 TABLET BY MOUTH AT BEDTIME   ASPIRIN LOW DOSE 81 MG EC tablet NEW PRESCRIPTION REQUEST: TAKE ONE TABLET BY MOUTH EVERY DAY   atorvastatin (LIPITOR) 40 MG tablet NEW PRESCRIPTION REQUEST: TAKE ONE TABLET BY MOUTH EVERY DAY   carvedilol (COREG) 12.5 MG tablet Take 1 tablet (12.5 mg total) by mouth 2 (two) times daily.   clopidogrel (PLAVIX) 75 MG tablet NEW PRESCRIPTION REQUEST: TAKE ONE TABLET BY MOUTH EVERY DAY   Ferrous Sulfate 27 MG TABS Take 27 mg by mouth at bedtime.   hydrocortisone 2.5 % cream Apply 1 application topically 2 (two) times daily as needed.   isosorbide dinitrate (ISORDIL) 30 MG tablet NEW PRESCRIPTION  REQUEST: TAKE ONE TABLET BY MOUTH EVERY DAY   levothyroxine (SYNTHROID) 137 MCG tablet NEW PRESCRIPTION REQUEST: TAKE ONE TABLET BY MOUTH EVERY DAY IN THE MORNING   Melatonin 10 MG TABS Take 20 mg by mouth at bedtime.   mirtazapine (REMERON) 30 MG tablet NEW PRESCRIPTION REQUEST: TAKE ONE TABLET BY MOUTH AT BEDTIME   Multiple Vitamin (MULTIVITAMIN) capsule Take 1 capsule by mouth every evening.   pantoprazole (PROTONIX) 40 MG tablet NEW PRESCRIPTION REQUEST: TAKE ONE TABLET BY MOUTH EVERY DAY   potassium chloride SA (KLOR-CON M) 20 MEQ tablet Take 2 tablets (40 mEq total) by mouth daily for 7 days.   sertraline (ZOLOFT) 25 MG tablet NEW PRESCRIPTION REQUEST: TAKE ONE TABLET BY MOUTH EVERY DAY   torsemide (DEMADEX) 20 MG tablet Take 1 tablet (20 mg total) by mouth daily as needed (As needed for shortness of breath, swelling, or weight gain).   vancomycin (VANCOCIN) 125 MG capsule Take 1 capsule (125 mg total) by mouth 4 (four) times daily for 9 days.   [DISCONTINUED] sacubitril-valsartan (ENTRESTO) 49-51 MG Take 1 tablet by mouth 2 (two) times daily.    Allergies:   Latex, Levofloxacin, Lisinopril, Povidone-iodine, Simvastatin, and Codeine   Social History   Socioeconomic History   Marital status: Divorced    Spouse name: na   Number of children: 4   Years of education: 14   Highest education level: Associate degree: occupational, Hotel manager, or vocational program  Occupational History   Occupation: retired  Tobacco Use   Smoking status: Never   Smokeless tobacco: Never  Scientific laboratory technician Use: Never used  Substance and Sexual Activity   Alcohol use: No    Alcohol/week: 0.0 standard drinks   Drug use: No   Sexual activity: Never  Other Topics Concern   Not on file  Social History Narrative   Not on file   Social Determinants of Health   Financial Resource Strain: Not on file  Food Insecurity: Not on file  Transportation Needs: Not on file  Physical Activity: Not on file   Stress: Not on file  Social Connections: Not on file     Family History:  The patient's family history includes Alzheimer's disease in her brother and brother; Breast cancer in her sister; Cancer in her brother; Clotting disorder in her daughter; Fibromyalgia in her daughter; Heart attack in her brother; Heart disease in her brother, brother, father, and mother; Hypertension in her mother; Mental illness in her brother; Stroke in her brother and mother.  ROS:   Review of Systems  Constitutional:  Positive for malaise/fatigue. Negative for chills, diaphoresis, fever and weight loss.  HENT:  Negative for congestion.   Eyes:  Negative for discharge and redness.  Respiratory:  Negative for cough, sputum production, shortness of breath and wheezing.   Cardiovascular:  Negative for chest pain, palpitations, orthopnea, claudication, leg swelling and PND.  Gastrointestinal:  Positive for diarrhea. Negative for abdominal pain, blood in stool, constipation, heartburn, melena, nausea and vomiting.  Musculoskeletal:  Negative for falls and myalgias.  Skin:  Negative for rash.  Neurological:  Positive for weakness. Negative for dizziness, tingling, tremors, sensory change, speech change, focal weakness and loss of consciousness.  Endo/Heme/Allergies:  Does not bruise/bleed easily.  Psychiatric/Behavioral:  Negative for substance abuse. The patient is not nervous/anxious.   All other systems reviewed and are negative.   EKGs/Labs/Other Studies Reviewed:    Studies reviewed were summarized above. The additional studies were reviewed today:  2D echo 03/07/2021: 1. Left ventricular ejection fraction, by estimation, is 50 to 55%. The  left ventricle has low normal function. Left ventricular endocardial  border not optimally defined to evaluate regional wall motion. There is  moderate left ventricular hypertrophy.  Indeterminate diastolic filling due to E-A fusion.   2. Right ventricular systolic  function is normal. The right ventricular  size is normal. Tricuspid regurgitation signal is inadequate for assessing  PA pressure.   3. Left atrial size was moderately dilated.   4. The mitral valve is abnormal. No evidence of mitral valve  regurgitation. No evidence of mitral stenosis.   5. The aortic valve has an indeterminant number of cusps. There is mild  thickening of the aortic valve. Aortic valve regurgitation is not  visualized. Mild aortic valve sclerosis is present, with no evidence of  aortic valve stenosis. __________  Carotid artery ultrasound 07/2020: Summary:  Right Carotid: Velocities in the right ICA are consistent with a 1-39%  stenosis.  Non-hemodynamically significant plaque <50% noted in the  CCA.  The ECA appears >50% stenosed.   Left Carotid: Velocities in the left ICA are consistent with a 1-39%  stenosis.  Non-hemodynamically significant plaque <50% noted in the  CCA.  The ECA appears <50% stenosed.   Vertebrals:  Bilateral vertebral arteries demonstrate antegrade flow.  Subclavians: Left subclavian artery was stenotic. Normal flow hemodynamics  were seen in the right subclavian artery.  __________  2D echo 09/2019: 1. Left ventricular ejection fraction, by visual estimation, is 40 to  45%. The left ventricle has moderately decreased function. There is  moderately increased left ventricular hypertrophy.   2. Abnormal septal motion consistent with left bundle branch block.   3. Left ventricular diastolic parameters are consistent with Grade I  diastolic dysfunction (impaired relaxation).   4. The left ventricle demonstrates global hypokinesis.   5. Global right ventricle has moderately reduced systolic function.The  right ventricular size is normal. Mildly increased right ventricular wall  thickness.   6. Left atrial size was moderately dilated.   7. Right atrial size was not well visualized.   8. Presence of pericardial fat pad.   9. The mitral  valve was not well visualized. Trivial mitral valve  regurgitation. No evidence of mitral stenosis.  10. The tricuspid valve is not well visualized. Tricuspid valve  regurgitation is not demonstrated.  11. The aortic valve is tricuspid. Aortic valve regurgitation is not  visualized. No evidence of aortic valve sclerosis or stenosis.  12. Pulmonic regurgitation is mild.  13. The pulmonic valve was not well visualized. Pulmonic valve  regurgitation is mild.  14. TR signal is inadequate for assessing pulmonary artery systolic  pressure.  15. The inferior vena cava is dilated in size with >  50% respiratory  variability, suggesting right atrial pressure of 8 mmHg.  16. The interatrial septum was not well visualized. __________  [facility] with PCI in 06/07/2019: Mid LAD lesion is 95% stenosed. Post intervention, there is a 0% residual stenosis. A drug-eluting stent was successfully placed using a STENT RESOLUTE ONYX 2.5X22. 2nd Diag lesion is 30% stenosed.   Successful angioplasty and drug-eluting stent placement to the mid LAD.   Recommendations: Dual antiplatelet therapy for at least 1 year. Blood pressure control and cardiac rehab.  Continue treatment of risk factors.    Coronary angiography:  Coronary dominance: Right  Left mainstem:  Large vessel that bifurcates into the LAD and left circumflex, no significant disease noted  Left anterior descending (LAD):   Large vessel that extends to the apical region, diagonal branch 2 of moderate size, proximal to mid LAD prior to large diagonal with 90% stenosis, hazy, heavily calcified.  Left circumflex (LCx):  Large vessel with OM branch 2, no significant disease noted, mild to moderate proximal to mid disease estimated 40%  Right coronary artery (RCA):  Right dominant vessel with PL and PDA, no significant disease noted, very mild proximal disease estimated 30%  Left ventriculography: Aortic valve crossed for pressures, no significant  there is no significant mitral regurgitation , no significant aortic valve stenosis Frequent ectopy noted  RA pressures mean of 8 RV pressure 32/7 mean 9 PA pressure 31/16 mean 21 Wedge pressure 10 Cardiac output 3.57 Cardiac index 2.02  Final Conclusions:   Culprit vessel for presentation is the proximal to mid LAD estimated at 90% Case discussed with Dr. Fletcher Anon, he will attempt PCI   Recommendations:  pci as above Cardiomyopathy meds, will make changes __________  2D echo 06/2019: 1. The left ventricle has moderately reduced systolic function, with an  ejection fraction of 35-40%. The cavity size was normal. There is  moderately increased left ventricular wall thickness. Left ventricular  diastolic Doppler parameters are  indeterminate. Left ventricular diffuse hypokinesis.   2. The right ventricle has normal systolic function. The cavity was  normal. There is no increase in right ventricular wall thickness.Unable to  estimate RVSP   3. Frequent PVCs.   4. Left atrial size was moderately dilated.    EKG:  EKG is ordered today.  The EKG ordered today demonstrates NSR, 68 bpm, first-degree AV block, LBBB (known)  Recent Labs: 03/06/2021: B Natriuretic Peptide 960.1 03/13/2021: TSH 0.052 09/10/2021: ALT 16 09/12/2021: BUN 39; Creatinine, Ser 1.39; Hemoglobin 11.2; Magnesium 2.1; Platelets 125; Potassium 3.1; Sodium 142  Recent Lipid Panel    Component Value Date/Time   CHOL 118 03/07/2021 0514   CHOL 115 03/13/2020 1440   CHOL 120 10/19/2011 0323   TRIG 83 03/07/2021 0514   TRIG 152 10/19/2011 0323   HDL 33 (L) 03/07/2021 0514   HDL 29 (L) 03/13/2020 1440   HDL 15 (L) 10/19/2011 0323   CHOLHDL 3.6 03/07/2021 0514   VLDL 17 03/07/2021 0514   VLDL 30 10/19/2011 0323   LDLCALC 68 03/07/2021 0514   LDLCALC 53 03/13/2020 1440   LDLCALC 75 10/19/2011 0323    PHYSICAL EXAM:    VS:  BP 124/60 (BP Location: Left Arm, Patient Position: Sitting, Cuff Size: Normal)   Pulse  68   Ht 5\' 2"  (1.575 m)   Wt 162 lb (73.5 kg)   SpO2 97%   BMI 29.63 kg/m   BMI: Body mass index is 29.63 kg/m.  Physical Exam Vitals reviewed.  Constitutional:  Appearance: She is well-developed.  HENT:     Head: Normocephalic and atraumatic.  Eyes:     General:        Right eye: No discharge.        Left eye: No discharge.  Neck:     Vascular: No JVD.  Cardiovascular:     Rate and Rhythm: Normal rate and regular rhythm.     Pulses:          Carotid pulses are  on the right side with bruit.      Posterior tibial pulses are 2+ on the right side and 2+ on the left side.     Heart sounds: Normal heart sounds, S1 normal and S2 normal. Heart sounds not distant. No midsystolic click and no opening snap. No murmur heard.   No friction rub.  Pulmonary:     Effort: Pulmonary effort is normal. No respiratory distress.     Breath sounds: Normal breath sounds. No decreased breath sounds, wheezing or rales.  Chest:     Chest wall: No tenderness.  Abdominal:     General: There is no distension.     Palpations: Abdomen is soft.     Tenderness: There is no abdominal tenderness.  Musculoskeletal:     Cervical back: Normal range of motion.     Right lower leg: No edema.     Left lower leg: No edema.  Skin:    General: Skin is warm and dry.     Nails: There is no clubbing.  Neurological:     Mental Status: She is alert and oriented to person, place, and time.  Psychiatric:        Speech: Speech normal.        Behavior: Behavior normal.        Thought Content: Thought content normal.        Judgment: Judgment normal.    Wt Readings from Last 3 Encounters:  09/15/21 162 lb (73.5 kg)  09/10/21 155 lb (70.3 kg)  07/22/21 161 lb (73 kg)     ASSESSMENT & PLAN:   Near syncope with history of syncope: Episodes have been felt to be related to volume loss with hypotension and dehydration occurring in the context of diarrhea, most recently with C. difficile colitis.  She does have  a history of LBBB.  There have also been reports of bradycardia, possibly in the context of increased vagal tone.  Place ZIO AT.  Should she have recurrent episodes not in the context of volume depletion, or based on ZIO results, would have a low threshold to have her evaluated by EP.  CAD involving the native coronary arteries without angina and mildly elevated high-sensitivity troponin: She is doing well without symptoms concerning for angina.  She remains on ASA and clopidogrel.  Minimally elevated high-sensitivity troponin likely in the setting of demand ischemia secondary to C. difficile colitis, dehydration and AKI.  She remains on aspirin, atorvastatin, and carvedilol.  Consider resumption of Imdur in follow-up based on blood pressure trend.  Chronic combined systolic and diastolic CHF with subsequent recovery of LV systolic function/ICM: She appears euvolemic and well compensated.  She continues to have ongoing volume loss through diarrhea, though this is improving.  She remains on carvedilol and Entresto.  We will resume torsemide at 20 mg daily as needed for lower extremity swelling, dyspnea, or weight gain greater than 3 pounds overnight.  We will attempt to avoid scheduled daily torsemide in an effort to avoid dehydration  and significant hypotension contributing to near syncope given ongoing volume loss through diarrhea.  Labile hypertension: Blood pressure is well controlled at triage at 124/60.  Orthostatics could not be obtained due to generalized malaise, fatigue, and weakness.  She remains on carvedilol 12.5 mg twice daily and Entresto 49/51 mg twice daily as outlined above.  Carotid artery disease with right-sided carotid bruit: Carotid artery ultrasound in 07/2020 demonstrated 1 to 39% bilateral ICA stenoses.  She remains on aspirin and atorvastatin.  Update carotid artery ultrasound.  HLD: LDL 68.  She remains on atorvastatin 40 mg.  CKD stage II: Patient was noted to have AKI during  her recent admission which was felt to be due to volume depletion in the context of profuse watery diarrhea.  With IV hydration, renal function had returned back to baseline upon hospital discharge.  Hypokalemia/hypomagnesemia: Discharge potassium 3.1 with magnesium being 2.1.  Electrolyte derangements likely in the setting of volume loss with diarrhea.  She was discharged with oral potassium repletion.  Check BMP.  C. difficile/diarrhea with ongoing generalized malaise and weakness: Diarrhea has been a chronic issue for her.  Encouraged her to follow-up with PCP/GI for further management.  We may need to limit torsemide usage given ongoing volume loss through diarrhea.  Abnormal thyroid function: Follow-up with PCP.  This was not discussed in detail at today's visit.   Disposition: F/u with Dr. Rockey Situ or an APP in 6 weeks.   Medication Adjustments/Labs and Tests Ordered: Current medicines are reviewed at length with the patient today.  Concerns regarding medicines are outlined above. Medication changes, Labs and Tests ordered today are summarized above and listed in the Patient Instructions accessible in Encounters.   Signed, Christell Faith, PA-C 09/15/2021 1:14 PM     [address] [place], [place] [address] [phone]

## 2021-09-15 ENCOUNTER — Encounter: Payer: Self-pay | Admitting: Physician Assistant

## 2021-09-15 ENCOUNTER — Other Ambulatory Visit: Payer: Self-pay

## 2021-09-15 ENCOUNTER — Ambulatory Visit (INDEPENDENT_AMBULATORY_CARE_PROVIDER_SITE_OTHER): Payer: Medicare Other

## 2021-09-15 ENCOUNTER — Ambulatory Visit (INDEPENDENT_AMBULATORY_CARE_PROVIDER_SITE_OTHER): Payer: Medicare Other | Admitting: Physician Assistant

## 2021-09-15 VITALS — BP 124/60 | HR 68 | Ht 62.0 in | Wt 162.0 lb

## 2021-09-15 DIAGNOSIS — N182 Chronic kidney disease, stage 2 (mild): Secondary | ICD-10-CM

## 2021-09-15 DIAGNOSIS — I5042 Chronic combined systolic (congestive) and diastolic (congestive) heart failure: Secondary | ICD-10-CM | POA: Diagnosis not present

## 2021-09-15 DIAGNOSIS — I2510 Atherosclerotic heart disease of native coronary artery without angina pectoris: Secondary | ICD-10-CM | POA: Diagnosis not present

## 2021-09-15 DIAGNOSIS — R55 Syncope and collapse: Secondary | ICD-10-CM | POA: Diagnosis not present

## 2021-09-15 DIAGNOSIS — I255 Ischemic cardiomyopathy: Secondary | ICD-10-CM

## 2021-09-15 DIAGNOSIS — I6523 Occlusion and stenosis of bilateral carotid arteries: Secondary | ICD-10-CM

## 2021-09-15 DIAGNOSIS — Z87898 Personal history of other specified conditions: Secondary | ICD-10-CM

## 2021-09-15 DIAGNOSIS — E785 Hyperlipidemia, unspecified: Secondary | ICD-10-CM

## 2021-09-15 DIAGNOSIS — R197 Diarrhea, unspecified: Secondary | ICD-10-CM

## 2021-09-15 DIAGNOSIS — R0989 Other specified symptoms and signs involving the circulatory and respiratory systems: Secondary | ICD-10-CM | POA: Diagnosis not present

## 2021-09-15 DIAGNOSIS — I248 Other forms of acute ischemic heart disease: Secondary | ICD-10-CM | POA: Diagnosis not present

## 2021-09-15 DIAGNOSIS — A0472 Enterocolitis due to Clostridium difficile, not specified as recurrent: Secondary | ICD-10-CM

## 2021-09-15 DIAGNOSIS — I25118 Atherosclerotic heart disease of native coronary artery with other forms of angina pectoris: Secondary | ICD-10-CM

## 2021-09-15 DIAGNOSIS — R946 Abnormal results of thyroid function studies: Secondary | ICD-10-CM

## 2021-09-15 DIAGNOSIS — E876 Hypokalemia: Secondary | ICD-10-CM | POA: Diagnosis not present

## 2021-09-15 MED ORDER — ENTRESTO 49-51 MG PO TABS
1.0000 | ORAL_TABLET | Freq: Two times a day (BID) | ORAL | 3 refills | Status: DC
Start: 2021-09-15 — End: 2021-11-19

## 2021-09-15 MED ORDER — TORSEMIDE 20 MG PO TABS: 20.0000 mg | ORAL_TABLET | Freq: Every day | ORAL | 3 refills | Status: DC | PRN

## 2021-09-15 NOTE — Patient Instructions (Signed)
Medication Instructions:  Your physician has recommended you make the following change in your medication:   START Torsemide 20 mg once daily as needed for shortness of breath, swelling, or weight gain  *If you need a refill on your cardiac medications before your next appointment, please call your pharmacy*   Lab Work: BMET today in the office.  If you have labs (blood work) drawn today and your tests are completely normal, you will receive your results only by: [place] (if you have MyChart) OR A paper copy in the mail If you have any lab test that is abnormal or we need to change your treatment, we will call you to review the results.   Testing/Procedures: Your physician has requested that you have a carotid duplex. This test is an ultrasound of the carotid arteries in your neck. It looks at blood flow through these arteries that supply the brain with blood. Allow one hour for this exam. There are no restrictions or special instructions.  Your provider has ordered a heart monitor to wear for 14 days. This will be mailed to your home with instructions on placement. Once you have finished the time frame requested, you will return monitor in box provided.      Follow-Up: At [facility], you and your health needs are our priority.  As part of our continuing mission to provide you with exceptional heart care, we have created designated Provider Care Teams.  These Care Teams include your primary Cardiologist (physician) and Advanced Practice Providers (APPs -  Physician Assistants and Nurse Practitioners) who all work together to provide you with the care you need, when you need it.   Your next appointment:   6 week(s)  The format for your next appointment:   In Person  Provider:   Ida Rogue, MD or Christell Faith, PA-C

## 2021-09-16 ENCOUNTER — Telehealth: Payer: Self-pay

## 2021-09-16 DIAGNOSIS — I5043 Acute on chronic combined systolic (congestive) and diastolic (congestive) heart failure: Secondary | ICD-10-CM

## 2021-09-16 LAB — BASIC METABOLIC PANEL
BUN/Creatinine Ratio: 15 (ref 12–28)
BUN: 18 mg/dL (ref 8–27)
CO2: 23 mmol/L (ref 20–29)
Calcium: 9.5 mg/dL (ref 8.7–10.3)
Chloride: 106 mmol/L (ref 96–106)
Creatinine, Ser: 1.21 mg/dL — ABNORMAL HIGH (ref 0.57–1.00)
Glucose: 116 mg/dL — ABNORMAL HIGH (ref 70–99)
Potassium: 5.5 mmol/L — ABNORMAL HIGH (ref 3.5–5.2)
Sodium: 143 mmol/L (ref 134–144)
eGFR: 43 mL/min/{1.73_m2} — ABNORMAL LOW (ref >59)

## 2021-09-16 NOTE — Telephone Encounter (Signed)
-----   Message from [name], PA-C sent at 09/16/2021  9:55 AM EST ----- Random glucose is ok Renal function is mildly elevated, though consistent with her prior readings Potassium is elevated  Recommendations: -If she is still taking KCl, as directed by the hospitalist service at time of discharge, she should stop  -Hold Entresto for now, will resume following normalization of potassium as her hyperkalemia is likely in the setting of repletion by internal medicine  -Avoid any salt substitutes or seasonings that are high in potassium -Recheck potassium in 24 hours

## 2021-09-16 NOTE — Telephone Encounter (Signed)
Called patient and left a VM requesting a call back. Called patients daughter Neoma Laming and informed her of the recommendations as documented below. She wrote down all instructions and stated that she will get in touch with her mother. She will also have her mother call our office to schedule the lab draw for tomorrow afternoon. Lab order is entered for scheduling in our office.

## 2021-09-16 NOTE — Telephone Encounter (Signed)
Patient and daughter called back and scheduled lab for tomorrow afternoon.

## 2021-09-16 NOTE — Addendum Note (Signed)
Addended by: Raelene Bott, Davine Sweney L on: 09/16/2021 03:54 PM   Modules accepted: Orders

## 2021-09-17 ENCOUNTER — Emergency Department: Payer: Medicare Other

## 2021-09-17 ENCOUNTER — Other Ambulatory Visit: Payer: Medicare Other

## 2021-09-17 ENCOUNTER — Encounter: Payer: Self-pay | Admitting: Emergency Medicine

## 2021-09-17 ENCOUNTER — Emergency Department
Admission: EM | Admit: 2021-09-17 | Discharge: 2021-09-17 | Disposition: A | Discharge status: Home / Self Care | Payer: Medicare Other | Attending: Emergency Medicine | Admitting: Emergency Medicine

## 2021-09-17 ENCOUNTER — Other Ambulatory Visit: Payer: Self-pay

## 2021-09-17 DIAGNOSIS — R0789 Other chest pain: Secondary | ICD-10-CM | POA: Diagnosis not present

## 2021-09-17 DIAGNOSIS — R6889 Other general symptoms and signs: Secondary | ICD-10-CM | POA: Diagnosis not present

## 2021-09-17 DIAGNOSIS — Z8616 Personal history of COVID-19: Secondary | ICD-10-CM | POA: Diagnosis not present

## 2021-09-17 DIAGNOSIS — R1111 Vomiting without nausea: Secondary | ICD-10-CM | POA: Diagnosis not present

## 2021-09-17 DIAGNOSIS — E1122 Type 2 diabetes mellitus with diabetic chronic kidney disease: Secondary | ICD-10-CM | POA: Diagnosis not present

## 2021-09-17 DIAGNOSIS — Z7902 Long term (current) use of antithrombotics/antiplatelets: Secondary | ICD-10-CM | POA: Insufficient documentation

## 2021-09-17 DIAGNOSIS — N183 Chronic kidney disease, stage 3 unspecified: Secondary | ICD-10-CM | POA: Diagnosis not present

## 2021-09-17 DIAGNOSIS — I130 Hypertensive heart and chronic kidney disease with heart failure and stage 1 through stage 4 chronic kidney disease, or unspecified chronic kidney disease: Secondary | ICD-10-CM | POA: Insufficient documentation

## 2021-09-17 DIAGNOSIS — Z79899 Other long term (current) drug therapy: Secondary | ICD-10-CM | POA: Diagnosis not present

## 2021-09-17 DIAGNOSIS — R110 Nausea: Secondary | ICD-10-CM | POA: Diagnosis not present

## 2021-09-17 DIAGNOSIS — R404 Transient alteration of awareness: Secondary | ICD-10-CM | POA: Diagnosis not present

## 2021-09-17 DIAGNOSIS — I5043 Acute on chronic combined systolic (congestive) and diastolic (congestive) heart failure: Secondary | ICD-10-CM | POA: Diagnosis not present

## 2021-09-17 DIAGNOSIS — E039 Hypothyroidism, unspecified: Secondary | ICD-10-CM | POA: Insufficient documentation

## 2021-09-17 DIAGNOSIS — Z7982 Long term (current) use of aspirin: Secondary | ICD-10-CM | POA: Insufficient documentation

## 2021-09-17 DIAGNOSIS — A0472 Enterocolitis due to Clostridium difficile, not specified as recurrent: Secondary | ICD-10-CM | POA: Diagnosis not present

## 2021-09-17 DIAGNOSIS — R079 Chest pain, unspecified: Secondary | ICD-10-CM | POA: Diagnosis not present

## 2021-09-17 DIAGNOSIS — R112 Nausea with vomiting, unspecified: Secondary | ICD-10-CM | POA: Diagnosis present

## 2021-09-17 DIAGNOSIS — Z91040 Latex allergy status: Secondary | ICD-10-CM | POA: Insufficient documentation

## 2021-09-17 DIAGNOSIS — I2510 Atherosclerotic heart disease of native coronary artery without angina pectoris: Secondary | ICD-10-CM | POA: Diagnosis not present

## 2021-09-17 DIAGNOSIS — Z743 Need for continuous supervision: Secondary | ICD-10-CM | POA: Diagnosis not present

## 2021-09-17 DIAGNOSIS — Z955 Presence of coronary angioplasty implant and graft: Secondary | ICD-10-CM | POA: Diagnosis not present

## 2021-09-17 DIAGNOSIS — I10 Essential (primary) hypertension: Secondary | ICD-10-CM | POA: Diagnosis not present

## 2021-09-17 LAB — BASIC METABOLIC PANEL
Anion gap: 7 (ref 5–15)
BUN: 19 mg/dL (ref 8–23)
CO2: 24 mmol/L (ref 22–32)
Calcium: 9.7 mg/dL (ref 8.9–10.3)
Chloride: 108 mmol/L (ref 98–111)
Creatinine, Ser: 1.2 mg/dL — ABNORMAL HIGH (ref 0.44–1.00)
GFR, Estimated: 44 mL/min — ABNORMAL LOW (ref >60)
Glucose, Bld: 118 mg/dL — ABNORMAL HIGH (ref 70–99)
Potassium: 4.3 mmol/L (ref 3.5–5.1)
Sodium: 139 mmol/L (ref 135–145)

## 2021-09-17 LAB — CBC
HCT: 37.5 % (ref 36.0–46.0)
Hemoglobin: 12.5 g/dL (ref 12.0–15.0)
MCH: 31.5 pg (ref 26.0–34.0)
MCHC: 33.3 g/dL (ref 30.0–36.0)
MCV: 94.5 fL (ref 80.0–100.0)
Platelets: 143 10*3/uL — ABNORMAL LOW (ref 150–400)
RBC: 3.97 MIL/uL (ref 3.87–5.11)
RDW: 14.5 % (ref 11.5–15.5)
WBC: 10.5 10*3/uL (ref 4.0–10.5)
nRBC: 0 % (ref 0.0–0.2)

## 2021-09-17 LAB — PROTIME-INR
INR: 1.1 (ref 0.8–1.2)
Prothrombin Time: 13.9 seconds (ref 11.4–15.2)

## 2021-09-17 LAB — HEPATIC FUNCTION PANEL
ALT: 17 U/L (ref 0–44)
AST: 22 U/L (ref 15–41)
Albumin: 3.9 g/dL (ref 3.5–5.0)
Alkaline Phosphatase: 83 U/L (ref 38–126)
Bilirubin, Direct: 0.1 mg/dL (ref 0.0–0.2)
Indirect Bilirubin: 0.8 mg/dL (ref 0.3–0.9)
Total Bilirubin: 0.9 mg/dL (ref 0.3–1.2)
Total Protein: 6.1 g/dL — ABNORMAL LOW (ref 6.5–8.1)

## 2021-09-17 LAB — MAGNESIUM: Magnesium: 1.8 mg/dL (ref 1.7–2.4)

## 2021-09-17 LAB — TROPONIN I (HIGH SENSITIVITY)
Troponin I (High Sensitivity): 22 ng/L — ABNORMAL HIGH (ref <18)
Troponin I (High Sensitivity): 21 ng/L — ABNORMAL HIGH (ref <18)

## 2021-09-17 MED ORDER — FIDAXOMICIN 200 MG PO TABS
200.0000 mg | ORAL_TABLET | Freq: Two times a day (BID) | ORAL | 0 refills | Status: AC
  Filled 2021-09-18: qty 20, 10d supply, fill #0

## 2021-09-17 MED ORDER — LACTATED RINGERS IV BOLUS
500.0000 mL | Freq: Once | INTRAVENOUS | Status: AC
  Administered 2021-09-17: 500 mL via INTRAVENOUS

## 2021-09-17 MED ORDER — FIDAXOMICIN 200 MG PO TABS
200.0000 mg | ORAL_TABLET | Freq: Two times a day (BID) | ORAL | Status: DC
  Administered 2021-09-17: 200 mg via ORAL
  Filled 2021-09-17: qty 1

## 2021-09-17 MED ORDER — ONDANSETRON 4 MG PO TBDP
4.0000 mg | ORAL_TABLET | Freq: Once | ORAL | Status: AC
  Administered 2021-09-17: 4 mg via ORAL
  Filled 2021-09-17: qty 1

## 2021-09-17 MED ORDER — ONDANSETRON HCL 4 MG PO TABS
4.0000 mg | ORAL_TABLET | Freq: Three times a day (TID) | ORAL | 0 refills | Status: DC | PRN
  Filled 2021-09-18: qty 10, 4d supply, fill #0

## 2021-09-17 NOTE — Telephone Encounter (Signed)
Spoke with patients other daughter [name] and she was very upset stating that they will not go to ED where she will sit for hours before being treated. Expressed concerns about patient and her health. Advised that this is a GI issue which needs to be treated and closely monitored. Encouraged her to please call [facility] to see if they can help in some way to manage her at home if at all possible. Provided emotional support as she was upset and just does not want her to decline any further. Reiterated reasons she needs treatment and our recommendations. She verbalized understanding. Provided her with number at [place] and she will reach out to them for further guidance.

## 2021-09-17 NOTE — ED Triage Notes (Signed)
Pt comes into the ED via [facility] from home c/o chest pain, syncopal episodes, and N/V.  Pt has a h/o of 3 MI's with stent placement.   EKG with EMS showed Sinus with multifocal PVC's. Pt is currently being treated for c-diff.   172 CBG 176/89 76 HR 98% RA.

## 2021-09-17 NOTE — Telephone Encounter (Signed)
Torsemide is not causing her diarrhea/vomiting, it is in the same class as furosemide, which she was previously on.  With continued GI volume loss through vomiting/diarrhea, she should stop torsemide in an effort to minimize risk of AKI and volume depletion.  She has a long history of diarrhea of uncertain etiology.  I recommend she contact her PCP/GI for ongoing diarrhea, and vomiting, especially in the context of having just been diagnosed with C diff that required admission.  She may need ED evaluation to maintain adequate hydration and trending of her electrolytes/renal function.  I do not recommend Imodium.  She has previously been seen by Dr. Bonna Gains, at [place] [facility] [facility].  She should call their office as well.  ED precautions.

## 2021-09-17 NOTE — Telephone Encounter (Signed)
Spoke with patients daughter per release form. Reviewed recommendations from provider and expressed importance that she should follow up with someone regarding her symptoms. Given they are not cardiac in nature she should check with primary care provider or her GI doctor. Daughter states she is too weak for her to transport her and advised she may have to call local ems services to assist with taking her to ED. She was very appreciative for the advice given and will discuss with patient reasons for getting care. Provided emotional support because she is very worried and concerned for her mother. She verbalized understanding of our conversation, agreement with plan, and had no further questions at this time.

## 2021-09-17 NOTE — ED Provider Notes (Signed)
[facility] Emergency Department Provider Note  ____________________________________________   Event Date/Time   First MD Initiated Contact with Patient 09/17/21 1516     (approximate)  I have reviewed the triage vital signs and the nursing notes.   HISTORY  Chief Complaint Chest Pain   HPI Stephanie Charles is a 87 y.o. female with a PMH significant for HTN, CAD/MI, TIA, chronic anemia,, HDL, carotid artery disease, CHF, hypothyroidism, and recent admission 12/8-12/10 for syncope secondary to dehydration from C. difficile discharged on vancomycin presents accompanied by daughter for assessment of ongoing nausea vomiting and diarrhea.  Patient also has issues with her potassium and had been discharged on potassium repletion but after having some labs checked 2 days ago that showed hyperkalemia and was told to stop this.  Patient states she feels like her vomiting diarrhea been getting worse.  She is not currently taking any antiemetics.  She denies any fevers or associate abdominal pain or blood in her vomit or stool.  She states that she passed out while in hospital but has not had any passing out episodes since then and has not had any new chest pain, cough, shortness of breath, back pain, urinary symptoms, headache, earache, sore throat, rash or extremity pain or falls since leaving the hospital.  She is worried about her kidney function and her heart because she has history of kidney injury related dehydration when she was initially hospitalized and history of CAD with MIs in the past.  No other acute concerns at this time.         Past Medical History:  Diagnosis Date   Anemia    Diverticulitis    GERD (gastroesophageal reflux disease)    Hyperlipidemia    Hypertension    Hypothyroidism    Myocardial infarction ([place]) 08/2017   TIA (transient ischemic attack)    1 approx 2015, 1 approx 2018   Vertigo    last episode several months ago   Wears hearing  aid in left ear     Patient Active Problem List   Diagnosis Date Noted   Near syncope 09/10/2021   Hypotension 03/13/2021   Vomiting and diarrhea 03/11/2021   Pressure injury of skin 03/11/2021   Shortness of breath    Acute on chronic combined systolic and diastolic CHF (congestive heart failure) ([place]) 03/06/2021   Acute on chronic combined systolic and diastolic congestive heart failure ([place]) 03/06/2021   COVID-19 virus infection 03/06/2021   Hypothyroidism    TIA (transient ischemic attack)    CAD (coronary artery disease)    CKD (chronic kidney disease), stage IIIa    Elevated troponin    Unstable angina ([place]) 06/07/2019   Non-ST elevation (NSTEMI) myocardial infarction [facility])    Chest pain 06/06/2019   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   Diverticulitis 04/13/2018   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 11/12/2017   Ankle edema, bilateral 05/04/2017   Abnormal blood sugar 03/11/2015   Allergic rhinitis 03/11/2015   Absolute anemia 03/11/2015   Arthropathia 03/11/2015   Benign essential HTN 03/11/2015   Benign essential tremor 03/11/2015   Artery disease, cerebral 03/11/2015   Chronic venous insufficiency 03/11/2015   Clinical depression 03/11/2015   Urinary system disease 03/11/2015   Acid reflux 03/11/2015   HLD (hyperlipidemia) 03/11/2015   Adult hypothyroidism 03/11/2015   Irritable colon 03/11/2015   Cannot sleep 03/11/2015   Weak pulse 03/11/2015   Carotid stenosis 03/11/2015   Temporary cerebral vascular dysfunction 03/11/2015  External carotid artery stenosis 07/12/2014    Past Surgical History:  Procedure Laterality Date   COLONOSCOPY WITH PROPOFOL N/A 06/22/2018   Procedure: COLONOSCOPY WITH PROPOFOL;  Surgeon: Jonathon Bellows, MD;  Location: [facility] ENDOSCOPY;  Service: Endoscopy;  Laterality: N/A;   CORONARY STENT INTERVENTION N/A 06/07/2019   Procedure: CORONARY STENT INTERVENTION;  Surgeon: [name], MD;  Location:  [place] CV LAB;  Service: Cardiovascular;  Laterality: N/A;   ESOPHAGOGASTRODUODENOSCOPY (EGD) WITH PROPOFOL N/A 06/22/2018   Procedure: ESOPHAGOGASTRODUODENOSCOPY (EGD) WITH PROPOFOL;  Surgeon: Jonathon Bellows, MD;  Location: [facility] ENDOSCOPY;  Service: Endoscopy;  Laterality: N/A;   KNEE ARTHROSCOPY Right    REPLACEMENT TOTAL KNEE BILATERAL     RIGHT/LEFT HEART CATH AND CORONARY ANGIOGRAPHY N/A 06/07/2019   Procedure: RIGHT/LEFT HEART CATH AND CORONARY ANGIOGRAPHY;  Surgeon: [name], MD;  Location: [place] CV LAB;  Service: Cardiovascular;  Laterality: N/A;   SHOULDER SURGERY Left     Prior to Admission medications   Medication Sig Start Date End Date Taking? Authorizing Provider  fidaxomicin (DIFICID) 200 MG TABS tablet Take 1 tablet (200 mg total) by mouth 2 (two) times daily for 10 days. 09/17/21 09/27/21 Yes Lucrezia Starch, MD  ondansetron (ZOFRAN) 4 MG tablet Take 1 tablet (4 mg total) by mouth every 8 (eight) hours as needed for up to 10 doses for nausea or vomiting. 09/17/21  Yes Lucrezia Starch, MD  albuterol (PROVENTIL) (2.5 MG/3ML) 0.083% nebulizer solution USE 1 VIAL IN NEBULIZER EVERY 4 HOURS AS NEEDED 06/17/21   Jerrol Banana., MD  allopurinol (ZYLOPRIM) 300 MG tablet NEW PRESCRIPTION REQUEST: TAKE ONE TABLET BY MOUTH EVERY DAY 08/03/21   Jerrol Banana., MD  amitriptyline (ELAVIL) 10 MG tablet TAKE 1 TABLET BY MOUTH AT BEDTIME 06/17/21   Jerrol Banana., MD  amLODipine (NORVASC) 10 MG tablet Hold until followup with outpatient provider due to soft blood pressure. Patient not taking: Reported on 09/15/2021 09/12/21   Enzo Bi, MD  ASPIRIN LOW DOSE 81 MG EC tablet NEW PRESCRIPTION REQUEST: TAKE ONE TABLET BY MOUTH EVERY DAY 08/03/21   Jerrol Banana., MD  atorvastatin (LIPITOR) 40 MG tablet NEW PRESCRIPTION REQUEST: TAKE ONE TABLET BY MOUTH EVERY DAY 08/03/21   Jerrol Banana., MD  carvedilol (COREG) 12.5 MG tablet Take 1 tablet  (12.5 mg total) by mouth 2 (two) times daily. 09/10/21   Virginia Crews, MD  clopidogrel (PLAVIX) 75 MG tablet NEW PRESCRIPTION REQUEST: TAKE ONE TABLET BY MOUTH EVERY DAY 07/30/21   Jerrol Banana., MD  Ferrous Sulfate 27 MG TABS Take 27 mg by mouth at bedtime.    [provider]  hydrochlorothiazide (HYDRODIURIL) 25 MG tablet Hold until followup with outpatient provider due to recent acute kidney injury from dehydration. Patient not taking: Reported on 09/15/2021 09/12/21   Enzo Bi, MD  hydrocortisone 2.5 % cream Apply 1 application topically 2 (two) times daily as needed. 02/05/21   [provider]  isosorbide dinitrate (ISORDIL) 30 MG tablet NEW PRESCRIPTION REQUEST: TAKE ONE TABLET BY MOUTH EVERY DAY 07/30/21   Jerrol Banana., MD  levothyroxine (SYNTHROID) 137 MCG tablet NEW PRESCRIPTION REQUEST: TAKE ONE TABLET BY MOUTH EVERY DAY IN THE MORNING 08/03/21   Jerrol Banana., MD  Melatonin 10 MG TABS Take 20 mg by mouth at bedtime.    [provider]  mirtazapine (REMERON) 30 MG tablet NEW PRESCRIPTION REQUEST: TAKE ONE TABLET BY  MOUTH AT BEDTIME 07/30/21   Jerrol Banana., MD  Multiple Vitamin (MULTIVITAMIN) capsule Take 1 capsule by mouth every evening.    [provider]  pantoprazole (PROTONIX) 40 MG tablet NEW PRESCRIPTION REQUEST: TAKE ONE TABLET BY MOUTH EVERY DAY 07/30/21   Jerrol Banana., MD  potassium chloride SA (KLOR-CON M) 20 MEQ tablet Take 2 tablets (40 mEq total) by mouth daily for 7 days. 09/12/21 09/19/21  Enzo Bi, MD  sacubitril-valsartan (ENTRESTO) 49-51 MG Take 1 tablet by mouth 2 (two) times daily. 09/15/21   [name], PA-C  sertraline (ZOLOFT) 25 MG tablet NEW PRESCRIPTION REQUEST: TAKE ONE TABLET BY MOUTH EVERY DAY 07/30/21   Jerrol Banana., MD  torsemide (DEMADEX) 20 MG tablet Take 1 tablet (20 mg total) by mouth daily as needed (As needed for shortness of breath, swelling, or weight  gain). 09/15/21 12/14/21  [name], PA-C    Allergies Latex, Levofloxacin, Lisinopril, Povidone-iodine, Simvastatin, and Codeine  Family History  Problem Relation Age of Onset   Heart disease Mother    Hypertension Mother    Stroke Mother    Heart disease Father    Breast cancer Sister    Alzheimer's disease Brother    Heart attack Brother    Stroke Brother    Heart disease Brother    Mental illness Brother        died suicide   Cancer Brother        prostate   Heart disease Brother        atrial fib   Alzheimer's disease Brother    Clotting disorder Daughter    Fibromyalgia Daughter     Social History Social History   Tobacco Use   Smoking status: Never   Smokeless tobacco: Never  Vaping Use   Vaping Use: Never used  Substance Use Topics   Alcohol use: No    Alcohol/week: 0.0 standard drinks   Drug use: No    Review of Systems  Review of Systems  Constitutional:  Positive for malaise/fatigue and weight loss. Negative for chills and fever.  HENT:  Negative for sore throat.   Eyes:  Negative for pain.  Respiratory:  Negative for cough and stridor.   Cardiovascular:  Negative for chest pain.  Gastrointestinal:  Positive for diarrhea, nausea and vomiting.  Skin:  Negative for rash.  Neurological:  Positive for weakness. Negative for seizures, loss of consciousness and headaches.  Psychiatric/Behavioral:  Negative for suicidal ideas.   All other systems reviewed and are negative.    ____________________________________________   PHYSICAL EXAM:  VITAL SIGNS: ED Triage Vitals  Enc Vitals Group     BP 09/17/21 1328 (!) 122/57     Pulse Rate 09/17/21 1328 72     Resp 09/17/21 1328 20     Temp 09/17/21 1328 98.1 F (36.7 C)     Temp Source 09/17/21 1328 Oral     SpO2 09/17/21 1328 96 %     Weight 09/17/21 1316 162 lb (73.5 kg)     Height 09/17/21 1316 5\' 2"  (1.575 m)     Head Circumference --      Peak Flow --      Pain Score --      Pain Loc --       Pain Edu? --      Excl. in [place]? --    Vitals:   09/17/21 1328  BP: (!) 122/57  Pulse: 72  Resp: 20  Temp: 98.1 F (36.7 C)  SpO2: 96%   Physical Exam Vitals and nursing note reviewed.  Constitutional:      General: She is not in acute distress.    Appearance: She is well-developed.  HENT:     Head: Normocephalic and atraumatic.     Right Ear: External ear normal.     Left Ear: External ear normal.     Nose: Nose normal.     Mouth/Throat:     Mouth: Mucous membranes are dry.  Eyes:     Conjunctiva/sclera: Conjunctivae normal.  Cardiovascular:     Rate and Rhythm: Normal rate and regular rhythm.     Heart sounds: No murmur heard. Pulmonary:     Effort: Pulmonary effort is normal. No respiratory distress.     Breath sounds: Normal breath sounds.  Abdominal:     Palpations: Abdomen is soft.     Tenderness: There is no abdominal tenderness.  Musculoskeletal:        General: No swelling.     Cervical back: Neck supple.  Skin:    General: Skin is warm and dry.     Capillary Refill: Capillary refill takes more than 3 seconds.  Neurological:     Mental Status: She is alert and oriented to person, place, and time.  Psychiatric:        Mood and Affect: Mood normal.     ____________________________________________   LABS (all labs ordered are listed, but only abnormal results are displayed)  Labs Reviewed  BASIC METABOLIC PANEL - Abnormal; Notable for the following components:      Result Value   Glucose, Bld 118 (*)    Creatinine, Ser 1.20 (*)    GFR, Estimated 44 (*)    All other components within normal limits  CBC - Abnormal; Notable for the following components:   Platelets 143 (*)    All other components within normal limits  HEPATIC FUNCTION PANEL - Abnormal; Notable for the following components:   Total Protein 6.1 (*)    All other components within normal limits  TROPONIN I (HIGH SENSITIVITY) - Abnormal; Notable for the following components:    Troponin I (High Sensitivity) 22 (*)    All other components within normal limits  TROPONIN I (HIGH SENSITIVITY) - Abnormal; Notable for the following components:   Troponin I (High Sensitivity) 21 (*)    All other components within normal limits  PROTIME-INR  MAGNESIUM   ____________________________________________  EKG  ECG remarkable for sinus rhythm with a first-degree AV block with a PR interval of 224 and otherwise unremarkable intervals, left bundle branch block and nonspecific ST changes in anterior inversions in inferior and lateral leads as well as T wave changes in anterior leads.  These changes appear fairly similar prep slightly more pronounced than ECG obtained on 12/8. ____________________________________________  RADIOLOGY  ED MD interpretation: Chest x-ray shows no focal consolidation, effusion, edema, pneumothorax or other clear acute process.  There is some scarring versus atelectasis at the bases.  Official radiology report(s): DG Chest 2 View  Result Date: 09/17/2021 CLINICAL DATA:  Chest pain EXAM: CHEST - 2 VIEW COMPARISON:  June 2022 FINDINGS: The heart size and mediastinal contours are within normal limits. Shallow inspiration with very low lung volumes. No pleural effusion or pneumothorax. Bibasilar atelectasis/scarring. Chronic interstitial changes. No acute osseous abnormality. IMPRESSION: Low lung volumes with bibasilar atelectasis/scarring. Electronically Signed   By: Macy Mis M.D.   On: 09/17/2021 14:30    ____________________________________________  PROCEDURES  Procedure(s) performed (including Critical Care):  .1-3 Lead EKG Interpretation Performed by: Lucrezia Starch, MD Authorized by: Lucrezia Starch, MD     Interpretation: non-specific     ECG rate assessment: normal     Rhythm: sinus rhythm     Conduction: abnormal     Abnormal conduction: 1st degree AV block     ____________________________________________   INITIAL  IMPRESSION / ASSESSMENT AND PLAN / ED COURSE      Patient presents with above-stated history and exam for assessment of ongoing nausea vomiting diarrhea with patient states she feels her diarrhea is getting worse and more frequent episodes.  She states that perhaps 7-10 last night and she has been feeling weaker and weaker.  Does not think she has developed any fevers or associated pain and has not syncopized in the hospital.  She has been taking her vancomycin.  She also stopped her potassium after being told it was high 2 days ago.  Today she is most worried about ongoing nausea electrolyte and kidney function problems.  On arrival today she is afebrile hemodynamically stable.  She is mildly dehydrated with dry mucous membranes and prolonged capillary refill but otherwise her abdomen is very soft and nontender throughout.   ECG remarkable for sinus rhythm with a first-degree AV block with a PR interval of 224 and otherwise unremarkable intervals, left bundle branch block and nonspecific ST changes in anterior inversions in inferior and lateral leads as well as T wave changes in anterior leads.  These changes appear fairly similar prep slightly more pronounced than ECG obtained on 12/8.  Given stable troponins at 22 and 21 compared to 36 and 36 7 days ago I have a low suspicion for occlusion MI or myocarditis at this time  Chest x-ray shows no focal consolidation, effusion, edema, pneumothorax or other clear acute process.  There is some scarring versus atelectasis at the bases.  CBC shows no leukocytosis or acute anemia.  BMP shows normal electrolytes and kidney function with a creatinine of 1.2 compared to high of 2.56 days ago.  Otherwise no significant derangements.  INR is unremarkable.  Hepatic function panel without evidence of hepatitis or cholestasis.  Magnesium is within normal months  Overall I suspect presentation is most consistent with ongoing C. difficile symptoms.  Given patient  reports worsening increase frequency of diarrhea and she is complete a 7-day course of vancomycin we will switch her to Floxin mycin to see if this helps.  At this time I do not believe she is septic and given soft abdomen without fever or leukocytosis a very low suspicion for toxic megacolon, perforation or other me life-threatening intra-abdominal process.  Given she has now tolerate p.o. on my reassessment after some Zofran I think she is stable for discharge with outpatient follow-up.  Will write for Rx for Zofran approximation.  Recommended close outpatient follow-up to have her labs specifically kidney function and potassium rechecked in 2 to 3 days.  Patient and daughter at bedside are amenable to this.  Discharged stable condition.  Strict return precautions advised and discussed.      ____________________________________________   FINAL CLINICAL IMPRESSION(S) / ED DIAGNOSES  Final diagnoses:  C. difficile colitis    Medications  fidaxomicin (DIFICID) tablet 200 mg (has no administration in time range)  ondansetron (ZOFRAN-ODT) disintegrating tablet 4 mg (4 mg Oral Given 09/17/21 1610)  lactated ringers bolus 500 mL (500 mLs Intravenous New Bag/Given 09/17/21 1607)     ED  Discharge Orders          Ordered    ondansetron (ZOFRAN) 4 MG tablet  Every 8 hours PRN        09/17/21 1628    fidaxomicin (DIFICID) 200 MG TABS tablet  2 times daily        09/17/21 1628             Note:  This document was prepared using Dragon voice recognition software and may include unintentional dictation errors.    Lucrezia Starch, MD 09/17/21 [phone]

## 2021-09-17 NOTE — Discharge Instructions (Signed)
Please work with your PCP cardiologist or gastroenterologist to have your kidney function electrolytes rechecked in the next 2 or 3 days.  Please return if you experience any new pains or fevers or any worsening of symptoms especially if you are unable to tolerate fluids by mouth.

## 2021-09-17 NOTE — Telephone Encounter (Signed)
Left voicemail message to call back for review of concerns.

## 2021-09-17 NOTE — Telephone Encounter (Signed)
Patient daughter calling States since med change patient has been getting sick - vomiting and diarrhea  Would like to know if she can take diarrhea medication and if she should still come in for labs this afternoon - she may not feel well enough Please call to discuss

## 2021-09-18 ENCOUNTER — Telehealth: Payer: Self-pay | Admitting: Physician Assistant

## 2021-09-18 ENCOUNTER — Other Ambulatory Visit (HOSPITAL_COMMUNITY): Payer: Self-pay

## 2021-09-18 ENCOUNTER — Other Ambulatory Visit: Payer: Self-pay

## 2021-09-18 ENCOUNTER — Telehealth (HOSPITAL_COMMUNITY): Payer: Self-pay | Admitting: Pharmacy Technician

## 2021-09-18 NOTE — Telephone Encounter (Signed)
Please call to discuss medications. Patient states she was sent home from the hospital and is not sure what medications she should be taking

## 2021-09-18 NOTE — Telephone Encounter (Addendum)
Patient Advocate Encounter  Received Secure chat from Pharmacist to test claim Dificid 20mg  # 20 bid.   Per Test Claim: filled yesterday at another pharmacy.  Called [facility] in [place]. They do not have it in stock. Pt's copay is 9.85. They put the order on hold in case we could fill it at another pharmacy. Notified Pharmacist of my findings.  [facility] outpatient pharmacy has in stock. [place] handled from there to send them new orders and notify family they could get it there.

## 2021-09-18 NOTE — Telephone Encounter (Signed)
Patient called in to review her medications. She was not clear on what she should be taking. She was reviewing her discharge instructions from previous hospital stay and yesterday. Reviewed her medication list and the following needed clarification given that there are some multiple orders found.    From what I can tell she should not be taking the amlodipine and it is not clear on the hydrochlorothiazide and isosorbide mononitrate.   Will clarify with provider on these medications and let her know that I would call her back if not today then on Monday. She verbalized understanding to take her other medications and was appreciative for the time in reviewing. No further questions or concerns at this time.

## 2021-09-18 NOTE — Telephone Encounter (Signed)
As discussed with patient and her daughter during her office visit, medication reconciliation was difficult at her visit due to the discharge summary not being completed.  This was further hampered by the patient being told to hold medications, though these remained on her active medication list.     From a cardiac perspective, patient should be taking carvedilol 12.5 mg twice daily and Entresto 49/51 mg twice daily.  She can remain on torsemide 20 mg daily as needed as long as she is having no further diarrhea.  If she continues to lose volume through diarrhea, she should not take a diuretic.  She should also continue to take aspirin 81 mg, clopidogrel 75 mg, atorvastatin 40 mg daily.   Until she is seen in follow-up, she should remain off amlodipine, hydrochlorothiazide, Isordil, Imdur, and KCl.  We can consider resumption of these medications in a stepwise fashion when she is seen in follow-up based on symptoms and blood pressure.  Please remove these medications from her active medication list, as she is NOT taking them.   Again, overall medication reconciliation has been significantly hampered in this patient due to the discharge summary not being complete at time of hospital follow-up, medications remaining active on the patient's medication list, despite them being held during admission and at time of discharge.

## 2021-09-18 NOTE — Telephone Encounter (Signed)
Spoke with patient and daughter via speaker phone. We reviewed all medications that she is currently taking. All medications that she should NOT be taking were removed from her list. Daughter and patient went through pill bottles while doing this review. Advised I would send her a updated medication list via mail. Daughter wrote down all information we reviewed and both verbalized understanding of medications. Both had no further questions and verbalized they were now clear on what she should be taking. They had no further concerns at this time.

## 2021-09-20 DIAGNOSIS — Z87898 Personal history of other specified conditions: Secondary | ICD-10-CM

## 2021-09-20 DIAGNOSIS — R55 Syncope and collapse: Secondary | ICD-10-CM

## 2021-09-21 ENCOUNTER — Other Ambulatory Visit (INDEPENDENT_AMBULATORY_CARE_PROVIDER_SITE_OTHER): Payer: Medicare Other

## 2021-09-21 ENCOUNTER — Other Ambulatory Visit: Payer: Self-pay

## 2021-09-21 DIAGNOSIS — G459 Transient cerebral ischemic attack, unspecified: Secondary | ICD-10-CM | POA: Diagnosis not present

## 2021-09-21 DIAGNOSIS — Z87898 Personal history of other specified conditions: Secondary | ICD-10-CM | POA: Diagnosis not present

## 2021-09-21 DIAGNOSIS — R55 Syncope and collapse: Secondary | ICD-10-CM | POA: Diagnosis not present

## 2021-09-21 DIAGNOSIS — I5043 Acute on chronic combined systolic (congestive) and diastolic (congestive) heart failure: Secondary | ICD-10-CM | POA: Diagnosis not present

## 2021-09-22 LAB — BASIC METABOLIC PANEL
BUN/Creatinine Ratio: 10 — ABNORMAL LOW (ref 12–28)
BUN: 20 mg/dL (ref 8–27)
CO2: 26 mmol/L (ref 20–29)
Calcium: 9 mg/dL (ref 8.7–10.3)
Chloride: 103 mmol/L (ref 96–106)
Creatinine, Ser: 1.96 mg/dL — ABNORMAL HIGH (ref 0.57–1.00)
Glucose: 112 mg/dL — ABNORMAL HIGH (ref 70–99)
Potassium: 4.5 mmol/L (ref 3.5–5.2)
Sodium: 140 mmol/L (ref 134–144)
eGFR: 24 mL/min/{1.73_m2} — ABNORMAL LOW (ref >59)

## 2021-09-23 ENCOUNTER — Telehealth: Payer: Self-pay

## 2021-09-23 NOTE — Telephone Encounter (Signed)
Attempted to reach out to pt via phone regarding recent labs results  Unable to make contact LMTCB

## 2021-09-26 ENCOUNTER — Other Ambulatory Visit: Payer: Self-pay | Admitting: Family Medicine

## 2021-09-26 NOTE — Telephone Encounter (Signed)
Original prescription for amitriptyline #90 3 RF on 06/17/21 and was sent to [facility]. Called [facility] to check on med and RF status.  Called pt to verify which pharmacy to have refills Was advised by pt that she wants the amitriptyline filled at [facility].   Requested Prescriptions  Pending Prescriptions Disp Refills   amitriptyline (ELAVIL) 10 MG tablet [Pharmacy Med Name: Amitriptyline HCl 10 MG Oral Tablet] 90 tablet 0    Sig: TAKE 1 TABLET BY MOUTH AT BEDTIME     Psychiatry:  Antidepressants - Heterocyclics (TCAs) Failed - 09/26/2021  2:15 PM      Failed - Completed PHQ-2 or PHQ-9 in the last 360 days      Passed - Valid encounter within last 6 months    Recent Outpatient Visits           2 months ago Diarrhea, unspecified type   [facility] Jerrol Banana., MD   3 months ago Acute on chronic combined systolic and diastolic CHF (congestive heart failure) [facility])   [facility] Jerrol Banana., MD   5 months ago Chronic combined systolic and diastolic CHF (congestive heart failure) [facility])   [facility] Jerrol Banana., MD   1 year ago Benign essential HTN   [facility] Jerrol Banana., MD   1 year ago Benign essential HTN   [facility] Jerrol Banana., MD       Future Appointments             In 3 days Dunn, Areta Haber, PA-C [facility] [place]   In 1 month Dunn, Areta Haber, PA-C [facility]

## 2021-09-29 ENCOUNTER — Other Ambulatory Visit: Payer: Self-pay | Admitting: Family Medicine

## 2021-09-29 ENCOUNTER — Ambulatory Visit (INDEPENDENT_AMBULATORY_CARE_PROVIDER_SITE_OTHER): Payer: Medicare Other | Admitting: Physician Assistant

## 2021-09-29 ENCOUNTER — Encounter: Payer: Self-pay | Admitting: Physician Assistant

## 2021-09-29 ENCOUNTER — Other Ambulatory Visit: Payer: Self-pay

## 2021-09-29 VITALS — BP 168/84 | HR 87 | Ht 62.0 in | Wt 165.0 lb

## 2021-09-29 DIAGNOSIS — I10 Essential (primary) hypertension: Secondary | ICD-10-CM

## 2021-09-29 DIAGNOSIS — Z87898 Personal history of other specified conditions: Secondary | ICD-10-CM | POA: Diagnosis not present

## 2021-09-29 DIAGNOSIS — I6523 Occlusion and stenosis of bilateral carotid arteries: Secondary | ICD-10-CM

## 2021-09-29 DIAGNOSIS — I2510 Atherosclerotic heart disease of native coronary artery without angina pectoris: Secondary | ICD-10-CM | POA: Diagnosis not present

## 2021-09-29 DIAGNOSIS — R55 Syncope and collapse: Secondary | ICD-10-CM

## 2021-09-29 DIAGNOSIS — I255 Ischemic cardiomyopathy: Secondary | ICD-10-CM

## 2021-09-29 DIAGNOSIS — R0989 Other specified symptoms and signs involving the circulatory and respiratory systems: Secondary | ICD-10-CM

## 2021-09-29 DIAGNOSIS — N182 Chronic kidney disease, stage 2 (mild): Secondary | ICD-10-CM

## 2021-09-29 DIAGNOSIS — N179 Acute kidney failure, unspecified: Secondary | ICD-10-CM | POA: Diagnosis not present

## 2021-09-29 DIAGNOSIS — I248 Other forms of acute ischemic heart disease: Secondary | ICD-10-CM

## 2021-09-29 DIAGNOSIS — E785 Hyperlipidemia, unspecified: Secondary | ICD-10-CM | POA: Diagnosis not present

## 2021-09-29 DIAGNOSIS — I5042 Chronic combined systolic (congestive) and diastolic (congestive) heart failure: Secondary | ICD-10-CM | POA: Diagnosis not present

## 2021-09-29 MED ORDER — CARVEDILOL 25 MG PO TABS: 25.0000 mg | ORAL_TABLET | Freq: Two times a day (BID) | ORAL | 3 refills | Status: DC

## 2021-09-29 NOTE — Telephone Encounter (Signed)
Copied from [place] [id number]. Topic: Quick Communication - Rx Refill/Question >> Sep 29, 2021 12:11 PM Lenon Curt, Everette A wrote: Medication: atorvastatin (LIPITOR) 40 MG tablet [[id number]]   allopurinol (ZYLOPRIM) 300 MG tablet [[id number]]   Has the patient contacted their pharmacy? Yes.  The patient's pharmacy has made contact on their behalf  (Agent: If no, request that the patient contact the pharmacy for the refill. If patient does not wish to contact the pharmacy document the reason why and proceed with request.) (Agent: If yes, when and what did the pharmacy advise?)  Preferred Pharmacy (with phone number or street name): [facility] [place] [place] - [address]  Phone:  [phone] Fax:  [phone]  Has the patient been seen for an appointment in the last year OR does the patient have an upcoming appointment? Yes.    Agent: Please be advised that RX refills may take up to 3 business days. We ask that you follow-up with your pharmacy.

## 2021-09-29 NOTE — Progress Notes (Signed)
Cardiology Office Note    Date:  09/29/2021   ID:  Stephanie Charles, DOB January 03, 1930, MRN [id number]  PCP:  Jerrol Banana., MD  Cardiologist:  Ida Rogue, MD  Electrophysiologist:  None   Chief Complaint: Follow-up  History of Present Illness:   Stephanie Charles is a 87 y.o. female with history of CAD with prior NSTEMI in 2016 as detailed below with NSTEMI in 06/2019 status post PCI/DES to the LAD, HFrEF secondary to ICM with subsequent recovery in LV systolic function to low normal by echo in 03/2021, hypertensive heart disease with labile BP, syncope, TIA/posterior CVA, LBBB, COVID in 03/2021 requiring hospital admission, mild bilateral carotid artery disease, hypothyroidism, diverticulitis, C. difficile, CKD stage II, anemia, gout, anxiety, depression, and GERD who presents for follow-up of AKI.     She was previously followed by Dr. Ubaldo Glassing, though transitioned her care to [facility] during her 06/2019 admission.  Prior Holter from 2017 showed NSR with occasional PVCs and PACs with NSVT/WCT without sustained arrhythmias. Stress testing in 12/2016 was negative for significant ischemia with normal LVSF. Echo in 12/2016 showed normal LVSF with an EF of 55% and mild MR/TR.    She was admitted to [facility] in 06/2019 with chest pain and palpitations in the setting of poorly controlled BP with systolic in the 170Y mmHg. Echo showed an EF of 35-40%, diffuse HK, normal RVSF and cavity size, moderately dilated left atrium. During the study, frequent PVCs were noted. High-sensitivity troponin peaked at 296. She underwent R/LHC that showed mid LAD 95% stenosis s/p successful PCI/DES, D2 30% stenosis. RHC showed mean RA pressure of 8, RV pressure 32/7 with a mean of 9, PA pressure 31/16 with a mean of 21, PCWP 10, CO/CI 3.57/2.02.   Echo in 09/2019 demonstrated an EF of 40 to 45%, moderate LVH, abnormal septal wall motion consistent with known LBBB, grade 1 diastolic dysfunction, global hypokinesis,  moderately reduced RV systolic function with normal ventricular cavity size and mildly increased wall thickness, moderately dilated left atrium, trivial mitral regurgitation, mild pulmonic regurgitation, and an estimated right atrial pressure of 8 mmHg.  Carotid artery ultrasound from 07/2020 showed 1 to 39% bilateral ICA stenosis.   She was admitted to the hospital in 03/2021 twice, initially from 6/3 through 6/7 with COVID-pneumonia and possible acute on chronic combined systolic and diastolic CHF in the context of accelerated hypertension.  BNP elevated at 960.  Echo demonstrated an improved LV systolic function with an EF of 50 to 55%, moderate LVH, normal RV systolic function and ventricular cavity size, moderately dilated left atrium, and mild aortic valve sclerosis without evidence of stenosis.  Following her discharge on 6/7, she was readmitted on 6/8 through 6/10 with nausea, vomiting, and diarrhea with associated syncope while sitting on the toilet.  Note indicates that she was bradycardic into the 40s bpm and hypotensive in the ER.  She was given 1 dose of atropine.  She had AKI and hypotension which was felt to be due to volume depletion.  She was treated with IV fluids, antiemetics, and steroids.  Allergy was not consulted during these admissions.   She was most recently admitted to [facility] from 12/8 through 12/10 with an episode of near syncope.  She had been under increased stress as she had recently traveled to [place] to visit with one of her daughters who is transitioning to hospice in the setting of glioblastoma.  She reported multiple episodes of diarrhea.  She was hypokalemic  with a potassium of 3.3 trending further to 2.5.  She had acute on CKD with a BUN of 60 and serum creatinine 2.69 (baseline serum creatinine 1.4-1.6), PCR positive for C. difficile, high-sensitivity troponin minimally elevated at 36x2.  CT head showed no acute intracranial pathology.  Lower extremity ultrasound was negative  for DVT bilaterally.  Her admission was notable for an episode of bradycardia following a BM.  Presyncope was felt to be orthostatic in etiology in the setting of volume loss with diarrhea associated with C. difficile.  In the context of hypotension secondary to volume loss, some of her medications were held at discharge.  She was seen in hospital follow-up on 12/13 and doing well from a cardiac perspective without any further near syncopal or syncopal episodes.  She did continue to have diarrhea.  She was taking vancomycin.  To further evaluate her syncopal/near syncopal episodes, it was recommended she undergo Zio patch monitoring.  She was resumed on torsemide 20 mg daily as needed and continued on carvedilol 12.5 mg twice daily along with Entresto 49/51 mg twice daily.  Since she was last seen in our office, she was evaluated in the ED on 09/17/2021 with ongoing and progressively worsening diarrhea felt to be related to ongoing C. difficile infection.  Labs at that time showed a high-sensitivity troponin of 22 with a delta of 21, potassium 4.3, BUN 19, serum creatinine 1.2.  She was transitioned from vancomycin to fosfomycin by the ED.  She comes in doing very well from a cardiac perspective.  No chest pain, dyspnea, palpitations, dizziness, recent COVID, or syncope.  She notes her diarrhea has improved, particularly over the past 3 days with no further watery stools.  She has been increasing her water intake.  She has also noted an increase in her blood pressure over the past 3 days with home BP readings in the 295J systolic.  She has been adherent to her medications and has only needed 1 torsemide since she was last seen due to lower extremity swelling and weight gain, this was yesterday.  Her weight this morning is back to her baseline with a range of 159 to 161 pounds by her home scale.  She continues to wear her Zio patch.   Labs independently reviewed: 09/2021 - magnesium 1.8, potassium 4.5, BUN 20,  serum creatinine 1.96, albumin 3.9, AST/ALT normal, Hgb 12.5, PLT 143 03/2021 - TSH 0.052, free T4 1.25, TC 118, TG 83, HDL 33, LDL 68, A1c 5.6  Past Medical History:  Diagnosis Date   Anemia    Diverticulitis    GERD (gastroesophageal reflux disease)    Hyperlipidemia    Hypertension    Hypothyroidism    Myocardial infarction ([place]) 08/2017   TIA (transient ischemic attack)    1 approx 2015, 1 approx 2018   Vertigo    last episode several months ago   [name]     Past Surgical History:  Procedure Laterality Date   COLONOSCOPY WITH PROPOFOL N/A 06/22/2018   Procedure: COLONOSCOPY WITH PROPOFOL;  Surgeon: Jonathon Bellows, MD;  Location: [facility] ENDOSCOPY;  Service: Endoscopy;  Laterality: N/A;   CORONARY STENT INTERVENTION N/A 06/07/2019   Procedure: CORONARY STENT INTERVENTION;  Surgeon: [name], MD;  Location: [place] CV LAB;  Service: Cardiovascular;  Laterality: N/A;   ESOPHAGOGASTRODUODENOSCOPY (EGD) WITH PROPOFOL N/A 06/22/2018   Procedure: ESOPHAGOGASTRODUODENOSCOPY (EGD) WITH PROPOFOL;  Surgeon: Jonathon Bellows, MD;  Location: [facility] ENDOSCOPY;  Service: Endoscopy;  Laterality:  N/A;   KNEE ARTHROSCOPY Right    REPLACEMENT TOTAL KNEE BILATERAL     RIGHT/LEFT HEART CATH AND CORONARY ANGIOGRAPHY N/A 06/07/2019   Procedure: RIGHT/LEFT HEART CATH AND CORONARY ANGIOGRAPHY;  Surgeon: [name], MD;  Location: [facility] CV LAB;  Service: Cardiovascular;  Laterality: N/A;   SHOULDER SURGERY Left     Current Medications: Current Meds  Medication Sig   albuterol (PROVENTIL) (2.5 MG/3ML) 0.083% nebulizer solution USE 1 VIAL IN NEBULIZER EVERY 4 HOURS AS NEEDED   allopurinol (ZYLOPRIM) 300 MG tablet NEW PRESCRIPTION REQUEST: TAKE ONE TABLET BY MOUTH EVERY DAY   amitriptyline (ELAVIL) 10 MG tablet TAKE 1 TABLET BY MOUTH AT BEDTIME   ASPIRIN LOW DOSE 81 MG EC tablet NEW PRESCRIPTION REQUEST: TAKE ONE TABLET BY MOUTH EVERY DAY   atorvastatin (LIPITOR) 40  MG tablet NEW PRESCRIPTION REQUEST: TAKE ONE TABLET BY MOUTH EVERY DAY   clopidogrel (PLAVIX) 75 MG tablet NEW PRESCRIPTION REQUEST: TAKE ONE TABLET BY MOUTH EVERY DAY   Ferrous Sulfate 27 MG TABS Take 27 mg by mouth at bedtime.   hydrocortisone 2.5 % cream Apply 1 application topically 2 (two) times daily as needed.   levothyroxine (SYNTHROID) 137 MCG tablet NEW PRESCRIPTION REQUEST: TAKE ONE TABLET BY MOUTH EVERY DAY IN THE MORNING   Melatonin 10 MG TABS Take 20 mg by mouth at bedtime.   mirtazapine (REMERON) 30 MG tablet NEW PRESCRIPTION REQUEST: TAKE ONE TABLET BY MOUTH AT BEDTIME   Multiple Vitamin (MULTIVITAMIN) capsule Take 1 capsule by mouth every evening.   pantoprazole (PROTONIX) 40 MG tablet NEW PRESCRIPTION REQUEST: TAKE ONE TABLET BY MOUTH EVERY DAY   sacubitril-valsartan (ENTRESTO) 49-51 MG Take 1 tablet by mouth 2 (two) times daily.   sertraline (ZOLOFT) 25 MG tablet NEW PRESCRIPTION REQUEST: TAKE ONE TABLET BY MOUTH EVERY DAY   [DISCONTINUED] carvedilol (COREG) 12.5 MG tablet Take 1 tablet (12.5 mg total) by mouth 2 (two) times daily.    Allergies:   Latex, Levofloxacin, Lisinopril, Povidone-iodine, Simvastatin, and Codeine   Social History   Socioeconomic History   Marital status: Divorced    Spouse name: na   Number of children: 4   Years of education: 14   Highest education level: Associate degree: occupational, Hotel manager, or vocational program  Occupational History   Occupation: retired  Tobacco Use   Smoking status: Never   Smokeless tobacco: Never  Scientific laboratory technician Use: Never used  Substance and Sexual Activity   Alcohol use: No    Alcohol/week: 0.0 standard drinks   Drug use: No   Sexual activity: Never  Other Topics Concern   Not on file  Social History Narrative   Not on file   Social Determinants of Health   Financial Resource Strain: Not on file  Food Insecurity: Not on file  Transportation Needs: Not on file  Physical Activity: Not on file   Stress: Not on file  Social Connections: Not on file     Family History:  The patient's family history includes Alzheimer's disease in her brother and brother; Breast cancer in her sister; Cancer in her brother; Clotting disorder in her daughter; Fibromyalgia in her daughter; Heart attack in her brother; Heart disease in her brother, brother, father, and mother; Hypertension in her mother; Mental illness in her brother; Stroke in her brother and mother.  ROS:   Review of Systems  Constitutional:  Positive for malaise/fatigue. Negative for chills, diaphoresis, fever and weight loss.  Improving fatigue  HENT:  Negative for congestion.   Eyes:  Negative for discharge and redness.  Respiratory:  Negative for cough, sputum production, shortness of breath and wheezing.   Cardiovascular:  Negative for chest pain, palpitations, orthopnea, claudication, leg swelling and PND.  Gastrointestinal:  Positive for diarrhea. Negative for abdominal pain, blood in stool, constipation, heartburn, melena, nausea and vomiting.       Improving diarrhea  Musculoskeletal:  Negative for falls and myalgias.  Skin:  Negative for rash.  Neurological:  Negative for dizziness, tingling, tremors, sensory change, speech change, focal weakness, loss of consciousness and weakness.  Endo/Heme/Allergies:  Does not bruise/bleed easily.  Psychiatric/Behavioral:  Negative for substance abuse. The patient is not nervous/anxious.   All other systems reviewed and are negative.   EKGs/Labs/Other Studies Reviewed:    Studies reviewed were summarized above. The additional studies were reviewed today:  2D echo 03/07/2021: 1. Left ventricular ejection fraction, by estimation, is 50 to 55%. The  left ventricle has low normal function. Left ventricular endocardial  border not optimally defined to evaluate regional wall motion. There is  moderate left ventricular hypertrophy.  Indeterminate diastolic filling due to E-A fusion.    2. Right ventricular systolic function is normal. The right ventricular  size is normal. Tricuspid regurgitation signal is inadequate for assessing  PA pressure.   3. Left atrial size was moderately dilated.   4. The mitral valve is abnormal. No evidence of mitral valve  regurgitation. No evidence of mitral stenosis.   5. The aortic valve has an indeterminant number of cusps. There is mild  thickening of the aortic valve. Aortic valve regurgitation is not  visualized. Mild aortic valve sclerosis is present, with no evidence of  aortic valve stenosis. __________   Carotid artery ultrasound 07/2020: Summary:  Right Carotid: Velocities in the right ICA are consistent with a 1-39%  stenosis.  Non-hemodynamically significant plaque <50% noted in the  CCA.  The ECA appears >50% stenosed.   Left Carotid: Velocities in the left ICA are consistent with a 1-39%  stenosis.  Non-hemodynamically significant plaque <50% noted in the  CCA.  The ECA appears <50% stenosed.   Vertebrals:  Bilateral vertebral arteries demonstrate antegrade flow.  Subclavians: Left subclavian artery was stenotic. Normal flow hemodynamics  were seen in the right subclavian artery.  __________   2D echo 09/2019: 1. Left ventricular ejection fraction, by visual estimation, is 40 to  45%. The left ventricle has moderately decreased function. There is  moderately increased left ventricular hypertrophy.   2. Abnormal septal motion consistent with left bundle branch block.   3. Left ventricular diastolic parameters are consistent with Grade I  diastolic dysfunction (impaired relaxation).   4. The left ventricle demonstrates global hypokinesis.   5. Global right ventricle has moderately reduced systolic function.The  right ventricular size is normal. Mildly increased right ventricular wall  thickness.   6. Left atrial size was moderately dilated.   7. Right atrial size was not well visualized.   8. Presence of  pericardial fat pad.   9. The mitral valve was not well visualized. Trivial mitral valve  regurgitation. No evidence of mitral stenosis.  10. The tricuspid valve is not well visualized. Tricuspid valve  regurgitation is not demonstrated.  11. The aortic valve is tricuspid. Aortic valve regurgitation is not  visualized. No evidence of aortic valve sclerosis or stenosis.  12. Pulmonic regurgitation is mild.  13. The pulmonic valve was not well visualized. Pulmonic valve  regurgitation is mild.  14. TR signal is inadequate for assessing pulmonary artery systolic  pressure.  15. The inferior vena cava is dilated in size with >50% respiratory  variability, suggesting right atrial pressure of 8 mmHg.  16. The interatrial septum was not well visualized. __________   [facility] with PCI in 06/07/2019: Mid LAD lesion is 95% stenosed. Post intervention, there is a 0% residual stenosis. A drug-eluting stent was successfully placed using a STENT RESOLUTE ONYX 2.5X22. 2nd Diag lesion is 30% stenosed.   Successful angioplasty and drug-eluting stent placement to the mid LAD.   Recommendations: Dual antiplatelet therapy for at least 1 year. Blood pressure control and cardiac rehab.  Continue treatment of risk factors.       Coronary angiography:  Coronary dominance: Right  Left mainstem:  Large vessel that bifurcates into the LAD and left circumflex, no significant disease noted  Left anterior descending (LAD):   Large vessel that extends to the apical region, diagonal branch 2 of moderate size, proximal to mid LAD prior to large diagonal with 90% stenosis, hazy, heavily calcified.  Left circumflex (LCx):  Large vessel with OM branch 2, no significant disease noted, mild to moderate proximal to mid disease estimated 40%  Right coronary artery (RCA):  Right dominant vessel with PL and PDA, no significant disease noted, very mild proximal disease estimated 30%  Left ventriculography: Aortic valve  crossed for pressures, no significant there is no significant mitral regurgitation , no significant aortic valve stenosis Frequent ectopy noted  RA pressures mean of 8 RV pressure 32/7 mean 9 PA pressure 31/16 mean 21 Wedge pressure 10 Cardiac output 3.57 Cardiac index 2.02  Final Conclusions:   Culprit vessel for presentation is the proximal to mid LAD estimated at 90% Case discussed with Dr. Fletcher Anon, he will attempt PCI   Recommendations:  pci as above Cardiomyopathy meds, will make changes __________   2D echo 06/2019: 1. The left ventricle has moderately reduced systolic function, with an  ejection fraction of 35-40%. The cavity size was normal. There is  moderately increased left ventricular wall thickness. Left ventricular  diastolic Doppler parameters are  indeterminate. Left ventricular diffuse hypokinesis.   2. The right ventricle has normal systolic function. The cavity was  normal. There is no increase in right ventricular wall thickness.Unable to  estimate RVSP   3. Frequent PVCs.   4. Left atrial size was moderately dilated.    EKG:  EKG is not ordered today.    Recent Labs: 03/06/2021: B Natriuretic Peptide 960.1 03/13/2021: TSH 0.052 09/17/2021: ALT 17; Hemoglobin 12.5; Magnesium 1.8; Platelets 143 09/21/2021: BUN 20; Creatinine, Ser 1.96; Potassium 4.5; Sodium 140  Recent Lipid Panel    Component Value Date/Time   CHOL 118 03/07/2021 0514   CHOL 115 03/13/2020 1440   CHOL 120 10/19/2011 0323   TRIG 83 03/07/2021 0514   TRIG 152 10/19/2011 0323   HDL 33 (L) 03/07/2021 0514   HDL 29 (L) 03/13/2020 1440   HDL 15 (L) 10/19/2011 0323   CHOLHDL 3.6 03/07/2021 0514   VLDL 17 03/07/2021 0514   VLDL 30 10/19/2011 0323   LDLCALC 68 03/07/2021 0514   LDLCALC 53 03/13/2020 1440   LDLCALC 75 10/19/2011 0323    PHYSICAL EXAM:    VS:  BP (!) 168/84 (BP Location: Left Arm, Patient Position: Sitting, Cuff Size: Large)    Pulse 87    Ht 5\' 2"  (1.575 m)    Wt 165  lb (74.8 kg)  SpO2 97%    BMI 30.18 kg/m   BMI: Body mass index is 30.18 kg/m.  Physical Exam Vitals reviewed.  Constitutional:      Appearance: She is well-developed.  HENT:     Head: Normocephalic and atraumatic.  Eyes:     General:        Right eye: No discharge.        Left eye: No discharge.  Neck:     Vascular: No JVD.  Cardiovascular:     Rate and Rhythm: Normal rate and regular rhythm.     Pulses:          Carotid pulses are  on the right side with bruit.      Posterior tibial pulses are 2+ on the right side and 2+ on the left side.     Heart sounds: Normal heart sounds, S1 normal and S2 normal. Heart sounds not distant. No midsystolic click and no opening snap. No murmur heard.   No friction rub.  Pulmonary:     Effort: Pulmonary effort is normal. No respiratory distress.     Breath sounds: Normal breath sounds. No decreased breath sounds, wheezing or rales.  Chest:     Chest wall: No tenderness.  Abdominal:     General: There is no distension.     Palpations: Abdomen is soft.     Tenderness: There is no abdominal tenderness.  Musculoskeletal:     Cervical back: Normal range of motion.     Right lower leg: No edema.     Left lower leg: No edema.  Skin:    General: Skin is warm and dry.     Nails: There is no clubbing.  Neurological:     Mental Status: She is alert and oriented to person, place, and time.  Psychiatric:        Speech: Speech normal.        Behavior: Behavior normal.        Thought Content: Thought content normal.        Judgment: Judgment normal.    Wt Readings from Last 3 Encounters:  09/29/21 165 lb (74.8 kg)  09/17/21 159 lb (72.1 kg)  09/15/21 162 lb (73.5 kg)     ASSESSMENT & PLAN:   Near syncope with history of syncope: No further episodes.  Felt to be orthostatic in etiology in the context of volume depletion secondary to C. difficile diarrhea.  Zio patch is pending.  She does have a history of left bundle branch block.  Should  she have recurrent episodes, not in the context of volume depletion, or based on 0 results, would have a low threshold to have her evaluated by EP.  CAD involving the native coronaries without angina and mildly elevated high-sensitivity troponin: No symptoms concerning for angina.  She remains on aspirin and clopidogrel along with atorvastatin and carvedilol.  Previously noted minimally elevated high-sensitivity troponin likely in the context of demand ischemia secondary to C. difficile colitis, dehydration, and AKI.  Chronic combined systolic and diastolic CHF with subsequent recovery of LV systolic function/ICM: She appears euvolemic and well compensated.  Titrate carvedilol to 25 mg twice daily with continuation of Entresto 49/51 mg twice daily.  She remains on torsemide 20 mg as needed for lower extremity swelling or weight gain greater than 3 pounds overnight.  I have encouraged her to minimize use of torsemide and to not use this medication should she have significant volume loss with recurrence of diarrhea.  Labile hypertension:  Blood pressure is mildly elevated in the office today and has been elevated at home over the past several days.  Titrate carvedilol to 25 mg twice daily.  Otherwise, she will continue Entresto as outlined above.  Carotid artery disease with right sided carotid artery bruit: Carotid artery ultrasound in 07/2020 demonstrated 1 to 39% bilateral ICA stenoses.  Carotid artery ultrasound is scheduled for next month.  She remains on aspirin and atorvastatin.  HLD: LDL 68.  She remains on atorvastatin.  Acute on CKD stage II: Likely in the context of volume loss associated with diarrhea.  This does not appear to be related to loop diuretic use as this has been used sparingly.  Check BMP today.  Hypokalemia/hypomagnesemia: Improved on last check.  Likely in the context of C. difficile diarrhea.  C. difficile/diarrhea: Improving.  She has completed her antibiotic therapy.   Encouraged her to follow-up with her PCP and GI.  Abnormal thyroid function: Follow-up with PCP.  This was not discussed in detail at today's visit.      Disposition: F/u with Dr. Rockey Situ or an APP in 2 months.   Medication Adjustments/Labs and Tests Ordered: Current medicines are reviewed at length with the patient today.  Concerns regarding medicines are outlined above. Medication changes, Labs and Tests ordered today are summarized above and listed in the Patient Instructions accessible in Encounters.   Signed, Christell Faith, PA-C 09/29/2021 3:31 PM     [address] [place], [place] [address] [phone]

## 2021-09-29 NOTE — Patient Instructions (Signed)
Medication Instructions:   Please INCREASE Coreg to 25 mg twice a day  *If you need a refill on your cardiac medications before your next appointment, please call your pharmacy*   Lab Work: BMP LABS WILL APPEAR ON MYCHART, ABNORMAL RESULTS WILL BE CALLED  Testing/Procedures: None  Follow-Up: At [facility], you and your health needs are our priority.  As part of our continuing mission to provide you with exceptional heart care, we have created designated Provider Care Teams.  These Care Teams include your primary Cardiologist (physician) and Advanced Practice Providers (APPs -  Physician Assistants and Nurse Practitioners) who all work together to provide you with the care you need, when you need it.   Your next appointment:   2 month(s)  The format for your next appointment:   In Person  Provider:   Christell Faith, PA-C

## 2021-09-30 LAB — BASIC METABOLIC PANEL
BUN/Creatinine Ratio: 13 (ref 12–28)
BUN: 17 mg/dL (ref 8–27)
CO2: 27 mmol/L (ref 20–29)
Calcium: 9.2 mg/dL (ref 8.7–10.3)
Chloride: 101 mmol/L (ref 96–106)
Creatinine, Ser: 1.27 mg/dL — ABNORMAL HIGH (ref 0.57–1.00)
Glucose: 112 mg/dL — ABNORMAL HIGH (ref 70–99)
Potassium: 4.5 mmol/L (ref 3.5–5.2)
Sodium: 142 mmol/L (ref 134–144)
eGFR: 40 mL/min/{1.73_m2} — ABNORMAL LOW (ref >59)

## 2021-09-30 NOTE — Telephone Encounter (Signed)
Allopurinol Rx was written 08/03/2021 #90 with 1 refill. Statin was written 08/03/2021 #90 with 3 refills.

## 2021-10-05 ENCOUNTER — Encounter (HOSPITAL_COMMUNITY): Payer: Self-pay | Admitting: Internal Medicine

## 2021-10-05 ENCOUNTER — Emergency Department (HOSPITAL_COMMUNITY): Payer: Medicare Other

## 2021-10-05 ENCOUNTER — Other Ambulatory Visit: Payer: Self-pay

## 2021-10-05 ENCOUNTER — Inpatient Hospital Stay (HOSPITAL_COMMUNITY)
Admission: EM | Admit: 2021-10-05 | Discharge: 2021-10-07 | DRG: 291 | Disposition: A | Discharge status: Home / Self Care | Payer: Medicare Other | Attending: Internal Medicine | Admitting: Internal Medicine

## 2021-10-05 DIAGNOSIS — I509 Heart failure, unspecified: Secondary | ICD-10-CM | POA: Diagnosis not present

## 2021-10-05 DIAGNOSIS — Z885 Allergy status to narcotic agent status: Secondary | ICD-10-CM | POA: Diagnosis not present

## 2021-10-05 DIAGNOSIS — I255 Ischemic cardiomyopathy: Secondary | ICD-10-CM | POA: Diagnosis not present

## 2021-10-05 DIAGNOSIS — I5023 Acute on chronic systolic (congestive) heart failure: Secondary | ICD-10-CM | POA: Diagnosis not present

## 2021-10-05 DIAGNOSIS — E785 Hyperlipidemia, unspecified: Secondary | ICD-10-CM | POA: Diagnosis not present

## 2021-10-05 DIAGNOSIS — J811 Chronic pulmonary edema: Secondary | ICD-10-CM | POA: Diagnosis not present

## 2021-10-05 DIAGNOSIS — Z7982 Long term (current) use of aspirin: Secondary | ICD-10-CM

## 2021-10-05 DIAGNOSIS — I252 Old myocardial infarction: Secondary | ICD-10-CM | POA: Diagnosis not present

## 2021-10-05 DIAGNOSIS — N1832 Chronic kidney disease, stage 3b: Secondary | ICD-10-CM | POA: Diagnosis present

## 2021-10-05 DIAGNOSIS — I2510 Atherosclerotic heart disease of native coronary artery without angina pectoris: Secondary | ICD-10-CM | POA: Diagnosis not present

## 2021-10-05 DIAGNOSIS — Z823 Family history of stroke: Secondary | ICD-10-CM

## 2021-10-05 DIAGNOSIS — I130 Hypertensive heart and chronic kidney disease with heart failure and stage 1 through stage 4 chronic kidney disease, or unspecified chronic kidney disease: Principal | ICD-10-CM | POA: Diagnosis present

## 2021-10-05 DIAGNOSIS — R001 Bradycardia, unspecified: Secondary | ICD-10-CM | POA: Diagnosis not present

## 2021-10-05 DIAGNOSIS — I10 Essential (primary) hypertension: Secondary | ICD-10-CM | POA: Diagnosis not present

## 2021-10-05 DIAGNOSIS — Z888 Allergy status to other drugs, medicaments and biological substances status: Secondary | ICD-10-CM | POA: Diagnosis not present

## 2021-10-05 DIAGNOSIS — I517 Cardiomegaly: Secondary | ICD-10-CM | POA: Diagnosis not present

## 2021-10-05 DIAGNOSIS — Z79899 Other long term (current) drug therapy: Secondary | ICD-10-CM | POA: Diagnosis not present

## 2021-10-05 DIAGNOSIS — R0602 Shortness of breath: Secondary | ICD-10-CM

## 2021-10-05 DIAGNOSIS — Z8673 Personal history of transient ischemic attack (TIA), and cerebral infarction without residual deficits: Secondary | ICD-10-CM

## 2021-10-05 DIAGNOSIS — E039 Hypothyroidism, unspecified: Secondary | ICD-10-CM | POA: Diagnosis present

## 2021-10-05 DIAGNOSIS — I248 Other forms of acute ischemic heart disease: Secondary | ICD-10-CM | POA: Diagnosis not present

## 2021-10-05 DIAGNOSIS — Z20822 Contact with and (suspected) exposure to covid-19: Secondary | ICD-10-CM | POA: Diagnosis present

## 2021-10-05 DIAGNOSIS — Z7902 Long term (current) use of antithrombotics/antiplatelets: Secondary | ICD-10-CM

## 2021-10-05 DIAGNOSIS — R6889 Other general symptoms and signs: Secondary | ICD-10-CM | POA: Diagnosis not present

## 2021-10-05 DIAGNOSIS — Z743 Need for continuous supervision: Secondary | ICD-10-CM | POA: Diagnosis not present

## 2021-10-05 DIAGNOSIS — Z881 Allergy status to other antibiotic agents status: Secondary | ICD-10-CM | POA: Diagnosis not present

## 2021-10-05 DIAGNOSIS — E876 Hypokalemia: Secondary | ICD-10-CM | POA: Diagnosis not present

## 2021-10-05 DIAGNOSIS — M7989 Other specified soft tissue disorders: Secondary | ICD-10-CM | POA: Diagnosis not present

## 2021-10-05 DIAGNOSIS — Z79890 Hormone replacement therapy (postmenopausal): Secondary | ICD-10-CM

## 2021-10-05 DIAGNOSIS — Z91040 Latex allergy status: Secondary | ICD-10-CM

## 2021-10-05 DIAGNOSIS — J9601 Acute respiratory failure with hypoxia: Secondary | ICD-10-CM

## 2021-10-05 DIAGNOSIS — Z8249 Family history of ischemic heart disease and other diseases of the circulatory system: Secondary | ICD-10-CM

## 2021-10-05 DIAGNOSIS — I5043 Acute on chronic combined systolic (congestive) and diastolic (congestive) heart failure: Secondary | ICD-10-CM | POA: Diagnosis not present

## 2021-10-05 DIAGNOSIS — Z974 Presence of external hearing-aid: Secondary | ICD-10-CM

## 2021-10-05 DIAGNOSIS — R778 Other specified abnormalities of plasma proteins: Secondary | ICD-10-CM | POA: Diagnosis not present

## 2021-10-05 DIAGNOSIS — Z96653 Presence of artificial knee joint, bilateral: Secondary | ICD-10-CM | POA: Diagnosis present

## 2021-10-05 DIAGNOSIS — J9621 Acute and chronic respiratory failure with hypoxia: Secondary | ICD-10-CM | POA: Diagnosis present

## 2021-10-05 DIAGNOSIS — J80 Acute respiratory distress syndrome: Secondary | ICD-10-CM | POA: Diagnosis not present

## 2021-10-05 DIAGNOSIS — I700 Atherosclerosis of aorta: Secondary | ICD-10-CM | POA: Diagnosis not present

## 2021-10-05 HISTORY — DX: Heart failure, unspecified: I50.9

## 2021-10-05 LAB — COMPREHENSIVE METABOLIC PANEL
ALT: 16 U/L (ref 0–44)
AST: 20 U/L (ref 15–41)
Albumin: 3.5 g/dL (ref 3.5–5.0)
Alkaline Phosphatase: 76 U/L (ref 38–126)
Anion gap: 10 (ref 5–15)
BUN: 16 mg/dL (ref 8–23)
CO2: 24 mmol/L (ref 22–32)
Calcium: 8.9 mg/dL (ref 8.9–10.3)
Chloride: 105 mmol/L (ref 98–111)
Creatinine, Ser: 1.3 mg/dL — ABNORMAL HIGH (ref 0.44–1.00)
GFR, Estimated: 39 mL/min — ABNORMAL LOW (ref >60)
Glucose, Bld: 122 mg/dL — ABNORMAL HIGH (ref 70–99)
Potassium: 4 mmol/L (ref 3.5–5.1)
Sodium: 139 mmol/L (ref 135–145)
Total Bilirubin: 0.9 mg/dL (ref 0.3–1.2)
Total Protein: 5.6 g/dL — ABNORMAL LOW (ref 6.5–8.1)

## 2021-10-05 LAB — CBC WITH DIFFERENTIAL/PLATELET
Abs Immature Granulocytes: 0.03 10*3/uL (ref 0.00–0.07)
Basophils Absolute: 0 10*3/uL (ref 0.0–0.1)
Basophils Relative: 1 %
Eosinophils Absolute: 0.2 10*3/uL (ref 0.0–0.5)
Eosinophils Relative: 2 %
HCT: 36.9 % (ref 36.0–46.0)
Hemoglobin: 12 g/dL (ref 12.0–15.0)
Immature Granulocytes: 0 %
Lymphocytes Relative: 14 %
Lymphs Abs: 1.1 10*3/uL (ref 0.7–4.0)
MCH: 31.4 pg (ref 26.0–34.0)
MCHC: 32.5 g/dL (ref 30.0–36.0)
MCV: 96.6 fL (ref 80.0–100.0)
Monocytes Absolute: 0.3 10*3/uL (ref 0.1–1.0)
Monocytes Relative: 4 %
Neutro Abs: 6.3 10*3/uL (ref 1.7–7.7)
Neutrophils Relative %: 79 %
Platelets: 122 10*3/uL — ABNORMAL LOW (ref 150–400)
RBC: 3.82 MIL/uL — ABNORMAL LOW (ref 3.87–5.11)
RDW: 14.8 % (ref 11.5–15.5)
WBC: 7.9 10*3/uL (ref 4.0–10.5)
nRBC: 0 % (ref 0.0–0.2)

## 2021-10-05 LAB — RESP PANEL BY RT-PCR (FLU A&B, COVID) ARPGX2
Influenza A by PCR: NEGATIVE
Influenza B by PCR: NEGATIVE
SARS Coronavirus 2 by RT PCR: NEGATIVE

## 2021-10-05 LAB — TROPONIN I (HIGH SENSITIVITY)
Troponin I (High Sensitivity): 26 ng/L — ABNORMAL HIGH (ref <18)
Troponin I (High Sensitivity): 31 ng/L — ABNORMAL HIGH (ref <18)

## 2021-10-05 LAB — TSH: TSH: 4.121 u[IU]/mL (ref 0.350–4.500)

## 2021-10-05 LAB — MAGNESIUM: Magnesium: 1.9 mg/dL (ref 1.7–2.4)

## 2021-10-05 LAB — BRAIN NATRIURETIC PEPTIDE: B Natriuretic Peptide: 859.6 pg/mL — ABNORMAL HIGH (ref 0.0–100.0)

## 2021-10-05 MED ORDER — PANTOPRAZOLE SODIUM 40 MG PO TBEC
40.0000 mg | DELAYED_RELEASE_TABLET | Freq: Every day | ORAL | Status: DC
  Administered 2021-10-06 – 2021-10-07 (×2): 40 mg via ORAL
  Filled 2021-10-05 (×2): qty 1

## 2021-10-05 MED ORDER — SODIUM CHLORIDE 0.9 % IV SOLN: 250.0000 mL | INTRAVENOUS | Status: DC | PRN

## 2021-10-05 MED ORDER — HYDRALAZINE HCL 25 MG PO TABS
25.0000 mg | ORAL_TABLET | Freq: Four times a day (QID) | ORAL | Status: DC | PRN
  Administered 2021-10-06: 25 mg via ORAL
  Filled 2021-10-05: qty 1

## 2021-10-05 MED ORDER — ASPIRIN EC 81 MG PO TBEC
81.0000 mg | DELAYED_RELEASE_TABLET | Freq: Every day | ORAL | Status: DC
  Administered 2021-10-06 – 2021-10-07 (×2): 81 mg via ORAL
  Filled 2021-10-05 (×2): qty 1

## 2021-10-05 MED ORDER — ISOSORBIDE DINITRATE 20 MG PO TABS
40.0000 mg | ORAL_TABLET | Freq: Two times a day (BID) | ORAL | Status: DC
  Filled 2021-10-05: qty 2

## 2021-10-05 MED ORDER — SACUBITRIL-VALSARTAN 49-51 MG PO TABS
1.0000 | ORAL_TABLET | Freq: Two times a day (BID) | ORAL | Status: DC
  Administered 2021-10-05 – 2021-10-07 (×4): 1 via ORAL
  Filled 2021-10-05 (×5): qty 1

## 2021-10-05 MED ORDER — FERROUS SULFATE 325 (65 FE) MG PO TABS
325.0000 mg | ORAL_TABLET | Freq: Every day | ORAL | Status: DC
  Administered 2021-10-05 – 2021-10-06 (×2): 325 mg via ORAL
  Filled 2021-10-05 (×2): qty 1

## 2021-10-05 MED ORDER — SODIUM CHLORIDE 0.9% FLUSH: 3.0000 mL | INTRAVENOUS | Status: DC | PRN

## 2021-10-05 MED ORDER — ENOXAPARIN SODIUM 30 MG/0.3ML IJ SOSY
30.0000 mg | PREFILLED_SYRINGE | INTRAMUSCULAR | Status: DC
  Administered 2021-10-05 – 2021-10-06 (×2): 30 mg via SUBCUTANEOUS
  Filled 2021-10-05 (×2): qty 0.3

## 2021-10-05 MED ORDER — CARVEDILOL 12.5 MG PO TABS
12.5000 mg | ORAL_TABLET | Freq: Two times a day (BID) | ORAL | Status: DC
  Administered 2021-10-05 – 2021-10-07 (×4): 12.5 mg via ORAL
  Filled 2021-10-05 (×4): qty 1

## 2021-10-05 MED ORDER — CLOPIDOGREL BISULFATE 75 MG PO TABS
75.0000 mg | ORAL_TABLET | Freq: Every day | ORAL | Status: DC
  Administered 2021-10-06 – 2021-10-07 (×2): 75 mg via ORAL
  Filled 2021-10-05 (×2): qty 1

## 2021-10-05 MED ORDER — MIRTAZAPINE 30 MG PO TABS
30.0000 mg | ORAL_TABLET | Freq: Every day | ORAL | Status: DC
  Administered 2021-10-05 – 2021-10-06 (×2): 30 mg via ORAL
  Filled 2021-10-05 (×3): qty 1

## 2021-10-05 MED ORDER — FUROSEMIDE 10 MG/ML IJ SOLN
40.0000 mg | Freq: Every day | INTRAMUSCULAR | Status: DC
Start: 2021-10-05 — End: 2021-10-05

## 2021-10-05 MED ORDER — FUROSEMIDE 10 MG/ML IJ SOLN
40.0000 mg | Freq: Once | INTRAMUSCULAR | Status: AC
  Administered 2021-10-05: 40 mg via INTRAVENOUS
  Filled 2021-10-05: qty 4

## 2021-10-05 MED ORDER — SODIUM CHLORIDE 0.9% FLUSH
3.0000 mL | Freq: Two times a day (BID) | INTRAVENOUS | Status: DC
  Administered 2021-10-05 – 2021-10-07 (×4): 3 mL via INTRAVENOUS

## 2021-10-05 MED ORDER — ACETAMINOPHEN 325 MG PO TABS: 650.0000 mg | ORAL_TABLET | ORAL | Status: DC | PRN

## 2021-10-05 MED ORDER — FUROSEMIDE 10 MG/ML IJ SOLN
40.0000 mg | Freq: Every day | INTRAMUSCULAR | Status: DC
  Administered 2021-10-06: 40 mg via INTRAVENOUS
  Filled 2021-10-05: qty 4

## 2021-10-05 MED ORDER — SERTRALINE HCL 50 MG PO TABS
25.0000 mg | ORAL_TABLET | Freq: Every day | ORAL | Status: DC
  Administered 2021-10-06 – 2021-10-07 (×2): 25 mg via ORAL
  Filled 2021-10-05 (×2): qty 1

## 2021-10-05 MED ORDER — ONDANSETRON HCL 4 MG/2ML IJ SOLN
4.0000 mg | Freq: Four times a day (QID) | INTRAMUSCULAR | Status: DC | PRN
Start: 2021-10-05 — End: 2021-10-08

## 2021-10-05 MED ORDER — ATORVASTATIN CALCIUM 40 MG PO TABS
40.0000 mg | ORAL_TABLET | Freq: Every day | ORAL | Status: DC
  Administered 2021-10-06 – 2021-10-07 (×2): 40 mg via ORAL
  Filled 2021-10-05 (×2): qty 1

## 2021-10-05 MED ORDER — METHYLPREDNISOLONE SODIUM SUCC 125 MG IJ SOLR
125.0000 mg | Freq: Once | INTRAMUSCULAR | Status: AC
  Administered 2021-10-05: 125 mg via INTRAVENOUS
  Filled 2021-10-05: qty 2

## 2021-10-05 MED ORDER — ISOSORBIDE MONONITRATE ER 30 MG PO TB24
30.0000 mg | ORAL_TABLET | Freq: Every day | ORAL | Status: DC
  Administered 2021-10-05 – 2021-10-07 (×3): 30 mg via ORAL
  Filled 2021-10-05 (×4): qty 1

## 2021-10-05 MED ORDER — ALBUTEROL SULFATE (2.5 MG/3ML) 0.083% IN NEBU: 2.5000 mg | INHALATION_SOLUTION | RESPIRATORY_TRACT | Status: DC | PRN

## 2021-10-05 MED ORDER — ALLOPURINOL 300 MG PO TABS
300.0000 mg | ORAL_TABLET | Freq: Every day | ORAL | Status: DC
  Administered 2021-10-06 – 2021-10-07 (×2): 300 mg via ORAL
  Filled 2021-10-05 (×2): qty 1

## 2021-10-05 MED ORDER — MELATONIN 5 MG PO TABS
10.0000 mg | ORAL_TABLET | Freq: Every day | ORAL | Status: DC
Start: 2021-10-05 — End: 2021-10-08
  Administered 2021-10-05 – 2021-10-06 (×2): 10 mg via ORAL
  Filled 2021-10-05 (×2): qty 2

## 2021-10-05 MED ORDER — HYDROCORTISONE 1 % EX CREA
1.0000 "application " | TOPICAL_CREAM | Freq: Two times a day (BID) | CUTANEOUS | Status: DC | PRN
  Filled 2021-10-05: qty 28

## 2021-10-05 MED ORDER — AMITRIPTYLINE HCL 10 MG PO TABS
10.0000 mg | ORAL_TABLET | Freq: Every day | ORAL | Status: DC
  Administered 2021-10-05 – 2021-10-06 (×2): 10 mg via ORAL
  Filled 2021-10-05 (×3): qty 1

## 2021-10-05 MED ORDER — LEVOTHYROXINE SODIUM 75 MCG PO TABS
75.0000 ug | ORAL_TABLET | Freq: Every day | ORAL | Status: DC
  Administered 2021-10-06: 75 ug via ORAL
  Filled 2021-10-05: qty 1

## 2021-10-05 NOTE — ED Triage Notes (Signed)
Pt arrives via EMS on Cpap for c/o possible CHF exacerbation. EMS reports breathing was labored upon arrival and pale. Pt c/o orthopnea  last night and increased swelling in BLE. Pt received 2 nitro in route, 0.5, Atrovent, and 10 of albuterol in route.

## 2021-10-05 NOTE — Progress Notes (Signed)
Pt has PRN BIPAP orders. No distress noted at this time.

## 2021-10-05 NOTE — H&P (Signed)
History and Physical    Stephanie Charles KDT:[id number] DOB: [birth year] DOA: 10/05/2021  PCP: Jerrol Banana., MD (Confirm with patient/family/NH records and if not entered, this has to be entered at [facility] point of entry) Patient coming from: Home  I have personally briefly reviewed patient's old medical records in [place]  Chief Complaint: SOB  HPI: Stephanie Charles is a 88 y.o. female with medical history significant of CAD with NSTEMI status post stenting 2020, history of chronic systolic CHF secondary to ischemic cardiomyopathy with recovered LVEF 50-55% on Entresto, CKD stage IIIb, HTN, hypothyroidism, preented with uncontrolled hypertension and increasing shortness of breath.  Patient was recently hospitalized earlier this month for C. difficile colitis near syncope, and some order for blood pressure medication were held after hospitalization.  On 12/27, patient was seen by cardiology and adjustment of BP/CHF management, Coreg increased to 25 mg twice daily, Entresto 45/51 continue.  And patient reported that last week she traveled to [name], and came back 3 days ago, and since she found her blood pressure has not been controlled and SBP 150-160s.  Increasingly she started to feel shortness of breath, exertional with short distance.  No cough no chest pains no fever or chills.  She noticed only minimum of ankle edema.  She said she probably gained some weight but does not know how much.  She called EMS this morning for increasing shortness of breath, EMS arrived and found patient SBP 180s.  Patient was given nitroglycerin, albuterol and placed on CPAP.  ED Course: Blood pressure continue to be high, and bradycardia with EKG: Heart rate 36, then recovered to 50s, chest x-ray showed pulmonary congestion.  Patient was given 1 dose of 40 mg IV Lasix and urine output about 400 and patient reported feeling better.  Review of Systems: As per HPI otherwise 14 point review of systems  negative.    Past Medical History:  Diagnosis Date   Anemia    CHF (congestive heart failure) ([place]) 10/05/2021   Diverticulitis    GERD (gastroesophageal reflux disease)    Hyperlipidemia    Hypertension    Hypothyroidism    Myocardial infarction ([place]) 08/2017   TIA (transient ischemic attack)    1 approx 2015, 1 approx 2018   Vertigo    last episode several months ago   [name]     Past Surgical History:  Procedure Laterality Date   COLONOSCOPY WITH PROPOFOL N/A 06/22/2018   Procedure: COLONOSCOPY WITH PROPOFOL;  Surgeon: Jonathon Bellows, MD;  Location: [facility];  Service: Endoscopy;  Laterality: N/A;   CORONARY STENT INTERVENTION N/A 06/07/2019   Procedure: CORONARY STENT INTERVENTION;  Surgeon: [name], MD;  Location: [place] [facility];  Service: Cardiovascular;  Laterality: N/A;   ESOPHAGOGASTRODUODENOSCOPY (EGD) WITH PROPOFOL N/A 06/22/2018   Procedure: ESOPHAGOGASTRODUODENOSCOPY (EGD) WITH PROPOFOL;  Surgeon: Jonathon Bellows, MD;  Location: [facility] ENDOSCOPY;  Service: Endoscopy;  Laterality: N/A;   KNEE ARTHROSCOPY Right    REPLACEMENT TOTAL KNEE BILATERAL     RIGHT/LEFT HEART CATH AND CORONARY ANGIOGRAPHY N/A 06/07/2019   Procedure: RIGHT/LEFT HEART CATH AND CORONARY ANGIOGRAPHY;  Surgeon: [name], MD;  Location: [place] [facility];  Service: Cardiovascular;  Laterality: N/A;   SHOULDER SURGERY Left      reports that she has never smoked. She has never used smokeless tobacco. She reports that she does not drink alcohol and does not use drugs.  Allergies  Allergen Reactions  Latex Itching   Levofloxacin     Mouth sores   Lisinopril     Cough?   Povidone-Iodine Other (See Comments)    Severe blistering and itchiness. Severe redness   Simvastatin     Myalgias   Codeine Nausea And Vomiting    Family History  Problem Relation Age of Onset   Heart disease Mother    Hypertension Mother    Stroke Mother    Heart disease Father     Breast cancer Sister    Alzheimer's disease Brother    Heart attack Brother    Stroke Brother    Heart disease Brother    Mental illness Brother        died suicide   Cancer Brother        prostate   Heart disease Brother        atrial fib   Alzheimer's disease Brother    Clotting disorder Daughter    Fibromyalgia Daughter      Prior to Admission medications   Medication Sig Start Date End Date Taking? Authorizing Provider  albuterol (PROVENTIL) (2.5 MG/3ML) 0.083% nebulizer solution USE 1 VIAL IN NEBULIZER EVERY 4 HOURS AS NEEDED Patient taking differently: Take 2.5 mg by nebulization every 4 (four) hours as needed for wheezing or shortness of breath. 06/17/21  Yes Jerrol Banana., MD  allopurinol (ZYLOPRIM) 300 MG tablet Take 1 tablet by mouth once daily Patient taking differently: Take 300 mg by mouth daily. 09/30/21  Yes Jerrol Banana., MD  amitriptyline (ELAVIL) 10 MG tablet TAKE 1 TABLET BY MOUTH AT BEDTIME Patient taking differently: Take 10 mg by mouth at bedtime. 09/29/21  Yes Jerrol Banana., MD  ASPIRIN LOW DOSE 81 MG EC tablet NEW PRESCRIPTION REQUEST: TAKE ONE TABLET BY MOUTH EVERY DAY Patient taking differently: Take 81 mg by mouth daily. 08/03/21  Yes Jerrol Banana., MD  atorvastatin (LIPITOR) 40 MG tablet Take 1 tablet by mouth once daily Patient taking differently: Take 40 mg by mouth daily. 09/30/21  Yes Jerrol Banana., MD  carvedilol (COREG) 25 MG tablet Take 1 tablet (25 mg total) by mouth 2 (two) times daily. 09/29/21  Yes Dunn, Areta Haber, PA-C  clopidogrel (PLAVIX) 75 MG tablet NEW PRESCRIPTION REQUEST: TAKE ONE TABLET BY MOUTH EVERY DAY Patient taking differently: Take 75 mg by mouth daily. 07/30/21  Yes Jerrol Banana., MD  Ferrous Sulfate 27 MG TABS Take 27 mg by mouth at bedtime.   Yes [provider]  hydrocortisone 2.5 % cream Apply 1 application topically 2 (two) times daily as needed (for skin  irritation). 02/05/21  Yes [provider]  levothyroxine (SYNTHROID) 137 MCG tablet NEW PRESCRIPTION REQUEST: TAKE ONE TABLET BY MOUTH EVERY DAY IN THE MORNING Patient taking differently: Take 137 mcg by mouth daily before breakfast. 08/03/21  Yes Jerrol Banana., MD  Melatonin 10 MG TABS Take 20 mg by mouth at bedtime.   Yes [provider]  mirtazapine (REMERON) 30 MG tablet NEW PRESCRIPTION REQUEST: TAKE ONE TABLET BY MOUTH AT BEDTIME Patient taking differently: Take 30 mg by mouth at bedtime. 07/30/21  Yes Jerrol Banana., MD  Multiple Vitamin (MULTIVITAMIN) capsule Take 1 capsule by mouth every evening.   Yes [provider]  pantoprazole (PROTONIX) 40 MG tablet NEW PRESCRIPTION REQUEST: TAKE ONE TABLET BY MOUTH EVERY DAY Patient taking differently: Take 40 mg by mouth daily. 07/30/21  Yes Eulas Post  Brooke Bonito., MD  sacubitril-valsartan (ENTRESTO) 49-51 MG Take 1 tablet by mouth 2 (two) times daily. 09/15/21  Yes Dunn, Ryan M, PA-C  sertraline (ZOLOFT) 25 MG tablet NEW PRESCRIPTION REQUEST: TAKE ONE TABLET BY MOUTH EVERY DAY Patient taking differently: Take 25 mg by mouth daily. 07/30/21  Yes Jerrol Banana., MD  torsemide (DEMADEX) 20 MG tablet Take 1 tablet (20 mg total) by mouth daily as needed (As needed for shortness of breath, swelling, or weight gain). 09/15/21 12/14/21 Yes [name], PA-C    Physical Exam: Vitals:   10/05/21 1430 10/05/21 1500 10/05/21 1515 10/05/21 1530  BP: (!) 143/55 (!) 147/56 (!) 155/83 (!) 163/88  Pulse: (!) 55 (!) 52 (!) 51 63  Resp: 15 16 16 20   Temp:      TempSrc:      SpO2: 95% 96% 97% 98%  Weight:      Height:        Constitutional: NAD, calm, comfortable Vitals:   10/05/21 1430 10/05/21 1500 10/05/21 1515 10/05/21 1530  BP: (!) 143/55 (!) 147/56 (!) 155/83 (!) 163/88  Pulse: (!) 55 (!) 52 (!) 51 63  Resp: 15 16 16 20   Temp:      TempSrc:      SpO2: 95% 96% 97% 98%  Weight:      Height:        Eyes: PERRL, lids and conjunctivae normal ENMT: Mucous membranes are moist. Posterior pharynx clear of any exudate or lesions.Normal dentition.  Neck: normal, supple, no masses, no thyromegaly Respiratory: clear to auscultation bilaterally, no wheezing, fine crackles on bilateral lower fields.  Increasing respiratory effort. No accessory muscle use.  Cardiovascular: Regular rate and rhythm, no murmurs / rubs / gallops. No extremity edema. 2+ pedal pulses. No carotid bruits.  Abdomen: no tenderness, no masses palpated. No hepatosplenomegaly. Bowel sounds positive.  Musculoskeletal: no clubbing / cyanosis. No joint deformity upper and lower extremities. Good ROM, no contractures. Normal muscle tone.  Skin: no rashes, lesions, ulcers. No induration Neurologic: CN 2-12 grossly intact. Sensation intact, DTR normal. Strength 5/5 in all 4.  Psychiatric: Normal judgment and insight. Alert and oriented x 3. Normal mood.     Labs on Admission: I have personally reviewed following labs and imaging studies  CBC: Recent Labs  Lab 10/05/21 1049  WBC 7.9  NEUTROABS 6.3  HGB 12.0  HCT 36.9  MCV 96.6  PLT 737*   Basic Metabolic Panel: Recent Labs  Lab 09/29/21 1516 10/05/21 1049  NA 142 139  K 4.5 4.0  CL 101 105  CO2 27 24  GLUCOSE 112* 122*  BUN 17 16  CREATININE 1.27* 1.30*  CALCIUM 9.2 8.9  MG  --  1.9   GFR: Estimated Creatinine Clearance: 27.8 mL/min (A) (by C-G formula based on SCr of 1.3 mg/dL (H)). Liver Function Tests: Recent Labs  Lab 10/05/21 1049  AST 20  ALT 16  ALKPHOS 76  BILITOT 0.9  PROT 5.6*  ALBUMIN 3.5   No results for input(s): LIPASE, AMYLASE in the last 168 hours. No results for input(s): AMMONIA in the last 168 hours. Coagulation Profile: No results for input(s): INR, PROTIME in the last 168 hours. Cardiac Enzymes: No results for input(s): CKTOTAL, CKMB, CKMBINDEX, TROPONINI in the last 168 hours. BNP (last 3 results) No results for input(s):  PROBNP in the last 8760 hours. HbA1C: No results for input(s): HGBA1C in the last 72 hours. CBG: No results for input(s): GLUCAP in the last 168  hours. Lipid Profile: No results for input(s): CHOL, HDL, LDLCALC, TRIG, CHOLHDL, LDLDIRECT in the last 72 hours. Thyroid Function Tests: No results for input(s): TSH, T4TOTAL, FREET4, T3FREE, THYROIDAB in the last 72 hours. Anemia Panel: No results for input(s): VITAMINB12, FOLATE, FERRITIN, TIBC, IRON, RETICCTPCT in the last 72 hours. Urine analysis:    Component Value Date/Time   COLORURINE STRAW (A) 09/10/2021 0635   APPEARANCEUR CLEAR 09/10/2021 0635   APPEARANCEUR Clear 07/11/2018 1326   LABSPEC 1.015 09/10/2021 0635   LABSPEC 1.011 01/22/2013 1416   PHURINE 6.0 09/10/2021 0635   GLUCOSEU NEGATIVE 09/10/2021 0635   GLUCOSEU Negative 01/22/2013 1416   HGBUR NEGATIVE 09/10/2021 0635   BILIRUBINUR NEGATIVE 09/10/2021 0635   BILIRUBINUR Negative 07/11/2018 1326   BILIRUBINUR Negative 01/22/2013 1416   KETONESUR NEGATIVE 09/10/2021 0635   PROTEINUR NEGATIVE 09/10/2021 0635   UROBILINOGEN 0.2 03/22/2018 1053   NITRITE NEGATIVE 09/10/2021 0635   LEUKOCYTESUR SMALL (A) 09/10/2021 0635   LEUKOCYTESUR 3+ 01/22/2013 1416    Radiological Exams on Admission: DG Chest Port 1 View  Result Date: 10/05/2021 CLINICAL DATA:  Shortness of breath possible CHF exacerbation EXAM: PORTABLE CHEST 1 VIEW COMPARISON:  Chest radiograph dated September 17, 2021 FINDINGS: The heart is enlarged. Atherosclerotic calcification of the aortic arch. Pulmonary vascular congestion without focal consolidation or pleural effusion. Low lung volumes. IMPRESSION: 1. Stable cardiomegaly. 2. Pulmonary vascular congestion without focal consolidation, frank pulmonary edema or large pleural effusion. Electronically Signed   By: Keane Police D.O.   On: 10/05/2021 11:38    EKG: Independently reviewed.  Chronic LBBB, sinus bradycardia  Assessment/Plan Principal Problem:   CHF  (congestive heart failure) (HCC) Active Problems:   Acute on chronic combined systolic and diastolic congestive heart failure ([place])  (please populate well all problems here in Problem List. (For example, if patient is on BP meds at home and you resume or decide to hold them, it is a problem that needs to be her. Same for CAD, COPD, HLD and so on)  Acute hypoxic respiratory failure -Secondary to acute on chronic systolic CHF decompensation -Likely secondary from uncontrolled hypertension.  Review of her chart found that earlier December, she was on Coreg 12.5 twice daily, isosorbide dinitrate 30 mg daily, amlodipine 10 mg, Entresto 49/51, HCTZ and torsemide.  And patient was off some of the BP medication including Isordil, amlodipine, HCTZ and torsemide due to hypotension/near syncope.   -On 12/27 cardiology note, appears that cardiology increased Coreg 25 12.5 to 25 mg twice daily, given today's significant bradycardia, will decrease Coreg 25 mg back to 12.5 mg BID -Check Echo, but will not restart CCB until we know her updated EF. -Will not restart HCTZ due to her CKD stage III status. -Since in the past she tolerated isosorbide dinitrate, will consider Imdur plus hydralazine regimen given her CHF history.  For now, start Imdur 30 mg daily, start as needed hydralazine (for SBP>150 or DBP>100) which can be switched to standing dose if her BP has good response. -Continue Entresto at home dosage -Plan to continue IV Lasix for 1-2 days, will check chest x-ray every day -Cardiology was paged in ED and waiting for a response.  HTN, uncontrolled -As above  Bradycardia -Cut down Coreg dosage as above.  Hypothyroidism -She is on a large dose of Synthroid of 137 mcg given her CHF Hx, TSH 0.05 in June 2022, and her Synthroid dosage was not adjusted at that point, for now we will cut Synthroid dosage to 75 mg  daily and recheck TSH in 3 to 4 weeks.  CKD stage IIIb -Now has fluid overload, creatinine  level stable, IV Lasix and follow-up daily BMP.  DVT prophylaxis: Heparin subcu Code Status: Partial code, status not wishing to be intubated Family Communication: None at bedside Disposition Plan: Patient has complicated CHF problems complicated by her CKD and labile BP problem, expect more than 2 midnight hospital stay. Consults called: Cardiology paged in ED Admission status: Telemetry admission   Lequita Halt MD [facility] Pager [phone]  10/05/2021, 4:16 PM

## 2021-10-05 NOTE — ED Provider Notes (Signed)
[facility] EMERGENCY DEPARTMENT Provider Note   CSN: [id number] Arrival date & time: 10/05/21  1038     History  Chief Complaint  Patient presents with   Respiratory Distress   Leg Swelling    Stephanie Charles is a 88 y.o. female.  HPI  88 year old female with past medical history of HTN, HLD, CAD, previous MI, CHF, CKD presents the emergency department concern for shortness of breath.  Patient arrived by EMS on CPAP.  History is limited secondary to acuity and CPAP.  Patient had a recent admission C. difficile infection.  She states since being home she has declined, worsening shortness of breath.  She denies any active chest pain, back pain, abdominal pain.  States she has an intermittent productive cough and has been having worsening swelling of her lower extremities.  States she is been compliant with her medications.  Currently complaining of discomfort from the CPAP mask.  No family at bedside.  Home Medications Prior to Admission medications   Medication Sig Start Date End Date Taking? Authorizing Provider  albuterol (PROVENTIL) (2.5 MG/3ML) 0.083% nebulizer solution USE 1 VIAL IN NEBULIZER EVERY 4 HOURS AS NEEDED Patient taking differently: Take 2.5 mg by nebulization every 4 (four) hours as needed for wheezing or shortness of breath. 06/17/21  Yes Jerrol Banana., MD  allopurinol (ZYLOPRIM) 300 MG tablet Take 1 tablet by mouth once daily Patient taking differently: Take 300 mg by mouth daily. 09/30/21  Yes Jerrol Banana., MD  amitriptyline (ELAVIL) 10 MG tablet TAKE 1 TABLET BY MOUTH AT BEDTIME Patient taking differently: Take 10 mg by mouth at bedtime. 09/29/21  Yes Jerrol Banana., MD  ASPIRIN LOW DOSE 81 MG EC tablet NEW PRESCRIPTION REQUEST: TAKE ONE TABLET BY MOUTH EVERY DAY Patient taking differently: Take 81 mg by mouth daily. 08/03/21  Yes Jerrol Banana., MD  atorvastatin (LIPITOR) 40 MG tablet Take 1 tablet by mouth once  daily Patient taking differently: Take 40 mg by mouth daily. 09/30/21  Yes Jerrol Banana., MD  carvedilol (COREG) 25 MG tablet Take 1 tablet (25 mg total) by mouth 2 (two) times daily. 09/29/21  Yes Dunn, Areta Haber, PA-C  clopidogrel (PLAVIX) 75 MG tablet NEW PRESCRIPTION REQUEST: TAKE ONE TABLET BY MOUTH EVERY DAY Patient taking differently: Take 75 mg by mouth daily. 07/30/21  Yes Jerrol Banana., MD  Ferrous Sulfate 27 MG TABS Take 27 mg by mouth at bedtime.   Yes [provider]  hydrocortisone 2.5 % cream Apply 1 application topically 2 (two) times daily as needed (for skin irritation). 02/05/21  Yes [provider]  levothyroxine (SYNTHROID) 137 MCG tablet NEW PRESCRIPTION REQUEST: TAKE ONE TABLET BY MOUTH EVERY DAY IN THE MORNING Patient taking differently: Take 137 mcg by mouth daily before breakfast. 08/03/21  Yes Jerrol Banana., MD  Melatonin 10 MG TABS Take 20 mg by mouth at bedtime.   Yes [provider]  mirtazapine (REMERON) 30 MG tablet NEW PRESCRIPTION REQUEST: TAKE ONE TABLET BY MOUTH AT BEDTIME Patient taking differently: Take 30 mg by mouth at bedtime. 07/30/21  Yes Jerrol Banana., MD  Multiple Vitamin (MULTIVITAMIN) capsule Take 1 capsule by mouth every evening.   Yes [provider]  pantoprazole (PROTONIX) 40 MG tablet NEW PRESCRIPTION REQUEST: TAKE ONE TABLET BY MOUTH EVERY DAY Patient taking differently: Take 40 mg by mouth daily. 07/30/21  Yes Jerrol Banana., MD  sacubitril-valsartan (ENTRESTO) 49-51 MG Take 1 tablet by mouth 2 (two) times daily. 09/15/21  Yes Dunn, Ryan M, PA-C  sertraline (ZOLOFT) 25 MG tablet NEW PRESCRIPTION REQUEST: TAKE ONE TABLET BY MOUTH EVERY DAY Patient taking differently: Take 25 mg by mouth daily. 07/30/21  Yes Jerrol Banana., MD  torsemide (DEMADEX) 20 MG tablet Take 1 tablet (20 mg total) by mouth daily as needed (As needed for shortness of breath, swelling, or  weight gain). 09/15/21 12/14/21 Yes Dunn, Areta Haber, PA-C      Allergies    Latex, Levofloxacin, Lisinopril, Povidone-iodine, Simvastatin, and Codeine    Review of Systems   Review of Systems  Reason unable to perform ROS: Limited secondary to CPAP and acuity.  Constitutional:  Negative for fever.  Respiratory:  Positive for cough, chest tightness and shortness of breath.   Cardiovascular:  Positive for leg swelling. Negative for chest pain and palpitations.  Gastrointestinal:  Negative for abdominal pain.  Genitourinary:  Negative for flank pain.  Musculoskeletal:  Negative for back pain.  Neurological:  Negative for headaches.   Physical Exam Updated Vital Signs BP (!) 141/59    Pulse (!) 49    Temp 98.6 F (37 C) (Axillary)    Resp 19    Ht 5\' 2"  (1.575 m)    Wt 74.8 kg    SpO2 99%    BMI 30.18 kg/m  Physical Exam Vitals and nursing note reviewed.  Constitutional:      General: She is in acute distress.     Appearance: Normal appearance.  HENT:     Head: Normocephalic.     Mouth/Throat:     Mouth: Mucous membranes are moist.  Eyes:     Pupils: Pupils are equal, round, and reactive to light.  Cardiovascular:     Rate and Rhythm: Bradycardia present.  Pulmonary:     Comments: Significantly diminished breath sounds bilaterally with rales, with scattered wheezes.  Arrived on CPAP. Abdominal:     Palpations: Abdomen is soft.     Tenderness: There is no abdominal tenderness.  Musculoskeletal:        General: Swelling present.  Skin:    General: Skin is warm.  Neurological:     Mental Status: She is alert and oriented to person, place, and time. Mental status is at baseline.  Psychiatric:        Mood and Affect: Mood normal.    ED Results / Procedures / Treatments   Labs (all labs ordered are listed, but only abnormal results are displayed) Labs Reviewed  CBC WITH DIFFERENTIAL/PLATELET - Abnormal; Notable for the following components:      Result Value   RBC 3.82 (*)     Platelets 122 (*)    All other components within normal limits  RESP PANEL BY RT-PCR (FLU A&B, COVID) [id number]  COMPREHENSIVE METABOLIC PANEL  BRAIN NATRIURETIC PEPTIDE  MAGNESIUM  TROPONIN I (HIGH SENSITIVITY)    EKG EKG Interpretation  Date/Time:  Monday October 05 2021 10:47:27 EST Ventricular Rate:  55 PR Interval:  229 QRS Duration: 151 QT Interval:  487 QTC Calculation: 466 R Axis:   -20 Text Interpretation: Sinus rhythm Prolonged PR interval Left bundle branch block LBBB old Confirmed by Lavenia Atlas [phone]) on 10/05/2021 10:53:41 AM  Radiology DG Chest Port 1 View  Result Date: 10/05/2021 CLINICAL DATA:  Shortness of breath possible CHF exacerbation EXAM: PORTABLE CHEST 1 VIEW COMPARISON:  Chest radiograph dated September 17, 2021 FINDINGS: The heart is  enlarged. Atherosclerotic calcification of the aortic arch. Pulmonary vascular congestion without focal consolidation or pleural effusion. Low lung volumes. IMPRESSION: 1. Stable cardiomegaly. 2. Pulmonary vascular congestion without focal consolidation, frank pulmonary edema or large pleural effusion. Electronically Signed   By: Keane Police D.O.   On: 10/05/2021 11:38    Procedures .Critical Care Performed by: Lorelle Gibbs, DO Authorized by: Lorelle Gibbs, DO   Critical care provider statement:    Critical care time (minutes):  60   Critical care time was exclusive of:  Separately billable procedures and treating other patients   Critical care was necessary to treat or prevent imminent or life-threatening deterioration of the following conditions:  Respiratory failure and cardiac failure   Critical care was time spent personally by me on the following activities:  Development of treatment plan with patient or surrogate, discussions with consultants, evaluation of patient's response to treatment, examination of patient, ordering and review of laboratory studies, ordering and review of radiographic studies, ordering  and performing treatments and interventions, pulse oximetry, re-evaluation of patient's condition and review of old charts   I assumed direction of critical care for this patient from another provider in my specialty: no     Care discussed with: admitting provider      Medications Ordered in ED Medications  furosemide (LASIX) injection 40 mg (has no administration in time range)  methylPREDNISolone sodium succinate (SOLU-MEDROL) 125 mg/2 mL injection 125 mg (125 mg Intravenous Given 10/05/21 1121)    ED Course/ Medical Decision Making/ A&P                            This patient presents to the ED for concern of shortness of breath, this involves an extensive number of treatment options, and is a complaint that carries with it a high risk of complications and morbidity.  The differential diagnosis includes asthma exacerbation, CHF exacerbation, pulmonary congestion, pneumonia, PE.   Additional history obtained: -Additional history obtained from sister at bedside -External records from outside source obtained and reviewed including chart review, previous notes, labs and imaging   Lab Tests: -I ordered, reviewed, and interpreted labs.  The pertinent results include: Blood work showing an elevated BNP, elevated but stable troponin, mild AKI, flu and COVID-negative.     Imaging Studies ordered: -I ordered imaging studies including chest x-ray -I independently visualized and interpreted imaging which showed pulmonary vascular congestion with cardiomegaly -I agree with the radiologist interpretation   Medicines ordered and prescription drug management: -I ordered medication including Lasix for diuresis -Reevaluation of the patient after these medicines showed that the patient improved -I have reviewed the patients home medicines and have made adjustments as needed   Problem List / ED Course: Patient arrived on CPAP, was transitioned to BiPAP with significantly diminished breath sounds  bilaterally.  EKG shows sinus bradycardia, heart rates mainly 40s to 50s with stable blood pressure, patient is awake and responsive.  States that she recently had outpatient Holter monitor done of which I cannot find the results for with a history of bradycardia.  Prior to arrival with EMS she had been treated with a breathing treatment and steroids on arrival.  However she appears to be more fluid overloaded from my standpoint.  Blood work and chest x-ray support this.  She was diuresed, breath sounds improved and she was able to be weaned from BiPAP to nasal cannula.  Patient is now sitting up, conversational, appropriate  for admission to the hospitalist team.   Consultations Obtained: I placed a consult called to cardiology, hope to contact them about this patient's presentation, plan for medical admission with cardiology consultation.   Critical Interventions: BiPAP, consult admission   Cardiac Monitoring: The patient was maintained on a cardiac monitor.  I personally viewed and interpreted the cardiac monitored which showed an underlying rhythm of: Sinus bradycardia   Reevaluation: After the interventions noted above, I reevaluated the patient and found that they have :improved   Dispostion: Patients evaluation and results requires admission for further treatment and care.  Spoke with hospitalist Dr. Roosevelt Locks, reviewed patient's ED course and they accept admission.  Patient agrees with admission plan, offers no new complaints and is stable/unchanged at time of admit.    Final Clinical Impression(s) / ED Diagnoses Final diagnoses:  None    Rx / DC Orders ED Discharge Orders     None         Lorelle Gibbs, DO 10/05/21 1515

## 2021-10-05 NOTE — ED Notes (Signed)
Pt taken off of Cpap and placed on 4 L [place] at this time

## 2021-10-06 ENCOUNTER — Other Ambulatory Visit (HOSPITAL_COMMUNITY): Payer: Medicare Other

## 2021-10-06 ENCOUNTER — Encounter (HOSPITAL_COMMUNITY): Payer: Self-pay | Admitting: Internal Medicine

## 2021-10-06 ENCOUNTER — Inpatient Hospital Stay (HOSPITAL_COMMUNITY): Payer: Medicare Other

## 2021-10-06 DIAGNOSIS — I10 Essential (primary) hypertension: Secondary | ICD-10-CM

## 2021-10-06 DIAGNOSIS — N1832 Chronic kidney disease, stage 3b: Secondary | ICD-10-CM

## 2021-10-06 DIAGNOSIS — I5043 Acute on chronic combined systolic (congestive) and diastolic (congestive) heart failure: Secondary | ICD-10-CM

## 2021-10-06 DIAGNOSIS — R778 Other specified abnormalities of plasma proteins: Secondary | ICD-10-CM

## 2021-10-06 LAB — BASIC METABOLIC PANEL
Anion gap: 9 (ref 5–15)
BUN: 19 mg/dL (ref 8–23)
CO2: 25 mmol/L (ref 22–32)
Calcium: 8.6 mg/dL — ABNORMAL LOW (ref 8.9–10.3)
Chloride: 103 mmol/L (ref 98–111)
Creatinine, Ser: 1.28 mg/dL — ABNORMAL HIGH (ref 0.44–1.00)
GFR, Estimated: 40 mL/min — ABNORMAL LOW (ref >60)
Glucose, Bld: 152 mg/dL — ABNORMAL HIGH (ref 70–99)
Potassium: 3.5 mmol/L (ref 3.5–5.1)
Sodium: 137 mmol/L (ref 135–145)

## 2021-10-06 MED ORDER — HYDRALAZINE HCL 25 MG PO TABS
25.0000 mg | ORAL_TABLET | Freq: Three times a day (TID) | ORAL | Status: DC
  Administered 2021-10-06 – 2021-10-07 (×3): 25 mg via ORAL
  Filled 2021-10-06 (×3): qty 1

## 2021-10-06 MED ORDER — LEVOTHYROXINE SODIUM 25 MCG PO TABS
137.0000 ug | ORAL_TABLET | Freq: Every day | ORAL | Status: DC
  Administered 2021-10-07: 137 ug via ORAL
  Filled 2021-10-06: qty 1

## 2021-10-06 MED ORDER — ORAL CARE MOUTH RINSE
15.0000 mL | Freq: Two times a day (BID) | OROMUCOSAL | Status: DC
  Administered 2021-10-06 – 2021-10-07 (×3): 15 mL via OROMUCOSAL

## 2021-10-06 NOTE — Progress Notes (Signed)
PROGRESS NOTE    Stephanie Charles  JSH:[id number] DOB: 1930-05-09 DOA: 10/05/2021 PCP: Jerrol Banana., MD    Brief Narrative:  Stephanie Charles is a 88 y.o. female with medical history significant of CAD with NSTEMI status post stenting 2020, history of chronic systolic CHF secondary to ischemic cardiomyopathy with recovered LVEF 50-55% on Entresto, CKD stage IIIb, HTN, hypothyroidism, preented with uncontrolled hypertension and increasing shortness of breath.    Consultants:    Procedures:   Antimicrobials:      Subjective: Feeling little better. Not at base line from sob stand point. No cp.  Objective: Vitals:   10/05/21 2039 10/06/21 0042 10/06/21 0440 10/06/21 0800  BP:  (!) 155/68 (!) 152/95 (!) 147/74  Pulse:  75 75 73  Resp:  17 17 20   Temp:  98.2 F (36.8 C) (!) 97.5 F (36.4 C) 98 F (36.7 C)  TempSrc:  Oral Oral Oral  SpO2: 95% 95% 94% 92%  Weight:   73.3 kg   Height:        Intake/Output Summary (Last 24 hours) at 10/06/2021 1245 Last data filed at 10/06/2021 0943 Gross per 24 hour  Intake 483 ml  Output 200 ml  Net 283 ml   Filed Weights   10/05/21 1045 10/05/21 2013 10/06/21 0440  Weight: 74.8 kg 73.8 kg 73.3 kg    Examination:  General exam: Appears calm and comfortable  Respiratory system: Decreased breath sounds at bases Cardiovascular system: S1 & S2 heard, RRR. No JVD, murmurs, rubs, gallops or clicks.  Gastrointestinal system: Abdomen is nondistended, soft and nontender. No organomegaly or masses felt. Normal bowel sounds heard. Central nervous system: Alert and oriented.  Grossly intact Extremities: No edema Psychiatry: Judgement and insight appear normal. Mood & affect appropriate.     Data Reviewed: I have personally reviewed following labs and imaging studies  CBC: Recent Labs  Lab 10/05/21 1049  WBC 7.9  NEUTROABS 6.3  HGB 12.0  HCT 36.9  MCV 96.6  PLT 850*   Basic Metabolic Panel: Recent Labs  Lab 09/29/21 1516  10/05/21 1049 10/06/21 0436  NA 142 139 137  K 4.5 4.0 3.5  CL 101 105 103  CO2 27 24 25   GLUCOSE 112* 122* 152*  BUN 17 16 19   CREATININE 1.27* 1.30* 1.28*  CALCIUM 9.2 8.9 8.6*  MG  --  1.9  --    GFR: Estimated Creatinine Clearance: 27.9 mL/min (A) (by C-G formula based on SCr of 1.28 mg/dL (H)). Liver Function Tests: Recent Labs  Lab 10/05/21 1049  AST 20  ALT 16  ALKPHOS 76  BILITOT 0.9  PROT 5.6*  ALBUMIN 3.5   No results for input(s): LIPASE, AMYLASE in the last 168 hours. No results for input(s): AMMONIA in the last 168 hours. Coagulation Profile: No results for input(s): INR, PROTIME in the last 168 hours. Cardiac Enzymes: No results for input(s): CKTOTAL, CKMB, CKMBINDEX, TROPONINI in the last 168 hours. BNP (last 3 results) No results for input(s): PROBNP in the last 8760 hours. HbA1C: No results for input(s): HGBA1C in the last 72 hours. CBG: No results for input(s): GLUCAP in the last 168 hours. Lipid Profile: No results for input(s): CHOL, HDL, LDLCALC, TRIG, CHOLHDL, LDLDIRECT in the last 72 hours. Thyroid Function Tests: Recent Labs    10/05/21 1529  TSH 4.121   Anemia Panel: No results for input(s): VITAMINB12, FOLATE, FERRITIN, TIBC, IRON, RETICCTPCT in the last 72 hours. Sepsis Labs: No results for input(s):  PROCALCITON, LATICACIDVEN in the last 168 hours.  Recent Results (from the past 240 hour(s))  Resp Panel by RT-PCR (Flu A&B, Covid) Nasopharyngeal Swab     Status: None   Collection Time: 10/05/21 11:24 AM   Specimen: Nasopharyngeal Swab; Nasopharyngeal(NP) swabs in vial transport medium  Result Value Ref Range Status   SARS Coronavirus 2 by RT PCR NEGATIVE NEGATIVE Final    Comment: (NOTE) SARS-CoV-2 target nucleic acids are NOT DETECTED.  The SARS-CoV-2 RNA is generally detectable in upper respiratory specimens during the acute phase of infection. The lowest concentration of SARS-CoV-2 viral copies this assay can detect is 138  copies/mL. A negative result does not preclude SARS-Cov-2 infection and should not be used as the sole basis for treatment or other patient management decisions. A negative result may occur with  improper specimen collection/handling, submission of specimen other than nasopharyngeal swab, presence of viral mutation(s) within the areas targeted by this assay, and inadequate number of viral copies(<138 copies/mL). A negative result must be combined with clinical observations, patient history, and epidemiological information. The expected result is Negative.  Fact Sheet for Patients:  EntrepreneurPulse.com.au  Fact Sheet for Healthcare Providers:  IncredibleEmployment.be  This test is no t yet approved or cleared by the [place] FDA and  has been authorized for detection and/or diagnosis of SARS-CoV-2 by FDA under an Emergency Use Authorization (EUA). This EUA will remain  in effect (meaning this test can be used) for the duration of the COVID-19 declaration under Section 564(b)(1) of the Act, 21 U.S.C.section 360bbb-3(b)(1), unless the authorization is terminated  or revoked sooner.       Influenza A by PCR NEGATIVE NEGATIVE Final   Influenza B by PCR NEGATIVE NEGATIVE Final    Comment: (NOTE) The Xpert Xpress SARS-CoV-2/FLU/RSV plus assay is intended as an aid in the diagnosis of influenza from Nasopharyngeal swab specimens and should not be used as a sole basis for treatment. Nasal washings and aspirates are unacceptable for Xpert Xpress SARS-CoV-2/FLU/RSV testing.  Fact Sheet for Patients: EntrepreneurPulse.com.au  Fact Sheet for Healthcare Providers: IncredibleEmployment.be  This test is not yet approved or cleared by the [place] FDA and has been authorized for detection and/or diagnosis of SARS-CoV-2 by FDA under an Emergency Use Authorization (EUA). This EUA will remain in effect (meaning  this test can be used) for the duration of the COVID-19 declaration under Section 564(b)(1) of the Act, 21 U.S.C. section 360bbb-3(b)(1), unless the authorization is terminated or revoked.  Performed at [facility], [place] [address], [place], [place] [address]          Radiology Studies: DG Chest Port 1 View  Result Date: 10/05/2021 CLINICAL DATA:  Shortness of breath possible CHF exacerbation EXAM: PORTABLE CHEST 1 VIEW COMPARISON:  Chest radiograph dated September 17, 2021 FINDINGS: The heart is enlarged. Atherosclerotic calcification of the aortic arch. Pulmonary vascular congestion without focal consolidation or pleural effusion. Low lung volumes. IMPRESSION: 1. Stable cardiomegaly. 2. Pulmonary vascular congestion without focal consolidation, frank pulmonary edema or large pleural effusion. Electronically Signed   By: Keane Police D.O.   On: 10/05/2021 11:38        Scheduled Meds:  allopurinol  300 mg Oral Daily   amitriptyline  10 mg Oral QHS   aspirin EC  81 mg Oral Daily   atorvastatin  40 mg Oral Daily   carvedilol  12.5 mg Oral BID   clopidogrel  75 mg Oral Daily   enoxaparin (LOVENOX) injection  30 mg Subcutaneous Q24H   ferrous sulfate  325 mg Oral QHS   furosemide  40 mg Intravenous Daily   isosorbide mononitrate  30 mg Oral Daily   levothyroxine  75 mcg Oral Q0600   mouth rinse  15 mL Mouth Rinse BID   melatonin  10 mg Oral QHS   mirtazapine  30 mg Oral QHS   pantoprazole  40 mg Oral Daily   sacubitril-valsartan  1 tablet Oral BID   sertraline  25 mg Oral Daily   sodium chloride flush  3 mL Intravenous Q12H   Continuous Infusions:  sodium chloride      Assessment & Plan:   Principal Problem:   CHF (congestive heart failure) (HCC) Active Problems:   Acute on chronic combined systolic and diastolic congestive heart failure (HCC)   Acute hypoxic respiratory failure Acute on chronic combined systolic and diastolic heart failure -Clinically slowly  improving, quite not at baseline yet BP improving Continue IV Lasix I's and O's Daily weight Echo pending   Uncontrolled hypertension Improving DC IV hydralazine Start hydralazine 25 mg 3 times daily as tolerated Carvedilol dose was decreased due to bradycardia   Elevated troponin No chest pain Mildly elevated Likely due to demand ischemia from acute CHF   Chronic kidney disease stage IIIb At baseline Monitor closely with diuresis    DVT prophylaxis: Lovenox Code Status: Partial Family Communication: None at bedside Disposition Plan:  Status is: Inpatient  Remains inpatient appropriate because: IV treatment            LOS: 1 day   Time spent: 35 minutes with more than 50% on [place], MD [facility] Pager [phone]  If 7PM-7AM, please contact night-coverage 10/06/2021, 12:45 PM

## 2021-10-06 NOTE — Progress Notes (Signed)
PT Cancellation Note  Patient Details Name: Stephanie Charles MRN: [id number] DOB: 1930-09-24   Cancelled Treatment:    Reason Eval/Treat Not Completed: Other (comment). Patient reports that her daughter just passed away from brain cancer. She is tearful. Will hold PT until tomorrow. She is hoping to go home tomorrow now so she can travel to [place] for services.    Kiele Heavrin 10/06/2021, 2:17 PM

## 2021-10-06 NOTE — Evaluation (Signed)
Occupational Therapy Evaluation Patient Details Name: Stephanie Charles MRN: [id number] DOB: [birth year] Today's Date: 10/06/2021   History of Present Illness Pt is a 88 yr old female who presented due to uncontroled BP following a trip to [place].  PMH of CAD with NSTEMI status post stenting 2020, CHF, vertigo, TIA, MI,   Clinical Impression   Pt reported at [place] was I with all IADLS/ADLS and driving. Pt at this time was able to complete all ADLS with mod I to supervision for pacing/energy conservation techniques. Pt did not report and SOB and o2 remained from 94-89% on room air with activity and ADLS. Pt currently with functional limitations due to the deficits listed below (see OT Problem List).  Pt will benefit from skilled OT to increase their safety and independence with ADL and functional mobility for ADL to facilitate discharge to venue listed below.     BP: supine 149/66 Sitting: 149/102 Sitting for 5 mins: 155/66 Standing: 156/73 Post ADLs/ambulation: 147/74     Recommendations for follow up therapy are one component of a multi-disciplinary discharge planning process, led by the attending physician.  Recommendations may be updated based on patient status, additional functional criteria and insurance authorization.   Follow Up Recommendations  No OT follow up    Assistance Recommended at Discharge Intermittent Supervision/Assistance  Patient can return home with the following  (using energy conservastion techniques)    Functional Status Assessment  Patient has had a recent decline in their functional status and demonstrates the ability to make significant improvements in function in a reasonable and predictable amount of time.  Equipment Recommendations       Recommendations for Other Services       Precautions / Restrictions Precautions Precautions:  (BP/HR) Restrictions Weight Bearing Restrictions: No      Mobility Bed Mobility Overal bed mobility: Modified Independent  (increase time with positional changes)                  Transfers Overall transfer level: Modified independent Equipment used: None                      Balance Overall balance assessment: Mild deficits observed, not formally tested                                         ADL either performed or assessed with clinical judgement   ADL Overall ADL's : Needs assistance/impaired Eating/Feeding: Independent;Sitting   Grooming: Wash/dry hands;Wash/dry face;Supervision/safety;Standing   Upper Body Bathing: Cueing for sequencing;Cueing for safety;Modified independent;Sitting   Lower Body Bathing: Supervison/ safety;Cueing for safety;Cueing for sequencing;Sit to/from stand   Upper Body Dressing : Modified independent;Cueing for safety;Cueing for sequencing;Sitting   Lower Body Dressing: Supervision/safety;Sit to/from stand;Cueing for safety;Cueing for sequencing   Toilet Transfer: [place] and Hygiene: Supervision/safety;Cueing for safety;Cueing for sequencing;Sit to/from stand   Tub/ Banker: Supervision/safety;Cueing for sequencing;Cueing for safety   Functional mobility during ADLs: Supervision/safety;Cueing for safety;Cueing for sequencing       Vision         Perception     Praxis      Pertinent Vitals/Pain Pain Assessment: No/denies pain     Hand Dominance     Extremity/Trunk Assessment Upper Extremity Assessment Upper Extremity Assessment: RUE deficits/detail;LUE deficits/detail RUE Deficits / Details: AROM to about  130 of shoulder flexion for  BUE LUE Deficits / Details: AROM to about  130 of shoulder flexion for BUE   Lower Extremity Assessment Lower Extremity Assessment: Defer to PT evaluation   Cervical / Trunk Assessment Cervical / Trunk Assessment: Kyphotic   Communication Communication Communication: No difficulties   Cognition Arousal/Alertness:  Awake/alert Behavior During Therapy: WFL for tasks assessed/performed Overall Cognitive Status: Within Functional Limits for tasks assessed                                       General Comments       Exercises     Shoulder Instructions      Home Living Family/patient expects to be discharged to:: Private residence Living Arrangements: Children Available Help at Discharge: Family Type of Home: House Home Access: Stairs to enter     Home Layout: One level     Bathroom Shower/Tub: Tub/shower unit;Walk-in shower     Bathroom Accessibility: Yes   Home Equipment: Cane - single point          Prior Functioning/Environment Prior Level of Function : Independent/Modified Independent;Driving             Mobility Comments: reportd using no equipment          OT Problem List: Decreased activity tolerance;Impaired balance (sitting and/or standing);Decreased knowledge of use of DME or AE      OT Treatment/Interventions: Self-care/ADL training;DME and/or AE instruction;Therapeutic activities;Patient/family education;Balance training    OT Goals(Current goals can be found in the care plan section) Acute Rehab OT Goals Patient Stated Goal: to return home OT Goal Formulation: With patient Time For Goal Achievement: 10/24/21 Potential to Achieve Goals: Good ADL Goals Additional ADL Goal #1: Pt will be able to self report 3 stratigies with energy conservation with the return to home. Additional ADL Goal #2: Pt will be able to complete 15 min of standing simulation of ADLS/IADLS to increase endurance with the return to home  OT Frequency: Min 2X/week    Co-evaluation              AM-PAC OT "6 Clicks" Daily Activity     Outcome Measure Help from another person eating meals?: None Help from another person taking care of personal grooming?: None Help from another person toileting, which includes using toliet, bedpan, or urinal?: A Little Help from  another person bathing (including washing, rinsing, drying)?: A Little Help from another person to put on and taking off regular upper body clothing?: None Help from another person to put on and taking off regular lower body clothing?: A Little 6 Click Score: 21   End of Session Equipment Utilized During Treatment: Gait belt Nurse Communication: Mobility status;Other (comment) (BP/o2 readings)  Activity Tolerance: Patient tolerated treatment well Patient left: in bed;with call bell/phone within reach;with nursing/sitter in room  OT Visit Diagnosis: Unsteadiness on feet (R26.81)                Time: 7672-0947 OT Time Calculation (min): 31 min Charges:  OT General Charges $OT Visit: 1 Visit OT Evaluation $OT Eval Low Complexity: 1 Low OT Treatments $Self Care/Home Management : 8-22 mins  Joeseph Amor OTR/L  Acute Rehab Services  [phone] office number [phone] pager number   Joeseph Amor 10/06/2021, 10:19 AM

## 2021-10-06 NOTE — Progress Notes (Signed)
Pt has PRN Bipap orders, no respiratory distress noted at this time.

## 2021-10-06 NOTE — Progress Notes (Signed)
PT Cancellation Note  Patient Details Name: AEVAH STANSBERY MRN: [id number] DOB: 06-24-1930   Cancelled Treatment:    Reason Eval/Treat Not Completed: Fatigue/lethargy limiting ability to participate. Patient states she worked with OT this am, too tired right now. Will re-attempt later today.    Makailah Slavick 10/06/2021, 11:08 AM

## 2021-10-07 ENCOUNTER — Other Ambulatory Visit (HOSPITAL_COMMUNITY): Payer: Self-pay

## 2021-10-07 ENCOUNTER — Telehealth: Payer: Self-pay | Admitting: Physician Assistant

## 2021-10-07 ENCOUNTER — Inpatient Hospital Stay (HOSPITAL_COMMUNITY): Payer: Medicare Other

## 2021-10-07 DIAGNOSIS — I5043 Acute on chronic combined systolic (congestive) and diastolic (congestive) heart failure: Secondary | ICD-10-CM | POA: Diagnosis not present

## 2021-10-07 DIAGNOSIS — E876 Hypokalemia: Secondary | ICD-10-CM

## 2021-10-07 DIAGNOSIS — J9601 Acute respiratory failure with hypoxia: Secondary | ICD-10-CM

## 2021-10-07 LAB — BASIC METABOLIC PANEL
Anion gap: 8 (ref 5–15)
BUN: 34 mg/dL — ABNORMAL HIGH (ref 8–23)
CO2: 26 mmol/L (ref 22–32)
Calcium: 8.8 mg/dL — ABNORMAL LOW (ref 8.9–10.3)
Chloride: 104 mmol/L (ref 98–111)
Creatinine, Ser: 1.36 mg/dL — ABNORMAL HIGH (ref 0.44–1.00)
GFR, Estimated: 37 mL/min — ABNORMAL LOW (ref >60)
Glucose, Bld: 114 mg/dL — ABNORMAL HIGH (ref 70–99)
Potassium: 3.2 mmol/L — ABNORMAL LOW (ref 3.5–5.1)
Sodium: 138 mmol/L (ref 135–145)

## 2021-10-07 LAB — MAGNESIUM: Magnesium: 1.8 mg/dL (ref 1.7–2.4)

## 2021-10-07 LAB — ECHOCARDIOGRAM COMPLETE
AR max vel: 1.84 cm2
AV Area VTI: 1.69 cm2
AV Area mean vel: 1.63 cm2
AV Mean grad: 3 mmHg
AV Peak grad: 5.3 mmHg
Ao pk vel: 1.15 m/s
Area-P 1/2: 3.91 cm2
Height: 62 in
S' Lateral: 2.82 cm
Weight: 2568 oz

## 2021-10-07 LAB — BRAIN NATRIURETIC PEPTIDE: B Natriuretic Peptide: 355 pg/mL — ABNORMAL HIGH (ref 0.0–100.0)

## 2021-10-07 MED ORDER — POTASSIUM CHLORIDE CRYS ER 20 MEQ PO TBCR
40.0000 meq | EXTENDED_RELEASE_TABLET | Freq: Once | ORAL | Status: AC
Start: 2021-10-07 — End: 2021-10-07
  Administered 2021-10-07: 40 meq via ORAL
  Filled 2021-10-07: qty 2

## 2021-10-07 MED ORDER — MAGNESIUM SULFATE 2 GM/50ML IV SOLN
2.0000 g | Freq: Once | INTRAVENOUS | Status: AC
  Administered 2021-10-07: 2 g via INTRAVENOUS
  Filled 2021-10-07: qty 50

## 2021-10-07 MED ORDER — POTASSIUM CHLORIDE CRYS ER 20 MEQ PO TBCR
40.0000 meq | EXTENDED_RELEASE_TABLET | Freq: Once | ORAL | Status: AC
  Administered 2021-10-07: 40 meq via ORAL
  Filled 2021-10-07: qty 2

## 2021-10-07 MED ORDER — HYDRALAZINE HCL 50 MG PO TABS: 50.0000 mg | ORAL_TABLET | Freq: Three times a day (TID) | ORAL | 0 refills | Status: DC

## 2021-10-07 MED ORDER — HYDRALAZINE HCL 50 MG PO TABS
50.0000 mg | ORAL_TABLET | Freq: Three times a day (TID) | ORAL | Status: DC
  Administered 2021-10-07: 50 mg via ORAL
  Filled 2021-10-07 (×2): qty 1

## 2021-10-07 MED ORDER — CARVEDILOL 12.5 MG PO TABS: 12.5000 mg | ORAL_TABLET | Freq: Two times a day (BID) | ORAL | 0 refills | Status: DC

## 2021-10-07 MED ORDER — ISOSORBIDE MONONITRATE ER 30 MG PO TB24: 30.0000 mg | ORAL_TABLET | Freq: Every day | ORAL | 0 refills | Status: DC

## 2021-10-07 MED ORDER — PERFLUTREN LIPID MICROSPHERE
1.0000 mL | INTRAVENOUS | Status: AC | PRN
  Filled 2021-10-07: qty 10

## 2021-10-07 NOTE — TOC Progression Note (Signed)
Transition of Care [facility]) - Progression Note    Patient Details  Name: Stephanie Charles MRN: [id number] Date of Birth: 1930/06/11  [facility]) CM/SW Contact  Zenon Mayo, RN Phone Number: 10/07/2021, 3:25 PM  Clinical Narrative:    NCM spoke with patient, she lives with one of her daughters,, she states she has been taking entresto already.  She has no needs at this time. TOC will continue to follow.         Expected Discharge Plan and Services                                                 Social Determinants of Health (SDOH) Interventions    Readmission Risk Interventions No flowsheet data found.

## 2021-10-07 NOTE — Telephone Encounter (Signed)
Patient daughter calling to schedule close hospital fu visit  for planned dc

## 2021-10-07 NOTE — TOC Transition Note (Signed)
Transition of Care [facility]) - CM/SW Discharge Note   Patient Details  Name: [name] MRN: [id number] Date of Birth: 1930-06-27  [facility]) CM/SW Contact:  Zenon Mayo, RN Phone Number: 10/07/2021, 3:27 PM   Clinical Narrative:    NCM spoke with patient, she lives with one of her daughters,, she states she has been taking entresto already.  She has no needs at this time.    Final next level of care: Home/Self Care Barriers to Discharge: Continued Medical Work up   Patient Goals and CMS Choice        Discharge Placement                       Discharge Plan and Services                                     Social Determinants of Health (SDOH) Interventions     Readmission Risk Interventions No flowsheet data found.

## 2021-10-07 NOTE — Evaluation (Signed)
Physical Therapy Evaluation Patient Details Name: Stephanie Charles MRN: [id number] DOB: 1930-05-31 Today's Date: 10/07/2021  History of Present Illness  Pt is a 88 yr old female who presented 10/05/21 due to uncontrolled HTN and SOB. PMH of CAD with NSTEMI status post stenting 2020, CHF, vertigo, TIA, MI,   Clinical Impression  Pt presents with condition above and deficits mentioned below, see PT Problem List. PTA, she was independent without an AD for mobility, living in a 1-level house with 1 STE. Currently, pt appears not far from her baseline, displaying some mild deficits in balance and activity tolerance. I expect she will progress quickly back to her baseline with continued mobility. Will continue to follow acutely to maximize her return to baseline.       Recommendations for follow up therapy are one component of a multi-disciplinary discharge planning process, led by the attending physician.  Recommendations may be updated based on patient status, additional functional criteria and insurance authorization.  Follow Up Recommendations No PT follow up    Assistance Recommended at Discharge PRN  Patient can return home with the following       Equipment Recommendations None recommended by PT  Recommendations for Other Services       Functional Status Assessment Patient has had a recent decline in their functional status and demonstrates the ability to make significant improvements in function in a reasonable and predictable amount of time.     Precautions / Restrictions Precautions Precautions:  (BP/HR) Restrictions Weight Bearing Restrictions: No      Mobility  Bed Mobility               General bed mobility comments: Pt sitting up EOB upon arrival.    Transfers Overall transfer level: Modified independent Equipment used: None               General transfer comment: Able to come to stand from EOB with extra time and no LOB.     Ambulation/Gait Ambulation/Gait assistance: Supervision Gait Distance (Feet): 325 Feet Assistive device: None Gait Pattern/deviations: Step-through pattern;Narrow base of support;Decreased stride length Gait velocity: reduced Gait velocity interpretation: 1.31 - 2.62 ft/sec, indicative of limited community ambulator   General Gait Details: Pt with fairly steady gait with intermittent narrow placement of feet and mild trunk sway, no LOB, supervision for safety.  Stairs Stairs: Yes Stairs assistance: Min guard Stair Management: One rail Right;Step to pattern;Backwards;Forwards Number of Stairs: 1 General stair comments: Ascends forward with R hand rail and descends backward with R hand rail, no LOB but extra time and effort to ascend, min guard for safety.  Wheelchair Mobility    Modified Rankin (Stroke Patients Only)       Balance Overall balance assessment: Mild deficits observed, not formally tested                                           Pertinent Vitals/Pain Pain Assessment: No/denies pain    Home Living Family/patient expects to be discharged to:: Private residence Living Arrangements: Children Available Help at Discharge: Family Type of Home: House Home Access: Stairs to enter Entrance Stairs-Rails: None Entrance Stairs-Number of Steps: 1   Home Layout: One level Home Equipment: Cane - single point      Prior Function Prior Level of Function : Independent/Modified Independent;Driving  Mobility Comments: reported using no equipment       Hand Dominance        Extremity/Trunk Assessment   Upper Extremity Assessment Upper Extremity Assessment: Defer to OT evaluation    Lower Extremity Assessment Lower Extremity Assessment: Overall WFL for tasks assessed    Cervical / Trunk Assessment Cervical / Trunk Assessment: Kyphotic  Communication   Communication: No difficulties  Cognition Arousal/Alertness:  Awake/alert Behavior During Therapy: WFL for tasks assessed/performed Overall Cognitive Status: Within Functional Limits for tasks assessed                                          General Comments      Exercises     Assessment/Plan    PT Assessment Patient needs continued PT services  PT Problem List Decreased activity tolerance;Decreased balance;Decreased mobility;Cardiopulmonary status limiting activity       PT Treatment Interventions DME instruction;Gait training;Stair training;Therapeutic activities;Functional mobility training;Therapeutic exercise;Balance training;Neuromuscular re-education;Patient/family education    PT Goals (Current goals can be found in the Care Plan section)  Acute Rehab PT Goals Patient Stated Goal: to go to her daughter's memorial service PT Goal Formulation: With patient Time For Goal Achievement: 10/21/21 Potential to Achieve Goals: Good    Frequency Min 3X/week     Co-evaluation               AM-PAC PT "6 Clicks" Mobility  Outcome Measure Help needed turning from your back to your side while in a flat bed without using bedrails?: A Little Help needed moving from lying on your back to sitting on the side of a flat bed without using bedrails?: A Little Help needed moving to and from a bed to a chair (including a wheelchair)?: A Little Help needed standing up from a chair using your arms (e.g., wheelchair or bedside chair)?: A Little Help needed to walk in hospital room?: A Little Help needed climbing 3-5 steps with a railing? : A Little 6 Click Score: 18    End of Session   Activity Tolerance: Patient tolerated treatment well Patient left: in chair;with call bell/phone within reach Nurse Communication: Mobility status PT Visit Diagnosis: Unsteadiness on feet (R26.81);Other abnormalities of gait and mobility (R26.89);Difficulty in walking, not elsewhere classified (R26.2)    Time: 2774-1287 PT Time Calculation  (min) (ACUTE ONLY): 11 min   Charges:   PT Evaluation $PT Eval Low Complexity: 1 Low          Moishe Spice, PT, DPT Acute Rehabilitation Services  Pager: [phone] Office: [phone]   Orvan Falconer 10/07/2021, 11:03 AM

## 2021-10-07 NOTE — Plan of Care (Signed)
°  Problem: Education: Goal: Knowledge of General Education information will improve Description: Including pain rating scale, medication(s)/side effects and non-pharmacologic comfort measures Outcome: Progressing   Problem: Clinical Measurements: Goal: Diagnostic test results will improve Outcome: Progressing Goal: Respiratory complications will improve Outcome: Progressing Goal: Cardiovascular complication will be avoided Outcome: Progressing   Problem: Activity: Goal: Risk for activity intolerance will decrease Outcome: Progressing   Problem: Elimination: Goal: Will not experience complications related to urinary retention Outcome: Progressing   Problem: Pain Managment: Goal: General experience of comfort will improve Outcome: Progressing   Problem: Safety: Goal: Ability to remain free from injury will improve Outcome: Progressing   Problem: Activity: Goal: Capacity to carry out activities will improve Outcome: Progressing   Problem: Cardiac: Goal: Ability to achieve and maintain adequate cardiopulmonary perfusion will improve Outcome: Progressing

## 2021-10-07 NOTE — Progress Notes (Signed)
Occupational Therapy Treatment Patient Details Name: Stephanie Charles MRN: [id number] DOB: 07-08-1930 Today's Date: 10/07/2021   History of present illness Pt is a 88 yr old female who presented 10/05/21 due to uncontrolled HTN and SOB. PMH of CAD with NSTEMI status post stenting 2020, CHF, vertigo, TIA, MI,   OT comments  OT treatment session with focus on verbal and written education on energy conservation techniques in prep for ADLs/IADLs. Patient would benefit from continued acute OT services in prep for ADLs/IADLs at time of d/c. OT will continue to follow acutely.    Recommendations for follow up therapy are one component of a multi-disciplinary discharge planning process, led by the attending physician.  Recommendations may be updated based on patient status, additional functional criteria and insurance authorization.    Follow Up Recommendations  No OT follow up    Assistance Recommended at Discharge Intermittent Supervision/Assistance  Patient can return home with the following      Equipment Recommendations  None recommended by OT    Recommendations for Other Services      Precautions / Restrictions Precautions Precautions:  (BP/HR) Precaution Comments: Monitor BP and HR Restrictions Weight Bearing Restrictions: No       Mobility Bed Mobility               General bed mobility comments: Pt sitting at EOB upon entry.    Transfers Overall transfer level: Modified independent Equipment used: None               General transfer comment: Able to come to stand from EOB with extra time and no LOB.     Balance Overall balance assessment: Mild deficits observed, not formally tested                                         ADL either performed or assessed with clinical judgement   ADL Overall ADL's : Needs assistance/impaired                                       General ADL Comments: Mod I to supervision A overall     Extremity/Trunk Assessment Upper Extremity Assessment Upper Extremity Assessment: Defer to OT evaluation   Lower Extremity Assessment Lower Extremity Assessment: Overall WFL for tasks assessed   Cervical / Trunk Assessment Cervical / Trunk Assessment: Kyphotic    Vision       Perception     Praxis      Cognition Arousal/Alertness: Awake/alert Behavior During Therapy: WFL for tasks assessed/performed Overall Cognitive Status: Within Functional Limits for tasks assessed                                            Exercises     Shoulder Instructions       General Comments      Pertinent Vitals/ Pain       Pain Assessment: No/denies pain  Home Living Family/patient expects to be discharged to:: Private residence Living Arrangements: Children Available Help at Discharge: Family Type of Home: House Home Access: Stairs to enter Technical brewer of Steps: 1 Entrance Stairs-Rails: None Home Layout: One level     Bathroom Shower/Tub: Tub/shower unit;Walk-in shower  Bathroom Accessibility: Yes   Home Equipment: Cane - single point          Prior Functioning/Environment              Frequency  Min 2X/week        Progress Toward Goals  OT Goals(current goals can now be found in the care plan section)  Progress towards OT goals: Progressing toward goals  Acute Rehab OT Goals Patient Stated Goal: To return home. OT Goal Formulation: With patient Time For Goal Achievement: 10/20/21 Potential to Achieve Goals: Good ADL Goals Additional ADL Goal #1: Patient will recall and demo 3 energy conservation techniques in prep for safe d/c home. Additional ADL Goal #2: Patient will tolerate 15 minutes of therapeutic activity without need for rest break indicating improved activity tolerance in prep for safe return home.  Plan Discharge plan remains appropriate;Frequency remains appropriate    Co-evaluation                  AM-PAC OT "6 Clicks" Daily Activity     Outcome Measure   Help from another person eating meals?: None Help from another person taking care of personal grooming?: None Help from another person toileting, which includes using toliet, bedpan, or urinal?: A Little Help from another person bathing (including washing, rinsing, drying)?: A Little Help from another person to put on and taking off regular upper body clothing?: None Help from another person to put on and taking off regular lower body clothing?: A Little 6 Click Score: 21    End of Session    OT Visit Diagnosis: Unsteadiness on feet (R26.81)   Activity Tolerance Patient tolerated treatment well   Patient Left with call bell/phone within reach;Other (comment) (Seated EOB)   Nurse Communication Mobility status        Time: 8841-6606 OT Time Calculation (min): 10 min  Charges: OT General Charges $OT Visit: 1 Visit OT Treatments $Therapeutic Activity: 23-37 mins  Kord Monette H. OTR/L Supplemental OT, Department of rehab services [phone]  Elnora Quizon R H. 10/07/2021, 1:37 PM

## 2021-10-07 NOTE — Discharge Summary (Signed)
Stephanie Charles CHE:[id number] DOB: 1929/12/29 DOA: 10/05/2021  PCP: Jerrol Banana., MD  Admit date: 10/05/2021 Discharge date: 10/07/2021  Admitted From: Home Disposition: Home  Recommendations for Outpatient Follow-up:  Follow up with PCP in 1 week Please obtain BMP/CBC in one week Please follow up cardiology in 1 week     Discharge Condition:Stable CODE STATUS: Partial Diet recommendation: Heart Healthy  Brief/Interim Summary: Per NTI:RWERX O Tidd is a 88 y.o. female with medical history significant of CAD with NSTEMI status post stenting 2020, history of chronic systolic CHF secondary to ischemic cardiomyopathy with recovered LVEF 50-55% on Entresto, CKD stage IIIb, HTN, hypothyroidism, preented with uncontrolled hypertension and increasing shortness of breath.   Acute hypoxic respiratory failure Acute on chronic combined systolic and diastolic heart failure Improved with IV diuretics EF 40% with global hypokinesis Bnp down Will need to follow-up with cardiology as outpatient Bp control, importance of taking meds regularly discussed with patient Of note, pt's daughter died during the time pt was hospitalized and she has been stressed over this..    Uncontrolled hypertension Improved overall Improvement, continue meds. Follow-up with PCP for further management   Elevated troponin No chest pain Mildly elevated Likely due to demand ischemia from acute CHF     Chronic kidney disease stage IIIb At baseline Monitor as outpatient  Hypokalemia Supplemented prior to discharge   Discharge Diagnoses:  Principal Problem:   CHF (congestive heart failure) (HCC) Active Problems:   Acute on chronic combined systolic and diastolic congestive heart failure [facility])    Discharge Instructions  Discharge Instructions     AMB referral to CHF clinic   Complete by: As directed    Call MD for:  difficulty breathing, headache or visual disturbances   Complete by: As  directed    Diet - low sodium heart healthy   Complete by: As directed    Discharge instructions   Complete by: As directed    Need to follow up with your heart doctor within 5 days Weigh daily in am , take torsemide if weight up by 3 lbs   Increase activity slowly   Complete by: As directed       Allergies as of 10/07/2021       Reactions   Latex Itching   Levofloxacin    Mouth sores   Lisinopril    Cough?   Povidone-iodine Other (See Comments)   Severe blistering and itchiness. Severe redness   Simvastatin    Myalgias   Codeine Nausea And Vomiting        Medication List     TAKE these medications    albuterol (2.5 MG/3ML) 0.083% nebulizer solution Commonly known as: PROVENTIL USE 1 VIAL IN NEBULIZER EVERY 4 HOURS AS NEEDED What changed: See the new instructions.   allopurinol 300 MG tablet Commonly known as: ZYLOPRIM Take 1 tablet by mouth once daily   amitriptyline 10 MG tablet Commonly known as: ELAVIL TAKE 1 TABLET BY MOUTH AT BEDTIME   Aspirin Low Dose 81 MG EC tablet Generic drug: aspirin NEW PRESCRIPTION REQUEST: TAKE ONE TABLET BY MOUTH EVERY DAY What changed: See the new instructions.   atorvastatin 40 MG tablet Commonly known as: LIPITOR Take 1 tablet by mouth once daily   carvedilol 12.5 MG tablet Commonly known as: COREG Take 1 tablet (12.5 mg total) by mouth 2 (two) times daily. What changed:  medication strength how much to take   clopidogrel 75 MG tablet Commonly known as: PLAVIX NEW  PRESCRIPTION REQUEST: TAKE ONE TABLET BY MOUTH EVERY DAY What changed: See the new instructions.   Entresto 49-51 MG Generic drug: sacubitril-valsartan Take 1 tablet by mouth 2 (two) times daily.   Ferrous Sulfate 27 MG Tabs Take 27 mg by mouth at bedtime.   hydrALAZINE 50 MG tablet Commonly known as: APRESOLINE Take 1 tablet (50 mg total) by mouth every 8 (eight) hours.   hydrocortisone 2.5 % cream Apply 1 application topically 2 (two) times  daily as needed (for skin irritation).   isosorbide mononitrate 30 MG 24 hr tablet Commonly known as: IMDUR Take 1 tablet (30 mg total) by mouth daily. Start taking on: October 08, 2021   levothyroxine 137 MCG tablet Commonly known as: SYNTHROID NEW PRESCRIPTION REQUEST: TAKE ONE TABLET BY MOUTH EVERY DAY IN THE MORNING What changed: See the new instructions.   Melatonin 10 MG Tabs Take 20 mg by mouth at bedtime.   mirtazapine 30 MG tablet Commonly known as: REMERON NEW PRESCRIPTION REQUEST: TAKE ONE TABLET BY MOUTH AT BEDTIME What changed: See the new instructions.   multivitamin capsule Take 1 capsule by mouth every evening.   pantoprazole 40 MG tablet Commonly known as: PROTONIX NEW PRESCRIPTION REQUEST: TAKE ONE TABLET BY MOUTH EVERY DAY What changed: See the new instructions.   sertraline 25 MG tablet Commonly known as: ZOLOFT NEW PRESCRIPTION REQUEST: TAKE ONE TABLET BY MOUTH EVERY DAY What changed: See the new instructions.   torsemide 20 MG tablet Commonly known as: DEMADEX Take 1 tablet (20 mg total) by mouth daily as needed (As needed for shortness of breath, swelling, or weight gain).        Follow-up Information     [name], MD Follow up in 1 week(s).   Specialty: Cardiology Contact information: [place] [address] [phone]         Jerrol Banana., MD Follow up in 1 week(s).   Specialty: Family Medicine Contact information: [address] [place] [address] [phone]         [place] Follow up in 1 week(s).   Specialty: Cardiology Contact information: [place] [address] [phone]               Allergies  Allergen Reactions   Latex Itching   Levofloxacin     Mouth sores   Lisinopril     Cough?   Povidone-Iodine Other (See Comments)    Severe blistering and  itchiness. Severe redness   Simvastatin     Myalgias   Codeine Nausea And Vomiting    Consultations:    Procedures/Studies: DG Chest 2 View  Result Date: 09/17/2021 CLINICAL DATA:  Chest pain EXAM: CHEST - 2 VIEW COMPARISON:  June 2022 FINDINGS: The heart size and mediastinal contours are within normal limits. Shallow inspiration with very low lung volumes. No pleural effusion or pneumothorax. Bibasilar atelectasis/scarring. Chronic interstitial changes. No acute osseous abnormality. IMPRESSION: Low lung volumes with bibasilar atelectasis/scarring. Electronically Signed   By: Macy Mis M.D.   On: 09/17/2021 14:30   CT Head Wo Contrast  Result Date: 09/10/2021 CLINICAL DATA:  Syncope, confusion. EXAM: CT HEAD WITHOUT CONTRAST TECHNIQUE: Contiguous axial images were obtained from the base of the skull through the vertex without intravenous contrast. COMPARISON:  December 03, 2010. FINDINGS: Brain: No evidence of acute infarction, hemorrhage, hydrocephalus, extra-axial collection or mass lesion/mass effect. Vascular: No hyperdense  vessel or unexpected calcification. Skull: Normal. Negative for fracture or focal lesion. Sinuses/Orbits: No acute finding. Other: None. IMPRESSION: No acute intracranial abnormality seen. Electronically Signed   By: Marijo Conception M.D.   On: 09/10/2021 15:41   US Venous Img Lower Bilateral (DVT)  Result Date: 09/11/2021 CLINICAL DATA:  Lower extremity edema and history of prior DVT. EXAM: BILATERAL LOWER EXTREMITY VENOUS DOPPLER ULTRASOUND TECHNIQUE: Gray-scale sonography with graded compression, as well as color Doppler and duplex ultrasound were performed to evaluate the lower extremity deep venous systems from the level of the common femoral vein and including the common femoral, femoral, profunda femoral, popliteal and calf veins including the posterior tibial, peroneal and gastrocnemius veins when visible. The superficial great saphenous vein was also  interrogated. Spectral Doppler was utilized to evaluate flow at rest and with distal augmentation maneuvers in the common femoral, femoral and popliteal veins. COMPARISON:  Prior study on 11/15/2008 FINDINGS: RIGHT LOWER EXTREMITY Common Femoral Vein: No evidence of thrombus. Normal compressibility, respiratory phasicity and response to augmentation. Saphenofemoral Junction: No evidence of thrombus. Normal compressibility and flow on color Doppler imaging. Profunda Femoral Vein: No evidence of thrombus. Normal compressibility and flow on color Doppler imaging. Femoral Vein: No evidence of thrombus. Normal compressibility, respiratory phasicity and response to augmentation. Popliteal Vein: No evidence of thrombus. Normal compressibility, respiratory phasicity and response to augmentation. Calf Veins: No evidence of thrombus. Normal compressibility and flow on color Doppler imaging. Superficial Great Saphenous Vein: No evidence of thrombus. Normal compressibility. Venous Reflux:  None. Other Findings: No evidence of superficial thrombophlebitis or abnormal fluid collection. LEFT LOWER EXTREMITY Common Femoral Vein: No evidence of thrombus. Normal compressibility, respiratory phasicity and response to augmentation. Saphenofemoral Junction: No evidence of thrombus. Normal compressibility and flow on color Doppler imaging. Profunda Femoral Vein: No evidence of thrombus. Normal compressibility and flow on color Doppler imaging. Femoral Vein: No evidence of thrombus. Normal compressibility, respiratory phasicity and response to augmentation. Popliteal Vein: No evidence of thrombus. Normal compressibility, respiratory phasicity and response to augmentation. Calf Veins: No evidence of thrombus. Normal compressibility and flow on color Doppler imaging. Superficial Great Saphenous Vein: No evidence of thrombus. Normal compressibility. Venous Reflux:  None. Other Findings: No evidence of superficial thrombophlebitis or abnormal  fluid collection. IMPRESSION: No evidence of deep venous thrombosis in either lower extremity. Electronically Signed   By: Aletta Edouard M.D.   On: 09/11/2021 16:53   DG Chest Port 1 View  Result Date: 10/06/2021 CLINICAL DATA:  History of CHF at age 43. EXAM: PORTABLE CHEST 1 VIEW COMPARISON:  Comparison is made with October 05, 2021. FINDINGS: Image rotated slightly to the LEFT. Cardiomediastinal contours mild cardiac enlargement similar to prior imaging, accentuated by portable technique. Mild LEFT lower lobe airspace disease, increased compared to previous imaging. Lung volumes low normal. No sign of lobar consolidation. Areas of nodular opacity of developed in the LEFT mid chest and the RIGHT upper chest since previous imaging just 1 day ago. On limited assessment there is no acute skeletal process. IMPRESSION: 1. Increasing LEFT lower lobe airspace disease. This may reflect worsening atelectasis superimposed on scarring. Developing infection is not excluded. 2. Areas of nodular opacity of developed in the LEFT mid and RIGHT upper chest since previous imaging just 1 day ago. These are adjacent to EKG leads and could reflect confluence of shadows due to structures overlying the chest, consider attention on subsequent radiographs, consider PA and lateral chest radiograph on follow-up to ensure resolution. Electronically Signed  By: Zetta Bills M.D.   On: 10/06/2021 08:09   DG Chest Port 1 View  Result Date: 10/05/2021 CLINICAL DATA:  Shortness of breath possible CHF exacerbation EXAM: PORTABLE CHEST 1 VIEW COMPARISON:  Chest radiograph dated September 17, 2021 FINDINGS: The heart is enlarged. Atherosclerotic calcification of the aortic arch. Pulmonary vascular congestion without focal consolidation or pleural effusion. Low lung volumes. IMPRESSION: 1. Stable cardiomegaly. 2. Pulmonary vascular congestion without focal consolidation, frank pulmonary edema or large pleural effusion. Electronically Signed    By: Keane Police D.O.   On: 10/05/2021 11:38   ECHOCARDIOGRAM COMPLETE  Result Date: 10/07/2021    ECHOCARDIOGRAM REPORT   Patient Name:   Stephanie Charles Date of Exam: 10/07/2021 Medical Rec #:  [id number]      Height:       62.0 in Accession #:    [id number]     Weight:       160.5 lb Date of Birth:  09-30-1930     BSA:          1.741 m Patient Age:    4 years       BP:           145/59 mmHg Patient Gender: F              HR:           53 bpm. Exam Location:  Inpatient Procedure: 2D Echo, Cardiac Doppler, Color Doppler and Intracardiac            Opacification Agent Indications:    CHF  History:        Patient has prior history of Echocardiogram examinations, most                 recent 03/07/2021. CHF, CAD and Previous Myocardial Infarction,                 TIA; Risk Factors:Hypertension.  Sonographer:    Merrie Roof RDCS Referring Phys: [id number] Olivia  1. Frequent PVCs during study.  2. Difficult to determine LVEF due to very frequent ectopy and limited echo windows. Based on images with no ectopy, left ventricular ejection fraction, by estimation, is 40%. The left ventricle has mildly-to-moderately decreased function. The left ventricle demonstrates global hypokinesis. There is paradoxical septal motion due to known LBBB. There is moderate concentric left ventricular hypertrophy. Left ventricular diastolic parameters are indeterminate.  3. Right ventricular systolic function is normal. The right ventricular size is normal.  4. Left atrial size was severely dilated.  5. The mitral valve is grossly normal. Trivial mitral valve regurgitation.  6. The aortic valve is tricuspid. There is mild calcification of the aortic valve. There is mild thickening of the aortic valve. Aortic valve regurgitation is not visualized. Aortic valve sclerosis/calcification is present, without any evidence of aortic stenosis.  7. The inferior vena cava is normal in size with greater than 50% respiratory variability,  suggesting right atrial pressure of 3 mmHg. Comparison(s): Compared to prior echo in 03/2021, the LVEF has decreased to ~40%. This is similar to that seen on TTE in 2020. FINDINGS  Left Ventricle: Left ventricular ejection fraction, by estimation, is 40%. The left ventricle has mild to moderately decreased function. The left ventricle demonstrates global hypokinesis. The left ventricular internal cavity size was normal in size. There is moderate concentric left ventricular hypertrophy. Abnormal (paradoxical) septal motion, consistent with left bundle branch block. Left ventricular diastolic parameters are indeterminate. Right Ventricle: The right  ventricular size is normal. Right vetricular wall thickness was not well visualized. Right ventricular systolic function is normal. Left Atrium: Left atrial size was severely dilated. Right Atrium: Right atrial size was normal in size. Pericardium: There is no evidence of pericardial effusion. Mitral Valve: The mitral valve is grossly normal. There is mild thickening of the mitral valve leaflet(s). There is mild calcification of the mitral valve leaflet(s). Mild mitral annular calcification. Trivial mitral valve regurgitation. Tricuspid Valve: The tricuspid valve is normal in structure. Tricuspid valve regurgitation is trivial. Aortic Valve: The aortic valve is tricuspid. There is mild calcification of the aortic valve. There is mild thickening of the aortic valve. Aortic valve regurgitation is not visualized. Aortic valve sclerosis/calcification is present, without any evidence of aortic stenosis. Aortic valve mean gradient measures 3.0 mmHg. Aortic valve peak gradient measures 5.3 mmHg. Aortic valve area, by VTI measures 1.69 cm. Pulmonic Valve: The pulmonic valve was normal in structure. Pulmonic valve regurgitation is trivial. Aorta: The aortic root and ascending aorta are structurally normal, with no evidence of dilitation. Venous: The inferior vena cava is normal in  size with greater than 50% respiratory variability, suggesting right atrial pressure of 3 mmHg. IAS/Shunts: The atrial septum is grossly normal.  LEFT VENTRICLE PLAX 2D LVIDd:         4.36 cm   Diastology LVIDs:         2.82 cm   LV e' medial:    7.29 cm/s LV PW:         1.18 cm   LV E/e' medial:  8.8 LV IVS:        1.03 cm   LV e' lateral:   7.72 cm/s LVOT diam:     2.00 cm   LV E/e' lateral: 8.3 LV SV:         47 LV SV Index:   27 LVOT Area:     3.14 cm  RIGHT VENTRICLE RV Basal diam:  5.34 cm LEFT ATRIUM              Index LA diam:        5.10 cm  2.93 cm/m LA Vol (A2C):   125.0 ml 71.80 ml/m LA Vol (A4C):   129.0 ml 74.09 ml/m LA Biplane Vol: 133.0 ml 76.39 ml/m  AORTIC VALVE AV Area (Vmax):    1.84 cm AV Area (Vmean):   1.63 cm AV Area (VTI):     1.69 cm AV Vmax:           115.00 cm/s AV Vmean:          85.000 cm/s AV VTI:            0.281 m AV Peak Grad:      5.3 mmHg AV Mean Grad:      3.0 mmHg LVOT Vmax:         67.20 cm/s LVOT Vmean:        44.200 cm/s LVOT VTI:          0.151 m LVOT/AV VTI ratio: 0.54  AORTA Ao Root diam: 3.00 cm Ao Asc diam:  3.20 cm MITRAL VALVE MV Area (PHT): 3.91 cm    SHUNTS MV Decel Time: 194 msec    Systemic VTI:  0.15 m MV E velocity: 64.00 cm/s  Systemic Diam: 2.00 cm MV A velocity: 59.70 cm/s MV E/A ratio:  1.07 Gwyndolyn Kaufman MD Electronically signed by Gwyndolyn Kaufman MD Signature Date/Time: 10/07/2021/2:42:46 PM    Final  Subjective: Feels better.  No shortness of breath or dyspnea on exertion.  Stated while she was walking she was doing better  Discharge Exam: Vitals:   10/07/21 0827 10/07/21 1040  BP: (!) 156/67 (!) 145/63  Pulse: 60 62  Resp: 20 (!) 22  Temp:    SpO2: 92% 94%   Vitals:   10/06/21 2014 10/07/21 0331 10/07/21 0827 10/07/21 1040  BP: (!) 151/61 (!) 167/74 (!) 156/67 (!) 145/63  Pulse: 62 74 60 62  Resp: 18 18 20  (!) 22  Temp: 98 F (36.7 C) 97.9 F (36.6 C)    TempSrc: Oral Oral    SpO2: 93% 94% 92% 94%  Weight:  72.8  kg    Height:        General: Pt is alert, awake, not in acute distress Cardiovascular: RRR, S1/S2 +, no rubs, no gallops Respiratory: CTA bilaterally, no wheezing, no rhonchi Abdominal: Soft, NT, ND, bowel sounds + Extremities: no edema, no cyanosis    The results of significant diagnostics from this hospitalization (including imaging, microbiology, ancillary and laboratory) are listed below for reference.     Microbiology: Recent Results (from the past 240 hour(s))  Resp Panel by RT-PCR (Flu A&B, Covid) Nasopharyngeal Swab     Status: None   Collection Time: 10/05/21 11:24 AM   Specimen: Nasopharyngeal Swab; Nasopharyngeal(NP) swabs in vial transport medium  Result Value Ref Range Status   SARS Coronavirus 2 by RT PCR NEGATIVE NEGATIVE Final    Comment: (NOTE) SARS-CoV-2 target nucleic acids are NOT DETECTED.  The SARS-CoV-2 RNA is generally detectable in upper respiratory specimens during the acute phase of infection. The lowest concentration of SARS-CoV-2 viral copies this assay can detect is 138 copies/mL. A negative result does not preclude SARS-Cov-2 infection and should not be used as the sole basis for treatment or other patient management decisions. A negative result may occur with  improper specimen collection/handling, submission of specimen other than nasopharyngeal swab, presence of viral mutation(s) within the areas targeted by this assay, and inadequate number of viral copies(<138 copies/mL). A negative result must be combined with clinical observations, patient history, and epidemiological information. The expected result is Negative.  Fact Sheet for Patients:  EntrepreneurPulse.com.au  Fact Sheet for Healthcare Providers:  IncredibleEmployment.be  This test is no t yet approved or cleared by the [place] FDA and  has been authorized for detection and/or diagnosis of SARS-CoV-2 by FDA under an Emergency Use  Authorization (EUA). This EUA will remain  in effect (meaning this test can be used) for the duration of the COVID-19 declaration under Section 564(b)(1) of the Act, 21 U.S.C.section 360bbb-3(b)(1), unless the authorization is terminated  or revoked sooner.       Influenza A by PCR NEGATIVE NEGATIVE Final   Influenza B by PCR NEGATIVE NEGATIVE Final    Comment: (NOTE) The Xpert Xpress SARS-CoV-2/FLU/RSV plus assay is intended as an aid in the diagnosis of influenza from Nasopharyngeal swab specimens and should not be used as a sole basis for treatment. Nasal washings and aspirates are unacceptable for Xpert Xpress SARS-CoV-2/FLU/RSV testing.  Fact Sheet for Patients: EntrepreneurPulse.com.au  Fact Sheet for Healthcare Providers: IncredibleEmployment.be  This test is not yet approved or cleared by the [place] FDA and has been authorized for detection and/or diagnosis of SARS-CoV-2 by FDA under an Emergency Use Authorization (EUA). This EUA will remain in effect (meaning this test can be used) for the duration of the COVID-19 declaration under Section 564(b)(1) of the  Act, 21 U.S.C. section 360bbb-3(b)(1), unless the authorization is terminated or revoked.  Performed at [facility], [place] [address], [place], [place] [address]      Labs: BNP (last 3 results) Recent Labs    03/06/21 1033 10/05/21 1049 10/07/21 0141  BNP 960.1* 859.6* 814.4*   Basic Metabolic Panel: Recent Labs  Lab 10/05/21 1049 10/06/21 0436 10/07/21 0141  NA 139 137 138  K 4.0 3.5 3.2*  CL 105 103 104  CO2 24 25 26   GLUCOSE 122* 152* 114*  BUN 16 19 34*  CREATININE 1.30* 1.28* 1.36*  CALCIUM 8.9 8.6* 8.8*  MG 1.9  --  1.8   Liver Function Tests: Recent Labs  Lab 10/05/21 1049  AST 20  ALT 16  ALKPHOS 76  BILITOT 0.9  PROT 5.6*  ALBUMIN 3.5   No results for input(s): LIPASE, AMYLASE in the last 168 hours. No results for input(s):  AMMONIA in the last 168 hours. CBC: Recent Labs  Lab 10/05/21 1049  WBC 7.9  NEUTROABS 6.3  HGB 12.0  HCT 36.9  MCV 96.6  PLT 122*   Cardiac Enzymes: No results for input(s): CKTOTAL, CKMB, CKMBINDEX, TROPONINI in the last 168 hours. BNP: Invalid input(s): POCBNP CBG: No results for input(s): GLUCAP in the last 168 hours. D-Dimer No results for input(s): DDIMER in the last 72 hours. Hgb A1c No results for input(s): HGBA1C in the last 72 hours. Lipid Profile No results for input(s): CHOL, HDL, LDLCALC, TRIG, CHOLHDL, LDLDIRECT in the last 72 hours. Thyroid function studies Recent Labs    10/05/21 1529  TSH 4.121   Anemia work up No results for input(s): VITAMINB12, FOLATE, FERRITIN, TIBC, IRON, RETICCTPCT in the last 72 hours. Urinalysis    Component Value Date/Time   COLORURINE STRAW (A) 09/10/2021 0635   APPEARANCEUR CLEAR 09/10/2021 0635   APPEARANCEUR Clear 07/11/2018 1326   LABSPEC 1.015 09/10/2021 0635   LABSPEC 1.011 01/22/2013 1416   PHURINE 6.0 09/10/2021 0635   GLUCOSEU NEGATIVE 09/10/2021 0635   GLUCOSEU Negative 01/22/2013 1416   HGBUR NEGATIVE 09/10/2021 0635   BILIRUBINUR NEGATIVE 09/10/2021 0635   BILIRUBINUR Negative 07/11/2018 1326   BILIRUBINUR Negative 01/22/2013 1416   KETONESUR NEGATIVE 09/10/2021 0635   PROTEINUR NEGATIVE 09/10/2021 0635   UROBILINOGEN 0.2 03/22/2018 1053   NITRITE NEGATIVE 09/10/2021 0635   LEUKOCYTESUR SMALL (A) 09/10/2021 0635   LEUKOCYTESUR 3+ 01/22/2013 1416   Sepsis Labs Invalid input(s): PROCALCITONIN,  WBC,  LACTICIDVEN Microbiology Recent Results (from the past 240 hour(s))  Resp Panel by RT-PCR (Flu A&B, Covid) Nasopharyngeal Swab     Status: None   Collection Time: 10/05/21 11:24 AM   Specimen: Nasopharyngeal Swab; Nasopharyngeal(NP) swabs in vial transport medium  Result Value Ref Range Status   SARS Coronavirus 2 by RT PCR NEGATIVE NEGATIVE Final    Comment: (NOTE) SARS-CoV-2 target nucleic acids are  NOT DETECTED.  The SARS-CoV-2 RNA is generally detectable in upper respiratory specimens during the acute phase of infection. The lowest concentration of SARS-CoV-2 viral copies this assay can detect is 138 copies/mL. A negative result does not preclude SARS-Cov-2 infection and should not be used as the sole basis for treatment or other patient management decisions. A negative result may occur with  improper specimen collection/handling, submission of specimen other than nasopharyngeal swab, presence of viral mutation(s) within the areas targeted by this assay, and inadequate number of viral copies(<138 copies/mL). A negative result must be combined with clinical observations, patient history, and epidemiological  information. The expected result is Negative.  Fact Sheet for Patients:  EntrepreneurPulse.com.au  Fact Sheet for Healthcare Providers:  IncredibleEmployment.be  This test is no t yet approved or cleared by the [place] FDA and  has been authorized for detection and/or diagnosis of SARS-CoV-2 by FDA under an Emergency Use Authorization (EUA). This EUA will remain  in effect (meaning this test can be used) for the duration of the COVID-19 declaration under Section 564(b)(1) of the Act, 21 U.S.C.section 360bbb-3(b)(1), unless the authorization is terminated  or revoked sooner.       Influenza A by PCR NEGATIVE NEGATIVE Final   Influenza B by PCR NEGATIVE NEGATIVE Final    Comment: (NOTE) The Xpert Xpress SARS-CoV-2/FLU/RSV plus assay is intended as an aid in the diagnosis of influenza from Nasopharyngeal swab specimens and should not be used as a sole basis for treatment. Nasal washings and aspirates are unacceptable for Xpert Xpress SARS-CoV-2/FLU/RSV testing.  Fact Sheet for Patients: EntrepreneurPulse.com.au  Fact Sheet for Healthcare Providers: IncredibleEmployment.be  This test is not  yet approved or cleared by the [place] FDA and has been authorized for detection and/or diagnosis of SARS-CoV-2 by FDA under an Emergency Use Authorization (EUA). This EUA will remain in effect (meaning this test can be used) for the duration of the COVID-19 declaration under Section 564(b)(1) of the Act, 21 U.S.C. section 360bbb-3(b)(1), unless the authorization is terminated or revoked.  Performed at [facility], [place] [address], [place], [place] [address]      Time coordinating discharge: Over 30 minutes  SIGNED:   Nolberto Hanlon, MD  [facility] 10/07/2021, 4:34 PM Pager   If 7PM-7AM, please contact night-coverage [email or web address] Password [email or web address]

## 2021-10-07 NOTE — TOC Benefit Eligibility Note (Signed)
Patient Teacher, English as a foreign language completed.    The patient is currently admitted and upon discharge could be taking Entresto 24-26 mg.  The current 30 day co-pay is, $10.35.   The patient is insured through [place], [place] Patient Advocate Specialist [place] Patient Advocate Team Direct Number: [phone]  Fax: [phone]

## 2021-10-07 NOTE — Progress Notes (Signed)
Heart Failure Nurse Navigator Progress Note  Assessed for HV TOC readiness. Pt declined HV TOC clinic appt upon DC from hospitalization. Pt educated on benefits of HV TOC. Pt education complete regarding HF patient booklet. Pt lives in [place] and followed with Dr. Rockey Situ in [place]. Thinks admission is related to holding of lasix per cardiology prior to admission d/t renal function.   Pt tearfully states her daughter passed away yesterday at 81 years old from brain cancer, lives [place], [place]. Service is set for 1/24, pt plans to attend with family driving her there. Gave emotional support. Pt hopeful for discharge today to be with family. Lives with daughter, granddaughter and Isidor Holts.   Pricilla Holm, MSN, RN Heart Failure Nurse Navigator [phone]

## 2021-10-07 NOTE — Progress Notes (Signed)
°  Echocardiogram 2D Echocardiogram has been performed.  Merrie Roof F 10/07/2021, 12:38 PM

## 2021-10-07 NOTE — Progress Notes (Signed)
PT Cancellation Note  Patient Details Name: Stephanie Charles MRN: [id number] DOB: 1930/07/18   Cancelled Treatment:    Reason Eval/Treat Not Completed: Patient declined, no reason specified. Attempted 2x already this morning, with pt politely initially requesting PT to return shortly then requesting PT return later due to not sleeping much last night. Will plan to follow-up later this morning as time permits.  Moishe Spice, PT, DPT Acute Rehabilitation Services  Pager: [phone] Office: [place] 10/07/2021, 8:48 AM

## 2021-10-07 NOTE — Progress Notes (Addendum)
TRH night cross cover note:  I was notified by RN of patient's morning potassium level 3.2 in the context of intermittent PVCs observed on telemetry.  Consequently, I have placed order for potassium chloride 40 mEq p.o. x1 dose now.  I have also placed an order to add on a serum magnesium level.  Continue to monitor on telemetry.   Update: serum Mag level 1.8. Will provide corresponding supplementation via magnesium sulfate 2 g IV over 2 hours x 1 dose now.   Babs Bertin, DO Hospitalist

## 2021-10-07 NOTE — Progress Notes (Signed)
Mobility Specialist Progress Note:   10/07/21 1400  Mobility  Activity Dangled on edge of bed;Ambulated in room  Level of Assistance Standby assist, set-up cues, supervision of patient - no hands on  Assistive Device None  Distance Ambulated (ft) 4 ft  Mobility Ambulated with assistance in room  Mobility Response Tolerated well  Mobility performed by Mobility specialist  $Mobility charge 1 Mobility   No complaints of pain. Encouraged to ambulate but wanted to get back to bed. Left in bed with call bell in reach and all needs met.  [facility] Public librarian Phone [phone] Secondary Phone [phone]

## 2021-10-07 NOTE — Consult Note (Signed)
° °  [facility] CM Inpatient Consult   10/07/2021  Stephanie Charles May 12, 1930 [id number]  [place] Organization [ACO] Patient: [employer] Medicare  Primary Care Provider:  Jerrol Banana., MD, [facility], Embedded provider for Chronic Care Management and had a recent attempt to participate in CCM with CCM Pharmacist  Patient screened for less than 30 days readmission hospitalization with noted for unplanned readmission risk and to assess for potential Embedded Chronic Care Management service needs for post hospital transition.   Met with the patient at the bedside and introduced self and reason for rounding. Explained to patient regarding chronic care follow up. Patient endorses PCP and she has had a lot going on.  Patient agrees to follow up at the end of the month. A reminder card and 24 hour nurse advise line magnet given. Encourage her to take this information with her when she travels out of state.  Plan:  Continue to follow progress and disposition to assess for post hospital care management needs.  Plan to assign to Embedded Chronic Care Management team closer to transition date.  For questions contact:   Natividad Brood, RN BSN [facility] Liaison  [phone] business mobile phone Toll free office [phone]  Fax number: [phone] [email or web address]

## 2021-10-07 NOTE — Progress Notes (Signed)
Discharge paper work given daughter here to transport home will pick up meds in [place] tomorrow.

## 2021-10-08 ENCOUNTER — Telehealth: Payer: Self-pay

## 2021-10-08 NOTE — Telephone Encounter (Addendum)
Transition Care Management Unsuccessful Follow-up Telephone Call  Date of discharge and from where:  [place] 10-07-21 Dx: CHF  Attempts:  1st Attempt  Reason for unsuccessful TCM follow-up call:  Left voice message   Transition Care Management Follow-up Telephone Call Date of discharge and from where: [place] 10-07-21 Dx: CHF How have you been since you were released from the hospital? Feeling better just weak  Any questions or concerns? No  Items Reviewed: Did the pt receive and understand the discharge instructions provided? Yes  Medications obtained and verified? Yes  Other? No  Any new allergies since your discharge? no Dietary orders reviewed? Yes Do you have support at home? Yes   Home Care and Equipment/Supplies: Were home health services ordered? no If so, what is the name of the agency? Na   Has the agency set up a time to come to the patient's home? not applicable Were any new equipment or medical supplies ordered?  No What is the name of the medical supply agency? na Were you able to get the supplies/equipment? not applicable Do you have any questions related to the use of the equipment or supplies? No  Functional Questionnaire: (I = Independent and D = Dependent) ADLs: I  Bathing/Dressing- I  Meal Prep- I  Eating- I  Maintaining continence- I  Transferring/Ambulation- I  Managing Meds- I  Follow up appointments reviewed:  PCP Hospital f/u appt confirmed? Yes  Scheduled to see Dr Ky Barban on 10-26-21 @ [facility] f/u appt confirmed? No  . Are transportation arrangements needed? No  If their condition worsens, is the pt aware to call PCP or go to the Emergency Dept.? Yes Was the patient provided with contact information for the PCP's office or ED? Yes Was to pt encouraged to call back with questions or concerns? Yes   Date of discharge and from where:  [place] 10-07-21 Dx: CHF  Attempts:  2nd Attempt  Reason for  unsuccessful TCM follow-up call:  Left voice message

## 2021-10-08 NOTE — Telephone Encounter (Incomplete Revision)
Transition Care Management Unsuccessful Follow-up Telephone Call  Date of discharge and from where:  [facility] 10-07-21 Dx: CHF  Attempts:  1st Attempt  Reason for unsuccessful TCM follow-up call:  Left voice message  Transition Care Management Unsuccessful Follow-up Telephone Call  Date of discharge and from where:  [facility] 10-07-21 Dx: CHF  Attempts:  2nd Attempt  Reason for unsuccessful TCM follow-up call:  Left voice message

## 2021-10-08 NOTE — Telephone Encounter (Signed)
Called and spoke with patients daughter Neoma Laming. She confirmed that they will be here for the appointment tomorrow at 3:20 pm.

## 2021-10-08 NOTE — Telephone Encounter (Signed)
Called and left a message for patients daughter to call back. I have scheduled the patient in Dr. Gwenyth Ober DOD slot for Friday 10/09/21 at 3:20 per hospital discharge AVS (patient to follow up within 5 days of dscharge.

## 2021-10-09 ENCOUNTER — Ambulatory Visit (INDEPENDENT_AMBULATORY_CARE_PROVIDER_SITE_OTHER): Payer: Medicare Other | Admitting: Cardiovascular Disease

## 2021-10-09 ENCOUNTER — Other Ambulatory Visit: Payer: Self-pay

## 2021-10-09 ENCOUNTER — Encounter: Payer: Self-pay | Admitting: Cardiovascular Disease

## 2021-10-09 VITALS — BP 130/60 | HR 60 | Ht 62.5 in | Wt 164.1 lb

## 2021-10-09 DIAGNOSIS — I5043 Acute on chronic combined systolic (congestive) and diastolic (congestive) heart failure: Secondary | ICD-10-CM | POA: Diagnosis not present

## 2021-10-09 DIAGNOSIS — Z8673 Personal history of transient ischemic attack (TIA), and cerebral infarction without residual deficits: Secondary | ICD-10-CM

## 2021-10-09 MED ORDER — CLOPIDOGREL BISULFATE 75 MG PO TABS: 75.0000 mg | ORAL_TABLET | Freq: Every day | ORAL | 3 refills | Status: DC

## 2021-10-09 MED ORDER — POTASSIUM CHLORIDE ER 10 MEQ PO TBCR: EXTENDED_RELEASE_TABLET | ORAL | 3 refills | Status: DC

## 2021-10-09 MED ORDER — ISOSORBIDE MONONITRATE ER 30 MG PO TB24: 30.0000 mg | ORAL_TABLET | Freq: Every day | ORAL | 3 refills | Status: DC

## 2021-10-09 MED ORDER — CARVEDILOL 12.5 MG PO TABS: 12.5000 mg | ORAL_TABLET | Freq: Two times a day (BID) | ORAL | 3 refills | Status: DC

## 2021-10-09 MED ORDER — HYDRALAZINE HCL 50 MG PO TABS: ORAL_TABLET | ORAL | 3 refills | Status: DC

## 2021-10-09 MED ORDER — ATORVASTATIN CALCIUM 40 MG PO TABS: 40.0000 mg | ORAL_TABLET | Freq: Every day | ORAL | 3 refills | Status: DC

## 2021-10-09 MED ORDER — TORSEMIDE 20 MG PO TABS: 20.0000 mg | ORAL_TABLET | Freq: Every day | ORAL | 3 refills | Status: DC | PRN

## 2021-10-09 NOTE — Patient Instructions (Addendum)
Medication Instructions:  Take potassium 20 meq when you take torsemide as needed  For high blood pressure, >150 Take extra hydralazine or extra isosorbide  For shortness of breath, Take extra torsemide  For weight 163 or high, Take a torsemide with potassium  If you need a refill on your cardiac medications before your next appointment, please call your pharmacy.   Lab work: No new labs needed  Testing/Procedures: No new testing needed  Follow-Up: At [facility], you and your health needs are our priority.  As part of our continuing mission to provide you with exceptional heart care, we have created designated Provider Care Teams.  These Care Teams include your primary Cardiologist (physician) and Advanced Practice Providers (APPs -  Physician Assistants and Nurse Practitioners) who all work together to provide you with the care you need, when you need it.  You will need a follow up appointment in 6 months  Providers on your designated Care Team:   Murray Hodgkins, NP Christell Faith, PA-C Cadence Kathlen Mody, [place]  COVID-19 Vaccine Information can be found at: [email or web address].uk For questions related to vaccine distribution or appointments, please email [email or web address] or call [phone].

## 2021-10-09 NOTE — Progress Notes (Signed)
Cardiology Office Note  Date:  10/09/2021   ID:  Stephanie Charles, DOB March 11, 1930, MRN [id number]  PCP:  Jerrol Banana., MD   Chief Complaint  Patient presents with   [facility]; CHF. Patient c/o shortness of breath & LE edema. Medications reviewed by the patient's medication list.     HPI:  Stephanie Charles is a 88 y.o. female with history of  demand ischemia with hypertensive urgency,  LBBB,  TIA/CVA in 2016,  CKD stage II,  HTN,  HLD,  carotid artery disease,  hypothyroidism,  anxiety, depression,  Coronary disease, stent placed to proximal to mid LAD  ejection fraction 35 to 40%, repeat 09/2019: 40 to 45% ECHO: January 2023 ejection fraction 40% Who presents for follow-up of her ischemic cardiomyopathy  Recent admission to the hospital several days ago, shortness of breath, uncontrolled hypertension/pulmonary edema BNP [facility] records reviewed  "Feel weak", 2 months, slow decline since COVID  Echocardiogram reviewed with her on today's visit  1. Frequent PVCs during study.  Left ventricular ejection  fraction, by estimation, is 40%.  paradoxical septal  motion due to known LBBB. There is moderate concentric left ventricular  hypertrophy.   3. Right ventricular systolic function is normal. The right ventricular  size is normal.   4. Left atrial size was severely dilated.   Comparison(s): Ejection fraction similar to that seen on TTE in 2020.   Since leaving the hospital, Weight at home 159-162 Takes torsemide PRN Reports that she drinks a lot of water for unclear reasons  EKG personally reviewed by myself on todays visit Normal sinus rhythm rate 60 bpm left bundle branch block  Other past medical history reviewed Covid, in hospital,March 06 2021 BP elevated on arrival to the hospital Creatinine 0.9 on arrival,  Treated with diuretic for shortness of breath, concern for CHF Sent home 03/10/2021,d/c with CR 1.6, BUN 92, above her  baseline  After discharge, had episode of syncope on toilet in the setting of diarrhea on 03/11/21 Presented to the hospital again with dehydration multiple episodes of profuse watery diarrhea bradycardic into the 40s, given 0.5 mg of atropine in the ER Creatinine 2.12, BUN 100 Given IV fluids  hospital September 2020, ejection fraction 35 to 40% She had symptoms of chest pain and accelerated HTN found to have elevated high sensitivity troponin peaking at 762 and acute systolic CHF.    cardiac catheterization, with severe proximal to mid LAD disease, stent placed  Follow-up echocardiogram December 2020 ejection fraction 40 up to 45% Tolerating Entresto, carvedilol, HCTZ Reports blood pressure stable at home  PMH:   has a past medical history of Anemia, CHF (congestive heart failure) ([place]) (10/05/2021), Diverticulitis, GERD (gastroesophageal reflux disease), Hyperlipidemia, Hypertension, Hypothyroidism, Myocardial infarction ([place]) (08/2017), TIA (transient ischemic attack), Vertigo, and [name].  PSH:    Past Surgical History:  Procedure Laterality Date   COLONOSCOPY WITH PROPOFOL N/A 06/22/2018   Procedure: COLONOSCOPY WITH PROPOFOL;  Surgeon: Jonathon Bellows, MD;  Location: [facility] ENDOSCOPY;  Service: Endoscopy;  Laterality: N/A;   CORONARY STENT INTERVENTION N/A 06/07/2019   Procedure: CORONARY STENT INTERVENTION;  Surgeon: [name], MD;  Location: [facility] CV LAB;  Service: Cardiovascular;  Laterality: N/A;   ESOPHAGOGASTRODUODENOSCOPY (EGD) WITH PROPOFOL N/A 06/22/2018   Procedure: ESOPHAGOGASTRODUODENOSCOPY (EGD) WITH PROPOFOL;  Surgeon: Jonathon Bellows, MD;  Location: [facility] ENDOSCOPY;  Service: Endoscopy;  Laterality: N/A;   KNEE ARTHROSCOPY Right  REPLACEMENT TOTAL KNEE BILATERAL     RIGHT/LEFT HEART CATH AND CORONARY ANGIOGRAPHY N/A 06/07/2019   Procedure: RIGHT/LEFT HEART CATH AND CORONARY ANGIOGRAPHY;  Surgeon: [name], MD;  Location: [facility]  CV LAB;  Service: Cardiovascular;  Laterality: N/A;   SHOULDER SURGERY Left     Current Outpatient Medications  Medication Sig Dispense Refill   albuterol (PROVENTIL) (2.5 MG/3ML) 0.083% nebulizer solution USE 1 VIAL IN NEBULIZER EVERY 4 HOURS AS NEEDED (Patient taking differently: Take 2.5 mg by nebulization every 4 (four) hours as needed for wheezing or shortness of breath.) 75 mL 11   allopurinol (ZYLOPRIM) 300 MG tablet Take 1 tablet by mouth once daily (Patient taking differently: Take 300 mg by mouth daily.) 90 tablet 0   amitriptyline (ELAVIL) 10 MG tablet TAKE 1 TABLET BY MOUTH AT BEDTIME (Patient taking differently: Take 10 mg by mouth at bedtime.) 90 tablet 0   ASPIRIN LOW DOSE 81 MG EC tablet NEW PRESCRIPTION REQUEST: TAKE ONE TABLET BY MOUTH EVERY DAY (Patient taking differently: Take 81 mg by mouth daily.) 90 tablet 3   atorvastatin (LIPITOR) 40 MG tablet Take 1 tablet by mouth once daily (Patient taking differently: Take 40 mg by mouth daily.) 90 tablet 0   carvedilol (COREG) 12.5 MG tablet Take 1 tablet (12.5 mg total) by mouth 2 (two) times daily. 60 tablet 0   clopidogrel (PLAVIX) 75 MG tablet NEW PRESCRIPTION REQUEST: TAKE ONE TABLET BY MOUTH EVERY DAY (Patient taking differently: Take 75 mg by mouth daily.) 90 tablet 1   Ferrous Sulfate 27 MG TABS Take 27 mg by mouth at bedtime.     hydrALAZINE (APRESOLINE) 50 MG tablet Take 1 tablet (50 mg total) by mouth every 8 (eight) hours. 90 tablet 0   hydrocortisone 2.5 % cream Apply 1 application topically 2 (two) times daily as needed (for skin irritation).     isosorbide mononitrate (IMDUR) 30 MG 24 hr tablet Take 1 tablet (30 mg total) by mouth daily. 30 tablet 0   levothyroxine (SYNTHROID) 137 MCG tablet NEW PRESCRIPTION REQUEST: TAKE ONE TABLET BY MOUTH EVERY DAY IN THE MORNING (Patient taking differently: Take 137 mcg by mouth daily before breakfast.) 90 tablet 3   Melatonin 10 MG TABS Take 20 mg by mouth at bedtime.      mirtazapine (REMERON) 30 MG tablet NEW PRESCRIPTION REQUEST: TAKE ONE TABLET BY MOUTH AT BEDTIME (Patient taking differently: Take 30 mg by mouth at bedtime.) 90 tablet 1   Multiple Vitamin (MULTIVITAMIN) capsule Take 1 capsule by mouth every evening.     pantoprazole (PROTONIX) 40 MG tablet NEW PRESCRIPTION REQUEST: TAKE ONE TABLET BY MOUTH EVERY DAY (Patient taking differently: Take 40 mg by mouth daily.) 90 tablet 1   sacubitril-valsartan (ENTRESTO) 49-51 MG Take 1 tablet by mouth 2 (two) times daily. 180 tablet 3   sertraline (ZOLOFT) 25 MG tablet NEW PRESCRIPTION REQUEST: TAKE ONE TABLET BY MOUTH EVERY DAY (Patient taking differently: Take 25 mg by mouth daily.) 90 tablet 1   torsemide (DEMADEX) 20 MG tablet Take 1 tablet (20 mg total) by mouth daily as needed (As needed for shortness of breath, swelling, or weight gain). 60 tablet 3   No current facility-administered medications for this visit.    Allergies:   Latex, Levofloxacin, Lisinopril, Povidone-iodine, Simvastatin, and Codeine   Social History:  The patient  reports that she has never smoked. She has never used smokeless tobacco. She reports that she does not drink alcohol and does not  use drugs.   Family History:   family history includes Alzheimer's disease in her brother and brother; Breast cancer in her sister; Cancer in her brother; Clotting disorder in her daughter; Fibromyalgia in her daughter; Heart attack in her brother; Heart disease in her brother, brother, father, and mother; Hypertension in her mother; Mental illness in her brother; Stroke in her brother and mother.    Review of Systems: Review of Systems  Constitutional: Negative.   HENT: Negative.    Respiratory: Negative.    Cardiovascular: Negative.   Gastrointestinal: Negative.   Musculoskeletal: Negative.   Neurological:  Positive for tremors.  Psychiatric/Behavioral: Negative.    All other systems reviewed and are negative.  PHYSICAL EXAM: VS:  BP 130/60  (BP Location: Left Arm, Patient Position: Sitting, Cuff Size: Normal)    Pulse 60    Ht 5' 2.5" (1.588 m)    Wt 164 lb 2 oz (74.4 kg)    SpO2 98%    BMI 29.54 kg/m  , BMI Body mass index is 29.54 kg/m. Constitutional:  oriented to person, place, and time. No distress.  HENT:  Head: Grossly normal Eyes:  no discharge. No scleral icterus.  Neck: No JVD, no carotid bruits  Cardiovascular: Regular rate and rhythm, no murmurs appreciated Pulmonary/Chest: Clear to auscultation bilaterally, no wheezes or rails Abdominal: Soft.  no distension.  no tenderness.  Musculoskeletal: Normal range of motion Neurological:  normal muscle tone. Coordination normal. No atrophy Skin: Skin warm and dry Psychiatric: normal affect, pleasant  Recent Labs: 10/05/2021: ALT 16; Hemoglobin 12.0; Platelets 122; TSH 4.121 10/07/2021: B Natriuretic Peptide 355.0; BUN 34; Creatinine, Ser 1.36; Magnesium 1.8; Potassium 3.2; Sodium 138   Lipid Panel Lab Results  Component Value Date   CHOL 118 03/07/2021   HDL 33 (L) 03/07/2021   LDLCALC 68 03/07/2021   TRIG 83 03/07/2021      Wt Readings from Last 3 Encounters:  10/09/21 164 lb 2 oz (74.4 kg)  10/07/21 160 lb 8 oz (72.8 kg)  09/29/21 165 lb (74.8 kg)     ASSESSMENT AND PLAN:  Problem List Items Addressed This Visit       Cardiology Problems   CHF (congestive heart failure) ([place]) - Primary  Ischemic cardiomyopathy Tolerating Entresto, carvedilol, Imdur, now on hydralazine after admission to hospital with high blood pressure, pulmonary edema We have corrected her on her high fluid intake, recommend she moderate her fluids Would also take torsemide 20 mg as needed for weight 163 pounds or higher with potassium 20 as needed  CAD with stable angina Stent placed to her proximal to mid LAD Ejection fraction 40 to 45%, unchanged from 2020 Denies angina Will hold off on ischemic work-up at this time  Carotid bruit Mild disease bilaterally, less than  39%  Hyperlipidemia On Lipitor 40 daily Cholesterol is at goal on the current lipid regimen. No changes to the medications were made.  Chronic renal sufficiency stage III Creatinine stable 1.3   Total encounter time more than 35 minutes  Greater than 50% was spent in counseling and coordination of care with the patient    Signed, Esmond Plants, M.D., Ph.D. [place], [place]

## 2021-10-12 ENCOUNTER — Other Ambulatory Visit: Payer: Self-pay | Admitting: Family Medicine

## 2021-10-12 DIAGNOSIS — F3289 Other specified depressive episodes: Secondary | ICD-10-CM

## 2021-10-12 DIAGNOSIS — K219 Gastro-esophageal reflux disease without esophagitis: Secondary | ICD-10-CM

## 2021-10-12 NOTE — Telephone Encounter (Signed)
LOV: 09/09/21 Next office visit: 10/26/21

## 2021-10-15 ENCOUNTER — Ambulatory Visit: Payer: Medicare Other | Admitting: Family Medicine

## 2021-10-19 ENCOUNTER — Telehealth: Payer: Self-pay | Admitting: *Deleted

## 2021-10-19 NOTE — Telephone Encounter (Signed)
-----   Message from [name], PA-C sent at 10/19/2021  8:46 AM EST ----- Outpatient cardiac monitoring demonstrated a predominant rhythm of sinus with an average rate of 70 bpm, (range 41 to 185 bpm), 1 run of NSVT noted lasting 8 beats, 2 episodes of SVT with the fastest and longest interval lasting 9 beats, occasional PACs, rare atrial couplets, atrial triplets, occasional PVCs, rare ventricular couplets, and ventricular triplets were noted.

## 2021-10-19 NOTE — Telephone Encounter (Signed)
Left voicemail message to call back for review of results.  

## 2021-10-21 ENCOUNTER — Other Ambulatory Visit: Payer: Self-pay | Admitting: Family Medicine

## 2021-10-21 DIAGNOSIS — F3341 Major depressive disorder, recurrent, in partial remission: Secondary | ICD-10-CM

## 2021-10-22 ENCOUNTER — Ambulatory Visit (INDEPENDENT_AMBULATORY_CARE_PROVIDER_SITE_OTHER): Payer: Medicare Other

## 2021-10-22 ENCOUNTER — Other Ambulatory Visit: Payer: Self-pay

## 2021-10-22 DIAGNOSIS — R0989 Other specified symptoms and signs involving the circulatory and respiratory systems: Secondary | ICD-10-CM | POA: Diagnosis not present

## 2021-10-22 DIAGNOSIS — I6523 Occlusion and stenosis of bilateral carotid arteries: Secondary | ICD-10-CM

## 2021-10-22 DIAGNOSIS — I6529 Occlusion and stenosis of unspecified carotid artery: Secondary | ICD-10-CM

## 2021-10-22 NOTE — Telephone Encounter (Signed)
Left voicemail message to call back for review of results.  

## 2021-10-22 NOTE — Progress Notes (Unsigned)
Per Dr. Rockey Situ after review of her carotid ultrasound pt had done today. Dr. Rockey Situ requesting a carotid CT w/contrast.  Order placed for CTA neck as explained by radiology for correct order.  Pt to go to Outpatient imaging tomorrow at 10:30 am, daughter to drive her to exam.   Expressed only clear liquids four hours prior. Suggested drinking plenty of water to stay hydrated. Mrs. Toops verbalized understanding. Advised will call her with results once Dr. Rockey Situ has reviewed. Mrs. Ranganathan is thankful for speaking with her and will wait for results after her scan tomorrow.

## 2021-10-22 NOTE — Telephone Encounter (Signed)
Patient returning call  Please call [phone]

## 2021-10-22 NOTE — Telephone Encounter (Signed)
Reviewed results with patient and she verbalized understanding with no further questions at this time.  

## 2021-10-23 ENCOUNTER — Telehealth: Payer: Self-pay | Admitting: *Deleted

## 2021-10-23 ENCOUNTER — Ambulatory Visit
Admission: RE | Admit: 2021-10-23 | Discharge: 2021-10-23 | Disposition: A | Discharge status: Home / Self Care | Payer: Medicare Other | Source: Ambulatory Visit | Attending: Cardiovascular Disease | Admitting: Cardiovascular Disease

## 2021-10-23 DIAGNOSIS — I6529 Occlusion and stenosis of unspecified carotid artery: Secondary | ICD-10-CM

## 2021-10-23 DIAGNOSIS — I672 Cerebral atherosclerosis: Secondary | ICD-10-CM | POA: Diagnosis not present

## 2021-10-23 DIAGNOSIS — I5043 Acute on chronic combined systolic (congestive) and diastolic (congestive) heart failure: Secondary | ICD-10-CM

## 2021-10-23 DIAGNOSIS — I6523 Occlusion and stenosis of bilateral carotid arteries: Secondary | ICD-10-CM

## 2021-10-23 DIAGNOSIS — R0989 Other specified symptoms and signs involving the circulatory and respiratory systems: Secondary | ICD-10-CM | POA: Diagnosis not present

## 2021-10-23 MED ORDER — IOHEXOL 350 MG/ML SOLN
60.0000 mL | Freq: Once | INTRAVENOUS | Status: AC | PRN
  Administered 2021-10-23: 60 mL via INTRAVENOUS

## 2021-10-23 NOTE — Telephone Encounter (Signed)
No answer. No voicemail. 

## 2021-10-23 NOTE — Addendum Note (Signed)
Addended by: Anselm Pancoast on: 10/23/2021 01:43 PM   Modules accepted: Orders

## 2021-10-23 NOTE — Telephone Encounter (Signed)
Please call daughter to review results from yesterday.

## 2021-10-23 NOTE — Telephone Encounter (Signed)
Continue to control risk factors and monitor for symptoms consistent with cerebrovascular events.  May be able to medically manage.

## 2021-10-23 NOTE — Telephone Encounter (Signed)
Reviewed results and recommendations from both monitor and carotid testing. She inquired if she needs to monitor for any particular symptoms or changes. Advised that there is not any based on recommendations. She then stated that patient is not as active and that she has been pushing her to walk more so that she does not get to where she can't move. Encouraged her to continue having her walk as well. She just wanted to know if she should be worried about anything. Reviewed that referral to vascular surgeon is based on carotid results so they can weigh in on the findings. Provided reassurance on results and referral as well. She was very appreciative with no further questions at this time.

## 2021-10-23 NOTE — Telephone Encounter (Signed)
Spoke with patient and reviewed results and recommendations. Orders placed and advised someone will be in touch to schedule her appointment. She verbalized understanding with no further questions at this time.

## 2021-10-23 NOTE — Telephone Encounter (Signed)
-----   Message from [name], PA-C sent at 10/22/2021  4:32 PM EST ----- Please inform the patient her carotid artery ultrasound showed stable 1-39% bilateral ICA stenosis. On the right side, there was severe external carotid artery stenosis (> 50%) and < 50% CCA stenosis. On the left side, there was > 50% CCA stenosis. The stenosis in the left ICA and ECA may be underestimated due to proximal mid CCA stenosis.   Recommendations: -Follow up carotid artery ultrasound in 6 months -Please refer the patient to vascular surgery, per reading MD recommendation

## 2021-10-26 ENCOUNTER — Ambulatory Visit (INDEPENDENT_AMBULATORY_CARE_PROVIDER_SITE_OTHER): Payer: Medicare Other | Admitting: Family Medicine

## 2021-10-26 ENCOUNTER — Other Ambulatory Visit: Payer: Self-pay

## 2021-10-26 ENCOUNTER — Encounter: Payer: Self-pay | Admitting: Family Medicine

## 2021-10-26 VITALS — BP 145/64 | HR 68 | Temp 98.4°F | Resp 18 | Ht 63.0 in | Wt 162.0 lb

## 2021-10-26 DIAGNOSIS — I10 Essential (primary) hypertension: Secondary | ICD-10-CM | POA: Diagnosis not present

## 2021-10-26 DIAGNOSIS — I5043 Acute on chronic combined systolic (congestive) and diastolic (congestive) heart failure: Secondary | ICD-10-CM | POA: Diagnosis not present

## 2021-10-26 DIAGNOSIS — R197 Diarrhea, unspecified: Secondary | ICD-10-CM

## 2021-10-26 DIAGNOSIS — N1831 Chronic kidney disease, stage 3a: Secondary | ICD-10-CM

## 2021-10-26 DIAGNOSIS — A0471 Enterocolitis due to Clostridium difficile, recurrent: Secondary | ICD-10-CM

## 2021-10-26 DIAGNOSIS — A0472 Enterocolitis due to Clostridium difficile, not specified as recurrent: Secondary | ICD-10-CM

## 2021-10-26 NOTE — Progress Notes (Signed)
° °  SUBJECTIVE:   CHIEF COMPLAINT / HPI:   HOSPITAL FOLLOW UP Hospital/facility: [facility] 1/2-1/4 Diagnosis: CHF exacerbation Procedures/tests:  - ECHO LVEF 40% with global hypokinesis Consultants:  New medications:  Discharge instructions:   Status: stable - saw Cardiology 1/6, started on potassium, torsemide prn for weight gain. Dry weight 163lb. - cough when laying down. Some PND.  - only needed to take torsemide once since discharge. - still with fatigue  C diff colitis - hospitalization 12/8-12/10 at [facility] for near syncope 2/2 dehydration, diarrhea. Rx oral vancomycin. Doesn't think she got over it. Still having 4-5 stools per day, loose. No blood in stool. No vomiting. No fevers.    OBJECTIVE:   BP (!) 145/64 (BP Location: Right Arm, Patient Position: Sitting, Cuff Size: Normal)    Pulse 68    Temp 98.4 F (36.9 C) (Temporal)    Resp 18    Ht 5\' 3"  (1.6 m)    Wt 162 lb (73.5 kg)    SpO2 96%    BMI 28.70 kg/m   Gen: elderly, well appearing, in NAD Card: RRR Lungs: CTAB. Very faint bibasilar crackles.  Ext: WWP, trace edema to b/l lower shins  Depression screen [facility] 2/9 10/26/2021 08/21/2020 05/19/2020  Decreased Interest 0 0 0  Down, Depressed, Hopeless 0 0 0  PHQ - 2 Score 0 0 0  Altered sleeping 0 2 3  Tired, decreased energy 3 0 0  Change in appetite 0 0 0  Feeling bad or failure about yourself  0 0 0  Trouble concentrating 0 0 0  Moving slowly or fidgety/restless 0 0 0  Suicidal thoughts 0 0 0  PHQ-9 Score 3 2 3   Difficult doing work/chores Not difficult at all Not difficult at all Not difficult at all  Some recent data might be hidden     ASSESSMENT/PLAN:   Acute on chronic combined systolic and diastolic CHF (congestive heart failure) (HCC) Still symptomatic with evidence of slight volume overload today. Will have patient take prn torsemide today and monitor weight and output. Suspect part of fatigue may be 2/2 grief given recent passing of daughter with brain  cancer though PHQ unremarkable, will continue to monitor. Obtaining labs today.  Has Cardiology f/u in few weeks. F/u with [name] in 3 months.  Benign essential HTN At goal for age, continue current regimen.  CKD (chronic kidney disease), stage IIIa Recently worsened with dehydration from Cdiff colitis, Cr at baseline at most recent discharge. Checking labs today.  C. difficile diarrhea S/p abx. Still with symptoms, will recheck stool studies.      Myles Gip, DO

## 2021-10-26 NOTE — Patient Instructions (Signed)
It was great to see you!  Our plans for today:  - Take an extra torsemide dose today.  - We are checking some labs today, we will release these results to your MyChart. - Come back in 3 months.  Take care and seek immediate care sooner if you develop any concerns.   Dr. Ky Barban

## 2021-10-27 LAB — CBC
Hematocrit: 35.6 % (ref 34.0–46.6)
Hemoglobin: 12.2 g/dL (ref 11.1–15.9)
MCH: 32.4 pg (ref 26.6–33.0)
MCHC: 34.3 g/dL (ref 31.5–35.7)
MCV: 94 fL (ref 79–97)
Platelets: 156 10*3/uL (ref 150–450)
RBC: 3.77 x10E6/uL (ref 3.77–5.28)
RDW: 14 % (ref 11.7–15.4)
WBC: 10.4 10*3/uL (ref 3.4–10.8)

## 2021-10-27 LAB — BASIC METABOLIC PANEL
BUN/Creatinine Ratio: 18 (ref 12–28)
BUN: 27 mg/dL (ref 8–27)
CO2: 25 mmol/L (ref 20–29)
Calcium: 9.1 mg/dL (ref 8.7–10.3)
Chloride: 102 mmol/L (ref 96–106)
Creatinine, Ser: 1.49 mg/dL — ABNORMAL HIGH (ref 0.57–1.00)
Glucose: 119 mg/dL — ABNORMAL HIGH (ref 70–99)
Potassium: 5.1 mmol/L (ref 3.5–5.2)
Sodium: 144 mmol/L (ref 134–144)
eGFR: 33 mL/min/{1.73_m2} — ABNORMAL LOW (ref >59)

## 2021-10-27 NOTE — Assessment & Plan Note (Addendum)
Still symptomatic with evidence of slight volume overload today. Will have patient take prn torsemide today and monitor weight and output. Suspect part of fatigue may be 2/2 grief given recent passing of daughter with brain cancer though PHQ unremarkable, will continue to monitor. Obtaining labs today.  Has Cardiology f/u in few weeks. F/u with [name] in 3 months.

## 2021-10-27 NOTE — Assessment & Plan Note (Signed)
Recently worsened with dehydration from Cdiff colitis, Cr at baseline at most recent discharge. Checking labs today.

## 2021-10-27 NOTE — Assessment & Plan Note (Signed)
At goal for age, continue current regimen.

## 2021-10-27 NOTE — Assessment & Plan Note (Signed)
S/p abx. Still with symptoms, will recheck stool studies.

## 2021-10-30 ENCOUNTER — Ambulatory Visit: Payer: Medicare Other | Admitting: Physician Assistant

## 2021-11-04 DIAGNOSIS — R197 Diarrhea, unspecified: Secondary | ICD-10-CM | POA: Diagnosis not present

## 2021-11-06 ENCOUNTER — Other Ambulatory Visit: Payer: Self-pay

## 2021-11-06 MED ORDER — FIDAXOMICIN 200 MG PO TABS
200.0000 mg | ORAL_TABLET | Freq: Two times a day (BID) | ORAL | 0 refills | Status: AC
  Filled 2021-11-06: qty 20, 10d supply, fill #0

## 2021-11-06 NOTE — Addendum Note (Signed)
Addended by: Myles Gip on: 11/06/2021 12:41 PM   Modules accepted: Orders

## 2021-11-09 ENCOUNTER — Ambulatory Visit: Payer: Self-pay | Admitting: *Deleted

## 2021-11-09 ENCOUNTER — Ambulatory Visit: Payer: Medicare Other | Admitting: Family Medicine

## 2021-11-09 NOTE — Telephone Encounter (Signed)
Reason for Disposition  Patient sounds very sick or weak to the triager    Has C Diff.  Very weak, lost 4 lbs  Answer Assessment - Initial Assessment Questions 1. DIARRHEA SEVERITY: "How bad is the diarrhea?" "How many more stools have you had in the past 24 hours than normal?"    - NO DIARRHEA (SCALE 0)   - MILD (SCALE 1-3): Few loose or mushy BMs; increase of 1-3 stools over normal daily number of stools; mild increase in ostomy output.   -  MODERATE (SCALE 4-7): Increase of 4-6 stools daily over normal; moderate increase in ostomy output. * SEVERE (SCALE 8-10; OR 'WORST POSSIBLE'): Increase of 7 or more stools daily over normal; moderate increase in ostomy output; incontinence.     I'm talking with daughter and pt.    She wanted to know about Dificid does it get rid of the C Diff or the diarrhea?   She wasn't sure.   A family member looked on line and found out C diff can reoccur.   She was in the hospital and diagnosed with C diff. She is losing weight, and she is weak and still having the extreme diarrhea.   She can't sleep.     She's eating yogurt, and chicken noodle soup.   She's drinking her fluids.   We're trying to keep her out of the hospital due to hydration again.    She's lost 4 lbs.    Pt is isolated to her own bathroom.    There are 4 people living here in the house and I know it's contagious.    She's not getting much of anything in her.   No appetite.  2. ONSET: "When did the diarrhea begin?"      Last BM yesterday it was runny.    I'm having a lot of gas.   No diarrhea but if she moves around it starts having diarrhea.     3. BM CONSISTENCY: "How loose or watery is the diarrhea?"      It was yesterday.   No stool today.  4. VOMITING: "Are you also vomiting?" If Yes, ask: "How many times in the past 24 hours?"      *No Answer* 5. ABDOMINAL PAIN: "Are you having any abdominal pain?" If Yes, ask: "What does it feel like?" (e.g., crampy, dull, intermittent, constant)      *No  Answer* 6. ABDOMINAL PAIN SEVERITY: If present, ask: "How bad is the pain?"  (e.g., Scale 1-10; mild, moderate, or severe)   - MILD (1-3): doesn't interfere with normal activities, abdomen soft and not tender to touch    - MODERATE (4-7): interferes with normal activities or awakens from sleep, abdomen tender to touch    - SEVERE (8-10): excruciating pain, doubled over, unable to do any normal activities       *No Answer* 7. ORAL INTAKE: If vomiting, "Have you been able to drink liquids?" "How much liquids have you had in the past 24 hours?"     She's drinking liquids but not eating much of anything.   No appetite. 8. HYDRATION: "Any signs of dehydration?" (e.g., dry mouth [not just dry lips], too weak to stand, dizziness, new weight loss) "When did you last urinate?"     *No Answer* 9. EXPOSURE: "Have you traveled to a foreign country recently?" "Have you been exposed to anyone with diarrhea?" "Could you have eaten any food that was spoiled?"     *No Answer* 10. ANTIBIOTIC  USE: "Are you taking antibiotics now or have you taken antibiotics in the past 2 months?"       Dificid 200 mg  11. OTHER SYMPTOMS: "Do you have any other symptoms?" (e.g., fever, blood in stool)       *No Answer* 12. PREGNANCY: "Is there any chance you are pregnant?" "When was your last menstrual period?"       *No Answer*  Protocols used: [place]  Chief Complaint: Has C-Diff.   Very weak, has lost 4 lbs, no appetite, eating very little Symptoms: diarrhea, extreme weakness, lost 4 lbs Frequency: Last watery stool was yesterday.   None today but not up moving around.   Daughter very concerned about the weakness and weight loss Pertinent Negatives: Patient denies diarrhea today.   Taking Dificid 200 mg Disposition: [] ED /[] Urgent Care (no appt availability in office) / [] Appointment(In office/virtual)/ []  [facility]/ [] Home Care/ [] Refused Recommended Disposition /[] [place] [facility]/ []  Follow-up  with PCP Additional Notes: I returned daughter Millie Forde and pt's call.  They called in asking about the Dificid if it was for the C Diff or the diarrhea.   I let her know it was for the C Diff thus to get rid of the diarrhea.   [name] was also concerned about her mother being very weak, no appetite and has lost 4 lbs.  I called into [facility] and spoke with Sharyn Lull with [name] and Halina Maidens conferenced in and got a telephone appt with Dr. Ky Barban for 11/10/2021 at 9:40.  They were agreeable to this appt (Dr. Ky Barban out of the office today) and will work on trying to get her to eat today.

## 2021-11-10 ENCOUNTER — Other Ambulatory Visit: Payer: Self-pay

## 2021-11-10 ENCOUNTER — Other Ambulatory Visit: Payer: Self-pay | Admitting: *Deleted

## 2021-11-10 ENCOUNTER — Ambulatory Visit (INDEPENDENT_AMBULATORY_CARE_PROVIDER_SITE_OTHER): Payer: Medicare Other | Admitting: Family Medicine

## 2021-11-10 DIAGNOSIS — A0472 Enterocolitis due to Clostridium difficile, not specified as recurrent: Secondary | ICD-10-CM

## 2021-11-10 DIAGNOSIS — A0471 Enterocolitis due to Clostridium difficile, recurrent: Secondary | ICD-10-CM

## 2021-11-10 DIAGNOSIS — R197 Diarrhea, unspecified: Secondary | ICD-10-CM

## 2021-11-10 LAB — STOOL CULTURE: E coli, Shiga toxin Assay: NEGATIVE

## 2021-11-10 LAB — C DIFFICILE TOXINS A+B W/RFLX: C difficile Toxins A+B, EIA: POSITIVE — AB

## 2021-11-10 NOTE — Progress Notes (Signed)
Virtual telephone visit    Virtual Visit via Telephone Note   This visit type was conducted due to national recommendations for restrictions regarding the COVID-19 Pandemic (e.g. social distancing) in an effort to limit this patient's exposure and mitigate transmission in our community. Due to her co-morbid illnesses, this patient is at least at moderate risk for complications without adequate follow up. This format is felt to be most appropriate for this patient at this time. The patient did not have access to video technology or had technical difficulties with video requiring transitioning to audio format only (telephone). Physical exam was limited to content and character of the telephone converstion.    Patient location: home Provider location: home  I discussed the limitations of evaluation and management by telemedicine and the availability of in person appointments. The patient expressed understanding and agreed to proceed.   Visit Date: 11/10/2021  Today's healthcare provider: Myles Gip, DO   No chief complaint on file.  Subjective    HPI   C diff colitis - hospitalization 12/8-12/10 at [facility] for near syncope 2/2 dehydration, diarrhea. Rx oral vancomycin. ED visit 12/15 for persistent symptoms, switched to fidaxomicin. Did not finish fidaxomicin, has 6 pills left.  - seen subsequently 1/23 with continued persistent symptoms, 4-5 loose stools per day. Stool culture negative, C diff toxin still positive. Rx fidaxomicin at that time. - taking fidaxomicin durrently.  - diarrhea improved, 2 small stools yesterday. - still weak, urinated few times yesterday. - denies lightheadedness, dizziness.    Medications: Outpatient Medications Prior to Visit  Medication Sig   albuterol (PROVENTIL) (2.5 MG/3ML) 0.083% nebulizer solution USE 1 VIAL IN NEBULIZER EVERY 4 HOURS AS NEEDED (Patient taking differently: Take 2.5 mg by nebulization every 4 (four) hours as needed for  wheezing or shortness of breath.)   allopurinol (ZYLOPRIM) 300 MG tablet Take 1 tablet by mouth once daily (Patient taking differently: Take 300 mg by mouth daily.)   amitriptyline (ELAVIL) 10 MG tablet TAKE 1 TABLET BY MOUTH AT BEDTIME (Patient taking differently: Take 10 mg by mouth at bedtime.)   ASPIRIN LOW DOSE 81 MG EC tablet NEW PRESCRIPTION REQUEST: TAKE ONE TABLET BY MOUTH EVERY DAY (Patient taking differently: Take 81 mg by mouth daily.)   atorvastatin (LIPITOR) 40 MG tablet Take 1 tablet (40 mg total) by mouth daily.   carvedilol (COREG) 12.5 MG tablet Take 1 tablet (12.5 mg total) by mouth 2 (two) times daily.   clopidogrel (PLAVIX) 75 MG tablet Take 1 tablet (75 mg total) by mouth daily.   Ferrous Sulfate 27 MG TABS Take 27 mg by mouth at bedtime.   fidaxomicin (DIFICID) 200 MG TABS tablet Take 1 tablet (200 mg total) by mouth 2 (two) times daily for 10 days.   hydrALAZINE (APRESOLINE) 50 MG tablet Take 50 mg three times a day, For high blood pressure >150 Take extra hydralazine   hydrocortisone 2.5 % cream Apply 1 application topically 2 (two) times daily as needed (for skin irritation).   isosorbide mononitrate (IMDUR) 30 MG 24 hr tablet Take 1 tablet (30 mg total) by mouth daily. For high blood pressure >150 can take extra isosorbide   levothyroxine (SYNTHROID) 137 MCG tablet Take 1 tablet by mouth once daily   Melatonin 10 MG TABS Take 20 mg by mouth at bedtime.   mirtazapine (REMERON) 30 MG tablet Take 1 tablet (30 mg total) by mouth at bedtime.   Multiple Vitamin (MULTIVITAMIN) capsule Take 1 capsule by mouth  every evening.   pantoprazole (PROTONIX) 40 MG tablet Take 1 tablet by mouth once daily   potassium chloride (KLOR-CON) 10 MEQ tablet Take potassium 20 meq when you take torsemide   sacubitril-valsartan (ENTRESTO) 49-51 MG Take 1 tablet by mouth 2 (two) times daily.   sertraline (ZOLOFT) 25 MG tablet Take 1 tablet by mouth once daily   torsemide (DEMADEX) 20 MG tablet Take  1 tablet (20 mg total) by mouth daily as needed. For shortness of breath and For weight 163 or high with a potassium tab   No facility-administered medications prior to visit.        Objective    There were no vitals taken for this visit.     Assessment & Plan     Problem List Items Addressed This Visit       Digestive   C. difficile diarrhea - Primary    Improving once restarted fidaxomicin, instructed to complete entire course. Will follow up after completion to ensure continued resolution. If remains symptomatic after finishing, recommend GI evaluation. Work to stay hydrated.          I discussed the assessment and treatment plan with the patient. The patient was provided an opportunity to ask questions and all were answered. The patient agreed with the plan and demonstrated an understanding of the instructions.   The patient was advised to call back or seek an in-person evaluation if the symptoms worsen or if the condition fails to improve as anticipated.  I provided 10 minutes of non-face-to-face time during this encounter.   Myles Gip, [place] [phone] (phone) [phone] (fax)  [place]

## 2021-11-10 NOTE — Assessment & Plan Note (Addendum)
Improving once restarted fidaxomicin, instructed to complete entire course. Will follow up after completion to ensure continued resolution. If remains symptomatic after finishing, recommend GI evaluation. Work to stay hydrated.

## 2021-11-11 ENCOUNTER — Ambulatory Visit: Payer: Self-pay

## 2021-11-11 NOTE — Telephone Encounter (Signed)
2nd attempt, Pt called, unable to leave VM d/t line keeps ringing.

## 2021-11-11 NOTE — Telephone Encounter (Signed)
Reason for Disposition  Health Information question, no triage required and triager able to answer question  Additional Information  Commented on: Answer Assessment    See note  Protocols used: Information Only Call - No Triage-A-AH

## 2021-11-11 NOTE — Telephone Encounter (Signed)
Chief Complaint: advice Symptoms: Cdiff contagious period Frequency: NA Pertinent Negatives: NA Disposition: [] ED /[] Urgent Care (no appt availability in office) / [] Appointment(In office/virtual)/ []  [place] [facility]/ [x] Home Care/ [] Refused Recommended Disposition /[]  Mobile Bus/ []  Follow-up with PCP Additional Notes: pt was advised to complete abx and once completed entire course if symptoms persist still can be contagious to call office back to let us know about symptoms. Pt verbalized understanding.    Summary: Wants to know how long she is contagious, VM okay    Pt wants to know how long she is contagious, VM okay  [phone]         Answer Assessment - Initial Assessment Questions 1. REASON FOR CALL or QUESTION: "What is your reason for calling today?" or "How can I best help you?" or "What question do you have that I can help answer?"     Pt is wanting to know how long she is contagious with Cdiff  Protocols used: Information Only Call - No Triage-A-AH  Additional Information  Commented on: Answer Assessment    See note  Protocols used: Information Only Call - No Triage-A-AH

## 2021-11-11 NOTE — Telephone Encounter (Signed)
Pt called, line keeps ringing so unable to leave VM.  Summary: Wants to know how long she is contagious, VM okay   Pt wants to know how long she is contagious, VM okay  [phone]       Answer Assessment - Initial Assessment Questions 1. REASON FOR CALL or QUESTION: "What is your reason for calling today?" or "How can I best help you?" or "What question do you have that I can help answer?"     Pt is wanting to know how long she is contagious with Cdiff  Protocols used: Information Only Call - No Triage-A-AH

## 2021-11-18 ENCOUNTER — Other Ambulatory Visit: Payer: Self-pay | Admitting: *Deleted

## 2021-11-18 NOTE — Progress Notes (Addendum)
Cardiology Office Note:    Date:  11/19/2021   ID:  Stephanie Charles, DOB 1930-01-02, MRN [id number]  PCP:  Jerrol Banana., MD   [facility] Providers Cardiologist:  Ida Rogue, MD     Referring MD: Jerrol Banana.,*   6-week follow-up   History of Present Illness:    Stephanie Charles is a 88 y.o. female with a hx of CAD and NSTEMI 2020 with PCI of 95%LAD lesion. Ischemic cardiomyopathy [name]), CKD stage III (Cr 1.49) ,HTN, HLD, LBBB,TIA/CVA 2016, and carotid artery disease. She was readmitted 6/22 with COVID and treated for vomiting and diarrhea. Post discharge she became bradycardic while on the toilet and presented again to the hospital and treated with IV fluids, steroids and antiemetics. In December 2022 she was admitted to [facility] where she reported multiple bouts of diarrhea given PCR and found to be positive for C. Difficile and treated with vancomycin and fosfomycin by the ED. She was seen by Christell Faith, PA in December for post hospital follow up and was ordered a Zio patch monitor to evaluate syncope. She appeared euvolemic at that visit with titration to carvedilol to 25 mg twice daily with continuation of Entresto 49/51 mg twice daily.She was also directed to limit torsemide due to possible volume loss/syncope. Zio patch showed rate of 70 bpm, (range 41 to 185 bpm),1 run of NSVT noted lasting 8 beats, 2 episodes of SVT with the fastest and longest interval lasting 9 beats, occasional PACs, rare atrial couplets, atrial triplets, occasional PVCs, rare ventricular couplets, and ventricular triplets.  She was recently hospitalized January for shortness of breath and pulmonary edema, uncontrolled hypertension.  BNP was 859.Carotid U/S performed and revealed stable 1-39% bilateral ICA stenosis and right severe external carotid artery stenosis (> 50%) and < 50% CCA stenosis, left was > 50% CCA stenosis. A referral was placed for vascular surgery. She was last seen by Dr.  Rockey Situ for 2 month follow-up for ICM and uncontrolled HTN with pulmonary edema.She was started on hydralazine for HTN.Echo performed 10/07/21 showed EF 40% with global hypokinesis moderate concentric left ventricular hypertrophy, trivial mitral regurg, calcification of the aortic valve without evidence of aortic stenosis.    She presents today for 6 week follow up and reports that she is feeling better today.She was alone today for this visit.She reports that her diarrhea has stopped for the past week and she does have some shortness of breath when putting on clothing and doing ADLs around the house.  She denies chest pain orthopnea and PND.Her weight is down 3 pounds since last office visit.  She has not had to use her Demadex since her last office visit.She does report feeling an occasional skipped beat when laying down but this is not sustained. Her blood pressure today was 160/60 and she reports this is similar to her home blood pressures.She is drinking 1-1/2 L of fluid per day and states that her diet is better than before diarrhea issue.She states that her energy level is still down since her COVID diagnosis last year.      Past Surgical History:  Procedure Laterality Date   COLONOSCOPY WITH PROPOFOL N/A 06/22/2018   Procedure: COLONOSCOPY WITH PROPOFOL;  Surgeon: Jonathon Bellows, MD;  Location: [facility] ENDOSCOPY;  Service: Endoscopy;  Laterality: N/A;   CORONARY STENT INTERVENTION N/A 06/07/2019   Procedure: CORONARY STENT INTERVENTION;  Surgeon: [name], MD;  Location: [facility];  Service: Cardiovascular;  Laterality: N/A;  ESOPHAGOGASTRODUODENOSCOPY (EGD) WITH PROPOFOL N/A 06/22/2018   Procedure: ESOPHAGOGASTRODUODENOSCOPY (EGD) WITH PROPOFOL;  Surgeon: Jonathon Bellows, MD;  Location: [facility] ENDOSCOPY;  Service: Endoscopy;  Laterality: N/A;   KNEE ARTHROSCOPY Right    REPLACEMENT TOTAL KNEE BILATERAL     RIGHT/LEFT HEART CATH AND CORONARY ANGIOGRAPHY N/A 06/07/2019   Procedure:  RIGHT/LEFT HEART CATH AND CORONARY ANGIOGRAPHY;  Surgeon: [name], MD;  Location: [place] [facility];  Service: Cardiovascular;  Laterality: N/A;   SHOULDER SURGERY Left     Current Medications: Current Meds  Medication Sig   albuterol (PROVENTIL) (2.5 MG/3ML) 0.083% nebulizer solution USE 1 VIAL IN NEBULIZER EVERY 4 HOURS AS NEEDED (Patient taking differently: Take 2.5 mg by nebulization every 4 (four) hours as needed for wheezing or shortness of breath.)   allopurinol (ZYLOPRIM) 300 MG tablet Take 1 tablet by mouth once daily (Patient taking differently: Take 300 mg by mouth daily.)   amitriptyline (ELAVIL) 10 MG tablet TAKE 1 TABLET BY MOUTH AT BEDTIME (Patient taking differently: Take 10 mg by mouth at bedtime.)   ASPIRIN LOW DOSE 81 MG EC tablet NEW PRESCRIPTION REQUEST: TAKE ONE TABLET BY MOUTH EVERY DAY (Patient taking differently: Take 81 mg by mouth daily.)   atorvastatin (LIPITOR) 40 MG tablet Take 1 tablet (40 mg total) by mouth daily.   carvedilol (COREG) 12.5 MG tablet Take 1 tablet (12.5 mg total) by mouth 2 (two) times daily.   clopidogrel (PLAVIX) 75 MG tablet Take 1 tablet (75 mg total) by mouth daily.   Ferrous Sulfate 27 MG TABS Take 27 mg by mouth at bedtime.   hydrALAZINE (APRESOLINE) 50 MG tablet Take 50 mg three times a day, For high blood pressure >150 Take extra hydralazine   hydrocortisone 2.5 % cream Apply 1 application topically 2 (two) times daily as needed (for skin irritation).   isosorbide mononitrate (IMDUR) 30 MG 24 hr tablet Take 1 tablet (30 mg total) by mouth daily. For high blood pressure >150 can take extra isosorbide   levothyroxine (SYNTHROID) 137 MCG tablet Take 1 tablet by mouth once daily   Melatonin 10 MG TABS Take 20 mg by mouth at bedtime.   mirtazapine (REMERON) 30 MG tablet Take 1 tablet (30 mg total) by mouth at bedtime.   Multiple Vitamin (MULTIVITAMIN) capsule Take 1 capsule by mouth every evening.   pantoprazole (PROTONIX) 40 MG  tablet Take 1 tablet by mouth once daily   potassium chloride (KLOR-CON) 10 MEQ tablet Take potassium 20 meq when you take torsemide   sertraline (ZOLOFT) 25 MG tablet Take 1 tablet by mouth once daily   torsemide (DEMADEX) 20 MG tablet Take 1 tablet (20 mg total) by mouth daily as needed. For shortness of breath and For weight 163 or high with a potassium tab   [DISCONTINUED] sacubitril-valsartan (ENTRESTO) 49-51 MG Take 1 tablet by mouth 2 (two) times daily.   [DISCONTINUED] sacubitril-valsartan (ENTRESTO) 97-103 MG Take 1 tablet by mouth 2 (two) times daily.     Allergies:   Latex, Levofloxacin, Lisinopril, Povidone-iodine, Simvastatin, and Codeine   Social History   Socioeconomic History   Marital status: Divorced    Spouse name: na   Number of children: 4   Years of education: 14   Highest education level: Associate degree: occupational, Hotel manager, or vocational program  Occupational History   Occupation: retired  Tobacco Use   Smoking status: Never   Smokeless tobacco: Never  Vaping Use   Vaping Use: Never used  Substance and Sexual  Activity   Alcohol use: No    Alcohol/week: 0.0 standard drinks   Drug use: No   Sexual activity: Never  Other Topics Concern   Not on file  Social History Narrative   Not on file   Social Determinants of Health   Financial Resource Strain: Not on file  Food Insecurity: Not on file  Transportation Needs: Not on file  Physical Activity: Not on file  Stress: Not on file  Social Connections: Not on file     Family History: The patient's family history includes Alzheimer's disease in her brother and brother; Breast cancer in her sister; Cancer in her brother; Clotting disorder in her daughter; Fibromyalgia in her daughter; Heart attack in her brother; Heart disease in her brother, brother, father, and mother; Hypertension in her mother; Mental illness in her brother; Stroke in her brother and mother.  ROS:   Please see the history of  present illness.    Review of Systems  Cardiovascular:  Positive for palpitations. Negative for chest pain, orthopnea, leg swelling and PND.       Occasion when lying down  Gastrointestinal:  Negative for diarrhea.    All other systems reviewed and are negative.  EKGs/Labs/Other Studies Reviewed:    The following studies were reviewed today:   EKG:  EKG was  ordered today.The ekg ordered today demonstrates sinus bradycardia rate of 58 with first-degree AV block left bundle branch block QRS duration 146 ms  Recent Labs: 10/05/2021: ALT 16; TSH 4.121 10/07/2021: B Natriuretic Peptide 355.0; Magnesium 1.8 10/26/2021: BUN 27; Creatinine, Ser 1.49; Hemoglobin 12.2; Platelets 156; Potassium 5.1; Sodium 144  Recent Lipid Panel    Component Value Date/Time   CHOL 118 03/07/2021 0514   CHOL 115 03/13/2020 1440   CHOL 120 10/19/2011 0323   TRIG 83 03/07/2021 0514   TRIG 152 10/19/2011 0323   HDL 33 (L) 03/07/2021 0514   HDL 29 (L) 03/13/2020 1440   HDL 15 (L) 10/19/2011 0323   CHOLHDL 3.6 03/07/2021 0514   VLDL 17 03/07/2021 0514   VLDL 30 10/19/2011 0323   LDLCALC 68 03/07/2021 0514   LDLCALC 53 03/13/2020 1440   LDLCALC 75 10/19/2011 0323     Risk Assessment/Calculations:        Physical Exam:    VS: Today's Vitals   11/19/21 1321  BP: (!) 160/60  Pulse: (!) 58  SpO2: 98%  Weight: 158 lb 4 oz (71.8 kg)  Height: 5\' 2"  (1.575 m)   Body mass index is 28.94 kg/m.    GEN:  Well nourished, well developed in no acute distress HEENT: Normal NECK: No JVD; No carotid bruits LYMPHATICS: No lymphadenopathy CARDIAC: Normal RRR, Carotid pulses are  on the right side with bruit. Posterior tibial pulses are 2+ on the right side and 2+ on the left side.  Heart sounds: S1 present and S2 Present RESPIRATORY:  Clear to auscultation without rales, wheezing or rhonchi  ABDOMEN: Soft, non-tender, non-distended MUSCULOSKELETAL:  No edema; No deformity  SKIN: Warm and dry NEUROLOGIC:   Alert and oriented x 3 PSYCHIATRIC:  Normal affect   ASSESSMENT:    1. Coronary artery disease involving native coronary artery of native heart without angina pectoris   2. Acute on chronic combined systolic and diastolic congestive heart failure ([place])   3. Benign essential HTN   4. Stage 3 chronic kidney disease, unspecified whether stage 3a or 3b CKD ([place])   5. Bilateral carotid artery stenosis    PLAN:  In order of problems listed above:  1.CAD:  Continue carvedilol 12.5, aspirin 81 mg, Plavix 75 mg, hydralazine 50 mg, Imdur 30 mg   Patient states that she would like to wait 6 months to have another   2. HFrEF secondary to ICM: GDMT: Entresto increased to 97/103 mg.                                                   Continue to limit sodium content and keep fluid volume under 2 L/day.                                                 Demadex not used for last 6 weeks swelling and fluid status better                                                 Continue carvedilol plan to optimize future GDMT                                                 Patient will call the office to verify that Coreg is 12.5 mg and not 25 mg/day.                                            3. Hypertension: BP remains moderately elevated today                   Increase Entresto titrated up to 97/103 mg, continue hydralazine 50 mg, Imdur 30 mg,  4. CKD stage III: Return lab in 1 week for BMET   5. Carotid Artery Disease: Patient decided not to further escalate treatment options at this time.  6.Disposition: BMET in 1 week and return for follow-up in 1-2 months with Christell Faith, PA         Medication Adjustments/Labs and Tests Ordered: Current medicines are reviewed at length with the patient today.  Concerns regarding medicines are outlined above.  Orders Placed This Encounter  Procedures   Basic metabolic panel   EKG 54-YHCW   Meds ordered this encounter  Medications   DISCONTD:  sacubitril-valsartan (ENTRESTO) 97-103 MG    Sig: Take 1 tablet by mouth 2 (two) times daily.    Dispense:  60 tablet    Refill:  11   sacubitril-valsartan (ENTRESTO) 97-103 MG    Sig: Take 1 tablet by mouth 2 (two) times daily.    Dispense:  180 tablet    Refill:  3    Patient Instructions  Medication Instructions:  Your physician has recommended you make the following change in your medication:   INCREASE Entresto to 97-103 mg twice a day CALL us with your dose of Carvedilol  *If you need a refill on your cardiac medications before your next appointment, please call your pharmacy*   Lab  Work: Art gallery manager in one week  If you have labs (blood work) drawn today and your tests are completely normal, you will receive your results only by: [employer] (if you have MyChart) OR A paper copy in the mail If you have any lab test that is abnormal or we need to change your treatment, we will call you to review the results.   Testing/Procedures: None   Follow-Up: At [facility], you and your health needs are our priority.  As part of our continuing mission to provide you with exceptional heart care, we have created designated Provider Care Teams.  These Care Teams include your primary Cardiologist (physician) and Advanced Practice Providers (APPs -  Physician Assistants and Nurse Practitioners) who all work together to provide you with the care you need, when you need it.   Your next appointment:   1-2 month(s)  The format for your next appointment:   In Person  Provider:   Ida Rogue, MD or Christell Faith, PA-C    Signed, Mable Fill, Marissa Nestle, NP  11/19/2021 4:16 PM    [place]

## 2021-11-18 NOTE — Addendum Note (Signed)
Addended by: Ronaldo Miyamoto on: 11/18/2021 04:41 PM   Modules accepted: Orders

## 2021-11-19 ENCOUNTER — Other Ambulatory Visit: Payer: Self-pay

## 2021-11-19 ENCOUNTER — Telehealth: Payer: Self-pay | Admitting: Physician Assistant

## 2021-11-19 ENCOUNTER — Ambulatory Visit: Payer: Medicare Other | Admitting: Nurse Practitioner

## 2021-11-19 ENCOUNTER — Encounter: Payer: Self-pay | Admitting: Physician Assistant

## 2021-11-19 VITALS — BP 160/60 | HR 58 | Ht 62.0 in | Wt 158.2 lb

## 2021-11-19 DIAGNOSIS — I2510 Atherosclerotic heart disease of native coronary artery without angina pectoris: Secondary | ICD-10-CM | POA: Diagnosis not present

## 2021-11-19 DIAGNOSIS — N183 Chronic kidney disease, stage 3 unspecified: Secondary | ICD-10-CM

## 2021-11-19 DIAGNOSIS — I5043 Acute on chronic combined systolic (congestive) and diastolic (congestive) heart failure: Secondary | ICD-10-CM

## 2021-11-19 DIAGNOSIS — I10 Essential (primary) hypertension: Secondary | ICD-10-CM | POA: Diagnosis not present

## 2021-11-19 DIAGNOSIS — I6523 Occlusion and stenosis of bilateral carotid arteries: Secondary | ICD-10-CM | POA: Diagnosis not present

## 2021-11-19 MED ORDER — SACUBITRIL-VALSARTAN 97-103 MG PO TABS: 1.0000 | ORAL_TABLET | Freq: Two times a day (BID) | ORAL | 3 refills | Status: DC

## 2021-11-19 MED ORDER — SACUBITRIL-VALSARTAN 97-103 MG PO TABS: 1.0000 | ORAL_TABLET | Freq: Two times a day (BID) | ORAL | 11 refills | Status: DC

## 2021-11-19 NOTE — Progress Notes (Addendum)
Patient was walked out and stated she did not want to schedule labs and would have them done at her PCP office. Patient declined to schedule 1-2 month follow up stating that when she last saw Dr. Rockey Situ he told her follow up in 6 months.

## 2021-11-19 NOTE — Patient Instructions (Addendum)
Medication Instructions:  Your physician has recommended you make the following change in your medication:   INCREASE Entresto to 97-103 mg twice a day CALL us with your dose of Carvedilol  *If you need a refill on your cardiac medications before your next appointment, please call your pharmacy*   Lab Work: BMET in one week  If you have labs (blood work) drawn today and your tests are completely normal, you will receive your results only by: [employer] (if you have MyChart) OR A paper copy in the mail If you have any lab test that is abnormal or we need to change your treatment, we will call you to review the results.   Testing/Procedures: None   Follow-Up: At [facility], you and your health needs are our priority.  As part of our continuing mission to provide you with exceptional heart care, we have created designated Provider Care Teams.  These Care Teams include your primary Cardiologist (physician) and Advanced Practice Providers (APPs -  Physician Assistants and Nurse Practitioners) who all work together to provide you with the care you need, when you need it.   Your next appointment:   1-2 month(s)  The format for your next appointment:   In Person  Provider:   Ida Rogue, MD or Christell Faith, PA-C

## 2021-11-19 NOTE — Telephone Encounter (Signed)
Pt c/o medication issue:  1. Name of Medication: carvedilol   2. How are you currently taking this medication (dosage and times per day)? 12.5 mg po BID  3. Are you having a reaction (difficulty breathing--STAT)? No   4. What is your medication issue? Patient confirming dose from visit today

## 2021-11-20 NOTE — Telephone Encounter (Signed)
Patient is currently driving and unable to talk now. She will call me back when she gets home.

## 2021-11-20 NOTE — Telephone Encounter (Signed)
Left voicemail message to call back for review of her medications.

## 2021-11-20 NOTE — Telephone Encounter (Signed)
Spoke with patient and she reports back on 09/29/21 her carvedilol was changed to 25 mg twice a day. When she saw Dr. Rockey Situ back on 10/09/21 her paperwork had 12.5 mg twice a day so at that visit she was confused but continued the 25 mg twice a day. When she came in to our office yesterday her paperwork still had 12.5 mg listed. Advised that I am glad she checked for [name] and I will update in our system. Patient verbalized understanding to continue 25 mg twice a day. She was appreciative for the call back and clarification of her medication.

## 2021-11-20 NOTE — Telephone Encounter (Signed)
Patient returning call.

## 2021-11-23 NOTE — Progress Notes (Signed)
Established patient visit   Patient: Stephanie Charles   DOB: March 23, 1930   88 y.o. Female  MRN: [id number] Visit Date: 11/24/2021  Today's healthcare provider: [name], MD   Chief Complaint  Patient presents with   Follow-up   Subjective    HPI  Her GI symptoms have improved from watery diarrhea to just some not so firm stools.  It is improving with treatment.  Has follow-up appointment with GI in the spring. Their issue is she feels lightheaded.  At times she feels lightheaded or her blood pressures are a little low but her heart rates been low recently when this occurs.  He states recently when she felt lightheaded her blood pressures was 108/50 and heart rate was 41.  He is on Entresto for heart failure but also is on carvedilol and has 2 doses to 25 twice daily and 12.5 twice daily. She has had no syncope recently. Follow up for C. difficile diarrhea:     The patient was last seen for this 2 weeks ago. Changes made at last visit include; seen by Dr. Ky Barban. Improving once restarted fidaxomicin, instructed to complete entire course. Will follow up after completion to ensure continued resolution. If remains symptomatic after finishing, recommend GI evaluation. Work to stay hydrated.  She reports good compliance with treatment. She feels that condition is Unchanged. She is not having side effects. none  -----------------------------------------------------------------------------------------  Patient has had no change in symptoms. She has not had a bowel movement in weeks.  Medications: Outpatient Medications Prior to Visit  Medication Sig   albuterol (PROVENTIL) (2.5 MG/3ML) 0.083% nebulizer solution USE 1 VIAL IN NEBULIZER EVERY 4 HOURS AS NEEDED (Patient taking differently: Take 2.5 mg by nebulization every 4 (four) hours as needed for wheezing or shortness of breath.)   allopurinol (ZYLOPRIM) 300 MG tablet Take 1 tablet by mouth once daily (Patient taking  differently: Take 300 mg by mouth daily.)   amitriptyline (ELAVIL) 10 MG tablet TAKE 1 TABLET BY MOUTH AT BEDTIME (Patient taking differently: Take 10 mg by mouth at bedtime.)   ASPIRIN LOW DOSE 81 MG EC tablet NEW PRESCRIPTION REQUEST: TAKE ONE TABLET BY MOUTH EVERY DAY (Patient taking differently: Take 81 mg by mouth daily.)   atorvastatin (LIPITOR) 40 MG tablet Take 1 tablet (40 mg total) by mouth daily.   carvedilol (COREG) 12.5 MG tablet Take 1 tablet (12.5 mg total) by mouth 2 (two) times daily.   clopidogrel (PLAVIX) 75 MG tablet Take 1 tablet (75 mg total) by mouth daily.   Ferrous Sulfate 27 MG TABS Take 27 mg by mouth at bedtime.   hydrALAZINE (APRESOLINE) 50 MG tablet Take 50 mg three times a day, For high blood pressure >150 Take extra hydralazine   hydrocortisone 2.5 % cream Apply 1 application topically 2 (two) times daily as needed (for skin irritation).   isosorbide mononitrate (IMDUR) 30 MG 24 hr tablet Take 1 tablet (30 mg total) by mouth daily. For high blood pressure >150 can take extra isosorbide   levothyroxine (SYNTHROID) 137 MCG tablet Take 1 tablet by mouth once daily   Melatonin 10 MG TABS Take 20 mg by mouth at bedtime.   mirtazapine (REMERON) 30 MG tablet Take 1 tablet (30 mg total) by mouth at bedtime.   Multiple Vitamin (MULTIVITAMIN) capsule Take 1 capsule by mouth every evening.   pantoprazole (PROTONIX) 40 MG tablet Take 1 tablet by mouth once daily   potassium chloride (KLOR-CON) 10 MEQ  tablet Take potassium 20 meq when you take torsemide   sacubitril-valsartan (ENTRESTO) 97-103 MG Take 1 tablet by mouth 2 (two) times daily.   sertraline (ZOLOFT) 25 MG tablet Take 1 tablet by mouth once daily   torsemide (DEMADEX) 20 MG tablet Take 1 tablet (20 mg total) by mouth daily as needed. For shortness of breath and For weight 163 or high with a potassium tab   No facility-administered medications prior to visit.    Review of Systems  Last CBC Lab Results   Component Value Date   WBC 10.4 10/26/2021   HGB 12.2 10/26/2021   HCT 35.6 10/26/2021   MCV 94 10/26/2021   MCH 32.4 10/26/2021   RDW 14.0 10/26/2021   PLT 156 10/26/2021       Objective    BP (!) 144/58 (BP Location: Right Arm, Patient Position: Sitting, Cuff Size: Normal)    Pulse 61    Temp 97.8 F (36.6 C) (Temporal)    Resp 16    Wt 158 lb 3.2 oz (71.8 kg)    SpO2 96%    BMI 28.94 kg/m  BP Readings from Last 3 Encounters:  11/24/21 (!) 144/58  11/19/21 (!) 160/60  10/26/21 (!) 145/64   Wt Readings from Last 3 Encounters:  11/24/21 158 lb 3.2 oz (71.8 kg)  11/19/21 158 lb 4 oz (71.8 kg)  10/26/21 162 lb (73.5 kg)      Physical Exam Vitals reviewed.  Constitutional:      Appearance: Normal appearance.  HENT:     Right Ear: External ear normal.     Left Ear: External ear normal.     Nose: Nose normal.  Eyes:     General: No scleral icterus.    Conjunctiva/sclera: Conjunctivae normal.  Cardiovascular:     Rate and Rhythm: Normal rate and regular rhythm.     Heart sounds: Normal heart sounds.  Pulmonary:     Effort: Pulmonary effort is normal.     Breath sounds: Normal breath sounds.  Abdominal:     Palpations: Abdomen is soft.     Tenderness: There is no abdominal tenderness.  Lymphadenopathy:     Cervical: No cervical adenopathy.  Skin:    General: Skin is warm and dry.     Comments: Fair skin.  Neurological:     General: No focal deficit present.     Mental Status: She is alert and oriented to person, place, and time.  Psychiatric:        Mood and Affect: Mood normal.        Behavior: Behavior normal.        Thought Content: Thought content normal.      No results found for any visits on 11/24/21.  Assessment & Plan     1. Recurrent Clostridioides difficile diarrhea Patient is much improved. Try probiotic daily 2. Stage 3a chronic kidney disease (HCC) Clinically stable. - Renal function panel  3. Benign essential HTN Thank patient has  mild hypotension at times due to her heart failure treatment especially with Entresto.  He seems to be above 659 systolic when this happens.  4. Chronic combined systolic and diastolic CHF (congestive heart failure) ([place]) As above with hypertension continue Entresto  5. Coronary artery disease of native artery of native heart with stable angina pectoris ([place]) All risk factors treated  6. Near syncope Patient seems to feel that her low heart rate is contributing to her feeling lightheaded and presyncopal episodes.  Decrease carvedilol to  12.5 twice daily from 25 twice daily.  He also has a cardiology follow-up.  Continue all other medications as they are     Return in about 3 weeks (around 12/15/2021).      I, [name], MD, have reviewed all documentation for this visit. The documentation on 11/27/21 for the exam, diagnosis, procedures, and orders are all accurate and complete.     Dary Dilauro Cranford Mon, MD  [facility] [phone] (phone) [phone] (fax)  [place]

## 2021-11-24 ENCOUNTER — Ambulatory Visit (INDEPENDENT_AMBULATORY_CARE_PROVIDER_SITE_OTHER): Payer: Medicare Other | Admitting: Family Medicine

## 2021-11-24 ENCOUNTER — Other Ambulatory Visit: Payer: Self-pay

## 2021-11-24 ENCOUNTER — Encounter: Payer: Self-pay | Admitting: Family Medicine

## 2021-11-24 VITALS — BP 144/58 | HR 61 | Temp 97.8°F | Resp 16 | Wt 158.2 lb

## 2021-11-24 DIAGNOSIS — I10 Essential (primary) hypertension: Secondary | ICD-10-CM | POA: Diagnosis not present

## 2021-11-24 DIAGNOSIS — N1831 Chronic kidney disease, stage 3a: Secondary | ICD-10-CM

## 2021-11-24 DIAGNOSIS — R55 Syncope and collapse: Secondary | ICD-10-CM | POA: Diagnosis not present

## 2021-11-24 DIAGNOSIS — I25118 Atherosclerotic heart disease of native coronary artery with other forms of angina pectoris: Secondary | ICD-10-CM | POA: Diagnosis not present

## 2021-11-24 DIAGNOSIS — A0471 Enterocolitis due to Clostridium difficile, recurrent: Secondary | ICD-10-CM

## 2021-11-24 DIAGNOSIS — I5042 Chronic combined systolic (congestive) and diastolic (congestive) heart failure: Secondary | ICD-10-CM

## 2021-11-24 NOTE — Patient Instructions (Addendum)
TAKE CARVEDILOL 12.5 MG TWICE A DAY. STOP CARVEDILOL 25 MG. TAKE OVER-THE-COUNTER PROBIOTIC DAILY.

## 2021-11-25 LAB — RENAL FUNCTION PANEL
Albumin: 4.2 g/dL (ref 3.6–4.6)
BUN/Creatinine Ratio: 23 (ref 12–28)
BUN: 34 mg/dL — ABNORMAL HIGH (ref 8–27)
CO2: 24 mmol/L (ref 20–29)
Calcium: 9.6 mg/dL (ref 8.7–10.3)
Chloride: 104 mmol/L (ref 96–106)
Creatinine, Ser: 1.45 mg/dL — ABNORMAL HIGH (ref 0.57–1.00)
Glucose: 105 mg/dL — ABNORMAL HIGH (ref 70–99)
Phosphorus: 3.6 mg/dL (ref 3.0–4.3)
Potassium: 5 mmol/L (ref 3.5–5.2)
Sodium: 143 mmol/L (ref 134–144)
eGFR: 34 mL/min/{1.73_m2} — ABNORMAL LOW (ref >59)

## 2021-12-02 ENCOUNTER — Other Ambulatory Visit: Payer: Self-pay | Admitting: Family Medicine

## 2021-12-02 NOTE — Telephone Encounter (Signed)
Requested Prescriptions  ?Pending Prescriptions Disp Refills  ?? albuterol (PROVENTIL) (2.5 MG/3ML) 0.083% nebulizer solution [Pharmacy Med Name: Albuterol Sulfate (2.5 MG/3ML) 0.083% Inhalation Nebulization Solution] 90 mL 0  ?  Sig: USE 1 VIAL IN NEBULIZER EVERY 4 HOURS AS NEEDED  ?  ? Pulmonology:  Beta Agonists 2 Failed - 12/02/2021  1:47 PM  ?  ?  Failed - Last BP in normal range  ?  BP Readings from Last 1 Encounters:  ?11/24/21 (!) 144/58  ?   ?  ?  Passed - Last Heart Rate in normal range  ?  Pulse Readings from Last 1 Encounters:  ?11/24/21 61  ?   ?  ?  Passed - Valid encounter within last 12 months  ?  Recent Outpatient Visits   ?      ? 1 week ago Recurrent Clostridioides difficile diarrhea  ? [facility] Jerrol Banana., MD  ? 3 weeks ago C. difficile diarrhea  ? [place], DO  ? 1 month ago Acute on chronic combined systolic and diastolic CHF (congestive heart failure) ([place])  ? [place], DO  ? 4 months ago Diarrhea, unspecified type  ? [facility] Jerrol Banana., MD  ? 5 months ago Acute on chronic combined systolic and diastolic CHF (congestive heart failure) ([place])  ? [facility] Jerrol Banana., MD  ?  ?  ?Future Appointments   ?        ? In 1 week Jerrol Banana., MD [facility]  ?  ? ?  ?  ?  ? ?

## 2021-12-09 ENCOUNTER — Telehealth: Payer: Self-pay | Admitting: Physician Assistant

## 2021-12-09 NOTE — Telephone Encounter (Signed)
[employer] sent letter stating that this patient was approved and is eligible to receive Entresto until 10/03/2022 at no cost to the patient. Letter filed in samples closet.  ?

## 2021-12-14 ENCOUNTER — Ambulatory Visit: Payer: Medicare Other | Admitting: Family Medicine

## 2021-12-15 ENCOUNTER — Encounter: Payer: Self-pay | Admitting: Family Medicine

## 2021-12-15 ENCOUNTER — Ambulatory Visit (INDEPENDENT_AMBULATORY_CARE_PROVIDER_SITE_OTHER): Payer: Medicare Other | Admitting: Family Medicine

## 2021-12-15 ENCOUNTER — Other Ambulatory Visit: Payer: Self-pay

## 2021-12-15 VITALS — BP 162/67 | HR 65 | Temp 98.2°F | Resp 18 | Ht 63.0 in | Wt 156.0 lb

## 2021-12-15 DIAGNOSIS — I10 Essential (primary) hypertension: Secondary | ICD-10-CM | POA: Diagnosis not present

## 2021-12-15 DIAGNOSIS — E039 Hypothyroidism, unspecified: Secondary | ICD-10-CM

## 2021-12-15 DIAGNOSIS — I25118 Atherosclerotic heart disease of native coronary artery with other forms of angina pectoris: Secondary | ICD-10-CM | POA: Diagnosis not present

## 2021-12-15 DIAGNOSIS — Z0000 Encounter for general adult medical examination without abnormal findings: Secondary | ICD-10-CM | POA: Diagnosis not present

## 2021-12-15 DIAGNOSIS — N1831 Chronic kidney disease, stage 3a: Secondary | ICD-10-CM

## 2021-12-15 DIAGNOSIS — I5042 Chronic combined systolic (congestive) and diastolic (congestive) heart failure: Secondary | ICD-10-CM

## 2021-12-15 DIAGNOSIS — E782 Mixed hyperlipidemia: Secondary | ICD-10-CM | POA: Diagnosis not present

## 2021-12-15 DIAGNOSIS — A0471 Enterocolitis due to Clostridium difficile, recurrent: Secondary | ICD-10-CM

## 2021-12-15 DIAGNOSIS — D500 Iron deficiency anemia secondary to blood loss (chronic): Secondary | ICD-10-CM | POA: Diagnosis not present

## 2021-12-15 DIAGNOSIS — A0472 Enterocolitis due to Clostridium difficile, not specified as recurrent: Secondary | ICD-10-CM

## 2021-12-15 NOTE — Progress Notes (Signed)
? ? ? ?Annual Wellness Visit ? ?  ?I,[name],acting as a scribe for [name], MD.,have documented all relevant documentation on the behalf of [name], MD,as directed by  [name], MD while in the presence of [name], MD. ? ? ?Patient: Stephanie Charles, Female    DOB: Mar 19, 1930, 88 y.o.   MRN: [id number] ?Visit Date: 12/15/2021 ? ?Today's Provider: [name], MD  ? ?Chief Complaint  ?Patient presents with  ? Medicare Wellness  ? ?Subjective  ?  ?Stephanie Charles is a 88 y.o. female who presents today for her Annual Wellness Visit.Annual Physical. ?She reports consuming a general diet. The patient does not participate in regular exercise at present. She generally feels fairly well. She reports sleeping fairly well. She does not have additional problems to discuss today.  ? ?HPI ?Bowels are improving ? ? ?Medications: ?Outpatient Medications Prior to Visit  ?Medication Sig  ? albuterol (PROVENTIL) (2.5 MG/3ML) 0.083% nebulizer solution USE 1 VIAL IN NEBULIZER EVERY 4 HOURS AS NEEDED  ? allopurinol (ZYLOPRIM) 300 MG tablet Take 1 tablet by mouth once daily (Patient taking differently: Take 300 mg by mouth daily.)  ? amitriptyline (ELAVIL) 10 MG tablet TAKE 1 TABLET BY MOUTH AT BEDTIME (Patient taking differently: Take 10 mg by mouth at bedtime.)  ? ASPIRIN LOW DOSE 81 MG EC tablet NEW PRESCRIPTION REQUEST: TAKE ONE TABLET BY MOUTH EVERY DAY (Patient taking differently: Take 81 mg by mouth daily.)  ? atorvastatin (LIPITOR) 40 MG tablet Take 1 tablet (40 mg total) by mouth daily.  ? carvedilol (COREG) 12.5 MG tablet Take 1 tablet (12.5 mg total) by mouth 2 (two) times daily.  ? clopidogrel (PLAVIX) 75 MG tablet Take 1 tablet (75 mg total) by mouth daily.  ? Ferrous Sulfate 27 MG TABS Take 27 mg by mouth at bedtime.  ? hydrALAZINE (APRESOLINE) 50 MG tablet Take 50 mg three times a day, For high blood pressure >150 Take extra hydralazine  ? hydrocortisone 2.5 % cream  Apply 1 application topically 2 (two) times daily as needed (for skin irritation).  ? isosorbide mononitrate (IMDUR) 30 MG 24 hr tablet Take 1 tablet (30 mg total) by mouth daily. For high blood pressure >150 can take extra isosorbide  ? levothyroxine (SYNTHROID) 137 MCG tablet Take 1 tablet by mouth once daily  ? Melatonin 10 MG TABS Take 20 mg by mouth at bedtime.  ? mirtazapine (REMERON) 30 MG tablet Take 1 tablet (30 mg total) by mouth at bedtime.  ? Multiple Vitamin (MULTIVITAMIN) capsule Take 1 capsule by mouth every evening.  ? pantoprazole (PROTONIX) 40 MG tablet Take 1 tablet by mouth once daily  ? potassium chloride (KLOR-CON) 10 MEQ tablet Take potassium 20 meq when you take torsemide  ? sacubitril-valsartan (ENTRESTO) 97-103 MG Take 1 tablet by mouth 2 (two) times daily.  ? sertraline (ZOLOFT) 25 MG tablet Take 1 tablet by mouth once daily  ? torsemide (DEMADEX) 20 MG tablet Take 1 tablet (20 mg total) by mouth daily as needed. For shortness of breath and For weight 163 or high with a potassium tab  ? ?No facility-administered medications prior to visit.  ?  ?Allergies  ?Allergen Reactions  ? Latex Itching  ? Levofloxacin   ?  Mouth sores  ? Lisinopril   ?  Cough?  ? Povidone-Iodine Other (See Comments)  ?  Severe blistering and itchiness. Severe redness  ? Simvastatin   ?  Myalgias  ?  Codeine Nausea And Vomiting  ? ? ?Patient Care Team: ?Jerrol Banana., MD as PCP - General (Family Medicine) ?[name], MD as PCP - Cardiology (Cardiology) ?Pa, [place] as Consulting Physician (Optometry) ?Germaine Pomfret, [facility] (Pharmacist) ? ?Review of Systems  ?All other systems reviewed and are negative. ? ?Last CBC ?Lab Results  ?Component Value Date  ? WBC 10.5 12/15/2021  ? HGB 12.2 12/15/2021  ? HCT 36.0 12/15/2021  ? MCV 95 12/15/2021  ? MCH 32.1 12/15/2021  ? RDW 13.6 12/15/2021  ? PLT 137 (L) 12/15/2021  ? ?  ?  ? Objective  ?  ?Vitals: BP (!) 162/67 (BP Location: Left Arm, Patient  Position: Sitting, Cuff Size: Normal)   Pulse 65   Temp 98.2 ?F (36.8 ?C) (Temporal)   Resp 18   Ht '5\' 3"'$  (1.6 m)   Wt 156 lb (70.8 kg)   SpO2 93%   BMI 27.63 kg/m?  ?BP Readings from Last 3 Encounters:  ?12/15/21 (!) 162/67  ?11/24/21 (!) 144/58  ?11/19/21 (!) 160/60  ? ?Wt Readings from Last 3 Encounters:  ?12/15/21 156 lb (70.8 kg)  ?11/24/21 158 lb 3.2 oz (71.8 kg)  ?11/19/21 158 lb 4 oz (71.8 kg)  ? ?  ? ? ?Physical Exam ?Vitals reviewed.  ?Constitutional:   ?   Appearance: Normal appearance.  ?HENT:  ?   Right Ear: External ear normal.  ?   Left Ear: External ear normal.  ?   Nose: Nose normal.  ?Eyes:  ?   General: No scleral icterus. ?   Conjunctiva/sclera: Conjunctivae normal.  ?Cardiovascular:  ?   Rate and Rhythm: Normal rate and regular rhythm.  ?   Heart sounds: Normal heart sounds.  ?Pulmonary:  ?   Effort: Pulmonary effort is normal.  ?   Breath sounds: Normal breath sounds.  ?Abdominal:  ?   Palpations: Abdomen is soft.  ?   Tenderness: There is no abdominal tenderness.  ?Lymphadenopathy:  ?   Cervical: No cervical adenopathy.  ?Skin: ?   General: Skin is warm and dry.  ?   Comments: Fair skin.  ?Neurological:  ?   General: No focal deficit present.  ?   Mental Status: She is alert and oriented to person, place, and time.  ?Psychiatric:     ?   Mood and Affect: Mood normal.     ?   Behavior: Behavior normal.     ?   Thought Content: Thought content normal.  ? ? ? ?Most recent functional status assessment: ?In your present state of health, do you have any difficulty performing the following activities: 10/26/2021  ?Hearing? Y  ?Vision? N  ?Difficulty concentrating or making decisions? N  ?Comment -  ?Walking or climbing stairs? N  ?Dressing or bathing? N  ?Doing errands, shopping? N  ?Some recent data might be hidden  ? ?Most recent fall risk assessment: ?Fall Risk  10/26/2021  ?Falls in the past year? 0  ?Number falls in past yr: 0  ?Injury with Fall? 0  ?Risk for fall due to : History of  fall(s);Impaired balance/gait  ?Follow up Falls evaluation completed  ? ? Most recent depression screenings: ?PHQ 2/9 Scores 10/26/2021 08/21/2020  ?PHQ - 2 Score 0 0  ?PHQ- 9 Score 3 2  ? ?Most recent cognitive screening: ?6CIT Screen 08/18/2020  ?What Year? 0 points  ?What month? 0 points  ?What time? 0 points  ?Count back from 20 0 points  ?  Months in reverse 4 points  ?Repeat phrase 2 points  ?Total Score 6  ? ?Most recent Audit-C alcohol use screening ?Alcohol Use Disorder Test (AUDIT) 10/26/2021  ?1. How often do you have a drink containing alcohol? 0  ?2. How many drinks containing alcohol do you have on a typical day when you are drinking? 0  ?3. How often do you have six or more drinks on one occasion? 0  ?AUDIT-C Score 0  ?Alcohol Brief Interventions/Follow-up -  ? ?A score of 3 or more in women, and 4 or more in men indicates increased risk for alcohol abuse, EXCEPT if all of the points are from question 1  ? ?No results found for any visits on 12/15/21. ? Assessment & Plan  ?  ? ?Annual wellness visit done today including the all of the following: ?Reviewed patient's Family Medical History ?Reviewed and updated list of patient's medical providers ?Assessment of cognitive impairment was done ?Assessed patient's functional ability ?Established a written schedule for health screening services ?Health Risk Assessent Completed and Reviewed ? ?Exercise Activities and Dietary recommendations ? Goals   ? ?  Increase Exercise    ?  Recommend to start the senior class that will be once weekly for 1 hour to help with balance and strength training.  ?  ?  Prevent falls   ?  Recommend to remove any items from the home that may cause slips or trips. ?  ? ?  ? ? ?Immunization History  ?Administered Date(s) Administered  ? Fluad Quad(high Dose 65+) 07/03/2019, 08/21/2020, 06/15/2021  ? Influenza, High Dose Seasonal PF 07/03/2015, 08/05/2016, 05/25/2017, 07/05/2018  ? PFIZER(Purple Top)SARS-COV-2 Vaccination 11/25/2019,  12/18/2019, 08/21/2020  ? Pneumococcal Conjugate-13 07/03/2015  ? Pneumococcal Polysaccharide-23 06/20/2013  ? Tdap 02/23/2012  ? Zoster Recombinat (Shingrix) 07/05/2018  ? Zoster, Live 08/03/2011  ? ? ?Health Maintenance

## 2021-12-16 LAB — CBC WITH DIFFERENTIAL/PLATELET
Basophils Absolute: 0.1 10*3/uL (ref 0.0–0.2)
Basos: 1 %
EOS (ABSOLUTE): 0.2 10*3/uL (ref 0.0–0.4)
Eos: 2 %
Hematocrit: 36 % (ref 34.0–46.6)
Hemoglobin: 12.2 g/dL (ref 11.1–15.9)
Immature Grans (Abs): 0 10*3/uL (ref 0.0–0.1)
Immature Granulocytes: 0 %
Lymphocytes Absolute: 1.9 10*3/uL (ref 0.7–3.1)
Lymphs: 18 %
MCH: 32.1 pg (ref 26.6–33.0)
MCHC: 33.9 g/dL (ref 31.5–35.7)
MCV: 95 fL (ref 79–97)
Monocytes Absolute: 0.6 10*3/uL (ref 0.1–0.9)
Monocytes: 5 %
Neutrophils Absolute: 7.8 10*3/uL — ABNORMAL HIGH (ref 1.4–7.0)
Neutrophils: 74 %
Platelets: 137 10*3/uL — ABNORMAL LOW (ref 150–450)
RBC: 3.8 x10E6/uL (ref 3.77–5.28)
RDW: 13.6 % (ref 11.7–15.4)
WBC: 10.5 10*3/uL (ref 3.4–10.8)

## 2021-12-16 LAB — LIPID PANEL
Chol/HDL Ratio: 4 ratio (ref 0.0–4.4)
Cholesterol, Total: 115 mg/dL (ref 100–199)
HDL: 29 mg/dL — ABNORMAL LOW (ref >39)
LDL Chol Calc (NIH): 53 mg/dL (ref 0–99)
Triglycerides: 201 mg/dL — ABNORMAL HIGH (ref 0–149)
VLDL Cholesterol Cal: 33 mg/dL (ref 5–40)

## 2021-12-16 LAB — COMPREHENSIVE METABOLIC PANEL
ALT: 12 IU/L (ref 0–32)
AST: 17 IU/L (ref 0–40)
Albumin/Globulin Ratio: 2.4 — ABNORMAL HIGH (ref 1.2–2.2)
Albumin: 4.3 g/dL (ref 3.6–4.6)
Alkaline Phosphatase: 100 IU/L (ref 44–121)
BUN/Creatinine Ratio: 15 (ref 12–28)
BUN: 18 mg/dL (ref 8–27)
Bilirubin Total: 0.4 mg/dL (ref 0.0–1.2)
CO2: 28 mmol/L (ref 20–29)
Calcium: 9.6 mg/dL (ref 8.7–10.3)
Chloride: 103 mmol/L (ref 96–106)
Creatinine, Ser: 1.19 mg/dL — ABNORMAL HIGH (ref 0.57–1.00)
Globulin, Total: 1.8 g/dL (ref 1.5–4.5)
Glucose: 122 mg/dL — ABNORMAL HIGH (ref 70–99)
Potassium: 4.9 mmol/L (ref 3.5–5.2)
Sodium: 142 mmol/L (ref 134–144)
Total Protein: 6.1 g/dL (ref 6.0–8.5)
eGFR: 44 mL/min/{1.73_m2} — ABNORMAL LOW (ref >59)

## 2021-12-16 LAB — TSH: TSH: 0.62 u[IU]/mL (ref 0.450–4.500)

## 2021-12-29 ENCOUNTER — Other Ambulatory Visit: Payer: Self-pay | Admitting: Family Medicine

## 2021-12-30 NOTE — Telephone Encounter (Signed)
Requested Prescriptions  ?Pending Prescriptions Disp Refills  ?? levothyroxine (SYNTHROID) 137 MCG tablet [Pharmacy Med Name: Levothyroxine Sodium 137 MCG Oral Tablet] 90 tablet 0  ?  Sig: Take 1 tablet by mouth once daily  ?  ? Endocrinology:  Hypothyroid Agents Passed - 12/29/2021 11:45 AM  ?  ?  Passed - TSH in normal range and within 360 days  ?  TSH  ?Date Value Ref Range Status  ?12/15/2021 0.620 0.450 - 4.500 uIU/mL Final  ?   ?  ?  Passed - Valid encounter within last 12 months  ?  Recent Outpatient Visits   ?      ? 2 weeks ago Encounter for [employer] annual wellness exam  ? [facility] Jerrol Banana., MD  ? 1 month ago Recurrent Clostridioides difficile diarrhea  ? [facility] Jerrol Banana., MD  ? 1 month ago C. difficile diarrhea  ? [place], DO  ? 2 months ago Acute on chronic combined systolic and diastolic CHF (congestive heart failure) ([place])  ? [place], DO  ? 5 months ago Diarrhea, unspecified type  ? [facility] Jerrol Banana., MD  ?  ?  ?Future Appointments   ?        ? In 3 months Jerrol Banana., MD [facility]  ?  ? ?  ?  ?  ? ? ?

## 2022-01-02 ENCOUNTER — Other Ambulatory Visit: Payer: Self-pay | Admitting: Family Medicine

## 2022-01-04 ENCOUNTER — Other Ambulatory Visit: Payer: Self-pay | Admitting: Family Medicine

## 2022-01-04 DIAGNOSIS — F3289 Other specified depressive episodes: Secondary | ICD-10-CM

## 2022-01-04 NOTE — Telephone Encounter (Signed)
Requested Prescriptions  ?Pending Prescriptions Disp Refills  ?? albuterol (PROVENTIL) (2.5 MG/3ML) 0.083% nebulizer solution [Pharmacy Med Name: Albuterol Sulfate (2.5 MG/3ML) 0.083% Inhalation Nebulization Solution] 75 mL 0  ?  Sig: USE 1 VIAL IN NEBULIZER EVERY 4 HOURS AS NEEDED  ?  ? Pulmonology:  Beta Agonists 2 Failed - 01/02/2022 12:29 PM  ?  ?  Failed - Last BP in normal range  ?  BP Readings from Last 1 Encounters:  ?12/15/21 (!) 162/67  ?   ?  ?  Passed - Last Heart Rate in normal range  ?  Pulse Readings from Last 1 Encounters:  ?12/15/21 65  ?   ?  ?  Passed - Valid encounter within last 12 months  ?  Recent Outpatient Visits   ?      ? 2 weeks ago Encounter for [employer] annual wellness exam  ? [facility] Jerrol Banana., MD  ? 1 month ago Recurrent Clostridioides difficile diarrhea  ? [facility] Jerrol Banana., MD  ? 1 month ago C. difficile diarrhea  ? [place], DO  ? 2 months ago Acute on chronic combined systolic and diastolic CHF (congestive heart failure) ([place])  ? [place], DO  ? 5 months ago Diarrhea, unspecified type  ? [facility] Jerrol Banana., MD  ?  ?  ?Future Appointments   ?        ? In 3 months Jerrol Banana., MD [facility]  ?  ? ?  ?  ?  ? ?

## 2022-01-05 NOTE — Telephone Encounter (Signed)
Requested medications are due for refill today.  yes ? ?Requested medications are on the active medications list.  yes ? ?Last refill. 10/13/2021 #90 0 refills ? ?Future visit scheduled.   yes ? ?Notes to clinic.  Medication refill is not delegated. ? ? ? ?Requested Prescriptions  ?Pending Prescriptions Disp Refills  ? sertraline (ZOLOFT) 25 MG tablet [Pharmacy Med Name: Sertraline HCl 25 MG Oral Tablet] 90 tablet 0  ?  Sig: Take 1 tablet by mouth once daily  ?  ? Not Delegated - Psychiatry:  Antidepressants - SSRI - sertraline Failed - 01/04/2022  1:15 PM  ?  ?  Failed - This refill cannot be delegated  ?  ?  Passed - AST in normal range and within 360 days  ?  AST  ?Date Value Ref Range Status  ?12/15/2021 17 0 - 40 IU/L Final  ? ?SGOT(AST)  ?Date Value Ref Range Status  ?04/26/2014 22 15 - 37 Unit/L Final  ?  ?  ?  ?  Passed - ALT in normal range and within 360 days  ?  ALT  ?Date Value Ref Range Status  ?12/15/2021 12 0 - 32 IU/L Final  ? ?SGPT (ALT)  ?Date Value Ref Range Status  ?04/26/2014 29 U/L Final  ?  Comment:  ?  14-63 ?NOTE: New Reference Range ?04/23/14 ?  ?  ?  ?  ?  Passed - Completed PHQ-2 or PHQ-9 in the last 360 days  ?  ?  Passed - Valid encounter within last 6 months  ?  Recent Outpatient Visits   ? ?      ? 3 weeks ago Encounter for [employer] annual wellness exam  ? [facility] Jerrol Banana., MD  ? 1 month ago Recurrent Clostridioides difficile diarrhea  ? [facility] Jerrol Banana., MD  ? 1 month ago C. difficile diarrhea  ? [place], DO  ? 2 months ago Acute on chronic combined systolic and diastolic CHF (congestive heart failure) ([place])  ? [place], DO  ? 5 months ago Diarrhea, unspecified type  ? [facility] Jerrol Banana., MD  ? ?  ?  ?Future Appointments   ? ?        ? In 3 months Jerrol Banana., MD [facility]  ? ?  ? ?  ?   ?  ?  ?

## 2022-01-11 ENCOUNTER — Other Ambulatory Visit: Payer: Self-pay | Admitting: Family Medicine

## 2022-01-11 DIAGNOSIS — K219 Gastro-esophageal reflux disease without esophagitis: Secondary | ICD-10-CM

## 2022-01-12 NOTE — Telephone Encounter (Signed)
Requested Prescriptions  ?Pending Prescriptions Disp Refills  ?? pantoprazole (PROTONIX) 40 MG tablet [Pharmacy Med Name: Pantoprazole Sodium 40 MG Oral Tablet Delayed Release] 90 tablet 0  ?  Sig: Take 1 tablet by mouth once daily  ?  ? Gastroenterology: Proton Pump Inhibitors Passed - 01/11/2022 12:03 PM  ?  ?  Passed - Valid encounter within last 12 months  ?  Recent Outpatient Visits   ?      ? 4 weeks ago Encounter for [employer] annual wellness exam  ? [facility] Jerrol Banana., MD  ? 1 month ago Recurrent Clostridioides difficile diarrhea  ? [facility] Jerrol Banana., MD  ? 2 months ago C. difficile diarrhea  ? [place], DO  ? 2 months ago Acute on chronic combined systolic and diastolic CHF (congestive heart failure) ([place])  ? [place], DO  ? 5 months ago Diarrhea, unspecified type  ? [facility] Jerrol Banana., MD  ?  ?  ?Future Appointments   ?        ? In 3 months Jerrol Banana., MD [facility]  ?  ? ?  ?  ?  ? ?

## 2022-01-13 ENCOUNTER — Ambulatory Visit: Payer: Medicare Other | Admitting: Family Medicine

## 2022-01-14 ENCOUNTER — Encounter: Payer: Self-pay | Admitting: Gastroenterology

## 2022-01-14 ENCOUNTER — Ambulatory Visit (INDEPENDENT_AMBULATORY_CARE_PROVIDER_SITE_OTHER): Payer: Medicare Other | Admitting: Gastroenterology

## 2022-01-14 VITALS — BP 190/93 | HR 80 | Temp 97.6°F | Ht 62.0 in | Wt 158.0 lb

## 2022-01-14 DIAGNOSIS — K580 Irritable bowel syndrome with diarrhea: Secondary | ICD-10-CM | POA: Diagnosis not present

## 2022-01-14 NOTE — Progress Notes (Signed)
? ? ?Primary Care Physician: Jerrol Banana., MD ? ?Primary Gastroenterologist:  Dr. Lucilla Lame ? ?Chief Complaint  ?Patient presents with  ? New Patient (Initial Visit)  ? ? ?HPI: Stephanie Charles is a 88 y.o. female here after being seen in the past by Dr. Bonna Gains and Dr. Vicente Males.  The patient had been seen in the past for iron deficiency anemia and now comes in today with diarrhea presumed from C. difficile colitis.  On February 1 the patient was found to have C. difficile toxin positive.  On February 7 the patient was seen by her primary care provider and was reported to have been hospitalized in December for near syncope with diarrhea and started on oral vancomycin for C. difficile colitis.  She was then switched to Dificid.  The patient's diarrhea had improved but was reporting continued weakness dizziness and lightheadedness at her appointment at that time. ?The patient now comes in today with his daughter and states that she is only having some loose stools sometimes and was concerned because she has been passing mucus.  She did have a large amount of mucus past which she states were follow-up a Palm of her hands to express the size of the mucus but after that it has diminished greatly.  She has not woken up by the loose bowel movements and denies any abdominal pain fevers chills nausea or vomiting. ? ?Past Medical History:  ?Diagnosis Date  ? Anemia   ? CHF (congestive heart failure) ([place]) 10/05/2021  ? Diverticulitis   ? GERD (gastroesophageal reflux disease)   ? Hyperlipidemia   ? Hypertension   ? Hypothyroidism   ? Myocardial infarction [facility]) 08/2017  ? TIA (transient ischemic attack)   ? 1 approx 2015, 1 approx 2018  ? Vertigo   ? last episode several months ago  ? [name]   ? ? ?Current Outpatient Medications  ?Medication Sig Dispense Refill  ? albuterol (PROVENTIL) (2.5 MG/3ML) 0.083% nebulizer solution USE 1 VIAL IN NEBULIZER EVERY 4 HOURS AS NEEDED 180 mL 6  ? allopurinol  (ZYLOPRIM) 300 MG tablet Take 1 tablet by mouth once daily 90 tablet 3  ? amitriptyline (ELAVIL) 10 MG tablet TAKE 1 TABLET BY MOUTH AT BEDTIME 90 tablet 3  ? ASPIRIN LOW DOSE 81 MG EC tablet NEW PRESCRIPTION REQUEST: TAKE ONE TABLET BY MOUTH EVERY DAY (Patient taking differently: Take 81 mg by mouth daily.) 90 tablet 3  ? atorvastatin (LIPITOR) 40 MG tablet Take 1 tablet (40 mg total) by mouth daily. 90 tablet 3  ? carvedilol (COREG) 12.5 MG tablet Take 1 tablet (12.5 mg total) by mouth 2 (two) times daily. 180 tablet 3  ? clopidogrel (PLAVIX) 75 MG tablet Take 1 tablet (75 mg total) by mouth daily. 90 tablet 3  ? Ferrous Sulfate 27 MG TABS Take 27 mg by mouth at bedtime.    ? hydrALAZINE (APRESOLINE) 50 MG tablet Take 50 mg three times a day, For high blood pressure >150 Take extra hydralazine 300 tablet 3  ? hydrocortisone 2.5 % cream Apply 1 application topically 2 (two) times daily as needed (for skin irritation).    ? isosorbide mononitrate (IMDUR) 30 MG 24 hr tablet Take 1 tablet (30 mg total) by mouth daily. For high blood pressure >150 can take extra isosorbide 100 tablet 3  ? levothyroxine (SYNTHROID) 137 MCG tablet Take 1 tablet by mouth once daily 90 tablet 0  ? Melatonin 10 MG TABS Take  20 mg by mouth at bedtime.    ? mirtazapine (REMERON) 30 MG tablet Take 1 tablet (30 mg total) by mouth at bedtime. 90 tablet 0  ? Multiple Vitamin (MULTIVITAMIN) capsule Take 1 capsule by mouth every evening.    ? pantoprazole (PROTONIX) 40 MG tablet Take 1 tablet by mouth once daily 90 tablet 0  ? potassium chloride (KLOR-CON) 10 MEQ tablet Take potassium 20 meq when you take torsemide 90 tablet 3  ? sacubitril-valsartan (ENTRESTO) 97-103 MG Take 1 tablet by mouth 2 (two) times daily. 180 tablet 3  ? sertraline (ZOLOFT) 25 MG tablet Take 1 tablet by mouth once daily 90 tablet 0  ? torsemide (DEMADEX) 20 MG tablet Take 1 tablet (20 mg total) by mouth daily as needed. For shortness of breath and For weight 163 or high  with a potassium tab 90 tablet 3  ? ?No current facility-administered medications for this visit.  ? ? ?Allergies as of 01/14/2022 - Review Complete 01/14/2022  ?Allergen Reaction Noted  ? Latex Itching 06/08/2019  ? Levofloxacin  03/11/2015  ? Lisinopril  06/14/2017  ? Povidone-iodine Other (See Comments) 09/22/2017  ? Simvastatin  03/11/2015  ? Codeine Nausea And Vomiting 04/08/2014  ? ? ?ROS: ? ?General: Negative for anorexia, weight loss, fever, chills, fatigue, weakness. ?ENT: Negative for hoarseness, difficulty swallowing , nasal congestion. ?CV: Negative for chest pain, angina, palpitations, dyspnea on exertion, peripheral edema.  ?Respiratory: Negative for dyspnea at rest, dyspnea on exertion, cough, sputum, wheezing.  ?GI: See history of present illness. ?GU:  Negative for dysuria, hematuria, urinary incontinence, urinary frequency, nocturnal urination.  ?Endo: Negative for unusual weight change.  ?  ?Physical Examination: ? ? BP (!) 190/93   Pulse 80   Temp 97.6 ?F (36.4 ?C) (Oral)   Ht '5\' 2"'$  (1.575 m)   Wt 158 lb (71.7 kg)   BMI 28.90 kg/m?  ? ?General: Well-nourished, well-developed in no acute distress.  ?Eyes: No icterus. Conjunctivae pink. ?Neuro: Alert and oriented x 3.  Grossly intact. ?Skin: Warm and dry, no jaundice.   ?Psych: Alert and cooperative, normal mood and affect. ? ?Labs:  ?  ?Imaging Studies: ?No results found. ? ?Assessment and Plan:  ? ?Stephanie Charles is a 89 y.o. y/o female who comes in today with a history of C. Difficile colitis.  The patient C. Difficile appears to have resolved.  She is not having any positive the present time.  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 She did not have any symptoms at the present time to indicate that she is having recurrent C. Difficile.  The patient will follow up as needed.  The patient has been exposed the plan and agrees with it. ? ? ? ? ?Lucilla Lame, MD.  [name] ? ? ? Note: This dictation was prepared with Dragon dictation along with smaller phrase technology. Any transcriptional errors that result from this process are unintentional.  ?

## 2022-01-18 ENCOUNTER — Other Ambulatory Visit: Payer: Self-pay | Admitting: Family Medicine

## 2022-01-18 DIAGNOSIS — F3341 Major depressive disorder, recurrent, in partial remission: Secondary | ICD-10-CM

## 2022-01-19 NOTE — Telephone Encounter (Signed)
Requested Prescriptions  ?Pending Prescriptions Disp Refills  ?? mirtazapine (REMERON) 30 MG tablet [Pharmacy Med Name: Mirtazapine 30 MG Oral Tablet] 90 tablet 0  ?  Sig: TAKE 1 TABLET BY MOUTH AT BEDTIME  ?  ? Psychiatry: Antidepressants - mirtazapine Passed - 01/18/2022 10:20 AM  ?  ?  Passed - Completed PHQ-2 or PHQ-9 in the last 360 days  ?  ?  Passed - Valid encounter within last 6 months  ?  Recent Outpatient Visits   ?      ? 1 month ago Encounter for [employer] annual wellness exam  ? [facility] Jerrol Banana., MD  ? 1 month ago Recurrent Clostridioides difficile diarrhea  ? [facility] Jerrol Banana., MD  ? 2 months ago C. difficile diarrhea  ? [place], DO  ? 2 months ago Acute on chronic combined systolic and diastolic CHF (congestive heart failure) ([place])  ? [place], DO  ? 6 months ago Diarrhea, unspecified type  ? [facility] Jerrol Banana., MD  ?  ?  ?Future Appointments   ?        ? In 3 months Jerrol Banana., MD [facility]  ?  ? ?  ?  ?  ? ? ?

## 2022-02-15 ENCOUNTER — Telehealth: Payer: Self-pay | Admitting: Gastroenterology

## 2022-02-15 DIAGNOSIS — R197 Diarrhea, unspecified: Secondary | ICD-10-CM | POA: Diagnosis not present

## 2022-02-15 DIAGNOSIS — E785 Hyperlipidemia, unspecified: Secondary | ICD-10-CM | POA: Diagnosis not present

## 2022-02-15 DIAGNOSIS — I5042 Chronic combined systolic (congestive) and diastolic (congestive) heart failure: Secondary | ICD-10-CM | POA: Diagnosis not present

## 2022-02-15 DIAGNOSIS — I252 Old myocardial infarction: Secondary | ICD-10-CM | POA: Diagnosis not present

## 2022-02-15 DIAGNOSIS — K5730 Diverticulosis of large intestine without perforation or abscess without bleeding: Secondary | ICD-10-CM | POA: Diagnosis not present

## 2022-02-15 DIAGNOSIS — R1084 Generalized abdominal pain: Secondary | ICD-10-CM | POA: Diagnosis not present

## 2022-02-15 DIAGNOSIS — D72829 Elevated white blood cell count, unspecified: Secondary | ICD-10-CM | POA: Diagnosis not present

## 2022-02-15 DIAGNOSIS — Z7902 Long term (current) use of antithrombotics/antiplatelets: Secondary | ICD-10-CM | POA: Diagnosis not present

## 2022-02-15 DIAGNOSIS — K219 Gastro-esophageal reflux disease without esophagitis: Secondary | ICD-10-CM | POA: Diagnosis not present

## 2022-02-15 DIAGNOSIS — Z8719 Personal history of other diseases of the digestive system: Secondary | ICD-10-CM | POA: Diagnosis not present

## 2022-02-15 DIAGNOSIS — Z9861 Coronary angioplasty status: Secondary | ICD-10-CM | POA: Diagnosis not present

## 2022-02-15 DIAGNOSIS — I2510 Atherosclerotic heart disease of native coronary artery without angina pectoris: Secondary | ICD-10-CM | POA: Diagnosis not present

## 2022-02-15 DIAGNOSIS — E860 Dehydration: Secondary | ICD-10-CM | POA: Diagnosis not present

## 2022-02-15 DIAGNOSIS — Z79899 Other long term (current) drug therapy: Secondary | ICD-10-CM | POA: Diagnosis not present

## 2022-02-15 DIAGNOSIS — N179 Acute kidney failure, unspecified: Secondary | ICD-10-CM | POA: Diagnosis not present

## 2022-02-15 DIAGNOSIS — G4700 Insomnia, unspecified: Secondary | ICD-10-CM | POA: Diagnosis not present

## 2022-02-15 DIAGNOSIS — Z7982 Long term (current) use of aspirin: Secondary | ICD-10-CM | POA: Diagnosis not present

## 2022-02-15 DIAGNOSIS — Z792 Long term (current) use of antibiotics: Secondary | ICD-10-CM | POA: Diagnosis not present

## 2022-02-15 DIAGNOSIS — Z20822 Contact with and (suspected) exposure to covid-19: Secondary | ICD-10-CM | POA: Diagnosis not present

## 2022-02-15 DIAGNOSIS — K5792 Diverticulitis of intestine, part unspecified, without perforation or abscess without bleeding: Secondary | ICD-10-CM | POA: Diagnosis not present

## 2022-02-15 DIAGNOSIS — R109 Unspecified abdominal pain: Secondary | ICD-10-CM | POA: Diagnosis not present

## 2022-02-15 DIAGNOSIS — I255 Ischemic cardiomyopathy: Secondary | ICD-10-CM | POA: Diagnosis not present

## 2022-02-15 DIAGNOSIS — Z8673 Personal history of transient ischemic attack (TIA), and cerebral infarction without residual deficits: Secondary | ICD-10-CM | POA: Diagnosis not present

## 2022-02-15 DIAGNOSIS — E039 Hypothyroidism, unspecified: Secondary | ICD-10-CM | POA: Diagnosis not present

## 2022-02-15 DIAGNOSIS — N189 Chronic kidney disease, unspecified: Secondary | ICD-10-CM | POA: Diagnosis not present

## 2022-02-15 DIAGNOSIS — R5381 Other malaise: Secondary | ICD-10-CM | POA: Diagnosis not present

## 2022-02-15 DIAGNOSIS — I130 Hypertensive heart and chronic kidney disease with heart failure and stage 1 through stage 4 chronic kidney disease, or unspecified chronic kidney disease: Secondary | ICD-10-CM | POA: Diagnosis not present

## 2022-02-15 DIAGNOSIS — D649 Anemia, unspecified: Secondary | ICD-10-CM | POA: Diagnosis not present

## 2022-02-15 NOTE — Telephone Encounter (Signed)
I spoke with pt and she stated that she understood and will have to see if her daughter can take her as she no longer drives.... I told pt I would check in with her to see where she would be going as she sated that she did not want to go to [facility] ?

## 2022-02-15 NOTE — Telephone Encounter (Signed)
Patient states that Dr Allen Norris told her to contact him if she has any problems. Patient states that she has abdominal pain, cramps, cannot control bowel movements, black stool, cannot eat and has not eaten since Friday 02/12/2022. Patient states she is very weak and needs some medical advice.  ?

## 2022-02-16 ENCOUNTER — Encounter (INDEPENDENT_AMBULATORY_CARE_PROVIDER_SITE_OTHER): Payer: Self-pay | Admitting: Vascular Surgery

## 2022-02-16 DIAGNOSIS — N179 Acute kidney failure, unspecified: Secondary | ICD-10-CM | POA: Insufficient documentation

## 2022-02-16 DIAGNOSIS — N184 Chronic kidney disease, stage 4 (severe): Secondary | ICD-10-CM | POA: Insufficient documentation

## 2022-02-16 DIAGNOSIS — R197 Diarrhea, unspecified: Secondary | ICD-10-CM | POA: Insufficient documentation

## 2022-02-19 ENCOUNTER — Telehealth: Payer: Self-pay

## 2022-02-19 DIAGNOSIS — M179 Osteoarthritis of knee, unspecified: Secondary | ICD-10-CM | POA: Insufficient documentation

## 2022-02-19 DIAGNOSIS — M25569 Pain in unspecified knee: Secondary | ICD-10-CM | POA: Insufficient documentation

## 2022-02-19 DIAGNOSIS — M1991 Primary osteoarthritis, unspecified site: Secondary | ICD-10-CM | POA: Insufficient documentation

## 2022-02-19 NOTE — Telephone Encounter (Signed)
Transition Care Management Follow-up Telephone Call Date of discharge and from where: 02/18/22 [facility] How have you been since you were released from the hospital? Pt states she doing pretty good Any questions or concerns? No  Items Reviewed: Did the pt receive and understand the discharge instructions provided? Yes  Medications obtained and verified? Yes  Other? No  Any new allergies since your discharge? No  Dietary orders reviewed? Yes Do you have support at home? Yes   Home Care and Equipment/Supplies: Were home health services ordered? no  Were any new equipment or medical supplies ordered?  No   Functional Questionnaire: (I = Independent and D = Dependent) ADLs: I  Bathing/Dressing- I  Meal Prep- I  Eating- I  Maintaining continence- I  Transferring/Ambulation- I  Managing Meds- I  Follow up appointments reviewed:  PCP Hospital f/u appt confirmed? Yes  Scheduled to see Dr. Rosanna Randy on 02/23/22 @ 1:40. [facility] f/u appt confirmed? Yes  Scheduled to see [facility] gastroenterology June 2023. Are transportation arrangements needed? No  If their condition worsens, is the pt aware to call PCP or go to the Emergency Dept.? Yes Was the patient provided with contact information for the PCP's office or ED? Yes Was to pt encouraged to call back with questions or concerns? Yes

## 2022-02-23 ENCOUNTER — Ambulatory Visit (INDEPENDENT_AMBULATORY_CARE_PROVIDER_SITE_OTHER): Payer: Medicare Other | Admitting: Family Medicine

## 2022-02-23 ENCOUNTER — Encounter: Payer: Self-pay | Admitting: Family Medicine

## 2022-02-23 VITALS — BP 145/55 | HR 64 | Temp 97.8°F | Resp 16 | Ht 63.0 in | Wt 153.0 lb

## 2022-02-23 DIAGNOSIS — E860 Dehydration: Secondary | ICD-10-CM | POA: Diagnosis not present

## 2022-02-23 DIAGNOSIS — N179 Acute kidney failure, unspecified: Secondary | ICD-10-CM | POA: Diagnosis not present

## 2022-02-23 DIAGNOSIS — I25118 Atherosclerotic heart disease of native coronary artery with other forms of angina pectoris: Secondary | ICD-10-CM

## 2022-02-23 DIAGNOSIS — N1831 Chronic kidney disease, stage 3a: Secondary | ICD-10-CM

## 2022-02-23 DIAGNOSIS — A0472 Enterocolitis due to Clostridium difficile, not specified as recurrent: Secondary | ICD-10-CM | POA: Diagnosis not present

## 2022-02-23 MED ORDER — FLUTICASONE PROPIONATE 50 MCG/ACT NA SUSP: 2.0000 | Freq: Every day | NASAL | 6 refills | Status: DC

## 2022-02-23 NOTE — Progress Notes (Unsigned)
Established patient visit  I,Stephanie Charles,acting as a scribe for Stephanie Durie, MD.,have documented all relevant documentation on the behalf of Stephanie Durie, MD,as directed by  Stephanie Durie, MD while in the presence of Stephanie Durie, MD.   Patient: Stephanie Charles   DOB: 1930-09-24   88 y.o. Female  MRN: [id number] Visit Date: 02/23/2022  Today's healthcare provider: [name], MD   Chief Complaint  Patient presents with   Hospitalization Follow-up   Subjective    HPI  This is a transition of care visit for diarrhea and dehydration and another bout of C. difficile colitis.  She improved immediately with antibiotic therapy specific,.  She went home on Dificid and vancomycin She has follow-up arranged with [facility].  She is feeling much better. Follow up Hospitalization  Patient was admitted to [facility] on 02/15/2022 and discharged on 02/19/2022. She was treated for Dehydration (Primary Dx); Diarrhea, unspecified type; Recurrent Clostridioides difficile diarrhea. Treatment for this included; see notes in chart. Telephone follow up was done on 02/19/2022 She reports good compliance with treatment. She reports this condition is improved.  ----------------------------------------------------------------------------------------- -   Medications: Outpatient Medications Prior to Visit  Medication Sig   albuterol (PROVENTIL) (2.5 MG/3ML) 0.083% nebulizer solution USE 1 VIAL IN NEBULIZER EVERY 4 HOURS AS NEEDED   allopurinol (ZYLOPRIM) 300 MG tablet Take 1 tablet by mouth once daily   amitriptyline (ELAVIL) 10 MG tablet TAKE 1 TABLET BY MOUTH AT BEDTIME   ASPIRIN LOW DOSE 81 MG EC tablet NEW PRESCRIPTION REQUEST: TAKE ONE TABLET BY MOUTH EVERY DAY (Patient taking differently: Take 81 mg by mouth daily.)   atorvastatin (LIPITOR) 40 MG tablet Take 1 tablet (40 mg total) by mouth daily.   carvedilol (COREG) 12.5 MG tablet Take 1 tablet (12.5 mg total) by mouth  2 (two) times daily.   clopidogrel (PLAVIX) 75 MG tablet Take 1 tablet (75 mg total) by mouth daily.   Ferrous Sulfate 27 MG TABS Take 27 mg by mouth at bedtime.   fidaxomicin (DIFICID) 200 MG TABS tablet Take by mouth. Take 1 tablet (200 mg total) by mouth Two (2) times a day.   hydrALAZINE (APRESOLINE) 50 MG tablet Take 50 mg three times a day, For high blood pressure >150 Take extra hydralazine   hydrocortisone 2.5 % cream Apply 1 application topically 2 (two) times daily as needed (for skin irritation).   isosorbide mononitrate (IMDUR) 30 MG 24 hr tablet Take 1 tablet (30 mg total) by mouth daily. For high blood pressure >150 can take extra isosorbide   levothyroxine (SYNTHROID) 137 MCG tablet Take 1 tablet by mouth once daily   Melatonin 10 MG TABS Take 20 mg by mouth at bedtime.   mirtazapine (REMERON) 30 MG tablet TAKE 1 TABLET BY MOUTH AT BEDTIME   Multiple Vitamin (MULTIVITAMIN) capsule Take 1 capsule by mouth every evening.   pantoprazole (PROTONIX) 40 MG tablet Take 1 tablet by mouth once daily   potassium chloride (KLOR-CON) 10 MEQ tablet Take potassium 20 meq when you take torsemide   sacubitril-valsartan (ENTRESTO) 97-103 MG Take 1 tablet by mouth 2 (two) times daily.   sertraline (ZOLOFT) 25 MG tablet Take 1 tablet by mouth once daily   torsemide (DEMADEX) 20 MG tablet Take 1 tablet (20 mg total) by mouth daily as needed. For shortness of breath and For weight 163 or high with a potassium tab   [START ON 02/26/2022] vancomycin (VANCOCIN) 125 MG  capsule Take by mouth. Take 1 capsule (125 mg total) by mouth two (2) times a day for 7 days, THEN 1 capsule (125 mg total) daily for 7 days, THEN 1 capsule (125 mg total) every other day for 7 days, THEN 1 capsule (125 mg total) every third day for 21 days. Start after finishing the fidaxomicin (dificid).   No facility-administered medications prior to visit.    Review of Systems  Constitutional:  Negative for appetite change, chills,  fatigue and fever.  Respiratory:  Negative for chest tightness and shortness of breath.   Cardiovascular:  Negative for chest pain and palpitations.  Gastrointestinal:  Negative for abdominal pain, nausea and vomiting.  Neurological:  Negative for dizziness and weakness.   Last CBC Lab Results  Component Value Date   WBC 9.7 02/23/2022   HGB 10.3 (L) 02/23/2022   HCT 31.9 (L) 02/23/2022   MCV 95 02/23/2022   MCH 30.7 02/23/2022   RDW 14.0 02/23/2022   PLT 166 02/23/2022       Objective    BP (!) 145/55 (BP Location: Right Arm, Patient Position: Sitting, Cuff Size: Normal)   Pulse 64   Temp 97.8 F (36.6 C) (Temporal)   Resp 16   Ht '5\' 3"'$  (1.6 m)   Wt 153 lb (69.4 kg)   SpO2 95%   BMI 27.10 kg/m  BP Readings from Last 3 Encounters:  02/23/22 (!) 145/55  01/14/22 (!) 190/93  12/15/21 (!) 162/67   Wt Readings from Last 3 Encounters:  02/23/22 153 lb (69.4 kg)  01/14/22 158 lb (71.7 kg)  12/15/21 156 lb (70.8 kg)      Physical Exam Vitals reviewed.  Constitutional:      Appearance: Normal appearance.  HENT:     Right Ear: External ear normal.     Left Ear: External ear normal.     Nose: Nose normal.  Eyes:     General: No scleral icterus.    Conjunctiva/sclera: Conjunctivae normal.  Cardiovascular:     Rate and Rhythm: Normal rate and regular rhythm.     Heart sounds: Normal heart sounds.  Pulmonary:     Effort: Pulmonary effort is normal.     Breath sounds: Normal breath sounds.  Abdominal:     Palpations: Abdomen is soft.     Tenderness: There is no abdominal tenderness.  Lymphadenopathy:     Cervical: No cervical adenopathy.  Skin:    General: Skin is warm and dry.     Comments: Fair skin.  Neurological:     General: No focal deficit present.     Mental Status: She is alert and oriented to person, place, and time.  Psychiatric:        Mood and Affect: Mood normal.        Behavior: Behavior normal.        Thought Content: Thought content normal.       No results found for any visits on 02/23/22.  Assessment & Plan     1. C. difficile diarrhea Has follow-up with [facility] to see how much of this is from C. difficile. - CBC w/Diff/Platelet  2. Dehydration Stay hydrated. - CBC w/Diff/Platelet - Comprehensive Metabolic Panel (CMET)  3. AKI (acute kidney injury) ([place]) Follow-up lab data although this was improving for she got out of the hospital. - Comprehensive Metabolic Panel (CMET)  4. Stage 3a chronic kidney disease ([place]) All risk factors treated Discussed blood pressure today with patient and her daughter.  Probably have to tolerate a systolic in the 1 17-793 range with diastolic of 55 today 5. Coronary artery disease of native artery of native heart with stable angina pectoris ([place]) All risk factors treated   No follow-ups on file.      I, Stephanie Durie, MD, have reviewed all documentation for this visit. The documentation on 02/24/22 for the exam, diagnosis, procedures, and orders are all accurate and complete.    Filippo Puls Cranford Mon, MD  [facility] [phone] (phone) [phone] (fax)  [place]

## 2022-02-23 NOTE — Patient Instructions (Signed)
Try using a cane daily

## 2022-02-24 LAB — CBC WITH DIFFERENTIAL/PLATELET
Basophils Absolute: 0.1 10*3/uL (ref 0.0–0.2)
Basos: 1 %
EOS (ABSOLUTE): 0.2 10*3/uL (ref 0.0–0.4)
Eos: 2 %
Hematocrit: 31.9 % — ABNORMAL LOW (ref 34.0–46.6)
Hemoglobin: 10.3 g/dL — ABNORMAL LOW (ref 11.1–15.9)
Immature Grans (Abs): 0.2 10*3/uL — ABNORMAL HIGH (ref 0.0–0.1)
Immature Granulocytes: 2 %
Lymphocytes Absolute: 1.6 10*3/uL (ref 0.7–3.1)
Lymphs: 16 %
MCH: 30.7 pg (ref 26.6–33.0)
MCHC: 32.3 g/dL (ref 31.5–35.7)
MCV: 95 fL (ref 79–97)
Monocytes Absolute: 0.5 10*3/uL (ref 0.1–0.9)
Monocytes: 5 %
Neutrophils Absolute: 7.2 10*3/uL — ABNORMAL HIGH (ref 1.4–7.0)
Neutrophils: 74 %
Platelets: 166 10*3/uL (ref 150–450)
RBC: 3.36 x10E6/uL — ABNORMAL LOW (ref 3.77–5.28)
RDW: 14 % (ref 11.7–15.4)
WBC: 9.7 10*3/uL (ref 3.4–10.8)

## 2022-02-24 LAB — COMPREHENSIVE METABOLIC PANEL
ALT: 17 IU/L (ref 0–32)
AST: 17 IU/L (ref 0–40)
Albumin/Globulin Ratio: 1.7 (ref 1.2–2.2)
Albumin: 3.6 g/dL (ref 3.6–4.6)
Alkaline Phosphatase: 102 IU/L (ref 44–121)
BUN/Creatinine Ratio: 12 (ref 12–28)
BUN: 13 mg/dL (ref 8–27)
Bilirubin Total: 0.3 mg/dL (ref 0.0–1.2)
CO2: 25 mmol/L (ref 20–29)
Calcium: 9.1 mg/dL (ref 8.7–10.3)
Chloride: 108 mmol/L — ABNORMAL HIGH (ref 96–106)
Creatinine, Ser: 1.12 mg/dL — ABNORMAL HIGH (ref 0.57–1.00)
Globulin, Total: 2.1 g/dL (ref 1.5–4.5)
Glucose: 106 mg/dL — ABNORMAL HIGH (ref 70–99)
Potassium: 5.1 mmol/L (ref 3.5–5.2)
Sodium: 145 mmol/L — ABNORMAL HIGH (ref 134–144)
Total Protein: 5.7 g/dL — ABNORMAL LOW (ref 6.0–8.5)
eGFR: 47 mL/min/{1.73_m2} — ABNORMAL LOW (ref >59)

## 2022-04-05 ENCOUNTER — Other Ambulatory Visit: Payer: Self-pay

## 2022-04-05 NOTE — Telephone Encounter (Signed)
Called patient to see if she requested a refill of Nitroglycerin, in which prescription hasn't been written since 2020. Patient states that she did request the medication and asked if it was needed. I inform the patient that I could send the request to the nurse and I also informed the patient that she has an upcoming appointment with Dr. Rockey Situ on 04/09/2022. The patient told me to disregard the request as she would speak to the provider about the refill.

## 2022-04-08 NOTE — Progress Notes (Signed)
Cardiology Office Note  Date:  04/09/2022   ID:  DAZJA HOUCHIN, DOB [birth year], MRN [id number]  PCP:  Jerrol Banana., MD   Chief Complaint  Patient presents with   Medication Management    HPI:  Ms. Shai Rasmussen is a 88 y.o. female with history of  demand ischemia with hypertensive urgency,  LBBB,  TIA/CVA in 2016,  CKD stage II,  HTN,  HLD,  carotid artery disease,  hypothyroidism,  anxiety, depression,  Coronary disease, stent placed to proximal to mid LAD  ejection fraction 35 to 40%, repeat 09/2019: 40 to 45% ECHO: January 2023 ejection fraction 40% Who presents for follow-up of her ischemic cardiomyopathy  Last seen in clinic January 2023  Feels well on today's visit Denies significant shortness of breath or chest pain on exertion  Has not take torsemide or potassium as reports weight has been stable Weight at home 149, usually 150-151  Recent diagnosis of C.diff better after long course of antibiotics 02/2022  Reports blood pressure typically well controlled Not on isosorbide, thinks that somebody stopped it but does not know where or when  EKG personally reviewed by myself on todays visit Sinus bradycardia rate 59 bpm left bundle branch block  Other past medical history reviewed Echocardiogram   1. Frequent PVCs during study.  Left ventricular ejection  fraction, by estimation, is 40%.  paradoxical septal  motion due to known LBBB. There is moderate concentric left ventricular  hypertrophy.   3. Right ventricular systolic function is normal. The right ventricular  size is normal.   4. Left atrial size was severely dilated.   Covid, in hospital,March 06 2021 BP elevated on arrival to the hospital Creatinine 0.9 on arrival,  Treated with diuretic for shortness of breath, concern for CHF Sent home 03/10/2021,d/c with CR 1.6, BUN 92, above her baseline  After discharge, had episode of syncope on toilet in the setting of diarrhea on 03/11/21 Presented  to the hospital again with dehydration multiple episodes of profuse watery diarrhea bradycardic into the 40s, given 0.5 mg of atropine in the ER Creatinine 2.12, BUN 100 Given IV fluids  hospital September 2020, ejection fraction 35 to 40% She had symptoms of chest pain and accelerated HTN found to have elevated high sensitivity troponin peaking at 562 and acute systolic CHF.    cardiac catheterization, with severe proximal to mid LAD disease, stent placed  Follow-up echocardiogram December 2020 ejection fraction 40 up to 45% Tolerating Entresto, carvedilol, HCTZ Reports blood pressure stable at home  PMH:   has a past medical history of Anemia, CHF (congestive heart failure) ([place]) (10/05/2021), Diverticulitis, GERD (gastroesophageal reflux disease), Hyperlipidemia, Hypertension, Hypothyroidism, Myocardial infarction ([place]) (08/2017), TIA (transient ischemic attack), Vertigo, and [name].  PSH:    Past Surgical History:  Procedure Laterality Date   COLONOSCOPY WITH PROPOFOL N/A 06/22/2018   Procedure: COLONOSCOPY WITH PROPOFOL;  Surgeon: Jonathon Bellows, MD;  Location: [facility] ENDOSCOPY;  Service: Endoscopy;  Laterality: N/A;   CORONARY STENT INTERVENTION N/A 06/07/2019   Procedure: CORONARY STENT INTERVENTION;  Surgeon: [name], MD;  Location: [facility];  Service: Cardiovascular;  Laterality: N/A;   ESOPHAGOGASTRODUODENOSCOPY (EGD) WITH PROPOFOL N/A 06/22/2018   Procedure: ESOPHAGOGASTRODUODENOSCOPY (EGD) WITH PROPOFOL;  Surgeon: Jonathon Bellows, MD;  Location: [facility] ENDOSCOPY;  Service: Endoscopy;  Laterality: N/A;   KNEE ARTHROSCOPY Right    REPLACEMENT TOTAL KNEE BILATERAL     RIGHT/LEFT HEART CATH AND CORONARY ANGIOGRAPHY N/A 06/07/2019  Procedure: RIGHT/LEFT HEART CATH AND CORONARY ANGIOGRAPHY;  Surgeon: [name], MD;  Location: [facility];  Service: Cardiovascular;  Laterality: N/A;   SHOULDER SURGERY Left     Current Outpatient  Medications  Medication Sig Dispense Refill   albuterol (PROVENTIL) (2.5 MG/3ML) 0.083% nebulizer solution USE 1 VIAL IN NEBULIZER EVERY 4 HOURS AS NEEDED 180 mL 6   allopurinol (ZYLOPRIM) 300 MG tablet Take 1 tablet by mouth once daily (Patient taking differently: Take 300 mg by mouth daily.) 90 tablet 3   amitriptyline (ELAVIL) 10 MG tablet TAKE 1 TABLET BY MOUTH AT BEDTIME (Patient taking differently: Take 10 mg by mouth at bedtime.) 90 tablet 3   ASPIRIN LOW DOSE 81 MG EC tablet NEW PRESCRIPTION REQUEST: TAKE ONE TABLET BY MOUTH EVERY DAY (Patient taking differently: Take 81 mg by mouth daily.) 90 tablet 3   atorvastatin (LIPITOR) 40 MG tablet Take 1 tablet (40 mg total) by mouth daily. 90 tablet 3   carvedilol (COREG) 12.5 MG tablet Take 1 tablet (12.5 mg total) by mouth 2 (two) times daily. 180 tablet 3   clopidogrel (PLAVIX) 75 MG tablet Take 1 tablet (75 mg total) by mouth daily. 90 tablet 3   Ferrous Sulfate 27 MG TABS Take 27 mg by mouth at bedtime.     fidaxomicin (DIFICID) 200 MG TABS tablet Take 200 mg by mouth 2 (two) times daily with a meal. Take 1 tablet (200 mg total) by mouth Two (2) times a day.     fluticasone (FLONASE) 50 MCG/ACT nasal spray Place 2 sprays into both nostrils daily. 16 g 6   hydrALAZINE (APRESOLINE) 50 MG tablet Take 50 mg three times a day, For high blood pressure >150 Take extra hydralazine (Patient taking differently: Take 50 mg by mouth 3 (three) times daily. Take 50 mg three times a day, For high blood pressure >150 Take extra hydralazine) 300 tablet 3   hydrocortisone 2.5 % cream Apply 1 application topically 2 (two) times daily as needed (for skin irritation).     isosorbide mononitrate (IMDUR) 30 MG 24 hr tablet Take 1 tablet (30 mg total) by mouth daily. For high blood pressure >150 can take extra isosorbide 100 tablet 3   levothyroxine (SYNTHROID) 137 MCG tablet Take 1 tablet by mouth once daily (Patient taking differently: Take 137 mcg by mouth daily  before breakfast.) 90 tablet 0   Melatonin 10 MG TABS Take 20 mg by mouth at bedtime.     mirtazapine (REMERON) 30 MG tablet TAKE 1 TABLET BY MOUTH AT BEDTIME (Patient taking differently: Take 30 mg by mouth at bedtime.) 90 tablet 0   Multiple Vitamin (MULTIVITAMIN) capsule Take 1 capsule by mouth every evening.     pantoprazole (PROTONIX) 40 MG tablet Take 1 tablet by mouth once daily (Patient taking differently: Take 40 mg by mouth daily.) 90 tablet 0   potassium chloride (KLOR-CON) 10 MEQ tablet Take potassium 20 meq when you take torsemide (Patient taking differently: Take 20 mEq by mouth See admin instructions. Take potassium 20 meq when you take torsemide) 90 tablet 3   sacubitril-valsartan (ENTRESTO) 97-103 MG Take 1 tablet by mouth 2 (two) times daily. 180 tablet 3   sertraline (ZOLOFT) 25 MG tablet Take 1 tablet by mouth once daily (Patient taking differently: Take 25 mg by mouth at bedtime.) 90 tablet 0   torsemide (DEMADEX) 20 MG tablet Take 1 tablet (20 mg total) by mouth daily as needed. For shortness of breath and  For weight 163 or high with a potassium tab (Patient taking differently: Take 20 mg by mouth daily as needed (see below). For shortness of breath and For weight 163 or high with a potassium tab) 90 tablet 3   vancomycin (VANCOCIN) 125 MG capsule Take 125 mg by mouth 4 (four) times daily. Take 1 capsule (125 mg total) by mouth two (2) times a day for 7 days, THEN 1 capsule (125 mg total) daily for 7 days, THEN 1 capsule (125 mg total) every other day for 7 days, THEN 1 capsule (125 mg total) every third day for 21 days. Start after finishing the fidaxomicin (dificid).     No current facility-administered medications for this visit.    Allergies:   Latex, Levofloxacin, Lisinopril, Povidone-iodine, Simvastatin, and Codeine   Social History:  The patient  reports that she has never smoked. She has never used smokeless tobacco. She reports that she does not drink alcohol and does  not use drugs.   Family History:   family history includes Alzheimer's disease in her brother and brother; Breast cancer in her sister; Cancer in her brother; Clotting disorder in her daughter; Fibromyalgia in her daughter; Heart attack in her brother; Heart disease in her brother, brother, father, and mother; Hypertension in her mother; Mental illness in her brother; Stroke in her brother and mother.    Review of Systems: Review of Systems  Constitutional: Negative.   HENT: Negative.    Respiratory: Negative.    Cardiovascular: Negative.   Gastrointestinal: Negative.   Musculoskeletal: Negative.   Neurological:  Positive for tremors.  Psychiatric/Behavioral: Negative.    All other systems reviewed and are negative.   PHYSICAL EXAM: VS:  BP (!) 122/42 (BP Location: Left Arm)   Pulse (!) 54   Wt 152 lb 12.8 oz (69.3 kg)   BMI 27.07 kg/m  , BMI Body mass index is 27.07 kg/m. Constitutional:  oriented to person, place, and time. No distress.  HENT:  Head: Grossly normal Eyes:  no discharge. No scleral icterus.  Neck: No JVD, no carotid bruits  Cardiovascular: Regular rate and rhythm, no murmurs appreciated Pulmonary/Chest: Clear to auscultation bilaterally, no wheezes or rails Abdominal: Soft.  no distension.  no tenderness.  Musculoskeletal: Normal range of motion Neurological:  normal muscle tone. Coordination normal. No atrophy Skin: Skin warm and dry Psychiatric: normal affect, pleasant  Recent Labs: 10/07/2021: B Natriuretic Peptide 355.0; Magnesium 1.8 12/15/2021: TSH 0.620 02/23/2022: ALT 17; BUN 13; Creatinine, Ser 1.12; Hemoglobin 10.3; Platelets 166; Potassium 5.1; Sodium 145   Lipid Panel Lab Results  Component Value Date   CHOL 115 12/15/2021   HDL 29 (L) 12/15/2021   LDLCALC 53 12/15/2021   TRIG 201 (H) 12/15/2021      Wt Readings from Last 3 Encounters:  04/09/22 152 lb 12.8 oz (69.3 kg)  02/23/22 153 lb (69.4 kg)  01/14/22 158 lb (71.7 kg)      ASSESSMENT AND PLAN:  Problem List Items Addressed This Visit       Cardiology Problems   Benign essential HTN   Relevant Orders   EKG 12-Lead   Acute on chronic combined systolic and diastolic congestive heart failure (HCC)   Relevant Orders   EKG 12-Lead   Near syncope   CAD (coronary artery disease) - Primary   Relevant Orders   EKG 12-Lead   External carotid artery stenosis   Artery disease, cerebral   Carotid stenosis     Other   CKD (chronic  kidney disease), stage IIIa   Relevant Orders   EKG 12-Lead   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   Other Visit Diagnoses     History of syncope       Demand ischemia ([place])         Ischemic cardiomyopathy Tolerating Entresto, carvedilol, hydralazine  Recommend she restart Imdur 30  torsemide 20 mg as needed for weight 153 pounds or higher with potassium 20 as needed Appears euvolemic  CAD with stable angina Stent placed to her proximal to mid LAD Ejection fraction 40 to 45%, unchanged from 2020 Currently with no symptoms of angina. No further workup at this time. Continue current medication regimen.  Carotid bruit Mild disease bilaterally, less than 39%  Hyperlipidemia On Lipitor 40 daily Cholesterol is at goal on the current lipid regimen. No changes to the medications were made.  Chronic renal sufficiency stage III Creatinine 1.12, at her baseline >1 month ago   Total encounter time more than 30 minutes  Greater than 50% was spent in counseling and coordination of care with the patient    Signed, Esmond Plants, M.D., Ph.D. [place], [place]

## 2022-04-09 ENCOUNTER — Ambulatory Visit: Payer: Medicare Other | Admitting: Cardiovascular Disease

## 2022-04-09 ENCOUNTER — Encounter: Payer: Self-pay | Admitting: Cardiovascular Disease

## 2022-04-09 VITALS — BP 122/42 | HR 54 | Wt 152.8 lb

## 2022-04-09 DIAGNOSIS — I679 Cerebrovascular disease, unspecified: Secondary | ICD-10-CM

## 2022-04-09 DIAGNOSIS — I2510 Atherosclerotic heart disease of native coronary artery without angina pectoris: Secondary | ICD-10-CM

## 2022-04-09 DIAGNOSIS — I248 Other forms of acute ischemic heart disease: Secondary | ICD-10-CM

## 2022-04-09 DIAGNOSIS — Z87898 Personal history of other specified conditions: Secondary | ICD-10-CM

## 2022-04-09 DIAGNOSIS — I6529 Occlusion and stenosis of unspecified carotid artery: Secondary | ICD-10-CM

## 2022-04-09 DIAGNOSIS — Z8673 Personal history of transient ischemic attack (TIA), and cerebral infarction without residual deficits: Secondary | ICD-10-CM | POA: Diagnosis not present

## 2022-04-09 DIAGNOSIS — N183 Chronic kidney disease, stage 3 unspecified: Secondary | ICD-10-CM | POA: Diagnosis not present

## 2022-04-09 DIAGNOSIS — I5043 Acute on chronic combined systolic (congestive) and diastolic (congestive) heart failure: Secondary | ICD-10-CM | POA: Diagnosis not present

## 2022-04-09 DIAGNOSIS — I6523 Occlusion and stenosis of bilateral carotid arteries: Secondary | ICD-10-CM | POA: Diagnosis not present

## 2022-04-09 DIAGNOSIS — R55 Syncope and collapse: Secondary | ICD-10-CM | POA: Diagnosis not present

## 2022-04-09 DIAGNOSIS — I10 Essential (primary) hypertension: Secondary | ICD-10-CM

## 2022-04-09 MED ORDER — NITROGLYCERIN 0.4 MG SL SUBL: 0.4000 mg | SUBLINGUAL_TABLET | SUBLINGUAL | 3 refills | Status: AC | PRN

## 2022-04-09 MED ORDER — ISOSORBIDE MONONITRATE ER 30 MG PO TB24: 30.0000 mg | ORAL_TABLET | Freq: Every day | ORAL | 3 refills | Status: DC

## 2022-04-09 MED ORDER — TORSEMIDE 20 MG PO TABS: 20.0000 mg | ORAL_TABLET | Freq: Every day | ORAL | 3 refills | Status: DC | PRN

## 2022-04-09 NOTE — Patient Instructions (Addendum)
Medication Instructions:  Please take torsemide and potassium for weight 153 pounds or higher  Please restart isosorbide mononitrate (imdur) 30 mg daily  If you need a refill on your cardiac medications before your next appointment, please call your pharmacy.   Lab work: No new labs needed  Testing/Procedures: No new testing needed  Follow-Up: At [facility], you and your health needs are our priority.  As part of our continuing mission to provide you with exceptional heart care, we have created designated Provider Care Teams.  These Care Teams include your primary Cardiologist (physician) and Advanced Practice Providers (APPs -  Physician Assistants and Nurse Practitioners) who all work together to provide you with the care you need, when you need it.  You will need a follow up appointment in 6 months  Providers on your designated Care Team:   Murray Hodgkins, NP Christell Faith, PA-C Cadence Kathlen Mody, [place]  COVID-19 Vaccine Information can be found at: [email or web address] For questions related to vaccine distribution or appointments, please email [email or web address] or call [phone].

## 2022-04-10 ENCOUNTER — Other Ambulatory Visit: Payer: Self-pay | Admitting: Family Medicine

## 2022-04-10 DIAGNOSIS — K219 Gastro-esophageal reflux disease without esophagitis: Secondary | ICD-10-CM

## 2022-04-21 ENCOUNTER — Ambulatory Visit (INDEPENDENT_AMBULATORY_CARE_PROVIDER_SITE_OTHER): Payer: Medicare Other | Admitting: Family Medicine

## 2022-04-21 ENCOUNTER — Other Ambulatory Visit: Payer: Self-pay | Admitting: Family Medicine

## 2022-04-21 ENCOUNTER — Encounter: Payer: Self-pay | Admitting: Family Medicine

## 2022-04-21 VITALS — BP 142/53 | HR 59 | Resp 18 | Wt 152.0 lb

## 2022-04-21 DIAGNOSIS — Z8673 Personal history of transient ischemic attack (TIA), and cerebral infarction without residual deficits: Secondary | ICD-10-CM | POA: Diagnosis not present

## 2022-04-21 DIAGNOSIS — E039 Hypothyroidism, unspecified: Secondary | ICD-10-CM

## 2022-04-21 DIAGNOSIS — M129 Arthropathy, unspecified: Secondary | ICD-10-CM | POA: Diagnosis not present

## 2022-04-21 DIAGNOSIS — F3289 Other specified depressive episodes: Secondary | ICD-10-CM

## 2022-04-21 DIAGNOSIS — I10 Essential (primary) hypertension: Secondary | ICD-10-CM | POA: Diagnosis not present

## 2022-04-21 DIAGNOSIS — F3341 Major depressive disorder, recurrent, in partial remission: Secondary | ICD-10-CM

## 2022-04-21 DIAGNOSIS — I5043 Acute on chronic combined systolic (congestive) and diastolic (congestive) heart failure: Secondary | ICD-10-CM

## 2022-04-21 DIAGNOSIS — A0472 Enterocolitis due to Clostridium difficile, not specified as recurrent: Secondary | ICD-10-CM | POA: Diagnosis not present

## 2022-04-21 DIAGNOSIS — K580 Irritable bowel syndrome with diarrhea: Secondary | ICD-10-CM

## 2022-04-21 DIAGNOSIS — R0989 Other specified symptoms and signs involving the circulatory and respiratory systems: Secondary | ICD-10-CM

## 2022-04-21 DIAGNOSIS — G250 Essential tremor: Secondary | ICD-10-CM | POA: Diagnosis not present

## 2022-04-21 NOTE — Progress Notes (Signed)
Established patient visit  I,[name],acting as a scribe for [name], MD.,have documented all relevant documentation on the behalf of [name], MD,as directed by  [name], MD while in the presence of [name], MD.   Patient: Stephanie Charles   DOB: 12-17-1929   88 y.o. Female  MRN: [id number] Visit Date: 04/21/2022  Today's healthcare provider: [name], MD   Chief Complaint  Patient presents with   Follow-up   Hypertension   Subjective    HPI  Patient comes in today for recheck.  She feels the best she has in some time.  Her diarrhea has resolved.  It was a C. difficile problem for some time. Her daughter is with her and agrees that she is doing very well.  Hypertension, follow-up  BP Readings from Last 3 Encounters:  04/21/22 (!) 142/53  04/09/22 (!) 122/42  02/23/22 (!) 145/55   Wt Readings from Last 3 Encounters:  04/21/22 152 lb (68.9 kg)  04/09/22 152 lb 12.8 oz (69.3 kg)  02/23/22 153 lb (69.4 kg)     She was last seen for hypertension 4 months ago.  Management since that visit includes; taking carvedilol 12.5 mg and isosorbide mononitrate 30 mg.  Outside blood pressures are normal.  --------------------------------------------------------------------------------------------------   Medications: Outpatient Medications Prior to Visit  Medication Sig   albuterol (PROVENTIL) (2.5 MG/3ML) 0.083% nebulizer solution USE 1 VIAL IN NEBULIZER EVERY 4 HOURS AS NEEDED   allopurinol (ZYLOPRIM) 300 MG tablet Take 1 tablet by mouth once daily (Patient taking differently: Take 300 mg by mouth daily.)   amitriptyline (ELAVIL) 10 MG tablet TAKE 1 TABLET BY MOUTH AT BEDTIME (Patient taking differently: Take 10 mg by mouth at bedtime.)   ASPIRIN LOW DOSE 81 MG EC tablet NEW PRESCRIPTION REQUEST: TAKE ONE TABLET BY MOUTH EVERY DAY (Patient taking differently: Take 81 mg by mouth daily.)   atorvastatin (LIPITOR) 40 MG  tablet Take 1 tablet (40 mg total) by mouth daily.   carvedilol (COREG) 12.5 MG tablet Take 1 tablet (12.5 mg total) by mouth 2 (two) times daily.   clopidogrel (PLAVIX) 75 MG tablet Take 1 tablet (75 mg total) by mouth daily.   Ferrous Sulfate 27 MG TABS Take 27 mg by mouth at bedtime.   fidaxomicin (DIFICID) 200 MG TABS tablet Take 200 mg by mouth 2 (two) times daily with a meal. Take 1 tablet (200 mg total) by mouth Two (2) times a day.   fluticasone (FLONASE) 50 MCG/ACT nasal spray Place 2 sprays into both nostrils daily.   hydrALAZINE (APRESOLINE) 50 MG tablet Take 50 mg three times a day, For high blood pressure >150 Take extra hydralazine (Patient taking differently: Take 50 mg by mouth 3 (three) times daily. Take 50 mg three times a day, For high blood pressure >150 Take extra hydralazine)   hydrocortisone 2.5 % cream Apply 1 application topically 2 (two) times daily as needed (for skin irritation).   isosorbide mononitrate (IMDUR) 30 MG 24 hr tablet Take 1 tablet (30 mg total) by mouth daily. For high blood pressure >150 can take extra isosorbide   levothyroxine (SYNTHROID) 137 MCG tablet Take 1 tablet (137 mcg total) by mouth daily before breakfast.   Melatonin 10 MG TABS Take 20 mg by mouth at bedtime.   mirtazapine (REMERON) 30 MG tablet TAKE 1 TABLET BY MOUTH AT BEDTIME (Patient taking differently: Take 30 mg by mouth at bedtime.)   Multiple  Vitamin (MULTIVITAMIN) capsule Take 1 capsule by mouth every evening.   nitroGLYCERIN (NITROSTAT) 0.4 MG SL tablet Place 1 tablet (0.4 mg total) under the tongue every 5 (five) minutes as needed for chest pain.   pantoprazole (PROTONIX) 40 MG tablet Take 1 tablet by mouth once daily   potassium chloride (KLOR-CON) 10 MEQ tablet Take potassium 20 meq when you take torsemide (Patient taking differently: Take 20 mEq by mouth See admin instructions. Take potassium 20 meq when you take torsemide)   sacubitril-valsartan (ENTRESTO) 97-103 MG Take 1 tablet  by mouth 2 (two) times daily.   sertraline (ZOLOFT) 25 MG tablet Take 1 tablet by mouth once daily (Patient taking differently: Take 25 mg by mouth at bedtime.)   torsemide (DEMADEX) 20 MG tablet Take 1 tablet (20 mg total) by mouth daily as needed. For weight 153 or higher with a potassium tab   No facility-administered medications prior to visit.    Review of Systems  Constitutional:  Negative for appetite change, chills, fatigue and fever.  Respiratory:  Negative for chest tightness and shortness of breath.   Cardiovascular:  Negative for chest pain and palpitations.  Gastrointestinal:  Negative for abdominal pain, nausea and vomiting.  Neurological:  Negative for dizziness and weakness.    Last CBC Lab Results  Component Value Date   WBC 9.7 02/23/2022   HGB 10.3 (L) 02/23/2022   HCT 31.9 (L) 02/23/2022   MCV 95 02/23/2022   MCH 30.7 02/23/2022   RDW 14.0 02/23/2022   PLT 166 02/23/2022       Objective    BP (!) 142/53 (BP Location: Right Arm, Patient Position: Sitting, Cuff Size: Normal)   Pulse (!) 59   Resp 18   Wt 152 lb (68.9 kg)   SpO2 94%   BMI 26.93 kg/m  BP Readings from Last 3 Encounters:  04/21/22 (!) 142/53  04/09/22 (!) 122/42  02/23/22 (!) 145/55   Wt Readings from Last 3 Encounters:  04/21/22 152 lb (68.9 kg)  04/09/22 152 lb 12.8 oz (69.3 kg)  02/23/22 153 lb (69.4 kg)      Physical Exam Vitals reviewed.  Constitutional:      Appearance: Normal appearance.  HENT:     Right Ear: External ear normal.     Left Ear: External ear normal.     Nose: Nose normal.  Eyes:     General: No scleral icterus.    Conjunctiva/sclera: Conjunctivae normal.  Cardiovascular:     Rate and Rhythm: Normal rate and regular rhythm.     Heart sounds: Normal heart sounds.  Pulmonary:     Effort: Pulmonary effort is normal.     Breath sounds: Normal breath sounds.  Abdominal:     Palpations: Abdomen is soft.     Tenderness: There is no abdominal tenderness.   Lymphadenopathy:     Cervical: No cervical adenopathy.  Skin:    General: Skin is warm and dry.     Comments: Fair skin.  Neurological:     General: No focal deficit present.     Mental Status: She is alert and oriented to person, place, and time.  Psychiatric:        Mood and Affect: Mood normal.        Behavior: Behavior normal.        Thought Content: Thought content normal.       No results found for any visits on 04/21/22.  Assessment & Plan     1. Benign  essential HTN Good blood pressure control  2. Acute on chronic combined systolic and diastolic CHF (congestive heart failure) ([place]) Niccoli stable and treated on Entresto.  3. C. difficile diarrhea Symptomatically resolved.  Has had complete GI work-up for this.  4. Irritable bowel syndrome with diarrhea Improved  5. Adult hypothyroidism Rate for euthyroid TSH  6. Benign essential tremor   7. Arthropathia   8. 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   9. Recurrent major depressive disorder, in partial remission ([place])    No follow-ups on file.      I, [name], MD, have reviewed all documentation for this visit. The documentation on 04/24/22 for the exam, diagnosis, procedures, and orders are all accurate and complete.    Sadik Piascik Cranford Mon, MD  [facility] [phone] (phone) [phone] (fax)  [place]

## 2022-05-06 NOTE — Telephone Encounter (Signed)
Close encounter please med attached

## 2022-05-27 IMAGING — DX DG CHEST 1V PORT
1 series · 1 of 1 positions shown · non-contrast
Comparison: Radiograph 03/06/2021

CLINICAL DATA: Weakness.  COVID positive.

EXAM:
PORTABLE CHEST 1 VIEW

[chest ap]
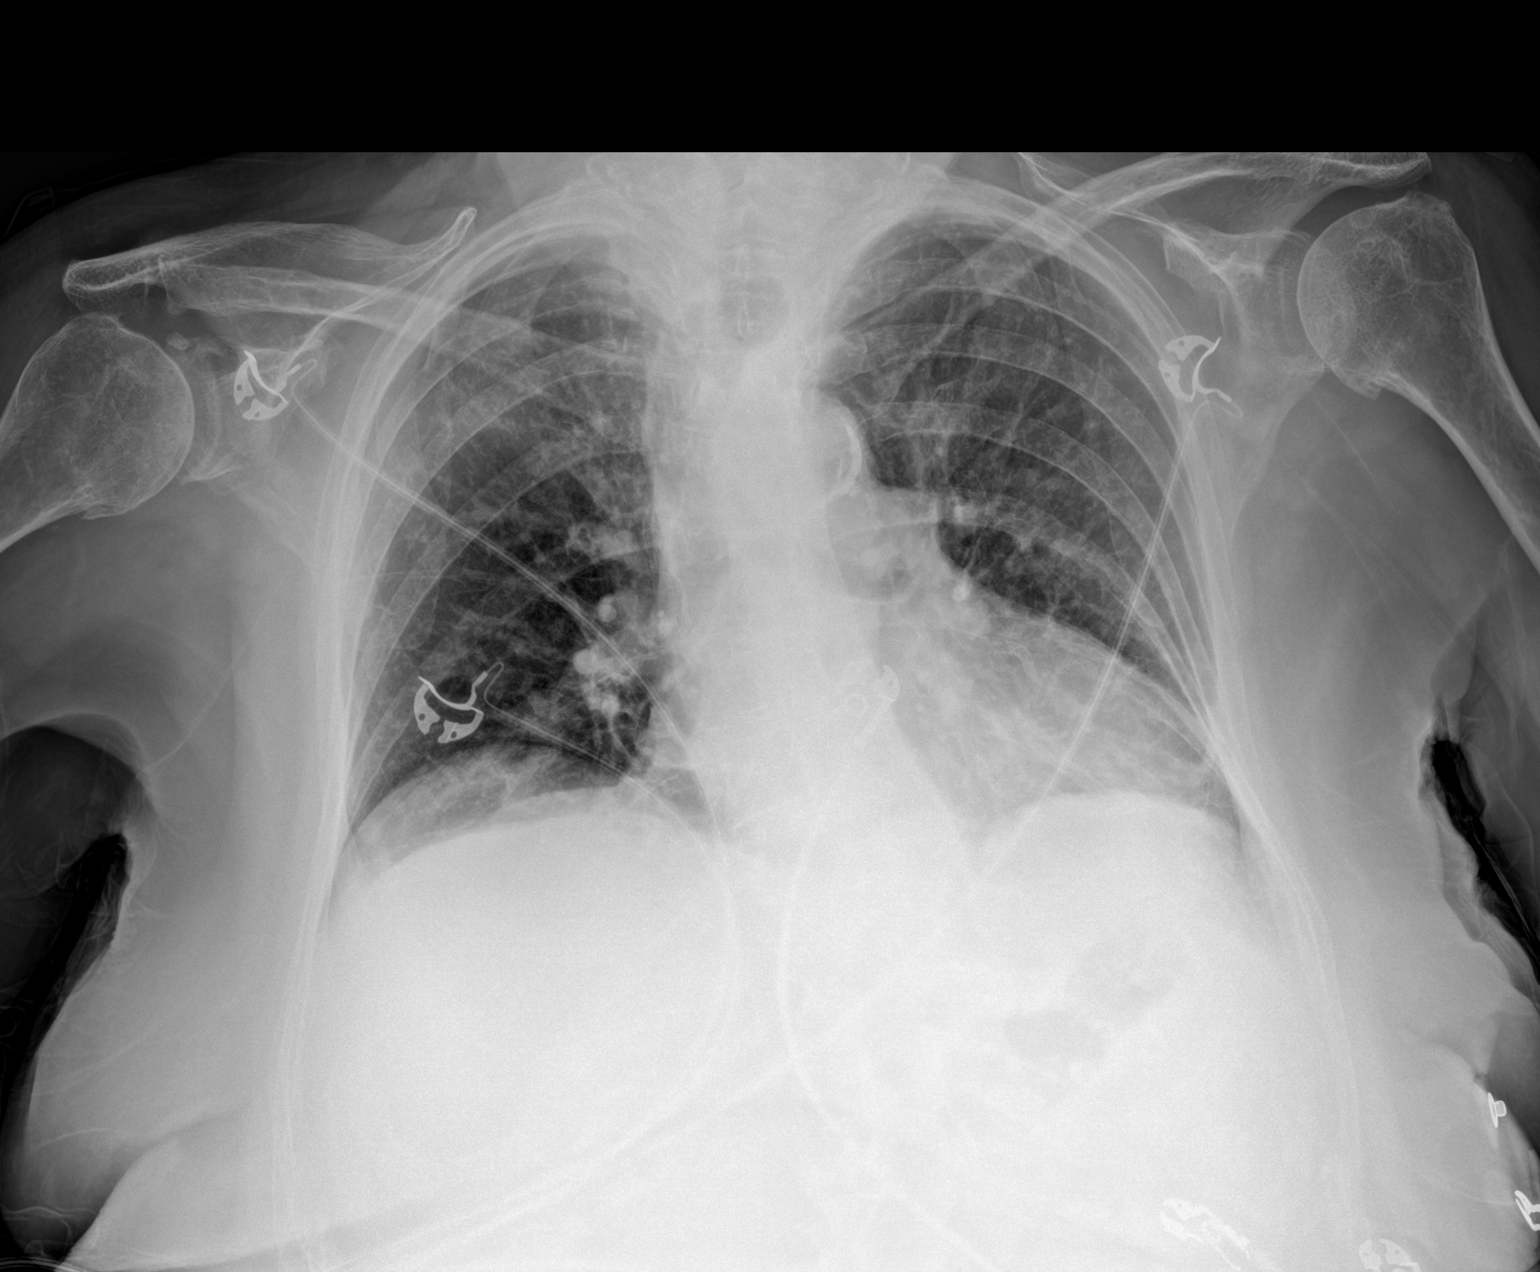

[1 of 1 positions shown; findings below may reference images not displayed]

FINDINGS: Persistent low lung volumes. Stable heart size and mediastinal
contours. Aortic atherosclerosis. Streaky left lung base opacity.
Slight improvement in diffuse interstitial opacities. No pleural
effusion or pneumothorax. Stable osseous structures.
IMPRESSION: Low lung volumes with streaky left lung base opacity, atelectasis
versus pneumonia. Slight improvement in diffuse interstitial
opacities from recent exam.

## 2022-06-01 ENCOUNTER — Ambulatory Visit: Payer: Self-pay

## 2022-06-01 NOTE — Telephone Encounter (Signed)
  Chief Complaint: hypotension Symptoms: BP 115/47, 127/57 Frequency: several days DBP been in 50s Pertinent Negatives: Patient denies any symptoms Disposition: '[]'$ ED /'[]'$ Urgent Care (no appt availability in office) / '[x]'$ Appointment(In office/virtual)/ '[]'$  [facility]/ '[]'$ Home Care/ '[]'$ Refused Recommended Disposition /'[]'$ [place] [facility]/ '[]'$  Follow-up with PCP Additional Notes: pt wanted to schedule appt and discuss BP meds with Dr. Rosanna Randy since DBP been low past few days. Scheduled appt for 06/03/22 at 1400.   Reason for Disposition  Diastolic BP < 50 mm Hg  Answer Assessment - Initial Assessment Questions 1. BLOOD PRESSURE: "What is the blood pressure?" "Did you take at least two measurements 5 minutes apart?"     115/47, reset meter and rechecked 127/57 2. ONSET: "When did you take your blood pressure?"     Today but been DBP been low in 50s 3. HOW: "How did you obtain the blood pressure?" (e.g., visiting nurse, automatic home BP monitor)     Auto home 4. HISTORY: "Do you have a history of low blood pressure?" "What is your blood pressure normally?"     yes 5. MEDICINES: "Are you taking any medications for blood pressure?" If Yes, ask: "Have they been changed recently?"     yes 7. OTHER SYMPTOMS: "Have you been sick recently?" "Have you had a recent injury?"     none  Protocols used: Blood Pressure - Low-A-AH

## 2022-06-02 NOTE — Progress Notes (Signed)
I,[name],acting as a scribe for Stephanie Durie, MD.,have documented all relevant documentation on the behalf of Stephanie Durie, MD,as directed by  Stephanie Durie, MD while in the presence of Stephanie Durie, MD.   Established patient visit   Patient: Stephanie Charles   DOB: 1930/05/05   88 y.o. Female  MRN: [id number] Visit Date: 06/03/2022  Today's healthcare provider: [name], MD   No chief complaint on file.  Subjective    HPI  -Would like paperwork for handicap parking Patient comes in today for follow-up.  She is taking her medications as prescribed.  She actually feels well.  Occasionally lightheaded when she first stands up. Hypertension, follow-up  BP Readings from Last 3 Encounters:  04/21/22 (!) 142/53  04/09/22 (!) 122/42  02/23/22 (!) 145/55   Wt Readings from Last 3 Encounters:  04/21/22 152 lb (68.9 kg)  04/09/22 152 lb 12.8 oz (69.3 kg)  02/23/22 153 lb (69.4 kg)     She was last seen for hypertension 1 months ago.  BP at that visit was as above. Management since that visit includes none.  She reports excellent compliance with treatment. She is not having side effects.  She is following a Regular diet. She is exercising. She does not smoke.  Use of agents associated with hypertension: none.   Outside blood pressures are 120/60. Symptoms: No chest pain No chest pressure  No palpitations No syncope  No dyspnea No orthopnea  No paroxysmal nocturnal dyspnea Yes lower extremity edema   Pertinent labs Lab Results  Component Value Date   CHOL 115 12/15/2021   HDL 29 (L) 12/15/2021   LDLCALC 53 12/15/2021   TRIG 201 (H) 12/15/2021   CHOLHDL 4.0 12/15/2021   Lab Results  Component Value Date   NA 145 (H) 02/23/2022   K 5.1 02/23/2022   CREATININE 1.12 (H) 02/23/2022   EGFR 47 (L) 02/23/2022   GLUCOSE 106 (H) 02/23/2022   TSH 0.620 12/15/2021     The ASCVD Risk score (Arnett DK, et al., 2019) failed to  calculate for the following reasons:   The 2019 ASCVD risk score is only valid for ages 55 to 3   The patient has a prior MI or stroke diagnosis  ---------------------------------------------------------------------------------------------------   Medications: Outpatient Medications Prior to Visit  Medication Sig   albuterol (PROVENTIL) (2.5 MG/3ML) 0.083% nebulizer solution USE 1 VIAL IN NEBULIZER EVERY 4 HOURS AS NEEDED   allopurinol (ZYLOPRIM) 300 MG tablet Take 1 tablet by mouth once daily (Patient taking differently: Take 300 mg by mouth daily.)   amitriptyline (ELAVIL) 10 MG tablet TAKE 1 TABLET BY MOUTH AT BEDTIME (Patient taking differently: Take 10 mg by mouth at bedtime.)   ASPIRIN LOW DOSE 81 MG EC tablet NEW PRESCRIPTION REQUEST: TAKE ONE TABLET BY MOUTH EVERY DAY (Patient taking differently: Take 81 mg by mouth daily.)   atorvastatin (LIPITOR) 40 MG tablet Take 1 tablet (40 mg total) by mouth daily.   carvedilol (COREG) 12.5 MG tablet Take 1 tablet (12.5 mg total) by mouth 2 (two) times daily.   clopidogrel (PLAVIX) 75 MG tablet Take 1 tablet (75 mg total) by mouth daily.   Ferrous Sulfate 27 MG TABS Take 27 mg by mouth at bedtime.   fidaxomicin (DIFICID) 200 MG TABS tablet Take 200 mg by mouth 2 (two) times daily with a meal. Take 1 tablet (200 mg total) by mouth Two (2) times a day.   fluticasone (  FLONASE) 50 MCG/ACT nasal spray Place 2 sprays into both nostrils daily.   hydrALAZINE (APRESOLINE) 50 MG tablet Take 50 mg three times a day, For high blood pressure >150 Take extra hydralazine (Patient taking differently: Take 50 mg by mouth 3 (three) times daily. Take 50 mg three times a day, For high blood pressure >150 Take extra hydralazine)   hydrocortisone 2.5 % cream Apply 1 application topically 2 (two) times daily as needed (for skin irritation).   isosorbide mononitrate (IMDUR) 30 MG 24 hr tablet Take 1 tablet (30 mg total) by mouth daily. For high blood pressure >150 can  take extra isosorbide   levothyroxine (SYNTHROID) 137 MCG tablet Take 1 tablet (137 mcg total) by mouth daily before breakfast.   Melatonin 10 MG TABS Take 20 mg by mouth at bedtime.   mirtazapine (REMERON) 30 MG tablet TAKE 1 TABLET BY MOUTH AT BEDTIME   Multiple Vitamin (MULTIVITAMIN) capsule Take 1 capsule by mouth every evening.   nitroGLYCERIN (NITROSTAT) 0.4 MG SL tablet Place 1 tablet (0.4 mg total) under the tongue every 5 (five) minutes as needed for chest pain.   pantoprazole (PROTONIX) 40 MG tablet Take 1 tablet by mouth once daily   potassium chloride (KLOR-CON) 10 MEQ tablet Take potassium 20 meq when you take torsemide (Patient taking differently: Take 20 mEq by mouth See admin instructions. Take potassium 20 meq when you take torsemide)   sacubitril-valsartan (ENTRESTO) 97-103 MG Take 1 tablet by mouth 2 (two) times daily.   sertraline (ZOLOFT) 25 MG tablet Take 1 tablet by mouth once daily   torsemide (DEMADEX) 20 MG tablet Take 1 tablet (20 mg total) by mouth daily as needed. For weight 153 or higher with a potassium tab   No facility-administered medications prior to visit.    Review of Systems  Last hemoglobin A1c Lab Results  Component Value Date   HGBA1C 5.6 03/07/2021       Objective    There were no vitals taken for this visit. BP Readings from Last 3 Encounters:  06/03/22 (!) 123/45  04/21/22 (!) 142/53  04/09/22 (!) 122/42   Wt Readings from Last 3 Encounters:  06/03/22 150 lb (68 kg)  04/21/22 152 lb (68.9 kg)  04/09/22 152 lb 12.8 oz (69.3 kg)      Physical Exam Vitals reviewed.  Constitutional:      Appearance: Normal appearance.  HENT:     Right Ear: External ear normal.     Left Ear: External ear normal.     Nose: Nose normal.  Eyes:     General: No scleral icterus.    Conjunctiva/sclera: Conjunctivae normal.  Cardiovascular:     Rate and Rhythm: Normal rate and regular rhythm.     Heart sounds: Normal heart sounds.  Pulmonary:      Effort: Pulmonary effort is normal.     Breath sounds: Normal breath sounds.  Abdominal:     Palpations: Abdomen is soft.     Tenderness: There is no abdominal tenderness.  Lymphadenopathy:     Cervical: No cervical adenopathy.  Skin:    General: Skin is warm and dry.     Comments: Fair skin.  Neurological:     General: No focal deficit present.     Mental Status: She is alert and oriented to person, place, and time.  Psychiatric:        Mood and Affect: Mood normal.        Behavior: Behavior normal.  Thought Content: Thought content normal.       No results found for any visits on 06/03/22.  Assessment & Plan     1. Hypotension  At this time decrease hydralazine from 50 to 25 mg twice daily. Patient has always had labile blood pressures and this will need to be adjusted over time.  2. Acute on chronic combined systolic and diastolic congestive heart failure ([place]) Present medications stable.  She is also on Entresto which will frequently lead to hypotension  3. Coronary artery disease involving native coronary artery of native heart without angina pectoris Risk factors treated  4. Stage 3a chronic kidney disease (H[facility]) Avoid anti-inflammatories  5. 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   6. Recurrent major depressive disorder, in partial remission ([place]) Improved.  Continue mirtazapine and sertraline   No follow-ups on file.      I, Stephanie Durie, MD, have reviewed all documentation for this visit. The documentation on 06/06/22 for the exam, diagnosis, procedures, and orders are all accurate and complete.    Jamyiah Labella Cranford Mon, MD  [facility] [phone] (phone) [phone] (fax)  [place]

## 2022-06-03 ENCOUNTER — Encounter: Payer: Self-pay | Admitting: Family Medicine

## 2022-06-03 ENCOUNTER — Ambulatory Visit (INDEPENDENT_AMBULATORY_CARE_PROVIDER_SITE_OTHER): Payer: Medicare Other | Admitting: Family Medicine

## 2022-06-03 VITALS — BP 123/45 | HR 52 | Ht <= 58 in | Wt 150.0 lb

## 2022-06-03 DIAGNOSIS — E861 Hypovolemia: Secondary | ICD-10-CM | POA: Diagnosis not present

## 2022-06-03 DIAGNOSIS — I2510 Atherosclerotic heart disease of native coronary artery without angina pectoris: Secondary | ICD-10-CM | POA: Diagnosis not present

## 2022-06-03 DIAGNOSIS — I9589 Other hypotension: Secondary | ICD-10-CM

## 2022-06-03 DIAGNOSIS — Z8673 Personal history of transient ischemic attack (TIA), and cerebral infarction without residual deficits: Secondary | ICD-10-CM

## 2022-06-03 DIAGNOSIS — F3341 Major depressive disorder, recurrent, in partial remission: Secondary | ICD-10-CM

## 2022-06-03 DIAGNOSIS — N1831 Chronic kidney disease, stage 3a: Secondary | ICD-10-CM

## 2022-06-03 DIAGNOSIS — I5043 Acute on chronic combined systolic (congestive) and diastolic (congestive) heart failure: Secondary | ICD-10-CM

## 2022-06-03 NOTE — Patient Instructions (Signed)
Decrease Hydralazine Hcl tab to 25 mg. Take two times daily for high blood pressure. Follow up with Dr. Quentin Cornwall in 2 months for blood pressure.

## 2022-06-15 DIAGNOSIS — Z872 Personal history of diseases of the skin and subcutaneous tissue: Secondary | ICD-10-CM | POA: Diagnosis not present

## 2022-06-15 DIAGNOSIS — L218 Other seborrheic dermatitis: Secondary | ICD-10-CM | POA: Diagnosis not present

## 2022-06-15 DIAGNOSIS — L578 Other skin changes due to chronic exposure to nonionizing radiation: Secondary | ICD-10-CM | POA: Diagnosis not present

## 2022-06-15 DIAGNOSIS — L570 Actinic keratosis: Secondary | ICD-10-CM | POA: Diagnosis not present

## 2022-06-16 DIAGNOSIS — H2513 Age-related nuclear cataract, bilateral: Secondary | ICD-10-CM | POA: Diagnosis not present

## 2022-07-10 ENCOUNTER — Encounter: Payer: Self-pay | Admitting: Emergency Medicine

## 2022-07-10 ENCOUNTER — Ambulatory Visit (INDEPENDENT_AMBULATORY_CARE_PROVIDER_SITE_OTHER): Payer: Medicare Other

## 2022-07-10 ENCOUNTER — Ambulatory Visit
Admission: EM | Admit: 2022-07-10 | Discharge: 2022-07-10 | Disposition: A | Discharge status: Home / Self Care | Payer: Medicare Other | Attending: Emergency Medicine | Admitting: Emergency Medicine

## 2022-07-10 DIAGNOSIS — I700 Atherosclerosis of aorta: Secondary | ICD-10-CM | POA: Insufficient documentation

## 2022-07-10 DIAGNOSIS — Z8673 Personal history of transient ischemic attack (TIA), and cerebral infarction without residual deficits: Secondary | ICD-10-CM | POA: Diagnosis not present

## 2022-07-10 DIAGNOSIS — R059 Cough, unspecified: Secondary | ICD-10-CM | POA: Diagnosis present

## 2022-07-10 DIAGNOSIS — Z7952 Long term (current) use of systemic steroids: Secondary | ICD-10-CM | POA: Insufficient documentation

## 2022-07-10 DIAGNOSIS — R42 Dizziness and giddiness: Secondary | ICD-10-CM | POA: Diagnosis not present

## 2022-07-10 DIAGNOSIS — I110 Hypertensive heart disease with heart failure: Secondary | ICD-10-CM | POA: Insufficient documentation

## 2022-07-10 DIAGNOSIS — Z1152 Encounter for screening for COVID-19: Secondary | ICD-10-CM | POA: Insufficient documentation

## 2022-07-10 DIAGNOSIS — J441 Chronic obstructive pulmonary disease with (acute) exacerbation: Secondary | ICD-10-CM | POA: Diagnosis present

## 2022-07-10 DIAGNOSIS — M549 Dorsalgia, unspecified: Secondary | ICD-10-CM

## 2022-07-10 DIAGNOSIS — I509 Heart failure, unspecified: Secondary | ICD-10-CM | POA: Insufficient documentation

## 2022-07-10 DIAGNOSIS — I252 Old myocardial infarction: Secondary | ICD-10-CM | POA: Insufficient documentation

## 2022-07-10 DIAGNOSIS — M1710 Unilateral primary osteoarthritis, unspecified knee: Secondary | ICD-10-CM | POA: Diagnosis not present

## 2022-07-10 LAB — RESP PANEL BY RT-PCR (RSV, FLU A&B, COVID)  RVPGX2
Influenza A by PCR: NEGATIVE
Influenza B by PCR: NEGATIVE
Resp Syncytial Virus by PCR: NEGATIVE
SARS Coronavirus 2 by RT PCR: NEGATIVE

## 2022-07-10 MED ORDER — PREDNISONE 10 MG PO TABS: 40.0000 mg | ORAL_TABLET | Freq: Every day | ORAL | 0 refills | Status: AC

## 2022-07-10 NOTE — ED Provider Notes (Signed)
Stephanie Charles    CSN: [id number] Arrival date & time: 07/10/22  1507      History   Chief Complaint Chief Complaint  Patient presents with   Back Pain   Cough    HPI Stephanie Charles is a 88 y.o. female.  Accompanied by her daughter, patient presents with left upper back pain x2 weeks.  She also reports 2-day history of cough.  No fever, chills, chest pain, shortness of breath, or other symptoms.  She reports history of back pain.  Treatment attempted at home with Biofreeze, albuterol inhaler, and visit to her chiropractor.  She has previously been seen by orthopedics.  Her medical history includes lumbar spondylosis, osteoarthritis, hypertension, MI, heart failure, stroke, vertigo.  The history is provided by the patient, a relative and medical records.    Past Medical History:  Diagnosis Date   Anemia    CHF (congestive heart failure) ([place]) 10/05/2021   Diverticulitis    GERD (gastroesophageal reflux disease)    Hyperlipidemia    Hypertension    Hypothyroidism    Myocardial infarction ([place]) 08/2017   TIA (transient ischemic attack)    1 approx 2015, 1 approx 2018   Vertigo    last episode several months ago   [name]     Patient Active Problem List   Diagnosis Date Noted   Knee pain 02/19/2022   Localized, primary osteoarthritis 02/19/2022   Osteoarthritis of knee 02/19/2022   Diarrhea 02/16/2022   CHF (congestive heart failure) ([place]) 10/05/2021   Near syncope 09/10/2021   Low back pain 04/09/2021   Lumbar spondylosis 04/09/2021   Hypotension 03/13/2021   C. difficile diarrhea 03/11/2021   Pressure injury of skin 03/11/2021   Shortness of breath    Acute on chronic combined systolic and diastolic CHF (congestive heart failure) ([place]) 03/06/2021   Acute on chronic combined systolic and diastolic congestive heart failure ([place]) 03/06/2021   COVID-19 virus infection 03/06/2021   Hypothyroidism    TIA (transient ischemic attack)    CAD  (coronary artery disease)    CKD (chronic kidney disease), stage IIIa    Elevated troponin    Unstable angina ([place]) 06/07/2019   Non-ST elevation (NSTEMI) myocardial infarction ([place])    Chest pain 06/06/2019   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   Diverticulitis 04/13/2018   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 11/12/2017   Ankle edema, bilateral 05/04/2017   Abnormal blood sugar 03/11/2015   Allergic rhinitis 03/11/2015   Absolute anemia 03/11/2015   Arthropathia 03/11/2015   Benign essential HTN 03/11/2015   Benign essential tremor 03/11/2015   Artery disease, cerebral 03/11/2015   Chronic venous insufficiency 03/11/2015   Clinical depression 03/11/2015   Urinary system disease 03/11/2015   Acid reflux 03/11/2015   HLD (hyperlipidemia) 03/11/2015   Adult hypothyroidism 03/11/2015   Irritable colon 03/11/2015   Cannot sleep 03/11/2015   Weak pulse 03/11/2015   Carotid stenosis 03/11/2015   Temporary cerebral vascular dysfunction 03/11/2015   External carotid artery stenosis 07/12/2014    Past Surgical History:  Procedure Laterality Date   COLONOSCOPY WITH PROPOFOL N/A 06/22/2018   Procedure: COLONOSCOPY WITH PROPOFOL;  Surgeon: Jonathon Bellows, MD;  Location: [facility] ENDOSCOPY;  Service: Endoscopy;  Laterality: N/A;   CORONARY STENT INTERVENTION N/A 06/07/2019   Procedure: CORONARY STENT INTERVENTION;  Surgeon: [name], MD;  Location: [place] [facility] LAB;  Service: Cardiovascular;  Laterality: N/A;   ESOPHAGOGASTRODUODENOSCOPY (EGD) WITH PROPOFOL  N/A 06/22/2018   Procedure: ESOPHAGOGASTRODUODENOSCOPY (EGD) WITH PROPOFOL;  Surgeon: Jonathon Bellows, MD;  Location: [facility] ENDOSCOPY;  Service: Endoscopy;  Laterality: N/A;   KNEE ARTHROSCOPY Right    REPLACEMENT TOTAL KNEE BILATERAL     RIGHT/LEFT HEART CATH AND CORONARY ANGIOGRAPHY N/A 06/07/2019   Procedure: RIGHT/LEFT HEART CATH AND CORONARY ANGIOGRAPHY;  Surgeon: [name], MD;  Location: [place] [facility] LAB;  Service: Cardiovascular;  Laterality: N/A;   SHOULDER SURGERY Left     OB History     Gravida  4   Para  4   Term      Preterm      AB      Living         SAB      IAB      Ectopic      Multiple      Live Births               Home Medications    Prior to Admission medications   Medication Sig Start Date End Date Taking? Authorizing Provider  predniSONE (DELTASONE) 10 MG tablet Take 4 tablets (40 mg total) by mouth daily for 5 days. 07/10/22 07/15/22 Yes Sharion Balloon, NP  albuterol (PROVENTIL) (2.5 MG/3ML) 0.083% nebulizer solution USE 1 VIAL IN NEBULIZER EVERY 4 HOURS AS NEEDED 01/04/22   Jerrol Banana., MD  allopurinol (ZYLOPRIM) 300 MG tablet Take 1 tablet by mouth once daily Patient taking differently: Take 300 mg by mouth daily. 12/29/21   Jerrol Banana., MD  amitriptyline (ELAVIL) 10 MG tablet TAKE 1 TABLET BY MOUTH AT BEDTIME Patient taking differently: Take 10 mg by mouth at bedtime. 12/29/21   Jerrol Banana., MD  ASPIRIN LOW DOSE 81 MG EC tablet NEW PRESCRIPTION REQUEST: TAKE ONE TABLET BY MOUTH EVERY DAY Patient taking differently: Take 81 mg by mouth daily. 08/03/21   Jerrol Banana., MD  atorvastatin (LIPITOR) 40 MG tablet Take 1 tablet (40 mg total) by mouth daily. 10/09/21   [name], MD  carvedilol (COREG) 12.5 MG tablet Take 1 tablet (12.5 mg total) by mouth 2 (two) times daily. 10/09/21   [name], MD  clopidogrel (PLAVIX) 75 MG tablet Take 1 tablet (75 mg total) by mouth daily. 10/09/21   [name], MD  Ferrous Sulfate 27 MG TABS Take 27 mg by mouth at bedtime.    [provider]  fidaxomicin (DIFICID) 200 MG TABS tablet Take 200 mg by mouth 2 (two) times daily with a meal. Take 1 tablet (200 mg total) by mouth Two (2) times a day. 02/18/22   [provider]  fluticasone (FLONASE) 50 MCG/ACT nasal spray Place 2 sprays into both nostrils daily. 02/23/22   Jerrol Banana., MD  hydrALAZINE (APRESOLINE) 50 MG tablet Take 50 mg three times a day, For high blood pressure >150 Take extra hydralazine Patient taking differently: Take 50 mg by mouth 3 (three) times daily. Take 50 mg three times a day, For high blood pressure >150 Take extra hydralazine 10/09/21   [name], MD  hydrocortisone 2.5 % cream Apply 1 application topically 2 (two) times daily as needed (for skin irritation). 02/05/21   [provider]  isosorbide mononitrate (IMDUR) 30 MG 24 hr tablet Take 1 tablet (30 mg total) by mouth daily. For high blood pressure >150 can take extra isosorbide 04/09/22   [name], MD  levothyroxine (SYNTHROID)  137 MCG tablet Take 1 tablet (137 mcg total) by mouth daily before breakfast. 04/12/22   Jerrol Banana., MD  Melatonin 10 MG TABS Take 20 mg by mouth at bedtime.    [provider]  mirtazapine (REMERON) 30 MG tablet TAKE 1 TABLET BY MOUTH AT BEDTIME 04/21/22   Jerrol Banana., MD  Multiple Vitamin (MULTIVITAMIN) capsule Take 1 capsule by mouth every evening.    [provider]  nitroGLYCERIN (NITROSTAT) 0.4 MG SL tablet Place 1 tablet (0.4 mg total) under the tongue every 5 (five) minutes as needed for chest pain. 04/09/22   [name], MD  pantoprazole (PROTONIX) 40 MG tablet Take 1 tablet by mouth once daily 04/12/22   Jerrol Banana., MD  potassium chloride (KLOR-CON) 10 MEQ tablet Take potassium 20 meq when you take torsemide Patient taking differently: Take 20 mEq by mouth See admin instructions. Take potassium 20 meq when you take torsemide 10/09/21   Gollan, Kathlene November, MD  sacubitril-valsartan (ENTRESTO) 97-103 MG Take 1 tablet by mouth 2 (two) times daily. 11/19/21   [name], PA-C  sertraline (ZOLOFT) 25 MG tablet Take 1 tablet by mouth once daily 04/21/22   Jerrol Banana., MD  torsemide (DEMADEX) 20 MG tablet Take 1 tablet (20 mg total) by mouth daily as needed. For weight 153  or higher with a potassium tab 04/09/22   [name], MD    Family History Family History  Problem Relation Age of Onset   Heart disease Mother    Hypertension Mother    Stroke Mother    Heart disease Father    Breast cancer Sister    Alzheimer's disease Brother    Heart attack Brother    Stroke Brother    Heart disease Brother    Mental illness Brother        died suicide   Cancer Brother        prostate   Heart disease Brother        atrial fib   Alzheimer's disease Brother    Clotting disorder Daughter    Fibromyalgia Daughter     Social History Social History   Tobacco Use   Smoking status: Never   Smokeless tobacco: Never  Vaping Use   Vaping Use: Never used  Substance Use Topics   Alcohol use: No    Alcohol/week: 0.0 standard drinks of alcohol   Drug use: No     Allergies   Latex, Levofloxacin, Lisinopril, Povidone-iodine, Simvastatin, and Codeine   Review of Systems Review of Systems  Constitutional:  Negative for chills and fever.  HENT:  Negative for ear pain and sore throat.   Respiratory:  Positive for cough. Negative for shortness of breath.   Cardiovascular:  Negative for chest pain and palpitations.  Musculoskeletal:  Positive for back pain.  Neurological:  Negative for weakness and numbness.  All other systems reviewed and are negative.    Physical Exam Triage Vital Signs ED Triage Vitals  Enc Vitals Group     BP 07/10/22 1519 (!) 137/54     Pulse Rate 07/10/22 1519 (!) 55     Resp 07/10/22 1519 18     Temp 07/10/22 1519 97.9 F (36.6 C)     Temp src --      SpO2 07/10/22 1519 93 %     Weight --      Height --      Head Circumference --  Peak Flow --      Pain Score 07/10/22 1523 10     Pain Loc --      Pain Edu? --      Excl. in [place]? --    No data found.  Updated Vital Signs BP (!) 137/54   Pulse (!) 55   Temp 97.9 F (36.6 C)   Resp 18   SpO2 93%   Visual Acuity Right Eye Distance:   Left Eye Distance:    Bilateral Distance:    Right Eye Near:   Left Eye Near:    Bilateral Near:     Physical Exam Vitals and nursing note reviewed.  Constitutional:      General: She is not in acute distress.    Appearance: She is well-developed. She is not ill-appearing.  HENT:     Mouth/Throat:     Mouth: Mucous membranes are moist.  Cardiovascular:     Rate and Rhythm: Normal rate and regular rhythm.     Heart sounds: Normal heart sounds.  Pulmonary:     Effort: Pulmonary effort is normal. No respiratory distress.     Breath sounds: Normal breath sounds.  Musculoskeletal:     Cervical back: Neck supple.  Skin:    General: Skin is warm and dry.  Neurological:     Mental Status: She is alert.  Psychiatric:        Mood and Affect: Mood normal.        Behavior: Behavior normal.      UC Treatments / Results  Labs (all labs ordered are listed, but only abnormal results are displayed) Labs Reviewed  RESP PANEL BY RT-PCR (RSV, FLU A&B, COVID)  [id number]    EKG   Radiology DG Chest 2 View  Result Date: 07/10/2022 CLINICAL DATA:  Cough for 2 days. Back pain. History of bronchitis and congestive heart failure. EXAM: CHEST - 2 VIEW COMPARISON:  Radiographs 10/06/2021, 10/05/2021 and 09/17/2021. CT 02/21/2013. FINDINGS: The heart size and mediastinal contours are stable. There is aortic atherosclerosis. Increased AP diameter of the chest is associated with diffuse central airway thickening, consistent with chronic obstructive pulmonary disease. No superimposed airspace disease, edema, pleural effusion or pneumothorax identified. There are diffuse degenerative changes throughout the spine associated with an exaggerated thoracic kyphosis and a thoracolumbar scoliosis. No acute osseous findings are seen. IMPRESSION: 1. Chronic obstructive pulmonary disease. No evidence of acute cardiopulmonary process. 2. Aortic atherosclerosis. Electronically Signed   By: Richardean Sale M.D.   On: 07/10/2022 15:52     Procedures Procedures (including critical care time)  Medications Ordered in UC Medications - No data to display  Initial Impression / Assessment and Plan / UC Course  I have reviewed the triage vital signs and the nursing notes.  Pertinent labs & imaging results that were available during my care of the patient were reviewed by me and considered in my medical decision making (see chart for details).    Cough, COPD exacerbation, upper back pain.  Chest x-ray shows no acute cardiopulmonary process.  COVID, Flu, RSV pending.  Instructed patient to continue using albuterol.  Also treating with prednisone x5 days.  Instructed patient to follow-up with her PCP on Monday.  Strict ED precautions discussed.  Education provided on COPD exacerbation.  Patient and her daughter agreed to plan of care.   Final Clinical Impressions(s) / UC Diagnoses   Final diagnoses:  Cough, unspecified type  Upper back pain on left side  COPD exacerbation ([place])  Discharge Instructions      Continue to use your albuterol.  Take the prednisone as directed.  Follow-up with your primary care provider on Monday.         ED Prescriptions     Medication Sig Dispense Auth. Provider   predniSONE (DELTASONE) 10 MG tablet Take 4 tablets (40 mg total) by mouth daily for 5 days. 20 tablet Sharion Balloon, NP      I have reviewed the PDMP during this encounter.   Sharion Balloon, NP 07/10/22 [phone]

## 2022-07-10 NOTE — Discharge Instructions (Addendum)
Continue to use your albuterol.  Take the prednisone as directed.  Follow-up with your primary care provider on Monday.

## 2022-07-10 NOTE — ED Triage Notes (Addendum)
Pt reports left upper back pain x 2 weeks. States has tried ice/heat, Engineer, site. Daughter states pt has been recently packing and believes she may have pulled a muscle.   Also endorses a cough x 2 days. Hx of Bronchitis and CHF.

## 2022-07-15 ENCOUNTER — Ambulatory Visit: Payer: Self-pay | Admitting: *Deleted

## 2022-07-15 NOTE — Telephone Encounter (Signed)
Summary: cough   Pt states she was seen at [facility] for a cough and was prescribed a zpack   Pt states she completed the medication yesterday but still have a constant cough   Pt requesting a cb for advice and to discuss otc medication   Please advise     Reason for Disposition  [1] Finished taking antibiotics AND [2] symptoms are BETTER but [3] not completely gone  Answer Assessment - Initial Assessment Questions 1. INFECTION: "What infection is the antibiotic being given for?"     10/7- bronchitis 2. ANTIBIOTIC: "What antibiotic are you taking" "How many times per day?"     Z-pak- as directed 3. DURATION: "When was the antibiotic started?"     10/7- finished last night 4. MAIN CONCERN OR SYMPTOM:  "What is your main concern right now?"     Cough- lose cough 5. BETTER-SAME-WORSE: "Are you getting better, staying the same, or getting worse compared to when you first started the antibiotics?" If getting worse, ask: "In what way?"      Better- cough is lingering 6. FEVER: "Do you have a fever?" If Yes, ask: "What is your temperature, how was it measured, and when did it start?"     no 7. SYMPTOMS: "Are there any other symptoms you're concerned about?" If Yes, ask: "When did it start?"     cough 8. FOLLOW-UP APPOINTMENT: "Do you have a follow-up appointment with your doctor?"     no  Protocols used: Infection on Antibiotic Follow-up Call-A-AH

## 2022-07-15 NOTE — Telephone Encounter (Signed)
  Chief Complaint: persistent cough after antibiotic treatment Symptoms: cough Frequency: 10/7- UC -bronchitis Pertinent Negatives: Patient denies fever, other symptoms Disposition: '[]'$ ED /'[]'$ Urgent Care (no appt availability in office) / '[x]'$ Appointment(In office/virtual)/ '[]'$  [place]/ '[]'$ Home Care/ '[]'$ Refused Recommended Disposition /'[]'$ [place] [facility]/ '[]'$  Follow-up with PCP Additional Notes: Patient states she relies on others for transportation and she does not have a way to come to the office- telephone visit scheduled

## 2022-07-15 NOTE — Progress Notes (Deleted)
I,Stephanie Charles,acting as a Education administrator for [employer], PA-C.,have documented all relevant documentation on the behalf of Mikey Kirschner, PA-C,as directed by  Mikey Kirschner, PA-C while in the presence of Mikey Kirschner, PA-C.   Virtual telephone visit    Virtual Visit via Telephone Note   This visit type was conducted due to national recommendations for restrictions regarding the COVID-19 Pandemic (e.g. social distancing) in an effort to limit this patient's exposure and mitigate transmission in our community. Due to her co-morbid illnesses, this patient is at least at moderate risk for complications without adequate follow up. This format is felt to be most appropriate for this patient at this time. The patient did not have access to video technology or had technical difficulties with video requiring transitioning to audio format only (telephone). Physical exam was limited to content and character of the telephone converstion.    Patient location: *** Provider location: ***  I discussed the limitations of evaluation and management by telemedicine and the availability of in person appointments. The patient expressed understanding and agreed to proceed.   Visit Date: 07/16/2022  Today's healthcare provider: Mikey Kirschner, PA-C   No chief complaint on file.  Subjective    Cough This is a new problem. The current episode started in the past 7 days. The problem has been gradually improving.      Medications: Outpatient Medications Prior to Visit  Medication Sig   albuterol (PROVENTIL) (2.5 MG/3ML) 0.083% nebulizer solution USE 1 VIAL IN NEBULIZER EVERY 4 HOURS AS NEEDED   allopurinol (ZYLOPRIM) 300 MG tablet Take 1 tablet by mouth once daily (Patient taking differently: Take 300 mg by mouth daily.)   amitriptyline (ELAVIL) 10 MG tablet TAKE 1 TABLET BY MOUTH AT BEDTIME (Patient taking differently: Take 10 mg by mouth at bedtime.)   ASPIRIN LOW DOSE 81 MG EC tablet NEW  PRESCRIPTION REQUEST: TAKE ONE TABLET BY MOUTH EVERY DAY (Patient taking differently: Take 81 mg by mouth daily.)   atorvastatin (LIPITOR) 40 MG tablet Take 1 tablet (40 mg total) by mouth daily.   carvedilol (COREG) 12.5 MG tablet Take 1 tablet (12.5 mg total) by mouth 2 (two) times daily.   clopidogrel (PLAVIX) 75 MG tablet Take 1 tablet (75 mg total) by mouth daily.   Ferrous Sulfate 27 MG TABS Take 27 mg by mouth at bedtime.   fidaxomicin (DIFICID) 200 MG TABS tablet Take 200 mg by mouth 2 (two) times daily with a meal. Take 1 tablet (200 mg total) by mouth Two (2) times a day.   fluticasone (FLONASE) 50 MCG/ACT nasal spray Place 2 sprays into both nostrils daily.   hydrALAZINE (APRESOLINE) 50 MG tablet Take 50 mg three times a day, For high blood pressure >150 Take extra hydralazine (Patient taking differently: Take 50 mg by mouth 3 (three) times daily. Take 50 mg three times a day, For high blood pressure >150 Take extra hydralazine)   hydrocortisone 2.5 % cream Apply 1 application topically 2 (two) times daily as needed (for skin irritation).   isosorbide mononitrate (IMDUR) 30 MG 24 hr tablet Take 1 tablet (30 mg total) by mouth daily. For high blood pressure >150 can take extra isosorbide   levothyroxine (SYNTHROID) 137 MCG tablet Take 1 tablet (137 mcg total) by mouth daily before breakfast.   Melatonin 10 MG TABS Take 20 mg by mouth at bedtime.   mirtazapine (REMERON) 30 MG tablet TAKE 1 TABLET BY MOUTH AT BEDTIME   Multiple Vitamin (MULTIVITAMIN) capsule Take  1 capsule by mouth every evening.   nitroGLYCERIN (NITROSTAT) 0.4 MG SL tablet Place 1 tablet (0.4 mg total) under the tongue every 5 (five) minutes as needed for chest pain.   pantoprazole (PROTONIX) 40 MG tablet Take 1 tablet by mouth once daily   potassium chloride (KLOR-CON) 10 MEQ tablet Take potassium 20 meq when you take torsemide (Patient taking differently: Take 20 mEq by mouth See admin instructions. Take potassium 20 meq  when you take torsemide)   predniSONE (DELTASONE) 10 MG tablet Take 4 tablets (40 mg total) by mouth daily for 5 days.   sacubitril-valsartan (ENTRESTO) 97-103 MG Take 1 tablet by mouth 2 (two) times daily.   sertraline (ZOLOFT) 25 MG tablet Take 1 tablet by mouth once daily   torsemide (DEMADEX) 20 MG tablet Take 1 tablet (20 mg total) by mouth daily as needed. For weight 153 or higher with a potassium tab   No facility-administered medications prior to visit.    Review of Systems  Respiratory:  Positive for cough.    {Labs  Heme  Chem  Endocrine  Serology  Results Review (optional):23779}   Objective    There were no vitals taken for this visit. {Show previous vital signs (optional):23777}     Assessment & Plan     ***  No follow-ups on file.    I discussed the assessment and treatment plan with the patient. The patient was provided an opportunity to ask questions and all were answered. The patient agreed with the plan and demonstrated an understanding of the instructions.   The patient was advised to call back or seek an in-person evaluation if the symptoms worsen or if the condition fails to improve as anticipated.  I provided *** minutes of non-face-to-face time during this encounter.  {provider attestation***:1}  Mikey Kirschner, PA-C [facility] [phone] (phone) [phone] (fax)  [place]

## 2022-07-16 ENCOUNTER — Telehealth: Payer: Medicare Other | Admitting: Physician Assistant

## 2022-07-17 ENCOUNTER — Emergency Department
Admission: EM | Admit: 2022-07-17 | Discharge: 2022-07-17 | Discharge status: Against Medical Advice | Payer: Medicare Other

## 2022-08-02 ENCOUNTER — Encounter (INDEPENDENT_AMBULATORY_CARE_PROVIDER_SITE_OTHER): Payer: Self-pay

## 2022-08-05 ENCOUNTER — Ambulatory Visit: Payer: Medicare Other | Admitting: Family Medicine

## 2022-08-19 ENCOUNTER — Other Ambulatory Visit: Payer: Self-pay | Admitting: Family Medicine

## 2022-08-19 DIAGNOSIS — K219 Gastro-esophageal reflux disease without esophagitis: Secondary | ICD-10-CM

## 2022-08-19 MED ORDER — PANTOPRAZOLE SODIUM 40 MG PO TBEC: 40.0000 mg | DELAYED_RELEASE_TABLET | Freq: Every day | ORAL | 0 refills | Status: DC

## 2022-08-19 NOTE — Telephone Encounter (Signed)
Medication Refill - Medication: pantoprazole (PROTONIX) 40 MG tablet   Has the patient contacted their pharmacy? Yes.   Pt was at pharmacy  Preferred Pharmacy (with phone number or street name):  [facility] [place] - [address] Phone: [phone]  Fax: [phone]     Has the patient been seen for an appointment in the last year OR does the patient have an upcoming appointment? Yes.    Agent: Please be advised that RX refills may take up to 3 business days. We ask that you follow-up with your pharmacy.

## 2022-08-19 NOTE — Telephone Encounter (Signed)
Called patient to schedule appt with new provider (transitions of care) since Dr. Rosanna Randy has left practice. Appt needed for medication refills. No answer, LVMTCB [phone].

## 2022-08-19 NOTE — Telephone Encounter (Signed)
Requested medication (s) are due for refill today: yes  Requested medication (s) are on the active medication list: yes  Last refill:  04/12/22 #90 0 refills  Future visit scheduled: no   Notes to clinic:  do you want to refill Rx? Called patient to schedule appt with Dr. Quentin Cornwall and no answer. LVMTCB for transition of care.     Requested Prescriptions  Pending Prescriptions Disp Refills   pantoprazole (PROTONIX) 40 MG tablet 90 tablet 0    Sig: Take 1 tablet (40 mg total) by mouth daily.     Gastroenterology: Proton Pump Inhibitors Passed - 08/19/2022  2:21 PM      Passed - Valid encounter within last 12 months    Recent Outpatient Visits           2 months ago Hypotension due to hypovolemia   [facility] Jerrol Banana., MD   4 months ago Benign essential HTN   [facility] Jerrol Banana., MD   5 months ago C. difficile diarrhea   [facility] Jerrol Banana., MD   8 months ago Encounter for [employer] annual wellness exam   [facility] Jerrol Banana., MD   8 months ago Recurrent Clostridioides difficile diarrhea   [facility] Jerrol Banana., MD

## 2022-09-02 ENCOUNTER — Other Ambulatory Visit: Payer: Self-pay

## 2022-10-11 ENCOUNTER — Other Ambulatory Visit: Payer: Self-pay | Admitting: Cardiovascular Disease

## 2022-10-11 DIAGNOSIS — Z8673 Personal history of transient ischemic attack (TIA), and cerebral infarction without residual deficits: Secondary | ICD-10-CM

## 2022-10-22 DIAGNOSIS — M545 Low back pain, unspecified: Secondary | ICD-10-CM | POA: Diagnosis not present

## 2022-11-03 DIAGNOSIS — H02132 Senile ectropion of right lower eyelid: Secondary | ICD-10-CM | POA: Diagnosis not present

## 2022-11-03 DIAGNOSIS — H10503 Unspecified blepharoconjunctivitis, bilateral: Secondary | ICD-10-CM | POA: Diagnosis not present

## 2022-11-04 DIAGNOSIS — I10 Essential (primary) hypertension: Secondary | ICD-10-CM | POA: Diagnosis not present

## 2022-11-04 DIAGNOSIS — I25118 Atherosclerotic heart disease of native coronary artery with other forms of angina pectoris: Secondary | ICD-10-CM | POA: Diagnosis not present

## 2022-11-04 DIAGNOSIS — N1831 Chronic kidney disease, stage 3a: Secondary | ICD-10-CM | POA: Diagnosis not present

## 2022-11-04 DIAGNOSIS — I509 Heart failure, unspecified: Secondary | ICD-10-CM | POA: Diagnosis not present

## 2022-11-04 DIAGNOSIS — I130 Hypertensive heart and chronic kidney disease with heart failure and stage 1 through stage 4 chronic kidney disease, or unspecified chronic kidney disease: Secondary | ICD-10-CM | POA: Diagnosis not present

## 2022-11-04 DIAGNOSIS — E785 Hyperlipidemia, unspecified: Secondary | ICD-10-CM | POA: Diagnosis not present

## 2022-11-04 DIAGNOSIS — I2510 Atherosclerotic heart disease of native coronary artery without angina pectoris: Secondary | ICD-10-CM | POA: Diagnosis not present

## 2022-11-04 DIAGNOSIS — Z8673 Personal history of transient ischemic attack (TIA), and cerebral infarction without residual deficits: Secondary | ICD-10-CM | POA: Diagnosis not present

## 2022-11-08 NOTE — Progress Notes (Unsigned)
Cardiology Office Note  Date:  11/09/2022   ID:  [name], DOB Oct 16, 1929, MRN [id number]  PCP:  Stephanie Post, MD   Chief Complaint  Patient presents with   6 month follow up     Patient would like to discuss having a carotid ultrasound. Medications reviewed by the patient verbally.     HPI:  [name] is a 89 y.o. female with history of  demand ischemia with hypertensive urgency,  LBBB,  TIA/CVA in 2016,  CKD stage II,  HTN,  HLD,  carotid artery disease,  hypothyroidism,  anxiety, depression,  Coronary disease, stent placed to proximal to mid LAD  ejection fraction 35 to 40%, repeat 09/2019: 40 to 45% ECHO: January 2023 ejection fraction 40% Who presents for follow-up of her ischemic cardiomyopathy, PAD  Last seen in clinic July 2023  Recent fall, right hip pain, followed by [facility] Severe pain in the Am, when she wakes up Worse in past 2 days, pain is severe Pain in the hip gets better as the day goes on  BP at home 140/58 Elevated today in setting of pain On recheck blood pressure continues to run high 643 systolic Has not been to continue hydralazine  Denies significant chest pain or shortness of breath   C.diff better after long course of antibiotics 02/2022  EKG personally reviewed by myself on todays visit Sinus bradycardia rate 53 bpm left bundle branch block  Other past medical history reviewed Echocardiogram   1. Frequent PVCs during study.  Left ventricular ejection  fraction, by estimation, is 40%.  paradoxical septal  motion due to known LBBB. There is moderate concentric left ventricular  hypertrophy.   3. Right ventricular systolic function is normal. The right ventricular  size is normal.   4. Left atrial size was severely dilated.   Covid, in hospital,March 06 2021 BP elevated on arrival to the hospital Creatinine 0.9 on arrival,  Treated with diuretic for shortness of breath, concern for CHF Sent home 03/10/2021,d/c  with CR 1.6, BUN 92, above her baseline  After discharge, had episode of syncope on toilet in the setting of diarrhea on 03/11/21 Presented to the hospital again with dehydration multiple episodes of profuse watery diarrhea bradycardic into the 40s, given 0.5 mg of atropine in the ER Creatinine 2.12, BUN 100 Given IV fluids  hospital September 2020, ejection fraction 35 to 40% She had symptoms of chest pain and accelerated HTN found to have elevated high sensitivity troponin peaking at 329 and acute systolic CHF.    cardiac catheterization, with severe proximal to mid LAD disease, stent placed  Follow-up echocardiogram December 2020 ejection fraction 40 up to 45% Tolerating Entresto, carvedilol, HCTZ Reports blood pressure stable at home  PMH:   has a past medical history of Anemia, CHF (congestive heart failure) ([place]) (10/05/2021), Diverticulitis, GERD (gastroesophageal reflux disease), Hyperlipidemia, Hypertension, Hypothyroidism, Myocardial infarction ([place]) (08/2017), TIA (transient ischemic attack), Vertigo, and [name].  PSH:    Past Surgical History:  Procedure Laterality Date   COLONOSCOPY WITH PROPOFOL N/A 06/22/2018   Procedure: COLONOSCOPY WITH PROPOFOL;  Surgeon: Stephanie Bellows, MD;  [facility]: [facility] ENDOSCOPY;  Service: Endoscopy;  Laterality: N/A;   CORONARY STENT INTERVENTION N/A 06/07/2019   Procedure: CORONARY STENT INTERVENTION;  Surgeon: Stephanie Hampshire, MD;  Location: [place] CV LAB;  Service: Cardiovascular;  Laterality: N/A;   ESOPHAGOGASTRODUODENOSCOPY (EGD) WITH PROPOFOL N/A 06/22/2018   Procedure: ESOPHAGOGASTRODUODENOSCOPY (EGD) WITH PROPOFOL;  Surgeon: Stephanie Charles,  Stephanie Mech, MD;  Location: [facility] ENDOSCOPY;  Service: Endoscopy;  Laterality: N/A;   KNEE ARTHROSCOPY Right    REPLACEMENT TOTAL KNEE BILATERAL     RIGHT/LEFT HEART CATH AND CORONARY ANGIOGRAPHY N/A 06/07/2019   Procedure: RIGHT/LEFT HEART CATH AND CORONARY ANGIOGRAPHY;  Surgeon: Stephanie Merritts, MD;  Location: [place] [facility];  Service: Cardiovascular;  Laterality: N/A;   SHOULDER SURGERY Left     Current Outpatient Medications  Medication Sig Dispense Refill   albuterol (PROVENTIL) (2.5 MG/3ML) 0.083% nebulizer solution USE 1 VIAL IN NEBULIZER EVERY 4 HOURS AS NEEDED 180 mL 6   allopurinol (ZYLOPRIM) 300 MG tablet Take 1 tablet by mouth once daily 90 tablet 3   amitriptyline (ELAVIL) 10 MG tablet TAKE 1 TABLET BY MOUTH AT BEDTIME 90 tablet 3   ASPIRIN LOW DOSE 81 MG EC tablet NEW PRESCRIPTION REQUEST: TAKE ONE TABLET BY MOUTH EVERY DAY 90 tablet 3   atorvastatin (LIPITOR) 40 MG tablet Take 1 tablet (40 mg total) by mouth daily. 90 tablet 3   carvedilol (COREG) 12.5 MG tablet Take 1 tablet by mouth twice daily 180 tablet 0   clopidogrel (PLAVIX) 75 MG tablet Take 1 tablet by mouth once daily 90 tablet 0   Ferrous Sulfate 27 MG TABS Take 27 mg by mouth at bedtime.     fluticasone (FLONASE) 50 MCG/ACT nasal spray Place 2 sprays into both nostrils daily. 16 g 6   hydrALAZINE (APRESOLINE) 50 MG tablet Take 50 mg three times a day, For high blood pressure >150 Take extra hydralazine 300 tablet 3   hydrochlorothiazide (HYDRODIURIL) 25 MG tablet Take 1 tablet by mouth daily.     hydrocortisone 2.5 % cream Apply 1 application topically 2 (two) times daily as needed (for skin irritation).     isosorbide mononitrate (IMDUR) 30 MG 24 hr tablet Take 1 tablet (30 mg total) by mouth daily. For high blood pressure >150 can take extra isosorbide 100 tablet 3   levothyroxine (SYNTHROID) 137 MCG tablet Take 1 tablet (137 mcg total) by mouth daily before breakfast. 90 tablet 3   losartan (COZAAR) 100 MG tablet Take 100 mg by mouth daily.     Melatonin 10 MG TABS Take 20 mg by mouth at bedtime.     mirtazapine (REMERON) 30 MG tablet TAKE 1 TABLET BY MOUTH AT BEDTIME 90 tablet 3   Multiple Vitamin (MULTIVITAMIN) capsule Take 1 capsule by mouth every evening.     nitroGLYCERIN (NITROSTAT) 0.4 MG  SL tablet Place 1 tablet (0.4 mg total) under the tongue every 5 (five) minutes as needed for chest pain. 25 tablet 3   pantoprazole (PROTONIX) 40 MG tablet Take 1 tablet (40 mg total) by mouth daily. 90 tablet 0   potassium chloride (KLOR-CON) 10 MEQ tablet Take potassium 20 meq when you take torsemide (Patient taking differently: Take 20 mEq by mouth See admin instructions. Take potassium 20 meq when you take torsemide) 90 tablet 3   sacubitril-valsartan (ENTRESTO) 97-103 MG Take 1 tablet by mouth 2 (two) times daily. 180 tablet 3   sertraline (ZOLOFT) 25 MG tablet Take 1 tablet by mouth once daily 90 tablet 3   torsemide (DEMADEX) 20 MG tablet Take 1 tablet (20 mg total) by mouth daily as needed. For weight 153 or higher with a potassium tab 90 tablet 3   fidaxomicin (DIFICID) 200 MG TABS tablet Take 200 mg by mouth 2 (two) times daily with a meal. Take 1 tablet (200 mg total) by  mouth Two (2) times a day. (Patient not taking: Reported on 11/09/2022)     No current facility-administered medications for this visit.    Allergies:   Codeine, Levofloxacin, Lisinopril, Other, Povidone-iodine, Simvastatin, and Latex   Social History:  The patient  reports that she has never smoked. She has never used smokeless tobacco. She reports that she does not drink alcohol and does not use drugs.   Family History:   family history includes Alzheimer's disease in her brother and brother; Breast cancer in her sister; Cancer in her brother; Clotting disorder in her daughter; Fibromyalgia in her daughter; Heart attack in her brother; Heart disease in her brother, brother, father, and mother; Hypertension in her mother; Mental illness in her brother; Stroke in her brother and mother.    Review of Systems: Review of Systems  Constitutional: Negative.   HENT: Negative.    Respiratory: Negative.    Cardiovascular: Negative.   Gastrointestinal: Negative.   Musculoskeletal: Negative.   Neurological: Negative.    Psychiatric/Behavioral: Negative.    All other systems reviewed and are negative.   PHYSICAL EXAM: VS:  BP (!) 170/70 (BP Location: Left Arm, Patient Position: Sitting, Cuff Size: Normal)   Pulse (!) 53   Ht '5\' 2"'$  (1.575 m)   Wt 148 lb 8 oz (67.4 kg)   SpO2 96%   BMI 27.16 kg/m  , BMI Body mass index is 27.16 kg/m. Constitutional:  oriented to person, place, and time. No distress.  HENT:  Head: Grossly normal Eyes:  no discharge. No scleral icterus.  Neck: No JVD, 2+ carotid bruit on the right Cardiovascular: Regular rate and rhythm, no murmurs appreciated Pulmonary/Chest: Clear to auscultation bilaterally, no wheezes or rails Abdominal: Soft.  no distension.  no tenderness.  Musculoskeletal: Normal range of motion Neurological:  normal muscle tone. Coordination normal. No atrophy Skin: Skin warm and dry Psychiatric: normal affect, pleasant  Recent Labs: 12/15/2021: TSH 0.620 02/23/2022: ALT 17; BUN 13; Creatinine, Ser 1.12; Hemoglobin 10.3; Platelets 166; Potassium 5.1; Sodium 145   Lipid Panel Lab Results  Component Value Date   CHOL 115 12/15/2021   HDL 29 (L) 12/15/2021   LDLCALC 53 12/15/2021   TRIG 201 (H) 12/15/2021    Wt Readings from Last 3 Encounters:  11/09/22 148 lb 8 oz (67.4 kg)  06/03/22 150 lb (68 kg)  04/21/22 152 lb (68.9 kg)     ASSESSMENT AND PLAN:  Problem List Items Addressed This Visit       Cardiology Problems   Benign essential HTN   Relevant Medications   hydrochlorothiazide (HYDRODIURIL) 25 MG tablet   losartan (COZAAR) 100 MG tablet   Acute on chronic combined systolic and diastolic congestive heart failure (HCC)   Relevant Medications   hydrochlorothiazide (HYDRODIURIL) 25 MG tablet   losartan (COZAAR) 100 MG tablet   HLD (hyperlipidemia)   Relevant Medications   hydrochlorothiazide (HYDRODIURIL) 25 MG tablet   losartan (COZAAR) 100 MG tablet   CAD (coronary artery disease) - Primary   Relevant Medications    hydrochlorothiazide (HYDRODIURIL) 25 MG tablet   losartan (COZAAR) 100 MG tablet   Artery disease, cerebral   Relevant Medications   hydrochlorothiazide (HYDRODIURIL) 25 MG tablet   losartan (COZAAR) 100 MG tablet   Carotid stenosis   Relevant Medications   hydrochlorothiazide (HYDRODIURIL) 25 MG tablet   losartan (COZAAR) 100 MG tablet     Other   CKD (chronic kidney disease), stage IIIa   History of embolic stroke without residual  deficits   Other Visit Diagnoses     History of syncope         Ischemic cardiomyopathy Tolerating Entresto, carvedilol, hydralazine  Appears that losartan is on her list, we will take this off her list as she is on Entresto Patient is since application completed for Entresto  torsemide 20 mg as needed for weight 153 pounds or higher with potassium 20 as needed Appears euvolemic on today's visit  CAD with stable angina Stent placed to her proximal to mid LAD Ejection fraction 40 to 45%, unchanged from 2020 Currently with no symptoms of angina. No further workup at this time. Continue current medication regimen.  Carotid bruit Mild disease bilaterally, less than 39% She is requesting repeat ultrasound bruit on the right  Hyperlipidemia On Lipitor 40 daily Cholesterol at goal  Chronic renal sufficiency stage III Creatinine 2.0 BUN 61 potassium 5.2 Losartan held, for unclear reasons was taking losartan and Entresto   Total encounter time more than 30 minutes  Greater than 50% was spent in counseling and coordination of care with the patient   Signed, Esmond Plants, M.D., Ph.D. [place], [place]

## 2022-11-09 ENCOUNTER — Encounter: Payer: Self-pay | Admitting: Cardiovascular Disease

## 2022-11-09 ENCOUNTER — Ambulatory Visit: Payer: Medicare Other | Attending: Cardiovascular Disease | Admitting: Cardiovascular Disease

## 2022-11-09 ENCOUNTER — Telehealth: Payer: Self-pay

## 2022-11-09 VITALS — BP 160/58 | HR 53 | Ht 62.0 in | Wt 148.5 lb

## 2022-11-09 DIAGNOSIS — I10 Essential (primary) hypertension: Secondary | ICD-10-CM

## 2022-11-09 DIAGNOSIS — M545 Low back pain, unspecified: Secondary | ICD-10-CM | POA: Diagnosis not present

## 2022-11-09 DIAGNOSIS — I2510 Atherosclerotic heart disease of native coronary artery without angina pectoris: Secondary | ICD-10-CM | POA: Diagnosis not present

## 2022-11-09 DIAGNOSIS — Z8673 Personal history of transient ischemic attack (TIA), and cerebral infarction without residual deficits: Secondary | ICD-10-CM | POA: Diagnosis not present

## 2022-11-09 DIAGNOSIS — M47896 Other spondylosis, lumbar region: Secondary | ICD-10-CM | POA: Diagnosis not present

## 2022-11-09 DIAGNOSIS — Z87898 Personal history of other specified conditions: Secondary | ICD-10-CM | POA: Diagnosis not present

## 2022-11-09 DIAGNOSIS — M13851 Other specified arthritis, right hip: Secondary | ICD-10-CM | POA: Diagnosis not present

## 2022-11-09 DIAGNOSIS — I5043 Acute on chronic combined systolic (congestive) and diastolic (congestive) heart failure: Secondary | ICD-10-CM

## 2022-11-09 DIAGNOSIS — E782 Mixed hyperlipidemia: Secondary | ICD-10-CM

## 2022-11-09 DIAGNOSIS — N183 Chronic kidney disease, stage 3 unspecified: Secondary | ICD-10-CM

## 2022-11-09 DIAGNOSIS — I6523 Occlusion and stenosis of bilateral carotid arteries: Secondary | ICD-10-CM

## 2022-11-09 DIAGNOSIS — M4186 Other forms of scoliosis, lumbar region: Secondary | ICD-10-CM | POA: Diagnosis not present

## 2022-11-09 DIAGNOSIS — I679 Cerebrovascular disease, unspecified: Secondary | ICD-10-CM

## 2022-11-09 NOTE — Telephone Encounter (Signed)
While patient was in the office for her visit with Dr. Rockey Situ. She brought [name] the Provider application for her [facility] patient assistance.   Filled out. Signed by MD.   Patient told me to fax that to the company.   Provider application has been faxed.

## 2022-11-09 NOTE — Patient Instructions (Addendum)
   Medication Instructions:  Stop losartan Stay on entresto  For pressure >150, take a hydralazine as needed  If you need a refill on your cardiac medications before your next appointment, please call your pharmacy.   Lab work: No new labs needed  Testing/Procedures:  Carotid ultrasound for stenosis  Follow-Up: At [facility], you and your health needs are our priority.  As part of our continuing mission to provide you with exceptional heart care, we have created designated Provider Care Teams.  These Care Teams include your primary Cardiologist (physician) and Advanced Practice Providers (APPs -  Physician Assistants and Nurse Practitioners) who all work together to provide you with the care you need, when you need it.  You will need a follow up appointment in 6 months  Providers on your designated Care Team:   Murray Hodgkins, NP Christell Faith, PA-C Cadence Kathlen Mody, [place]  COVID-19 Vaccine Information can be found at: ShippingScam.co.uk For questions related to vaccine distribution or appointments, please email [email or web address] or call [phone].

## 2022-11-10 ENCOUNTER — Telehealth: Payer: Self-pay | Admitting: Cardiovascular Disease

## 2022-11-10 NOTE — Telephone Encounter (Signed)
Fax received from [employer] patient assistance for [facility] stating they had received the physician portion of the application, but were missing: - Proof of income - Insurance Information - Patient's signature - Household size  Per phone note from 11/09/22: November 09, 2022 [name], [place]      11/09/22 12:22 PM Note While patient was in the office for her visit with Dr. Rockey Situ. She brought [name] the Provider application for her Stephanie Charles patient assistance.    Filled out. Signed by MD.    Patient told me to fax that to the company.    Provider application has been faxed.        I called the patient and inquired if she had the patient portion of the application that she was sending. She advised "I just got it in the mail yesterday" and will complete it today. She will have her daughter fax the patient portion of the application to [employer] this week.  The patient was appreciative of the call.

## 2022-11-19 DIAGNOSIS — M545 Low back pain, unspecified: Secondary | ICD-10-CM | POA: Diagnosis not present

## 2022-11-23 ENCOUNTER — Telehealth: Payer: Self-pay | Admitting: Family Medicine

## 2022-11-23 NOTE — Telephone Encounter (Signed)
[employer] to schedule their annual wellness visit. Patient declined to schedule AWV at this time. Patient transferred care to Dr.Gibert's new office.  [place] Direct Dial: [phone]

## 2022-11-25 DIAGNOSIS — M48061 Spinal stenosis, lumbar region without neurogenic claudication: Secondary | ICD-10-CM | POA: Diagnosis not present

## 2022-11-25 DIAGNOSIS — M5416 Radiculopathy, lumbar region: Secondary | ICD-10-CM | POA: Diagnosis not present

## 2022-12-09 ENCOUNTER — Telehealth: Payer: Self-pay | Admitting: Cardiovascular Disease

## 2022-12-09 NOTE — Telephone Encounter (Signed)
New Message:      Patient said she need to talk to the nurse about her application for  patient assistance for  her Entresto. She said she put the wrong date on it.

## 2022-12-09 NOTE — Telephone Encounter (Signed)
Called and spoke with patient. She expressed frustration with her application for assistance. She accidentally put the wrong date on the application and they will not accept it at this time. Advised that she could come by the office, sign a document, and we can make copy of her social security statement. Reviewed that once all that information has been done we will fax to company and that should help. She was appreciative for the call with no further questions at this time.

## 2022-12-10 ENCOUNTER — Other Ambulatory Visit: Payer: Self-pay | Admitting: *Deleted

## 2022-12-10 DIAGNOSIS — I2510 Atherosclerotic heart disease of native coronary artery without angina pectoris: Secondary | ICD-10-CM

## 2022-12-10 DIAGNOSIS — I10 Essential (primary) hypertension: Secondary | ICD-10-CM

## 2022-12-10 DIAGNOSIS — I5043 Acute on chronic combined systolic (congestive) and diastolic (congestive) heart failure: Secondary | ICD-10-CM

## 2022-12-10 DIAGNOSIS — N183 Chronic kidney disease, stage 3 unspecified: Secondary | ICD-10-CM

## 2022-12-10 MED ORDER — SACUBITRIL-VALSARTAN 97-103 MG PO TABS: 1.0000 | ORAL_TABLET | Freq: Two times a day (BID) | ORAL | 1 refills | Status: DC

## 2022-12-10 MED ORDER — SACUBITRIL-VALSARTAN 97-103 MG PO TABS: 1.0000 | ORAL_TABLET | Freq: Two times a day (BID) | ORAL | 3 refills | Status: DC

## 2022-12-10 NOTE — Telephone Encounter (Signed)
Patient came to office and dropped off her assistance paperwork. Advised her that I would get this sent off and hope she will get it soon. She was appreciative and no further needs.

## 2022-12-13 NOTE — Telephone Encounter (Signed)
Application completed, faxed, and filed.  

## 2022-12-13 NOTE — Telephone Encounter (Signed)
Called [employer] patent assistance company and patient was approved today for assistance. She states that they should be giving her a call to verify address for shipment.

## 2022-12-13 NOTE — Telephone Encounter (Signed)
Patient is calling to get update on application. Was informed it was completed, faxed and filed and still would like a call.

## 2022-12-13 NOTE — Telephone Encounter (Signed)
Spoke with patient and reviewed that she was approved today and that they would give her a call to confirm address for shipment of her medication. She was appreciative for the call with no further questions at this time.

## 2022-12-13 NOTE — Telephone Encounter (Signed)
Spoke with patient and reviewed that application was sent and we are pending their review. Advised that I would give them a call to see if they have it and then give her a call back with updates. She was appreciative for the time.

## 2022-12-23 ENCOUNTER — Other Ambulatory Visit: Payer: Self-pay | Admitting: Family Medicine

## 2022-12-23 DIAGNOSIS — K219 Gastro-esophageal reflux disease without esophagitis: Secondary | ICD-10-CM

## 2022-12-26 ENCOUNTER — Other Ambulatory Visit: Payer: Self-pay | Admitting: Family Medicine

## 2022-12-26 DIAGNOSIS — K219 Gastro-esophageal reflux disease without esophagitis: Secondary | ICD-10-CM

## 2022-12-27 ENCOUNTER — Telehealth: Payer: Self-pay | Admitting: Family Medicine

## 2022-12-27 ENCOUNTER — Other Ambulatory Visit: Payer: Self-pay

## 2022-12-27 DIAGNOSIS — K219 Gastro-esophageal reflux disease without esophagitis: Secondary | ICD-10-CM

## 2022-12-27 MED ORDER — PANTOPRAZOLE SODIUM 40 MG PO TBEC: 40.0000 mg | DELAYED_RELEASE_TABLET | Freq: Every day | ORAL | 0 refills | Status: DC

## 2022-12-27 MED ORDER — AMITRIPTYLINE HCL 10 MG PO TABS: 10.0000 mg | ORAL_TABLET | Freq: Every day | ORAL | 3 refills | Status: AC

## 2022-12-27 NOTE — Telephone Encounter (Signed)
[facility] requesting prescription refill amitriptyline (ELAVIL) 10 MG tablet  Please advise

## 2022-12-27 NOTE — Telephone Encounter (Signed)
[facility] requesting prescription refill pantoprazole (PROTONIX) 40 MG tablet  Please advise

## 2022-12-28 ENCOUNTER — Ambulatory Visit: Payer: Medicare Other | Attending: Cardiovascular Disease

## 2022-12-28 DIAGNOSIS — I6523 Occlusion and stenosis of bilateral carotid arteries: Secondary | ICD-10-CM | POA: Diagnosis not present

## 2023-01-05 DIAGNOSIS — M48061 Spinal stenosis, lumbar region without neurogenic claudication: Secondary | ICD-10-CM | POA: Diagnosis not present

## 2023-01-05 DIAGNOSIS — M5416 Radiculopathy, lumbar region: Secondary | ICD-10-CM | POA: Diagnosis not present

## 2023-01-09 ENCOUNTER — Other Ambulatory Visit: Payer: Self-pay | Admitting: Cardiovascular Disease

## 2023-01-21 ENCOUNTER — Other Ambulatory Visit: Payer: Self-pay | Admitting: Cardiovascular Disease

## 2023-01-22 ENCOUNTER — Other Ambulatory Visit: Payer: Self-pay | Admitting: Cardiovascular Disease

## 2023-01-22 DIAGNOSIS — Z8673 Personal history of transient ischemic attack (TIA), and cerebral infarction without residual deficits: Secondary | ICD-10-CM

## 2023-01-31 DIAGNOSIS — H02202 Unspecified lagophthalmos right lower eyelid: Secondary | ICD-10-CM | POA: Diagnosis not present

## 2023-01-31 DIAGNOSIS — H02205 Unspecified lagophthalmos left lower eyelid: Secondary | ICD-10-CM | POA: Diagnosis not present

## 2023-01-31 DIAGNOSIS — H16143 Punctate keratitis, bilateral: Secondary | ICD-10-CM | POA: Diagnosis not present

## 2023-02-03 DIAGNOSIS — E785 Hyperlipidemia, unspecified: Secondary | ICD-10-CM | POA: Diagnosis not present

## 2023-02-03 DIAGNOSIS — Z8673 Personal history of transient ischemic attack (TIA), and cerebral infarction without residual deficits: Secondary | ICD-10-CM | POA: Diagnosis not present

## 2023-02-03 DIAGNOSIS — Z8719 Personal history of other diseases of the digestive system: Secondary | ICD-10-CM | POA: Diagnosis not present

## 2023-02-03 DIAGNOSIS — I509 Heart failure, unspecified: Secondary | ICD-10-CM | POA: Diagnosis not present

## 2023-02-03 DIAGNOSIS — N1832 Chronic kidney disease, stage 3b: Secondary | ICD-10-CM | POA: Diagnosis not present

## 2023-02-03 DIAGNOSIS — I130 Hypertensive heart and chronic kidney disease with heart failure and stage 1 through stage 4 chronic kidney disease, or unspecified chronic kidney disease: Secondary | ICD-10-CM | POA: Diagnosis not present

## 2023-02-03 DIAGNOSIS — I25118 Atherosclerotic heart disease of native coronary artery with other forms of angina pectoris: Secondary | ICD-10-CM | POA: Diagnosis not present

## 2023-02-03 DIAGNOSIS — I10 Essential (primary) hypertension: Secondary | ICD-10-CM | POA: Diagnosis not present

## 2023-02-08 NOTE — Progress Notes (Unsigned)
Cardiology Office Note    Date:  02/09/2023   ID:  Stephanie Charles, DOB Oct 10, 1929, MRN [id number]  PCP:  Bosie Clos, MD  Cardiologist:  Julien Nordmann, MD  Electrophysiologist:  None   Chief Complaint: Evaluation of elevated blood pressure and medication review for tremor  History of Present Illness:   Stephanie Charles is a 89 y.o. female with history of CAD with prior NSTEMI in 2016 as detailed below with NSTEMI in 06/2019 status post PCI/DES to the LAD, HFrEF secondary to ICM, labile HTN, syncope felt to be vasovagal, TIA/posterior CVA, LBBB, COVID in 03/2021 requiring hospital admission, mild bilateral carotid artery disease, hypothyroidism, diverticulitis, C. difficile, CKD stage IIIb, anemia, gout, anxiety, depression, and GERD who presents for evaluation of elevated blood pressure and medication review for tremor.  She was previously followed by Dr. Lady Gary, transitioning her care to [facility] during her 06/2019 admission.  Prior Holter from 2017 showed NSR with occasional PVCs and PACs with NSVT/WCT without sustained arrhythmias. Stress testing in 12/2016 was negative for significant ischemia with normal LVSF. Echo in 12/2016 showed normal LVSF with an EF of 55% and mild MR/TR.  She was admitted to [facility] in 06/2019 with chest pain and palpitations in the setting of poorly controlled BP with systolic in the 200s mmHg. Echo showed an EF of 35-40%, diffuse HK, normal RVSF and cavity size, moderately dilated left atrium. During the study, frequent PVCs were noted. High-sensitivity troponin peaked at 296. She underwent R/LHC that showed mid LAD 95% stenosis s/p successful PCI/DES, D2 30% stenosis. RHC showed mean RA pressure of 8, RV pressure 32/7 with a mean of 9, PA pressure 31/16 with a mean of 21, PCWP 10, CO/CI 3.57/2.02.  Echo in 09/2019 demonstrated an EF of 40 to 45%, moderate LVH, abnormal septal wall motion consistent with known LBBB, grade 1 diastolic dysfunction, global hypokinesis,  moderately reduced RV systolic function with normal ventricular cavity size and mildly increased wall thickness, moderately dilated left atrium, trivial mitral regurgitation, mild pulmonic regurgitation, and an estimated right atrial pressure of 8 mmHg.  She was admitted to the hospital in 03/2021 twice.  Initially with COVID-pneumonia and possible acute on chronic combined systolic and diastolic CHF in the context of accelerated hypertension.  Echo demonstrated an improved LV systolic function with an EF of 50 to 55%, moderate LVH, normal RV systolic function and ventricular cavity size, moderately dilated left atrium, and mild aortic valve sclerosis without evidence of stenosis.  She was readmitted a day after her discharge with nausea, vomiting, and diarrhea with associated syncope while sitting on the toilet.  Note indicates that she was bradycardic into the 40s bpm and hypotensive in the ER.  She was given 1 dose of atropine.  She had AKI and hypotension which was felt to be due to volume depletion.  She was treated with IV fluids, antiemetics, and steroids.  She was admitted in 09/2021 with near syncope, felt to be in the setting of ongoing diarrhea secondary to C. diff with volume depletion.  CT head showed no acute intracranial pathology.  Lower extremity ultrasound was negative for DVT bilaterally.  Her admission was notable for an episode of bradycardia following a BM.  Echo in 10/2021 demonstrated an EF of 40%, global hypokinesis, wall motion abnormality secondary to known left bundle branch block, moderate concentric LVH, normal RV systolic function and ventricular cavity size, severely dilated left atrium, trivial mitral regurgitation, aortic valve sclerosis without evidence of stenosis,  right atrial pressure estimated at 3 mmHg, and frequent PVCs noted during the study.  Outpatient cardiac monitoring at that time demonstrated a predominant rhythm of sinus with an average rate of 70 bpm (range of 41 185  bpm), bundle branch block, 1 run of NSVT lasting 8 beats, 2 episodes of SVT with the fastest and longest interval lasting 9 beats, junctional rhythm, occasional PACs representing a 1.9% burden, rare atrial couplets and triplets, occasional PVCs representing a 3.4% burden with rare ventricular couplets and triplets.  She was most recently seen in the office in 11/2022 and was without symptoms of angina or cardiac decompensation.  Noted a recent fall with right hip pain and was followed by orthopedics.  Most recent carotid artery ultrasound from 12/2022 demonstrated 1 to 39% bilateral ICA stenosis with antegrade flow of the bilateral vertebral arteries.  She comes in accompanied by her daughter today and is without symptoms of angina or cardiac decompensation.  They have questions regarding progression of her tremor.  They are wondering if any of her cardiac meds may be contributing to this.  No dyspnea, palpitations, dizziness, presyncope, or syncope.  She has noted some bilateral ankle edema, though does not believe she has been taking her HCTZ.  She has not needed any as needed furosemide or as needed hydralazine.  No progressive orthopnea or early satiety.  Weight stable when reviewing her last office visit in 11/2022.  Patient and daughter are confused when reviewing outside medication list and our medication list.   Labs independently reviewed: 02/2023 - potassium 4.3, BUN 28, serum creatinine 1.5 11/2022 - TC 110, TG 187, HDL 31, LDL 47, TSH normal, Hgb 10.5, PLT 137, AST/ALT normal 07/2022 - magnesium 2.0  Past Medical History:  Diagnosis Date   Anemia    CHF (congestive heart failure) (HCC) 10/05/2021   Diverticulitis    GERD (gastroesophageal reflux disease)    Hyperlipidemia    Hypertension    Hypothyroidism    Myocardial infarction (HCC) 08/2017   TIA (transient ischemic attack)    1 approx 2015, 1 approx 2018   Vertigo    last episode several months ago   [name]      Past Surgical History:  Procedure Laterality Date   COLONOSCOPY WITH PROPOFOL N/A 06/22/2018   Procedure: COLONOSCOPY WITH PROPOFOL;  Surgeon: Wyline Mood, MD;  Location: [facility] ENDOSCOPY;  Service: Endoscopy;  Laterality: N/A;   CORONARY STENT INTERVENTION N/A 06/07/2019   Procedure: CORONARY STENT INTERVENTION;  Surgeon: Iran Ouch, MD;  Location: [facility] INVASIVE CV LAB;  Service: Cardiovascular;  Laterality: N/A;   ESOPHAGOGASTRODUODENOSCOPY (EGD) WITH PROPOFOL N/A 06/22/2018   Procedure: ESOPHAGOGASTRODUODENOSCOPY (EGD) WITH PROPOFOL;  Surgeon: Wyline Mood, MD;  Location: [facility] ENDOSCOPY;  Service: Endoscopy;  Laterality: N/A;   KNEE ARTHROSCOPY Right    REPLACEMENT TOTAL KNEE BILATERAL     RIGHT/LEFT HEART CATH AND CORONARY ANGIOGRAPHY N/A 06/07/2019   Procedure: RIGHT/LEFT HEART CATH AND CORONARY ANGIOGRAPHY;  Surgeon: Antonieta Iba, MD;  Location: [facility] INVASIVE CV LAB;  Service: Cardiovascular;  Laterality: N/A;   SHOULDER SURGERY Left     Current Medications: Current Meds  Medication Sig   albuterol (PROVENTIL) (2.5 MG/3ML) 0.083% nebulizer solution USE 1 VIAL IN NEBULIZER EVERY 4 HOURS AS NEEDED   allopurinol (ZYLOPRIM) 300 MG tablet Take 1 tablet by mouth once daily   amitriptyline (ELAVIL) 10 MG tablet Take 1 tablet (10 mg total) by mouth at bedtime.   ASPIRIN LOW DOSE  81 MG EC tablet NEW PRESCRIPTION REQUEST: TAKE ONE TABLET BY MOUTH EVERY DAY   atorvastatin (LIPITOR) 40 MG tablet Take 1 tablet by mouth once daily   carvedilol (COREG) 12.5 MG tablet Take 1 tablet by mouth twice daily   clopidogrel (PLAVIX) 75 MG tablet Take 1 tablet by mouth once daily   Ferrous Sulfate 27 MG TABS Take 27 mg by mouth at bedtime.   hydrALAZINE (APRESOLINE) 50 MG tablet Take 50 mg three times a day, For high blood pressure >150 Take extra hydralazine   hydrocortisone 2.5 % cream Apply 1 application topically 2 (two) times daily as needed (for skin irritation).   isosorbide mononitrate  (IMDUR) 30 MG 24 hr tablet Take 1 tablet (30 mg total) by mouth daily. For high blood pressure >150 can take extra isosorbide   levothyroxine (SYNTHROID) 137 MCG tablet Take 1 tablet (137 mcg total) by mouth daily before breakfast.   Melatonin 10 MG TABS Take 20 mg by mouth at bedtime.   mirtazapine (REMERON) 30 MG tablet TAKE 1 TABLET BY MOUTH AT BEDTIME   Multiple Vitamin (MULTIVITAMIN) capsule Take 1 capsule by mouth every evening.   nitroGLYCERIN (NITROSTAT) 0.4 MG SL tablet Place 1 tablet (0.4 mg total) under the tongue every 5 (five) minutes as needed for chest pain.   pantoprazole (PROTONIX) 40 MG tablet Take 1 tablet (40 mg total) by mouth daily.   potassium chloride (KLOR-CON) 10 MEQ tablet Take potassium 20 meq when you take torsemide (Patient taking differently: Take 20 mEq by mouth See admin instructions. Take potassium 20 meq when you take torsemide)   sacubitril-valsartan (ENTRESTO) 97-103 MG Take 1 tablet by mouth 2 (two) times daily.   sertraline (ZOLOFT) 25 MG tablet Take 1 tablet by mouth once daily   torsemide (DEMADEX) 20 MG tablet Take 1 tablet (20 mg total) by mouth daily as needed. For weight 153 or higher with a potassium tab   [DISCONTINUED] hydrochlorothiazide (HYDRODIURIL) 25 MG tablet Take 1 tablet by mouth daily.    Allergies:   Codeine, Levofloxacin, Lisinopril, Other, Povidone-iodine, Simvastatin, and Latex   Social History   Socioeconomic History   Marital status: Divorced    Spouse name: na   Number of children: 4   Years of education: 14   Highest education level: Associate degree: occupational, Scientist, product/process development, or vocational program  Occupational History   Occupation: retired  Tobacco Use   Smoking status: Never   Smokeless tobacco: Never  Vaping Use   Vaping Use: Never used  Substance and Sexual Activity   Alcohol use: No    Alcohol/week: 0.0 standard drinks of alcohol   Drug use: No   Sexual activity: Never  Other Topics Concern   Not on file   Social History Narrative   Not on file   Social Determinants of Health   Financial Resource Strain: Low Risk  (08/18/2020)   Overall Financial Resource Strain (CARDIA)    Difficulty of Paying Living Expenses: Not hard at all  Food Insecurity: No Food Insecurity (08/18/2020)   Hunger Vital Sign    Worried About Running Out of Food in the Last Year: Never true    Ran Out of Food in the Last Year: Never true  Transportation Needs: No Transportation Needs (08/18/2020)   PRAPARE - Administrator, Civil Service (Medical): No    Lack of Transportation (Non-Medical): No  Physical Activity: Inactive (08/18/2020)   Exercise Vital Sign    Days of Exercise per Week: 0  days    Minutes of Exercise per Session: 0 min  Stress: No Stress Concern Present (08/18/2020)   [employer] of Occupational Health - Occupational Stress Questionnaire    Feeling of Stress : Not at all  Social Connections: Socially Isolated (08/18/2020)   Social Connection and Isolation Panel [NHANES]    Frequency of Communication with Friends and Family: More than three times a week    Frequency of Social Gatherings with Friends and Family: More than three times a week    Attends Religious Services: Never    Database administrator or Organizations: No    Attends Engineer, structural: Never    Marital Status: Divorced     Family History:  The patient's family history includes Alzheimer's disease in her brother and brother; Breast cancer in her sister; Cancer in her brother; Clotting disorder in her daughter; Fibromyalgia in her daughter; Heart attack in her brother; Heart disease in her brother, brother, father, and mother; Hypertension in her mother; Mental illness in her brother; Stroke in her brother and mother.  ROS:   12-point review of systems is negative unless otherwise noted in the HPI.   EKGs/Labs/Other Studies Reviewed:    Studies reviewed were summarized above. The additional studies  were reviewed today:  Carotid artery ultrasound 12/28/2022: Summary:  Right Carotid: Velocities in the right ICA are consistent with a 1-39%  stenosis.  Non-hemodynamically significant plaque <50% noted in the  CCA. The ECA appears >50% stenosed. Tortuous CCA.   Left Carotid: Velocities in the left ICA are consistent with a 1-39%  stenosis.  Non-hemodynamically significant plaque <50% noted in the  CCA. The ECA appears <50% stenosed.   Vertebrals:  Bilateral vertebral arteries demonstrate antegrade flow.  Subclavians: Bilateral subclavian artery flow was disturbed.  __________  Luci [facility] patch 09/2022: Normal sinus rhythm Patient had a min HR of 41 bpm, max HR of 185 bpm, and avg HR of 70 bpm.    Bundle Branch Block/IVCD was present.  1 run of Ventricular Tachycardia occurred lasting 8 beats with a max rate of 185 bpm (avg 170  bpm).    2 Supraventricular Tachycardia runs occurred, the run with the fastest interval lasting 9 beats with a max rate of 133 bpm (avg 96 bpm); the run with the fastest interval was also the longest.    Junctional Rhythm was present.    Isolated SVEs were occasional (1.9%, 23596), SVE Couplets were rare (<1.0%, 1783), and SVE Triplets were rare (<1.0%, 296).    Isolated VEs were occasional (3.4%, 42235), VE Couplets were rare (<1.0%, 1465), and VE Triplets were rare (<1.0%, 1). Ventricular Bigeminy and Trigeminy were present.   No patient triggered events recorded __________  2D echo 10/07/2021: 1. Frequent PVCs during study.   2. Difficult to determine LVEF due to very frequent ectopy and limited  echo windows. Based on images with no ectopy, left ventricular ejection  fraction, by estimation, is 40%. The left ventricle has  mildly-to-moderately decreased function. The left  ventricle demonstrates global hypokinesis. There is paradoxical septal  motion due to known LBBB. There is moderate concentric left ventricular  hypertrophy. Left ventricular diastolic  parameters are indeterminate.   3. Right ventricular systolic function is normal. The right ventricular  size is normal.   4. Left atrial size was severely dilated.   5. The mitral valve is grossly normal. Trivial mitral valve  regurgitation.   6. The aortic valve is tricuspid. There is mild calcification of the  aortic valve. There is mild thickening of the aortic valve. Aortic valve  regurgitation is not visualized. Aortic valve sclerosis/calcification is  present, without any evidence of  aortic stenosis.   7. The inferior vena cava is normal in size with greater than 50%  respiratory variability, suggesting right atrial pressure of 3 mmHg.  __________  2D echo 03/07/2021: 1. Left ventricular ejection fraction, by estimation, is 50 to 55%. The  left ventricle has low normal function. Left ventricular endocardial  border not optimally defined to evaluate regional wall motion. There is  moderate left ventricular hypertrophy.  Indeterminate diastolic filling due to E-A fusion.   2. Right ventricular systolic function is normal. The right ventricular  size is normal. Tricuspid regurgitation signal is inadequate for assessing  PA pressure.   3. Left atrial size was moderately dilated.   4. The mitral valve is abnormal. No evidence of mitral valve  regurgitation. No evidence of mitral stenosis.   5. The aortic valve has an indeterminant number of cusps. There is mild  thickening of the aortic valve. Aortic valve regurgitation is not  visualized. Mild aortic valve sclerosis is present, with no evidence of  aortic valve stenosis. __________   Carotid artery ultrasound 07/2020: Summary:  Right Carotid: Velocities in the right ICA are consistent with a 1-39%  stenosis.  Non-hemodynamically significant plaque <50% noted in the  CCA.  The ECA appears >50% stenosed.   Left Carotid: Velocities in the left ICA are consistent with a 1-39%  stenosis.  Non-hemodynamically significant plaque  <50% noted in the  CCA.  The ECA appears <50% stenosed.   Vertebrals:  Bilateral vertebral arteries demonstrate antegrade flow.  Subclavians: Left subclavian artery was stenotic. Normal flow hemodynamics  were seen in the right subclavian artery.  __________   2D echo 09/2019: 1. Left ventricular ejection fraction, by visual estimation, is 40 to  45%. The left ventricle has moderately decreased function. There is  moderately increased left ventricular hypertrophy.   2. Abnormal septal motion consistent with left bundle branch block.   3. Left ventricular diastolic parameters are consistent with Grade I  diastolic dysfunction (impaired relaxation).   4. The left ventricle demonstrates global hypokinesis.   5. Global right ventricle has moderately reduced systolic function.The  right ventricular size is normal. Mildly increased right ventricular wall  thickness.   6. Left atrial size was moderately dilated.   7. Right atrial size was not well visualized.   8. Presence of pericardial fat pad.   9. The mitral valve was not well visualized. Trivial mitral valve  regurgitation. No evidence of mitral stenosis.  10. The tricuspid valve is not well visualized. Tricuspid valve  regurgitation is not demonstrated.  11. The aortic valve is tricuspid. Aortic valve regurgitation is not  visualized. No evidence of aortic valve sclerosis or stenosis.  12. Pulmonic regurgitation is mild.  13. The pulmonic valve was not well visualized. Pulmonic valve  regurgitation is mild.  14. TR signal is inadequate for assessing pulmonary artery systolic  pressure.  15. The inferior vena cava is dilated in size with >50% respiratory  variability, suggesting right atrial pressure of 8 mmHg.  16. The interatrial septum was not well visualized. __________   [facility] with PCI in 06/07/2019: Mid LAD lesion is 95% stenosed. Post intervention, there is a 0% residual stenosis. A drug-eluting stent was successfully  placed using a STENT RESOLUTE ONYX 2.5X22. 2nd Diag lesion is 30% stenosed.   Successful angioplasty and drug-eluting stent  placement to the mid LAD.   Recommendations: Dual antiplatelet therapy for at least 1 year. Blood pressure control and cardiac rehab.  Continue treatment of risk factors.       Coronary angiography:  Coronary dominance: Right  Left mainstem:  Large vessel that bifurcates into the LAD and left circumflex, no significant disease noted  Left anterior descending (LAD):   Large vessel that extends to the apical region, diagonal branch 2 of moderate size, proximal to mid LAD prior to large diagonal with 90% stenosis, hazy, heavily calcified.  Left circumflex (LCx):  Large vessel with OM branch 2, no significant disease noted, mild to moderate proximal to mid disease estimated 40%  Right coronary artery (RCA):  Right dominant vessel with PL and PDA, no significant disease noted, very mild proximal disease estimated 30%  Left ventriculography: Aortic valve crossed for pressures, no significant there is no significant mitral regurgitation , no significant aortic valve stenosis Frequent ectopy noted  RA pressures mean of 8 RV pressure 32/7 mean 9 PA pressure 31/16 mean 21 Wedge pressure 10 Cardiac output 3.57 Cardiac index 2.02  Final Conclusions:   Culprit vessel for presentation is the proximal to mid LAD estimated at 90% Case discussed with Dr. Kirke Corin, he will attempt PCI   Recommendations:  pci as above Cardiomyopathy meds, will make changes __________   2D echo 06/2019: 1. The left ventricle has moderately reduced systolic function, with an  ejection fraction of 35-40%. The cavity size was normal. There is  moderately increased left ventricular wall thickness. Left ventricular  diastolic Doppler parameters are  indeterminate. Left ventricular diffuse hypokinesis.   2. The right ventricle has normal systolic function. The cavity was  normal. There is  no increase in right ventricular wall thickness.Unable to  estimate RVSP   3. Frequent PVCs.   4. Left atrial size was moderately dilated.   EKG:  EKG is not ordered today.    Recent Labs: 02/23/2022: ALT 17; BUN 13; Creatinine, Ser 1.12; Hemoglobin 10.3; Platelets 166; Potassium 5.1; Sodium 145  Recent Lipid Panel    Component Value Date/Time   CHOL 115 12/15/2021 1014   CHOL 120 10/19/2011 0323   TRIG 201 (H) 12/15/2021 1014   TRIG 152 10/19/2011 0323   HDL 29 (L) 12/15/2021 1014   HDL 15 (L) 10/19/2011 0323   CHOLHDL 4.0 12/15/2021 1014   CHOLHDL 3.6 03/07/2021 0514   VLDL 17 03/07/2021 0514   VLDL 30 10/19/2011 0323   LDLCALC 53 12/15/2021 1014   LDLCALC 75 10/19/2011 0323    PHYSICAL EXAM:    VS:  BP (!) 158/82   Pulse (!) 58   Ht 5' (1.524 m)   Wt 147 lb 9.6 oz (67 kg)   SpO2 94%   BMI 28.83 kg/m   BMI: Body mass index is 28.83 kg/m.  Physical Exam Vitals reviewed.  Constitutional:      Appearance: She is well-developed.  HENT:     Head: Normocephalic and atraumatic.  Eyes:     General:        Right eye: No discharge.        Left eye: No discharge.  Neck:     Vascular: No JVD.  Cardiovascular:     Rate and Rhythm: Normal rate and regular rhythm.     Pulses:          Carotid pulses are  on the right side with bruit.    Heart sounds: Normal heart sounds, S1 normal and  S2 normal. Heart sounds not distant. No midsystolic click and no opening snap. No murmur heard.    No friction rub.  Pulmonary:     Effort: Pulmonary effort is normal. No respiratory distress.     Breath sounds: Normal breath sounds. No decreased breath sounds, wheezing or rales.  Chest:     Chest wall: No tenderness.  Abdominal:     General: There is no distension.     Tenderness: There is no abdominal tenderness.  Musculoskeletal:     Cervical back: Normal range of motion.     Comments: Trivial bilateral pretibial edema.  Compression socks noted.  Skin:    General: Skin is warm  and dry.     Nails: There is no clubbing.  Neurological:     Mental Status: She is alert and oriented to person, place, and time.     Comments: Mild tremor noted.  Psychiatric:        Speech: Speech normal.        Behavior: Behavior normal.        Thought Content: Thought content normal.        Judgment: Judgment normal.     Wt Readings from Last 3 Encounters:  02/09/23 147 lb 9.6 oz (67 kg)  11/09/22 148 lb 8 oz (67.4 kg)  06/03/22 150 lb (68 kg)     ASSESSMENT & PLAN:   CAD involving the native coronary arteries without angina: She is without symptoms of angina or cardiac decompensation.  Continue aggressive risk factor modification and secondary prevention including aspirin, clopidogrel, atorvastatin, carvedilol, and Imdur.  No indication for further ischemic testing at this time.  HFrEF secondary to ICM: She is euvolemic and well compensated.  Continue current medical therapy including carvedilol, Entresto, and as needed torsemide.  Not on SGLT2 inhibitor or MRA secondary to underlying renal dysfunction.  Labile HTN: Blood pressure 158/82 on recheck.  Would aim for slightly permissive blood pressure of less than 160 mmHg systolic.  Continue carvedilol, Imdur, Entresto, and restart HCTZ.  Follow-up BMP 1 week after reinitiating HCTZ on a regular basis.  HLD: LDL 47 in 11/2022.  She remains on atorvastatin 40 mg.  CKD stage IIIb: Renal function stable to slightly improved on most recent check earlier this month.  Follow-up with PCP/nephrology as directed.  Carotid artery disease: Carotid artery ultrasound from 12/2022 demonstrated 1 to 39% bilateral ICA stenosis.  She remains on aspirin and statin.  Medication management: There are numerous discrepancies between medications and directions between medication list and PCP list.  Patient is not certain exactly what medications she is taking.  I have asked the patient's daughter to go and review medications the patient is currently  taking and provide [name] with an update.  B12 and p.o.) that was our last and review for any off target effects regarding her cardiac meds.  Would also forward this list to her PCPs office for updating as well.  Tremor: Will review cardiac meds, otherwise follow-up with PCP.  Consider neurology evaluation.   Disposition: F/u with Dr. Mariah Milling in 4 months.   Medication Adjustments/Labs and Tests Ordered: Current medicines are reviewed at length with the patient today.  Concerns regarding medicines are outlined above. Medication changes, Labs and Tests ordered today are summarized above and listed in the Patient Instructions accessible in Encounters.   Signed, Eula Listen, PA-C 02/09/2023 4:31 PM     [facility] [place] [address] [place], [place] [address] [phone]

## 2023-02-09 ENCOUNTER — Telehealth: Payer: Self-pay | Admitting: Cardiovascular Disease

## 2023-02-09 ENCOUNTER — Ambulatory Visit: Payer: Medicare Other | Attending: Physician Assistant | Admitting: Physician Assistant

## 2023-02-09 ENCOUNTER — Encounter: Payer: Self-pay | Admitting: Physician Assistant

## 2023-02-09 VITALS — BP 158/82 | HR 58 | Ht 60.0 in | Wt 147.6 lb

## 2023-02-09 DIAGNOSIS — Z79899 Other long term (current) drug therapy: Secondary | ICD-10-CM

## 2023-02-09 DIAGNOSIS — E785 Hyperlipidemia, unspecified: Secondary | ICD-10-CM | POA: Diagnosis not present

## 2023-02-09 DIAGNOSIS — I5020 Unspecified systolic (congestive) heart failure: Secondary | ICD-10-CM

## 2023-02-09 DIAGNOSIS — I6523 Occlusion and stenosis of bilateral carotid arteries: Secondary | ICD-10-CM

## 2023-02-09 DIAGNOSIS — R0989 Other specified symptoms and signs involving the circulatory and respiratory systems: Secondary | ICD-10-CM | POA: Diagnosis not present

## 2023-02-09 DIAGNOSIS — I2510 Atherosclerotic heart disease of native coronary artery without angina pectoris: Secondary | ICD-10-CM | POA: Diagnosis not present

## 2023-02-09 DIAGNOSIS — R251 Tremor, unspecified: Secondary | ICD-10-CM

## 2023-02-09 DIAGNOSIS — N1832 Chronic kidney disease, stage 3b: Secondary | ICD-10-CM

## 2023-02-09 DIAGNOSIS — I255 Ischemic cardiomyopathy: Secondary | ICD-10-CM

## 2023-02-09 MED ORDER — HYDROCHLOROTHIAZIDE 25 MG PO TABS: 25.0000 mg | ORAL_TABLET | Freq: Every day | ORAL | 3 refills | Status: DC

## 2023-02-09 NOTE — Patient Instructions (Signed)
Medication Instructions:  Your physician has recommended you make the following change in your medication:   START - hydrochlorothiazide (HYDRODIURIL) 25 MG tablet -  Take 1 tablet (25 mg total) by mouth daily  *If you need a refill on your cardiac medications before your next appointment, please call your pharmacy*   Lab Work: Your physician recommends that you return for lab work in 1 week: BMP  Medical Mall Entrance at [facility] 1st desk on the right to check in (REGISTRATION)  Lab hours: Monday- Friday (7:30 am- 5:30 pm)   If you have labs (blood work) drawn today and your tests are completely normal, you will receive your results only by: MyChart Message (if you have MyChart) OR A paper copy in the mail If you have any lab test that is abnormal or we need to change your treatment, we will call you to review the results.   Testing/Procedures: -None ordered   Follow-Up: At [facility], you and your health needs are our priority.  As part of our continuing mission to provide you with exceptional heart care, we have created designated Provider Care Teams.  These Care Teams include your primary Cardiologist (physician) and Advanced Practice Providers (APPs -  Physician Assistants and Nurse Practitioners) who all work together to provide you with the care you need, when you need it.  We recommend signing up for the patient portal called "MyChart".  Sign up information is provided on this After Visit Summary.  MyChart is used to connect with patients for Virtual Visits (Telemedicine).  Patients are able to view lab/test results, encounter notes, upcoming appointments, etc.  Non-urgent messages can be sent to your provider as well.   To learn more about what you can do with MyChart, go to [email or web address].    Your next appointment:   4 month(s)  Provider:   Julien Nordmann, MD    Other Instructions -None

## 2023-02-09 NOTE — Telephone Encounter (Signed)
Called to schedule 4 month fu with dr Mariah Milling per checkout 02/09/23 LMOV

## 2023-02-18 ENCOUNTER — Telehealth: Payer: Self-pay | Admitting: Physician Assistant

## 2023-02-18 ENCOUNTER — Other Ambulatory Visit
Admission: RE | Admit: 2023-02-18 | Discharge: 2023-02-18 | Disposition: A | Discharge status: Home / Self Care | Payer: Medicare Other | Attending: Physician Assistant | Admitting: Physician Assistant

## 2023-02-18 ENCOUNTER — Telehealth: Payer: Self-pay | Admitting: *Deleted

## 2023-02-18 DIAGNOSIS — H02205 Unspecified lagophthalmos left lower eyelid: Secondary | ICD-10-CM | POA: Diagnosis not present

## 2023-02-18 DIAGNOSIS — E785 Hyperlipidemia, unspecified: Secondary | ICD-10-CM

## 2023-02-18 DIAGNOSIS — N1832 Chronic kidney disease, stage 3b: Secondary | ICD-10-CM | POA: Diagnosis not present

## 2023-02-18 DIAGNOSIS — I5020 Unspecified systolic (congestive) heart failure: Secondary | ICD-10-CM

## 2023-02-18 DIAGNOSIS — H2513 Age-related nuclear cataract, bilateral: Secondary | ICD-10-CM | POA: Diagnosis not present

## 2023-02-18 DIAGNOSIS — K219 Gastro-esophageal reflux disease without esophagitis: Secondary | ICD-10-CM

## 2023-02-18 DIAGNOSIS — H02209 Unspecified lagophthalmos unspecified eye, unspecified eyelid: Secondary | ICD-10-CM | POA: Diagnosis not present

## 2023-02-18 DIAGNOSIS — H16143 Punctate keratitis, bilateral: Secondary | ICD-10-CM | POA: Diagnosis not present

## 2023-02-18 LAB — BASIC METABOLIC PANEL
Anion gap: 7 (ref 5–15)
BUN: 48 mg/dL — ABNORMAL HIGH (ref 8–23)
CO2: 27 mmol/L (ref 22–32)
Calcium: 9.2 mg/dL (ref 8.9–10.3)
Chloride: 104 mmol/L (ref 98–111)
Creatinine, Ser: 2.29 mg/dL — ABNORMAL HIGH (ref 0.44–1.00)
GFR, Estimated: 20 mL/min — ABNORMAL LOW (ref >60)
Glucose, Bld: 101 mg/dL — ABNORMAL HIGH (ref 70–99)
Potassium: 4.9 mmol/L (ref 3.5–5.1)
Sodium: 138 mmol/L (ref 135–145)

## 2023-02-18 MED ORDER — PANTOPRAZOLE SODIUM 40 MG PO TBEC: 40.0000 mg | DELAYED_RELEASE_TABLET | Freq: Every day | ORAL | 3 refills | Status: DC

## 2023-02-18 NOTE — Telephone Encounter (Signed)
Reviewed results and recommendations with patient. She states that she only takes that fluid pill unless her weight is greater than 153. She verbalized understanding of results, medications, and further recommendations. She had no further questions at this time.

## 2023-02-18 NOTE — Telephone Encounter (Signed)
Pt dropped off her updated medication list that was requested. Placed in nurse box.

## 2023-02-18 NOTE — Telephone Encounter (Signed)
-----   Message from Sondra Barges, PA-C sent at 02/18/2023  2:18 PM EDT ----- Labs show worsening kidney function when compared to most recent values in Epic and Care Everywhere.  Please ensure she is not taking torsemide and KCl on a daily basis at this time given decline in kidney function.  With worsening kidney function, would also recommend she hold hydrochlorothiazide.  She also may need to consider holding PPI as this can lead to renal dysfunction.  Recommend she follow-up with PCP/nephrology within the next week to further assess renal dysfunction and to weigh in on ongoing medical management.

## 2023-02-18 NOTE — Telephone Encounter (Signed)
Med list updated in chart and given to provider for review

## 2023-02-24 DIAGNOSIS — R300 Dysuria: Secondary | ICD-10-CM | POA: Diagnosis not present

## 2023-02-24 DIAGNOSIS — I25118 Atherosclerotic heart disease of native coronary artery with other forms of angina pectoris: Secondary | ICD-10-CM | POA: Diagnosis not present

## 2023-02-24 DIAGNOSIS — I509 Heart failure, unspecified: Secondary | ICD-10-CM | POA: Diagnosis not present

## 2023-02-24 DIAGNOSIS — N390 Urinary tract infection, site not specified: Secondary | ICD-10-CM | POA: Diagnosis not present

## 2023-02-24 DIAGNOSIS — Z8673 Personal history of transient ischemic attack (TIA), and cerebral infarction without residual deficits: Secondary | ICD-10-CM | POA: Diagnosis not present

## 2023-02-24 DIAGNOSIS — N1832 Chronic kidney disease, stage 3b: Secondary | ICD-10-CM | POA: Diagnosis not present

## 2023-03-24 ENCOUNTER — Encounter: Payer: Self-pay | Admitting: Physician Assistant

## 2023-03-24 NOTE — Telephone Encounter (Signed)
Error

## 2023-04-13 ENCOUNTER — Other Ambulatory Visit: Payer: Self-pay | Admitting: Cardiovascular Disease

## 2023-04-13 DIAGNOSIS — Z8673 Personal history of transient ischemic attack (TIA), and cerebral infarction without residual deficits: Secondary | ICD-10-CM

## 2023-04-29 DIAGNOSIS — R0602 Shortness of breath: Secondary | ICD-10-CM | POA: Diagnosis not present

## 2023-04-29 DIAGNOSIS — R197 Diarrhea, unspecified: Secondary | ICD-10-CM | POA: Diagnosis not present

## 2023-04-29 DIAGNOSIS — M25471 Effusion, right ankle: Secondary | ICD-10-CM | POA: Diagnosis not present

## 2023-04-29 DIAGNOSIS — I440 Atrioventricular block, first degree: Secondary | ICD-10-CM | POA: Diagnosis not present

## 2023-04-29 DIAGNOSIS — D509 Iron deficiency anemia, unspecified: Secondary | ICD-10-CM | POA: Diagnosis not present

## 2023-04-29 DIAGNOSIS — K5732 Diverticulitis of large intestine without perforation or abscess without bleeding: Secondary | ICD-10-CM | POA: Diagnosis not present

## 2023-04-29 DIAGNOSIS — K5792 Diverticulitis of intestine, part unspecified, without perforation or abscess without bleeding: Secondary | ICD-10-CM | POA: Diagnosis not present

## 2023-04-29 DIAGNOSIS — N289 Disorder of kidney and ureter, unspecified: Secondary | ICD-10-CM | POA: Diagnosis not present

## 2023-04-29 DIAGNOSIS — I130 Hypertensive heart and chronic kidney disease with heart failure and stage 1 through stage 4 chronic kidney disease, or unspecified chronic kidney disease: Secondary | ICD-10-CM | POA: Diagnosis not present

## 2023-04-29 DIAGNOSIS — R9431 Abnormal electrocardiogram [ECG] [EKG]: Secondary | ICD-10-CM | POA: Diagnosis not present

## 2023-04-29 DIAGNOSIS — R933 Abnormal findings on diagnostic imaging of other parts of digestive tract: Secondary | ICD-10-CM | POA: Diagnosis not present

## 2023-04-29 DIAGNOSIS — M25472 Effusion, left ankle: Secondary | ICD-10-CM | POA: Diagnosis not present

## 2023-04-29 DIAGNOSIS — R0789 Other chest pain: Secondary | ICD-10-CM | POA: Diagnosis not present

## 2023-04-29 DIAGNOSIS — I5020 Unspecified systolic (congestive) heart failure: Secondary | ICD-10-CM | POA: Diagnosis not present

## 2023-04-29 DIAGNOSIS — R1032 Left lower quadrant pain: Secondary | ICD-10-CM | POA: Diagnosis not present

## 2023-04-29 DIAGNOSIS — I2510 Atherosclerotic heart disease of native coronary artery without angina pectoris: Secondary | ICD-10-CM | POA: Diagnosis not present

## 2023-04-29 DIAGNOSIS — I454 Nonspecific intraventricular block: Secondary | ICD-10-CM | POA: Diagnosis not present

## 2023-04-29 DIAGNOSIS — R079 Chest pain, unspecified: Secondary | ICD-10-CM | POA: Diagnosis not present

## 2023-04-29 DIAGNOSIS — N184 Chronic kidney disease, stage 4 (severe): Secondary | ICD-10-CM | POA: Diagnosis not present

## 2023-04-30 DIAGNOSIS — R079 Chest pain, unspecified: Secondary | ICD-10-CM | POA: Diagnosis not present

## 2023-05-02 ENCOUNTER — Telehealth: Payer: Self-pay | Admitting: Cardiovascular Disease

## 2023-05-02 NOTE — Telephone Encounter (Signed)
Returned the call to the patient. She stated that she went to the ED on Friday for chest pain. The pain did subside after an hour. Notes are in CareEverywhere. The patient denies chest pain since that episode.   She stated that she has also been having bilateral swelling in her ankles and feet. The swelling gets better overnight. She has been advised to try and elevate her legs when she is sitting down. She denies shortness of breath.   She has Torsemide 20 mg once daily for weight gain over 153 pounds. Current weight is 147lbs.   Her daughter, Ander Slade, stated that the ED said she was having seeping from her her right foot due to the swelling and wanted her to reach out to cardiology for advise on the diuretic.

## 2023-05-02 NOTE — Telephone Encounter (Signed)
Pt c/o swelling/edema: STAT if pt has developed SOB within 24 hours  If swelling, where is the swelling located?   Both ankles and feet  How much weight have you gained and in what time span?   No  Have you gained 2 pounds in a day or 5 pounds in a week?   No  Do you have a log of your daily weights (if so, list)?  Yes  147 lbs - 7/26  Are you currently taking a fluid pill?  No fluid pills taken today  Are you currently SOB?  No  Have you traveled recently in a car or plane for an extended period of time?   Patient stated she went to [facility], [place] recently  Patient stated she recently had an ED visit as fluid was seeping out of her big toe.  Patient stated she has had a lot of stress recently and wants to know next steps.

## 2023-05-04 DIAGNOSIS — I509 Heart failure, unspecified: Secondary | ICD-10-CM | POA: Diagnosis not present

## 2023-05-04 DIAGNOSIS — I25118 Atherosclerotic heart disease of native coronary artery with other forms of angina pectoris: Secondary | ICD-10-CM | POA: Diagnosis not present

## 2023-05-04 DIAGNOSIS — N1832 Chronic kidney disease, stage 3b: Secondary | ICD-10-CM | POA: Diagnosis not present

## 2023-05-04 DIAGNOSIS — Z8619 Personal history of other infectious and parasitic diseases: Secondary | ICD-10-CM | POA: Diagnosis not present

## 2023-05-04 DIAGNOSIS — G8929 Other chronic pain: Secondary | ICD-10-CM | POA: Diagnosis not present

## 2023-05-04 DIAGNOSIS — M546 Pain in thoracic spine: Secondary | ICD-10-CM | POA: Diagnosis not present

## 2023-05-04 DIAGNOSIS — M79672 Pain in left foot: Secondary | ICD-10-CM | POA: Diagnosis not present

## 2023-05-05 DIAGNOSIS — B372 Candidiasis of skin and nail: Secondary | ICD-10-CM | POA: Diagnosis not present

## 2023-05-05 DIAGNOSIS — L918 Other hypertrophic disorders of the skin: Secondary | ICD-10-CM | POA: Diagnosis not present

## 2023-05-05 DIAGNOSIS — L821 Other seborrheic keratosis: Secondary | ICD-10-CM | POA: Diagnosis not present

## 2023-05-06 ENCOUNTER — Other Ambulatory Visit: Payer: Self-pay | Admitting: Cardiovascular Disease

## 2023-05-09 NOTE — Telephone Encounter (Signed)
Called patient, advised of message from MD.  Patient continues to state that weight is about 150lbs.  Patient aware of instructions. She will do this and let us know of any updates.   Will keep appointment scheduled 09/17

## 2023-06-17 ENCOUNTER — Other Ambulatory Visit: Payer: Self-pay | Admitting: Cardiovascular Disease

## 2023-06-20 NOTE — Progress Notes (Unsigned)
Cardiology Office Note  Date:  06/21/2023   ID:  Stephanie Charles, DOB 08-05-1930, MRN [id number]  PCP:  Bosie Clos, MD   Chief Complaint  Patient presents with   Follow-up    Patient has had some lower extremity swelling requiring diuretic,alongwith bilateral tingling in the feet.    HPI:  Stephanie Charles is a 89 y.o. female with history of  demand ischemia with hypertensive urgency,  LBBB,  TIA/CVA in 2016,  CKD stage II,  HTN,  HLD,  carotid artery disease,  hypothyroidism,  anxiety, depression,  Coronary disease, stent placed to proximal to mid LAD  ejection fraction 35 to 40%, repeat 09/2019: 40 to 45% ECHO: January 2023 ejection fraction 40% Who presents for follow-up of her ischemic cardiomyopathy, PAD  Last seen in clinic by myself February 2024 Seen by one of our providers May 2024  Feet numb at night, Stopped gabapentin  Ankle swelling, better with torsemide 2 pills one month ago None since then  Weight loss 20 pounds in 2 yerars 10 pounds in one year, down 6 pounds in 7 months  Labs reviewed CR 1.48, BUN 26  EKG personally reviewed by myself on todays visit Sinus bradycardia rate 56 bpm left bundle branch block   C.diff better , no recent diverticulitis after long course of antibiotics 02/2022  Other past medical history reviewed Echocardiogram   1. Frequent PVCs during study.  Left ventricular ejection  fraction, by estimation, is 40%.  paradoxical septal  motion due to known LBBB. There is moderate concentric left ventricular  hypertrophy.   3. Right ventricular systolic function is normal. The right ventricular  size is normal.   4. Left atrial size was severely dilated.   Covid, in hospital,March 06 2021 BP elevated on arrival to the hospital Creatinine 0.9 on arrival,  Treated with diuretic for shortness of breath, concern for CHF Sent home 03/10/2021,d/c with CR 1.6, BUN 92, above her baseline  After discharge, had episode of  syncope on toilet in the setting of diarrhea on 03/11/21 Presented to the hospital again with dehydration multiple episodes of profuse watery diarrhea bradycardic into the 40s, given 0.5 mg of atropine in the ER Creatinine 2.12, BUN 100 Given IV fluids  hospital September 2020, ejection fraction 35 to 40% She had symptoms of chest pain and accelerated HTN found to have elevated high sensitivity troponin peaking at 296 and acute systolic CHF.    cardiac catheterization, with severe proximal to mid LAD disease, stent placed  Follow-up echocardiogram December 2020 ejection fraction 40 up to 45% Tolerating Entresto, carvedilol, HCTZ Reports blood pressure stable at home  PMH:   has a past medical history of Anemia, C. difficile diarrhea, CHF (congestive heart failure) (HCC) (10/05/2021), Diverticulitis, GERD (gastroesophageal reflux disease), Hyperlipidemia, Hypertension, Hypothyroidism, Myocardial infarction (HCC) (08/2017), TIA (transient ischemic attack), Vertigo, and [name].  PSH:    Past Surgical History:  Procedure Laterality Date   COLONOSCOPY WITH PROPOFOL N/A 06/22/2018   Procedure: COLONOSCOPY WITH PROPOFOL;  Surgeon: Wyline Mood, MD;  Location: [facility] ENDOSCOPY;  Service: Endoscopy;  Laterality: N/A;   CORONARY STENT INTERVENTION N/A 06/07/2019   Procedure: CORONARY STENT INTERVENTION;  Surgeon: Iran Ouch, MD;  Location: [facility] INVASIVE CV LAB;  Service: Cardiovascular;  Laterality: N/A;   ESOPHAGOGASTRODUODENOSCOPY (EGD) WITH PROPOFOL N/A 06/22/2018   Procedure: ESOPHAGOGASTRODUODENOSCOPY (EGD) WITH PROPOFOL;  Surgeon: Wyline Mood, MD;  Location: [facility] ENDOSCOPY;  Service: Endoscopy;  Laterality: N/A;   KNEE  ARTHROSCOPY Right    REPLACEMENT TOTAL KNEE BILATERAL     RIGHT/LEFT HEART CATH AND CORONARY ANGIOGRAPHY N/A 06/07/2019   Procedure: RIGHT/LEFT HEART CATH AND CORONARY ANGIOGRAPHY;  Surgeon: Antonieta Iba, MD;  Location: [facility] INVASIVE CV LAB;  Service:  Cardiovascular;  Laterality: N/A;   SHOULDER SURGERY Left     Current Outpatient Medications  Medication Sig Dispense Refill   albuterol (PROVENTIL) (2.5 MG/3ML) 0.083% nebulizer solution USE 1 VIAL IN NEBULIZER EVERY 4 HOURS AS NEEDED 180 mL 6   allopurinol (ZYLOPRIM) 300 MG tablet Take 1 tablet by mouth once daily 90 tablet 3   amitriptyline (ELAVIL) 10 MG tablet Take 1 tablet (10 mg total) by mouth at bedtime. 90 tablet 3   ASPIRIN LOW DOSE 81 MG EC tablet NEW PRESCRIPTION REQUEST: TAKE ONE TABLET BY MOUTH EVERY DAY 90 tablet 3   atorvastatin (LIPITOR) 40 MG tablet Take 1 tablet by mouth once daily 90 tablet 3   carvedilol (COREG) 12.5 MG tablet Take 1 tablet by mouth twice daily 180 tablet 3   clopidogrel (PLAVIX) 75 MG tablet Take 1 tablet by mouth once daily 90 tablet 3   Ferrous Sulfate 27 MG TABS Take 65 mg by mouth at bedtime.     hydrALAZINE (APRESOLINE) 50 MG tablet TAKE 1 TABLET (50 MG) THREE TIMES A DAY, FOR HIGH BLOOD PRESSURE >150 TAKE EXTRA HYDRALAZINE 270 tablet 0   hydrocortisone 2.5 % cream Apply 1 application topically 2 (two) times daily as needed (for skin irritation).     isosorbide mononitrate (IMDUR) 30 MG 24 hr tablet TAKE 1 TABLET BY MOUTH ONCE DAILY. FOR HIGH BLOOD PRESSURE GREATER THAN 150 CAN TAKE EXTRA ISOSORBIDE. 90 tablet 0   levothyroxine (SYNTHROID) 137 MCG tablet Take 1 tablet (137 mcg total) by mouth daily before breakfast. 90 tablet 3   Melatonin 10 MG TABS Take 20 mg by mouth at bedtime.     mirtazapine (REMERON) 30 MG tablet TAKE 1 TABLET BY MOUTH AT BEDTIME 90 tablet 3   Multiple Vitamin (MULTIVITAMIN) capsule Take 1 capsule by mouth every evening.     nitroGLYCERIN (NITROSTAT) 0.4 MG SL tablet Place 1 tablet (0.4 mg total) under the tongue every 5 (five) minutes as needed for chest pain. 25 tablet 3   pantoprazole (PROTONIX) 40 MG tablet Take 1 tablet (40 mg total) by mouth daily. 90 tablet 3   potassium chloride (KLOR-CON) 10 MEQ tablet Take potassium  20 meq when you take torsemide (Patient taking differently: Take 20 mEq by mouth See admin instructions. Take potassium 20 meq when you take torsemide) 90 tablet 3   sacubitril-valsartan (ENTRESTO) 97-103 MG Take 1 tablet by mouth 2 (two) times daily. 30 tablet 1   sertraline (ZOLOFT) 25 MG tablet Take 1 tablet by mouth once daily 90 tablet 3   torsemide (DEMADEX) 20 MG tablet Take 1 tablet (20 mg total) by mouth daily as needed. For weight 153 or higher with a potassium tab 90 tablet 3   fidaxomicin (DIFICID) 200 MG TABS tablet Take 200 mg by mouth 2 (two) times daily with a meal. Take 1 tablet (200 mg total) by mouth Two (2) times a day. (Patient not taking: Reported on 11/09/2022)     fluticasone (FLONASE) 50 MCG/ACT nasal spray Place 2 sprays into both nostrils daily. (Patient not taking: Reported on 02/09/2023) 16 g 6   No current facility-administered medications for this visit.    Allergies:   Codeine, Levofloxacin, Lisinopril, Other, Povidone-iodine, Simvastatin, and  Latex   Social History:  The patient  reports that she has never smoked. She has never used smokeless tobacco. She reports that she does not drink alcohol and does not use drugs.   Family History:   family history includes Alzheimer's disease in her brother and brother; Breast cancer in her sister; Cancer in her brother; Clotting disorder in her daughter; Fibromyalgia in her daughter; Heart attack in her brother; Heart disease in her brother, brother, father, and mother; Hypertension in her mother; Mental illness in her brother; Stroke in her brother and mother.    Review of Systems: Review of Systems  Constitutional: Negative.   HENT: Negative.    Respiratory: Negative.    Cardiovascular: Negative.   Gastrointestinal: Negative.   Musculoskeletal: Negative.   Neurological: Negative.   Psychiatric/Behavioral: Negative.    All other systems reviewed and are negative.   PHYSICAL EXAM: VS:  BP 132/60 (BP Location: Left  Arm, Patient Position: Sitting, Cuff Size: Normal)   Pulse (!) 56   Ht 5' (1.524 m)   Wt 142 lb (64.4 kg)   SpO2 97%   BMI 27.73 kg/m  , BMI Body mass index is 27.73 kg/m. Constitutional:  oriented to person, place, and time. No distress.  HENT:  Head: Grossly normal Eyes:  no discharge. No scleral icterus.  Neck: No JVD, no carotid bruits  Cardiovascular: Regular rate and rhythm, no murmurs appreciated Pulmonary/Chest: Clear to auscultation bilaterally, no wheezes or rails Abdominal: Soft.  no distension.  no tenderness.  Musculoskeletal: Normal range of motion Neurological:  normal muscle tone. Coordination normal. No atrophy Skin: Skin warm and dry Psychiatric: normal affect, pleasant   Recent Labs: 02/18/2023: BUN 48; Creatinine, Ser 2.29; Potassium 4.9; Sodium 138   Lipid Panel Lab Results  Component Value Date   CHOL 115 12/15/2021   HDL 29 (L) 12/15/2021   LDLCALC 53 12/15/2021   TRIG 201 (H) 12/15/2021    Wt Readings from Last 3 Encounters:  06/21/23 142 lb (64.4 kg)  02/09/23 147 lb 9.6 oz (67 kg)  11/09/22 148 lb 8 oz (67.4 kg)     ASSESSMENT AND PLAN:  Problem List Items Addressed This Visit       Cardiology Problems   CAD (coronary artery disease) - Primary   Relevant Orders   EKG 12-Lead (Completed)     Other   CKD (chronic kidney disease), stage IIIa   Other Visit Diagnoses     HFrEF (heart failure with reduced ejection fraction) (HCC)       Hyperlipidemia LDL goal <70       Ischemic cardiomyopathy       Labile hypertension          Ischemic cardiomyopathy Tolerating Entresto, carvedilol, hydralazine  No changes to medications Appears euvolemic  CAD with stable angina Stent placed to her proximal to mid LAD Ejection fraction 40 to 45%, unchanged from 2020 Currently with no symptoms of angina. No further workup at this time. Continue current medication regimen.  Carotid bruit Mild disease bilaterally, less than 39% No need for  repeat ultrasound at this time  Hyperlipidemia On Lipitor 40 daily Cholesterol at goal  Chronic renal sufficiency stage III Stable renal function   Total encounter time more than 30 minutes  Greater than 50% was spent in counseling and coordination of care with the patient   Signed, Dossie Arbour, M.D., Ph.D. [facility] [place], [place] [phone]

## 2023-06-21 ENCOUNTER — Ambulatory Visit: Payer: Medicare Other | Attending: Cardiovascular Disease | Admitting: Cardiovascular Disease

## 2023-06-21 ENCOUNTER — Encounter: Payer: Self-pay | Admitting: Cardiovascular Disease

## 2023-06-21 VITALS — BP 132/60 | HR 56 | Ht 60.0 in | Wt 142.0 lb

## 2023-06-21 DIAGNOSIS — N1832 Chronic kidney disease, stage 3b: Secondary | ICD-10-CM

## 2023-06-21 DIAGNOSIS — I5020 Unspecified systolic (congestive) heart failure: Secondary | ICD-10-CM | POA: Diagnosis not present

## 2023-06-21 DIAGNOSIS — I2510 Atherosclerotic heart disease of native coronary artery without angina pectoris: Secondary | ICD-10-CM | POA: Diagnosis not present

## 2023-06-21 DIAGNOSIS — E785 Hyperlipidemia, unspecified: Secondary | ICD-10-CM | POA: Diagnosis not present

## 2023-06-21 DIAGNOSIS — I255 Ischemic cardiomyopathy: Secondary | ICD-10-CM | POA: Diagnosis not present

## 2023-06-21 DIAGNOSIS — R0989 Other specified symptoms and signs involving the circulatory and respiratory systems: Secondary | ICD-10-CM

## 2023-06-21 NOTE — Patient Instructions (Addendum)
Medication Instructions:  No changes  If you need a refill on your cardiac medications before your next appointment, please call your pharmacy.   Lab work: No new labs needed  Testing/Procedures: No new testing needed  Follow-Up: At [facility], you and your health needs are our priority.  As part of our continuing mission to provide you with exceptional heart care, we have created designated Provider Care Teams.  These Care Teams include your primary Cardiologist (physician) and Advanced Practice Providers (APPs -  Physician Assistants and Nurse Practitioners) who all work together to provide you with the care you need, when you need it.  You will need a follow up appointment in 6 months with APP  Providers on your designated Care Team:   Nicolasa Ducking, NP Eula Listen, PA-C Cadence Fransico Michael, [place]  COVID-19 Vaccine Information can be found at: [email or web address] For questions related to vaccine distribution or appointments, please email [email or web address] or call [phone].

## 2023-07-28 ENCOUNTER — Emergency Department: Payer: Medicare Other

## 2023-07-28 ENCOUNTER — Telehealth: Payer: Self-pay | Admitting: Cardiovascular Disease

## 2023-07-28 ENCOUNTER — Other Ambulatory Visit: Payer: Self-pay

## 2023-07-28 ENCOUNTER — Inpatient Hospital Stay
Admission: EM | Admit: 2023-07-28 | Discharge: 2023-08-02 | DRG: 280 | Disposition: A | Discharge status: Home Health | Payer: Medicare Other | Attending: Internal Medicine | Admitting: Internal Medicine

## 2023-07-28 DIAGNOSIS — I252 Old myocardial infarction: Secondary | ICD-10-CM

## 2023-07-28 DIAGNOSIS — K219 Gastro-esophageal reflux disease without esophagitis: Secondary | ICD-10-CM | POA: Diagnosis present

## 2023-07-28 DIAGNOSIS — Z7189 Other specified counseling: Secondary | ICD-10-CM

## 2023-07-28 DIAGNOSIS — E039 Hypothyroidism, unspecified: Secondary | ICD-10-CM | POA: Diagnosis present

## 2023-07-28 DIAGNOSIS — R0902 Hypoxemia: Secondary | ICD-10-CM | POA: Diagnosis not present

## 2023-07-28 DIAGNOSIS — Z803 Family history of malignant neoplasm of breast: Secondary | ICD-10-CM

## 2023-07-28 DIAGNOSIS — E782 Mixed hyperlipidemia: Secondary | ICD-10-CM | POA: Diagnosis not present

## 2023-07-28 DIAGNOSIS — I255 Ischemic cardiomyopathy: Secondary | ICD-10-CM

## 2023-07-28 DIAGNOSIS — I5042 Chronic combined systolic (congestive) and diastolic (congestive) heart failure: Secondary | ICD-10-CM | POA: Diagnosis present

## 2023-07-28 DIAGNOSIS — I9589 Other hypotension: Secondary | ICD-10-CM | POA: Diagnosis not present

## 2023-07-28 DIAGNOSIS — F419 Anxiety disorder, unspecified: Secondary | ICD-10-CM | POA: Diagnosis present

## 2023-07-28 DIAGNOSIS — R0989 Other specified symptoms and signs involving the circulatory and respiratory systems: Secondary | ICD-10-CM | POA: Diagnosis not present

## 2023-07-28 DIAGNOSIS — E785 Hyperlipidemia, unspecified: Secondary | ICD-10-CM | POA: Diagnosis not present

## 2023-07-28 DIAGNOSIS — N184 Chronic kidney disease, stage 4 (severe): Secondary | ICD-10-CM | POA: Diagnosis present

## 2023-07-28 DIAGNOSIS — Z888 Allergy status to other drugs, medicaments and biological substances status: Secondary | ICD-10-CM

## 2023-07-28 DIAGNOSIS — Z832 Family history of diseases of the blood and blood-forming organs and certain disorders involving the immune mechanism: Secondary | ICD-10-CM

## 2023-07-28 DIAGNOSIS — I471 Supraventricular tachycardia, unspecified: Secondary | ICD-10-CM | POA: Diagnosis not present

## 2023-07-28 DIAGNOSIS — D696 Thrombocytopenia, unspecified: Secondary | ICD-10-CM | POA: Diagnosis not present

## 2023-07-28 DIAGNOSIS — Z96653 Presence of artificial knee joint, bilateral: Secondary | ICD-10-CM | POA: Diagnosis present

## 2023-07-28 DIAGNOSIS — Z91040 Latex allergy status: Secondary | ICD-10-CM

## 2023-07-28 DIAGNOSIS — I130 Hypertensive heart and chronic kidney disease with heart failure and stage 1 through stage 4 chronic kidney disease, or unspecified chronic kidney disease: Secondary | ICD-10-CM | POA: Diagnosis present

## 2023-07-28 DIAGNOSIS — I25118 Atherosclerotic heart disease of native coronary artery with other forms of angina pectoris: Secondary | ICD-10-CM

## 2023-07-28 DIAGNOSIS — J9811 Atelectasis: Secondary | ICD-10-CM | POA: Diagnosis not present

## 2023-07-28 DIAGNOSIS — Z8619 Personal history of other infectious and parasitic diseases: Secondary | ICD-10-CM

## 2023-07-28 DIAGNOSIS — I10 Essential (primary) hypertension: Secondary | ICD-10-CM | POA: Diagnosis not present

## 2023-07-28 DIAGNOSIS — N390 Urinary tract infection, site not specified: Secondary | ICD-10-CM | POA: Diagnosis not present

## 2023-07-28 DIAGNOSIS — I2510 Atherosclerotic heart disease of native coronary artery without angina pectoris: Secondary | ICD-10-CM | POA: Diagnosis not present

## 2023-07-28 DIAGNOSIS — I25119 Atherosclerotic heart disease of native coronary artery with unspecified angina pectoris: Secondary | ICD-10-CM

## 2023-07-28 DIAGNOSIS — R079 Chest pain, unspecified: Secondary | ICD-10-CM | POA: Diagnosis not present

## 2023-07-28 DIAGNOSIS — F32A Depression, unspecified: Secondary | ICD-10-CM | POA: Diagnosis present

## 2023-07-28 DIAGNOSIS — I214 Non-ST elevation (NSTEMI) myocardial infarction: Principal | ICD-10-CM | POA: Diagnosis present

## 2023-07-28 DIAGNOSIS — R570 Cardiogenic shock: Secondary | ICD-10-CM | POA: Diagnosis not present

## 2023-07-28 DIAGNOSIS — I517 Cardiomegaly: Secondary | ICD-10-CM | POA: Diagnosis not present

## 2023-07-28 DIAGNOSIS — J90 Pleural effusion, not elsewhere classified: Secondary | ICD-10-CM | POA: Diagnosis not present

## 2023-07-28 DIAGNOSIS — J439 Emphysema, unspecified: Secondary | ICD-10-CM | POA: Diagnosis not present

## 2023-07-28 DIAGNOSIS — Z881 Allergy status to other antibiotic agents status: Secondary | ICD-10-CM

## 2023-07-28 DIAGNOSIS — R0789 Other chest pain: Principal | ICD-10-CM

## 2023-07-28 DIAGNOSIS — R7989 Other specified abnormal findings of blood chemistry: Secondary | ICD-10-CM | POA: Diagnosis present

## 2023-07-28 DIAGNOSIS — I2489 Other forms of acute ischemic heart disease: Secondary | ICD-10-CM

## 2023-07-28 DIAGNOSIS — Z8673 Personal history of transient ischemic attack (TIA), and cerebral infarction without residual deficits: Secondary | ICD-10-CM

## 2023-07-28 DIAGNOSIS — G9341 Metabolic encephalopathy: Secondary | ICD-10-CM | POA: Diagnosis not present

## 2023-07-28 DIAGNOSIS — Z79899 Other long term (current) drug therapy: Secondary | ICD-10-CM

## 2023-07-28 DIAGNOSIS — Z820 Family history of epilepsy and other diseases of the nervous system: Secondary | ICD-10-CM

## 2023-07-28 DIAGNOSIS — Z818 Family history of other mental and behavioral disorders: Secondary | ICD-10-CM

## 2023-07-28 DIAGNOSIS — R6889 Other general symptoms and signs: Secondary | ICD-10-CM | POA: Diagnosis not present

## 2023-07-28 DIAGNOSIS — Z955 Presence of coronary angioplasty implant and graft: Secondary | ICD-10-CM

## 2023-07-28 DIAGNOSIS — I440 Atrioventricular block, first degree: Secondary | ICD-10-CM | POA: Diagnosis not present

## 2023-07-28 DIAGNOSIS — Z823 Family history of stroke: Secondary | ICD-10-CM

## 2023-07-28 DIAGNOSIS — Z885 Allergy status to narcotic agent status: Secondary | ICD-10-CM

## 2023-07-28 DIAGNOSIS — I5020 Unspecified systolic (congestive) heart failure: Secondary | ICD-10-CM

## 2023-07-28 DIAGNOSIS — R410 Disorientation, unspecified: Secondary | ICD-10-CM | POA: Diagnosis not present

## 2023-07-28 DIAGNOSIS — N179 Acute kidney failure, unspecified: Secondary | ICD-10-CM | POA: Diagnosis not present

## 2023-07-28 DIAGNOSIS — Z79890 Hormone replacement therapy (postmenopausal): Secondary | ICD-10-CM

## 2023-07-28 DIAGNOSIS — D631 Anemia in chronic kidney disease: Secondary | ICD-10-CM | POA: Diagnosis present

## 2023-07-28 DIAGNOSIS — I6523 Occlusion and stenosis of bilateral carotid arteries: Secondary | ICD-10-CM | POA: Diagnosis present

## 2023-07-28 DIAGNOSIS — I499 Cardiac arrhythmia, unspecified: Secondary | ICD-10-CM | POA: Diagnosis not present

## 2023-07-28 DIAGNOSIS — Z7982 Long term (current) use of aspirin: Secondary | ICD-10-CM

## 2023-07-28 DIAGNOSIS — I472 Ventricular tachycardia, unspecified: Secondary | ICD-10-CM | POA: Diagnosis not present

## 2023-07-28 DIAGNOSIS — Z7902 Long term (current) use of antithrombotics/antiplatelets: Secondary | ICD-10-CM

## 2023-07-28 DIAGNOSIS — R918 Other nonspecific abnormal finding of lung field: Secondary | ICD-10-CM | POA: Diagnosis not present

## 2023-07-28 DIAGNOSIS — Z66 Do not resuscitate: Secondary | ICD-10-CM | POA: Diagnosis not present

## 2023-07-28 DIAGNOSIS — M109 Gout, unspecified: Secondary | ICD-10-CM | POA: Diagnosis present

## 2023-07-28 DIAGNOSIS — M7989 Other specified soft tissue disorders: Secondary | ICD-10-CM | POA: Diagnosis not present

## 2023-07-28 DIAGNOSIS — Z8249 Family history of ischemic heart disease and other diseases of the circulatory system: Secondary | ICD-10-CM

## 2023-07-28 DIAGNOSIS — R202 Paresthesia of skin: Secondary | ICD-10-CM

## 2023-07-28 DIAGNOSIS — R001 Bradycardia, unspecified: Secondary | ICD-10-CM

## 2023-07-28 DIAGNOSIS — I447 Left bundle-branch block, unspecified: Secondary | ICD-10-CM | POA: Diagnosis present

## 2023-07-28 DIAGNOSIS — I209 Angina pectoris, unspecified: Secondary | ICD-10-CM | POA: Diagnosis present

## 2023-07-28 LAB — HEPATIC FUNCTION PANEL
ALT: 15 U/L (ref 0–44)
AST: 21 U/L (ref 15–41)
Albumin: 3.5 g/dL (ref 3.5–5.0)
Alkaline Phosphatase: 75 U/L (ref 38–126)
Bilirubin, Direct: 0.1 mg/dL (ref 0.0–0.2)
Indirect Bilirubin: 0.3 mg/dL (ref 0.3–0.9)
Total Bilirubin: 0.4 mg/dL (ref 0.3–1.2)
Total Protein: 6.1 g/dL — ABNORMAL LOW (ref 6.5–8.1)

## 2023-07-28 LAB — PROTIME-INR
INR: 1.6 — ABNORMAL HIGH (ref 0.8–1.2)
Prothrombin Time: 19.3 s — ABNORMAL HIGH (ref 11.4–15.2)

## 2023-07-28 LAB — BASIC METABOLIC PANEL
Anion gap: 9 (ref 5–15)
Anion gap: 7 (ref 5–15)
BUN: 45 mg/dL — ABNORMAL HIGH (ref 8–23)
BUN: 43 mg/dL — ABNORMAL HIGH (ref 8–23)
CO2: 22 mmol/L (ref 22–32)
CO2: 22 mmol/L (ref 22–32)
Calcium: 8.5 mg/dL — ABNORMAL LOW (ref 8.9–10.3)
Calcium: 8.2 mg/dL — ABNORMAL LOW (ref 8.9–10.3)
Chloride: 109 mmol/L (ref 98–111)
Chloride: 108 mmol/L (ref 98–111)
Creatinine, Ser: 1.91 mg/dL — ABNORMAL HIGH (ref 0.44–1.00)
Creatinine, Ser: 1.71 mg/dL — ABNORMAL HIGH (ref 0.44–1.00)
GFR, Estimated: 25 mL/min — ABNORMAL LOW (ref >60)
GFR, Estimated: 28 mL/min — ABNORMAL LOW (ref >60)
Glucose, Bld: 120 mg/dL — ABNORMAL HIGH (ref 70–99)
Glucose, Bld: 147 mg/dL — ABNORMAL HIGH (ref 70–99)
Potassium: 4 mmol/L (ref 3.5–5.1)
Potassium: 4.1 mmol/L (ref 3.5–5.1)
Sodium: 140 mmol/L (ref 135–145)
Sodium: 137 mmol/L (ref 135–145)

## 2023-07-28 LAB — TROPONIN I (HIGH SENSITIVITY)
Troponin I (High Sensitivity): 57 ng/L — ABNORMAL HIGH (ref <18)
Troponin I (High Sensitivity): 66 ng/L — ABNORMAL HIGH (ref <18)
Troponin I (High Sensitivity): 84 ng/L — ABNORMAL HIGH (ref <18)
Troponin I (High Sensitivity): 77 ng/L — ABNORMAL HIGH (ref <18)

## 2023-07-28 LAB — HEPARIN LEVEL (UNFRACTIONATED)
Heparin Unfractionated: <0.1 [IU]/mL — ABNORMAL LOW (ref 0.30–0.70)
Heparin Unfractionated: 0.18 [IU]/mL — ABNORMAL LOW (ref 0.30–0.70)

## 2023-07-28 LAB — D-DIMER, QUANTITATIVE: D-Dimer, Quant: 1.73 ug{FEU}/mL — ABNORMAL HIGH (ref 0.00–0.50)

## 2023-07-28 LAB — TSH: TSH: 0.549 u[IU]/mL (ref 0.350–4.500)

## 2023-07-28 LAB — APTT: aPTT: 32 s (ref 24–36)

## 2023-07-28 LAB — CBC
HCT: 32 % — ABNORMAL LOW (ref 36.0–46.0)
Hemoglobin: 10.5 g/dL — ABNORMAL LOW (ref 12.0–15.0)
MCH: 31.3 pg (ref 26.0–34.0)
MCHC: 32.8 g/dL (ref 30.0–36.0)
MCV: 95.5 fL (ref 80.0–100.0)
Platelets: 134 10*3/uL — ABNORMAL LOW (ref 150–400)
RBC: 3.35 MIL/uL — ABNORMAL LOW (ref 3.87–5.11)
RDW: 13.2 % (ref 11.5–15.5)
WBC: 6.2 10*3/uL (ref 4.0–10.5)
nRBC: 0 % (ref 0.0–0.2)

## 2023-07-28 LAB — LIPID PANEL
Cholesterol: 90 mg/dL (ref 0–200)
HDL: 29 mg/dL — ABNORMAL LOW (ref >40)
LDL Cholesterol: 51 mg/dL (ref 0–99)
Total CHOL/HDL Ratio: 3.1 {ratio}
Triglycerides: 49 mg/dL (ref <150)
VLDL: 10 mg/dL (ref 0–40)

## 2023-07-28 LAB — MAGNESIUM: Magnesium: 2 mg/dL (ref 1.7–2.4)

## 2023-07-28 LAB — MRSA NEXT GEN BY PCR, NASAL: MRSA by PCR Next Gen: NOT DETECTED

## 2023-07-28 LAB — LIPASE, BLOOD: Lipase: 34 U/L (ref 11–51)

## 2023-07-28 LAB — GLUCOSE, CAPILLARY: Glucose-Capillary: 129 mg/dL — ABNORMAL HIGH (ref 70–99)

## 2023-07-28 LAB — BRAIN NATRIURETIC PEPTIDE: B Natriuretic Peptide: 407.5 pg/mL — ABNORMAL HIGH (ref 0.0–100.0)

## 2023-07-28 LAB — LACTIC ACID, PLASMA: Lactic Acid, Venous: 1.2 mmol/L (ref 0.5–1.9)

## 2023-07-28 MED ORDER — SODIUM CHLORIDE 0.9% FLUSH
3.0000 mL | Freq: Two times a day (BID) | INTRAVENOUS | Status: DC
  Administered 2023-07-28 – 2023-08-02 (×10): 3 mL via INTRAVENOUS

## 2023-07-28 MED ORDER — ONDANSETRON HCL 4 MG/2ML IJ SOLN
4.0000 mg | Freq: Four times a day (QID) | INTRAMUSCULAR | Status: DC | PRN
  Administered 2023-07-29 (×2): 4 mg via INTRAVENOUS
  Filled 2023-07-28 (×2): qty 2

## 2023-07-28 MED ORDER — HYDRALAZINE HCL 50 MG PO TABS
50.0000 mg | ORAL_TABLET | Freq: Three times a day (TID) | ORAL | Status: DC
  Administered 2023-07-28: 50 mg via ORAL
  Filled 2023-07-28: qty 1

## 2023-07-28 MED ORDER — LEVOTHYROXINE SODIUM 137 MCG PO TABS
137.0000 ug | ORAL_TABLET | Freq: Every day | ORAL | Status: DC
  Administered 2023-07-29 – 2023-08-02 (×5): 137 ug via ORAL
  Filled 2023-07-28 (×5): qty 1

## 2023-07-28 MED ORDER — MELATONIN 5 MG PO TABS: 5.0000 mg | ORAL_TABLET | Freq: Every evening | ORAL | Status: DC | PRN

## 2023-07-28 MED ORDER — ONDANSETRON HCL 4 MG PO TABS
4.0000 mg | ORAL_TABLET | Freq: Four times a day (QID) | ORAL | Status: DC | PRN
  Filled 2023-07-28: qty 1

## 2023-07-28 MED ORDER — FENTANYL CITRATE PF 50 MCG/ML IJ SOSY
25.0000 ug | PREFILLED_SYRINGE | Freq: Once | INTRAMUSCULAR | Status: AC
  Administered 2023-07-28: 25 ug via INTRAVENOUS
  Filled 2023-07-28: qty 1

## 2023-07-28 MED ORDER — INFLUENZA VAC A&B SURF ANT ADJ 0.5 ML IM SUSY
0.5000 mL | PREFILLED_SYRINGE | INTRAMUSCULAR | Status: DC
  Filled 2023-07-28: qty 0.5

## 2023-07-28 MED ORDER — MORPHINE SULFATE (PF) 2 MG/ML IV SOLN
2.0000 mg | Freq: Once | INTRAVENOUS | Status: AC
  Administered 2023-07-28: 2 mg via INTRAVENOUS
  Filled 2023-07-28: qty 1

## 2023-07-28 MED ORDER — CLOPIDOGREL BISULFATE 75 MG PO TABS
75.0000 mg | ORAL_TABLET | Freq: Every day | ORAL | Status: DC
  Administered 2023-07-28 – 2023-08-02 (×6): 75 mg via ORAL
  Filled 2023-07-28 (×6): qty 1

## 2023-07-28 MED ORDER — POLYETHYLENE GLYCOL 3350 17 G PO PACK
17.0000 g | PACK | Freq: Every day | ORAL | Status: DC | PRN
Start: 2023-07-28 — End: 2023-08-02
  Administered 2023-07-31: 17 g via ORAL
  Filled 2023-07-28: qty 1

## 2023-07-28 MED ORDER — HYDRALAZINE HCL 20 MG/ML IJ SOLN
5.0000 mg | Freq: Three times a day (TID) | INTRAMUSCULAR | Status: DC | PRN
  Administered 2023-07-31 – 2023-08-01 (×2): 5 mg via INTRAVENOUS
  Filled 2023-07-28 (×2): qty 1

## 2023-07-28 MED ORDER — MORPHINE SULFATE (PF) 2 MG/ML IV SOLN: 1.0000 mg | Freq: Once | INTRAVENOUS | Status: AC

## 2023-07-28 MED ORDER — SACUBITRIL-VALSARTAN 97-103 MG PO TABS
1.0000 | ORAL_TABLET | Freq: Two times a day (BID) | ORAL | Status: DC
  Filled 2023-07-28: qty 1

## 2023-07-28 MED ORDER — EPINEPHRINE HCL 5 MG/250ML IV SOLN IN NS
0.5000 ug/min | INTRAVENOUS | Status: DC
  Filled 2023-07-28: qty 250

## 2023-07-28 MED ORDER — HEPARIN BOLUS VIA INFUSION
1900.0000 [IU] | Freq: Once | INTRAVENOUS | Status: AC
  Administered 2023-07-28: 1900 [IU] via INTRAVENOUS
  Filled 2023-07-28: qty 1900

## 2023-07-28 MED ORDER — PANTOPRAZOLE SODIUM 40 MG PO TBEC
40.0000 mg | DELAYED_RELEASE_TABLET | Freq: Every day | ORAL | Status: DC
  Administered 2023-07-29 – 2023-08-02 (×5): 40 mg via ORAL
  Filled 2023-07-28 (×5): qty 1

## 2023-07-28 MED ORDER — ISOSORBIDE MONONITRATE ER 30 MG PO TB24
30.0000 mg | ORAL_TABLET | Freq: Every day | ORAL | Status: DC
  Administered 2023-07-28: 30 mg via ORAL
  Filled 2023-07-28: qty 1

## 2023-07-28 MED ORDER — MELATONIN 5 MG PO TABS
2.5000 mg | ORAL_TABLET | Freq: Every evening | ORAL | Status: DC | PRN
  Administered 2023-07-28 – 2023-07-31 (×4): 2.5 mg via ORAL
  Filled 2023-07-28 (×4): qty 1

## 2023-07-28 MED ORDER — ATORVASTATIN CALCIUM 20 MG PO TABS
40.0000 mg | ORAL_TABLET | Freq: Every day | ORAL | Status: DC
  Administered 2023-07-29 – 2023-08-01 (×4): 40 mg via ORAL
  Filled 2023-07-28 (×4): qty 2

## 2023-07-28 MED ORDER — SERTRALINE HCL 50 MG PO TABS
25.0000 mg | ORAL_TABLET | Freq: Every day | ORAL | Status: DC
  Administered 2023-07-29 – 2023-08-02 (×5): 25 mg via ORAL
  Filled 2023-07-28 (×5): qty 1

## 2023-07-28 MED ORDER — ASPIRIN 81 MG PO TBEC
81.0000 mg | DELAYED_RELEASE_TABLET | Freq: Every day | ORAL | Status: DC
  Administered 2023-07-28 – 2023-08-02 (×6): 81 mg via ORAL
  Filled 2023-07-28 (×6): qty 1

## 2023-07-28 MED ORDER — ALUM & MAG HYDROXIDE-SIMETH 200-200-20 MG/5ML PO SUSP: 30.0000 mL | Freq: Four times a day (QID) | ORAL | Status: DC | PRN

## 2023-07-28 MED ORDER — ONDANSETRON HCL 4 MG/2ML IJ SOLN
4.0000 mg | Freq: Once | INTRAMUSCULAR | Status: AC
  Administered 2023-07-28: 4 mg via INTRAVENOUS
  Filled 2023-07-28: qty 2

## 2023-07-28 MED ORDER — HEPARIN BOLUS VIA INFUSION
3800.0000 [IU] | Freq: Once | INTRAVENOUS | Status: AC
  Administered 2023-07-28: 3800 [IU] via INTRAVENOUS
  Filled 2023-07-28: qty 3800

## 2023-07-28 MED ORDER — MORPHINE SULFATE (PF) 2 MG/ML IV SOLN
INTRAVENOUS | Status: AC
  Administered 2023-07-28: 1 mg via INTRAVENOUS
  Filled 2023-07-28: qty 1

## 2023-07-28 MED ORDER — ACETAMINOPHEN 650 MG RE SUPP: 650.0000 mg | Freq: Four times a day (QID) | RECTAL | Status: DC | PRN

## 2023-07-28 MED ORDER — HYDROMORPHONE HCL 1 MG/ML IJ SOLN
0.5000 mg | INTRAMUSCULAR | Status: DC | PRN
  Administered 2023-07-29: 0.5 mg via INTRAVENOUS
  Administered 2023-07-29: 1 mg via INTRAVENOUS
  Filled 2023-07-28 (×2): qty 1

## 2023-07-28 MED ORDER — NITROGLYCERIN 0.4 MG SL SUBL
0.4000 mg | SUBLINGUAL_TABLET | SUBLINGUAL | Status: DC | PRN
  Administered 2023-07-28: 0.4 mg via SUBLINGUAL
  Filled 2023-07-28 (×2): qty 1

## 2023-07-28 MED ORDER — NITROGLYCERIN 2 % TD OINT
1.0000 [in_us] | TOPICAL_OINTMENT | Freq: Once | TRANSDERMAL | Status: AC
  Administered 2023-07-28: 1 [in_us] via TOPICAL
  Filled 2023-07-28: qty 1

## 2023-07-28 MED ORDER — SODIUM CHLORIDE 0.9 % IV BOLUS
500.0000 mL | Freq: Once | INTRAVENOUS | Status: AC
  Administered 2023-07-28: 500 mL via INTRAVENOUS

## 2023-07-28 MED ORDER — CARVEDILOL 12.5 MG PO TABS
12.5000 mg | ORAL_TABLET | Freq: Two times a day (BID) | ORAL | Status: DC
Start: 2023-07-28 — End: 2023-07-28
  Administered 2023-07-28: 12.5 mg via ORAL
  Filled 2023-07-28: qty 1

## 2023-07-28 MED ORDER — ACETAMINOPHEN 325 MG PO TABS
650.0000 mg | ORAL_TABLET | Freq: Four times a day (QID) | ORAL | Status: DC | PRN
  Administered 2023-07-30: 650 mg via ORAL
  Filled 2023-07-28 (×2): qty 2

## 2023-07-28 MED ORDER — HEPARIN (PORCINE) 25000 UT/250ML-% IV SOLN
950.0000 [IU]/h | INTRAVENOUS | Status: DC
  Administered 2023-07-28: 750 [IU]/h via INTRAVENOUS
  Administered 2023-07-30: 950 [IU]/h via INTRAVENOUS
  Filled 2023-07-28 (×3): qty 250

## 2023-07-28 NOTE — Progress Notes (Signed)
Patient arrived to unit as RR. Patient A/O x4, 8/10 pain. HR in 50s, pressures stable. Bladder scan showed 650, Foley placed. 850 out.   NP at bedside, discussed goals of care. Patient code status changed.  Patient states pain 5/10, "feels better".  Patient sleeping in bed.

## 2023-07-28 NOTE — Assessment & Plan Note (Signed)
In 2024, patient's creatinine has ranged between 1.48 and 2.29, concerning with progression of her CKD stage IIIb to CKD stage IV.  Creatinine currently 1.91.  - Avoid nephrotoxic agents as able - Daily BMP - Strict in and out

## 2023-07-28 NOTE — Progress Notes (Signed)
   07/28/23 1900  Spiritual Encounters  Type of Visit Initial  Care provided to: Family  Referral source Code page  Reason for visit Code  OnCall Visit Yes   Chaplain responded to Rapid Response Code. Chaplain provided support to daughter as she waited in the hall and then accompanied her to ICU waiting room after her mom was transferred.

## 2023-07-28 NOTE — Assessment & Plan Note (Signed)
Patient has a history of CAD s/p PCI with DES to mid/proximal LAD complicated by ischemic cardiomyopathy.  - Cardiology following; appreciate their recommendations - Continue DAPT and statin - As noted above heparin per pharmacy dosing

## 2023-07-28 NOTE — IPAL (Signed)
  Interdisciplinary Goals of Care Family Meeting   Date carried out: 07/28/2023  Location of the meeting: Bedside  Member's involved: Nurse Practitioner, Bedside Registered Nurse, and Family Member or next of kin  Durable Power of Attorney or acting medical decision maker: Niece- HCPOA and the patient's two daughters as well as the patient herself discussed GOC    Discussion: We discussed goals of care for [employer].  Updated family on current clinical status including recent bradycardic event with correlating hypotension.  Niece brought in patient's living well for review, we discussed the difference between DNR DNI and full code.  We discussed CPR and chest compressions in detail in relationship to her advanced age and multiple co-morbidities.  Family and patient ultimately decided to institute DNR DNI CODE STATUS while continuing all other interventions necessary for treatment.  Code status:   Code Status: Limited: Do not attempt resuscitation (DNR) -DNR-LIMITED -Do Not Intubate/DNI    Disposition: Continue current acute care  Time spent for the meeting: 25 minutes    Betsey Holiday, AGACNP-BC Acute Care Nurse Practitioner [facility]   [phone] / [phone] Please see Amion for details.

## 2023-07-28 NOTE — Consult Note (Addendum)
Cardiology Consultation   Patient ID: Stephanie Charles MRN: [id number]; DOB: August 22, 1930  Admit date: 07/28/2023 Date of Consult: 07/28/2023  PCP:  Bosie Clos, MD   [facility] Providers Cardiologist:  Julien Nordmann, MD   {  Patient Profile:   Stephanie Charles is a 89 y.o. female with a hx of CAD with prior NSTEMI in 2016 and NSTEMI in 06/2019 s/p PCI/DES to the LAD, HFrEF 2/2 ICM, labile HTN, syncope felt to be vasovagal, TIA/posterior CVA, LBBB, carotid artery disease, hypothyroidism, diverticulitis, C. Dif, CKD stage 3, anemia, gout, anxiety, depression who is being seen 07/28/2023 for the evaluation of chest pain at the request of Dr. Erma Heritage.  History of Present Illness:   Stephanie Charles previously followed with Dr. Lady Gary and transitioned to [facility] in 2020. She was admitted to [facility] in 2020 with NSTEMI and HTN. Echo showed LVEF 35-40%. Frequenct PVCs noted. R/L heart cath showed mid LAD 95% stenosis s/p successful PCI/DES, D2 30% stenosis. RHC showed mean RA pressure of 8, RV pressure 32/7 with a mean of 9, PA pressure 31/16 with a mean of 21, PCWP 10, CO/CI 3.57/2.02. Echo 09/2019 showed LVEF 40-45%, moderate LVH, G1DD. Echo in 2022 showed LVEF 50-55%.  Echo 10/2021 showed EF 40%, moderate LVH, normal RVSF, frequent PVCs. Outpatient cardiac monitor showed NSR, average 70bpm, BBB, 1 ru NSVT, 3.4% PVC burden.   She was seen 06/2023 and was stable from a cardiac perspective.   The patient presented to the ER with chest pain. Chest pain woke her up this AM. She had associated nasuea. In the ER HS trop 57. BP 185/70, HR 70bpm, afebrile, 93%O2.Scr 1.91, K 4.0, WBC 6.2, Hgb 10.5.    She is still having chest pain 8/10 despite nitroglycerin paste.    Past Medical History:  Diagnosis Date   Anemia    C. difficile diarrhea    CHF (congestive heart failure) (HCC) 10/05/2021   Diverticulitis    GERD (gastroesophageal reflux disease)    Hyperlipidemia    Hypertension     Hypothyroidism    Myocardial infarction (HCC) 08/2017   TIA (transient ischemic attack)    1 approx 2015, 1 approx 2018   Vertigo    last episode several months ago   [name]     Past Surgical History:  Procedure Laterality Date   COLONOSCOPY WITH PROPOFOL N/A 06/22/2018   Procedure: COLONOSCOPY WITH PROPOFOL;  Surgeon: Wyline Mood, MD;  Location: [facility] ENDOSCOPY;  Service: Endoscopy;  Laterality: N/A;   CORONARY STENT INTERVENTION N/A 06/07/2019   Procedure: CORONARY STENT INTERVENTION;  Surgeon: Iran Ouch, MD;  Location: [facility] INVASIVE CV LAB;  Service: Cardiovascular;  Laterality: N/A;   ESOPHAGOGASTRODUODENOSCOPY (EGD) WITH PROPOFOL N/A 06/22/2018   Procedure: ESOPHAGOGASTRODUODENOSCOPY (EGD) WITH PROPOFOL;  Surgeon: Wyline Mood, MD;  Location: [facility] ENDOSCOPY;  Service: Endoscopy;  Laterality: N/A;   KNEE ARTHROSCOPY Right    REPLACEMENT TOTAL KNEE BILATERAL     RIGHT/LEFT HEART CATH AND CORONARY ANGIOGRAPHY N/A 06/07/2019   Procedure: RIGHT/LEFT HEART CATH AND CORONARY ANGIOGRAPHY;  Surgeon: Antonieta Iba, MD;  Location: [facility] INVASIVE CV LAB;  Service: Cardiovascular;  Laterality: N/A;   SHOULDER SURGERY Left      Home Medications:  Prior to Admission medications   Medication Sig Start Date End Date Taking? Authorizing Provider  albuterol (PROVENTIL) (2.5 MG/3ML) 0.083% nebulizer solution USE 1 VIAL IN NEBULIZER EVERY 4 HOURS AS NEEDED 01/04/22   Bosie Clos, MD  allopurinol (ZYLOPRIM) 300 MG tablet Take 1 tablet by mouth once daily 12/29/21   Bosie Clos, MD  amitriptyline (ELAVIL) 10 MG tablet Take 1 tablet (10 mg total) by mouth at bedtime. 12/27/22   Malva Limes, MD  ASPIRIN LOW DOSE 81 MG EC tablet NEW PRESCRIPTION REQUEST: TAKE ONE TABLET BY MOUTH EVERY DAY 08/03/21   Bosie Clos, MD  atorvastatin (LIPITOR) 40 MG tablet Take 1 tablet by mouth once daily 04/13/23   Antonieta Iba, MD  carvedilol (COREG) 12.5 MG tablet Take 1  tablet by mouth twice daily 04/13/23   Antonieta Iba, MD  clopidogrel (PLAVIX) 75 MG tablet Take 1 tablet by mouth once daily 04/13/23   Antonieta Iba, MD  Ferrous Sulfate 27 MG TABS Take 65 mg by mouth at bedtime.    [provider]  fidaxomicin (DIFICID) 200 MG TABS tablet Take 200 mg by mouth 2 (two) times daily with a meal. Take 1 tablet (200 mg total) by mouth Two (2) times a day. Patient not taking: Reported on 11/09/2022 02/18/22   [provider]  fluticasone (FLONASE) 50 MCG/ACT nasal spray Place 2 sprays into both nostrils daily. Patient not taking: Reported on 02/09/2023 02/23/22   Bosie Clos, MD  hydrALAZINE (APRESOLINE) 50 MG tablet TAKE 1 TABLET (50 MG) THREE TIMES A DAY, FOR HIGH BLOOD PRESSURE >150 TAKE EXTRA HYDRALAZINE 05/06/23   Antonieta Iba, MD  hydrocortisone 2.5 % cream Apply 1 application topically 2 (two) times daily as needed (for skin irritation). 02/05/21   [provider]  isosorbide mononitrate (IMDUR) 30 MG 24 hr tablet TAKE 1 TABLET BY MOUTH ONCE DAILY. FOR HIGH BLOOD PRESSURE GREATER THAN 150 CAN TAKE EXTRA ISOSORBIDE. 06/17/23   Antonieta Iba, MD  levothyroxine (SYNTHROID) 137 MCG tablet Take 1 tablet (137 mcg total) by mouth daily before breakfast. 04/12/22   Bosie Clos, MD  Melatonin 10 MG TABS Take 20 mg by mouth at bedtime.    [provider]  mirtazapine (REMERON) 30 MG tablet TAKE 1 TABLET BY MOUTH AT BEDTIME 04/21/22   Bosie Clos, MD  Multiple Vitamin (MULTIVITAMIN) capsule Take 1 capsule by mouth every evening.    [provider]  nitroGLYCERIN (NITROSTAT) 0.4 MG SL tablet Place 1 tablet (0.4 mg total) under the tongue every 5 (five) minutes as needed for chest pain. 04/09/22   Antonieta Iba, MD  pantoprazole (PROTONIX) 40 MG tablet Take 1 tablet (40 mg total) by mouth daily. 02/18/23   Dunn, Raymon Mutton, PA-C  potassium chloride (KLOR-CON) 10 MEQ tablet Take potassium 20 meq when you take  torsemide Patient taking differently: Take 20 mEq by mouth See admin instructions. Take potassium 20 meq when you take torsemide 10/09/21   Gollan, Tollie Pizza, MD  sacubitril-valsartan (ENTRESTO) 97-103 MG Take 1 tablet by mouth 2 (two) times daily. 12/10/22   Antonieta Iba, MD  sertraline (ZOLOFT) 25 MG tablet Take 1 tablet by mouth once daily 04/21/22   Bosie Clos, MD  torsemide (DEMADEX) 20 MG tablet Take 1 tablet (20 mg total) by mouth daily as needed. For weight 153 or higher with a potassium tab 04/09/22   Antonieta Iba, MD    Inpatient Medications: Scheduled Meds:  Continuous Infusions:  heparin 750 Units/hr (07/28/23 1122)   PRN Meds:   Allergies:    Allergies  Allergen Reactions   Codeine Nausea And Vomiting   Levofloxacin Other (See Comments)  Mouth sores   Lisinopril Other (See Comments)    Cough?   Other Itching   Povidone-Iodine Other (See Comments) and Rash    Severe blistering and itchiness. Severe redness   Simvastatin Other (See Comments)    Myalgias   Latex Itching    Social History:   Social History   Socioeconomic History   Marital status: Divorced    Spouse name: na   Number of children: 4   Years of education: 14   Highest education level: Associate degree: occupational, Scientist, product/process development, or vocational program  Occupational History   Occupation: retired  Tobacco Use   Smoking status: Never   Smokeless tobacco: Never  Vaping Use   Vaping status: Never Used  Substance and Sexual Activity   Alcohol use: No    Alcohol/week: 0.0 standard drinks of alcohol   Drug use: No   Sexual activity: Never  Other Topics Concern   Not on file  Social History Narrative   Not on file   Social Determinants of Health   Financial Resource Strain: Low Risk  (02/16/2022)   Received from [facility], [facility]   Overall Financial Resource Strain (CARDIA)    Difficulty of Paying Living Expenses: Not hard at all  Food Insecurity: No Food  Insecurity (02/16/2022)   Received from [facility], [facility]   Hunger Vital Sign    Worried About Running Out of Food in the Last Year: Never true    Ran Out of Food in the Last Year: Never true  Transportation Needs: No Transportation Needs (02/16/2022)   Received from [facility], [facility]   PRAPARE - Transportation    Lack of Transportation (Medical): No    Lack of Transportation (Non-Medical): No  Physical Activity: Inactive (08/18/2020)   Exercise Vital Sign    Days of Exercise per Week: 0 days    Minutes of Exercise per Session: 0 min  Stress: No Stress Concern Present (08/18/2020)   [employer] of Occupational Health - Occupational Stress Questionnaire    Feeling of Stress : Not at all  Social Connections: Socially Isolated (08/18/2020)   Social Connection and Isolation Panel [NHANES]    Frequency of Communication with Friends and Family: More than three times a week    Frequency of Social Gatherings with Friends and Family: More than three times a week    Attends Religious Services: Never    Database administrator or Organizations: No    Attends Banker Meetings: Never    Marital Status: Divorced  Catering manager Violence: Not At Risk (08/18/2020)   Humiliation, Afraid, Rape, and Kick questionnaire    Fear of Current or Ex-Partner: No    Emotionally Abused: No    Physically Abused: No    Sexually Abused: No    Family History:    Family History  Problem Relation Age of Onset   Heart disease Mother    Hypertension Mother    Stroke Mother    Heart disease Father    Breast cancer Sister    Alzheimer's disease Brother    Heart attack Brother    Stroke Brother    Heart disease Brother    Mental illness Brother        died suicide   Cancer Brother        prostate   Heart disease Brother        atrial fib   Alzheimer's disease Brother    Clotting  disorder Daughter    Fibromyalgia Daughter      ROS:  Please see the  history of present illness.   All other ROS reviewed and negative.     Physical Exam/Data:   Vitals:   07/28/23 1030 07/28/23 1100 07/28/23 1101 07/28/23 1115  BP: (!) 198/82  (!) 209/68 (!) 181/61  Pulse: 78 72  73  Resp: (!) 25 20  17   Temp:      SpO2: 96% 97%  94%  Weight:      Height:       No intake or output data in the 24 hours ending 07/28/23 1143    07/28/2023   10:05 AM 06/21/2023    2:11 PM 02/09/2023    2:28 PM  Last 3 Weights  Weight (lbs) 140 lb 142 lb 147 lb 9.6 oz  Weight (kg) 63.504 kg 64.411 kg 66.951 kg     Body mass index is 25.61 kg/m.  General:  Well nourished, well developed, in no acute distress HEENT: normal Neck: no JVD Vascular: No carotid bruits; Distal pulses 2+ bilaterally Cardiac:  normal S1, S2; RRR; no murmur  Lungs:  clear to auscultation bilaterally, no wheezing, rhonchi or rales  Abd: soft, nontender, no hepatomegaly  Ext: no edema Musculoskeletal:  No deformities, BUE and BLE strength normal and equal Skin: warm and dry  Neuro:  CNs 2-12 intact, no focal abnormalities noted Psych:  Normal affect   EKG:  The EKG was personally reviewed and demonstrates:  NSR 70bpm, 1st degree AV block, TWI inf leads  Relevant CV Studies:  Carotid artery ultrasound 12/28/2022: Summary:  Right Carotid: Velocities in the right ICA are consistent with a 1-39%  stenosis.  Non-hemodynamically significant plaque <50% noted in the  CCA. The ECA appears >50% stenosed. Tortuous CCA.   Left Carotid: Velocities in the left ICA are consistent with a 1-39%  stenosis.  Non-hemodynamically significant plaque <50% noted in the  CCA. The ECA appears <50% stenosed.   Vertebrals:  Bilateral vertebral arteries demonstrate antegrade flow.  Subclavians: Bilateral subclavian artery flow was disturbed.  __________   Luci Bank patch 09/2022: Normal sinus rhythm Patient had a min HR of 41 bpm, max HR of 185 bpm, and avg HR of 70 bpm.    Bundle Branch Block/IVCD was  present.  1 run of Ventricular Tachycardia occurred lasting 8 beats with a max rate of 185 bpm (avg 170  bpm).    2 Supraventricular Tachycardia runs occurred, the run with the fastest interval lasting 9 beats with a max rate of 133 bpm (avg 96 bpm); the run with the fastest interval was also the longest.    Junctional Rhythm was present.    Isolated SVEs were occasional (1.9%, 23596), SVE Couplets were rare (<1.0%, 1783), and SVE Triplets were rare (<1.0%, 296).    Isolated VEs were occasional (3.4%, 42235), VE Couplets were rare (<1.0%, 1465), and VE Triplets were rare (<1.0%, 1). Ventricular Bigeminy and Trigeminy were present.   No patient triggered events recorded __________   2D echo 10/07/2021: 1. Frequent PVCs during study.   2. Difficult to determine LVEF due to very frequent ectopy and limited  echo windows. Based on images with no ectopy, left ventricular ejection  fraction, by estimation, is 40%. The left ventricle has  mildly-to-moderately decreased function. The left  ventricle demonstrates global hypokinesis. There is paradoxical septal  motion due to known LBBB. There is moderate concentric left ventricular  hypertrophy. Left ventricular diastolic parameters are indeterminate.  3. Right ventricular systolic function is normal. The right ventricular  size is normal.   4. Left atrial size was severely dilated.   5. The mitral valve is grossly normal. Trivial mitral valve  regurgitation.   6. The aortic valve is tricuspid. There is mild calcification of the  aortic valve. There is mild thickening of the aortic valve. Aortic valve  regurgitation is not visualized. Aortic valve sclerosis/calcification is  present, without any evidence of  aortic stenosis.   7. The inferior vena cava is normal in size with greater than 50%  respiratory variability, suggesting right atrial pressure of 3 mmHg.  __________   2D echo 03/07/2021: 1. Left ventricular ejection fraction, by  estimation, is 50 to 55%. The  left ventricle has low normal function. Left ventricular endocardial  border not optimally defined to evaluate regional wall motion. There is  moderate left ventricular hypertrophy.  Indeterminate diastolic filling due to E-A fusion.   2. Right ventricular systolic function is normal. The right ventricular  size is normal. Tricuspid regurgitation signal is inadequate for assessing  PA pressure.   3. Left atrial size was moderately dilated.   4. The mitral valve is abnormal. No evidence of mitral valve  regurgitation. No evidence of mitral stenosis.   5. The aortic valve has an indeterminant number of cusps. There is mild  thickening of the aortic valve. Aortic valve regurgitation is not  visualized. Mild aortic valve sclerosis is present, with no evidence of  aortic valve stenosis. __________   Carotid artery ultrasound 07/2020: Summary:  Right Carotid: Velocities in the right ICA are consistent with a 1-39%  stenosis.  Non-hemodynamically significant plaque <50% noted in the  CCA.  The ECA appears >50% stenosed.   Left Carotid: Velocities in the left ICA are consistent with a 1-39%  stenosis.  Non-hemodynamically significant plaque <50% noted in the  CCA.  The ECA appears <50% stenosed.   Vertebrals:  Bilateral vertebral arteries demonstrate antegrade flow.  Subclavians: Left subclavian artery was stenotic. Normal flow hemodynamics  were seen in the right subclavian artery.  __________   2D echo 09/2019: 1. Left ventricular ejection fraction, by visual estimation, is 40 to  45%. The left ventricle has moderately decreased function. There is  moderately increased left ventricular hypertrophy.   2. Abnormal septal motion consistent with left bundle branch block.   3. Left ventricular diastolic parameters are consistent with Grade I  diastolic dysfunction (impaired relaxation).   4. The left ventricle demonstrates global hypokinesis.   5. Global  right ventricle has moderately reduced systolic function.The  right ventricular size is normal. Mildly increased right ventricular wall  thickness.   6. Left atrial size was moderately dilated.   7. Right atrial size was not well visualized.   8. Presence of pericardial fat pad.   9. The mitral valve was not well visualized. Trivial mitral valve  regurgitation. No evidence of mitral stenosis.  10. The tricuspid valve is not well visualized. Tricuspid valve  regurgitation is not demonstrated.  11. The aortic valve is tricuspid. Aortic valve regurgitation is not  visualized. No evidence of aortic valve sclerosis or stenosis.  12. Pulmonic regurgitation is mild.  13. The pulmonic valve was not well visualized. Pulmonic valve  regurgitation is mild.  14. TR signal is inadequate for assessing pulmonary artery systolic  pressure.  15. The inferior vena cava is dilated in size with >50% respiratory  variability, suggesting right atrial pressure of 8 mmHg.  16. The  interatrial septum was not well visualized. __________   [facility] with PCI in 06/07/2019: Mid LAD lesion is 95% stenosed. Post intervention, there is a 0% residual stenosis. A drug-eluting stent was successfully placed using a STENT RESOLUTE ONYX 2.5X22. 2nd Diag lesion is 30% stenosed.   Successful angioplasty and drug-eluting stent placement to the mid LAD.   Recommendations: Dual antiplatelet therapy for at least 1 year. Blood pressure control and cardiac rehab.  Continue treatment of risk factors.       Coronary angiography:  Coronary dominance: Right  Left mainstem:  Large vessel that bifurcates into the LAD and left circumflex, no significant disease noted  Left anterior descending (LAD):   Large vessel that extends to the apical region, diagonal branch 2 of moderate size, proximal to mid LAD prior to large diagonal with 90% stenosis, hazy, heavily calcified.  Left circumflex (LCx):  Large vessel with OM branch 2, no  significant disease noted, mild to moderate proximal to mid disease estimated 40%  Right coronary artery (RCA):  Right dominant vessel with PL and PDA, no significant disease noted, very mild proximal disease estimated 30%  Left ventriculography: Aortic valve crossed for pressures, no significant there is no significant mitral regurgitation , no significant aortic valve stenosis Frequent ectopy noted  RA pressures mean of 8 RV pressure 32/7 mean 9 PA pressure 31/16 mean 21 Wedge pressure 10 Cardiac output 3.57 Cardiac index 2.02  Final Conclusions:   Culprit vessel for presentation is the proximal to mid LAD estimated at 90% Case discussed with Dr. Kirke Corin, he will attempt PCI   Recommendations:  pci as above Cardiomyopathy meds, will make changes __________   2D echo 06/2019: 1. The left ventricle has moderately reduced systolic function, with an  ejection fraction of 35-40%. The cavity size was normal. There is  moderately increased left ventricular wall thickness. Left ventricular  diastolic Doppler parameters are  indeterminate. Left ventricular diffuse hypokinesis.   2. The right ventricle has normal systolic function. The cavity was  normal. There is no increase in right ventricular wall thickness.Unable to  estimate RVSP   3. Frequent PVCs.   4. Left atrial size was moderately dilated.  Laboratory Data:  High Sensitivity Troponin:   Recent Labs  Lab 07/28/23 1009  TROPONINIHS 57*     Chemistry Recent Labs  Lab 07/28/23 1009  NA 140  K 4.0  CL 109  CO2 22  GLUCOSE 120*  BUN 45*  CREATININE 1.91*  CALCIUM 8.5*  GFRNONAA 25*  ANIONGAP 9    No results for input(s): "PROT", "ALBUMIN", "AST", "ALT", "ALKPHOS", "BILITOT" in the last 168 hours. Lipids No results for input(s): "CHOL", "TRIG", "HDL", "LABVLDL", "LDLCALC", "CHOLHDL" in the last 168 hours.  Hematology Recent Labs  Lab 07/28/23 1009  WBC 6.2  RBC 3.35*  HGB 10.5*  HCT 32.0*  MCV 95.5  MCH  31.3  MCHC 32.8  RDW 13.2  PLT 134*   Thyroid No results for input(s): "TSH", "FREET4" in the last 168 hours.  BNPNo results for input(s): "BNP", "PROBNP" in the last 168 hours.  DDimer No results for input(s): "DDIMER" in the last 168 hours.   Radiology/Studies:  No results found.   Assessment and Plan:   NSTEMI CAD with prior PCI - patient presented with chest pain that awoke her from sleep with associated nausea -High-sensitivity troponin 57 and started on IV heparin - Patient is also hypertensive -She was given Nitropaste with mild improvement of chest pain -Patient reported 8/10  chest pain during interview -Second troponin pending - EKG with no new changes -Continue IV heparin -Recheck echo -Continue aspirin 81 mg daily, Lipitor 40 mg daily, Coreg 12.5 mg twice daily, Plavix 75 mg daily, Imdur 30 mg daily -she may need heart cath.  She is n.p.o. although need to consider kidney function  HFrEF/ICM - Echo with EF as low as 35-40% in 2020 - most recent echo 2023 showed LVEF 40%, moderate LVH, normal RVSF -PTA Entresto 97-103 mg twice daily, torsemide 20 mg daily, Coreg 12.5 mg twice daily, Hydralazine 50mg  TID - restart meds as able - re-check echo - she appears euvolemic on exam  HTN -B elevated on Admission - PTA PTA Entresto 97-103 mg twice daily, Coreg 12 5 mg twice daily, hydralazine 50 mg 3 times daily, Imdur 30 mg daily>restart as able  HLD - LDL 47 - continue Lipitor 40mg  daily  PVC - known frequent PVCs - continue BB therapy  For questions or updates, please contact [facility] Please consult [email or web address] for contact info under    Signed, Bir[place] Troia David Stall, PA-C  07/28/2023 11:43 AM

## 2023-07-28 NOTE — ED Provider Notes (Signed)
[facility] Provider Note    Event Date/Time   First MD Initiated Contact with Patient 07/28/23 1009     (approximate)   History   Chest Pain   HPI  Stephanie Charles is a 89 y.o. female  here with cp. Pt has h/o CAD, HTN, reports she has had aching, dull substernal chest pressure throughout the day today. It feels similar to her prior MI. She has had associated significant shortness of breath, no nausea, vomiting. No specific alleviating factors, though she did have mild relief w/  nitroglycerin.       Physical Exam   Triage Vital Signs: ED Triage Vitals  Encounter Vitals Group     BP 07/28/23 1006 (!) 185/70     Systolic BP Percentile --      Diastolic BP Percentile --      Pulse Rate 07/28/23 1006 72     Resp 07/28/23 1006 18     Temp 07/28/23 1006 97.7 F (36.5 C)     Temp src --      SpO2 07/28/23 1006 93 %     Weight 07/28/23 1005 140 lb (63.5 kg)     Height 07/28/23 1005 5\' 2"  (1.575 m)     Head Circumference --      Peak Flow --      Pain Score 07/28/23 1004 9     Pain Loc --      Pain Education --      Exclude from Growth Chart --     Most recent vital signs: Vitals:   07/28/23 2245 07/28/23 2300  BP: (!) 136/45 125/87  Pulse: (!) 42 (!) 55  Resp: 16 17  Temp:    SpO2: 98% 91%     General: Awake, no distress.  CV:  Good peripheral perfusion. RRR. Resp:  Normal work of breathing. Lungs CTAB. Abd:  No distention. Abdomen soft, NT, ND. Other:  No LE edema.   ED Results / Procedures / Treatments   Labs (all labs ordered are listed, but only abnormal results are displayed) Labs Reviewed  BASIC METABOLIC PANEL - Abnormal; Notable for the following components:      Result Value   Glucose, Bld 120 (*)    BUN 45 (*)    Creatinine, Ser 1.91 (*)    Calcium 8.5 (*)    GFR, Estimated 25 (*)    All other components within normal limits  CBC - Abnormal; Notable for the following components:   RBC 3.35 (*)    Hemoglobin 10.5  (*)    HCT 32.0 (*)    Platelets 134 (*)    All other components within normal limits  HEPATIC FUNCTION PANEL - Abnormal; Notable for the following components:   Total Protein 6.1 (*)    All other components within normal limits  PROTIME-INR - Abnormal; Notable for the following components:   Prothrombin Time 19.3 (*)    INR 1.6 (*)    All other components within normal limits  D-DIMER, QUANTITATIVE - Abnormal; Notable for the following components:   D-Dimer, Quant 1.73 (*)    All other components within normal limits  HEPARIN LEVEL (UNFRACTIONATED) - Abnormal; Notable for the following components:   Heparin Unfractionated <0.10 (*)    All other components within normal limits  BRAIN NATRIURETIC PEPTIDE - Abnormal; Notable for the following components:   B Natriuretic Peptide 407.5 (*)    All other components within normal limits  LIPID PANEL -  Abnormal; Notable for the following components:   HDL 29 (*)    All other components within normal limits  BASIC METABOLIC PANEL - Abnormal; Notable for the following components:   Glucose, Bld 147 (*)    BUN 43 (*)    Creatinine, Ser 1.71 (*)    Calcium 8.2 (*)    GFR, Estimated 28 (*)    All other components within normal limits  HEPARIN LEVEL (UNFRACTIONATED) - Abnormal; Notable for the following components:   Heparin Unfractionated 0.18 (*)    All other components within normal limits  GLUCOSE, CAPILLARY - Abnormal; Notable for the following components:   Glucose-Capillary 129 (*)    All other components within normal limits  TROPONIN I (HIGH SENSITIVITY) - Abnormal; Notable for the following components:   Troponin I (High Sensitivity) 57 (*)    All other components within normal limits  TROPONIN I (HIGH SENSITIVITY) - Abnormal; Notable for the following components:   Troponin I (High Sensitivity) 66 (*)    All other components within normal limits  TROPONIN I (HIGH SENSITIVITY) - Abnormal; Notable for the following components:    Troponin I (High Sensitivity) 84 (*)    All other components within normal limits  TROPONIN I (HIGH SENSITIVITY) - Abnormal; Notable for the following components:   Troponin I (High Sensitivity) 77 (*)    All other components within normal limits  MRSA NEXT GEN BY PCR, NASAL  LIPASE, BLOOD  APTT  TSH  MAGNESIUM  LACTIC ACID, PLASMA  CBC  COMPREHENSIVE METABOLIC PANEL  HEPARIN LEVEL (UNFRACTIONATED)     EKG Atrial fib, VR 76. QRS 163, QTc 496. IVCD. No acute ST elevations.   RADIOLOGY Venous US: No DVT CT Chest: Mild GGO, trace bl pleural effusions CXR: Cardiomegaly, mild edema   I also independently reviewed and agree with radiologist interpretations.   PROCEDURES:  Critical Care performed: Yes, see critical care procedure note(s)  .Critical Care  Performed by: Shaune Pollack, MD Authorized by: Shaune Pollack, MD   Critical care provider statement:    Critical care time (minutes):  35   Critical care time was exclusive of:  Separately billable procedures and treating other patients   Critical care was necessary to treat or prevent imminent or life-threatening deterioration of the following conditions:  Circulatory failure, cardiac failure and respiratory failure   Critical care was time spent personally by me on the following activities:  Blood draw for specimens, development of treatment plan with patient or surrogate, pulse oximetry, evaluation of patient's response to treatment and examination of patient   I assumed direction of critical care for this patient from another provider in my specialty: no     Care discussed with: admitting provider       MEDICATIONS ORDERED IN ED: Medications  heparin bolus via infusion 3,800 Units (3,800 Units Intravenous Bolus from Bag 07/28/23 1123)    Followed by  heparin ADULT infusion 100 units/mL (25000 units/274mL) (900 Units/hr Intravenous Rate/Dose Change 07/28/23 2113)  clopidogrel (PLAVIX) tablet 75 mg (75 mg Oral  Given 07/28/23 1546)  aspirin EC tablet 81 mg (81 mg Oral Given 07/28/23 1546)  influenza vaccine adjuvanted (FLUAD) injection 0.5 mL (has no administration in time range)  sodium chloride flush (NS) 0.9 % injection 3 mL (3 mLs Intravenous Given 07/28/23 2248)  acetaminophen (TYLENOL) tablet 650 mg (has no administration in time range)    Or  acetaminophen (TYLENOL) suppository 650 mg (has no administration in time range)  HYDROmorphone (DILAUDID) injection  0.5-1 mg (has no administration in time range)  polyethylene glycol (MIRALAX / GLYCOLAX) packet 17 g (has no administration in time range)  ondansetron (ZOFRAN) tablet 4 mg (has no administration in time range)    Or  ondansetron (ZOFRAN) injection 4 mg (has no administration in time range)  hydrALAZINE (APRESOLINE) injection 5 mg (has no administration in time range)  nitroGLYCERIN (NITROSTAT) SL tablet 0.4 mg (0.4 mg Sublingual Given 07/28/23 2256)  EPINEPHrine (ADRENALIN) 5 mg in NS 250 mL (0.02 mg/mL) premix infusion (has no administration in time range)  levothyroxine (SYNTHROID) tablet 137 mcg (has no administration in time range)  atorvastatin (LIPITOR) tablet 40 mg (has no administration in time range)  pantoprazole (PROTONIX) EC tablet 40 mg (has no administration in time range)  sertraline (ZOLOFT) tablet 25 mg (has no administration in time range)  alum & mag hydroxide-simeth (MAALOX/MYLANTA) 200-200-20 MG/5ML suspension 30 mL (has no administration in time range)  melatonin tablet 2.5 mg (2.5 mg Oral Given 07/28/23 2248)  nitroGLYCERIN (NITROGLYN) 2 % ointment 1 inch (1 inch Topical Given 07/28/23 1103)  fentaNYL (SUBLIMAZE) injection 25 mcg (25 mcg Intravenous Given 07/28/23 1100)  morphine (PF) 2 MG/ML injection 2 mg (2 mg Intravenous Given 07/28/23 1229)  ondansetron (ZOFRAN) injection 4 mg (4 mg Intravenous Given 07/28/23 1229)  morphine (PF) 2 MG/ML injection 2 mg (2 mg Intravenous Given 07/28/23 1402)  sodium chloride  0.9 % bolus 500 mL (500 mLs Intravenous New Bag/Given 07/28/23 1908)  morphine (PF) 2 MG/ML injection 1 mg (1 mg Intravenous Given 07/28/23 1910)  heparin bolus via infusion 1,900 Units (1,900 Units Intravenous Bolus from Bag 07/28/23 2131)     IMPRESSION / MDM / ASSESSMENT AND PLAN / ED COURSE  I reviewed the triage vital signs and the nursing notes.                              Differential diagnosis includes, but is not limited to, ACS, dissection, PNA, atelectasis, PE, PTX  Patient's presentation is most consistent with acute presentation with potential threat to life or bodily function.  The patient is on the cardiac monitor to evaluate for evidence of arrhythmia and/or significant heart rate changes   89 yo F here with chest pain. Pt has h/o CAD. Clinically, pt appears uncomfortable with pain suggestive of ACS. EKG shows IVCD but no overt ST elevations. Trop above her baseline, though only minimally elevated. LFTs normal. Lipase normal. CBC unremarkable. CXR shows mild edema. She is hypertensive. Query ACS, vs hypertensive urgency, vs atypical PE presentation. Unable to get contrast due to CKD. Will place on heparin, admit. Pain is not c/w dissection. Dr. Mariah Milling consulted.   FINAL CLINICAL IMPRESSION(S) / ED DIAGNOSES   Final diagnoses:  Atypical chest pain  NSTEMI (non-ST elevated myocardial infarction) (HCC)     Rx / DC Orders   ED Discharge Orders     None        Note:  This document was prepared using Dragon voice recognition software and may include unintentional dictation errors.   Shaune Pollack, MD 07/28/23 [phone]

## 2023-07-28 NOTE — Consult Note (Signed)
PHARMACY - ANTICOAGULATION CONSULT NOTE  Pharmacy Consult for IV Heparin Indication: chest pain/ACS  Patient Measurements: Height: 5\' 2"  (157.5 cm) Weight: 67.7 kg (149 lb 4 oz) IBW/kg (Calculated) : 50.1 Heparin Dosing Weight: 62.9 kg  Labs: Recent Labs    07/28/23 1009 07/28/23 1116 07/28/23 1225 07/28/23 1627 07/28/23 1911 07/28/23 1914  HGB 10.5*  --   --   --   --   --   HCT 32.0*  --   --   --   --   --   PLT 134*  --   --   --   --   --   APTT  --  32  --   --   --   --   LABPROT  --  19.3*  --   --   --   --   INR  --  1.6*  --   --   --   --   HEPARINUNFRC  --  <0.10*  --   --  0.18*  --   CREATININE 1.91*  --   --   --   --  1.71*  TROPONINIHS 57*  --  66* 84*  --  77*   Estimated Creatinine Clearance: 19.7 mL/min (A) (by C-G formula based on SCr of 1.71 mg/dL (H)).  Medical History: Past Medical History:  Diagnosis Date   Anemia    C. difficile diarrhea    CHF (congestive heart failure) (HCC) 10/05/2021   Diverticulitis    GERD (gastroesophageal reflux disease)    Hyperlipidemia    Hypertension    Hypothyroidism    Myocardial infarction (HCC) 08/2017   TIA (transient ischemic attack)    1 approx 2015, 1 approx 2018   Vertigo    last episode several months ago   [name]    Medications:  Medication reconciliation is pending. No apparent anticoagulation prior to admission  Assessment: 89 y/o F with medical history as above presenting to the ED with chest pain radiating to the back. Pharmacy consulted to initiate and manage heparin infusion for suspected ACS.  10/24 1911 HL 0.18   Goal of Therapy:  Heparin level 0.3-0.7 units/ml Monitor platelets by anticoagulation protocol: Yes   Plan:  Heparin level subtherapeutic. Will give heparin bolus of 1900 units x 1 and increase heparin infusion to 900 units/hr. Recheck heparin level in 8 hours. CBC daily while on heparin.   Ronnald Ramp, PharmD, BCPS 07/28/2023,8:48 PM

## 2023-07-28 NOTE — Consult Note (Signed)
PHARMACY - ANTICOAGULATION CONSULT NOTE  Pharmacy Consult for IV Heparin Indication: chest pain/ACS  Patient Measurements: Height: 5\' 2"  (157.5 cm) Weight: 63.5 kg (140 lb) IBW/kg (Calculated) : 50.1 Heparin Dosing Weight: 62.9 kg  Labs: Recent Labs    07/28/23 1009  HGB 10.5*  HCT 32.0*  PLT 134*  CREATININE 1.91*  TROPONINIHS 57*   Estimated Creatinine Clearance: 17.2 mL/min (A) (by C-G formula based on SCr of 1.91 mg/dL (H)).  Medical History: Past Medical History:  Diagnosis Date   Anemia    C. difficile diarrhea    CHF (congestive heart failure) (HCC) 10/05/2021   Diverticulitis    GERD (gastroesophageal reflux disease)    Hyperlipidemia    Hypertension    Hypothyroidism    Myocardial infarction (HCC) 08/2017   TIA (transient ischemic attack)    1 approx 2015, 1 approx 2018   Vertigo    last episode several months ago   [name]    Medications:  Medication reconciliation is pending. No apparent anticoagulation prior to admission  Assessment: 89 y/o F with medical history as above presenting to the ED with chest pain radiating to the back. Pharmacy consulted to initiate and manage heparin infusion for suspected ACS.  Baseline CBC notable for Hgb 10.5, platelets 134. Baseline aPTT and PT-INR are pending  Goal of Therapy:  Heparin level 0.3-0.7 units/ml Monitor platelets by anticoagulation protocol: Yes   Plan:  --Heparin 3800 unit IV bolus followed by continuous infusion at 750 units/hr --Heparin level 8 hours from initiation of infusion --Daily CBC per protocol while on IV heparin  Tressie Ellis 07/28/2023,11:09 AM

## 2023-07-28 NOTE — Assessment & Plan Note (Signed)
Patient has a history of HFrEF with last EF of 40% with global hypokinesis in the setting of ischemic cardiomyopathy.She does not appear markedly hypervolemic at this time with no evidence of peripheral edema or JVD.  - Telemetry monitoring - Resume home torsemide - Echocardiogram pending - Daily weights

## 2023-07-28 NOTE — Significant Event (Signed)
Rapid Response Event Note   Reason for Call :  Symptomatic bradycardia  Initial Focused Assessment:  Rapid response RN arrived in patient's room with patient lying in bed surrounded by 2A staff. Patient alert but eyes closed, pale. Patient's HR 29-30 upon monitor, patient reports feeling "awful" with 10/10 chest pain. Patient's RN with prn nitroglycerin but getting BP prior to admin. BP 70/37. Dr. Huel Cote arrived at patient's bedside and agreed with holding nitroglycerin dose due to low BP. Oxygen saturations 96-99% on 2L nasal cannula, RR 16-20 throughout the entirety of rapid response.  Interventions:  Per orders from Dr. Huel Cote, one amp of atropine given IV push. Brought heart rate into low 60s/upper 50s, BP 121/51, but no change in chest pain for patient. Second dose of atropine administered per orders, heart rate in mid 50s before and after admin, however recheck on BP showed HR 108/48. Again no change in chest pain. 12 lead EKG obtained showing Junctional rhythm in 50s. Dr. Huel Cote spoke with cardiology on call who had returned earlier page. Per orders 1mg  of morphine given for patient's chest pain, minimal improvement to chest pain. Recheck BP 83/41, 500 mL bolus of normal saline started per orders.  Plan of Care:  Patient transferred to ICU 14 per orders from Dr. Huel Cote. This RN helped transport patient to new room, where [employer] gave report to [employer] in ICU. Family escorted to ICU waiting room by chaplain.  Event Summary:   MD Notified: Dr. Huel Cote Call Time: 18:48 Arrival Time: 18:50 End Time: 19:20  Stephanie Charles, Theresia Lo, RN

## 2023-07-28 NOTE — Consult Note (Signed)
NAME:  Stephanie Charles, MRN:  [id number], DOB:  Apr 12, 1930, LOS: 0 ADMISSION DATE:  07/28/2023, CONSULTATION DATE:  07/28/23 REFERRING MD:  Dr. Huel Cote, CHIEF COMPLAINT:  Chest Pain   History of Present Illness:  89 year old female presenting to [facility] ED from home for evaluation of chest pain.  History obtained per chart review and patient bedside report. Patient was in her normal state of health until 07/28/2023 when she awoke with sudden onset 10 out of 10 chest pain that was central and radiating straight to her back.  She also endorsed correlating nausea and shortness of breath as if she was going to pass out.  She denied similar episodes in the past stating her prior heart attacks are much less painful.  She also denied lower extremity swelling, palpitations.  She denies blurred vision, falls, headache, dizziness.  She also denies any signs or symptoms of infection including fever/chills, vomiting/abdominal pain/diarrhea, urinary symptoms. She noted that after receiving nitroglycerin her chest pain severity reduced to 5 out of 10 but has been constantly present.  The patient and her family members agreed that she has been under a great deal of stress lately worried about one of her daughters who was recently hospitalized and requiring ongoing monitoring and care from [facility].  ED course: Upon arrival patient hypertensive otherwise stable vital signs, alert and responsive.  Labs significant for anemia that appears to be baseline, mild thrombocytopenia, elevated but flat troponin, elevated BNP, D-dimer elevated at 1.73, INR 1.6 and AKI.  Imaging revealed cardiomegaly with groundglass opacities bilateral lower lobes secondary to atelectasis versus edema versus infection and trace bilateral pleural effusions.  Cardiology consulted recommending echocardiogram and heparin infusion and TRH consulted for admission.  Hospital course: After admission rapid response called due to bradycardia.  Patient  reported severe chest pain, heart rate bradycardic into the 20s with correlating hypotension with SBP in the mid 80s.  1 dose of atropine administered with improvement of rates and blood pressure as well as small IV fluid bolus.  Situation discussed with cardiology on-call.  Patient had received outpatient cardiac medications as well as carvedilol earlier in the day.  Recommendations given for possible transcutaneous pacing if indicated.  Patient transferred to ICU, PCCM consulted. Initial Vitals: 97.7, 20, 72, 198/82 & 97% on RA Significant labs: (Labs/ Imaging personally reviewed) I, Cheryll Cockayne Rust-Chester, AGACNP-BC, personally viewed and interpreted this ECG. EKG Interpretation: Date: 07/28/2023, EKG Time: 10:10, Rate: 70, Rhythm: NSR, QRS Axis: Normal, Intervals: First-degree heart block LBBB, ST/T Wave abnormalities: Inferior T wave inversions, Narrative Interpretation: NSR with first-degree heart block and LBBB as well as inferior T wave inversions Chemistry: Na+: 140 K+: 4.0, BUN/Cr.:  45/1.91, Serum CO2/ AG: 22/9 Hematology: WBC: 6.2, Hgb: 10.5, plt: 134 Troponin: 57 > 66 > 84, BNP: 407.5  CXR 07/28/2023: Cardiomegaly with probable mild pulmonary interstitial edema.  Suspected trace bilateral pleural effusions with retrocardiac opacity likely reflecting atelectasis.  Superimposed infection is not excluded CT chest without contrast 07/28/2023: Mild groundglass opacities of the bilateral lower lobes could reflect atelectasis versus infection or aspiration.  Trace bilateral pleural effusions.  Dilated pulmonary artery findings can be seen in the setting of pulmonary hypertension, cardiomegaly  PCCM consulted for assistance in management and monitoring due to significant bradycardia with correlating hypertension in the setting of NSTEMI and possible need for transcutaneous pacing/vasopressor support.  Pertinent  Medical History   Hyperlipidemia Hypertension CHF TIA MI Hypothyroidism Vertigo C. Difficile Anemia  Significant Hospital Events: Including procedures, antibiotic start  and stop dates in addition to other pertinent events   07/28/2023: Patient admitted to PCU by inpatient medicine for suspected NSTEMI.  After admission patient became significantly bradycardic into the 20s with correlating hypotension requiring atropine, transferred urgently to ICU for possible TC pacing/vasopressor support.  PCCM consulted.  Goals of care discussion patient changed status to DNR/DNI  Interim History / Subjective:  Patient alert and responsive, vital signs on initial arrival revealed soft hemodynamics which corrected quickly.  Currently rhythm NSR in the 60s with stable BP. -Plan of care discussed with daughters and niece bedside, all questions and concerns answered at this time  Objective   Blood pressure (!) 149/48, pulse (!) 51, temperature (!) 97.1 F (36.2 C), temperature source Oral, resp. rate 20, height 5\' 2"  (1.575 m), weight 67.7 kg, SpO2 95%.        Intake/Output Summary (Last 24 hours) at 07/28/2023 2009 Last data filed at 07/28/2023 1651 Gross per 24 hour  Intake 78.45 ml  Output --  Net 78.45 ml   Filed Weights   07/28/23 1005 07/28/23 1930  Weight: 63.5 kg 67.7 kg    Examination: General: Adult female, acutely ill, lying in bed, NAD HEENT: MM pink/moist, anicteric, atraumatic, neck supple Neuro: A&O x 4, able to follow commands, PERRL +3, MAE CV: s1s2 RRR, 1st degree HB on monitor, no r/m/g Pulm: Regular, non labored on RA, breath sounds diminished - BUL, fine crackles- BLL GI: soft, rounded, non tender, bs x 4 GU: foley in place with clear yellow urine Skin: no rashes/lesions noted Extremities: warm/dry, pulses + 2 R/P, no edema noted  Resolved Hospital Problem list     Assessment & Plan:  NSTEMI Query Takotsubo's Bradycardia and correlating circulatory  shock HFrEF Hyperlipidemia PMHx: MI, CAD, hypertension -Hold outpatient antihypertensive medications and carvedilol, manage BP with hydralazine as needed.  Consider restarting outpatient regimen as patient stabilizes -Cardiology following appreciate input -Continuous cardiac monitoring -Continue heparin infusion per pharmacy protocol -Echocardiogram ordered -TC pacing as needed to maintain heart rate > 40 bpm, consider utilizing epinephrine drip initially to hopefully avoid TC pacing -VQ scan pending -Continue DAPT and statin -Daily weights - NTG & dilaudid for chest pain control as hemodynamics allow  CKD stage IV  - Strict I/O's: alert provider if UOP < 0.5 mL/kg/hr - gentle IVF hydration  - Daily BMP, replace electrolytes PRN - Avoid nephrotoxic agents as able, ensure adequate renal perfusion  Best Practice (right click and "Reselect all SmartList Selections" daily)  Diet/type: Regular consistency (see orders) DVT prophylaxis: systemic heparin GI prophylaxis: N/A Lines: N/A Foley:  N/A Code Status:  DNR Last date of multidisciplinary goals of care discussion [07/28/23]  Labs   CBC: Recent Labs  Lab 07/28/23 1009  WBC 6.2  HGB 10.5*  HCT 32.0*  MCV 95.5  PLT 134*    Basic Metabolic Panel: Recent Labs  Lab 07/28/23 1009 07/28/23 1914  NA 140 137  K 4.0 4.1  CL 109 108  CO2 22 22  GLUCOSE 120* 147*  BUN 45* 43*  CREATININE 1.91* 1.71*  CALCIUM 8.5* 8.2*  MG  --  2.0   GFR: Estimated Creatinine Clearance: 19.7 mL/min (A) (by C-G formula based on SCr of 1.71 mg/dL (H)). Recent Labs  Lab 07/28/23 1009 07/28/23 1914  WBC 6.2  --   LATICACIDVEN  --  1.2    Liver Function Tests: Recent Labs  Lab 07/28/23 1116  AST 21  ALT 15  ALKPHOS 75  BILITOT 0.4  PROT 6.1*  ALBUMIN 3.5   Recent Labs  Lab 07/28/23 1116  LIPASE 34   No results for input(s): "AMMONIA" in the last 168 hours.  ABG No results found for: "PHART", "PCO2ART", "PO2ART",  "HCO3", "TCO2", "ACIDBASEDEF", "O2SAT"   Coagulation Profile: Recent Labs  Lab 07/28/23 1116  INR 1.6*    Cardiac Enzymes: No results for input(s): "CKTOTAL", "CKMB", "CKMBINDEX", "TROPONINI" in the last 168 hours.  HbA1C: Hemoglobin A1C  Date/Time Value Ref Range Status  10/19/2011 03:23 AM 5.5 4.2 - 6.3 % Final    Comment:    The American Diabetes Association recommends that a primary goal of therapy should be <7% and that physicians should reevaluate the treatment regimen in patients with HbA1c values consistently >8%.    Hgb A1c MFr Bld  Date/Time Value Ref Range Status  03/07/2021 05:14 AM 5.6 4.8 - 5.6 % Final    Comment:    (NOTE)         Prediabetes: 5.7 - 6.4         Diabetes: >6.4         Glycemic control for adults with diabetes: <7.0     CBG: No results for input(s): "GLUCAP" in the last 168 hours.  Review of Systems: Positives in BOLD  Gen: Denies fever, chills, weight change, fatigue, night sweats HEENT: Denies blurred vision, double vision, hearing loss, tinnitus, sinus congestion, rhinorrhea, sore throat, neck stiffness, dysphagia PULM: Denies shortness of breath, cough, sputum production, hemoptysis, wheezing CV: Denies chest pain, edema, orthopnea, paroxysmal nocturnal dyspnea, palpitations GI: Denies abdominal pain, nausea, vomiting, diarrhea, hematochezia, melena, constipation, change in bowel habits GU: Denies dysuria, hematuria, polyuria, oliguria, urethral discharge Endocrine: Denies hot or cold intolerance, polyuria, polyphagia or appetite change Derm: Denies rash, dry skin, scaling or peeling skin change Heme: Denies easy bruising, bleeding, bleeding gums Neuro: Denies headache, numbness, weakness, slurred speech, loss of memory or consciousness  Past Medical History:  She,  has a past medical history of Anemia, C. difficile diarrhea, CHF (congestive heart failure) (HCC) (10/05/2021), Diverticulitis, GERD (gastroesophageal reflux disease),  Hyperlipidemia, Hypertension, Hypothyroidism, Myocardial infarction (HCC) (08/2017), TIA (transient ischemic attack), Vertigo, and [name].   Surgical History:   Past Surgical History:  Procedure Laterality Date   COLONOSCOPY WITH PROPOFOL N/A 06/22/2018   Procedure: COLONOSCOPY WITH PROPOFOL;  Surgeon: Wyline Mood, MD;  Location: [facility] ENDOSCOPY;  Service: Endoscopy;  Laterality: N/A;   CORONARY STENT INTERVENTION N/A 06/07/2019   Procedure: CORONARY STENT INTERVENTION;  Surgeon: Iran Ouch, MD;  Location: [facility] INVASIVE CV LAB;  Service: Cardiovascular;  Laterality: N/A;   ESOPHAGOGASTRODUODENOSCOPY (EGD) WITH PROPOFOL N/A 06/22/2018   Procedure: ESOPHAGOGASTRODUODENOSCOPY (EGD) WITH PROPOFOL;  Surgeon: Wyline Mood, MD;  Location: [facility] ENDOSCOPY;  Service: Endoscopy;  Laterality: N/A;   KNEE ARTHROSCOPY Right    REPLACEMENT TOTAL KNEE BILATERAL     RIGHT/LEFT HEART CATH AND CORONARY ANGIOGRAPHY N/A 06/07/2019   Procedure: RIGHT/LEFT HEART CATH AND CORONARY ANGIOGRAPHY;  Surgeon: Antonieta Iba, MD;  Location: [facility] INVASIVE CV LAB;  Service: Cardiovascular;  Laterality: N/A;   SHOULDER SURGERY Left      Social History:   reports that she has never smoked. She has never used smokeless tobacco. She reports that she does not drink alcohol and does not use drugs.   Family History:  Her family history includes Alzheimer's disease in her brother and brother; Breast cancer in her sister; Cancer in her brother; Clotting disorder in her daughter; Fibromyalgia in  her daughter; Heart attack in her brother; Heart disease in her brother, brother, father, and mother; Hypertension in her mother; Mental illness in her brother; Stroke in her brother and mother.   Allergies Allergies  Allergen Reactions   Codeine Nausea And Vomiting   Levofloxacin Other (See Comments)    Mouth sores   Lisinopril Other (See Comments)    Cough?   Other Itching   Povidone-Iodine Other (See  Comments) and Rash    Severe blistering and itchiness. Severe redness   Simvastatin Other (See Comments)    Myalgias   Latex Itching     Home Medications  Prior to Admission medications   Medication Sig Start Date End Date Taking? Authorizing Provider  albuterol (PROVENTIL) (2.5 MG/3ML) 0.083% nebulizer solution USE 1 VIAL IN NEBULIZER EVERY 4 HOURS AS NEEDED 01/04/22  Yes Bosie Clos, MD  allopurinol (ZYLOPRIM) 300 MG tablet Take 1 tablet by mouth once daily 12/29/21  Yes Bosie Clos, MD  amitriptyline (ELAVIL) 10 MG tablet Take 1 tablet (10 mg total) by mouth at bedtime. 12/27/22  Yes Malva Limes, MD  ASPIRIN LOW DOSE 81 MG EC tablet NEW PRESCRIPTION REQUEST: TAKE ONE TABLET BY MOUTH EVERY DAY 08/03/21  Yes Bosie Clos, MD  atorvastatin (LIPITOR) 40 MG tablet Take 1 tablet by mouth once daily 04/13/23  Yes Gollan, Tollie Pizza, MD  carvedilol (COREG) 12.5 MG tablet Take 1 tablet by mouth twice daily 04/13/23  Yes Gollan, Tollie Pizza, MD  clopidogrel (PLAVIX) 75 MG tablet Take 1 tablet by mouth once daily 04/13/23  Yes Gollan, Tollie Pizza, MD  Ferrous Sulfate 27 MG TABS Take 65 mg by mouth at bedtime.   Yes [provider]  hydrALAZINE (APRESOLINE) 50 MG tablet TAKE 1 TABLET (50 MG) THREE TIMES A DAY, FOR HIGH BLOOD PRESSURE >150 TAKE EXTRA HYDRALAZINE 05/06/23  Yes Gollan, Tollie Pizza, MD  hydrocortisone 2.5 % ointment Apply 1 Application topically 2 (two) times daily. 05/05/23  Yes [provider]  isosorbide mononitrate (IMDUR) 30 MG 24 hr tablet TAKE 1 TABLET BY MOUTH ONCE DAILY. FOR HIGH BLOOD PRESSURE GREATER THAN 150 CAN TAKE EXTRA ISOSORBIDE. 06/17/23  Yes Gollan, Tollie Pizza, MD  levothyroxine (SYNTHROID) 137 MCG tablet Take 1 tablet (137 mcg total) by mouth daily before breakfast. 04/12/22  Yes Bosie Clos, MD  Melatonin 10 MG TABS Take 20 mg by mouth at bedtime.   Yes [provider]  mirtazapine (REMERON) 30 MG tablet TAKE 1 TABLET BY MOUTH AT  BEDTIME 04/21/22  Yes Bosie Clos, MD  Multiple Vitamin (MULTIVITAMIN) capsule Take 1 capsule by mouth every evening.   Yes [provider]  nitroGLYCERIN (NITROSTAT) 0.4 MG SL tablet Place 1 tablet (0.4 mg total) under the tongue every 5 (five) minutes as needed for chest pain. 04/09/22  Yes Gollan, Tollie Pizza, MD  pantoprazole (PROTONIX) 40 MG tablet Take 1 tablet (40 mg total) by mouth daily. 02/18/23  Yes Dunn, Raymon Mutton, PA-C  potassium chloride (KLOR-CON) 10 MEQ tablet Take potassium 20 meq when you take torsemide Patient taking differently: Take 20 mEq by mouth See admin instructions. Take potassium 20 meq when you take torsemide 10/09/21  Yes Gollan, Tollie Pizza, MD  sacubitril-valsartan (ENTRESTO) 97-103 MG Take 1 tablet by mouth 2 (two) times daily. 12/10/22  Yes Antonieta Iba, MD  sertraline (ZOLOFT) 25 MG tablet Take 1 tablet by mouth once daily 04/21/22  Yes Bosie Clos, MD  torsemide (DEMADEX) 20 MG  tablet Take 1 tablet (20 mg total) by mouth daily as needed. For weight 153 or higher with a potassium tab 04/09/22  Yes Gollan, Tollie Pizza, MD  fidaxomicin (DIFICID) 200 MG TABS tablet Take 200 mg by mouth 2 (two) times daily with a meal. Take 1 tablet (200 mg total) by mouth Two (2) times a day. Patient not taking: Reported on 11/09/2022 02/18/22   [provider]  fluticasone (FLONASE) 50 MCG/ACT nasal spray Place 2 sprays into both nostrils daily. Patient not taking: Reported on 02/09/2023 02/23/22   Bosie Clos, MD     Critical care time: 65 minutes     Betsey Holiday, AGACNP-BC Acute Care Nurse Practitioner [facility]   [phone] / [phone] Please see Amion for details.

## 2023-07-28 NOTE — Progress Notes (Signed)
Responded to over head page of rapid response. On arrival [facility] ICU CN present, pt nurse, charge rn present. Pt on monitor, had been given a dose of atropine. Continued brady cardic, atropine given again. Provider in room. Pulse palpable, able to maintain airway. Was told pt had been awakened by chest pain. 12 lead EKG done, MD present in room, Dr. Huel Cote. She had contacted on call cardiology about the situation.

## 2023-07-28 NOTE — H&P (Addendum)
History and Physical    Patient: Stephanie Charles NWG:[id number] DOB: 1930/07/06 DOA: 07/28/2023 DOS: the patient was seen and examined on 07/28/2023 PCP: Bosie Clos, MD  Patient coming from: Home  Chief Complaint:  Chief Complaint  Patient presents with   Chest Pain   HPI: Stephanie Charles is a 89 y.o. female with medical history significant of Coronary artery disease s/p PCI with DES to proximal/mid LAD, HFrEF with last EF of 35-40% 2/2 ischemic cardiomyopathy, CKD stage IIIb, hyperlipidemia, hypertension, hypothyroidism, TIA, anemia, who presents to the ED due to chest pain.  [name] states that she was in her usual state of health yesterday when she awoke today with sudden onset chest pain that was central and radiating straight back to her back.  At the time, she stated she was also experiencing nausea with shortness of breath and felt as though she was going to pass out.  She denies any similar episodes in the past stating her prior heart attacks were much less painful.  At this time, she denies any lower extremity swelling, palpitations.  She notes that after receiving nitroglycerin, her chest pain severity has reduced somewhat but is still present.  [name] states she has been very stressed lately as her daughter was recently hospitalized and has required frequent follow-up visits at [facility], and she has been going with her to these appointments  ED course: On arrival to the ED, patient was hypertensive at 198/82 with heart rate of 74.  She is saturating at 97% on room air she was afebrile at 97.7.  Initial workup notable for hemoglobin of 10.5, platelets of 134, glucose 120, BUN 45, creatinine 1.91 with GFR of 25.  BNP elevated at 407, troponin 57 and then 66.  D-dimer elevated at 1.73.  INR 1.6.  Bilateral lower extremity duplex studies ordered and CT chest.  Cardiology consulted; recommending echocardiogram and heparin infusion.  TRH contacted for admission.   Review of  Systems: As mentioned in the history of present illness. All other systems reviewed and are negative.  Past Medical History:  Diagnosis Date   Anemia    C. difficile diarrhea    CHF (congestive heart failure) (HCC) 10/05/2021   Diverticulitis    GERD (gastroesophageal reflux disease)    Hyperlipidemia    Hypertension    Hypothyroidism    Myocardial infarction (HCC) 08/2017   TIA (transient ischemic attack)    1 approx 2015, 1 approx 2018   Vertigo    last episode several months ago   [name]    Past Surgical History:  Procedure Laterality Date   COLONOSCOPY WITH PROPOFOL N/A 06/22/2018   Procedure: COLONOSCOPY WITH PROPOFOL;  Surgeon: Wyline Mood, MD;  Location: [facility] ENDOSCOPY;  Service: Endoscopy;  Laterality: N/A;   CORONARY STENT INTERVENTION N/A 06/07/2019   Procedure: CORONARY STENT INTERVENTION;  Surgeon: Iran Ouch, MD;  Location: [facility] INVASIVE CV LAB;  Service: Cardiovascular;  Laterality: N/A;   ESOPHAGOGASTRODUODENOSCOPY (EGD) WITH PROPOFOL N/A 06/22/2018   Procedure: ESOPHAGOGASTRODUODENOSCOPY (EGD) WITH PROPOFOL;  Surgeon: Wyline Mood, MD;  Location: [facility] ENDOSCOPY;  Service: Endoscopy;  Laterality: N/A;   KNEE ARTHROSCOPY Right    REPLACEMENT TOTAL KNEE BILATERAL     RIGHT/LEFT HEART CATH AND CORONARY ANGIOGRAPHY N/A 06/07/2019   Procedure: RIGHT/LEFT HEART CATH AND CORONARY ANGIOGRAPHY;  Surgeon: Antonieta Iba, MD;  Location: [facility] INVASIVE CV LAB;  Service: Cardiovascular;  Laterality: N/A;   SHOULDER SURGERY Left  Social History:  reports that she has never smoked. She has never used smokeless tobacco. She reports that she does not drink alcohol and does not use drugs.  Allergies  Allergen Reactions   Codeine Nausea And Vomiting   Levofloxacin Other (See Comments)    Mouth sores   Lisinopril Other (See Comments)    Cough?   Other Itching   Povidone-Iodine Other (See Comments) and Rash    Severe blistering and itchiness. Severe  redness   Simvastatin Other (See Comments)    Myalgias   Latex Itching    Family History  Problem Relation Age of Onset   Heart disease Mother    Hypertension Mother    Stroke Mother    Heart disease Father    Breast cancer Sister    Alzheimer's disease Brother    Heart attack Brother    Stroke Brother    Heart disease Brother    Mental illness Brother        died suicide   Cancer Brother        prostate   Heart disease Brother        atrial fib   Alzheimer's disease Brother    Clotting disorder Daughter    Fibromyalgia Daughter     Prior to Admission medications   Medication Sig Start Date End Date Taking? Authorizing Provider  albuterol (PROVENTIL) (2.5 MG/3ML) 0.083% nebulizer solution USE 1 VIAL IN NEBULIZER EVERY 4 HOURS AS NEEDED 01/04/22  Yes Bosie Clos, MD  allopurinol (ZYLOPRIM) 300 MG tablet Take 1 tablet by mouth once daily 12/29/21  Yes Bosie Clos, MD  amitriptyline (ELAVIL) 10 MG tablet Take 1 tablet (10 mg total) by mouth at bedtime. 12/27/22  Yes Malva Limes, MD  ASPIRIN LOW DOSE 81 MG EC tablet NEW PRESCRIPTION REQUEST: TAKE ONE TABLET BY MOUTH EVERY DAY 08/03/21  Yes Bosie Clos, MD  atorvastatin (LIPITOR) 40 MG tablet Take 1 tablet by mouth once daily 04/13/23  Yes Gollan, Tollie Pizza, MD  carvedilol (COREG) 12.5 MG tablet Take 1 tablet by mouth twice daily 04/13/23  Yes Gollan, Tollie Pizza, MD  clopidogrel (PLAVIX) 75 MG tablet Take 1 tablet by mouth once daily 04/13/23  Yes Gollan, Tollie Pizza, MD  Ferrous Sulfate 27 MG TABS Take 65 mg by mouth at bedtime.   Yes [provider]  hydrALAZINE (APRESOLINE) 50 MG tablet TAKE 1 TABLET (50 MG) THREE TIMES A DAY, FOR HIGH BLOOD PRESSURE >150 TAKE EXTRA HYDRALAZINE 05/06/23  Yes Gollan, Tollie Pizza, MD  hydrocortisone 2.5 % ointment Apply 1 Application topically 2 (two) times daily. 05/05/23  Yes [provider]  isosorbide mononitrate (IMDUR) 30 MG 24 hr tablet TAKE 1 TABLET BY MOUTH  ONCE DAILY. FOR HIGH BLOOD PRESSURE GREATER THAN 150 CAN TAKE EXTRA ISOSORBIDE. 06/17/23  Yes Gollan, Tollie Pizza, MD  levothyroxine (SYNTHROID) 137 MCG tablet Take 1 tablet (137 mcg total) by mouth daily before breakfast. 04/12/22  Yes Bosie Clos, MD  Melatonin 10 MG TABS Take 20 mg by mouth at bedtime.   Yes [provider]  mirtazapine (REMERON) 30 MG tablet TAKE 1 TABLET BY MOUTH AT BEDTIME 04/21/22  Yes Bosie Clos, MD  Multiple Vitamin (MULTIVITAMIN) capsule Take 1 capsule by mouth every evening.   Yes [provider]  nitroGLYCERIN (NITROSTAT) 0.4 MG SL tablet Place 1 tablet (0.4 mg total) under the tongue every 5 (five) minutes as needed for chest pain. 04/09/22  Yes Antonieta Iba,  MD  pantoprazole (PROTONIX) 40 MG tablet Take 1 tablet (40 mg total) by mouth daily. 02/18/23  Yes Dunn, Raymon Mutton, PA-C  potassium chloride (KLOR-CON) 10 MEQ tablet Take potassium 20 meq when you take torsemide Patient taking differently: Take 20 mEq by mouth See admin instructions. Take potassium 20 meq when you take torsemide 10/09/21  Yes Gollan, Tollie Pizza, MD  sacubitril-valsartan (ENTRESTO) 97-103 MG Take 1 tablet by mouth 2 (two) times daily. 12/10/22  Yes Antonieta Iba, MD  sertraline (ZOLOFT) 25 MG tablet Take 1 tablet by mouth once daily 04/21/22  Yes Bosie Clos, MD  torsemide (DEMADEX) 20 MG tablet Take 1 tablet (20 mg total) by mouth daily as needed. For weight 153 or higher with a potassium tab 04/09/22  Yes Gollan, Tollie Pizza, MD  fidaxomicin (DIFICID) 200 MG TABS tablet Take 200 mg by mouth 2 (two) times daily with a meal. Take 1 tablet (200 mg total) by mouth Two (2) times a day. Patient not taking: Reported on 11/09/2022 02/18/22   [provider]  fluticasone (FLONASE) 50 MCG/ACT nasal spray Place 2 sprays into both nostrils daily. Patient not taking: Reported on 02/09/2023 02/23/22   Bosie Clos, MD    Physical Exam: Vitals:   07/28/23 1545 07/28/23  1612 07/28/23 1613 07/28/23 1701  BP: (!) 188/70 (!) 193/63 (!) 183/67   Pulse: 71 62 (!) 59 74  Resp:  16    Temp:  (!) 97.5 F (36.4 C)    TempSrc:  Oral    SpO2: 95% 97%    Weight:      Height:       Physical Exam Vitals and nursing note reviewed.  Constitutional:      General: She is not in acute distress.    Appearance: She is normal weight.  HENT:     Head: Normocephalic and atraumatic.  Cardiovascular:     Rate and Rhythm: Normal rate and regular rhythm.     Pulses:          Dorsalis pedis pulses are 2+ on the right side and 1+ on the left side.     Heart sounds: No murmur heard. Pulmonary:     Effort: Pulmonary effort is normal. No respiratory distress.     Breath sounds: Rales (Minimal bibasilar) present. No wheezing or rhonchi.  Abdominal:     General: Bowel sounds are normal. There is no distension.     Palpations: Abdomen is soft.     Tenderness: There is no abdominal tenderness. There is no guarding.  Musculoskeletal:        General: No deformity.     Right lower leg: No edema.     Left lower leg: No edema.  Skin:    General: Skin is warm and dry.  Neurological:     General: No focal deficit present.     Mental Status: She is alert and oriented to person, place, and time. Mental status is at baseline.  Psychiatric:        Mood and Affect: Mood normal.        Behavior: Behavior normal.    Data Reviewed: CBC with WBC of 6.2, hemoglobin of 10.5, MCV of 95, platelets of 134 CMP with sodium of 140, potassium 4.0, bicarb 22, glucose 120, BUN 45, creatinine 1.91, AST 21, ALT 15 and GFR of 25 BNP 407 Troponin 57 and then 66 D-dimer 1.73 INR 1.6  EKG personally reviewed.  Sinus rhythm with rate of 70.  First-degree AV block.  Left bundle branch block.  Compared to EKG obtained in September 2024, T wave inversion in leads I, II, III, avF appear more prominent, but otherwise no significant changes noted.  US Venous Img Lower Bilateral  Result Date:  07/28/2023 CLINICAL DATA:  Bilateral lower extremity swelling for 2 days EXAM: Bilateral lower Extremity Venous Doppler Ultrasound TECHNIQUE: Gray-scale sonography with compression, as well as color and duplex ultrasound, were performed to evaluate the deep venous system(s) from the level of the common femoral vein through the popliteal and proximal calf veins. COMPARISON:  None available FINDINGS: VENOUS Normal compressibility of the common femoral, superficial femoral, and popliteal veins, as well as the visualized calf veins. Visualized portions of profunda femoral vein and great saphenous vein unremarkable. No filling defects to suggest DVT on grayscale or color Doppler imaging. Doppler waveforms show normal direction of venous flow, normal respiratory plasticity and response to augmentation. OTHER None. Limitations: none IMPRESSION: No  lower extremity DVT. Electronically Signed   By: Acquanetta Belling M.D.   On: 07/28/2023 16:44   CT Chest Wo Contrast  Result Date: 07/28/2023 CLINICAL DATA:  Chest pain EXAM: CT CHEST WITHOUT CONTRAST TECHNIQUE: Multidetector CT imaging of the chest was performed following the standard protocol without IV contrast. RADIATION DOSE REDUCTION: This exam was performed according to the departmental dose-optimization program which includes automated exposure control, adjustment of the mA and/or kV according to patient size and/or use of iterative reconstruction technique. COMPARISON:  Chest CT dated Feb 21, 2013 FINDINGS: Cardiovascular: Marked cardiomegaly. Trace pericardial effusion. Normal caliber thoracic aorta with severe atherosclerotic disease. Dilated main pulmonary artery, measuring up to 3.2 cm. Severe coronary artery calcifications. Mediastinum/Nodes: Small hiatal hernia. No enlarged lymph nodes seen in the chest. Lungs/Pleura: Central airways are patent. Mild linear consolidation of the anterior right upper lobe, likely due to scarring or atelectasis. Trace bilateral  pleural effusions. Mild bilateral lower lobe ground-glass opacities. Upper Abdomen: Right adrenal gland lesion measuring 2.0 cm, unchanged when compared with 2014 prior and consistent with benign adenoma, no specific follow-up imaging is necessary. No acute abnormality. Musculoskeletal: No chest wall mass or suspicious bone lesions identified. IMPRESSION: 1. Mild ground-glass opacities of the bilateral lower lobes, could be due to atelectasis, infection or aspiration. 2. Trace bilateral pleural effusions. 3. Dilated pulmonary artery, findings can be seen in the setting of pulmonary hypertension. 4. Cardiomegaly, aortic Atherosclerosis (ICD10-I70.0) and Emphysema (ICD10-J43.9). Electronically Signed   By: Allegra Lai M.D.   On: 07/28/2023 15:47   DG Chest Portable 1 View  Result Date: 07/28/2023 CLINICAL DATA:  Chest pain radiating to the back EXAM: PORTABLE CHEST 1 VIEW COMPARISON:  Chest radiograph 07/10/2022 FINDINGS: The heart is enlarged, grossly unchanged. The upper mediastinal contours are stable. Lung volumes are low. There are increased interstitial markings bilaterally likely reflecting mild pulmonary interstitial edema. There are suspected trace bilateral pleural effusions with retrocardiac opacity likely reflecting atelectasis. There is no pneumothorax There is no acute osseous abnormality. IMPRESSION: 1. Cardiomegaly with probable mild pulmonary interstitial edema. 2. Suspected trace bilateral pleural effusions with retrocardiac opacity likely reflecting atelectasis. Superimposed infection is not excluded. Electronically Signed   By: Lesia Hausen M.D.   On: 07/28/2023 11:51    Results are pending, will review when available.  Assessment and Plan:  * NSTEMI (non-ST elevated myocardial infarction) [facility]) Patient is presenting with sudden chest pain that woke her out of her sleep with a minimal elevation of her troponin compared to baseline.  EKG  with known left bundle branch block.  D-dimer  elevated.  Differential at this point includes primary ischemic event versus PE.  Lower extremity Doppler is negative.  - Cardiology consulted; appreciate their recommendations - Telemetry monitoring - Heparin infusion per pharmacy dosing - Nitroglycerin as needed for chest pain - VQ scan ordered given CKD stage IV - Echocardiogram pending - Continue to trend troponin for now  HFrEF (heart failure with reduced ejection fraction) (HCC) Patient has a history of HFrEF with last EF of 40% with global hypokinesis in the setting of ischemic cardiomyopathy.She does not appear markedly hypervolemic at this time with no evidence of peripheral edema or JVD.  - Telemetry monitoring - Resume home torsemide - Echocardiogram pending - Daily weights  CAD (coronary artery disease) Patient has a history of CAD s/p PCI with DES to mid/proximal LAD complicated by ischemic cardiomyopathy.  - Cardiology following; appreciate their recommendations - Continue DAPT and statin - As noted above heparin per pharmacy dosing  Chronic kidney disease, stage 4 (severe) (HCC) In 2024, patient's creatinine has ranged between 1.48 and 2.29, concerning with progression of her CKD stage IIIb to CKD stage IV.  Creatinine currently 1.91.  - Avoid nephrotoxic agents as able - Daily BMP - Strict in and out  Essential hypertension Patient is markedly hypertensive with chest pain, however not consistent with hypertensive emergency at this time.  Cardiology has restarted her home medications.  - Continue home Entresto, hydralazine, Coreg - Imdur started by cardiology - Hydralazine 5 mg every 8 hours as needed for SBP over 180  Bilateral leg paresthesia Per chart review, this is a chronic issue for Ms. Forstrom.  DP pulses are intact although reduced on the left.  - Vitamin B12 - Consider ABIs  Adult hypothyroidism - Continue home Synthroid - TSH pending  Goals of care, counseling/discussion Ms. Richman states  that she would want to receive CPR but would not want to have any other part of the ACLS protocol including pulmonary resuscitation.  We had a frank discussion that CPR is not effective without pulmonary resuscitation as well.  We discussed what CPR entails and pulmonary resuscitation entails.  At this time, she would like to be a full code.  Given her advanced age and multiple comorbidities, she may ultimately require palliative care consultation.  Advance Care Planning:   Code Status: Full Code please see goals of care discussion above  Consults: Cardiology  Family Communication: No family at bedside  Severity of Illness: The appropriate patient status for this patient is INPATIENT. Inpatient status is judged to be reasonable and necessary in order to provide the required intensity of service to ensure the patient's safety. The patient's presenting symptoms, physical exam findings, and initial radiographic and laboratory data in the context of their chronic comorbidities is felt to place them at high risk for further clinical deterioration. Furthermore, it is not anticipated that the patient will be medically stable for discharge from the hospital within 2 midnights of admission.   * I certify that at the point of admission it is my clinical judgment that the patient will require inpatient hospital care spanning beyond 2 midnights from the point of admission due to high intensity of service, high risk for further deterioration and high frequency of surveillance required.*  Author: Verdene Lennert, MD 07/28/2023 5:07 PM  For on call review [email or web address].

## 2023-07-28 NOTE — Progress Notes (Signed)
eLink Physician-Brief Progress Note Patient Name: Stephanie Charles DOB: Dec 26, 1929 MRN: [id number]   Date of Service  07/28/2023  HPI/Events of Note  Patient with a known history of CAD admitted with chest pain r/o acute coronary syndrome, work up and treatment in progress.  eICU Interventions  New Patient Evaluation.        Thomasene Lot [name] 07/28/2023, 10:40 PM

## 2023-07-28 NOTE — ED Triage Notes (Signed)
EMS states chest pain that radiates into back, started upon awakening this morning.   NS 1SL NTG with EMS 324mg  ASA NS  Initial HR 35, now 64 BP 95/60

## 2023-07-28 NOTE — Assessment & Plan Note (Signed)
Per chart review, this is a chronic issue for Stephanie Charles.  DP pulses are intact although reduced on the left.  - Vitamin B12 - Consider ABIs

## 2023-07-28 NOTE — Assessment & Plan Note (Signed)
Patient is markedly hypertensive with chest pain, however not consistent with hypertensive emergency at this time.  Cardiology has restarted her home medications.  - Continue home Entresto, hydralazine, Coreg - Imdur started by cardiology - Hydralazine 5 mg every 8 hours as needed for SBP over 180

## 2023-07-28 NOTE — Assessment & Plan Note (Signed)
Patient is presenting with sudden chest pain that woke her out of her sleep with a minimal elevation of her troponin compared to baseline.  EKG with known left bundle branch block.  D-dimer elevated.  Differential at this point includes primary ischemic event versus PE.  Lower extremity Doppler is negative.  - Cardiology consulted; appreciate their recommendations - Telemetry monitoring - Heparin infusion per pharmacy dosing - Nitroglycerin as needed for chest pain - VQ scan ordered given CKD stage IV - Echocardiogram pending - Continue to trend troponin for now

## 2023-07-28 NOTE — Significant Event (Addendum)
   Rapid Response Event  Rapid response called at approximately 6:45 due to bradycardia.   On arrival to patient's room, Ms. Safarian states that she does not feel well and is still having severe chest pain.  On telemetry, patient was bradycardic to as low as the mid 20s.  Patient's blood pressure was initially 108/48 but subsequently decreased further to as low as systolics in the mid 80s.  1 dose of atropine was ordered and given with improvement of rates and blood pressure.  Discussed with cardiology team, Dr. Cristal Deer.  Given patient received carvedilol earlier, recommend supportive management and transcutaneous pacing if indicated.  No further recommendations at this time.  EKG obtained and personally reviewed.  Junctional rhythm with a rate in the 40s.  Left bundle branch block still present.  Worsening hypotension noted with rate sustaining in the 40s, so additional dose of atropine given without any improvement.  IV fluids initiated. Patient was transferred to the ICU.   # Junctional Bradycardia: Unclear etiology, as patient received her home dose of Coreg.  # Hypotension: Potentially due to multiple vasodilatory agents.  - ICU consulted; appreciate their recommendations - Family updated regarding acute events - BMP, magnesium, troponin and lactic acid pending    Time spent: 45 minutes   Verdene Lennert, MD [facility] 07/28/2023, 7:43 PM

## 2023-07-28 NOTE — Assessment & Plan Note (Signed)
-   Continue home Synthroid - TSH pending 

## 2023-07-28 NOTE — Telephone Encounter (Signed)
I called and spoke with the patient's daughter, Alona Bene (ok per DPR). She advised the patient developed chest pain/ back pain last night, but Alona Bene had already gone to bed and was not aware of this until this morning. The patient advised her she was also experiencing nausea as well today. She was given NTG and EMS was called.  The patient has been transported the ED and lab results are actively pending.  Alona Bene stated the patient asked her multiple times to please contact Dr. Mariah Milling to make him aware.   I advised I will reach out to Dr. Mariah Milling to let him know that the patient is currently in the ED.  Alona Bene voices understanding and is agreeable.  Secure chat message sent to Dr. Mariah Milling to make him aware.

## 2023-07-28 NOTE — Telephone Encounter (Signed)
Patient's daughter states patient asked her to call Dr. Windell Hummingbird office to inform him that she is been transported to the hospital by EMS with possible myocardial infarction. Daughter is not currently with patient.

## 2023-07-28 NOTE — Assessment & Plan Note (Signed)
Stephanie Charles states that she would want to receive CPR but would not want to have any other part of the ACLS protocol including pulmonary resuscitation.  We had a frank discussion that CPR is not effective without pulmonary resuscitation as well.  We discussed what CPR entails and pulmonary resuscitation entails.  At this time, she would like to be a full code.  Given her advanced age and multiple comorbidities, she may ultimately require palliative care consultation.

## 2023-07-29 ENCOUNTER — Inpatient Hospital Stay (HOSPITAL_COMMUNITY)
Admit: 2023-07-29 | Discharge: 2023-07-29 | Disposition: A | Discharge status: Home / Self Care | Payer: Medicare Other | Attending: Medical

## 2023-07-29 ENCOUNTER — Inpatient Hospital Stay: Payer: Medicare Other

## 2023-07-29 ENCOUNTER — Inpatient Hospital Stay
Admit: 2023-07-29 | Discharge: 2023-07-29 | Disposition: A | Discharge status: Home / Self Care | Payer: Medicare Other | Attending: Medical | Admitting: Medical

## 2023-07-29 DIAGNOSIS — R079 Chest pain, unspecified: Secondary | ICD-10-CM

## 2023-07-29 DIAGNOSIS — I214 Non-ST elevation (NSTEMI) myocardial infarction: Secondary | ICD-10-CM | POA: Diagnosis not present

## 2023-07-29 LAB — COMPREHENSIVE METABOLIC PANEL
ALT: 13 U/L (ref 0–44)
AST: 15 U/L (ref 15–41)
Albumin: 3.3 g/dL — ABNORMAL LOW (ref 3.5–5.0)
Alkaline Phosphatase: 61 U/L (ref 38–126)
Anion gap: 7 (ref 5–15)
BUN: 40 mg/dL — ABNORMAL HIGH (ref 8–23)
CO2: 25 mmol/L (ref 22–32)
Calcium: 8.4 mg/dL — ABNORMAL LOW (ref 8.9–10.3)
Chloride: 107 mmol/L (ref 98–111)
Creatinine, Ser: 1.62 mg/dL — ABNORMAL HIGH (ref 0.44–1.00)
GFR, Estimated: 30 mL/min — ABNORMAL LOW (ref >60)
Glucose, Bld: 111 mg/dL — ABNORMAL HIGH (ref 70–99)
Potassium: 4.6 mmol/L (ref 3.5–5.1)
Sodium: 139 mmol/L (ref 135–145)
Total Bilirubin: 0.5 mg/dL (ref 0.3–1.2)
Total Protein: 5.3 g/dL — ABNORMAL LOW (ref 6.5–8.1)

## 2023-07-29 LAB — CBC
HCT: 30.3 % — ABNORMAL LOW (ref 36.0–46.0)
Hemoglobin: 9.8 g/dL — ABNORMAL LOW (ref 12.0–15.0)
MCH: 31 pg (ref 26.0–34.0)
MCHC: 32.3 g/dL (ref 30.0–36.0)
MCV: 95.9 fL (ref 80.0–100.0)
Platelets: 129 10*3/uL — ABNORMAL LOW (ref 150–400)
RBC: 3.16 MIL/uL — ABNORMAL LOW (ref 3.87–5.11)
RDW: 13.2 % (ref 11.5–15.5)
WBC: 7.3 10*3/uL (ref 4.0–10.5)
nRBC: 0 % (ref 0.0–0.2)

## 2023-07-29 LAB — ECHOCARDIOGRAM COMPLETE
AR max vel: 1.84 cm2
AV Area VTI: 2.12 cm2
AV Area mean vel: 2.01 cm2
AV Mean grad: 3.5 mm[Hg]
AV Peak grad: 7 mm[Hg]
Ao pk vel: 1.32 m/s
Area-P 1/2: 2.92 cm2
Height: 62 in
MV VTI: 2.29 cm2
S' Lateral: 1.9 cm
Weight: 2398.6 [oz_av]

## 2023-07-29 LAB — HEPARIN LEVEL (UNFRACTIONATED)
Heparin Unfractionated: 0.93 [IU]/mL — ABNORMAL HIGH (ref 0.30–0.70)
Heparin Unfractionated: 0.26 [IU]/mL — ABNORMAL LOW (ref 0.30–0.70)

## 2023-07-29 MED ORDER — NITROGLYCERIN IN D5W 200-5 MCG/ML-% IV SOLN
0.0000 ug/min | INTRAVENOUS | Status: DC
  Administered 2023-07-29: 5 ug/min via INTRAVENOUS
  Administered 2023-07-30: 55 ug/min via INTRAVENOUS
  Filled 2023-07-29 (×2): qty 250

## 2023-07-29 MED ORDER — HEPARIN BOLUS VIA INFUSION
1000.0000 [IU] | Freq: Once | INTRAVENOUS | Status: AC
  Administered 2023-07-29: 1000 [IU] via INTRAVENOUS
  Filled 2023-07-29: qty 1000

## 2023-07-29 MED ORDER — TECHNETIUM TO 99M ALBUMIN AGGREGATED
4.3400 | Freq: Once | INTRAVENOUS | Status: AC | PRN
Start: 2023-07-29 — End: 2023-07-29
  Administered 2023-07-29: 4.34 via INTRAVENOUS

## 2023-07-29 MED ORDER — CHLORHEXIDINE GLUCONATE CLOTH 2 % EX PADS
6.0000 | MEDICATED_PAD | Freq: Every day | CUTANEOUS | Status: DC
  Administered 2023-07-29 – 2023-08-02 (×5): 6 via TOPICAL

## 2023-07-29 MED ORDER — SODIUM CHLORIDE 0.9 % IV SOLN
12.5000 mg | Freq: Once | INTRAVENOUS | Status: DC
  Filled 2023-07-29: qty 0.5

## 2023-07-29 NOTE — Progress Notes (Signed)
Rounding Note    Patient Name: Stephanie Charles Date of Encounter: 07/29/2023  [facility] Cardiologist: Julien Nordmann, MD   Subjective   Patient says chest pain is an 8/10. She is on 2L O2. VQ scan ordered. Plan for echo today.   Coreg held overnight for bradycardia.  Inpatient Medications    Scheduled Meds:  aspirin EC  81 mg Oral Daily   atorvastatin  40 mg Oral Q supper   Chlorhexidine Gluconate Cloth  6 each Topical Daily   clopidogrel  75 mg Oral Daily   influenza vaccine adjuvanted  0.5 mL Intramuscular Tomorrow-1000   levothyroxine  137 mcg Oral Q0600   pantoprazole  40 mg Oral Daily   sertraline  25 mg Oral Daily   sodium chloride flush  3 mL Intravenous Q12H   Continuous Infusions:  epinephrine     heparin 800 Units/hr (07/29/23 0701)   PRN Meds: acetaminophen **OR** acetaminophen, alum & mag hydroxide-simeth, hydrALAZINE, HYDROmorphone (DILAUDID) injection, melatonin, nitroGLYCERIN, ondansetron **OR** ondansetron (ZOFRAN) IV, polyethylene glycol   Vital Signs    Vitals:   07/29/23 0600 07/29/23 0630 07/29/23 0700 07/29/23 0800  BP: (!) 155/50  (!) 156/52 (!) 158/54  Pulse: (!) 57 (!) 57 (!) 56 63  Resp: 16 13 14 16   Temp:      TempSrc:      SpO2: 97% 97% 99% 95%  Weight:  68 kg    Height:        Intake/Output Summary (Last 24 hours) at 07/29/2023 1014 Last data filed at 07/29/2023 0701 Gross per 24 hour  Intake 459.93 ml  Output 1095 ml  Net -635.07 ml      07/29/2023    6:30 AM 07/28/2023    7:30 PM 07/28/2023   10:05 AM  Last 3 Weights  Weight (lbs) 149 lb 14.6 oz 149 lb 4 oz 140 lb  Weight (kg) 68 kg 67.7 kg 63.504 kg      Telemetry    Nsr PVCs HR 50-60s - Personally Reviewed  ECG    No new - Personally Reviewed  Physical Exam   GEN: No acute distress.   Neck: No JVD Cardiac: RRR, no murmurs, rubs, or gallops.  Respiratory: Clear to auscultation bilaterally. GI: Soft, nontender, non-distended  MS: No edema; No  deformity. Neuro:  Nonfocal  Psych: Normal affect   Labs    High Sensitivity Troponin:   Recent Labs  Lab 07/28/23 1009 07/28/23 1225 07/28/23 1627 07/28/23 1914  TROPONINIHS 57* 66* 84* 77*     Chemistry Recent Labs  Lab 07/28/23 1009 07/28/23 1116 07/28/23 1914 07/29/23 0531  NA 140  --  137 139  K 4.0  --  4.1 4.6  CL 109  --  108 107  CO2 22  --  22 25  GLUCOSE 120*  --  147* 111*  BUN 45*  --  43* 40*  CREATININE 1.91*  --  1.71* 1.62*  CALCIUM 8.5*  --  8.2* 8.4*  MG  --   --  2.0  --   PROT  --  6.1*  --  5.3*  ALBUMIN  --  3.5  --  3.3*  AST  --  21  --  15  ALT  --  15  --  13  ALKPHOS  --  75  --  61  BILITOT  --  0.4  --  0.5  GFRNONAA 25*  --  28* 30*  ANIONGAP 9  --  7 7    Lipids  Recent Labs  Lab 07/28/23 1628  CHOL 90  TRIG 49  HDL 29*  LDLCALC 51  CHOLHDL 3.1    Hematology Recent Labs  Lab 07/28/23 1009 07/29/23 0531  WBC 6.2 7.3  RBC 3.35* 3.16*  HGB 10.5* 9.8*  HCT 32.0* 30.3*  MCV 95.5 95.9  MCH 31.3 31.0  MCHC 32.8 32.3  RDW 13.2 13.2  PLT 134* 129*   Thyroid  Recent Labs  Lab 07/28/23 1628  TSH 0.549    BNP Recent Labs  Lab 07/28/23 1009  BNP 407.5*    DDimer  Recent Labs  Lab 07/28/23 1116  DDIMER 1.73*     Radiology    US Venous Img Lower Bilateral  Result Date: 07/28/2023 CLINICAL DATA:  Bilateral lower extremity swelling for 2 days EXAM: Bilateral lower Extremity Venous Doppler Ultrasound TECHNIQUE: Gray-scale sonography with compression, as well as color and duplex ultrasound, were performed to evaluate the deep venous system(s) from the level of the common femoral vein through the popliteal and proximal calf veins. COMPARISON:  None available FINDINGS: VENOUS Normal compressibility of the common femoral, superficial femoral, and popliteal veins, as well as the visualized calf veins. Visualized portions of profunda femoral vein and great saphenous vein unremarkable. No filling defects to suggest DVT on  grayscale or color Doppler imaging. Doppler waveforms show normal direction of venous flow, normal respiratory plasticity and response to augmentation. OTHER None. Limitations: none IMPRESSION: No  lower extremity DVT. Electronically Signed   By: Acquanetta Belling M.D.   On: 07/28/2023 16:44   CT Chest Wo Contrast  Result Date: 07/28/2023 CLINICAL DATA:  Chest pain EXAM: CT CHEST WITHOUT CONTRAST TECHNIQUE: Multidetector CT imaging of the chest was performed following the standard protocol without IV contrast. RADIATION DOSE REDUCTION: This exam was performed according to the departmental dose-optimization program which includes automated exposure control, adjustment of the mA and/or kV according to patient size and/or use of iterative reconstruction technique. COMPARISON:  Chest CT dated Feb 21, 2013 FINDINGS: Cardiovascular: Marked cardiomegaly. Trace pericardial effusion. Normal caliber thoracic aorta with severe atherosclerotic disease. Dilated main pulmonary artery, measuring up to 3.2 cm. Severe coronary artery calcifications. Mediastinum/Nodes: Small hiatal hernia. No enlarged lymph nodes seen in the chest. Lungs/Pleura: Central airways are patent. Mild linear consolidation of the anterior right upper lobe, likely due to scarring or atelectasis. Trace bilateral pleural effusions. Mild bilateral lower lobe ground-glass opacities. Upper Abdomen: Right adrenal gland lesion measuring 2.0 cm, unchanged when compared with 2014 prior and consistent with benign adenoma, no specific follow-up imaging is necessary. No acute abnormality. Musculoskeletal: No chest wall mass or suspicious bone lesions identified. IMPRESSION: 1. Mild ground-glass opacities of the bilateral lower lobes, could be due to atelectasis, infection or aspiration. 2. Trace bilateral pleural effusions. 3. Dilated pulmonary artery, findings can be seen in the setting of pulmonary hypertension. 4. Cardiomegaly, aortic Atherosclerosis (ICD10-I70.0) and  Emphysema (ICD10-J43.9). Electronically Signed   By: Allegra Lai M.D.   On: 07/28/2023 15:47   DG Chest Portable 1 View  Result Date: 07/28/2023 CLINICAL DATA:  Chest pain radiating to the back EXAM: PORTABLE CHEST 1 VIEW COMPARISON:  Chest radiograph 07/10/2022 FINDINGS: The heart is enlarged, grossly unchanged. The upper mediastinal contours are stable. Lung volumes are low. There are increased interstitial markings bilaterally likely reflecting mild pulmonary interstitial edema. There are suspected trace bilateral pleural effusions with retrocardiac opacity likely reflecting atelectasis. There is no pneumothorax There is no acute osseous  abnormality. IMPRESSION: 1. Cardiomegaly with probable mild pulmonary interstitial edema. 2. Suspected trace bilateral pleural effusions with retrocardiac opacity likely reflecting atelectasis. Superimposed infection is not excluded. Electronically Signed   By: Lesia Hausen M.D.   On: 07/28/2023 11:51    Cardiac Studies   Carotid artery ultrasound 12/28/2022: Summary:  Right Carotid: Velocities in the right ICA are consistent with a 1-39%  stenosis.  Non-hemodynamically significant plaque <50% noted in the  CCA. The ECA appears >50% stenosed. Tortuous CCA.   Left Carotid: Velocities in the left ICA are consistent with a 1-39%  stenosis.  Non-hemodynamically significant plaque <50% noted in the  CCA. The ECA appears <50% stenosed.   Vertebrals:  Bilateral vertebral arteries demonstrate antegrade flow.  Subclavians: Bilateral subclavian artery flow was disturbed.  __________   Luci Bank patch 09/2022: Normal sinus rhythm Patient had a min HR of 41 bpm, max HR of 185 bpm, and avg HR of 70 bpm.    Bundle Branch Block/IVCD was present.  1 run of Ventricular Tachycardia occurred lasting 8 beats with a max rate of 185 bpm (avg 170  bpm).    2 Supraventricular Tachycardia runs occurred, the run with the fastest interval lasting 9 beats with a max rate of 133  bpm (avg 96 bpm); the run with the fastest interval was also the longest.    Junctional Rhythm was present.    Isolated SVEs were occasional (1.9%, 23596), SVE Couplets were rare (<1.0%, 1783), and SVE Triplets were rare (<1.0%, 296).    Isolated VEs were occasional (3.4%, 42235), VE Couplets were rare (<1.0%, 1465), and VE Triplets were rare (<1.0%, 1). Ventricular Bigeminy and Trigeminy were present.   No patient triggered events recorded __________   2D echo 10/07/2021: 1. Frequent PVCs during study.   2. Difficult to determine LVEF due to very frequent ectopy and limited  echo windows. Based on images with no ectopy, left ventricular ejection  fraction, by estimation, is 40%. The left ventricle has  mildly-to-moderately decreased function. The left  ventricle demonstrates global hypokinesis. There is paradoxical septal  motion due to known LBBB. There is moderate concentric left ventricular  hypertrophy. Left ventricular diastolic parameters are indeterminate.   3. Right ventricular systolic function is normal. The right ventricular  size is normal.   4. Left atrial size was severely dilated.   5. The mitral valve is grossly normal. Trivial mitral valve  regurgitation.   6. The aortic valve is tricuspid. There is mild calcification of the  aortic valve. There is mild thickening of the aortic valve. Aortic valve  regurgitation is not visualized. Aortic valve sclerosis/calcification is  present, without any evidence of  aortic stenosis.   7. The inferior vena cava is normal in size with greater than 50%  respiratory variability, suggesting right atrial pressure of 3 mmHg.  __________   2D echo 03/07/2021: 1. Left ventricular ejection fraction, by estimation, is 50 to 55%. The  left ventricle has low normal function. Left ventricular endocardial  border not optimally defined to evaluate regional wall motion. There is  moderate left ventricular hypertrophy.  Indeterminate diastolic  filling due to E-A fusion.   2. Right ventricular systolic function is normal. The right ventricular  size is normal. Tricuspid regurgitation signal is inadequate for assessing  PA pressure.   3. Left atrial size was moderately dilated.   4. The mitral valve is abnormal. No evidence of mitral valve  regurgitation. No evidence of mitral stenosis.   5. The aortic  valve has an indeterminant number of cusps. There is mild  thickening of the aortic valve. Aortic valve regurgitation is not  visualized. Mild aortic valve sclerosis is present, with no evidence of  aortic valve stenosis. __________   Carotid artery ultrasound 07/2020: Summary:  Right Carotid: Velocities in the right ICA are consistent with a 1-39%  stenosis.  Non-hemodynamically significant plaque <50% noted in the  CCA.  The ECA appears >50% stenosed.   Left Carotid: Velocities in the left ICA are consistent with a 1-39%  stenosis.  Non-hemodynamically significant plaque <50% noted in the  CCA.  The ECA appears <50% stenosed.   Vertebrals:  Bilateral vertebral arteries demonstrate antegrade flow.  Subclavians: Left subclavian artery was stenotic. Normal flow hemodynamics  were seen in the right subclavian artery.  __________   2D echo 09/2019: 1. Left ventricular ejection fraction, by visual estimation, is 40 to  45%. The left ventricle has moderately decreased function. There is  moderately increased left ventricular hypertrophy.   2. Abnormal septal motion consistent with left bundle branch block.   3. Left ventricular diastolic parameters are consistent with Grade I  diastolic dysfunction (impaired relaxation).   4. The left ventricle demonstrates global hypokinesis.   5. Global right ventricle has moderately reduced systolic function.The  right ventricular size is normal. Mildly increased right ventricular wall  thickness.   6. Left atrial size was moderately dilated.   7. Right atrial size was not well  visualized.   8. Presence of pericardial fat pad.   9. The mitral valve was not well visualized. Trivial mitral valve  regurgitation. No evidence of mitral stenosis.  10. The tricuspid valve is not well visualized. Tricuspid valve  regurgitation is not demonstrated.  11. The aortic valve is tricuspid. Aortic valve regurgitation is not  visualized. No evidence of aortic valve sclerosis or stenosis.  12. Pulmonic regurgitation is mild.  13. The pulmonic valve was not well visualized. Pulmonic valve  regurgitation is mild.  14. TR signal is inadequate for assessing pulmonary artery systolic  pressure.  15. The inferior vena cava is dilated in size with >50% respiratory  variability, suggesting right atrial pressure of 8 mmHg.  16. The interatrial septum was not well visualized. __________   [facility] with PCI in 06/07/2019: Mid LAD lesion is 95% stenosed. Post intervention, there is a 0% residual stenosis. A drug-eluting stent was successfully placed using a STENT RESOLUTE ONYX 2.5X22. 2nd Diag lesion is 30% stenosed.   Successful angioplasty and drug-eluting stent placement to the mid LAD.   Recommendations: Dual antiplatelet therapy for at least 1 year. Blood pressure control and cardiac rehab.  Continue treatment of risk factors.       Coronary angiography:  Coronary dominance: Right  Left mainstem:  Large vessel that bifurcates into the LAD and left circumflex, no significant disease noted  Left anterior descending (LAD):   Large vessel that extends to the apical region, diagonal branch 2 of moderate size, proximal to mid LAD prior to large diagonal with 90% stenosis, hazy, heavily calcified.  Left circumflex (LCx):  Large vessel with OM branch 2, no significant disease noted, mild to moderate proximal to mid disease estimated 40%  Right coronary artery (RCA):  Right dominant vessel with PL and PDA, no significant disease noted, very mild proximal disease estimated 30%  Left  ventriculography: Aortic valve crossed for pressures, no significant there is no significant mitral regurgitation , no significant aortic valve stenosis Frequent ectopy noted  RA pressures  mean of 8 RV pressure 32/7 mean 9 PA pressure 31/16 mean 21 Wedge pressure 10 Cardiac output 3.57 Cardiac index 2.02  Final Conclusions:   Culprit vessel for presentation is the proximal to mid LAD estimated at 90% Case discussed with Dr. Kirke Corin, he will attempt PCI   Recommendations:  pci as above Cardiomyopathy meds, will make changes __________   2D echo 06/2019: 1. The left ventricle has moderately reduced systolic function, with an  ejection fraction of 35-40%. The cavity size was normal. There is  moderately increased left ventricular wall thickness. Left ventricular  diastolic Doppler parameters are  indeterminate. Left ventricular diffuse hypokinesis.   2. The right ventricle has normal systolic function. The cavity was  normal. There is no increase in right ventricular wall thickness.Unable to  estimate RVSP   3. Frequent PVCs.   4. Left atrial size was moderately dilated.  Patient Profile     89 y.o. female with a hx of CAD with prior NSTEMI in 2016 and NSTEMI in 06/2019 s/p PCI/DES to the LAD, HFrEF 2/2 ICM, labile HTN, syncope felt to be vasovagal, TIA/posterior CVA, LBBB, carotid artery disease, hypothyroidism, diverticulitis, C. Dif, CKD stage 3, anemia, gout, anxiety, depression who is being seen 07/28/2023 for the evaluation of chest pain.  Assessment & Plan    NSTEMI CAD with prior PCI - patient presented with chest pain that awoke her from sleep with associated nausea found to have elevated troponin and elevated BP started on IV heparin -High-sensitivity troponin 57 >>84>77  - Ddimer was elevated>>VQ scan pending - continue IV heparin -Patient reports persistent 8/10 chest pain, although appears fairly comfortable -echo ordered -Continue aspirin 81 mg daily, Lipitor 40  mg daily, Plavix 75 mg daily - PTA Imdur held on admission - BB held for bradycardia - would like to avoid heart cath given age, CKD and other comorbidities. Plan for IV nitroglycerin for pain relief.    HFrEF/ICM - Echo with EF as low as 35-40% in 2020 - most recent echo 2023 showed LVEF 40%, moderate LVH, normal RVSF -PTA meds held including Entresto 97-103 mg twice daily, torsemide 20 mg daily, Hydralazine 50mg  TID - re-check echo as above - she appears euvolemic on exam. Unsure she will tolerate all her PTA meds given kidney function   HTN - BP elevated on Admission, still mildly elevated - PTA PTA Entresto 97-103 mg twice daily, hydralazine 50 mg 3 times daily, Imdur 30 mg daily - IV nitroglycerin for pain relief - would consider restarting Hydral  HLD - LDL 47 - continue Lipitor 40mg  daily  CKD stage 3-4 - scr 1.91>1.71>1.62 - follows with nephrology as OP   For questions or updates, please contact [facility] Please consult [email or web address] for contact info under        Signed, Miche Loughridge David Stall, PA-C  07/29/2023, 10:14 AM

## 2023-07-29 NOTE — Consult Note (Signed)
PHARMACY - ANTICOAGULATION CONSULT NOTE  Pharmacy Consult for IV Heparin Indication: chest pain/ACS  Patient Measurements: Height: 5\' 2"  (157.5 cm) Weight: 68 kg (149 lb 14.6 oz) IBW/kg (Calculated) : 50.1 Heparin Dosing Weight: 62.9 kg  Labs: Recent Labs    07/28/23 1009 07/28/23 1009 07/28/23 1116 07/28/23 1225 07/28/23 1627 07/28/23 1911 07/28/23 1914 07/29/23 0531 07/29/23 1617  HGB 10.5*  --   --   --   --   --   --  9.8*  --   HCT 32.0*  --   --   --   --   --   --  30.3*  --   PLT 134*  --   --   --   --   --   --  129*  --   APTT  --   --  32  --   --   --   --   --   --   LABPROT  --   --  19.3*  --   --   --   --   --   --   INR  --   --  1.6*  --   --   --   --   --   --   HEPARINUNFRC  --    < > <0.10*  --   --  0.18*  --  0.93* 0.26*  CREATININE 1.91*  --   --   --   --   --  1.71* 1.62*  --   TROPONINIHS 57*  --   --  66* 84*  --  77*  --   --    < > = values in this interval not displayed.   Estimated Creatinine Clearance: 20.9 mL/min (A) (by C-G formula based on SCr of 1.62 mg/dL (H)).  Medical History: Past Medical History:  Diagnosis Date   Anemia    C. difficile diarrhea    CHF (congestive heart failure) (HCC) 10/05/2021   Diverticulitis    GERD (gastroesophageal reflux disease)    Hyperlipidemia    Hypertension    Hypothyroidism    Myocardial infarction (HCC) 08/2017   TIA (transient ischemic attack)    1 approx 2015, 1 approx 2018   Vertigo    last episode several months ago   [name]    Medications:  Medication reconciliation is pending. No apparent anticoagulation prior to admission  Assessment: 89 y/o F with medical history as above presenting to the ED with chest pain radiating to the back. Pharmacy consulted to initiate and manage heparin infusion for suspected ACS.  10/24 1911 HL 0.18  10/25 0531 HL 0.93, SUPRAtherapeutic 10/25 1617 HL 0.26, SUBtherapeutic  Goal of Therapy:  Heparin level 0.3-0.7  units/ml Monitor platelets by anticoagulation protocol: Yes   Plan:  --Heparin level is subtherapeutic at 800 units/hr. Previously supratherapeutic at 900 units/hr --Give heparin 1000 unit IV bolus and increase heparin infusion rate back to 900 units/hr --Re-check heparin level in 8 hours. If elevated again, given prior levels, consider reducing to 850 units/hr --Daily CBC per protocol while on IV heparin  Tressie Ellis 07/29/2023,4:56 PM

## 2023-07-29 NOTE — Progress Notes (Signed)
Progress Note   Patient: Stephanie Charles VZD:[id number] DOB: [birth year] DOA: 07/28/2023     1 DOS: the patient was seen and examined on 07/29/2023   Brief hospital course: From HPI "Stephanie Charles is a 89 y.o. female with medical history significant of Coronary artery disease s/p PCI with DES to proximal/mid LAD, HFrEF with last EF of 35-40% 2/2 ischemic cardiomyopathy, CKD stage IIIb, hyperlipidemia, hypertension, hypothyroidism, TIA, anemia, who presents to the ED due to chest pain.   Stephanie Charles states that she was in her usual state of health yesterday when she awoke today with sudden onset chest pain that was central and radiating straight back to her back.  At the time, she stated she was also experiencing nausea with shortness of breath and felt as though she was going to pass out.  She denies any similar episodes in the past stating her prior heart attacks were much less painful.  At this time, she denies any lower extremity swelling, palpitations.  She notes that after receiving nitroglycerin, her chest pain severity has reduced somewhat but is still present.   Stephanie Charles states she has been very stressed lately as her daughter was recently hospitalized and has required frequent follow-up visits at [facility], and she has been going with her to these appointments   ED course: On arrival to the ED, patient was hypertensive at 198/82 with heart rate of 74.  She is saturating at 97% on room air she was afebrile at 97.7.  Initial workup notable for hemoglobin of 10.5, platelets of 134, glucose 120, BUN 45, creatinine 1.91 with GFR of 25.  BNP elevated at 407, troponin 57 and then 66.  D-dimer elevated at 1.73.  INR 1.6.  Bilateral lower extremity duplex studies ordered and CT chest.  Cardiology consulted; recommending echocardiogram and heparin infusion.  TRH contacted for admission.  "    Assessment and Plan:  NSTEMI (non-ST elevated myocardial infarction) [facility]) Patient is presenting with sudden  chest pain that woke her out of her sleep with a minimal elevation of her troponin compared to baseline.  EKG with known left bundle branch block.  D-dimer elevated.  Differential at this point includes primary ischemic event versus PE.  Lower extremity Doppler is negative.   - Cardiology on board we appreciate input, case discussed - Telemetry monitoring - Continue heparin infusion per pharmacy dosing - Nitroglycerin as needed for chest pain - VQ scan did not show any evidence of PE - Follow-up echocardiogram - Continue to trend troponin for now   HFrEF (heart failure with reduced ejection fraction) (HCC) Patient has a history of HFrEF with last EF of 40% with global hypokinesis in the setting of ischemic cardiomyopathy.She does not appear markedly hypervolemic at this time with no evidence of peripheral edema or JVD.  - Continue telemetry monitoring - Follow-up on echocardiogram - Daily weights   CAD (coronary artery disease) Patient has a history of CAD s/p PCI with DES to mid/proximal LAD complicated by ischemic cardiomyopathy. Cardiologist on board and case discussed - Continue DAPT and statin - As noted above heparin per pharmacy dosing   Chronic kidney disease, stage 4 (severe) (HCC) In 2024, patient's creatinine has ranged between 1.48 and 2.29, concerning with progression of her CKD stage IIIb to CKD stage IV.  Creatinine currently 1.91.   - Avoid nephrotoxic agents as able - Daily BMP - Strict in and out   Essential hypertension Continue home Entresto, hydralazine, Coreg - Imdur started by cardiology -  Hydralazine 5 mg every 8 hours as needed for SBP over 180   Bilateral leg paresthesia Per chart review, this is a chronic issue for Stephanie Charles.  DP pulses are intact although reduced on the left.   - Vitamin B12 - Consider ABIs   Adult hypothyroidism - Continue home Synthroid - TSH pending   CODE STATUS DNI DNR   Consults: Cardiology   Family Communication: No  family at bedside     Subjective:  Patient seen and examined at bedside this morning Admits to improvement, denied chest pain nausea vomiting abdominal pain  Physical Exam: General: Laying in bed in no acute distress.  Cardiovascular:     Rate and Rhythm: Normal rate and regular rhythm.     Heart sounds: No murmur heard. Pulmonary:     Effort: Pulmonary effort is normal. No respiratory distress.  Abdominal:     General: Bowel sounds are normal. There is no distension.     Palpations: Abdomen is soft.     Tenderness: There is no abdominal tenderness. There is no guarding.  Musculoskeletal:        General: No deformity.     Right lower leg: No edema.     Left lower leg: No edema.  Skin:    General: Skin is warm and dry.  Neurological:     General: No focal deficit present.  Psychiatric:        Mood and Affect: Mood normal.        Behavior: Behavior normal.   Vitals:   07/29/23 1000 07/29/23 1200 07/29/23 1215 07/29/23 1220  BP: (!) 160/47 (!) 211/71 (!) 187/69   Pulse: (!) 59 77 69 74  Resp: 15 (!) 27 19 (!) 30  Temp:      TempSrc:      SpO2: 98% 97% 95% 93%  Weight:      Height:          Latest Ref Rng & Units 07/29/2023    5:31 AM 07/28/2023    7:14 PM 07/28/2023   10:09 AM  BMP  Glucose 70 - 99 mg/dL 782  956  213   BUN 8 - 23 mg/dL 40  43  45   Creatinine 0.44 - 1.00 mg/dL 0.86  5.78  4.69   Sodium 135 - 145 mmol/L 139  137  140   Potassium 3.5 - 5.1 mmol/L 4.6  4.1  4.0   Chloride 98 - 111 mmol/L 107  108  109   CO2 22 - 32 mmol/L 25  22  22    Calcium 8.9 - 10.3 mg/dL 8.4  8.2  8.5        Latest Ref Rng & Units 07/29/2023    5:31 AM 07/28/2023   10:09 AM 02/23/2022    2:29 PM  CBC  WBC 4.0 - 10.5 K/uL 7.3  6.2  9.7   Hemoglobin 12.0 - 15.0 g/dL 9.8  62.9  52.8   Hematocrit 36.0 - 46.0 % 30.3  32.0  31.9   Platelets 150 - 400 K/uL 129  134  166     Data Reviewed: Reviewed patient's lab report as shown,,Blood pressure (!) 187/69, pulse 74,  temperature 98.6 F (37 C), temperature source Axillary, resp. rate (!) 30, height 5\' 2"  (1.575 m), weight 68 kg, SpO2 93%. VQ scan, nursing documentation as well as cardiology documentation    Time spent: 56 minutes  Author: Loyce Dys, MD 07/29/2023 3:19 PM  For on call review [email or web address].

## 2023-07-29 NOTE — Plan of Care (Signed)
Continuing with plan of care. 

## 2023-07-29 NOTE — Consult Note (Signed)
PHARMACY - ANTICOAGULATION CONSULT NOTE  Pharmacy Consult for IV Heparin Indication: chest pain/ACS  Patient Measurements: Height: 5\' 2"  (157.5 cm) Weight: 68 kg (149 lb 14.6 oz) IBW/kg (Calculated) : 50.1 Heparin Dosing Weight: 62.9 kg  Labs: Recent Labs    07/28/23 1009 07/28/23 1116 07/28/23 1225 07/28/23 1627 07/28/23 1911 07/28/23 1914 07/29/23 0531  HGB 10.5*  --   --   --   --   --  9.8*  HCT 32.0*  --   --   --   --   --  30.3*  PLT 134*  --   --   --   --   --  129*  APTT  --  32  --   --   --   --   --   LABPROT  --  19.3*  --   --   --   --   --   INR  --  1.6*  --   --   --   --   --   HEPARINUNFRC  --  <0.10*  --   --  0.18*  --  0.93*  CREATININE 1.91*  --   --   --   --  1.71*  --   TROPONINIHS 57*  --  66* 84*  --  77*  --    Estimated Creatinine Clearance: 19.8 mL/min (A) (by C-G formula based on SCr of 1.71 mg/dL (H)).  Medical History: Past Medical History:  Diagnosis Date   Anemia    C. difficile diarrhea    CHF (congestive heart failure) (HCC) 10/05/2021   Diverticulitis    GERD (gastroesophageal reflux disease)    Hyperlipidemia    Hypertension    Hypothyroidism    Myocardial infarction (HCC) 08/2017   TIA (transient ischemic attack)    1 approx 2015, 1 approx 2018   Vertigo    last episode several months ago   [name]    Medications:  Medication reconciliation is pending. No apparent anticoagulation prior to admission  Assessment: 89 y/o F with medical history as above presenting to the ED with chest pain radiating to the back. Pharmacy consulted to initiate and manage heparin infusion for suspected ACS.  10/24 1911 HL 0.18  10/25 0531 HL 0.93, SUPRAtherapeutic   Goal of Therapy:  Heparin level 0.3-0.7 units/ml Monitor platelets by anticoagulation protocol: Yes   Plan:  10/25:  HL @ 0531 = 0.93, SUPRAtherapeutic  - RN confirms that sample was drawn from opposite arm as your heparin infusion so will take  this as valid - Will decrease heparin drip rate to 800 units/hr and recheck HL 8 hrs after rate change  Recheck heparin level in 8 hours. CBC daily while on heparin.   Ricco Dershem D, PharmD 07/29/2023,6:40 AM

## 2023-07-30 DIAGNOSIS — N184 Chronic kidney disease, stage 4 (severe): Secondary | ICD-10-CM | POA: Diagnosis not present

## 2023-07-30 DIAGNOSIS — R410 Disorientation, unspecified: Secondary | ICD-10-CM

## 2023-07-30 DIAGNOSIS — I255 Ischemic cardiomyopathy: Secondary | ICD-10-CM | POA: Diagnosis not present

## 2023-07-30 DIAGNOSIS — I2489 Other forms of acute ischemic heart disease: Secondary | ICD-10-CM

## 2023-07-30 DIAGNOSIS — R0789 Other chest pain: Secondary | ICD-10-CM | POA: Diagnosis not present

## 2023-07-30 DIAGNOSIS — I10 Essential (primary) hypertension: Secondary | ICD-10-CM | POA: Diagnosis not present

## 2023-07-30 DIAGNOSIS — I214 Non-ST elevation (NSTEMI) myocardial infarction: Secondary | ICD-10-CM | POA: Diagnosis not present

## 2023-07-30 LAB — CBC
HCT: 27.4 % — ABNORMAL LOW (ref 36.0–46.0)
Hemoglobin: 9.3 g/dL — ABNORMAL LOW (ref 12.0–15.0)
MCH: 31.6 pg (ref 26.0–34.0)
MCHC: 33.9 g/dL (ref 30.0–36.0)
MCV: 93.2 fL (ref 80.0–100.0)
Platelets: 120 10*3/uL — ABNORMAL LOW (ref 150–400)
RBC: 2.94 MIL/uL — ABNORMAL LOW (ref 3.87–5.11)
RDW: 13.4 % (ref 11.5–15.5)
WBC: 8 10*3/uL (ref 4.0–10.5)
nRBC: 0 % (ref 0.0–0.2)

## 2023-07-30 LAB — HEPARIN LEVEL (UNFRACTIONATED)
Heparin Unfractionated: 0.3 [IU]/mL (ref 0.30–0.70)
Heparin Unfractionated: 0.31 [IU]/mL (ref 0.30–0.70)

## 2023-07-30 LAB — URINALYSIS, COMPLETE (UACMP) WITH MICROSCOPIC
Bilirubin Urine: NEGATIVE
Glucose, UA: NEGATIVE mg/dL
Ketones, ur: NEGATIVE mg/dL
Nitrite: NEGATIVE
Protein, ur: NEGATIVE mg/dL
Specific Gravity, Urine: 1.012 (ref 1.005–1.030)
Squamous Epithelial / HPF: 0 /[HPF] (ref 0–5)
pH: 5 (ref 5.0–8.0)

## 2023-07-30 MED ORDER — ZIPRASIDONE HCL 20 MG PO CAPS: 20.0000 mg | ORAL_CAPSULE | Freq: Two times a day (BID) | ORAL | Status: DC

## 2023-07-30 MED ORDER — AMLODIPINE BESYLATE 5 MG PO TABS
5.0000 mg | ORAL_TABLET | Freq: Two times a day (BID) | ORAL | Status: DC
  Administered 2023-07-30 – 2023-08-02 (×7): 5 mg via ORAL
  Filled 2023-07-30 (×7): qty 1

## 2023-07-30 MED ORDER — ISOSORBIDE DINITRATE 30 MG PO TABS
30.0000 mg | ORAL_TABLET | Freq: Two times a day (BID) | ORAL | Status: DC
  Administered 2023-07-30 – 2023-07-31 (×3): 30 mg via ORAL
  Filled 2023-07-30 (×3): qty 1

## 2023-07-30 MED ORDER — ZIPRASIDONE HCL 20 MG PO CAPS
20.0000 mg | ORAL_CAPSULE | Freq: Two times a day (BID) | ORAL | Status: DC
  Administered 2023-07-30 – 2023-08-01 (×4): 20 mg via ORAL
  Filled 2023-07-30 (×5): qty 1

## 2023-07-30 MED ORDER — ORAL CARE MOUTH RINSE: 15.0000 mL | OROMUCOSAL | Status: DC | PRN

## 2023-07-30 MED ORDER — HYDRALAZINE HCL 50 MG PO TABS
50.0000 mg | ORAL_TABLET | Freq: Three times a day (TID) | ORAL | Status: DC
  Administered 2023-07-30 – 2023-08-02 (×10): 50 mg via ORAL
  Filled 2023-07-30 (×10): qty 1

## 2023-07-30 NOTE — Progress Notes (Signed)
Progress Note   Patient: Stephanie Charles [id number]:[id number] DOB: [birth year] DOA: 07/28/2023     2 DOS: the patient was seen and examined on 07/30/2023  Subjective:  Patient seen and examined at bedside this morning Admits to improvement, denied chest pain nausea vomiting abdominal pain She was found to have elevated blood pressure as well as some confusion this morning Had been initiated on nitro drip Brief hospital course: From HPI "Stephanie Charles is a 89 y.o. female with medical history significant of Coronary artery disease s/p PCI with DES to proximal/mid LAD, HFrEF with last EF of 35-40% 2/2 ischemic cardiomyopathy, CKD stage IIIb, hyperlipidemia, hypertension, hypothyroidism, TIA, anemia, who presents to the ED due to chest pain.   Stephanie Charles states that she was in her usual state of health yesterday when she awoke today with sudden onset chest pain that was central and radiating straight back to her back.  At the time, she stated she was also experiencing nausea with shortness of breath and felt as though she was going to pass out.  She denies any similar episodes in the past stating her prior heart attacks were much less painful.  At this time, she denies any lower extremity swelling, palpitations.  She notes that after receiving nitroglycerin, her chest pain severity has reduced somewhat but is still present.   Stephanie Charles states she has been very stressed lately as her daughter was recently hospitalized and has required frequent follow-up visits at [facility], and she has been going with her to these appointments   ED course: On arrival to the ED, patient was hypertensive at 198/82 with heart rate of 74.  She is saturating at 97% on room air she was afebrile at 97.7.  Initial workup notable for hemoglobin of 10.5, platelets of 134, glucose 120, BUN 45, creatinine 1.91 with GFR of 25.  BNP elevated at 407, troponin 57 and then 66.  D-dimer elevated at 1.73.  INR 1.6.  Bilateral lower extremity duplex  studies ordered and CT chest.  Cardiology consulted; recommending echocardiogram and heparin infusion.  TRH contacted for admission.  "     Assessment and Plan:  NSTEMI (non-ST elevated myocardial infarction) [facility]) Patient is presenting with sudden chest pain that woke her out of her sleep with a minimal elevation of her troponin compared to baseline.  EKG with known left bundle branch block.  D-dimer elevated.  Differential at this point includes primary ischemic event versus PE.  Lower extremity Doppler is negative.   - Cardiology on board we appreciate input, case discussed - Telemetry monitoring - Continue heparin infusion per pharmacy dosing - Nitroglycerin as needed for chest pain - VQ scan did not show any evidence of PE -Echo shows EF 55 to 60% with indeterminate diastolic parameters -Audiologist on board and case discussed   HFrEF (heart failure with reduced ejection fraction) (HCC) Patient has a history of HFrEF with last EF of 40% with global hypokinesis in the setting of ischemic cardiomyopathy.She does not appear markedly hypervolemic at this time with no evidence of peripheral edema or JVD.  -Continue telemetry monitoring - Follow-up on echocardiogram - Daily weights   CAD (coronary artery disease) Patient has a history of CAD s/p PCI with DES to mid/proximal LAD complicated by ischemic cardiomyopathy. Cardiologist on board and case discussed - Continue DAPT and statin - As noted above heparin per pharmacy dosing   Chronic kidney disease, stage 4 (severe) (HCC) In 2024, patient's creatinine has ranged between 1.48 and 2.29,  concerning with progression of her CKD stage IIIb to CKD stage IV.  Creatinine currently 1.91.   - Avoid nephrotoxic agents as able - Daily BMP - Strict in and out   Essential hypertension Continue home Entresto, hydralazine, Coreg -Continue Imdur Titrate off nitro drip as blood pressure allows - Hydralazine 5 mg every 8 hours as needed for SBP  over 180   Bilateral leg paresthesia Per chart review, this is a chronic issue for Stephanie Charles.  DP pulses are intact although reduced on the left.   - Vitamin B12 - Consider ABIs   Adult hypothyroidism - Continue home Synthroid - TSH pending   CODE STATUS DNI DNR     Consults: Cardiology   Family Communication: No family at bedside      Physical Exam: General: Laying in bed in no acute distress.  Cardiovascular:     Rate and Rhythm: Normal rate and regular rhythm.     Heart sounds: No murmur heard. Pulmonary:     Effort: Pulmonary effort is normal. No respiratory distress.  Abdominal:     General: Bowel sounds are normal. There is no distension.     Palpations: Abdomen is soft.     Tenderness: There is no abdominal tenderness. There is no guarding.  Musculoskeletal:        General: No deformity.     Right lower leg: No edema.     Left lower leg: No edema.  Skin:    General: Skin is warm and dry.  Neurological:     General: No focal deficit present.  Psychiatric:        Mood and Affect: Mood normal.        Behavior: Behavior normal.     Data Reviewed: I have reviewed patient's vitals as well as cardiologist documentation and nursing documentation as well as labs below  Time spent: 46 minutes     Latest Ref Rng & Units 07/30/2023    1:13 AM 07/29/2023    5:31 AM 07/28/2023   10:09 AM  CBC  WBC 4.0 - 10.5 K/uL 8.0  7.3  6.2   Hemoglobin 12.0 - 15.0 g/dL 9.3  9.8  16.1   Hematocrit 36.0 - 46.0 % 27.4  30.3  32.0   Platelets 150 - 400 K/uL 120  129  134        Latest Ref Rng & Units 07/29/2023    5:31 AM 07/28/2023    7:14 PM 07/28/2023   10:09 AM  BMP  Glucose 70 - 99 mg/dL 096  045  409   BUN 8 - 23 mg/dL 40  43  45   Creatinine 0.44 - 1.00 mg/dL 8.11  9.14  7.82   Sodium 135 - 145 mmol/L 139  137  140   Potassium 3.5 - 5.1 mmol/L 4.6  4.1  4.0   Chloride 98 - 111 mmol/L 107  108  109   CO2 22 - 32 mmol/L 25  22  22    Calcium 8.9 - 10.3 mg/dL 8.4   8.2  8.5     Vitals:   07/30/23 1300 07/30/23 1330 07/30/23 1350 07/30/23 1400  BP: 129/70 (!) 139/57 (!) 134/58 (!) 139/51  Pulse: 67 62 86 82  Resp: 16 (!) 22 (!) 21 (!) 24  Temp:      TempSrc:      SpO2: 94% 99% 98% 95%  Weight:      Height:          Author:  Loyce Dys, MD 07/30/2023 2:14 PM  For on call review [email or web address].

## 2023-07-30 NOTE — Consult Note (Signed)
PHARMACY - ANTICOAGULATION CONSULT NOTE  Pharmacy Consult for IV Heparin Indication: chest pain/ACS  Patient Measurements: Height: 5\' 2"  (157.5 cm) Weight: 68 kg (149 lb 14.6 oz) IBW/kg (Calculated) : 50.1 Heparin Dosing Weight: 62.9 kg  Labs: Recent Labs    07/28/23 1009 07/28/23 1116 07/28/23 1225 07/28/23 1627 07/28/23 1911 07/28/23 1914 07/29/23 0531 07/29/23 1617 07/30/23 0113  HGB 10.5*  --   --   --   --   --  9.8*  --  9.3*  HCT 32.0*  --   --   --   --   --  30.3*  --  27.4*  PLT 134*  --   --   --   --   --  129*  --  120*  APTT  --  32  --   --   --   --   --   --   --   LABPROT  --  19.3*  --   --   --   --   --   --   --   INR  --  1.6*  --   --   --   --   --   --   --   HEPARINUNFRC  --  <0.10*  --   --    < >  --  0.93* 0.26* 0.30  CREATININE 1.91*  --   --   --   --  1.71* 1.62*  --   --   TROPONINIHS 57*  --  66* 84*  --  77*  --   --   --    < > = values in this interval not displayed.   Estimated Creatinine Clearance: 20.9 mL/min (A) (by C-G formula based on SCr of 1.62 mg/dL (H)).  Medical History: Past Medical History:  Diagnosis Date   Anemia    C. difficile diarrhea    CHF (congestive heart failure) (HCC) 10/05/2021   Diverticulitis    GERD (gastroesophageal reflux disease)    Hyperlipidemia    Hypertension    Hypothyroidism    Myocardial infarction (HCC) 08/2017   TIA (transient ischemic attack)    1 approx 2015, 1 approx 2018   Vertigo    last episode several months ago   [name]    Medications:  Medication reconciliation is pending. No apparent anticoagulation prior to admission  Assessment: 89 y/o F with medical history as above presenting to the ED with chest pain radiating to the back. Pharmacy consulted to initiate and manage heparin infusion for suspected ACS.  10/24 1911 HL 0.18  10/25 0531 HL 0.93, SUPRAtherapeutic 10/25 1617 HL 0.26, SUBtherapeutic 10/26 0113 HL 0.30, therapeutic X 1   Goal of  Therapy:  Heparin level 0.3-0.7 units/ml Monitor platelets by anticoagulation protocol: Yes   Plan:  10/26:  HL @ 0113 = 0.30, therapeutic X 1 - Will continue pt on current rate and recheck HL in 8 hrs on 10/26 @ 0900. --Daily CBC per protocol while on IV heparin  Jaymir Struble D 07/30/2023,2:40 AM

## 2023-07-30 NOTE — Progress Notes (Incomplete)
Rounding Note    Patient Name: Stephanie Charles Date of Encounter: 07/30/2023  [facility] Cardiologist: Julien Nordmann, MD   Subjective   Bps elevated this AM. She completed IV heparin x 48 hours.   Mental status is much better today, back to baseline. She denies chest pain or SOB.   Inpatient Medications    Scheduled Meds:  aspirin EC  81 mg Oral Daily   atorvastatin  40 mg Oral Q supper   Chlorhexidine Gluconate Cloth  6 each Topical Daily   clopidogrel  75 mg Oral Daily   influenza vaccine adjuvanted  0.5 mL Intramuscular Tomorrow-1000   levothyroxine  137 mcg Oral Q0600   pantoprazole  40 mg Oral Daily   sertraline  25 mg Oral Daily   sodium chloride flush  3 mL Intravenous Q12H   Continuous Infusions:  epinephrine     heparin 900 Units/hr (07/30/23 0245)   nitroGLYCERIN 55 mcg/min (07/30/23 0245)   promethazine (PHENERGAN) 12.5 mg in sodium chloride 0.9 % 1,000 mL infusion     PRN Meds: acetaminophen **OR** acetaminophen, alum & mag hydroxide-simeth, hydrALAZINE, HYDROmorphone (DILAUDID) injection, melatonin, nitroGLYCERIN, ondansetron **OR** ondansetron (ZOFRAN) IV, polyethylene glycol   Vital Signs    Vitals:   07/30/23 0500 07/30/23 0600 07/30/23 0700 07/30/23 0800  BP: (!) 172/62 (!) 170/113 (!) 128/93 (!) 188/74  Pulse: 72 87 86 99  Resp: 20 17 (!) 25 19  Temp:    98.5 F (36.9 C)  TempSrc:    Oral  SpO2: 94% 93% 92% 95%  Weight:      Height:        Intake/Output Summary (Last 24 hours) at 07/30/2023 0904 Last data filed at 07/30/2023 0400 Gross per 24 hour  Intake 385.89 ml  Output 575 ml  Net -189.11 ml      07/30/2023    1:54 AM 07/29/2023    6:30 AM 07/28/2023    7:30 PM  Last 3 Weights  Weight (lbs) 149 lb 14.6 oz 149 lb 14.6 oz 149 lb 4 oz  Weight (kg) 68 kg 68 kg 67.7 kg      Telemetry    NSR HR 70s- Personally Reviewed  ECG    No new - Personally Reviewed  Physical Exam   GEN: No acute distress.   Neck: No  JVD Cardiac: RRR, no murmurs, rubs, or gallops.  Respiratory: Clear to auscultation bilaterally. GI: Soft, nontender, non-distended  MS: No edema; No deformity. Neuro:  Nonfocal  Psych: Normal affect   Labs    High Sensitivity Troponin:   Recent Labs  Lab 07/28/23 1009 07/28/23 1225 07/28/23 1627 07/28/23 1914  TROPONINIHS 57* 66* 84* 77*     Chemistry Recent Labs  Lab 07/28/23 1009 07/28/23 1116 07/28/23 1914 07/29/23 0531  NA 140  --  137 139  K 4.0  --  4.1 4.6  CL 109  --  108 107  CO2 22  --  22 25  GLUCOSE 120*  --  147* 111*  BUN 45*  --  43* 40*  CREATININE 1.91*  --  1.71* 1.62*  CALCIUM 8.5*  --  8.2* 8.4*  MG  --   --  2.0  --   PROT  --  6.1*  --  5.3*  ALBUMIN  --  3.5  --  3.3*  AST  --  21  --  15  ALT  --  15  --  13  ALKPHOS  --  75  --  61  BILITOT  --  0.4  --  0.5  GFRNONAA 25*  --  28* 30*  ANIONGAP 9  --  7 7    Lipids  Recent Labs  Lab 07/28/23 1628  CHOL 90  TRIG 49  HDL 29*  LDLCALC 51  CHOLHDL 3.1    Hematology Recent Labs  Lab 07/28/23 1009 07/29/23 0531 07/30/23 0113  WBC 6.2 7.3 8.0  RBC 3.35* 3.16* 2.94*  HGB 10.5* 9.8* 9.3*  HCT 32.0* 30.3* 27.4*  MCV 95.5 95.9 93.2  MCH 31.3 31.0 31.6  MCHC 32.8 32.3 33.9  RDW 13.2 13.2 13.4  PLT 134* 129* 120*   Thyroid  Recent Labs  Lab 07/28/23 1628  TSH 0.549    BNP Recent Labs  Lab 07/28/23 1009  BNP 407.5*    DDimer  Recent Labs  Lab 07/28/23 1116  DDIMER 1.73*     Radiology    ECHOCARDIOGRAM COMPLETE  Result Date: 07/29/2023    ECHOCARDIOGRAM REPORT   Patient Name:   DANAPAOLA FURBEE Date of Exam: 07/29/2023 Medical Rec #:  [id number]      Height:       62.0 in Accession #:    [id number]     Weight:       149.9 lb Date of Birth:  [birth year]     BSA:          1.691 m Patient Age:    89 years       BP:           158/54 mmHg Patient Gender: F              HR:           63 bpm. Exam Location:  [facility] Procedure: 2D Echo, Color Doppler and Cardiac Doppler  Indications:     Chest pain R07.9  History:         Patient has prior history of Echocardiogram examinations, most                  recent 10/07/2000. CHF, Previous Myocardial Infarction; Risk                  Factors:Hypertension and Dyslipidemia.  Sonographer:     Cristela Blue Referring Phys:  [id number] Kijuana Ruppel H Zyere Jiminez Diagnosing Phys: Debbe Odea MD IMPRESSIONS  1. Left ventricular ejection fraction, by estimation, is 55 to 60%. The left ventricle has normal function. The left ventricle has no regional wall motion abnormalities. There is mild left ventricular hypertrophy. Left ventricular diastolic parameters are indeterminate.  2. Right ventricular systolic function is normal. The right ventricular size is normal.  3. Left atrial size was moderately dilated.  4. The mitral valve is normal in structure. Mild mitral valve regurgitation.  5. The aortic valve was not well visualized. Aortic valve regurgitation is trivial. FINDINGS  Left Ventricle: Left ventricular ejection fraction, by estimation, is 55 to 60%. The left ventricle has normal function. The left ventricle has no regional wall motion abnormalities. The left ventricular internal cavity size was normal in size. There is  mild left ventricular hypertrophy. Left ventricular diastolic parameters are indeterminate. Right Ventricle: The right ventricular size is normal. No increase in right ventricular wall thickness. Right ventricular systolic function is normal. Left Atrium: Left atrial size was moderately dilated. Right Atrium: Right atrial size was normal in size. Pericardium: There is no evidence of pericardial effusion. Mitral Valve: The mitral valve is normal in  structure. Mild mitral valve regurgitation. MV peak gradient, 3.7 mmHg. The mean mitral valve gradient is 1.0 mmHg. Tricuspid Valve: The tricuspid valve is normal in structure. Tricuspid valve regurgitation is mild. Aortic Valve: The aortic valve was not well visualized. Aortic valve  regurgitation is trivial. Aortic valve mean gradient measures 3.5 mmHg. Aortic valve peak gradient measures 7.0 mmHg. Aortic valve area, by VTI measures 2.12 cm. Pulmonic Valve: The pulmonic valve was not well visualized. Pulmonic valve regurgitation is not visualized. Aorta: The aortic root is normal in size and structure. Venous: The inferior vena cava was not well visualized. IAS/Shunts: No atrial level shunt detected by color flow Doppler.  LEFT VENTRICLE PLAX 2D LVIDd:         2.60 cm   Diastology LVIDs:         1.90 cm   LV e' medial:    4.90 cm/s LV PW:         1.40 cm   LV E/e' medial:  13.2 LV IVS:        1.60 cm   LV e' lateral:   9.14 cm/s LVOT diam:     2.00 cm   LV E/e' lateral: 7.1 LV SV:         57 LV SV Index:   34 LVOT Area:     3.14 cm  RIGHT VENTRICLE RV Basal diam:  3.70 cm RV Mid diam:    2.80 cm RV S prime:     13.50 cm/s TAPSE (M-mode): 2.3 cm LEFT ATRIUM           Index        RIGHT ATRIUM          Index LA diam:      4.70 cm 2.78 cm/m   RA Area:     6.79 cm LA Vol (A2C): 78.8 ml 46.59 ml/m  RA Volume:   11.90 ml 7.04 ml/m LA Vol (A4C): 68.2 ml 40.32 ml/m  AORTIC VALVE AV Area (Vmax):    1.84 cm AV Area (Vmean):   2.01 cm AV Area (VTI):     2.12 cm AV Vmax:           132.00 cm/s AV Vmean:          88.700 cm/s AV VTI:            0.268 m AV Peak Grad:      7.0 mmHg AV Mean Grad:      3.5 mmHg LVOT Vmax:         77.40 cm/s LVOT Vmean:        56.700 cm/s LVOT VTI:          0.181 m LVOT/AV VTI ratio: 0.67  AORTA Ao Root diam: 2.50 cm MITRAL VALVE               TRICUSPID VALVE MV Area (PHT): 2.92 cm    TR Peak grad:   20.2 mmHg MV Area VTI:   2.29 cm    TR Vmax:        225.00 cm/s MV Peak grad:  3.7 mmHg MV Mean grad:  1.0 mmHg    SHUNTS MV Vmax:       0.97 m/s    Systemic VTI:  0.18 m MV Vmean:      52.9 cm/s   Systemic Diam: 2.00 cm MV Decel Time: 260 msec MV E velocity: 64.50 cm/s MV A velocity: 69.70 cm/s MV E/A ratio:  0.93 Debbe Odea MD Electronically  signed by Debbe Odea MD Signature Date/Time: 07/29/2023/4:49:15 PM    Final    NM Pulmonary Perfusion  Result Date: 07/29/2023 CLINICAL DATA:  Chest pain and back pain for several days. EXAM: NUCLEAR MEDICINE PERFUSION LUNG SCAN TECHNIQUE: Perfusion images were obtained in multiple projections after intravenous injection of radiopharmaceutical. RADIOPHARMACEUTICALS:  4.3 mCi Tc-48m MAA COMPARISON:  Radiograph 07/28/2023 FINDINGS: No wedge-shaped peripheral perfusion defects to suggest acute pulmonary embolism. Low lung volumes. Cardiomegaly noted. IMPRESSION: No evidence acute pulmonary embolism. Electronically Signed   By: Genevive Bi M.D.   On: 07/29/2023 12:01   US Venous Img Lower Bilateral  Result Date: 07/28/2023 CLINICAL DATA:  Bilateral lower extremity swelling for 2 days EXAM: Bilateral lower Extremity Venous Doppler Ultrasound TECHNIQUE: Gray-scale sonography with compression, as well as color and duplex ultrasound, were performed to evaluate the deep venous system(s) from the level of the common femoral vein through the popliteal and proximal calf veins. COMPARISON:  None available FINDINGS: VENOUS Normal compressibility of the common femoral, superficial femoral, and popliteal veins, as well as the visualized calf veins. Visualized portions of profunda femoral vein and great saphenous vein unremarkable. No filling defects to suggest DVT on grayscale or color Doppler imaging. Doppler waveforms show normal direction of venous flow, normal respiratory plasticity and response to augmentation. OTHER None. Limitations: none IMPRESSION: No  lower extremity DVT. Electronically Signed   By: Acquanetta Belling M.D.   On: 07/28/2023 16:44   CT Chest Wo Contrast  Result Date: 07/28/2023 CLINICAL DATA:  Chest pain EXAM: CT CHEST WITHOUT CONTRAST TECHNIQUE: Multidetector CT imaging of the chest was performed following the standard protocol without IV contrast. RADIATION DOSE REDUCTION: This exam was performed  according to the departmental dose-optimization program which includes automated exposure control, adjustment of the mA and/or kV according to patient size and/or use of iterative reconstruction technique. COMPARISON:  Chest CT dated Feb 21, 2013 FINDINGS: Cardiovascular: Marked cardiomegaly. Trace pericardial effusion. Normal caliber thoracic aorta with severe atherosclerotic disease. Dilated main pulmonary artery, measuring up to 3.2 cm. Severe coronary artery calcifications. Mediastinum/Nodes: Small hiatal hernia. No enlarged lymph nodes seen in the chest. Lungs/Pleura: Central airways are patent. Mild linear consolidation of the anterior right upper lobe, likely due to scarring or atelectasis. Trace bilateral pleural effusions. Mild bilateral lower lobe ground-glass opacities. Upper Abdomen: Right adrenal gland lesion measuring 2.0 cm, unchanged when compared with 2014 prior and consistent with benign adenoma, no specific follow-up imaging is necessary. No acute abnormality. Musculoskeletal: No chest wall mass or suspicious bone lesions identified. IMPRESSION: 1. Mild ground-glass opacities of the bilateral lower lobes, could be due to atelectasis, infection or aspiration. 2. Trace bilateral pleural effusions. 3. Dilated pulmonary artery, findings can be seen in the setting of pulmonary hypertension. 4. Cardiomegaly, aortic Atherosclerosis (ICD10-I70.0) and Emphysema (ICD10-J43.9). Electronically Signed   By: Allegra Lai M.D.   On: 07/28/2023 15:47   DG Chest Portable 1 View  Result Date: 07/28/2023 CLINICAL DATA:  Chest pain radiating to the back EXAM: PORTABLE CHEST 1 VIEW COMPARISON:  Chest radiograph 07/10/2022 FINDINGS: The heart is enlarged, grossly unchanged. The upper mediastinal contours are stable. Lung volumes are low. There are increased interstitial markings bilaterally likely reflecting mild pulmonary interstitial edema. There are suspected trace bilateral pleural effusions with  retrocardiac opacity likely reflecting atelectasis. There is no pneumothorax There is no acute osseous abnormality. IMPRESSION: 1. Cardiomegaly with probable mild pulmonary interstitial edema. 2. Suspected trace bilateral pleural effusions with retrocardiac opacity likely reflecting atelectasis.  Superimposed infection is not excluded. Electronically Signed   By: Lesia Hausen M.D.   On: 07/28/2023 11:51    Cardiac Studies   Carotid artery ultrasound 12/28/2022: Summary:  Right Carotid: Velocities in the right ICA are consistent with a 1-39%  stenosis.  Non-hemodynamically significant plaque <50% noted in the  CCA. The ECA appears >50% stenosed. Tortuous CCA.   Left Carotid: Velocities in the left ICA are consistent with a 1-39%  stenosis.  Non-hemodynamically significant plaque <50% noted in the  CCA. The ECA appears <50% stenosed.   Vertebrals:  Bilateral vertebral arteries demonstrate antegrade flow.  Subclavians: Bilateral subclavian artery flow was disturbed.  __________   Luci Bank patch 09/2022: Normal sinus rhythm Patient had a min HR of 41 bpm, max HR of 185 bpm, and avg HR of 70 bpm.    Bundle Branch Block/IVCD was present.  1 run of Ventricular Tachycardia occurred lasting 8 beats with a max rate of 185 bpm (avg 170  bpm).    2 Supraventricular Tachycardia runs occurred, the run with the fastest interval lasting 9 beats with a max rate of 133 bpm (avg 96 bpm); the run with the fastest interval was also the longest.    Junctional Rhythm was present.    Isolated SVEs were occasional (1.9%, 23596), SVE Couplets were rare (<1.0%, 1783), and SVE Triplets were rare (<1.0%, 296).    Isolated VEs were occasional (3.4%, 42235), VE Couplets were rare (<1.0%, 1465), and VE Triplets were rare (<1.0%, 1). Ventricular Bigeminy and Trigeminy were present.   No patient triggered events recorded __________   2D echo 10/07/2021: 1. Frequent PVCs during study.   2. Difficult to determine LVEF due  to very frequent ectopy and limited  echo windows. Based on images with no ectopy, left ventricular ejection  fraction, by estimation, is 40%. The left ventricle has  mildly-to-moderately decreased function. The left  ventricle demonstrates global hypokinesis. There is paradoxical septal  motion due to known LBBB. There is moderate concentric left ventricular  hypertrophy. Left ventricular diastolic parameters are indeterminate.   3. Right ventricular systolic function is normal. The right ventricular  size is normal.   4. Left atrial size was severely dilated.   5. The mitral valve is grossly normal. Trivial mitral valve  regurgitation.   6. The aortic valve is tricuspid. There is mild calcification of the  aortic valve. There is mild thickening of the aortic valve. Aortic valve  regurgitation is not visualized. Aortic valve sclerosis/calcification is  present, without any evidence of  aortic stenosis.   7. The inferior vena cava is normal in size with greater than 50%  respiratory variability, suggesting right atrial pressure of 3 mmHg.  __________   2D echo 03/07/2021: 1. Left ventricular ejection fraction, by estimation, is 50 to 55%. The  left ventricle has low normal function. Left ventricular endocardial  border not optimally defined to evaluate regional wall motion. There is  moderate left ventricular hypertrophy.  Indeterminate diastolic filling due to E-A fusion.   2. Right ventricular systolic function is normal. The right ventricular  size is normal. Tricuspid regurgitation signal is inadequate for assessing  PA pressure.   3. Left atrial size was moderately dilated.   4. The mitral valve is abnormal. No evidence of mitral valve  regurgitation. No evidence of mitral stenosis.   5. The aortic valve has an indeterminant number of cusps. There is mild  thickening of the aortic valve. Aortic valve regurgitation is not  visualized.  Mild aortic valve sclerosis is present, with  no evidence of  aortic valve stenosis. __________   Carotid artery ultrasound 07/2020: Summary:  Right Carotid: Velocities in the right ICA are consistent with a 1-39%  stenosis.  Non-hemodynamically significant plaque <50% noted in the  CCA.  The ECA appears >50% stenosed.   Left Carotid: Velocities in the left ICA are consistent with a 1-39%  stenosis.  Non-hemodynamically significant plaque <50% noted in the  CCA.  The ECA appears <50% stenosed.   Vertebrals:  Bilateral vertebral arteries demonstrate antegrade flow.  Subclavians: Left subclavian artery was stenotic. Normal flow hemodynamics  were seen in the right subclavian artery.  __________   2D echo 09/2019: 1. Left ventricular ejection fraction, by visual estimation, is 40 to  45%. The left ventricle has moderately decreased function. There is  moderately increased left ventricular hypertrophy.   2. Abnormal septal motion consistent with left bundle branch block.   3. Left ventricular diastolic parameters are consistent with Grade I  diastolic dysfunction (impaired relaxation).   4. The left ventricle demonstrates global hypokinesis.   5. Global right ventricle has moderately reduced systolic function.The  right ventricular size is normal. Mildly increased right ventricular wall  thickness.   6. Left atrial size was moderately dilated.   7. Right atrial size was not well visualized.   8. Presence of pericardial fat pad.   9. The mitral valve was not well visualized. Trivial mitral valve  regurgitation. No evidence of mitral stenosis.  10. The tricuspid valve is not well visualized. Tricuspid valve  regurgitation is not demonstrated.  11. The aortic valve is tricuspid. Aortic valve regurgitation is not  visualized. No evidence of aortic valve sclerosis or stenosis.  12. Pulmonic regurgitation is mild.  13. The pulmonic valve was not well visualized. Pulmonic valve  regurgitation is mild.  14. TR signal is inadequate  for assessing pulmonary artery systolic  pressure.  15. The inferior vena cava is dilated in size with >50% respiratory  variability, suggesting right atrial pressure of 8 mmHg.  16. The interatrial septum was not well visualized. __________   [facility] with PCI in 06/07/2019: Mid LAD lesion is 95% stenosed. Post intervention, there is a 0% residual stenosis. A drug-eluting stent was successfully placed using a STENT RESOLUTE ONYX 2.5X22. 2nd Diag lesion is 30% stenosed.   Successful angioplasty and drug-eluting stent placement to the mid LAD.   Recommendations: Dual antiplatelet therapy for at least 1 year. Blood pressure control and cardiac rehab.  Continue treatment of risk factors.       Coronary angiography:  Coronary dominance: Right  Left mainstem:  Large vessel that bifurcates into the LAD and left circumflex, no significant disease noted  Left anterior descending (LAD):   Large vessel that extends to the apical region, diagonal branch 2 of moderate size, proximal to mid LAD prior to large diagonal with 90% stenosis, hazy, heavily calcified.  Left circumflex (LCx):  Large vessel with OM branch 2, no significant disease noted, mild to moderate proximal to mid disease estimated 40%  Right coronary artery (RCA):  Right dominant vessel with PL and PDA, no significant disease noted, very mild proximal disease estimated 30%  Left ventriculography: Aortic valve crossed for pressures, no significant there is no significant mitral regurgitation , no significant aortic valve stenosis Frequent ectopy noted  RA pressures mean of 8 RV pressure 32/7 mean 9 PA pressure 31/16 mean 21 Wedge pressure 10 Cardiac output 3.57 Cardiac index 2.02  Final Conclusions:   Culprit vessel for presentation is the proximal to mid LAD estimated at 90% Case discussed with Dr. Kirke Corin, he will attempt PCI   Recommendations:  pci as above Cardiomyopathy meds, will make changes __________   2D echo  06/2019: 1. The left ventricle has moderately reduced systolic function, with an  ejection fraction of 35-40%. The cavity size was normal. There is  moderately increased left ventricular wall thickness. Left ventricular  diastolic Doppler parameters are  indeterminate. Left ventricular diffuse hypokinesis.   2. The right ventricle has normal systolic function. The cavity was  normal. There is no increase in right ventricular wall thickness.Unable to  estimate RVSP   3. Frequent PVCs.   4. Left atrial size was moderately dilated.  Patient Profile     89 y.o. female with a hx of CAD with prior NSTEMI in 2016 and NSTEMI in 06/2019 s/p PCI/DES to the LAD, HFrEF 2/2 ICM, labile HTN, syncope felt to be vasovagal, TIA/posterior CVA, LBBB, carotid artery disease, hypothyroidism, diverticulitis, C. Dif, CKD stage 3, anemia, gout, anxiety, depression who is being seen 07/28/2023 for the evaluation of chest pain.   Assessment & Plan    NSTEMI CAD with prior PCI - patient presented with chest pain that awoke her from sleep with associated nausea found to have elevated troponin and elevated BP started on IV heparin -High-sensitivity troponin 57 >>84>77  - Ddimer was elevated>>VQ scan was negative for PE -echo showed LVEF 55-60&, mild LVH - patient completed 48 hours of IV heparin -Continue aspirin 81 mg daily, Lipitor 40 mg daily, Plavix 75 mg daily - BB held for bradycardia - started on IV nitroglycerin for persistent chest pain. Isordil 30mg  BID started  - due to flat troponin trend,  age, CKD and other comorbidities plan to continue medical management.  -  wean Nitroglycerin drip and I will increase Isordil   HFrEF/ICM - Echo with EF as low as 35-40% in 2020 - most recent echo 2023 showed LVEF 40%, moderate LVH, normal RVSF -PTA meds held including Entresto 97-103 mg twice daily, torsemide 20 mg daily, Hydralazine 50mg  TID - Hydralazine 50mg  TID restarted - Echo showed LVEF 55-60%, no WMA,  mild MR - she appears euvolemic on exam. Unsure she will tolerate all her PTA meds given kidney function   HTN - BP elevated on admission - PTA PTA Entresto 97-103 mg twice daily, hydralazine 50 mg 3 times daily, Imdur 30 mg daily - IV nitroglycerin for pain relief - restarted on Hydralazine 50mg  TID, Isordil 30mg  BID  and amlodipine 5mg  BID - Bps elevated this AM  - I will increase Isordil to 40mg  BID  HLD - LDL 47 - continue Lipitor 40mg  daily   CKD stage 3-4 - scr 1.91>1.71>1.62>1.78 - follows with nephrology as OP  For questions or updates, please contact [facility] Please consult [email or web address] for contact info under        Signed, Carmine Carrozza David Stall, PA-C  07/30/2023, 9:04 AM

## 2023-07-30 NOTE — Consult Note (Signed)
PHARMACY - ANTICOAGULATION CONSULT NOTE  Pharmacy Consult for IV Heparin Indication: chest pain/ACS  Patient Measurements: Height: 5\' 2"  (157.5 cm) Weight: 68 kg (149 lb 14.6 oz) IBW/kg (Calculated) : 50.1 Heparin Dosing Weight: 62.9 kg  Labs: Recent Labs    07/28/23 1009 07/28/23 1116 07/28/23 1225 07/28/23 1627 07/28/23 1911 07/28/23 1914 07/29/23 0531 07/29/23 1617 07/30/23 0113 07/30/23 0905  HGB 10.5*  --   --   --   --   --  9.8*  --  9.3*  --   HCT 32.0*  --   --   --   --   --  30.3*  --  27.4*  --   PLT 134*  --   --   --   --   --  129*  --  120*  --   APTT  --  32  --   --   --   --   --   --   --   --   LABPROT  --  19.3*  --   --   --   --   --   --   --   --   INR  --  1.6*  --   --   --   --   --   --   --   --   HEPARINUNFRC  --  <0.10*  --   --    < >  --  0.93* 0.26* 0.30 0.31  CREATININE 1.91*  --   --   --   --  1.71* 1.62*  --   --   --   TROPONINIHS 57*  --  66* 84*  --  77*  --   --   --   --    < > = values in this interval not displayed.   Estimated Creatinine Clearance: 20.9 mL/min (A) (by C-G formula based on SCr of 1.62 mg/dL (H)).  Medical History: Past Medical History:  Diagnosis Date   Anemia    C. difficile diarrhea    CHF (congestive heart failure) (HCC) 10/05/2021   Diverticulitis    GERD (gastroesophageal reflux disease)    Hyperlipidemia    Hypertension    Hypothyroidism    Myocardial infarction (HCC) 08/2017   TIA (transient ischemic attack)    1 approx 2015, 1 approx 2018   Vertigo    last episode several months ago   [name]    Medications:  Medication reconciliation is pending. No apparent anticoagulation prior to admission  Assessment: 89 y/o F with medical history as above presenting to the ED with chest pain radiating to the back. Pharmacy consulted to initiate and manage heparin infusion for suspected ACS.  10/24 1911 HL 0.18  10/25 0531 HL 0.93, SUPRAtherapeutic 10/25 1617 HL 0.26,  SUBtherapeutic 10/26 0113 HL 0.30, therapeutic X 1  10/26 0905 HL 0.31   Goal of Therapy:  Heparin level 0.3-0.7 units/ml Monitor platelets by anticoagulation protocol: Yes   Plan:  Heparin level is therapeutic. Will increase heparin infusion to 950 units/hr. Recheck heparin level and CBC with AM labs.   Ronnald Ramp, PharmD, BCPS 07/30/2023,9:37 AM

## 2023-07-30 NOTE — Progress Notes (Addendum)
Rounding Note    Patient Name: Stephanie Charles Date of Encounter: 07/30/2023  [facility] Cardiologist: Julien Nordmann, MD   Subjective   Confused this morning, does not realize where she is Reports she did not sleep well last night, very anxious Some delirium, granddaughter aware, at the bedside  Inpatient Medications    Scheduled Meds:  amLODipine  5 mg Oral BID   aspirin EC  81 mg Oral Daily   atorvastatin  40 mg Oral Q supper   Chlorhexidine Gluconate Cloth  6 each Topical Daily   clopidogrel  75 mg Oral Daily   hydrALAZINE  50 mg Oral TID   influenza vaccine adjuvanted  0.5 mL Intramuscular Tomorrow-1000   isosorbide dinitrate  30 mg Oral BID   levothyroxine  137 mcg Oral Q0600   pantoprazole  40 mg Oral Daily   sertraline  25 mg Oral Daily   sodium chloride flush  3 mL Intravenous Q12H   ziprasidone  20 mg Oral BID WC   Continuous Infusions:  heparin 950 Units/hr (07/30/23 1100)   nitroGLYCERIN 50 mcg/min (07/30/23 1100)   promethazine (PHENERGAN) 12.5 mg in sodium chloride 0.9 % 1,000 mL infusion     PRN Meds: acetaminophen **OR** acetaminophen, alum & mag hydroxide-simeth, hydrALAZINE, HYDROmorphone (DILAUDID) injection, melatonin, nitroGLYCERIN, ondansetron **OR** ondansetron (ZOFRAN) IV, polyethylene glycol   Vital Signs    Vitals:   07/30/23 0930 07/30/23 1000 07/30/23 1030 07/30/23 1100  BP: (!) 206/95 (!) 162/91 (!) 153/59 (!) 150/117  Pulse: (!) 106 94 92 78  Resp: (!) 27 17 18  (!) 22  Temp:      TempSrc:      SpO2: 95% 92% 92% 95%  Weight:      Height:        Intake/Output Summary (Last 24 hours) at 07/30/2023 1237 Last data filed at 07/30/2023 1100 Gross per 24 hour  Intake 552.38 ml  Output 800 ml  Net -247.62 ml      07/30/2023    1:54 AM 07/29/2023    6:30 AM 07/28/2023    7:30 PM  Last 3 Weights  Weight (lbs) 149 lb 14.6 oz 149 lb 14.6 oz 149 lb 4 oz  Weight (kg) 68 kg 68 kg 67.7 kg      Telemetry    Normal  sinus rhythm- Personally Reviewed  ECG     - Personally Reviewed  Physical Exam   GEN: No acute distress.   Neck: No JVD Cardiac: Regular, tachycardic, no murmurs, rubs, or gallops.  Respiratory: Clear to auscultation bilaterally. GI: Soft, nontender, non-distended  MS: No edema; No deformity. Neuro:  Nonfocal  Psych: Delirium, confused, anxious  Labs    High Sensitivity Troponin:   Recent Labs  Lab 07/28/23 1009 07/28/23 1225 07/28/23 1627 07/28/23 1914  TROPONINIHS 57* 66* 84* 77*     Chemistry Recent Labs  Lab 07/28/23 1009 07/28/23 1116 07/28/23 1914 07/29/23 0531  NA 140  --  137 139  K 4.0  --  4.1 4.6  CL 109  --  108 107  CO2 22  --  22 25  GLUCOSE 120*  --  147* 111*  BUN 45*  --  43* 40*  CREATININE 1.91*  --  1.71* 1.62*  CALCIUM 8.5*  --  8.2* 8.4*  MG  --   --  2.0  --   PROT  --  6.1*  --  5.3*  ALBUMIN  --  3.5  --  3.3*  AST  --  21  --  15  ALT  --  15  --  13  ALKPHOS  --  75  --  61  BILITOT  --  0.4  --  0.5  GFRNONAA 25*  --  28* 30*  ANIONGAP 9  --  7 7    Lipids  Recent Labs  Lab 07/28/23 1628  CHOL 90  TRIG 49  HDL 29*  LDLCALC 51  CHOLHDL 3.1    Hematology Recent Labs  Lab 07/28/23 1009 07/29/23 0531 07/30/23 0113  WBC 6.2 7.3 8.0  RBC 3.35* 3.16* 2.94*  HGB 10.5* 9.8* 9.3*  HCT 32.0* 30.3* 27.4*  MCV 95.5 95.9 93.2  MCH 31.3 31.0 31.6  MCHC 32.8 32.3 33.9  RDW 13.2 13.2 13.4  PLT 134* 129* 120*   Thyroid  Recent Labs  Lab 07/28/23 1628  TSH 0.549    BNP Recent Labs  Lab 07/28/23 1009  BNP 407.5*    DDimer  Recent Labs  Lab 07/28/23 1116  DDIMER 1.73*     Radiology    ECHOCARDIOGRAM COMPLETE  Result Date: 07/29/2023    ECHOCARDIOGRAM REPORT   Patient Name:   Stephanie Charles Date of Exam: 07/29/2023 Medical Rec #:  [id number]      Height:       62.0 in Accession #:    [id number]     Weight:       149.9 lb Date of Birth:  04/11/1930     BSA:          1.691 m Patient Age:    89 years       BP:            158/54 mmHg Patient Gender: F              HR:           63 bpm. Exam Location:  [facility] Procedure: 2D Echo, Color Doppler and Cardiac Doppler Indications:     Chest pain R07.9  History:         Patient has prior history of Echocardiogram examinations, most                  recent 10/07/2000. CHF, Previous Myocardial Infarction; Risk                  Factors:Hypertension and Dyslipidemia.  Sonographer:     Cristela Blue Referring Phys:  [id number] CADENCE H FURTH Diagnosing Phys: Debbe Odea MD IMPRESSIONS  1. Left ventricular ejection fraction, by estimation, is 55 to 60%. The left ventricle has normal function. The left ventricle has no regional wall motion abnormalities. There is mild left ventricular hypertrophy. Left ventricular diastolic parameters are indeterminate.  2. Right ventricular systolic function is normal. The right ventricular size is normal.  3. Left atrial size was moderately dilated.  4. The mitral valve is normal in structure. Mild mitral valve regurgitation.  5. The aortic valve was not well visualized. Aortic valve regurgitation is trivial. FINDINGS  Left Ventricle: Left ventricular ejection fraction, by estimation, is 55 to 60%. The left ventricle has normal function. The left ventricle has no regional wall motion abnormalities. The left ventricular internal cavity size was normal in size. There is  mild left ventricular hypertrophy. Left ventricular diastolic parameters are indeterminate. Right Ventricle: The right ventricular size is normal. No increase in right ventricular wall thickness. Right ventricular systolic function is normal. Left Atrium: Left atrial size was moderately dilated. Right  Atrium: Right atrial size was normal in size. Pericardium: There is no evidence of pericardial effusion. Mitral Valve: The mitral valve is normal in structure. Mild mitral valve regurgitation. MV peak gradient, 3.7 mmHg. The mean mitral valve gradient is 1.0 mmHg. Tricuspid Valve: The tricuspid  valve is normal in structure. Tricuspid valve regurgitation is mild. Aortic Valve: The aortic valve was not well visualized. Aortic valve regurgitation is trivial. Aortic valve mean gradient measures 3.5 mmHg. Aortic valve peak gradient measures 7.0 mmHg. Aortic valve area, by VTI measures 2.12 cm. Pulmonic Valve: The pulmonic valve was not well visualized. Pulmonic valve regurgitation is not visualized. Aorta: The aortic root is normal in size and structure. Venous: The inferior vena cava was not well visualized. IAS/Shunts: No atrial level shunt detected by color flow Doppler.  LEFT VENTRICLE PLAX 2D LVIDd:         2.60 cm   Diastology LVIDs:         1.90 cm   LV e' medial:    4.90 cm/s LV PW:         1.40 cm   LV E/e' medial:  13.2 LV IVS:        1.60 cm   LV e' lateral:   9.14 cm/s LVOT diam:     2.00 cm   LV E/e' lateral: 7.1 LV SV:         57 LV SV Index:   34 LVOT Area:     3.14 cm  RIGHT VENTRICLE RV Basal diam:  3.70 cm RV Mid diam:    2.80 cm RV S prime:     13.50 cm/s TAPSE (M-mode): 2.3 cm LEFT ATRIUM           Index        RIGHT ATRIUM          Index LA diam:      4.70 cm 2.78 cm/m   RA Area:     6.79 cm LA Vol (A2C): 78.8 ml 46.59 ml/m  RA Volume:   11.90 ml 7.04 ml/m LA Vol (A4C): 68.2 ml 40.32 ml/m  AORTIC VALVE AV Area (Vmax):    1.84 cm AV Area (Vmean):   2.01 cm AV Area (VTI):     2.12 cm AV Vmax:           132.00 cm/s AV Vmean:          88.700 cm/s AV VTI:            0.268 m AV Peak Grad:      7.0 mmHg AV Mean Grad:      3.5 mmHg LVOT Vmax:         77.40 cm/s LVOT Vmean:        56.700 cm/s LVOT VTI:          0.181 m LVOT/AV VTI ratio: 0.67  AORTA Ao Root diam: 2.50 cm MITRAL VALVE               TRICUSPID VALVE MV Area (PHT): 2.92 cm    TR Peak grad:   20.2 mmHg MV Area VTI:   2.29 cm    TR Vmax:        225.00 cm/s MV Peak grad:  3.7 mmHg MV Mean grad:  1.0 mmHg    SHUNTS MV Vmax:       0.97 m/s    Systemic VTI:  0.18 m MV Vmean:      52.9 cm/s   Systemic Diam: 2.00 cm  MV Decel Time:  260 msec MV E velocity: 64.50 cm/s MV A velocity: 69.70 cm/s MV E/A ratio:  0.93 Debbe Odea MD Electronically signed by Debbe Odea MD Signature Date/Time: 07/29/2023/4:49:15 PM    Final    NM Pulmonary Perfusion  Result Date: 07/29/2023 CLINICAL DATA:  Chest pain and back pain for several days. EXAM: NUCLEAR MEDICINE PERFUSION LUNG SCAN TECHNIQUE: Perfusion images were obtained in multiple projections after intravenous injection of radiopharmaceutical. RADIOPHARMACEUTICALS:  4.3 mCi Tc-66m MAA COMPARISON:  Radiograph 07/28/2023 FINDINGS: No wedge-shaped peripheral perfusion defects to suggest acute pulmonary embolism. Low lung volumes. Cardiomegaly noted. IMPRESSION: No evidence acute pulmonary embolism. Electronically Signed   By: Genevive Bi M.D.   On: 07/29/2023 12:01   US Venous Img Lower Bilateral  Result Date: 07/28/2023 CLINICAL DATA:  Bilateral lower extremity swelling for 2 days EXAM: Bilateral lower Extremity Venous Doppler Ultrasound TECHNIQUE: Gray-scale sonography with compression, as well as color and duplex ultrasound, were performed to evaluate the deep venous system(s) from the level of the common femoral vein through the popliteal and proximal calf veins. COMPARISON:  None available FINDINGS: VENOUS Normal compressibility of the common femoral, superficial femoral, and popliteal veins, as well as the visualized calf veins. Visualized portions of profunda femoral vein and great saphenous vein unremarkable. No filling defects to suggest DVT on grayscale or color Doppler imaging. Doppler waveforms show normal direction of venous flow, normal respiratory plasticity and response to augmentation. OTHER None. Limitations: none IMPRESSION: No  lower extremity DVT. Electronically Signed   By: Acquanetta Belling M.D.   On: 07/28/2023 16:44   CT Chest Wo Contrast  Result Date: 07/28/2023 CLINICAL DATA:  Chest pain EXAM: CT CHEST WITHOUT CONTRAST TECHNIQUE: Multidetector CT imaging  of the chest was performed following the standard protocol without IV contrast. RADIATION DOSE REDUCTION: This exam was performed according to the departmental dose-optimization program which includes automated exposure control, adjustment of the mA and/or kV according to patient size and/or use of iterative reconstruction technique. COMPARISON:  Chest CT dated Feb 21, 2013 FINDINGS: Cardiovascular: Marked cardiomegaly. Trace pericardial effusion. Normal caliber thoracic aorta with severe atherosclerotic disease. Dilated main pulmonary artery, measuring up to 3.2 cm. Severe coronary artery calcifications. Mediastinum/Nodes: Small hiatal hernia. No enlarged lymph nodes seen in the chest. Lungs/Pleura: Central airways are patent. Mild linear consolidation of the anterior right upper lobe, likely due to scarring or atelectasis. Trace bilateral pleural effusions. Mild bilateral lower lobe ground-glass opacities. Upper Abdomen: Right adrenal gland lesion measuring 2.0 cm, unchanged when compared with 2014 prior and consistent with benign adenoma, no specific follow-up imaging is necessary. No acute abnormality. Musculoskeletal: No chest wall mass or suspicious bone lesions identified. IMPRESSION: 1. Mild ground-glass opacities of the bilateral lower lobes, could be due to atelectasis, infection or aspiration. 2. Trace bilateral pleural effusions. 3. Dilated pulmonary artery, findings can be seen in the setting of pulmonary hypertension. 4. Cardiomegaly, aortic Atherosclerosis (ICD10-I70.0) and Emphysema (ICD10-J43.9). Electronically Signed   By: Allegra Lai M.D.   On: 07/28/2023 15:47    Cardiac Studies    Patient Profile     89 y.o. female   Assessment & Plan    Coronary artery disease with stable angina/chest pain Presenting with chest pain of unclear etiology Reported having 10/10 pain which has slowly trended down, much more comfortable this morning Minimal elevation of troponin indicating less  likely primary ischemic event Likely demand ischemia in the setting of poorly controlled hypertension -On nitro infusion, blood pressure running  high this morning in the setting of anxiety, delirium overnight, confused, making up stories, granddaughter aware she is at the bedside -Recommend we restart some of her oral medications as below -No plan for cardiac catheterization given chest pain essentially resolved Completed heparin 48 hours  Malignant hypertension Exacerbated by anxiety/delirium this morning -Will restart hydralazine 50 mg 3 times daily, isosorbide 30 twice daily, amlodipine 5 twice daily Will hold off on Entresto/ARB at this time given renal dysfunction -Hold off on beta-blocker given bradycardia after dose given in the ER shortly after admission -Blood pressure somewhat labile in the setting of her anxiety -Will wean the nitro infusion  Ischemic cardiomyopathy Prior ejection fraction 35 to 40% in 2020 EF up to 40% 2023 Ejection fraction has normalized this admission No plan for ischemic workup given age, renal dysfunction, atypical presentation with minimally elevated troponin  Chronic kidney disease stage III Creatinine 1.9 on arrival slowly trending downward Will continue to hold ARB for now Aggressive blood pressure control as above  Long discussion with nursing, family at the bedside and patient, reassurance provided to patient who is wanting to go home, confused  For questions or updates, please contact [facility] Please consult [email or web address] for contact info under        Signed, Julien Nordmann, MD  07/30/2023, 12:37 PM

## 2023-07-30 NOTE — Progress Notes (Signed)
Dr. Meriam Sprague notified that patient's daughter says patient fell prior to admission and has some knee pain. Patient states she did not hit her head. Orders to place ice to affected area.

## 2023-07-31 DIAGNOSIS — R001 Bradycardia, unspecified: Secondary | ICD-10-CM | POA: Diagnosis not present

## 2023-07-31 DIAGNOSIS — R0789 Other chest pain: Secondary | ICD-10-CM | POA: Diagnosis not present

## 2023-07-31 DIAGNOSIS — I25118 Atherosclerotic heart disease of native coronary artery with other forms of angina pectoris: Secondary | ICD-10-CM | POA: Diagnosis not present

## 2023-07-31 DIAGNOSIS — I214 Non-ST elevation (NSTEMI) myocardial infarction: Secondary | ICD-10-CM | POA: Diagnosis not present

## 2023-07-31 DIAGNOSIS — I255 Ischemic cardiomyopathy: Secondary | ICD-10-CM | POA: Diagnosis not present

## 2023-07-31 LAB — CBC WITH DIFFERENTIAL/PLATELET
Abs Immature Granulocytes: 0.03 10*3/uL (ref 0.00–0.07)
Basophils Absolute: 0 10*3/uL (ref 0.0–0.1)
Basophils Relative: 0 %
Eosinophils Absolute: 0.2 10*3/uL (ref 0.0–0.5)
Eosinophils Relative: 3 %
HCT: 30.3 % — ABNORMAL LOW (ref 36.0–46.0)
Hemoglobin: 10.1 g/dL — ABNORMAL LOW (ref 12.0–15.0)
Immature Granulocytes: 0 %
Lymphocytes Relative: 24 %
Lymphs Abs: 1.8 10*3/uL (ref 0.7–4.0)
MCH: 31.4 pg (ref 26.0–34.0)
MCHC: 33.3 g/dL (ref 30.0–36.0)
MCV: 94.1 fL (ref 80.0–100.0)
Monocytes Absolute: 0.5 10*3/uL (ref 0.1–1.0)
Monocytes Relative: 6 %
Neutro Abs: 5.2 10*3/uL (ref 1.7–7.7)
Neutrophils Relative %: 67 %
Platelets: 119 10*3/uL — ABNORMAL LOW (ref 150–400)
RBC: 3.22 MIL/uL — ABNORMAL LOW (ref 3.87–5.11)
RDW: 13.4 % (ref 11.5–15.5)
WBC: 7.8 10*3/uL (ref 4.0–10.5)
nRBC: 0 % (ref 0.0–0.2)

## 2023-07-31 LAB — BASIC METABOLIC PANEL
Anion gap: 6 (ref 5–15)
BUN: 41 mg/dL — ABNORMAL HIGH (ref 8–23)
CO2: 23 mmol/L (ref 22–32)
Calcium: 8.4 mg/dL — ABNORMAL LOW (ref 8.9–10.3)
Chloride: 108 mmol/L (ref 98–111)
Creatinine, Ser: 1.78 mg/dL — ABNORMAL HIGH (ref 0.44–1.00)
GFR, Estimated: 27 mL/min — ABNORMAL LOW (ref >60)
Glucose, Bld: 105 mg/dL — ABNORMAL HIGH (ref 70–99)
Potassium: 4.6 mmol/L (ref 3.5–5.1)
Sodium: 137 mmol/L (ref 135–145)

## 2023-07-31 LAB — HEPARIN LEVEL (UNFRACTIONATED): Heparin Unfractionated: 0.46 [IU]/mL (ref 0.30–0.70)

## 2023-07-31 MED ORDER — ISOSORBIDE DINITRATE 20 MG PO TABS: 40.0000 mg | ORAL_TABLET | Freq: Two times a day (BID) | ORAL | Status: DC

## 2023-07-31 MED ORDER — SODIUM CHLORIDE 0.9 % IV SOLN
1.0000 g | INTRAVENOUS | Status: DC
  Administered 2023-07-31 – 2023-08-02 (×3): 1 g via INTRAVENOUS
  Filled 2023-07-31 (×3): qty 10

## 2023-07-31 MED ORDER — ENOXAPARIN SODIUM 30 MG/0.3ML IJ SOSY
30.0000 mg | PREFILLED_SYRINGE | INTRAMUSCULAR | Status: DC
  Administered 2023-07-31 – 2023-08-01 (×2): 30 mg via SUBCUTANEOUS
  Filled 2023-07-31 (×2): qty 0.3

## 2023-07-31 MED ORDER — ISOSORBIDE MONONITRATE ER 60 MG PO TB24
60.0000 mg | ORAL_TABLET | Freq: Two times a day (BID) | ORAL | Status: DC
  Administered 2023-07-31 – 2023-08-02 (×5): 60 mg via ORAL
  Filled 2023-07-31: qty 2
  Filled 2023-07-31: qty 1
  Filled 2023-07-31 (×3): qty 2
  Filled 2023-07-31: qty 1

## 2023-07-31 NOTE — Consult Note (Signed)
PHARMACY - ANTICOAGULATION CONSULT NOTE  Pharmacy Consult for IV Heparin Indication: chest pain/ACS  Patient Measurements: Height: 5\' 2"  (157.5 cm) Weight: 68 kg (149 lb 14.6 oz) IBW/kg (Calculated) : 50.1 Heparin Dosing Weight: 62.9 kg  Labs: Recent Labs    07/28/23 1116 07/28/23 1225 07/28/23 1627 07/28/23 1911 07/28/23 1914 07/29/23 0531 07/29/23 1617 07/30/23 0113 07/30/23 0905 07/31/23 0544  HGB  --   --   --   --   --  9.8*  --  9.3*  --  10.1*  HCT  --   --   --   --   --  30.3*  --  27.4*  --  30.3*  PLT  --   --   --   --   --  129*  --  120*  --  119*  APTT 32  --   --   --   --   --   --   --   --   --   LABPROT 19.3*  --   --   --   --   --   --   --   --   --   INR 1.6*  --   --   --   --   --   --   --   --   --   HEPARINUNFRC <0.10*  --   --    < >  --  0.93*   < > 0.30 0.31 0.46  CREATININE  --   --   --   --  1.71* 1.62*  --   --   --  1.78*  TROPONINIHS  --  66* 84*  --  77*  --   --   --   --   --    < > = values in this interval not displayed.   Estimated Creatinine Clearance: 19 mL/min (A) (by C-G formula based on SCr of 1.78 mg/dL (H)).  Medical History: Past Medical History:  Diagnosis Date   Anemia    C. difficile diarrhea    CHF (congestive heart failure) (HCC) 10/05/2021   Diverticulitis    GERD (gastroesophageal reflux disease)    Hyperlipidemia    Hypertension    Hypothyroidism    Myocardial infarction (HCC) 08/2017   TIA (transient ischemic attack)    1 approx 2015, 1 approx 2018   Vertigo    last episode several months ago   [name]    Medications:  Medication reconciliation is pending. No apparent anticoagulation prior to admission  Assessment: 89 y/o F with medical history as above presenting to the ED with chest pain radiating to the back. Pharmacy consulted to initiate and manage heparin infusion for suspected ACS.  10/24 1911 HL 0.18  10/25 0531 HL 0.93, SUPRAtherapeutic 10/25 1617 HL 0.26,  SUBtherapeutic 10/26 0113 HL 0.30, therapeutic X 1  10/26 0905 HL 0.31, therapeutic x 2  10/27 0544 HL 0.46, therapeutic  Goal of Therapy:  Heparin level 0.3-0.7 units/ml Monitor platelets by anticoagulation protocol: Yes   Plan:  Heparin level is therapeutic. Will continue heparin infusion at 950 units/hr. Recheck heparin level and CBC with AM labs.   Clovia Cuff, PharmD, BCPS 07/31/2023 6:21 AM

## 2023-07-31 NOTE — Progress Notes (Signed)
Progress Note   Patient: Stephanie Charles ZOX:[id number] DOB: 02-16-1930 DOA: 07/28/2023     3 DOS: the patient was seen and examined on 07/31/2023   Subjective:  Confusion much better today Denies nausea vomiting worsening chest pain Admits to minimal shortness of breath I did see this patient in the presence of cardiologist on rounds this morning We will move patient from stepdown to the medical ward.   Brief hospital course: From HPI "Stephanie Charles is a 89 y.o. female with medical history significant of Coronary artery disease s/p PCI with DES to proximal/mid LAD, HFrEF with last EF of 35-40% 2/2 ischemic cardiomyopathy, CKD stage IIIb, hyperlipidemia, hypertension, hypothyroidism, TIA, anemia, who presents to the ED due to chest pain.   Stephanie Charles states that she was in her usual state of health yesterday when she awoke today with sudden onset chest pain that was central and radiating straight back to her back.  At the time, she stated she was also experiencing nausea with shortness of breath and felt as though she was going to pass out.  She denies any similar episodes in the past stating her prior heart attacks were much less painful.  At this time, she denies any lower extremity swelling, palpitations.  She notes that after receiving nitroglycerin, her chest pain severity has reduced somewhat but is still present.   Stephanie Charles states she has been very stressed lately as her daughter was recently hospitalized and has required frequent follow-up visits at [facility], and she has been going with her to these appointments   ED course: On arrival to the ED, patient was hypertensive at 198/82 with heart rate of 74.  She is saturating at 97% on room air she was afebrile at 97.7.  Initial workup notable for hemoglobin of 10.5, platelets of 134, glucose 120, BUN 45, creatinine 1.91 with GFR of 25.  BNP elevated at 407, troponin 57 and then 66.  D-dimer elevated at 1.73.  INR 1.6.  Bilateral lower  extremity duplex studies ordered and CT chest.  Cardiology consulted; recommending echocardiogram and heparin infusion.  TR[facility] contacted for admission.  "     Assessment and Plan:  NSTEMI (non-ST elevated myocardial infarction) [facility]) Patient is presenting with sudden chest pain that woke her out of her sleep with a minimal elevation of her troponin compared to baseline.  EKG with known left bundle branch block.  D-dimer elevated.  Differential at this point includes primary ischemic event versus PE.  Lower extremity Doppler is negative.   - Plan of care discussed with cardiologist - Heparin drip discontinued by cardiology - Planning to wean off nitro drip - VQ scan did not show any evidence of PE -Echo shows EF 55 to 60% with indeterminate diastolic parameters - Cardiologist on board and case discussed    HFrEF (heart failure with reduced ejection fraction) (HCC) Patient has a history of HFrEF with last EF of 40% with global hypokinesis in the setting of ischemic cardiomyopathy.She does not appear markedly hypervolemic at this time with no evidence of peripheral edema or JVD.  -Continue telemetry monitoring - Daily weights   CAD (coronary artery disease) Patient has a history of CAD s/p PCI with DES to mid/proximal LAD complicated by ischemic cardiomyopathy. Cardiologist on board and case discussed - Continue DAPT and statin - As noted above heparin per pharmacy dosing   Chronic kidney disease, stage 4 (severe) (HCC) In 2024, patient's creatinine has ranged between 1.48 and 2.29, concerning with progression of  her CKD stage IIIb to CKD stage IV.  Creatinine currently 1.91.   - Avoid nephrotoxic agents as able - Daily BMP - Strict in and out   Essential hypertension Continue home Entresto, hydralazine, Coreg Continue Imdur Titrate off nitro drip as blood pressure allows - Hydralazine 5 mg every 8 hours as needed for SBP over 180   Bilateral leg paresthesia Per chart review, this  is a chronic issue for Stephanie Charles.  DP pulses are intact although reduced on the left.   - Vitamin B12 - Consider ABIs   Adult hypothyroidism - Continue home Synthroid - TSH pending   CODE STATUS DNI DNR     Consults: Cardiology   Family Communication: No family at bedside       Physical Exam: General: Laying in bed in no acute distress.  Cardiovascular:     Rate and Rhythm: Normal rate and regular rhythm.     Heart sounds: No murmur heard. Pulmonary:     Effort: Pulmonary effort is normal. No respiratory distress.  Abdominal:     General: Bowel sounds are normal. There is no distension.     Palpations: Abdomen is soft.     Tenderness: There is no abdominal tenderness. There is no guarding.  Musculoskeletal:        General: No deformity.     Right lower leg: No edema.     Left lower leg: No edema.  Skin:    General: Skin is warm and dry.  Neurological:     General: No focal deficit present.  Psychiatric:        Mood and Affect: Mood normal.        Behavior: Behavior normal.       Data Reviewed: I have reviewed patient's vitals as well as cardiologist documentation and nursing documentation as well as labs below   Time spent: 46 minutes     Latest Ref Rng & Units 07/31/2023    5:44 AM 07/30/2023    1:13 AM 07/29/2023    5:31 AM  CBC  WBC 4.0 - 10.5 K/uL 7.8  8.0  7.3   Hemoglobin 12.0 - 15.0 g/dL 95.6  9.3  9.8   Hematocrit 36.0 - 46.0 % 30.3  27.4  30.3   Platelets 150 - 400 K/uL 119  120  129        Latest Ref Rng & Units 07/31/2023    5:44 AM 07/29/2023    5:31 AM 07/28/2023    7:14 PM  BMP  Glucose 70 - 99 mg/dL 213  086  578   BUN 8 - 23 mg/dL 41  40  43   Creatinine 0.44 - 1.00 mg/dL 4.69  6.29  5.28   Sodium 135 - 145 mmol/L 137  139  137   Potassium 3.5 - 5.1 mmol/L 4.6  4.6  4.1   Chloride 98 - 111 mmol/L 108  107  108   CO2 22 - 32 mmol/L 23  25  22    Calcium 8.9 - 10.3 mg/dL 8.4  8.4  8.2     Vitals:   07/31/23 1400 07/31/23 1500  07/31/23 1600 07/31/23 1601  BP: (!) 160/56 (!) 146/50 (!) 142/67 (!) 142/67  Pulse: 72 77 68   Resp: 18 17 (!) 21   Temp:   (!) 97.1 F (36.2 C)   TempSrc:   Axillary   SpO2: 95% 96% 95%   Weight:      Height:       \  Author: Loyce Dys, MD 07/31/2023 5:31 PM  For on call review [email or web address].

## 2023-07-31 NOTE — Progress Notes (Addendum)
Rounding Note    Patient Name: Stephanie Charles Date of Encounter: 07/30/2023  [facility] Cardiologist: Julien Nordmann, MD   Subjective   Bps elevated this AM. She completed IV heparin x 48 hours.   Mental status is much better today, back to baseline. She denies chest pain or SOB.   Inpatient Medications    Scheduled Meds:  aspirin EC  81 mg Oral Daily   atorvastatin  40 mg Oral Q supper   Chlorhexidine Gluconate Cloth  6 each Topical Daily   clopidogrel  75 mg Oral Daily   influenza vaccine adjuvanted  0.5 mL Intramuscular Tomorrow-1000   levothyroxine  137 mcg Oral Q0600   pantoprazole  40 mg Oral Daily   sertraline  25 mg Oral Daily   sodium chloride flush  3 mL Intravenous Q12H   Continuous Infusions:  epinephrine     heparin 900 Units/hr (07/30/23 0245)   nitroGLYCERIN 55 mcg/min (07/30/23 0245)   promethazine (PHENERGAN) 12.5 mg in sodium chloride 0.9 % 1,000 mL infusion     PRN Meds: acetaminophen **OR** acetaminophen, alum & mag hydroxide-simeth, hydrALAZINE, HYDROmorphone (DILAUDID) injection, melatonin, nitroGLYCERIN, ondansetron **OR** ondansetron (ZOFRAN) IV, polyethylene glycol   Vital Signs    Vitals:   07/30/23 0500 07/30/23 0600 07/30/23 0700 07/30/23 0800  BP: (!) 172/62 (!) 170/113 (!) 128/93 (!) 188/74  Pulse: 72 87 86 99  Resp: 20 17 (!) 25 19  Temp:    98.5 F (36.9 C)  TempSrc:    Oral  SpO2: 94% 93% 92% 95%  Weight:      Height:        Intake/Output Summary (Last 24 hours) at 07/30/2023 0904 Last data filed at 07/30/2023 0400 Gross per 24 hour  Intake 385.89 ml  Output 575 ml  Net -189.11 ml      07/30/2023    1:54 AM 07/29/2023    6:30 AM 07/28/2023    7:30 PM  Last 3 Weights  Weight (lbs) 149 lb 14.6 oz 149 lb 14.6 oz 149 lb 4 oz  Weight (kg) 68 kg 68 kg 67.7 kg      Telemetry    NSR HR 70s- Personally Reviewed  ECG    No new - Personally Reviewed  Physical Exam   GEN: No acute distress.   Neck: No  JVD Cardiac: RRR, no murmurs, rubs, or gallops.  Respiratory: Clear to auscultation bilaterally. GI: Soft, nontender, non-distended  MS: No edema; No deformity. Neuro:  Nonfocal  Psych: Normal affect   Labs    High Sensitivity Troponin:   Recent Labs  Lab 07/28/23 1009 07/28/23 1225 07/28/23 1627 07/28/23 1914  TROPONINIHS 57* 66* 84* 77*     Chemistry Recent Labs  Lab 07/28/23 1009 07/28/23 1116 07/28/23 1914 07/29/23 0531  NA 140  --  137 139  K 4.0  --  4.1 4.6  CL 109  --  108 107  CO2 22  --  22 25  GLUCOSE 120*  --  147* 111*  BUN 45*  --  43* 40*  CREATININE 1.91*  --  1.71* 1.62*  CALCIUM 8.5*  --  8.2* 8.4*  MG  --   --  2.0  --   PROT  --  6.1*  --  5.3*  ALBUMIN  --  3.5  --  3.3*  AST  --  21  --  15  ALT  --  15  --  13  ALKPHOS  --  75  --  61  BILITOT  --  0.4  --  0.5  GFRNONAA 25*  --  28* 30*  ANIONGAP 9  --  7 7    Lipids  Recent Labs  Lab 07/28/23 1628  CHOL 90  TRIG 49  HDL 29*  LDLCALC 51  CHOLHDL 3.1    Hematology Recent Labs  Lab 07/28/23 1009 07/29/23 0531 07/30/23 0113  WBC 6.2 7.3 8.0  RBC 3.35* 3.16* 2.94*  HGB 10.5* 9.8* 9.3*  HCT 32.0* 30.3* 27.4*  MCV 95.5 95.9 93.2  MCH 31.3 31.0 31.6  MCHC 32.8 32.3 33.9  RDW 13.2 13.2 13.4  PLT 134* 129* 120*   Thyroid  Recent Labs  Lab 07/28/23 1628  TSH 0.549    BNP Recent Labs  Lab 07/28/23 1009  BNP 407.5*    DDimer  Recent Labs  Lab 07/28/23 1116  DDIMER 1.73*     Radiology    ECHOCARDIOGRAM COMPLETE  Result Date: 07/29/2023    ECHOCARDIOGRAM REPORT   Patient Name:   RALEY KALLER Date of Exam: 07/29/2023 Medical Rec #:  [id number]      Height:       62.0 in Accession #:    [id number]     Weight:       149.9 lb Date of Birth:  Apr 03, 1930     BSA:          1.691 m Patient Age:    89 years       BP:           158/54 mmHg Patient Gender: F              HR:           63 bpm. Exam Location:  [facility] Procedure: 2D Echo, Color Doppler and Cardiac Doppler  Indications:     Chest pain R07.9  History:         Patient has prior history of Echocardiogram examinations, most                  recent 10/07/2000. CHF, Previous Myocardial Infarction; Risk                  Factors:Hypertension and Dyslipidemia.  Sonographer:     Cristela Blue Referring Phys:  [id number] Kylani Wires H Jewell Ryans Diagnosing Phys: Debbe Odea MD IMPRESSIONS  1. Left ventricular ejection fraction, by estimation, is 55 to 60%. The left ventricle has normal function. The left ventricle has no regional wall motion abnormalities. There is mild left ventricular hypertrophy. Left ventricular diastolic parameters are indeterminate.  2. Right ventricular systolic function is normal. The right ventricular size is normal.  3. Left atrial size was moderately dilated.  4. The mitral valve is normal in structure. Mild mitral valve regurgitation.  5. The aortic valve was not well visualized. Aortic valve regurgitation is trivial. FINDINGS  Left Ventricle: Left ventricular ejection fraction, by estimation, is 55 to 60%. The left ventricle has normal function. The left ventricle has no regional wall motion abnormalities. The left ventricular internal cavity size was normal in size. There is  mild left ventricular hypertrophy. Left ventricular diastolic parameters are indeterminate. Right Ventricle: The right ventricular size is normal. No increase in right ventricular wall thickness. Right ventricular systolic function is normal. Left Atrium: Left atrial size was moderately dilated. Right Atrium: Right atrial size was normal in size. Pericardium: There is no evidence of pericardial effusion. Mitral Valve: The mitral valve is normal in  structure. Mild mitral valve regurgitation. MV peak gradient, 3.7 mmHg. The mean mitral valve gradient is 1.0 mmHg. Tricuspid Valve: The tricuspid valve is normal in structure. Tricuspid valve regurgitation is mild. Aortic Valve: The aortic valve was not well visualized. Aortic valve  regurgitation is trivial. Aortic valve mean gradient measures 3.5 mmHg. Aortic valve peak gradient measures 7.0 mmHg. Aortic valve area, by VTI measures 2.12 cm. Pulmonic Valve: The pulmonic valve was not well visualized. Pulmonic valve regurgitation is not visualized. Aorta: The aortic root is normal in size and structure. Venous: The inferior vena cava was not well visualized. IAS/Shunts: No atrial level shunt detected by color flow Doppler.  LEFT VENTRICLE PLAX 2D LVIDd:         2.60 cm   Diastology LVIDs:         1.90 cm   LV e' medial:    4.90 cm/s LV PW:         1.40 cm   LV E/e' medial:  13.2 LV IVS:        1.60 cm   LV e' lateral:   9.14 cm/s LVOT diam:     2.00 cm   LV E/e' lateral: 7.1 LV SV:         57 LV SV Index:   34 LVOT Area:     3.14 cm  RIGHT VENTRICLE RV Basal diam:  3.70 cm RV Mid diam:    2.80 cm RV S prime:     13.50 cm/s TAPSE (M-mode): 2.3 cm LEFT ATRIUM           Index        RIGHT ATRIUM          Index LA diam:      4.70 cm 2.78 cm/m   RA Area:     6.79 cm LA Vol (A2C): 78.8 ml 46.59 ml/m  RA Volume:   11.90 ml 7.04 ml/m LA Vol (A4C): 68.2 ml 40.32 ml/m  AORTIC VALVE AV Area (Vmax):    1.84 cm AV Area (Vmean):   2.01 cm AV Area (VTI):     2.12 cm AV Vmax:           132.00 cm/s AV Vmean:          88.700 cm/s AV VTI:            0.268 m AV Peak Grad:      7.0 mmHg AV Mean Grad:      3.5 mmHg LVOT Vmax:         77.40 cm/s LVOT Vmean:        56.700 cm/s LVOT VTI:          0.181 m LVOT/AV VTI ratio: 0.67  AORTA Ao Root diam: 2.50 cm MITRAL VALVE               TRICUSPID VALVE MV Area (PHT): 2.92 cm    TR Peak grad:   20.2 mmHg MV Area VTI:   2.29 cm    TR Vmax:        225.00 cm/s MV Peak grad:  3.7 mmHg MV Mean grad:  1.0 mmHg    SHUNTS MV Vmax:       0.97 m/s    Systemic VTI:  0.18 m MV Vmean:      52.9 cm/s   Systemic Diam: 2.00 cm MV Decel Time: 260 msec MV E velocity: 64.50 cm/s MV A velocity: 69.70 cm/s MV E/A ratio:  0.93 Debbe Odea MD Electronically  signed by Debbe Odea MD Signature Date/Time: 07/29/2023/4:49:15 PM    Final    NM Pulmonary Perfusion  Result Date: 07/29/2023 CLINICAL DATA:  Chest pain and back pain for several days. EXAM: NUCLEAR MEDICINE PERFUSION LUNG SCAN TECHNIQUE: Perfusion images were obtained in multiple projections after intravenous injection of radiopharmaceutical. RADIOPHARMACEUTICALS:  4.3 mCi Tc-40m MAA COMPARISON:  Radiograph 07/28/2023 FINDINGS: No wedge-shaped peripheral perfusion defects to suggest acute pulmonary embolism. Low lung volumes. Cardiomegaly noted. IMPRESSION: No evidence acute pulmonary embolism. Electronically Signed   By: Genevive Bi M.D.   On: 07/29/2023 12:01   US Venous Img Lower Bilateral  Result Date: 07/28/2023 CLINICAL DATA:  Bilateral lower extremity swelling for 2 days EXAM: Bilateral lower Extremity Venous Doppler Ultrasound TECHNIQUE: Gray-scale sonography with compression, as well as color and duplex ultrasound, were performed to evaluate the deep venous system(s) from the level of the common femoral vein through the popliteal and proximal calf veins. COMPARISON:  None available FINDINGS: VENOUS Normal compressibility of the common femoral, superficial femoral, and popliteal veins, as well as the visualized calf veins. Visualized portions of profunda femoral vein and great saphenous vein unremarkable. No filling defects to suggest DVT on grayscale or color Doppler imaging. Doppler waveforms show normal direction of venous flow, normal respiratory plasticity and response to augmentation. OTHER None. Limitations: none IMPRESSION: No  lower extremity DVT. Electronically Signed   By: Acquanetta Belling M.D.   On: 07/28/2023 16:44   CT Chest Wo Contrast  Result Date: 07/28/2023 CLINICAL DATA:  Chest pain EXAM: CT CHEST WITHOUT CONTRAST TECHNIQUE: Multidetector CT imaging of the chest was performed following the standard protocol without IV contrast. RADIATION DOSE REDUCTION: This exam was performed  according to the departmental dose-optimization program which includes automated exposure control, adjustment of the mA and/or kV according to patient size and/or use of iterative reconstruction technique. COMPARISON:  Chest CT dated Feb 21, 2013 FINDINGS: Cardiovascular: Marked cardiomegaly. Trace pericardial effusion. Normal caliber thoracic aorta with severe atherosclerotic disease. Dilated main pulmonary artery, measuring up to 3.2 cm. Severe coronary artery calcifications. Mediastinum/Nodes: Small hiatal hernia. No enlarged lymph nodes seen in the chest. Lungs/Pleura: Central airways are patent. Mild linear consolidation of the anterior right upper lobe, likely due to scarring or atelectasis. Trace bilateral pleural effusions. Mild bilateral lower lobe ground-glass opacities. Upper Abdomen: Right adrenal gland lesion measuring 2.0 cm, unchanged when compared with 2014 prior and consistent with benign adenoma, no specific follow-up imaging is necessary. No acute abnormality. Musculoskeletal: No chest wall mass or suspicious bone lesions identified. IMPRESSION: 1. Mild ground-glass opacities of the bilateral lower lobes, could be due to atelectasis, infection or aspiration. 2. Trace bilateral pleural effusions. 3. Dilated pulmonary artery, findings can be seen in the setting of pulmonary hypertension. 4. Cardiomegaly, aortic Atherosclerosis (ICD10-I70.0) and Emphysema (ICD10-J43.9). Electronically Signed   By: Allegra Lai M.D.   On: 07/28/2023 15:47   DG Chest Portable 1 View  Result Date: 07/28/2023 CLINICAL DATA:  Chest pain radiating to the back EXAM: PORTABLE CHEST 1 VIEW COMPARISON:  Chest radiograph 07/10/2022 FINDINGS: The heart is enlarged, grossly unchanged. The upper mediastinal contours are stable. Lung volumes are low. There are increased interstitial markings bilaterally likely reflecting mild pulmonary interstitial edema. There are suspected trace bilateral pleural effusions with  retrocardiac opacity likely reflecting atelectasis. There is no pneumothorax There is no acute osseous abnormality. IMPRESSION: 1. Cardiomegaly with probable mild pulmonary interstitial edema. 2. Suspected trace bilateral pleural effusions with retrocardiac opacity likely reflecting atelectasis.  Superimposed infection is not excluded. Electronically Signed   By: Lesia Hausen M.D.   On: 07/28/2023 11:51    Cardiac Studies   Carotid artery ultrasound 12/28/2022: Summary:  Right Carotid: Velocities in the right ICA are consistent with a 1-39%  stenosis.  Non-hemodynamically significant plaque <50% noted in the  CCA. The ECA appears >50% stenosed. Tortuous CCA.   Left Carotid: Velocities in the left ICA are consistent with a 1-39%  stenosis.  Non-hemodynamically significant plaque <50% noted in the  CCA. The ECA appears <50% stenosed.   Vertebrals:  Bilateral vertebral arteries demonstrate antegrade flow.  Subclavians: Bilateral subclavian artery flow was disturbed.  __________   Luci Bank patch 09/2022: Normal sinus rhythm Patient had a min HR of 41 bpm, max HR of 185 bpm, and avg HR of 70 bpm.    Bundle Branch Block/IVCD was present.  1 run of Ventricular Tachycardia occurred lasting 8 beats with a max rate of 185 bpm (avg 170  bpm).    2 Supraventricular Tachycardia runs occurred, the run with the fastest interval lasting 9 beats with a max rate of 133 bpm (avg 96 bpm); the run with the fastest interval was also the longest.    Junctional Rhythm was present.    Isolated SVEs were occasional (1.9%, 23596), SVE Couplets were rare (<1.0%, 1783), and SVE Triplets were rare (<1.0%, 296).    Isolated VEs were occasional (3.4%, 42235), VE Couplets were rare (<1.0%, 1465), and VE Triplets were rare (<1.0%, 1). Ventricular Bigeminy and Trigeminy were present.   No patient triggered events recorded __________   2D echo 10/07/2021: 1. Frequent PVCs during study.   2. Difficult to determine LVEF due  to very frequent ectopy and limited  echo windows. Based on images with no ectopy, left ventricular ejection  fraction, by estimation, is 40%. The left ventricle has  mildly-to-moderately decreased function. The left  ventricle demonstrates global hypokinesis. There is paradoxical septal  motion due to known LBBB. There is moderate concentric left ventricular  hypertrophy. Left ventricular diastolic parameters are indeterminate.   3. Right ventricular systolic function is normal. The right ventricular  size is normal.   4. Left atrial size was severely dilated.   5. The mitral valve is grossly normal. Trivial mitral valve  regurgitation.   6. The aortic valve is tricuspid. There is mild calcification of the  aortic valve. There is mild thickening of the aortic valve. Aortic valve  regurgitation is not visualized. Aortic valve sclerosis/calcification is  present, without any evidence of  aortic stenosis.   7. The inferior vena cava is normal in size with greater than 50%  respiratory variability, suggesting right atrial pressure of 3 mmHg.  __________   2D echo 03/07/2021: 1. Left ventricular ejection fraction, by estimation, is 50 to 55%. The  left ventricle has low normal function. Left ventricular endocardial  border not optimally defined to evaluate regional wall motion. There is  moderate left ventricular hypertrophy.  Indeterminate diastolic filling due to E-A fusion.   2. Right ventricular systolic function is normal. The right ventricular  size is normal. Tricuspid regurgitation signal is inadequate for assessing  PA pressure.   3. Left atrial size was moderately dilated.   4. The mitral valve is abnormal. No evidence of mitral valve  regurgitation. No evidence of mitral stenosis.   5. The aortic valve has an indeterminant number of cusps. There is mild  thickening of the aortic valve. Aortic valve regurgitation is not  visualized.  Mild aortic valve sclerosis is present, with  no evidence of  aortic valve stenosis. __________   Carotid artery ultrasound 07/2020: Summary:  Right Carotid: Velocities in the right ICA are consistent with a 1-39%  stenosis.  Non-hemodynamically significant plaque <50% noted in the  CCA.  The ECA appears >50% stenosed.   Left Carotid: Velocities in the left ICA are consistent with a 1-39%  stenosis.  Non-hemodynamically significant plaque <50% noted in the  CCA.  The ECA appears <50% stenosed.   Vertebrals:  Bilateral vertebral arteries demonstrate antegrade flow.  Subclavians: Left subclavian artery was stenotic. Normal flow hemodynamics  were seen in the right subclavian artery.  __________   2D echo 09/2019: 1. Left ventricular ejection fraction, by visual estimation, is 40 to  45%. The left ventricle has moderately decreased function. There is  moderately increased left ventricular hypertrophy.   2. Abnormal septal motion consistent with left bundle branch block.   3. Left ventricular diastolic parameters are consistent with Grade I  diastolic dysfunction (impaired relaxation).   4. The left ventricle demonstrates global hypokinesis.   5. Global right ventricle has moderately reduced systolic function.The  right ventricular size is normal. Mildly increased right ventricular wall  thickness.   6. Left atrial size was moderately dilated.   7. Right atrial size was not well visualized.   8. Presence of pericardial fat pad.   9. The mitral valve was not well visualized. Trivial mitral valve  regurgitation. No evidence of mitral stenosis.  10. The tricuspid valve is not well visualized. Tricuspid valve  regurgitation is not demonstrated.  11. The aortic valve is tricuspid. Aortic valve regurgitation is not  visualized. No evidence of aortic valve sclerosis or stenosis.  12. Pulmonic regurgitation is mild.  13. The pulmonic valve was not well visualized. Pulmonic valve  regurgitation is mild.  14. TR signal is inadequate  for assessing pulmonary artery systolic  pressure.  15. The inferior vena cava is dilated in size with >50% respiratory  variability, suggesting right atrial pressure of 8 mmHg.  16. The interatrial septum was not well visualized. __________   [facility] with PCI in 06/07/2019: Mid LAD lesion is 95% stenosed. Post intervention, there is a 0% residual stenosis. A drug-eluting stent was successfully placed using a STENT RESOLUTE ONYX 2.5X22. 2nd Diag lesion is 30% stenosed.   Successful angioplasty and drug-eluting stent placement to the mid LAD.   Recommendations: Dual antiplatelet therapy for at least 1 year. Blood pressure control and cardiac rehab.  Continue treatment of risk factors.       Coronary angiography:  Coronary dominance: Right  Left mainstem:  Large vessel that bifurcates into the LAD and left circumflex, no significant disease noted  Left anterior descending (LAD):   Large vessel that extends to the apical region, diagonal branch 2 of moderate size, proximal to mid LAD prior to large diagonal with 90% stenosis, hazy, heavily calcified.  Left circumflex (LCx):  Large vessel with OM branch 2, no significant disease noted, mild to moderate proximal to mid disease estimated 40%  Right coronary artery (RCA):  Right dominant vessel with PL and PDA, no significant disease noted, very mild proximal disease estimated 30%  Left ventriculography: Aortic valve crossed for pressures, no significant there is no significant mitral regurgitation , no significant aortic valve stenosis Frequent ectopy noted  RA pressures mean of 8 RV pressure 32/7 mean 9 PA pressure 31/16 mean 21 Wedge pressure 10 Cardiac output 3.57 Cardiac index 2.02  Final Conclusions:   Culprit vessel for presentation is the proximal to mid LAD estimated at 90% Case discussed with Dr. Kirke Corin, he will attempt PCI   Recommendations:  pci as above Cardiomyopathy meds, will make changes __________   2D echo  06/2019: 1. The left ventricle has moderately reduced systolic function, with an  ejection fraction of 35-40%. The cavity size was normal. There is  moderately increased left ventricular wall thickness. Left ventricular  diastolic Doppler parameters are  indeterminate. Left ventricular diffuse hypokinesis.   2. The right ventricle has normal systolic function. The cavity was  normal. There is no increase in right ventricular wall thickness.Unable to  estimate RVSP   3. Frequent PVCs.   4. Left atrial size was moderately dilated.  Patient Profile     89 y.o. female with a hx of CAD with prior NSTEMI in 2016 and NSTEMI in 06/2019 s/p PCI/DES to the LAD, HFrEF 2/2 ICM, labile HTN, syncope felt to be vasovagal, TIA/posterior CVA, LBBB, carotid artery disease, hypothyroidism, diverticulitis, C. Dif, CKD stage 3, anemia, gout, anxiety, depression who is being seen 07/28/2023 for the evaluation of chest pain.   Assessment & Plan    NSTEMI CAD with prior PCI - patient presented with chest pain that awoke her from sleep with associated nausea found to have elevated troponin and elevated BP started on IV heparin -High-sensitivity troponin 57 >>84>77  - Ddimer was elevated>>VQ scan was negative for PE -echo showed LVEF 55-60&, mild LVH - patient completed 48 hours of IV heparin -Continue aspirin 81 mg daily, Lipitor 40 mg daily, Plavix 75 mg daily - BB held for bradycardia - started on IV nitroglycerin for persistent chest pain. Started on Imdur - due to flat troponin trend,  age, CKD and other comorbidities plan to continue medical management.  -  wean Nitroglycerin drip and increase Imdur   HFrEF/ICM - Echo with EF as low as 35-40% in 2020 - most recent echo 2023 showed LVEF 40%, moderate LVH, normal RVSF -PTA meds held including Entresto 97-103 mg twice daily, torsemide 20 mg daily, Hydralazine 50mg  TID - Hydralazine 50mg  TID restarted - Echo showed LVEF 55-60%, no WMA, mild MR - she  appears euvolemic on exam. Unsure she will tolerate all her PTA meds given kidney function   HTN - BP elevated on admission - PTA PTA Entresto 97-103 mg twice daily, hydralazine 50 mg 3 times daily, Imdur 30 mg daily - IV nitroglycerin for pain relief - restarted on Hydralazine 50mg  TID, Imdur  and amlodipine 5mg  BID - Bps elevated this AM  -increase Imdur to 60mg  BID  HLD - LDL 47 - continue Lipitor 40mg  daily   CKD stage 3-4 - scr 1.91>1.71>1.62>1.78 - follows with nephrology as OP  For questions or updates, please contact [facility] Please consult [email or web address] for contact info under       Signed, Kristal Perl David Stall, PA-C  07/31/2023, 10:19 AM

## 2023-07-31 NOTE — TOC CM/SW Note (Signed)
PT recommends SNF. Attempted call to Ander Slade (daughter) to discuss. Left VM requesting return call.  Alfonso Ramus, LCSW Transitions of Care Department [phone]

## 2023-07-31 NOTE — Evaluation (Signed)
Physical Therapy Evaluation Patient Details Name: Stephanie Charles MRN: [id number] DOB: 10-08-1929 Today's Date: 07/31/2023  History of Present Illness  Pt is a 89 y/o F admitted on 07/28/23 after presenting to the ED with c/o chest pain. Pt is being treated for NSTEMI. PMH: CAD s/p PCI, HFrEF, CKD 3B, HLD, HTN, hypothyroidism, TIA, anemia  Clinical Impression  Pt seen for PT evaluation with pt agreeable, granddaughter present in room. Pt reports prior to admission she was independent without AD but endorses 2 falls in the past 6 months. On this date, pt relies on hospital bed features to complete supine>sit with supervision, STS with min assist, and gait in room with RW & min assist. Pt reports BLE fatigue after gait. Will continue to follow pt acutely to address strengthening, balance, endurance, & gait with LRAD.        If plan is discharge home, recommend the following: A little help with walking and/or transfers;A little help with bathing/dressing/bathroom;Assistance with cooking/housework;Assist for transportation;Help with stairs or ramp for entrance   Can travel by private vehicle   Yes    Equipment Recommendations Rolling walker (2 wheels)  Recommendations for Other Services  OT consult    Functional Status Assessment Patient has had a recent decline in their functional status and demonstrates the ability to make significant improvements in function in a reasonable and predictable amount of time.     Precautions / Restrictions Precautions Precautions: Fall Restrictions Weight Bearing Restrictions: No      Mobility  Bed Mobility Overal bed mobility: Needs Assistance Bed Mobility: Supine to Sit     Supine to sit: Supervision, HOB elevated, Used rails          Transfers Overall transfer level: Needs assistance Equipment used: Rolling walker (2 wheels) Transfers: Sit to/from Stand, Bed to chair/wheelchair/BSC Sit to Stand: Min assist   Step pivot transfers:  Contact guard assist (with RW)       General transfer comment: STS from EOB, recliner with RW & min assist, cuing re: hand placement    Ambulation/Gait Ambulation/Gait assistance: Min assist Gait Distance (Feet): 18 Feet (to door & back x 2) Assistive device: Rolling walker (2 wheels) Gait Pattern/deviations: Decreased step length - right, Decreased step length - left, Decreased stride length Gait velocity: decreased        Stairs            Wheelchair Mobility     Tilt Bed    Modified Rankin (Stroke Patients Only)       Balance Overall balance assessment: Needs assistance Sitting-balance support: Feet supported Sitting balance-Leahy Scale: Fair     Standing balance support: During functional activity, Bilateral upper extremity supported, Reliant on assistive device for balance Standing balance-Leahy Scale: Poor                               Pertinent Vitals/Pain Pain Assessment Pain Assessment: Faces Faces Pain Scale: Hurts a little bit Pain Location: stomach 2/2 constipation - nurse made aware Pain Descriptors / Indicators: Discomfort Pain Intervention(s):  (nurse made aware)    Home Living Family/patient expects to be discharged to:: Private residence Living Arrangements: Children;Other relatives (granddaughter & granddaughter's 72 y/o nephew, pt's daughter who's 26 y/o) Available Help at Discharge: Family (could pull together 24 hr assist if pt really needed it) Type of Home: House Home Access: Stairs to enter   [employer] of Steps: single threshold step  Home Layout: Able to live on main level with bedroom/bathroom;Two level Home Equipment: Rollator (4 wheels);Cane - single point      Prior Function               Mobility Comments: Ambulatory without AD, 2 falls in the past 6 months.       Extremity/Trunk Assessment   Upper Extremity Assessment Upper Extremity Assessment: Generalized weakness    Lower  Extremity Assessment Lower Extremity Assessment: Generalized weakness    Cervical / Trunk Assessment Cervical / Trunk Assessment: Kyphotic  Communication   Communication Communication: No apparent difficulties  Cognition Arousal: Alert Behavior During Therapy: WFL for tasks assessed/performed Overall Cognitive Status: Within Functional Limits for tasks assessed                                 General Comments: AxOx4, pleasant,follows commands well throughout session        General Comments General comments (skin integrity, edema, etc.): VSS throughout session    Exercises     Assessment/Plan    PT Assessment Patient needs continued PT services  PT Problem List Decreased strength;Cardiopulmonary status limiting activity;Decreased activity tolerance;Decreased mobility;Decreased balance;Decreased safety awareness;Decreased knowledge of use of DME       PT Treatment Interventions DME instruction;Balance training;Gait training;Neuromuscular re-education;Modalities;Stair training;Cognitive remediation;Functional mobility training;Patient/family education;Therapeutic activities;Therapeutic exercise;Manual techniques    PT Goals (Current goals can be found in the Care Plan section)  Acute Rehab PT Goals Patient Stated Goal: get better PT Goal Formulation: With patient/family Time For Goal Achievement: 08/14/23 Potential to Achieve Goals: Good    Frequency Min 1X/week     Co-evaluation               AM-PAC PT "6 Clicks" Mobility  Outcome Measure Help needed turning from your back to your side while in a flat bed without using bedrails?: None Help needed moving from lying on your back to sitting on the side of a flat bed without using bedrails?: A Little Help needed moving to and from a bed to a chair (including a wheelchair)?: A Little Help needed standing up from a chair using your arms (e.g., wheelchair or bedside chair)?: A Little Help needed to walk  in hospital room?: A Little Help needed climbing 3-5 steps with a railing? : A Lot 6 Click Score: 18    End of Session   Activity Tolerance: Patient tolerated treatment well;Patient limited by fatigue Patient left: in chair;with call bell/phone within reach;with family/visitor present Nurse Communication: Mobility status (c/o pain 2/2 constipation) PT Visit Diagnosis: Muscle weakness (generalized) (M62.81);Other abnormalities of gait and mobility (R26.89);Unsteadiness on feet (R26.81);Difficulty in walking, not elsewhere classified (R26.2)    Time: 1308-6578 PT Time Calculation (min) (ACUTE ONLY): 18 min   Charges:   PT Evaluation $PT Eval Low Complexity: 1 Low   PT General Charges $$ ACUTE PT VISIT: 1 Visit         Aleda Grana, PT, DPT 07/31/23, 2:51 PM   Sandi Mariscal 07/31/2023, 2:50 PM

## 2023-08-01 DIAGNOSIS — R0789 Other chest pain: Secondary | ICD-10-CM | POA: Diagnosis not present

## 2023-08-01 DIAGNOSIS — I214 Non-ST elevation (NSTEMI) myocardial infarction: Secondary | ICD-10-CM | POA: Diagnosis not present

## 2023-08-01 LAB — CBC WITH DIFFERENTIAL/PLATELET
Abs Immature Granulocytes: 0.03 10*3/uL (ref 0.00–0.07)
Basophils Absolute: 0 10*3/uL (ref 0.0–0.1)
Basophils Relative: 0 %
Eosinophils Absolute: 0.2 10*3/uL (ref 0.0–0.5)
Eosinophils Relative: 3 %
HCT: 28.9 % — ABNORMAL LOW (ref 36.0–46.0)
Hemoglobin: 9.5 g/dL — ABNORMAL LOW (ref 12.0–15.0)
Immature Granulocytes: 0 %
Lymphocytes Relative: 17 %
Lymphs Abs: 1.3 10*3/uL (ref 0.7–4.0)
MCH: 30.7 pg (ref 26.0–34.0)
MCHC: 32.9 g/dL (ref 30.0–36.0)
MCV: 93.5 fL (ref 80.0–100.0)
Monocytes Absolute: 0.5 10*3/uL (ref 0.1–1.0)
Monocytes Relative: 6 %
Neutro Abs: 5.8 10*3/uL (ref 1.7–7.7)
Neutrophils Relative %: 74 %
Platelets: 132 10*3/uL — ABNORMAL LOW (ref 150–400)
RBC: 3.09 MIL/uL — ABNORMAL LOW (ref 3.87–5.11)
RDW: 13.6 % (ref 11.5–15.5)
WBC: 7.8 10*3/uL (ref 4.0–10.5)
nRBC: 0 % (ref 0.0–0.2)

## 2023-08-01 LAB — BASIC METABOLIC PANEL
Anion gap: 6 (ref 5–15)
BUN: 41 mg/dL — ABNORMAL HIGH (ref 8–23)
CO2: 23 mmol/L (ref 22–32)
Calcium: 8.5 mg/dL — ABNORMAL LOW (ref 8.9–10.3)
Chloride: 107 mmol/L (ref 98–111)
Creatinine, Ser: 1.68 mg/dL — ABNORMAL HIGH (ref 0.44–1.00)
GFR, Estimated: 29 mL/min — ABNORMAL LOW (ref >60)
Glucose, Bld: 96 mg/dL (ref 70–99)
Potassium: 4.8 mmol/L (ref 3.5–5.1)
Sodium: 136 mmol/L (ref 135–145)

## 2023-08-01 LAB — HEPARIN LEVEL (UNFRACTIONATED): Heparin Unfractionated: 0.15 [IU]/mL — ABNORMAL LOW (ref 0.30–0.70)

## 2023-08-01 MED ORDER — AMITRIPTYLINE HCL 10 MG PO TABS
10.0000 mg | ORAL_TABLET | Freq: Every day | ORAL | Status: DC
  Administered 2023-08-01: 10 mg via ORAL
  Filled 2023-08-01 (×2): qty 1

## 2023-08-01 MED ORDER — BISACODYL 10 MG RE SUPP
10.0000 mg | Freq: Every day | RECTAL | Status: DC | PRN
  Filled 2023-08-01: qty 1

## 2023-08-01 MED ORDER — MIRTAZAPINE 15 MG PO TABS
30.0000 mg | ORAL_TABLET | Freq: Every day | ORAL | Status: DC
  Administered 2023-08-01: 30 mg via ORAL
  Filled 2023-08-01: qty 2

## 2023-08-01 MED ORDER — INFLUENZA VAC A&B SURF ANT ADJ 0.5 ML IM SUSY: 0.5000 mL | PREFILLED_SYRINGE | Freq: Once | INTRAMUSCULAR | Status: DC

## 2023-08-01 NOTE — Plan of Care (Signed)

## 2023-08-01 NOTE — Evaluation (Signed)
Occupational Therapy Evaluation Patient Details Name: Stephanie Charles MRN: [id number] DOB: [birth year] Today's Date: 08/01/2023   History of Present Illness Pt is a 89 y/o F admitted on 07/28/23 after presenting to the ED with c/o chest pain. Pt is being treated for NSTEMI. PMH: CAD s/p PCI, HFrEF, CKD 3B, HLD, HTN, hypothyroidism, TIA, anemia   Clinical Impression   Pt was seen for OT evaluation this date. Prior to hospital admission, pt was ambulating without AD and indep with ADL, meal prep, housekeeping, med mgt, and short distance familiar route driving. Pt lives with several family members. Pt presents to acute OT demonstrating impaired ADL performance and functional mobility (See OT problem list for additional functional deficits). Pt currently requires supv for bed mobility, SBA-CGA for ADL transfers with RW from EOB and from [facility], and PRN MIN A for LB ADL tasks. Pt ambulated 10'+30' with RW and SBA before endorsing mild fatigue, 5/10 rate of perceived exertion. VSS. Pt eager to return home. Pt would benefit from skilled OT services to address noted impairments and functional limitations (see below for any additional details) in order to maximize safety and independence while minimizing falls risk and caregiver burden. Care team notified.     If plan is discharge home, recommend the following: A little help with walking and/or transfers;A little help with bathing/dressing/bathroom;Assist for transportation;Help with stairs or ramp for entrance    Functional Status Assessment  Patient has had a recent decline in their functional status and demonstrates the ability to make significant improvements in function in a reasonable and predictable amount of time.  Equipment Recommendations  BSC/3in1;Other (comment) (2WW)    Recommendations for Other Services       Precautions / Restrictions Precautions Precautions: Fall Restrictions Weight Bearing Restrictions: No      Mobility Bed  Mobility Overal bed mobility: Needs Assistance Bed Mobility: Supine to Sit     Supine to sit: Supervision, HOB elevated, Used rails          Transfers Overall transfer level: Needs assistance Equipment used: Rolling walker (2 wheels) Transfers: Sit to/from Stand Sit to Stand: Contact guard assist, Supervision           General transfer comment: VC for hand placement, initial posterior lean against EOB but improves with subsequent attempts and VC      Balance Overall balance assessment: Needs assistance Sitting-balance support: Feet supported Sitting balance-Leahy Scale: Fair     Standing balance support: During functional activity, Bilateral upper extremity supported, Reliant on assistive device for balance Standing balance-Leahy Scale: Fair Standing balance comment: fair-  static standing, poor+ dynamic standing                           ADL either performed or assessed with clinical judgement   ADL Overall ADL's : Needs assistance/impaired                                       General ADL Comments: Pt currently requires PRN MIN A For LB ADL tasks, SBA-CGA for ADL transfers and mobility with 2WW including EOB, recliner, and to BSC.     Vision         Perception         Praxis         Pertinent Vitals/Pain Pain Assessment Pain Assessment: 0-10 Pain Score: 5  Pain Location:  stomach 2/2 constipation Pain Descriptors / Indicators: Aching Pain Intervention(s): Monitored during session, Repositioned     Extremity/Trunk Assessment Upper Extremity Assessment Upper Extremity Assessment: Generalized weakness   Lower Extremity Assessment Lower Extremity Assessment: Generalized weakness   Cervical / Trunk Assessment Cervical / Trunk Assessment: Kyphotic   Communication Communication Communication: No apparent difficulties   Cognition Arousal: Alert Behavior During Therapy: WFL for tasks assessed/performed Overall Cognitive  Status: Within Functional Limits for tasks assessed                                       General Comments       Exercises Other Exercises Other Exercises: Pt ambulated in room 10' to [facility] with RW and SBA. Pt ambulated another 34' with RW and no overt LOB. Endorsed 5/10 rate of perceived exertion.   Shoulder Instructions      Home Living Family/patient expects to be discharged to:: Private residence Living Arrangements: Children;Other relatives (granddaughter & granddaughter's 49 y/o nephew, pt's daughter who's 71 y/o) Available Help at Discharge: Family (could pull together 24 hr assist if pt really needed it) Type of Home: House Home Access: Stairs to enter [employer] of Steps: single threshold step   Home Layout: Able to live on main level with bedroom/bathroom;Two level     Bathroom Shower/Tub: Runner, broadcasting/film/video: Rollator (4 wheels);Cane - single point;Shower seat - built in;Hand held shower head          Prior Functioning/Environment               Mobility Comments: Ambulatory without AD, 2 falls in the past 6 months. ADLs Comments: indep with ADL, meals, housekeeping, meds, sold her car and will drive family's car short familiar distances        OT Problem List: Decreased strength;Decreased activity tolerance;Impaired balance (sitting and/or standing);Decreased knowledge of use of DME or AE      OT Treatment/Interventions: Therapeutic exercise;Self-care/ADL training;Therapeutic activities;Energy conservation;DME and/or AE instruction;Patient/family education;Balance training    OT Goals(Current goals can be found in the care plan section) Acute Rehab OT Goals Patient Stated Goal: go home OT Goal Formulation: With patient Time For Goal Achievement: 08/15/23 Potential to Achieve Goals: Good ADL Goals Pt Will Perform Upper Body Dressing: with modified independence Pt Will Perform Lower Body Dressing: with  modified independence Pt Will Transfer to Toilet: with modified independence;ambulating (LRAD) Pt Will Perform Toileting - Clothing Manipulation and hygiene: with modified independence Pt Will Perform Tub/Shower Transfer: with supervision;ambulating;shower seat  OT Frequency: Min 1X/week    Co-evaluation              AM-PAC OT "6 Clicks" Daily Activity     Outcome Measure Help from another person eating meals?: None Help from another person taking care of personal grooming?: None Help from another person toileting, which includes using toliet, bedpan, or urinal?: A Little Help from another person bathing (including washing, rinsing, drying)?: A Little Help from another person to put on and taking off regular upper body clothing?: None Help from another person to put on and taking off regular lower body clothing?: A Little 6 Click Score: 21   End of Session Equipment Utilized During Treatment: Rolling walker (2 wheels) Nurse Communication: Mobility status  Activity Tolerance: Patient tolerated treatment well Patient left: in chair;with call bell/phone within reach;with chair alarm set  OT Visit Diagnosis: Other abnormalities of gait and mobility (R26.89);Muscle weakness (generalized) (M62.81);History of falling (Z91.81)                Time: 0981-1914 OT Time Calculation (min): 31 min Charges:  OT General Charges $OT Visit: 1 Visit OT Evaluation $OT Eval Low Complexity: 1 Low OT Treatments $Therapeutic Activity: 8-22 mins  Arman Filter., MPH, MS, OTR/L ascom [phone] 08/01/23, 11:19 AM

## 2023-08-01 NOTE — Progress Notes (Signed)
Progress Note   Patient: Stephanie Charles WJX:[id number] DOB: Nov 01, 1929 DOA: 07/28/2023     4 DOS: the patient was seen and examined on 08/01/2023    Subjective:  Patient admits to improvement in shortness of breath Denies chest pain nausea vomiting or abdominal pain Has been evaluated by PT OT and making significant progress for possible discharge home with home health when medically stable     Brief hospital course: From HPI "Stephanie Charles is a 89 y.o. female with medical history significant of Coronary artery disease s/p PCI with DES to proximal/mid LAD, HFrEF with last EF of 35-40% 2/2 ischemic cardiomyopathy, CKD stage IIIb, hyperlipidemia, hypertension, hypothyroidism, TIA, anemia, who presents to the ED due to chest pain.   Stephanie Charles states that she was in her usual state of health  when she awoke today with sudden onset chest pain that was central and radiating straight back to her back.   ED course: On arrival to the ED, patient was hypertensive at 198/82 with heart rate of 74.  She is saturating at 97% on room air she was afebrile at 97.7.  Initial workup notable for hemoglobin of 10.5, platelets of 134, glucose 120, BUN 45, creatinine 1.91 with GFR of 25.  BNP elevated at 407, troponin 57 and then 66.  D-dimer elevated at 1.73.  INR 1.6.  Bilateral lower extremity duplex studies ordered and CT chest.  Cardiology consulted; recommending echocardiogram and heparin infusion.  TRH contacted for admission.  "     Assessment and Plan:  NSTEMI (non-ST elevated myocardial infarction) [facility] Patient is presenting with sudden chest pain that woke her out of her sleep with a minimal elevation of her troponin compared to baseline.  EKG with known left bundle branch block.  D-dimer elevated.  Differential at this point includes primary ischemic event versus PE.  Lower extremity Doppler is negative.   - Plan of care discussed with cardiologist - Heparin drip discontinued by cardiology -  Nitro drip has been weaned off - VQ scan did not show any evidence of PE -Echo shows EF 55 to 60% with indeterminate diastolic parameters - Cardiologist on board and case discussed     HFrEF (heart failure with reduced ejection fraction) (HCC) Patient has a history of HFrEF with last EF of 40% with global hypokinesis in the setting of ischemic cardiomyopathy.She does not appear markedly hypervolemic at this time with no evidence of peripheral edema or JVD.  -Continue telemetry monitoring - Daily weights Entresto and torsemide on hold on account of renal function Continue Imdur Beta-blocker on hold on account of bradycardia  CAD (coronary artery disease) Patient has a history of CAD s/p PCI with DES to mid/proximal LAD complicated by ischemic cardiomyopathy. Cardiologist on board and case discussed - Continue DAPT and statin - Currently off heparin drip   Chronic kidney disease, stage 4 (severe) (HCC) In 2024, patient's creatinine has ranged between 1.48 and 2.29, concerning with progression of her CKD stage IIIb to CKD stage IV.  Creatinine currently 1.91.   - Avoid nephrotoxic agents as able - Continue to monitor renal function closely Monitor input and output   Essential hypertension Continue antihypertensives as above Nitro drip has been weaned off - Hydralazine 5 mg every 8 hours as needed for SBP over 180   Bilateral leg paresthesia Per chart review, this is a chronic issue for Stephanie Charles.  DP pulses are intact although reduced on the left.   - Vitamin B12 - Consider ABIs  Adult hypothyroidism - Continue home Synthroid - TSH pending   CODE STATUS DNI DNR     Consults: Cardiology   Family Communication: No family at bedside       Physical Exam: General: Laying in bed in no acute distress.  Cardiovascular:     Rate and Rhythm: Normal rate and regular rhythm.     Heart sounds: No murmur heard. Pulmonary:     Effort: Pulmonary effort is normal. No  respiratory distress.  Abdominal:     General: Bowel sounds are normal. There is no distension.     Palpations: Abdomen is soft.     Tenderness: There is no abdominal tenderness. There is no guarding.  Musculoskeletal:        General: No deformity.     Right lower leg: No edema.     Left lower leg: No edema.  Skin:    General: Skin is warm and dry.  Neurological:     General: No focal deficit present.  Psychiatric:        Mood and Affect: Mood normal.        Behavior: Behavior normal.       Data Reviewed: I have reviewed patient's labs as well as vitals as shown below   Time spent: 40 minutes        Latest Ref Rng & Units 08/01/2023    3:11 AM 07/31/2023    5:44 AM 07/29/2023    5:31 AM  BMP  Glucose 70 - 99 mg/dL 96  657  846   BUN 8 - 23 mg/dL 41  41  40   Creatinine 0.44 - 1.00 mg/dL 9.62  9.52  8.41   Sodium 135 - 145 mmol/L 136  137  139   Potassium 3.5 - 5.1 mmol/L 4.8  4.6  4.6   Chloride 98 - 111 mmol/L 107  108  107   CO2 22 - 32 mmol/L 23  23  25    Calcium 8.9 - 10.3 mg/dL 8.5  8.4  8.4      Vitals:   08/01/23 1200 08/01/23 1300 08/01/23 1400 08/01/23 1500  BP: (!) 155/70 (!) 187/60 (!) 161/58   Pulse: 71 74 70   Resp: (!) 23 (!) 21 (!) 22 14  Temp: 98.3 F (36.8 C)     TempSrc:      SpO2: 97% 96% 97%   Weight:      Height:          Latest Ref Rng & Units 08/01/2023    3:11 AM 07/31/2023    5:44 AM 07/30/2023    1:13 AM  CBC  WBC 4.0 - 10.5 K/uL 7.8  7.8  8.0   Hemoglobin 12.0 - 15.0 g/dL 9.5  32.4  9.3   Hematocrit 36.0 - 46.0 % 28.9  30.3  27.4   Platelets 150 - 400 K/uL 132  119  120      Author: Loyce Dys, MD 08/01/2023 4:47 PM  For on call review [email or web address].

## 2023-08-01 NOTE — Progress Notes (Signed)
Physical Therapy Treatment Patient Details Name: Stephanie Charles MRN: [id number] DOB: 1930/07/27 Today's Date: 08/01/2023   History of Present Illness Pt is a 89 y/o F admitted on 07/28/23 after presenting to the ED with c/o chest pain. Pt is being treated for NSTEMI. PMH: CAD s/p PCI, HFrEF, CKD 3B, HLD, HTN, hypothyroidism, TIA, anemia    PT Comments  Pt received in recliner and agreed to PT session. Pt reports stomach pain that has been consistent due to constipation. Pt performed STS with the use of RW (2wheels) supervision with the need of VC for RW management. When standing, pt did not report any s/sx relative to dizziness. Pt amb to hallway and back to recliner CGA prior to ending session. Pt amb ~75 ft prior to returning to recliner due to fatigue. VC necessary for posture and RW management while amb. Pt tolerated Tx well and will continue to benefit from skilled PT sessions to improve activity tolerance, endurance, strength, and functional mobility. Conversation with team via secure chat following session regarding possible change in d/c disposition.    If plan is discharge home, recommend the following: A little help with walking and/or transfers;A little help with bathing/dressing/bathroom;Assistance with cooking/housework;Assist for transportation;Help with stairs or ramp for entrance   Can travel by private vehicle     Yes  Equipment Recommendations  Rolling walker (2 wheels)    Recommendations for Other Services       Precautions / Restrictions Precautions Precautions: Fall Restrictions Weight Bearing Restrictions: No     Mobility  Bed Mobility               General bed mobility comments: Pt recieved in recliner. Bed mobility deferred.    Transfers Overall transfer level: Needs assistance Equipment used: Rolling walker (2 wheels) Transfers: Sit to/from Stand Sit to Stand: Supervision           General transfer comment: VC necessary to ensure  appropriate use of RW.    Ambulation/Gait Ambulation/Gait assistance: Contact guard assist Gait Distance (Feet): 75 Feet Assistive device: Rolling walker (2 wheels) Gait Pattern/deviations: Decreased step length - right, Decreased step length - left, Decreased stride length Gait velocity: decreased     General Gait Details: Pt amb with the use of RW(2wheels) CGA. VC necessary for posture adjustments and RW management.   Stairs             Wheelchair Mobility     Tilt Bed    Modified Rankin (Stroke Patients Only)       Balance Overall balance assessment: Needs assistance Sitting-balance support: Feet supported Sitting balance-Leahy Scale: Fair     Standing balance support: During functional activity, Bilateral upper extremity supported, Reliant on assistive device for balance Standing balance-Leahy Scale: Fair                              Cognition Arousal: Alert Behavior During Therapy: WFL for tasks assessed/performed Overall Cognitive Status: Within Functional Limits for tasks assessed                                 General Comments: AOx4. Pt pleasant and willing to participate in PT session.        Exercises      General Comments        Pertinent Vitals/Pain Pain Assessment Pain Assessment: Faces Faces Pain Scale: Hurts a little  bit Pain Location: stomach 2/2 constipation Pain Descriptors / Indicators: Aching Pain Intervention(s): Monitored during session    Home Living Family/patient expects to be discharged to:: Private residence Living Arrangements: Children;Other relatives (granddaughter & granddaughter's 95 y/o nephew, pt's daughter who's 54 y/o) Available Help at Discharge: Family (could pull together 24 hr assist if pt really needed it) Type of Home: House Home Access: Stairs to enter   [employer] of Steps: single threshold step   Home Layout: Able to live on main level with  bedroom/bathroom;Two level Home Equipment: Rollator (4 wheels);Cane - single point;Shower seat - built in;Hand held shower head      Prior Function            PT Goals (current goals can now be found in the care plan section) Acute Rehab PT Goals Patient Stated Goal: get better PT Goal Formulation: With patient/family Time For Goal Achievement: 08/14/23 Potential to Achieve Goals: Good Progress towards PT goals: Progressing toward goals    Frequency    Min 1X/week      PT Plan      Co-evaluation              AM-PAC PT "6 Clicks" Mobility   Outcome Measure  Help needed turning from your back to your side while in a flat bed without using bedrails?: None Help needed moving from lying on your back to sitting on the side of a flat bed without using bedrails?: A Little Help needed moving to and from a bed to a chair (including a wheelchair)?: A Little Help needed standing up from a chair using your arms (e.g., wheelchair or bedside chair)?: A Little Help needed to walk in hospital room?: A Little Help needed climbing 3-5 steps with a railing? : A Lot 6 Click Score: 18    End of Session   Activity Tolerance: Patient tolerated treatment well;Patient limited by fatigue Patient left: in chair;with call bell/phone within reach Nurse Communication: Mobility status PT Visit Diagnosis: Muscle weakness (generalized) (M62.81);Other abnormalities of gait and mobility (R26.89);Unsteadiness on feet (R26.81);Difficulty in walking, not elsewhere classified (R26.2)     Time: 1610-9604 PT Time Calculation (min) (ACUTE ONLY): 18 min  Charges:    $Gait Training: 8-22 mins PT General Charges $$ ACUTE PT VISIT: 1 Visit                     Cale Decarolis Sauvignon Howard SPT, LAT, ATC    Tyrena Gohr Sauvignon-Howard 08/01/2023, 1:57 PM

## 2023-08-01 NOTE — Progress Notes (Signed)
   Patient Name: Stephanie Charles Date of Encounter: 08/01/2023 [facility] Cardiologist: Julien Nordmann, MD   Interval Summary  .    Patient seen on AM rounds. Resting in bed with eyes closed. Awakens to name being called. 48 hours of IV Heparin is complete. Mental status much clearer today. She denies any chest pain or shortness of breath. -1.3 L output in the last 24 hours.  Vital Signs .    Vitals:   08/01/23 0400 08/01/23 0500 08/01/23 0600 08/01/23 0700  BP: (!) 160/54 (!) 162/52 (!) 156/51 (!) 151/60  Pulse: 86 78 72 76  Resp: 20 17 17 15   Temp:      TempSrc:      SpO2: 94% 94% 95% 94%  Weight:      Height:        Intake/Output Summary (Last 24 hours) at 08/01/2023 0743 Last data filed at 08/01/2023 0700 Gross per 24 hour  Intake 248.65 ml  Output 1550 ml  Net -1301.35 ml      07/31/2023    5:00 AM 07/30/2023    1:54 AM 07/29/2023    6:30 AM  Last 3 Weights  Weight (lbs) 149 lb 14.6 oz 149 lb 14.6 oz 149 lb 14.6 oz  Weight (kg) 68 kg 68 kg 68 kg      Telemetry/ECG    Sinus with first degree AVB with rates 60-70 - Personally Reviewed  Physical Exam .   GEN: No acute distress.   Neck: No JVD Cardiac: RRR, no murmurs, rubs, or gallops.  Respiratory: Clear to auscultation bilaterally. GI: Soft, nontender, non-distended  MS: No edema  Assessment & Plan .     NSTEMI/ CAD with prior PCI -presented with chest pain that woke her from sleep -Bp elevated -received 48 hours of IV Heparin  -d-dimer elevated with negative VQ scan -continued on ASA, statin, Plavix -beta blockers held due to bradycardia -nitroglycerin drip weaned to off and continued on Imdur 60 mg twice daily -due to age, CKD, and other comorbidities continued plan for medical management  HFimpEF/ICM -LVEF 55-60% 07/29/23 - -1.3 L output in the last 24 hours -PTA Entresto 97-103 mg bid and torsemide 20 mg daily remains on hold due to kidney function -euvolemic on exam -daily  weights, I's & O's, low sodium diet  Hypertension -blood pressure 147/56 -continued on amlodipine, hydralazine, Imdur -vital signs per unit protocol  Hyperlipidemia -LDL 51 -continue on atorvastatin   CKD stage 3-4 -serum creatinine 1.68 -continued to improve -PTA ARB and diuretic remains on hold -monitor urine output -monitor/trend/replace electrolytes as needed -daily bmp -avoid nephrotoxic agents where able  For questions or updates, please contact [facility] Please consult [email or web address] for contact info under        Signed, Shakoya Gilmore, NP

## 2023-08-01 NOTE — Progress Notes (Addendum)
1643  Report called to [facility] RN on 2 A 1750 Transferred to room 234 via bed.

## 2023-08-01 NOTE — Progress Notes (Signed)
Patient is alert and oriented to self, place, and time. BP elevated and PRN hydralazine being given, patient c/o "talking outside of room" and said "Dr. Mariah Milling told her to call out and complain next time."  I explained to her that the door had been closed tonight to help her rest and the only time staff entered was to do what had been ordered by the MD, otherwise the door would remain closed. Reassured the patient that I would close her door when I exited and I would let staff know to try and be conscious of the noise level outside of her room.

## 2023-08-02 DIAGNOSIS — I214 Non-ST elevation (NSTEMI) myocardial infarction: Secondary | ICD-10-CM | POA: Diagnosis not present

## 2023-08-02 DIAGNOSIS — R0789 Other chest pain: Secondary | ICD-10-CM | POA: Diagnosis not present

## 2023-08-02 LAB — CBC WITH DIFFERENTIAL/PLATELET
Abs Immature Granulocytes: 0.02 10*3/uL (ref 0.00–0.07)
Basophils Absolute: 0 10*3/uL (ref 0.0–0.1)
Basophils Relative: 0 %
Eosinophils Absolute: 0.2 10*3/uL (ref 0.0–0.5)
Eosinophils Relative: 2 %
HCT: 26.8 % — ABNORMAL LOW (ref 36.0–46.0)
Hemoglobin: 9.1 g/dL — ABNORMAL LOW (ref 12.0–15.0)
Immature Granulocytes: 0 %
Lymphocytes Relative: 23 %
Lymphs Abs: 1.5 10*3/uL (ref 0.7–4.0)
MCH: 31.2 pg (ref 26.0–34.0)
MCHC: 34 g/dL (ref 30.0–36.0)
MCV: 91.8 fL (ref 80.0–100.0)
Monocytes Absolute: 0.4 10*3/uL (ref 0.1–1.0)
Monocytes Relative: 6 %
Neutro Abs: 4.6 10*3/uL (ref 1.7–7.7)
Neutrophils Relative %: 69 %
Platelets: 125 10*3/uL — ABNORMAL LOW (ref 150–400)
RBC: 2.92 MIL/uL — ABNORMAL LOW (ref 3.87–5.11)
RDW: 13.4 % (ref 11.5–15.5)
WBC: 6.7 10*3/uL (ref 4.0–10.5)
nRBC: 0 % (ref 0.0–0.2)

## 2023-08-02 LAB — BASIC METABOLIC PANEL
Anion gap: 6 (ref 5–15)
BUN: 38 mg/dL — ABNORMAL HIGH (ref 8–23)
CO2: 25 mmol/L (ref 22–32)
Calcium: 8.7 mg/dL — ABNORMAL LOW (ref 8.9–10.3)
Chloride: 105 mmol/L (ref 98–111)
Creatinine, Ser: 1.54 mg/dL — ABNORMAL HIGH (ref 0.44–1.00)
GFR, Estimated: 32 mL/min — ABNORMAL LOW (ref >60)
Glucose, Bld: 91 mg/dL (ref 70–99)
Potassium: 4.3 mmol/L (ref 3.5–5.1)
Sodium: 136 mmol/L (ref 135–145)

## 2023-08-02 MED ORDER — SACUBITRIL-VALSARTAN 24-26 MG PO TABS: 1.0000 | ORAL_TABLET | Freq: Two times a day (BID) | ORAL | Status: DC

## 2023-08-02 MED ORDER — AMLODIPINE BESYLATE 5 MG PO TABS: 5.0000 mg | ORAL_TABLET | Freq: Two times a day (BID) | ORAL | 1 refills | Status: DC

## 2023-08-02 MED ORDER — ISOSORBIDE MONONITRATE ER 60 MG PO TB24: 60.0000 mg | ORAL_TABLET | Freq: Two times a day (BID) | ORAL | 1 refills | Status: DC

## 2023-08-02 MED ORDER — SACUBITRIL-VALSARTAN 24-26 MG PO TABS: 1.0000 | ORAL_TABLET | Freq: Two times a day (BID) | ORAL | 1 refills | Status: DC

## 2023-08-02 MED ORDER — POLYETHYLENE GLYCOL 3350 17 G PO PACK: 17.0000 g | PACK | Freq: Every day | ORAL | 0 refills | Status: DC | PRN

## 2023-08-02 MED ORDER — SACUBITRIL-VALSARTAN 24-26 MG PO TABS
1.0000 | ORAL_TABLET | Freq: Two times a day (BID) | ORAL | Status: DC
Start: 2023-08-02 — End: 2023-08-02
  Administered 2023-08-02: 1 via ORAL
  Filled 2023-08-02: qty 1

## 2023-08-02 NOTE — TOC Progression Note (Signed)
Transition of Care [facility]) - Progression Note    Patient Details  Name: Stephanie Charles MRN: [id number] Date of Birth: 03-09-1930  [facility]) CM/SW Contact  Truddie Hidden, RN Phone Number: 08/02/2023, 9:52 AM  Clinical Narrative:    Attempt to contact patient's daughter, no answer. Left a message.         Expected Discharge Plan and Services                                               Social Determinants of Health (SDOH) Interventions SDOH Screenings   Food Insecurity: No Food Insecurity (07/28/2023)  Housing: Low Risk  (07/28/2023)  Transportation Needs: No Transportation Needs (07/28/2023)  Utilities: Not At Risk (07/28/2023)  Alcohol Screen: Low Risk  (06/03/2022)  Depression (PHQ2-9): Low Risk  (06/03/2022)  Financial Resource Strain: Low Risk  (02/16/2022)   Received from [facility], [facility]  Physical Activity: Inactive (08/18/2020)  Social Connections: Socially Isolated (08/18/2020)  Stress: No Stress Concern Present (08/18/2020)  Tobacco Use: Low Risk  (07/28/2023)    Readmission Risk Interventions     No data to display

## 2023-08-02 NOTE — Progress Notes (Signed)
   Patient Name: Stephanie Charles Date of Encounter: 08/02/2023 [facility] Cardiologist: Julien Nordmann, MD   Interval Summary  .    Patient seen on a.m. rounds.  Sitting up in bed eating breakfast.  Denies any chest pain or shortness of breath.  Mental status has returned to baseline and she is cleaning the morning.  Blood pressure continues to be slightly elevated.  Vital Signs .    Vitals:   08/02/23 0045 08/02/23 0510 08/02/23 0742 08/02/23 0800  BP: (!) 110/55 (!) 150/61 (!) 171/68 (!) 174/61  Pulse: 76 80 71 75  Resp: 18 20    Temp: 98 F (36.7 C) 98.2 F (36.8 C) 98.7 F (37.1 C) 98.2 F (36.8 C)  TempSrc: Oral Oral    SpO2: 93% 93% 94% 93%  Weight:      Height:        Intake/Output Summary (Last 24 hours) at 08/02/2023 1024 Last data filed at 08/01/2023 1500 Gross per 24 hour  Intake --  Output 1 ml  Net -1 ml      07/31/2023    5:00 AM 07/30/2023    1:54 AM 07/29/2023    6:30 AM  Last 3 Weights  Weight (lbs) 149 lb 14.6 oz 149 lb 14.6 oz 149 lb 14.6 oz  Weight (kg) 68 kg 68 kg 68 kg      Telemetry/ECG    Sinus rhythm with AV block- Personally Reviewed  Physical Exam .   GEN: No acute distress.   Neck: No JVD Cardiac: RRR, no murmurs, rubs, or gallops.  Respiratory: Clear to auscultation bilaterally. GI: Soft, nontender, non-distended  MS: No edema  Assessment & Plan .     NSTEMI/CAD with prior PCI -Presented with chest pain that woke her from sleep with elevated blood pressure -Currently remains chest pain-free -Previously received 48 hours of IV heparin  -D-dimer elevated with negative VQ scan -Continued on aspirin, statin, clopidogrel -Beta-blockers were held due to bradycardia -Continue plan for medical management due to age, CKD, and comorbidities  HFimpEF/ICM -LVEF 55 to 60% 07/29/2023 -No accurate I's and O's documented -Appears to be euvolemic on exam -Restarted low-dose Entresto 24/26 mg twice daily -PTA torsemide  remains on hold -Daily BMP -Daily weights, I's and O's, low-sodium diet  Hypertension -Blood pressure -Continue on amlodipine, hydroxylate, Imdur -Restarted low-dose Entresto 24/26 mg twice daily -Vital signs per unit protocol  Hyperlipidemia -LDL 51 -Continued on atorvastatin  CKD stage III-IV -Serum creatinine -Continues to improve -Monitor urine output -Monitor/trend/replete electrolytes as needed -Daily BMP -Avoid nephrotoxic agents were able For questions or updates, please contact [facility] Please consult [email or web address] for contact info under        Signed, Malli Falotico, NP  e

## 2023-08-02 NOTE — TOC Transition Note (Signed)
Transition of Care [facility]) - CM/SW Discharge Note   Patient Details  Name: Stephanie Charles MRN: [id number] Date of Birth: 1930-06-29  Transition of [facility]) CM/SW Contact:  Truddie Hidden, RN Phone Number: 08/02/2023, 12:44 PM   Clinical Narrative:    Spoke with patient regarding discharge plan for today and therapy's recommendation. She is agreeable to HHPT/OT and does not have a preference of an agency. Spoke with patient's daughter's Gavin Pound and Ander Slade to advised of HH recommendation. HH PT and does not have a choice of an agency. Patient advised the accepting agency will contact her directly to scheduled SOC within 48 post discharge.  Referral sent to [facility] from [place].   TOC signing off.           Patient Goals and CMS Choice      Discharge Placement                         Discharge Plan and Services Additional resources added to the After Visit Summary for                                       Social Determinants of Health (SDOH) Interventions SDOH Screenings   Food Insecurity: No Food Insecurity (07/28/2023)  Housing: Low Risk  (07/28/2023)  Transportation Needs: No Transportation Needs (07/28/2023)  Utilities: Not At Risk (07/28/2023)  Alcohol Screen: Low Risk  (06/03/2022)  Depression (PHQ2-9): Low Risk  (06/03/2022)  Financial Resource Strain: Low Risk  (02/16/2022)   Received from [facility], [facility]  Physical Activity: Inactive (08/18/2020)  Social Connections: Socially Isolated (08/18/2020)  Stress: No Stress Concern Present (08/18/2020)  Tobacco Use: Low Risk  (07/28/2023)     Readmission Risk Interventions     No data to display

## 2023-08-02 NOTE — Care Management Important Message (Signed)
Important Message  Patient Details  Name: Stephanie Charles MRN: [id number] Date of Birth: [birth year]   Important Message Given:  Yes - Medicare IM     Olegario Messier A Vedika Dumlao 08/02/2023, 11:02 AM

## 2023-08-02 NOTE — Discharge Summary (Signed)
Effort: Pulmonary effort is normal. No respiratory distress.  Abdominal:     General: Bowel sounds are normal. There is no distension.     Palpations: Abdomen is soft.     Tenderness: There is no abdominal tenderness. There is no guarding.  Musculoskeletal:        General: No deformity.     Right lower leg: No edema.     Left lower leg: No edema.  Skin:    General: Skin is warm and dry.  Neurological:     General: No focal deficit present.  Psychiatric:        Mood and Affect: Mood normal.        Behavior: Behavior normal.   Condition at discharge: good  The results of significant diagnostics from this hospitalization (including imaging, microbiology, ancillary and laboratory) are listed below for reference.   Imaging Studies: ECHOCARDIOGRAM COMPLETE  Result Date: 07/29/2023    ECHOCARDIOGRAM REPORT   Patient Name:   Stephanie Charles Date of Exam: 07/29/2023 Medical Rec #:  [id number]      Height:       62.0 in Accession #:    [id number]     Weight:       149.9 lb Date of Birth:  June 06, 1930     BSA:          1.691 m Patient Age:    89 years       BP:           158/54 mmHg Patient Gender: F              HR:            63 bpm. Exam Location:  [facility] Procedure: 2D Echo, Color Doppler and Cardiac Doppler Indications:     Chest pain R07.9  History:         Patient has prior history of Echocardiogram examinations, most                  recent 10/07/2000. CHF, Previous Myocardial Infarction; Risk                  Factors:Hypertension and Dyslipidemia.  Sonographer:     Cristela Blue Referring Phys:  [id number] CADENCE H FURTH Diagnosing Phys: Debbe Odea MD IMPRESSIONS  1. Left ventricular ejection fraction, by estimation, is 55 to 60%. The left ventricle has normal function. The left ventricle has no regional wall motion abnormalities. There is mild left ventricular hypertrophy. Left ventricular diastolic parameters are indeterminate.  2. Right ventricular systolic function is normal. The right ventricular size is normal.  3. Left atrial size was moderately dilated.  4. The mitral valve is normal in structure. Mild mitral valve regurgitation.  5. The aortic valve was not well visualized. Aortic valve regurgitation is trivial. FINDINGS  Left Ventricle: Left ventricular ejection fraction, by estimation, is 55 to 60%. The left ventricle has normal function. The left ventricle has no regional wall motion abnormalities. The left ventricular internal cavity size was normal in size. There is  mild left ventricular hypertrophy. Left ventricular diastolic parameters are indeterminate. Right Ventricle: The right ventricular size is normal. No increase in right ventricular wall thickness. Right ventricular systolic function is normal. Left Atrium: Left atrial size was moderately dilated. Right Atrium: Right atrial size was normal in size. Pericardium: There is no evidence of pericardial effusion. Mitral Valve: The mitral valve is normal in structure. Mild mitral valve regurgitation. MV peak gradient, 3.7 mmHg. The mean mitral  Effort: Pulmonary effort is normal. No respiratory distress.  Abdominal:     General: Bowel sounds are normal. There is no distension.     Palpations: Abdomen is soft.     Tenderness: There is no abdominal tenderness. There is no guarding.  Musculoskeletal:        General: No deformity.     Right lower leg: No edema.     Left lower leg: No edema.  Skin:    General: Skin is warm and dry.  Neurological:     General: No focal deficit present.  Psychiatric:        Mood and Affect: Mood normal.        Behavior: Behavior normal.   Condition at discharge: good  The results of significant diagnostics from this hospitalization (including imaging, microbiology, ancillary and laboratory) are listed below for reference.   Imaging Studies: ECHOCARDIOGRAM COMPLETE  Result Date: 07/29/2023    ECHOCARDIOGRAM REPORT   Patient Name:   Stephanie Charles Date of Exam: 07/29/2023 Medical Rec #:  [id number]      Height:       62.0 in Accession #:    [id number]     Weight:       149.9 lb Date of Birth:  June 06, 1930     BSA:          1.691 m Patient Age:    89 years       BP:           158/54 mmHg Patient Gender: F              HR:            63 bpm. Exam Location:  [facility] Procedure: 2D Echo, Color Doppler and Cardiac Doppler Indications:     Chest pain R07.9  History:         Patient has prior history of Echocardiogram examinations, most                  recent 10/07/2000. CHF, Previous Myocardial Infarction; Risk                  Factors:Hypertension and Dyslipidemia.  Sonographer:     Cristela Blue Referring Phys:  [id number] CADENCE H FURTH Diagnosing Phys: Debbe Odea MD IMPRESSIONS  1. Left ventricular ejection fraction, by estimation, is 55 to 60%. The left ventricle has normal function. The left ventricle has no regional wall motion abnormalities. There is mild left ventricular hypertrophy. Left ventricular diastolic parameters are indeterminate.  2. Right ventricular systolic function is normal. The right ventricular size is normal.  3. Left atrial size was moderately dilated.  4. The mitral valve is normal in structure. Mild mitral valve regurgitation.  5. The aortic valve was not well visualized. Aortic valve regurgitation is trivial. FINDINGS  Left Ventricle: Left ventricular ejection fraction, by estimation, is 55 to 60%. The left ventricle has normal function. The left ventricle has no regional wall motion abnormalities. The left ventricular internal cavity size was normal in size. There is  mild left ventricular hypertrophy. Left ventricular diastolic parameters are indeterminate. Right Ventricle: The right ventricular size is normal. No increase in right ventricular wall thickness. Right ventricular systolic function is normal. Left Atrium: Left atrial size was moderately dilated. Right Atrium: Right atrial size was normal in size. Pericardium: There is no evidence of pericardial effusion. Mitral Valve: The mitral valve is normal in structure. Mild mitral valve regurgitation. MV peak gradient, 3.7 mmHg. The mean mitral  Physician Discharge Summary   Patient: Stephanie Charles MRN: [id number] DOB: 1930/08/22  Admit date:     07/28/2023  Discharge date: 08/02/23  Discharge Physician: Loyce Dys   PCP: Bosie Clos, MD   Recommendations at discharge:  Follow-up with cardiology  Discharge Diagnoses: NSTEMI (non-ST elevated myocardial infarction) (HCC) HFrEF (heart failure with reduced ejection fraction) (HCC) CAD (coronary artery disease) Acute metabolic encephalopathy Urinary tract infection Chronic kidney disease, stage 4 (severe) (HCC) Essential hypertension Bilateral leg paresthesia Adult hypothyroidism  Hospital Course:  ZYERIA HAYRE is a 89 y.o. female with medical history significant of Coronary artery disease s/p PCI with DES to proximal/mid LAD, HFrEF with last EF of 35-40% 2/2 ischemic cardiomyopathy, CKD stage IIIb, hyperlipidemia, hypertension, hypothyroidism, TIA, anemia, who presents to the ED due to chest pain. Ms. Zalazar states that she was in her usual state of health  when she awoke with sudden onset chest pain that was central and radiating straight back to her back.   ED course: On arrival to the ED, patient was hypertensive at 198/82 with heart rate of 74.  She is saturating at 97% on room air she was afebrile at 97.7.  Initial workup notable for hemoglobin of 10.5, platelets of 134, glucose 120, BUN 45, creatinine 1.91 with GFR of 25.  BNP elevated at 407, troponin 57 and then 66.  D-dimer elevated at 1.73.  INR 1.6.  Bilateral lower extremity duplex studies ordered and CT chest.  Cardiology consulted; recommending echocardiogram and heparin infusion.  TRH contacted for admission. During admission D-dimer was elevated, patient underwent VQ scan as we were unable to obtain CT angio of the chest due to increased renal function.  VQ scan did not show any pulmonary embolism.  Echocardiogram was done which showed EF 50 to 55% with indeterminate diastolic parameters.  Cardiologist  saw patient and was weaned off heparin drip after 48 hours as well as nitro drip.  Blood pressure was elevated however later became controlled.  Given patient's age and comorbid medical conditions cardiologist deemed her not a candidate for invasive intervention.  Cardiology therefore recommended medical management and has been cleared for discharge today to follow-up as an outpatient.   Consultants: Cardiology Procedures performed: As shown above Disposition: Home health Diet recommendation:  Cardiac diet DISCHARGE MEDICATION: Allergies as of 08/02/2023       Reactions   Codeine Nausea And Vomiting   Levofloxacin Other (See Comments)   Mouth sores   Lisinopril Other (See Comments)   Cough?   Other Itching   Povidone-iodine Other (See Comments), Rash   Severe blistering and itchiness. Severe redness   Simvastatin Other (See Comments)   Myalgias   Latex Itching        Medication List     STOP taking these medications    allopurinol 300 MG tablet Commonly known as: ZYLOPRIM   carvedilol 12.5 MG tablet Commonly known as: COREG   fidaxomicin 200 MG Tabs tablet Commonly known as: DIFICID   fluticasone 50 MCG/ACT nasal spray Commonly known as: FLONASE   potassium chloride 10 MEQ tablet Commonly known as: KLOR-CON   sacubitril-valsartan 97-103 MG Commonly known as: ENTRESTO Replaced by: sacubitril-valsartan 24-26 MG   torsemide 20 MG tablet Commonly known as: DEMADEX       TAKE these medications    albuterol (2.5 MG/3ML) 0.083% nebulizer solution Commonly known as: PROVENTIL USE 1 VIAL IN NEBULIZER EVERY 4 HOURS AS NEEDED   amitriptyline 10 MG tablet Commonly known  Effort: Pulmonary effort is normal. No respiratory distress.  Abdominal:     General: Bowel sounds are normal. There is no distension.     Palpations: Abdomen is soft.     Tenderness: There is no abdominal tenderness. There is no guarding.  Musculoskeletal:        General: No deformity.     Right lower leg: No edema.     Left lower leg: No edema.  Skin:    General: Skin is warm and dry.  Neurological:     General: No focal deficit present.  Psychiatric:        Mood and Affect: Mood normal.        Behavior: Behavior normal.   Condition at discharge: good  The results of significant diagnostics from this hospitalization (including imaging, microbiology, ancillary and laboratory) are listed below for reference.   Imaging Studies: ECHOCARDIOGRAM COMPLETE  Result Date: 07/29/2023    ECHOCARDIOGRAM REPORT   Patient Name:   Stephanie Charles Date of Exam: 07/29/2023 Medical Rec #:  [id number]      Height:       62.0 in Accession #:    [id number]     Weight:       149.9 lb Date of Birth:  June 06, 1930     BSA:          1.691 m Patient Age:    89 years       BP:           158/54 mmHg Patient Gender: F              HR:            63 bpm. Exam Location:  [facility] Procedure: 2D Echo, Color Doppler and Cardiac Doppler Indications:     Chest pain R07.9  History:         Patient has prior history of Echocardiogram examinations, most                  recent 10/07/2000. CHF, Previous Myocardial Infarction; Risk                  Factors:Hypertension and Dyslipidemia.  Sonographer:     Cristela Blue Referring Phys:  [id number] CADENCE H FURTH Diagnosing Phys: Debbe Odea MD IMPRESSIONS  1. Left ventricular ejection fraction, by estimation, is 55 to 60%. The left ventricle has normal function. The left ventricle has no regional wall motion abnormalities. There is mild left ventricular hypertrophy. Left ventricular diastolic parameters are indeterminate.  2. Right ventricular systolic function is normal. The right ventricular size is normal.  3. Left atrial size was moderately dilated.  4. The mitral valve is normal in structure. Mild mitral valve regurgitation.  5. The aortic valve was not well visualized. Aortic valve regurgitation is trivial. FINDINGS  Left Ventricle: Left ventricular ejection fraction, by estimation, is 55 to 60%. The left ventricle has normal function. The left ventricle has no regional wall motion abnormalities. The left ventricular internal cavity size was normal in size. There is  mild left ventricular hypertrophy. Left ventricular diastolic parameters are indeterminate. Right Ventricle: The right ventricular size is normal. No increase in right ventricular wall thickness. Right ventricular systolic function is normal. Left Atrium: Left atrial size was moderately dilated. Right Atrium: Right atrial size was normal in size. Pericardium: There is no evidence of pericardial effusion. Mitral Valve: The mitral valve is normal in structure. Mild mitral valve regurgitation. MV peak gradient, 3.7 mmHg. The mean mitral  Effort: Pulmonary effort is normal. No respiratory distress.  Abdominal:     General: Bowel sounds are normal. There is no distension.     Palpations: Abdomen is soft.     Tenderness: There is no abdominal tenderness. There is no guarding.  Musculoskeletal:        General: No deformity.     Right lower leg: No edema.     Left lower leg: No edema.  Skin:    General: Skin is warm and dry.  Neurological:     General: No focal deficit present.  Psychiatric:        Mood and Affect: Mood normal.        Behavior: Behavior normal.   Condition at discharge: good  The results of significant diagnostics from this hospitalization (including imaging, microbiology, ancillary and laboratory) are listed below for reference.   Imaging Studies: ECHOCARDIOGRAM COMPLETE  Result Date: 07/29/2023    ECHOCARDIOGRAM REPORT   Patient Name:   Stephanie Charles Date of Exam: 07/29/2023 Medical Rec #:  [id number]      Height:       62.0 in Accession #:    [id number]     Weight:       149.9 lb Date of Birth:  June 06, 1930     BSA:          1.691 m Patient Age:    89 years       BP:           158/54 mmHg Patient Gender: F              HR:            63 bpm. Exam Location:  [facility] Procedure: 2D Echo, Color Doppler and Cardiac Doppler Indications:     Chest pain R07.9  History:         Patient has prior history of Echocardiogram examinations, most                  recent 10/07/2000. CHF, Previous Myocardial Infarction; Risk                  Factors:Hypertension and Dyslipidemia.  Sonographer:     Cristela Blue Referring Phys:  [id number] CADENCE H FURTH Diagnosing Phys: Debbe Odea MD IMPRESSIONS  1. Left ventricular ejection fraction, by estimation, is 55 to 60%. The left ventricle has normal function. The left ventricle has no regional wall motion abnormalities. There is mild left ventricular hypertrophy. Left ventricular diastolic parameters are indeterminate.  2. Right ventricular systolic function is normal. The right ventricular size is normal.  3. Left atrial size was moderately dilated.  4. The mitral valve is normal in structure. Mild mitral valve regurgitation.  5. The aortic valve was not well visualized. Aortic valve regurgitation is trivial. FINDINGS  Left Ventricle: Left ventricular ejection fraction, by estimation, is 55 to 60%. The left ventricle has normal function. The left ventricle has no regional wall motion abnormalities. The left ventricular internal cavity size was normal in size. There is  mild left ventricular hypertrophy. Left ventricular diastolic parameters are indeterminate. Right Ventricle: The right ventricular size is normal. No increase in right ventricular wall thickness. Right ventricular systolic function is normal. Left Atrium: Left atrial size was moderately dilated. Right Atrium: Right atrial size was normal in size. Pericardium: There is no evidence of pericardial effusion. Mitral Valve: The mitral valve is normal in structure. Mild mitral valve regurgitation. MV peak gradient, 3.7 mmHg. The mean mitral  Physician Discharge Summary   Patient: Stephanie Charles MRN: [id number] DOB: 1930/08/22  Admit date:     07/28/2023  Discharge date: 08/02/23  Discharge Physician: Loyce Dys   PCP: Bosie Clos, MD   Recommendations at discharge:  Follow-up with cardiology  Discharge Diagnoses: NSTEMI (non-ST elevated myocardial infarction) (HCC) HFrEF (heart failure with reduced ejection fraction) (HCC) CAD (coronary artery disease) Acute metabolic encephalopathy Urinary tract infection Chronic kidney disease, stage 4 (severe) (HCC) Essential hypertension Bilateral leg paresthesia Adult hypothyroidism  Hospital Course:  ZYERIA HAYRE is a 89 y.o. female with medical history significant of Coronary artery disease s/p PCI with DES to proximal/mid LAD, HFrEF with last EF of 35-40% 2/2 ischemic cardiomyopathy, CKD stage IIIb, hyperlipidemia, hypertension, hypothyroidism, TIA, anemia, who presents to the ED due to chest pain. Ms. Zalazar states that she was in her usual state of health  when she awoke with sudden onset chest pain that was central and radiating straight back to her back.   ED course: On arrival to the ED, patient was hypertensive at 198/82 with heart rate of 74.  She is saturating at 97% on room air she was afebrile at 97.7.  Initial workup notable for hemoglobin of 10.5, platelets of 134, glucose 120, BUN 45, creatinine 1.91 with GFR of 25.  BNP elevated at 407, troponin 57 and then 66.  D-dimer elevated at 1.73.  INR 1.6.  Bilateral lower extremity duplex studies ordered and CT chest.  Cardiology consulted; recommending echocardiogram and heparin infusion.  TRH contacted for admission. During admission D-dimer was elevated, patient underwent VQ scan as we were unable to obtain CT angio of the chest due to increased renal function.  VQ scan did not show any pulmonary embolism.  Echocardiogram was done which showed EF 50 to 55% with indeterminate diastolic parameters.  Cardiologist  saw patient and was weaned off heparin drip after 48 hours as well as nitro drip.  Blood pressure was elevated however later became controlled.  Given patient's age and comorbid medical conditions cardiologist deemed her not a candidate for invasive intervention.  Cardiology therefore recommended medical management and has been cleared for discharge today to follow-up as an outpatient.   Consultants: Cardiology Procedures performed: As shown above Disposition: Home health Diet recommendation:  Cardiac diet DISCHARGE MEDICATION: Allergies as of 08/02/2023       Reactions   Codeine Nausea And Vomiting   Levofloxacin Other (See Comments)   Mouth sores   Lisinopril Other (See Comments)   Cough?   Other Itching   Povidone-iodine Other (See Comments), Rash   Severe blistering and itchiness. Severe redness   Simvastatin Other (See Comments)   Myalgias   Latex Itching        Medication List     STOP taking these medications    allopurinol 300 MG tablet Commonly known as: ZYLOPRIM   carvedilol 12.5 MG tablet Commonly known as: COREG   fidaxomicin 200 MG Tabs tablet Commonly known as: DIFICID   fluticasone 50 MCG/ACT nasal spray Commonly known as: FLONASE   potassium chloride 10 MEQ tablet Commonly known as: KLOR-CON   sacubitril-valsartan 97-103 MG Commonly known as: ENTRESTO Replaced by: sacubitril-valsartan 24-26 MG   torsemide 20 MG tablet Commonly known as: DEMADEX       TAKE these medications    albuterol (2.5 MG/3ML) 0.083% nebulizer solution Commonly known as: PROVENTIL USE 1 VIAL IN NEBULIZER EVERY 4 HOURS AS NEEDED   amitriptyline 10 MG tablet Commonly known

## 2023-08-02 NOTE — Progress Notes (Signed)
Patient is not able to walk the distance required to go the bathroom, or he/she is unable to safely negotiate stairs required to access the bathroom.  A BSC will alleviate this problem.

## 2023-08-02 NOTE — Consult Note (Signed)
[facility] Liaison Note  08/02/2023  Stephanie Charles September 12, 1930 [id number]  Location: screened the patient remotely at [facility] ED.  Insurance: [employer] Stephanie Charles is a 89 y.o. female who is a Primary Care Patient of Bosie Clos, MD-[facility].The patient was screened for readmission hospitalization with noted medium risk score for unplanned readmission risk with 1 IP in 6 months.  The patient was assessed for potential Care Management service needs for post hospital transition for care coordination. Review of patient's electronic medical record reveals patient was admitted for chest pain. Provider's office will provided the transition of care follow up and care management needs for this pt.    [facility] Care Management/Population Health does not replace or interfere with any arrangements made by the Inpatient Transition of Care team.   For questions contact:   Elliot Cousin, RN, [facility] Liaison [place]   [facility], Population Health Office Hours MTWF  8:00 am-6:00 pm Direct Dial: [phone] mobile [phone] [Office toll free line] Office Hours are M-F 8:30 - 5 pm [email or web address]

## 2023-08-03 ENCOUNTER — Telehealth: Payer: Self-pay | Admitting: Cardiovascular Disease

## 2023-08-03 NOTE — Telephone Encounter (Signed)
Pt c/o medication issue:  1. Name of Medication: Allopurinolol  2. How are you currently taking this medication (dosage and times per day)? As directed  3. Are you having a reaction (difficulty breathing--STAT)? No  4. What is your medication issue? Pt states she was taken off several medication recently in the hosp and states that allopurinolol was one of them. States that she needs this for her gout and wants to know if ok to still take. Requesting cb

## 2023-08-04 ENCOUNTER — Telehealth: Payer: Self-pay | Admitting: Cardiovascular Disease

## 2023-08-04 DIAGNOSIS — G9341 Metabolic encephalopathy: Secondary | ICD-10-CM | POA: Diagnosis not present

## 2023-08-04 DIAGNOSIS — R202 Paresthesia of skin: Secondary | ICD-10-CM | POA: Diagnosis not present

## 2023-08-04 DIAGNOSIS — I2510 Atherosclerotic heart disease of native coronary artery without angina pectoris: Secondary | ICD-10-CM | POA: Diagnosis not present

## 2023-08-04 DIAGNOSIS — I5020 Unspecified systolic (congestive) heart failure: Secondary | ICD-10-CM | POA: Diagnosis not present

## 2023-08-04 DIAGNOSIS — J439 Emphysema, unspecified: Secondary | ICD-10-CM | POA: Diagnosis not present

## 2023-08-04 DIAGNOSIS — E785 Hyperlipidemia, unspecified: Secondary | ICD-10-CM | POA: Diagnosis not present

## 2023-08-04 DIAGNOSIS — Z955 Presence of coronary angioplasty implant and graft: Secondary | ICD-10-CM | POA: Diagnosis not present

## 2023-08-04 DIAGNOSIS — I255 Ischemic cardiomyopathy: Secondary | ICD-10-CM | POA: Diagnosis not present

## 2023-08-04 DIAGNOSIS — I214 Non-ST elevation (NSTEMI) myocardial infarction: Secondary | ICD-10-CM | POA: Diagnosis not present

## 2023-08-04 DIAGNOSIS — N390 Urinary tract infection, site not specified: Secondary | ICD-10-CM | POA: Diagnosis not present

## 2023-08-04 DIAGNOSIS — D631 Anemia in chronic kidney disease: Secondary | ICD-10-CM | POA: Diagnosis not present

## 2023-08-04 DIAGNOSIS — N184 Chronic kidney disease, stage 4 (severe): Secondary | ICD-10-CM | POA: Diagnosis not present

## 2023-08-04 DIAGNOSIS — I272 Pulmonary hypertension, unspecified: Secondary | ICD-10-CM | POA: Diagnosis not present

## 2023-08-04 DIAGNOSIS — I080 Rheumatic disorders of both mitral and aortic valves: Secondary | ICD-10-CM | POA: Diagnosis not present

## 2023-08-04 DIAGNOSIS — I5022 Chronic systolic (congestive) heart failure: Secondary | ICD-10-CM | POA: Diagnosis not present

## 2023-08-04 DIAGNOSIS — I288 Other diseases of pulmonary vessels: Secondary | ICD-10-CM | POA: Diagnosis not present

## 2023-08-04 DIAGNOSIS — Z7982 Long term (current) use of aspirin: Secondary | ICD-10-CM | POA: Diagnosis not present

## 2023-08-04 DIAGNOSIS — Z7902 Long term (current) use of antithrombotics/antiplatelets: Secondary | ICD-10-CM | POA: Diagnosis not present

## 2023-08-04 DIAGNOSIS — I130 Hypertensive heart and chronic kidney disease with heart failure and stage 1 through stage 4 chronic kidney disease, or unspecified chronic kidney disease: Secondary | ICD-10-CM | POA: Diagnosis not present

## 2023-08-04 DIAGNOSIS — E039 Hypothyroidism, unspecified: Secondary | ICD-10-CM | POA: Diagnosis not present

## 2023-08-04 DIAGNOSIS — I700 Atherosclerosis of aorta: Secondary | ICD-10-CM | POA: Diagnosis not present

## 2023-08-04 DIAGNOSIS — Z8673 Personal history of transient ischemic attack (TIA), and cerebral infarction without residual deficits: Secondary | ICD-10-CM | POA: Diagnosis not present

## 2023-08-04 NOTE — Telephone Encounter (Signed)
Patient calling to see if appt on 11/8 can be virtual. Please advise

## 2023-08-05 NOTE — Progress Notes (Deleted)
Cardiology Office Note    Date:  08/05/2023   ID:  RAIDEN YEARWOOD, DOB Jun 18, 1930, MRN [id number]  PCP:  Bosie Clos, MD  Cardiologist:  Julien Nordmann, MD  Electrophysiologist:  None   Chief Complaint: ***  History of Present Illness:   Stephanie Charles is a 89 y.o. female with history of ***  ***   Labs independently reviewed: 07/2023 - potassium 4.3, BUN 38, serum creatinine 1.54, Hgb 9.1, PLT 125, albumin 3.3, AST/ALT normal, magnesium 2.0, TC 90, TG 49, HDL 29, LDL 51, TSH normal  Past Medical History:  Diagnosis Date   Anemia    C. difficile diarrhea    CHF (congestive heart failure) (HCC) 10/05/2021   Diverticulitis    GERD (gastroesophageal reflux disease)    Hyperlipidemia    Hypertension    Hypothyroidism    Myocardial infarction (HCC) 08/2017   TIA (transient ischemic attack)    1 approx 2015, 1 approx 2018   Vertigo    last episode several months ago   [name]     Past Surgical History:  Procedure Laterality Date   COLONOSCOPY WITH PROPOFOL N/A 06/22/2018   Procedure: COLONOSCOPY WITH PROPOFOL;  Surgeon: Wyline Mood, MD;  Location: [facility] ENDOSCOPY;  Service: Endoscopy;  Laterality: N/A;   CORONARY STENT INTERVENTION N/A 06/07/2019   Procedure: CORONARY STENT INTERVENTION;  Surgeon: Iran Ouch, MD;  Location: [facility] INVASIVE CV LAB;  Service: Cardiovascular;  Laterality: N/A;   ESOPHAGOGASTRODUODENOSCOPY (EGD) WITH PROPOFOL N/A 06/22/2018   Procedure: ESOPHAGOGASTRODUODENOSCOPY (EGD) WITH PROPOFOL;  Surgeon: Wyline Mood, MD;  Location: [facility] ENDOSCOPY;  Service: Endoscopy;  Laterality: N/A;   KNEE ARTHROSCOPY Right    REPLACEMENT TOTAL KNEE BILATERAL     RIGHT/LEFT HEART CATH AND CORONARY ANGIOGRAPHY N/A 06/07/2019   Procedure: RIGHT/LEFT HEART CATH AND CORONARY ANGIOGRAPHY;  Surgeon: Antonieta Iba, MD;  Location: [facility] INVASIVE CV LAB;  Service: Cardiovascular;  Laterality: N/A;   SHOULDER SURGERY Left     Current  Medications: No outpatient medications have been marked as taking for the 08/12/23 encounter (Appointment) with Sondra Barges, PA-C.    Allergies:   Codeine, Levofloxacin, Lisinopril, Other, Povidone-iodine, Simvastatin, and Latex   Social History   Socioeconomic History   Marital status: Divorced    Spouse name: na   Number of children: 4   Years of education: 14   Highest education level: Associate degree: occupational, Scientist, product/process development, or vocational program  Occupational History   Occupation: retired  Tobacco Use   Smoking status: Never   Smokeless tobacco: Never  Vaping Use   Vaping status: Never Used  Substance and Sexual Activity   Alcohol use: No    Alcohol/week: 0.0 standard drinks of alcohol   Drug use: No   Sexual activity: Never  Other Topics Concern   Not on file  Social History Narrative   Not on file   Social Determinants of Health   Financial Resource Strain: Low Risk  (02/16/2022)   Received from [facility], [facility]   Overall Financial Resource Strain (CARDIA)    Difficulty of Paying Living Expenses: Not hard at all  Food Insecurity: No Food Insecurity (07/28/2023)   Hunger Vital Sign    Worried About Running Out of Food in the Last Year: Never true    Ran Out of Food in the Last Year: Never true  Transportation Needs: No Transportation Needs (07/28/2023)   PRAPARE - Transportation    Lack  of Transportation (Medical): No    Lack of Transportation (Non-Medical): No  Physical Activity: Inactive (08/18/2020)   Exercise Vital Sign    Days of Exercise per Week: 0 days    Minutes of Exercise per Session: 0 min  Stress: No Stress Concern Present (08/18/2020)   [employer] of Occupational Health - Occupational Stress Questionnaire    Feeling of Stress : Not at all  Social Connections: Socially Isolated (08/18/2020)   Social Connection and Isolation Panel [NHANES]    Frequency of Communication with Friends and Family: More than three times a week     Frequency of Social Gatherings with Friends and Family: More than three times a week    Attends Religious Services: Never    Database administrator or Organizations: No    Attends Engineer, structural: Never    Marital Status: Divorced     Family History:  The patient's family history includes Alzheimer's disease in her brother and brother; Breast cancer in her sister; Cancer in her brother; Clotting disorder in her daughter; Fibromyalgia in her daughter; Heart attack in her brother; Heart disease in her brother, brother, father, and mother; Hypertension in her mother; Mental illness in her brother; Stroke in her brother and mother.  ROS:   12-point review of systems is negative unless otherwise noted in the HPI.   EKGs/Labs/Other Studies Reviewed:    Studies reviewed were summarized above. The additional studies were reviewed today:  2D echo 07/29/2023: 1. Left ventricular ejection fraction, by estimation, is 55 to 60%. The  left ventricle has normal function. The left ventricle has no regional  wall motion abnormalities. There is mild left ventricular hypertrophy.  Left ventricular diastolic parameters  are indeterminate.   2. Right ventricular systolic function is normal. The right ventricular  size is normal.   3. Left atrial size was moderately dilated.   4. The mitral valve is normal in structure. Mild mitral valve  regurgitation.   5. The aortic valve was not well visualized. Aortic valve regurgitation  is trivial.  __________  Carotid artery ultrasound 12/28/2022: Summary:  Right Carotid: Velocities in the right ICA are consistent with a 1-39%  stenosis.  Non-hemodynamically significant plaque <50% noted in the  CCA. The ECA appears >50% stenosed. Tortuous CCA.   Left Carotid: Velocities in the left ICA are consistent with a 1-39%  stenosis.  Non-hemodynamically significant plaque <50% noted in the  CCA. The ECA appears <50% stenosed.   Vertebrals:   Bilateral vertebral arteries demonstrate antegrade flow.  Subclavians: Bilateral subclavian artery flow was disturbed.  __________   Luci Bank patch 09/2022: Normal sinus rhythm Patient had a min HR of 41 bpm, max HR of 185 bpm, and avg HR of 70 bpm.    Bundle Branch Block/IVCD was present.  1 run of Ventricular Tachycardia occurred lasting 8 beats with a max rate of 185 bpm (avg 170  bpm).    2 Supraventricular Tachycardia runs occurred, the run with the fastest interval lasting 9 beats with a max rate of 133 bpm (avg 96 bpm); the run with the fastest interval was also the longest.    Junctional Rhythm was present.    Isolated SVEs were occasional (1.9%, 23596), SVE Couplets were rare (<1.0%, 1783), and SVE Triplets were rare (<1.0%, 296).    Isolated VEs were occasional (3.4%, 42235), VE Couplets were rare (<1.0%, 1465), and VE Triplets were rare (<1.0%, 1). Ventricular Bigeminy and Trigeminy were present.   No patient triggered  events recorded __________   2D echo 10/07/2021: 1. Frequent PVCs during study.   2. Difficult to determine LVEF due to very frequent ectopy and limited  echo windows. Based on images with no ectopy, left ventricular ejection  fraction, by estimation, is 40%. The left ventricle has  mildly-to-moderately decreased function. The left  ventricle demonstrates global hypokinesis. There is paradoxical septal  motion due to known LBBB. There is moderate concentric left ventricular  hypertrophy. Left ventricular diastolic parameters are indeterminate.   3. Right ventricular systolic function is normal. The right ventricular  size is normal.   4. Left atrial size was severely dilated.   5. The mitral valve is grossly normal. Trivial mitral valve  regurgitation.   6. The aortic valve is tricuspid. There is mild calcification of the  aortic valve. There is mild thickening of the aortic valve. Aortic valve  regurgitation is not visualized. Aortic valve  sclerosis/calcification is  present, without any evidence of  aortic stenosis.   7. The inferior vena cava is normal in size with greater than 50%  respiratory variability, suggesting right atrial pressure of 3 mmHg.  __________   2D echo 03/07/2021: 1. Left ventricular ejection fraction, by estimation, is 50 to 55%. The  left ventricle has low normal function. Left ventricular endocardial  border not optimally defined to evaluate regional wall motion. There is  moderate left ventricular hypertrophy.  Indeterminate diastolic filling due to E-A fusion.   2. Right ventricular systolic function is normal. The right ventricular  size is normal. Tricuspid regurgitation signal is inadequate for assessing  PA pressure.   3. Left atrial size was moderately dilated.   4. The mitral valve is abnormal. No evidence of mitral valve  regurgitation. No evidence of mitral stenosis.   5. The aortic valve has an indeterminant number of cusps. There is mild  thickening of the aortic valve. Aortic valve regurgitation is not  visualized. Mild aortic valve sclerosis is present, with no evidence of  aortic valve stenosis. __________   Carotid artery ultrasound 07/2020: Summary:  Right Carotid: Velocities in the right ICA are consistent with a 1-39%  stenosis.  Non-hemodynamically significant plaque <50% noted in the  CCA.  The ECA appears >50% stenosed.   Left Carotid: Velocities in the left ICA are consistent with a 1-39%  stenosis.  Non-hemodynamically significant plaque <50% noted in the  CCA.  The ECA appears <50% stenosed.   Vertebrals:  Bilateral vertebral arteries demonstrate antegrade flow.  Subclavians: Left subclavian artery was stenotic. Normal flow hemodynamics  were seen in the right subclavian artery.  __________   2D echo 09/2019: 1. Left ventricular ejection fraction, by visual estimation, is 40 to  45%. The left ventricle has moderately decreased function. There is  moderately  increased left ventricular hypertrophy.   2. Abnormal septal motion consistent with left bundle branch block.   3. Left ventricular diastolic parameters are consistent with Grade I  diastolic dysfunction (impaired relaxation).   4. The left ventricle demonstrates global hypokinesis.   5. Global right ventricle has moderately reduced systolic function.The  right ventricular size is normal. Mildly increased right ventricular wall  thickness.   6. Left atrial size was moderately dilated.   7. Right atrial size was not well visualized.   8. Presence of pericardial fat pad.   9. The mitral valve was not well visualized. Trivial mitral valve  regurgitation. No evidence of mitral stenosis.  10. The tricuspid valve is not well visualized. Tricuspid valve  regurgitation is not demonstrated.  11. The aortic valve is tricuspid. Aortic valve regurgitation is not  visualized. No evidence of aortic valve sclerosis or stenosis.  12. Pulmonic regurgitation is mild.  13. The pulmonic valve was not well visualized. Pulmonic valve  regurgitation is mild.  14. TR signal is inadequate for assessing pulmonary artery systolic  pressure.  15. The inferior vena cava is dilated in size with >50% respiratory  variability, suggesting right atrial pressure of 8 mmHg.  16. The interatrial septum was not well visualized. __________   [facility] with PCI in 06/07/2019: Mid LAD lesion is 95% stenosed. Post intervention, there is a 0% residual stenosis. A drug-eluting stent was successfully placed using a STENT RESOLUTE ONYX 2.5X22. 2nd Diag lesion is 30% stenosed.   Successful angioplasty and drug-eluting stent placement to the mid LAD.   Recommendations: Dual antiplatelet therapy for at least 1 year. Blood pressure control and cardiac rehab.  Continue treatment of risk factors.       Coronary angiography:  Coronary dominance: Right  Left mainstem:  Large vessel that bifurcates into the LAD and left circumflex,  no significant disease noted  Left anterior descending (LAD):   Large vessel that extends to the apical region, diagonal branch 2 of moderate size, proximal to mid LAD prior to large diagonal with 90% stenosis, hazy, heavily calcified.  Left circumflex (LCx):  Large vessel with OM branch 2, no significant disease noted, mild to moderate proximal to mid disease estimated 40%  Right coronary artery (RCA):  Right dominant vessel with PL and PDA, no significant disease noted, very mild proximal disease estimated 30%  Left ventriculography: Aortic valve crossed for pressures, no significant there is no significant mitral regurgitation , no significant aortic valve stenosis Frequent ectopy noted  RA pressures mean of 8 RV pressure 32/7 mean 9 PA pressure 31/16 mean 21 Wedge pressure 10 Cardiac output 3.57 Cardiac index 2.02  Final Conclusions:   Culprit vessel for presentation is the proximal to mid LAD estimated at 90% Case discussed with Dr. Kirke Corin, he will attempt PCI   Recommendations:  pci as above Cardiomyopathy meds, will make changes __________   2D echo 06/2019: 1. The left ventricle has moderately reduced systolic function, with an  ejection fraction of 35-40%. The cavity size was normal. There is  moderately increased left ventricular wall thickness. Left ventricular  diastolic Doppler parameters are  indeterminate. Left ventricular diffuse hypokinesis.   2. The right ventricle has normal systolic function. The cavity was  normal. There is no increase in right ventricular wall thickness.Unable to  estimate RVSP   3. Frequent PVCs.   4. Left atrial size was moderately dilated.   EKG:  EKG is ordered today.  The EKG ordered today demonstrates ***  Recent Labs: 07/28/2023: B Natriuretic Peptide 407.5; Magnesium 2.0; TSH 0.549 07/29/2023: ALT 13 08/02/2023: BUN 38; Creatinine, Ser 1.54; Hemoglobin 9.1; Platelets 125; Potassium 4.3; Sodium 136  Recent Lipid Panel     Component Value Date/Time   CHOL 90 07/28/2023 1628   CHOL 115 12/15/2021 1014   CHOL 120 10/19/2011 0323   TRIG 49 07/28/2023 1628   TRIG 152 10/19/2011 0323   HDL 29 (L) 07/28/2023 1628   HDL 29 (L) 12/15/2021 1014   HDL 15 (L) 10/19/2011 0323   CHOLHDL 3.1 07/28/2023 1628   VLDL 10 07/28/2023 1628   VLDL 30 10/19/2011 0323   LDLCALC 51 07/28/2023 1628   LDLCALC 53 12/15/2021 1014   LDLCALC 75 10/19/2011  4782    PHYSICAL EXAM:    VS:  There were no vitals taken for this visit.  BMI: There is no height or weight on file to calculate BMI.  Physical Exam  Wt Readings from Last 3 Encounters:  07/31/23 149 lb 14.6 oz (68 kg)  06/21/23 142 lb (64.4 kg)  02/09/23 147 lb 9.6 oz (67 kg)     ASSESSMENT & PLAN:   ***   {Are you ordering a CV Procedure (e.g. stress test, cath, DCCV, TEE, etc)?   Press F2        :[id number]}     Disposition: F/u with Dr. Mariah Milling or an APP in ***.   Medication Adjustments/Labs and Tests Ordered: Current medicines are reviewed at length with the patient today.  Concerns regarding medicines are outlined above. Medication changes, Labs and Tests ordered today are summarized above and listed in the Patient Instructions accessible in Encounters.   Signed, Eula Listen, PA-C 08/05/2023 4:05 PM     [facility] [address] [place], [place] [address] [phone]

## 2023-08-05 NOTE — Telephone Encounter (Signed)
Called and spoke with patient. Notified her of the following from Dr. Mariah Milling.   it appears the allopurinol was stopped by the hospitalist at discharge Details were stopping the medication were not defined My suspicion is that it could be for chronic renal insufficiency I would recommend cutting the pill in half, 300 mg pill down to 150 daily And talk with Dr. Sullivan Lone When creatinine clearance is low in the setting of renal dysfunction, dosing of allopurinol often gets decreased Thx TGollan    Patient verbalizes understanding.

## 2023-08-05 NOTE — Telephone Encounter (Signed)
Virtual for her in this case is okay due to advanced age and significant comorbidities.  We will need to arrange follow-up labs at that time though.

## 2023-08-08 NOTE — Telephone Encounter (Signed)
The patient has been called to verify virtual appt approved by Alycia Rossetti.  Pt reported that the time was too early for her.  Appt moved  Pt reports she has a BP cuff to provide BP and HR info

## 2023-08-10 DIAGNOSIS — R202 Paresthesia of skin: Secondary | ICD-10-CM | POA: Diagnosis not present

## 2023-08-10 DIAGNOSIS — Z8673 Personal history of transient ischemic attack (TIA), and cerebral infarction without residual deficits: Secondary | ICD-10-CM | POA: Diagnosis not present

## 2023-08-10 DIAGNOSIS — D631 Anemia in chronic kidney disease: Secondary | ICD-10-CM | POA: Diagnosis not present

## 2023-08-10 DIAGNOSIS — E039 Hypothyroidism, unspecified: Secondary | ICD-10-CM | POA: Diagnosis not present

## 2023-08-10 DIAGNOSIS — Z7902 Long term (current) use of antithrombotics/antiplatelets: Secondary | ICD-10-CM | POA: Diagnosis not present

## 2023-08-10 DIAGNOSIS — I5022 Chronic systolic (congestive) heart failure: Secondary | ICD-10-CM | POA: Diagnosis not present

## 2023-08-10 DIAGNOSIS — I272 Pulmonary hypertension, unspecified: Secondary | ICD-10-CM | POA: Diagnosis not present

## 2023-08-10 DIAGNOSIS — Z7982 Long term (current) use of aspirin: Secondary | ICD-10-CM | POA: Diagnosis not present

## 2023-08-10 DIAGNOSIS — N390 Urinary tract infection, site not specified: Secondary | ICD-10-CM | POA: Diagnosis not present

## 2023-08-10 DIAGNOSIS — N184 Chronic kidney disease, stage 4 (severe): Secondary | ICD-10-CM | POA: Diagnosis not present

## 2023-08-10 DIAGNOSIS — I5020 Unspecified systolic (congestive) heart failure: Secondary | ICD-10-CM | POA: Diagnosis not present

## 2023-08-10 DIAGNOSIS — I2510 Atherosclerotic heart disease of native coronary artery without angina pectoris: Secondary | ICD-10-CM | POA: Diagnosis not present

## 2023-08-10 DIAGNOSIS — I130 Hypertensive heart and chronic kidney disease with heart failure and stage 1 through stage 4 chronic kidney disease, or unspecified chronic kidney disease: Secondary | ICD-10-CM | POA: Diagnosis not present

## 2023-08-10 DIAGNOSIS — J439 Emphysema, unspecified: Secondary | ICD-10-CM | POA: Diagnosis not present

## 2023-08-10 DIAGNOSIS — Z955 Presence of coronary angioplasty implant and graft: Secondary | ICD-10-CM | POA: Diagnosis not present

## 2023-08-10 DIAGNOSIS — I080 Rheumatic disorders of both mitral and aortic valves: Secondary | ICD-10-CM | POA: Diagnosis not present

## 2023-08-10 DIAGNOSIS — I255 Ischemic cardiomyopathy: Secondary | ICD-10-CM | POA: Diagnosis not present

## 2023-08-10 DIAGNOSIS — G9341 Metabolic encephalopathy: Secondary | ICD-10-CM | POA: Diagnosis not present

## 2023-08-10 DIAGNOSIS — E785 Hyperlipidemia, unspecified: Secondary | ICD-10-CM | POA: Diagnosis not present

## 2023-08-10 DIAGNOSIS — I288 Other diseases of pulmonary vessels: Secondary | ICD-10-CM | POA: Diagnosis not present

## 2023-08-10 DIAGNOSIS — I214 Non-ST elevation (NSTEMI) myocardial infarction: Secondary | ICD-10-CM | POA: Diagnosis not present

## 2023-08-10 DIAGNOSIS — I700 Atherosclerosis of aorta: Secondary | ICD-10-CM | POA: Diagnosis not present

## 2023-08-11 ENCOUNTER — Telehealth: Payer: Self-pay | Admitting: Cardiovascular Disease

## 2023-08-11 ENCOUNTER — Other Ambulatory Visit: Payer: Self-pay

## 2023-08-11 MED ORDER — SACUBITRIL-VALSARTAN 24-26 MG PO TABS: 1.0000 | ORAL_TABLET | Freq: Two times a day (BID) | ORAL | 1 refills | Status: DC

## 2023-08-11 NOTE — Telephone Encounter (Signed)
RX sent to [facility] as requested.

## 2023-08-11 NOTE — Telephone Encounter (Signed)
Pt c/o medication issue:  1. Name of Medication:   sacubitril-valsartan (ENTRESTO) 24-26 MG    2. How are you currently taking this medication (dosage and times per day)?   3. Are you having a reaction (difficulty breathing--STAT)?   4. What is your medication issue? Patient states that she is suppose to be receiving this medication via a mail order, but when the med was changed, it was sent to [facility] pharmacy instead.   Send to [facility].  Patient is completely out of this medication.

## 2023-08-11 NOTE — Telephone Encounter (Signed)
Called patient and spoke with her and daughter- they were advised RX was sent to pharmacy. Patient daughter states they spoke with the pharmacy and they would place the order for 1 day delivery. I offered samples, but states that they should get the medication shortly and would let me know if they did not. Patient daughter verbalized understanding, thankful for call back.

## 2023-08-12 ENCOUNTER — Ambulatory Visit: Payer: Medicare Other | Admitting: Physician Assistant

## 2023-08-12 NOTE — Progress Notes (Deleted)
Cardiology Office Note    Date:  08/12/2023   ID:  Stephanie Charles, DOB 1930-06-06, MRN [id number]  PCP:  Bosie Clos, MD  Cardiologist:  Julien Nordmann, MD  Electrophysiologist:  None   Chief Complaint: ***  History of Present Illness:   Stephanie Charles is a 89 y.o. female with history of ***  ***   Labs independently reviewed: 07/2023 - potassium 4.3, BUN 38, serum creatinine 1.54, Hgb 9.1, PLT 125, albumin 3.3, AST/ALT normal, magnesium 2.0, TC 90, TG 49, HDL 29, LDL 51, TSH normal  Past Medical History:  Diagnosis Date   Anemia    C. difficile diarrhea    CHF (congestive heart failure) (HCC) 10/05/2021   Diverticulitis    GERD (gastroesophageal reflux disease)    Hyperlipidemia    Hypertension    Hypothyroidism    Myocardial infarction (HCC) 08/2017   TIA (transient ischemic attack)    1 approx 2015, 1 approx 2018   Vertigo    last episode several months ago   [name]     Past Surgical History:  Procedure Laterality Date   COLONOSCOPY WITH PROPOFOL N/A 06/22/2018   Procedure: COLONOSCOPY WITH PROPOFOL;  Surgeon: Wyline Mood, MD;  Location: [facility] ENDOSCOPY;  Service: Endoscopy;  Laterality: N/A;   CORONARY STENT INTERVENTION N/A 06/07/2019   Procedure: CORONARY STENT INTERVENTION;  Surgeon: Iran Ouch, MD;  Location: [facility] INVASIVE CV LAB;  Service: Cardiovascular;  Laterality: N/A;   ESOPHAGOGASTRODUODENOSCOPY (EGD) WITH PROPOFOL N/A 06/22/2018   Procedure: ESOPHAGOGASTRODUODENOSCOPY (EGD) WITH PROPOFOL;  Surgeon: Wyline Mood, MD;  Location: [facility] ENDOSCOPY;  Service: Endoscopy;  Laterality: N/A;   KNEE ARTHROSCOPY Right    REPLACEMENT TOTAL KNEE BILATERAL     RIGHT/LEFT HEART CATH AND CORONARY ANGIOGRAPHY N/A 06/07/2019   Procedure: RIGHT/LEFT HEART CATH AND CORONARY ANGIOGRAPHY;  Surgeon: Antonieta Iba, MD;  Location: [facility] INVASIVE CV LAB;  Service: Cardiovascular;  Laterality: N/A;   SHOULDER SURGERY Left     Current  Medications: No outpatient medications have been marked as taking for the 08/19/23 encounter (Appointment) with Sondra Barges, PA-C.    Allergies:   Codeine, Levofloxacin, Lisinopril, Other, Povidone-iodine, Simvastatin, and Latex   Social History   Socioeconomic History   Marital status: Divorced    Spouse name: na   Number of children: 4   Years of education: 14   Highest education level: Associate degree: occupational, Scientist, product/process development, or vocational program  Occupational History   Occupation: retired  Tobacco Use   Smoking status: Never   Smokeless tobacco: Never  Vaping Use   Vaping status: Never Used  Substance and Sexual Activity   Alcohol use: No    Alcohol/week: 0.0 standard drinks of alcohol   Drug use: No   Sexual activity: Never  Other Topics Concern   Not on file  Social History Narrative   Not on file   Social Determinants of Health   Financial Resource Strain: Low Risk  (02/16/2022)   [facility] from [facility], [facility]   Overall Financial Resource Strain (CARDIA)    Difficulty of Paying Living Expenses: Not hard at all  Food Insecurity: No Food Insecurity (07/28/2023)   Hunger Vital Sign    Worried About Running Out of Food in the Last Year: Never true    Ran Out of Food in the Last Year: Never true  Transportation Needs: No Transportation Needs (07/28/2023)   PRAPARE - Transportation    Lack  of Transportation (Medical): No    Lack of Transportation (Non-Medical): No  Physical Activity: Inactive (08/18/2020)   Exercise Vital Sign    Days of Exercise per Week: 0 days    Minutes of Exercise per Session: 0 min  Stress: No Stress Concern Present (08/18/2020)   [employer] of Occupational Health - Occupational Stress Questionnaire    Feeling of Stress : Not at all  Social Connections: Socially Isolated (08/18/2020)   Social Connection and Isolation Panel [NHANES]    Frequency of Communication with Friends and Family: More than three times a  week    Frequency of Social Gatherings with Friends and Family: More than three times a week    Attends Religious Services: Never    Database administrator or Organizations: No    Attends Engineer, structural: Never    Marital Status: Divorced     Family History:  The patient's family history includes Alzheimer's disease in her brother and brother; Breast cancer in her sister; Cancer in her brother; Clotting disorder in her daughter; Fibromyalgia in her daughter; Heart attack in her brother; Heart disease in her brother, brother, father, and mother; Hypertension in her mother; Mental illness in her brother; Stroke in her brother and mother.  ROS:   12-point review of systems is negative unless otherwise noted in the HPI.   EKGs/Labs/Other Studies Reviewed:    Studies reviewed were summarized above. The additional studies were reviewed today:  2D echo 07/29/2023: 1. Left ventricular ejection fraction, by estimation, is 55 to 60%. The  left ventricle has normal function. The left ventricle has no regional  wall motion abnormalities. There is mild left ventricular hypertrophy.  Left ventricular diastolic parameters  are indeterminate.   2. Right ventricular systolic function is normal. The right ventricular  size is normal.   3. Left atrial size was moderately dilated.   4. The mitral valve is normal in structure. Mild mitral valve  regurgitation.   5. The aortic valve was not well visualized. Aortic valve regurgitation  is trivial.  __________  Carotid artery ultrasound 12/28/2022: Summary:  Right Carotid: Velocities in the right ICA are consistent with a 1-39%  stenosis.  Non-hemodynamically significant plaque <50% noted in the  CCA. The ECA appears >50% stenosed. Tortuous CCA.   Left Carotid: Velocities in the left ICA are consistent with a 1-39%  stenosis.  Non-hemodynamically significant plaque <50% noted in the  CCA. The ECA appears <50% stenosed.   Vertebrals:   Bilateral vertebral arteries demonstrate antegrade flow.  Subclavians: Bilateral subclavian artery flow was disturbed.  __________   Luci [place] patch 09/2022: Normal sinus rhythm Patient had a min HR of 41 bpm, max HR of 185 bpm, and avg HR of 70 bpm.    Bundle Branch Block/IVCD was present.  1 run of Ventricular Tachycardia occurred lasting 8 beats with a max rate of 185 bpm (avg 170  bpm).    2 Supraventricular Tachycardia runs occurred, the run with the fastest interval lasting 9 beats with a max rate of 133 bpm (avg 96 bpm); the run with the fastest interval was also the longest.    Junctional Rhythm was present.    Isolated SVEs were occasional (1.9%, 23596), SVE Couplets were rare (<1.0%, 1783), and SVE Triplets were rare (<1.0%, 296).    Isolated VEs were occasional (3.4%, 42235), VE Couplets were rare (<1.0%, 1465), and VE Triplets were rare (<1.0%, 1). Ventricular Bigeminy and Trigeminy were present.   No patient triggered  events recorded __________   2D echo 10/07/2021: 1. Frequent PVCs during study.   2. Difficult to determine LVEF due to very frequent ectopy and limited  echo windows. Based on images with no ectopy, left ventricular ejection  fraction, by estimation, is 40%. The left ventricle has  mildly-to-moderately decreased function. The left  ventricle demonstrates global hypokinesis. There is paradoxical septal  motion due to known LBBB. There is moderate concentric left ventricular  hypertrophy. Left ventricular diastolic parameters are indeterminate.   3. Right ventricular systolic function is normal. The right ventricular  size is normal.   4. Left atrial size was severely dilated.   5. The mitral valve is grossly normal. Trivial mitral valve  regurgitation.   6. The aortic valve is tricuspid. There is mild calcification of the  aortic valve. There is mild thickening of the aortic valve. Aortic valve  regurgitation is not visualized. Aortic valve  sclerosis/calcification is  present, without any evidence of  aortic stenosis.   7. The inferior vena cava is normal in size with greater than 50%  respiratory variability, suggesting right atrial pressure of 3 mmHg.  __________   2D echo 03/07/2021: 1. Left ventricular ejection fraction, by estimation, is 50 to 55%. The  left ventricle has low normal function. Left ventricular endocardial  border not optimally defined to evaluate regional wall motion. There is  moderate left ventricular hypertrophy.  Indeterminate diastolic filling due to E-A fusion.   2. Right ventricular systolic function is normal. The right ventricular  size is normal. Tricuspid regurgitation signal is inadequate for assessing  PA pressure.   3. Left atrial size was moderately dilated.   4. The mitral valve is abnormal. No evidence of mitral valve  regurgitation. No evidence of mitral stenosis.   5. The aortic valve has an indeterminant number of cusps. There is mild  thickening of the aortic valve. Aortic valve regurgitation is not  visualized. Mild aortic valve sclerosis is present, with no evidence of  aortic valve stenosis. __________   Carotid artery ultrasound 07/2020: Summary:  Right Carotid: Velocities in the right ICA are consistent with a 1-39%  stenosis.  Non-hemodynamically significant plaque <50% noted in the  CCA.  The ECA appears >50% stenosed.   Left Carotid: Velocities in the left ICA are consistent with a 1-39%  stenosis.  Non-hemodynamically significant plaque <50% noted in the  CCA.  The ECA appears <50% stenosed.   Vertebrals:  Bilateral vertebral arteries demonstrate antegrade flow.  Subclavians: Left subclavian artery was stenotic. Normal flow hemodynamics  were seen in the right subclavian artery.  __________   2D echo 09/2019: 1. Left ventricular ejection fraction, by visual estimation, is 40 to  45%. The left ventricle has moderately decreased function. There is  moderately  increased left ventricular hypertrophy.   2. Abnormal septal motion consistent with left bundle branch block.   3. Left ventricular diastolic parameters are consistent with Grade I  diastolic dysfunction (impaired relaxation).   4. The left ventricle demonstrates global hypokinesis.   5. Global right ventricle has moderately reduced systolic function.The  right ventricular size is normal. Mildly increased right ventricular wall  thickness.   6. Left atrial size was moderately dilated.   7. Right atrial size was not well visualized.   8. Presence of pericardial fat pad.   9. The mitral valve was not well visualized. Trivial mitral valve  regurgitation. No evidence of mitral stenosis.  10. The tricuspid valve is not well visualized. Tricuspid valve  regurgitation is not demonstrated.  11. The aortic valve is tricuspid. Aortic valve regurgitation is not  visualized. No evidence of aortic valve sclerosis or stenosis.  12. Pulmonic regurgitation is mild.  13. The pulmonic valve was not well visualized. Pulmonic valve  regurgitation is mild.  14. TR signal is inadequate for assessing pulmonary artery systolic  pressure.  15. The inferior vena cava is dilated in size with >50% respiratory  variability, suggesting right atrial pressure of 8 mmHg.  16. The interatrial septum was not well visualized. __________   [facility] with PCI in 06/07/2019: Mid LAD lesion is 95% stenosed. Post intervention, there is a 0% residual stenosis. A drug-eluting stent was successfully placed using a STENT RESOLUTE ONYX 2.5X22. 2nd Diag lesion is 30% stenosed.   Successful angioplasty and drug-eluting stent placement to the mid LAD.   Recommendations: Dual antiplatelet therapy for at least 1 year. Blood pressure control and cardiac rehab.  Continue treatment of risk factors.       Coronary angiography:  Coronary dominance: Right  Left mainstem:  Large vessel that bifurcates into the LAD and left circumflex,  no significant disease noted  Left anterior descending (LAD):   Large vessel that extends to the apical region, diagonal branch 2 of moderate size, proximal to mid LAD prior to large diagonal with 90% stenosis, hazy, heavily calcified.  Left circumflex (LCx):  Large vessel with OM branch 2, no significant disease noted, mild to moderate proximal to mid disease estimated 40%  Right coronary artery (RCA):  Right dominant vessel with PL and PDA, no significant disease noted, very mild proximal disease estimated 30%  Left ventriculography: Aortic valve crossed for pressures, no significant there is no significant mitral regurgitation , no significant aortic valve stenosis Frequent ectopy noted  RA pressures mean of 8 RV pressure 32/7 mean 9 PA pressure 31/16 mean 21 Wedge pressure 10 Cardiac output 3.57 Cardiac index 2.02  Final Conclusions:   Culprit vessel for presentation is the proximal to mid LAD estimated at 90% Case discussed with Dr. Kirke Corin, he will attempt PCI   Recommendations:  pci as above Cardiomyopathy meds, will make changes __________   2D echo 06/2019: 1. The left ventricle has moderately reduced systolic function, with an  ejection fraction of 35-40%. The cavity size was normal. There is  moderately increased left ventricular wall thickness. Left ventricular  diastolic Doppler parameters are  indeterminate. Left ventricular diffuse hypokinesis.   2. The right ventricle has normal systolic function. The cavity was  normal. There is no increase in right ventricular wall thickness.Unable to  estimate RVSP   3. Frequent PVCs.   4. Left atrial size was moderately dilated.   EKG:  EKG is ordered today.  The EKG ordered today demonstrates ***  Recent Labs: 07/28/2023: B Natriuretic Peptide 407.5; Magnesium 2.0; TSH 0.549 07/29/2023: ALT 13 08/02/2023: BUN 38; Creatinine, Ser 1.54; Hemoglobin 9.1; Platelets 125; Potassium 4.3; Sodium 136  Recent Lipid Panel     Component Value Date/Time   CHOL 90 07/28/2023 1628   CHOL 115 12/15/2021 1014   CHOL 120 10/19/2011 0323   TRIG 49 07/28/2023 1628   TRIG 152 10/19/2011 0323   HDL 29 (L) 07/28/2023 1628   HDL 29 (L) 12/15/2021 1014   HDL 15 (L) 10/19/2011 0323   CHOLHDL 3.1 07/28/2023 1628   VLDL 10 07/28/2023 1628   VLDL 30 10/19/2011 0323   LDLCALC 51 07/28/2023 1628   LDLCALC 53 12/15/2021 1014   LDLCALC 75 10/19/2011  7829    PHYSICAL EXAM:    VS:  There were no vitals taken for this visit.  BMI: There is no height or weight on file to calculate BMI.  Physical Exam  Wt Readings from Last 3 Encounters:  07/31/23 149 lb 14.6 oz (68 kg)  06/21/23 142 lb (64.4 kg)  02/09/23 147 lb 9.6 oz (67 kg)     ASSESSMENT & PLAN:   ***   {Are you ordering a CV Procedure (e.g. stress test, cath, DCCV, TEE, etc)?   Press F2        :[id number]}     Disposition: F/u with Dr. Mariah Milling or an APP in ***.   Medication Adjustments/Labs and Tests Ordered: Current medicines are reviewed at length with the patient today.  Concerns regarding medicines are outlined above. Medication changes, Labs and Tests ordered today are summarized above and listed in the Patient Instructions accessible in Encounters.   Signed, Eula Listen, PA-C 08/12/2023 4:48 PM     [facility] [place] [address] [place], [place] [address] [phone]

## 2023-08-18 DIAGNOSIS — Z7902 Long term (current) use of antithrombotics/antiplatelets: Secondary | ICD-10-CM | POA: Diagnosis not present

## 2023-08-18 DIAGNOSIS — I5020 Unspecified systolic (congestive) heart failure: Secondary | ICD-10-CM | POA: Diagnosis not present

## 2023-08-18 DIAGNOSIS — N390 Urinary tract infection, site not specified: Secondary | ICD-10-CM | POA: Diagnosis not present

## 2023-08-18 DIAGNOSIS — I214 Non-ST elevation (NSTEMI) myocardial infarction: Secondary | ICD-10-CM | POA: Diagnosis not present

## 2023-08-18 DIAGNOSIS — Z955 Presence of coronary angioplasty implant and graft: Secondary | ICD-10-CM | POA: Diagnosis not present

## 2023-08-18 DIAGNOSIS — I130 Hypertensive heart and chronic kidney disease with heart failure and stage 1 through stage 4 chronic kidney disease, or unspecified chronic kidney disease: Secondary | ICD-10-CM | POA: Diagnosis not present

## 2023-08-18 DIAGNOSIS — Z8673 Personal history of transient ischemic attack (TIA), and cerebral infarction without residual deficits: Secondary | ICD-10-CM | POA: Diagnosis not present

## 2023-08-18 DIAGNOSIS — I288 Other diseases of pulmonary vessels: Secondary | ICD-10-CM | POA: Diagnosis not present

## 2023-08-18 DIAGNOSIS — R202 Paresthesia of skin: Secondary | ICD-10-CM | POA: Diagnosis not present

## 2023-08-18 DIAGNOSIS — I700 Atherosclerosis of aorta: Secondary | ICD-10-CM | POA: Diagnosis not present

## 2023-08-18 DIAGNOSIS — E785 Hyperlipidemia, unspecified: Secondary | ICD-10-CM | POA: Diagnosis not present

## 2023-08-18 DIAGNOSIS — N184 Chronic kidney disease, stage 4 (severe): Secondary | ICD-10-CM | POA: Diagnosis not present

## 2023-08-18 DIAGNOSIS — G9341 Metabolic encephalopathy: Secondary | ICD-10-CM | POA: Diagnosis not present

## 2023-08-18 DIAGNOSIS — E039 Hypothyroidism, unspecified: Secondary | ICD-10-CM | POA: Diagnosis not present

## 2023-08-18 DIAGNOSIS — I080 Rheumatic disorders of both mitral and aortic valves: Secondary | ICD-10-CM | POA: Diagnosis not present

## 2023-08-18 DIAGNOSIS — Z7982 Long term (current) use of aspirin: Secondary | ICD-10-CM | POA: Diagnosis not present

## 2023-08-18 DIAGNOSIS — I272 Pulmonary hypertension, unspecified: Secondary | ICD-10-CM | POA: Diagnosis not present

## 2023-08-18 DIAGNOSIS — I2510 Atherosclerotic heart disease of native coronary artery without angina pectoris: Secondary | ICD-10-CM | POA: Diagnosis not present

## 2023-08-18 DIAGNOSIS — I255 Ischemic cardiomyopathy: Secondary | ICD-10-CM | POA: Diagnosis not present

## 2023-08-18 DIAGNOSIS — D631 Anemia in chronic kidney disease: Secondary | ICD-10-CM | POA: Diagnosis not present

## 2023-08-18 DIAGNOSIS — J439 Emphysema, unspecified: Secondary | ICD-10-CM | POA: Diagnosis not present

## 2023-08-18 DIAGNOSIS — I5022 Chronic systolic (congestive) heart failure: Secondary | ICD-10-CM | POA: Diagnosis not present

## 2023-08-19 ENCOUNTER — Ambulatory Visit: Payer: Medicare Other | Attending: Physician Assistant | Admitting: Physician Assistant

## 2023-08-23 DIAGNOSIS — N184 Chronic kidney disease, stage 4 (severe): Secondary | ICD-10-CM | POA: Diagnosis not present

## 2023-08-23 DIAGNOSIS — I272 Pulmonary hypertension, unspecified: Secondary | ICD-10-CM | POA: Diagnosis not present

## 2023-08-23 DIAGNOSIS — I2510 Atherosclerotic heart disease of native coronary artery without angina pectoris: Secondary | ICD-10-CM | POA: Diagnosis not present

## 2023-08-23 DIAGNOSIS — E039 Hypothyroidism, unspecified: Secondary | ICD-10-CM | POA: Diagnosis not present

## 2023-08-23 DIAGNOSIS — I700 Atherosclerosis of aorta: Secondary | ICD-10-CM | POA: Diagnosis not present

## 2023-08-23 DIAGNOSIS — Z7982 Long term (current) use of aspirin: Secondary | ICD-10-CM | POA: Diagnosis not present

## 2023-08-23 DIAGNOSIS — R202 Paresthesia of skin: Secondary | ICD-10-CM | POA: Diagnosis not present

## 2023-08-23 DIAGNOSIS — Z8673 Personal history of transient ischemic attack (TIA), and cerebral infarction without residual deficits: Secondary | ICD-10-CM | POA: Diagnosis not present

## 2023-08-23 DIAGNOSIS — E785 Hyperlipidemia, unspecified: Secondary | ICD-10-CM | POA: Diagnosis not present

## 2023-08-23 DIAGNOSIS — Z955 Presence of coronary angioplasty implant and graft: Secondary | ICD-10-CM | POA: Diagnosis not present

## 2023-08-23 DIAGNOSIS — D631 Anemia in chronic kidney disease: Secondary | ICD-10-CM | POA: Diagnosis not present

## 2023-08-23 DIAGNOSIS — I5020 Unspecified systolic (congestive) heart failure: Secondary | ICD-10-CM | POA: Diagnosis not present

## 2023-08-23 DIAGNOSIS — G9341 Metabolic encephalopathy: Secondary | ICD-10-CM | POA: Diagnosis not present

## 2023-08-23 DIAGNOSIS — I080 Rheumatic disorders of both mitral and aortic valves: Secondary | ICD-10-CM | POA: Diagnosis not present

## 2023-08-23 DIAGNOSIS — Z7902 Long term (current) use of antithrombotics/antiplatelets: Secondary | ICD-10-CM | POA: Diagnosis not present

## 2023-08-23 DIAGNOSIS — I214 Non-ST elevation (NSTEMI) myocardial infarction: Secondary | ICD-10-CM | POA: Diagnosis not present

## 2023-08-23 DIAGNOSIS — I130 Hypertensive heart and chronic kidney disease with heart failure and stage 1 through stage 4 chronic kidney disease, or unspecified chronic kidney disease: Secondary | ICD-10-CM | POA: Diagnosis not present

## 2023-08-23 DIAGNOSIS — I255 Ischemic cardiomyopathy: Secondary | ICD-10-CM | POA: Diagnosis not present

## 2023-08-23 DIAGNOSIS — I5022 Chronic systolic (congestive) heart failure: Secondary | ICD-10-CM | POA: Diagnosis not present

## 2023-08-23 DIAGNOSIS — N390 Urinary tract infection, site not specified: Secondary | ICD-10-CM | POA: Diagnosis not present

## 2023-08-23 DIAGNOSIS — J439 Emphysema, unspecified: Secondary | ICD-10-CM | POA: Diagnosis not present

## 2023-08-23 DIAGNOSIS — I288 Other diseases of pulmonary vessels: Secondary | ICD-10-CM | POA: Diagnosis not present

## 2023-08-25 DIAGNOSIS — D509 Iron deficiency anemia, unspecified: Secondary | ICD-10-CM | POA: Diagnosis not present

## 2023-08-25 DIAGNOSIS — N3000 Acute cystitis without hematuria: Secondary | ICD-10-CM | POA: Diagnosis not present

## 2023-08-25 DIAGNOSIS — I25118 Atherosclerotic heart disease of native coronary artery with other forms of angina pectoris: Secondary | ICD-10-CM | POA: Diagnosis not present

## 2023-08-25 DIAGNOSIS — Z8673 Personal history of transient ischemic attack (TIA), and cerebral infarction without residual deficits: Secondary | ICD-10-CM | POA: Diagnosis not present

## 2023-08-25 DIAGNOSIS — Z0000 Encounter for general adult medical examination without abnormal findings: Secondary | ICD-10-CM | POA: Diagnosis not present

## 2023-08-25 DIAGNOSIS — I509 Heart failure, unspecified: Secondary | ICD-10-CM | POA: Diagnosis not present

## 2023-08-25 DIAGNOSIS — N1832 Chronic kidney disease, stage 3b: Secondary | ICD-10-CM | POA: Diagnosis not present

## 2023-08-30 DIAGNOSIS — R202 Paresthesia of skin: Secondary | ICD-10-CM | POA: Diagnosis not present

## 2023-08-30 DIAGNOSIS — Z7902 Long term (current) use of antithrombotics/antiplatelets: Secondary | ICD-10-CM | POA: Diagnosis not present

## 2023-08-30 DIAGNOSIS — E785 Hyperlipidemia, unspecified: Secondary | ICD-10-CM | POA: Diagnosis not present

## 2023-08-30 DIAGNOSIS — N390 Urinary tract infection, site not specified: Secondary | ICD-10-CM | POA: Diagnosis not present

## 2023-08-30 DIAGNOSIS — I130 Hypertensive heart and chronic kidney disease with heart failure and stage 1 through stage 4 chronic kidney disease, or unspecified chronic kidney disease: Secondary | ICD-10-CM | POA: Diagnosis not present

## 2023-08-30 DIAGNOSIS — N184 Chronic kidney disease, stage 4 (severe): Secondary | ICD-10-CM | POA: Diagnosis not present

## 2023-08-30 DIAGNOSIS — I2510 Atherosclerotic heart disease of native coronary artery without angina pectoris: Secondary | ICD-10-CM | POA: Diagnosis not present

## 2023-08-30 DIAGNOSIS — I288 Other diseases of pulmonary vessels: Secondary | ICD-10-CM | POA: Diagnosis not present

## 2023-08-30 DIAGNOSIS — Z955 Presence of coronary angioplasty implant and graft: Secondary | ICD-10-CM | POA: Diagnosis not present

## 2023-08-30 DIAGNOSIS — E039 Hypothyroidism, unspecified: Secondary | ICD-10-CM | POA: Diagnosis not present

## 2023-08-30 DIAGNOSIS — Z7982 Long term (current) use of aspirin: Secondary | ICD-10-CM | POA: Diagnosis not present

## 2023-08-30 DIAGNOSIS — I272 Pulmonary hypertension, unspecified: Secondary | ICD-10-CM | POA: Diagnosis not present

## 2023-08-30 DIAGNOSIS — I5022 Chronic systolic (congestive) heart failure: Secondary | ICD-10-CM | POA: Diagnosis not present

## 2023-08-30 DIAGNOSIS — Z8673 Personal history of transient ischemic attack (TIA), and cerebral infarction without residual deficits: Secondary | ICD-10-CM | POA: Diagnosis not present

## 2023-08-30 DIAGNOSIS — G9341 Metabolic encephalopathy: Secondary | ICD-10-CM | POA: Diagnosis not present

## 2023-08-30 DIAGNOSIS — I214 Non-ST elevation (NSTEMI) myocardial infarction: Secondary | ICD-10-CM | POA: Diagnosis not present

## 2023-08-30 DIAGNOSIS — I5020 Unspecified systolic (congestive) heart failure: Secondary | ICD-10-CM | POA: Diagnosis not present

## 2023-08-30 DIAGNOSIS — I080 Rheumatic disorders of both mitral and aortic valves: Secondary | ICD-10-CM | POA: Diagnosis not present

## 2023-08-30 DIAGNOSIS — J439 Emphysema, unspecified: Secondary | ICD-10-CM | POA: Diagnosis not present

## 2023-08-30 DIAGNOSIS — D631 Anemia in chronic kidney disease: Secondary | ICD-10-CM | POA: Diagnosis not present

## 2023-08-30 DIAGNOSIS — I700 Atherosclerosis of aorta: Secondary | ICD-10-CM | POA: Diagnosis not present

## 2023-08-30 DIAGNOSIS — I255 Ischemic cardiomyopathy: Secondary | ICD-10-CM | POA: Diagnosis not present

## 2023-09-13 ENCOUNTER — Other Ambulatory Visit: Payer: Self-pay | Admitting: Cardiovascular Disease

## 2023-09-14 DIAGNOSIS — Z7902 Long term (current) use of antithrombotics/antiplatelets: Secondary | ICD-10-CM | POA: Diagnosis not present

## 2023-09-14 DIAGNOSIS — G9341 Metabolic encephalopathy: Secondary | ICD-10-CM | POA: Diagnosis not present

## 2023-09-14 DIAGNOSIS — E039 Hypothyroidism, unspecified: Secondary | ICD-10-CM | POA: Diagnosis not present

## 2023-09-14 DIAGNOSIS — I5020 Unspecified systolic (congestive) heart failure: Secondary | ICD-10-CM | POA: Diagnosis not present

## 2023-09-14 DIAGNOSIS — Z955 Presence of coronary angioplasty implant and graft: Secondary | ICD-10-CM | POA: Diagnosis not present

## 2023-09-14 DIAGNOSIS — Z8673 Personal history of transient ischemic attack (TIA), and cerebral infarction without residual deficits: Secondary | ICD-10-CM | POA: Diagnosis not present

## 2023-09-14 DIAGNOSIS — R202 Paresthesia of skin: Secondary | ICD-10-CM | POA: Diagnosis not present

## 2023-09-14 DIAGNOSIS — N390 Urinary tract infection, site not specified: Secondary | ICD-10-CM | POA: Diagnosis not present

## 2023-09-14 DIAGNOSIS — I272 Pulmonary hypertension, unspecified: Secondary | ICD-10-CM | POA: Diagnosis not present

## 2023-09-14 DIAGNOSIS — D631 Anemia in chronic kidney disease: Secondary | ICD-10-CM | POA: Diagnosis not present

## 2023-09-14 DIAGNOSIS — I214 Non-ST elevation (NSTEMI) myocardial infarction: Secondary | ICD-10-CM | POA: Diagnosis not present

## 2023-09-14 DIAGNOSIS — J439 Emphysema, unspecified: Secondary | ICD-10-CM | POA: Diagnosis not present

## 2023-09-14 DIAGNOSIS — I700 Atherosclerosis of aorta: Secondary | ICD-10-CM | POA: Diagnosis not present

## 2023-09-14 DIAGNOSIS — I130 Hypertensive heart and chronic kidney disease with heart failure and stage 1 through stage 4 chronic kidney disease, or unspecified chronic kidney disease: Secondary | ICD-10-CM | POA: Diagnosis not present

## 2023-09-14 DIAGNOSIS — N184 Chronic kidney disease, stage 4 (severe): Secondary | ICD-10-CM | POA: Diagnosis not present

## 2023-09-14 DIAGNOSIS — I288 Other diseases of pulmonary vessels: Secondary | ICD-10-CM | POA: Diagnosis not present

## 2023-09-14 DIAGNOSIS — I080 Rheumatic disorders of both mitral and aortic valves: Secondary | ICD-10-CM | POA: Diagnosis not present

## 2023-09-14 DIAGNOSIS — I255 Ischemic cardiomyopathy: Secondary | ICD-10-CM | POA: Diagnosis not present

## 2023-09-14 DIAGNOSIS — Z7982 Long term (current) use of aspirin: Secondary | ICD-10-CM | POA: Diagnosis not present

## 2023-09-14 DIAGNOSIS — I2510 Atherosclerotic heart disease of native coronary artery without angina pectoris: Secondary | ICD-10-CM | POA: Diagnosis not present

## 2023-09-14 DIAGNOSIS — I5022 Chronic systolic (congestive) heart failure: Secondary | ICD-10-CM | POA: Diagnosis not present

## 2023-09-14 DIAGNOSIS — E785 Hyperlipidemia, unspecified: Secondary | ICD-10-CM | POA: Diagnosis not present

## 2023-11-01 ENCOUNTER — Telehealth: Payer: Self-pay | Admitting: Cardiovascular Disease

## 2023-11-01 NOTE — Telephone Encounter (Signed)
Pt c/o swelling/edema: STAT if pt has developed SOB within 24 hours  If swelling, where is the swelling located? Feet are swollen bad   How much weight have you gained and in what time span? 1 pd in a day  Have you gained 2 pounds in a day or 5 pounds in a week? She is not sure   Do you have a log of your daily weights (if so, list)? no  Are you currently taking a fluid pill? Yes   Are you currently SOB? No   Have you traveled recently in a car or plane for an extended period of time? No

## 2023-11-01 NOTE — Telephone Encounter (Signed)
Called and spoke with patient. Patient with complaint of increase swelling to bilateral feet and ankles over the past week. Patient denies any weight gain or shortness of breath. Patient reports current blood pressure 173/71 and heart rate 68. Patient reports that she had vomiting and diarrhea from Norovirus that started yesterday and those symptoms have improved. Patient offered an appointment in clinic but patient states that she is unable to come in to the clinic. Patient requesting recommendations from Dr. Mariah Charles.

## 2023-11-04 ENCOUNTER — Telehealth: Payer: Self-pay | Admitting: *Deleted

## 2023-11-04 NOTE — Telephone Encounter (Signed)
Left voicemail message to call back.   Received one page of application and needed to review information with patient and inquire about other documentation. Will await her return call.

## 2023-11-04 NOTE — Telephone Encounter (Signed)
 Patient returned call

## 2023-11-04 NOTE — Telephone Encounter (Signed)
Spoke with patient and reviewed that I do not have the complete application. She reports sending them her portion and all they need now is the provider page. Advised that I will get that faxed to them today and to let us know if she should need any further assistance. She verbalized understanding.   Page 2 faxed to company

## 2023-11-04 NOTE — Telephone Encounter (Signed)
Called patient and notified her of the following from Dr. Mariah Milling.  Hard to say what is causing her leg swelling  Torsemide was held on hospitalization October 2024  She was also started on amlodipine  Would recommend she take torsemide with potassium 3 days in a row  Wear compression hose, leg elevation  If ankle swelling persists, we may need to decrease or stop the amlodipine  Would recommend follow-up in clinic myself or APP  Thx  TGollan   Patient verbalized understanding. Patient scheduled for 11/08/23.

## 2023-11-07 NOTE — Progress Notes (Signed)
 Cardiology Office Note  Date:  11/08/2023   ID:  Stephanie Charles, DOB September 03, 1930, MRN [id number]  PCP:  Stephanie Charlie CROME, MD   Chief Complaint  Patient presents with   Leg Swelling    Patient c/o tremors with having difficulty with holding on to her medications, shortness of breath and bilateral LE edema with more in the ankles.     HPI:  [name] is a 89 y.o. female with history of  demand ischemia with hypertensive urgency,  LBBB,  TIA/CVA in 2016,  CKD stage II,  HTN,  HLD,  carotid artery disease,  hypothyroidism,  anxiety, depression,  Coronary disease, stent placed to proximal to mid LAD  ejection fraction 35 to 40%, repeat 09/2019: 40 to 45% ECHO: January 2023 ejection fraction 40% Who presents for follow-up of her ischemic cardiomyopathy, PAD  Last seen in clinic by myself September 2024  In the hospital October 2024 non-STEMI, CHF Presented to the hospital with chest pain, hypertension VQ scan with no PE echo detailing ejection fraction 50 to 55% Was treated with heparin  infusion 48 hours Was felt not a candidate for ischemic workup given age, debility, medical management recommended Entresto , torsemide  Coreg  held at discharge, beta-blocker held for bradycardia  At discharge, started on amlodipine  5 twice daily, hydralazine  50 3 times daily, isosorbide  60 twice daily (previously taking daily)  Lab work reviewed Creatinine 1.5 BUN 31  On further discussion today, She is back on low-dose Entresto , back on low-dose carvedilol  Episode of diaphoresis last week,  ferlt cold, weak, Went to bed, felt better Still feels little bit weak Low BP today Ankle swelling since she left the hospital Took 2 days of torsemide , minimal improvement  EKG personally reviewed by myself on todays visit EKG Interpretation Date/Time:  Tuesday November 08 2023 11:09:06 EST Ventricular Rate:  60 PR Interval:  218 QRS Duration:  150 QT Interval:  466 QTC  Calculation: 466 R Axis:   -26  Text Interpretation: Sinus rhythm with 1st degree A-V block Left bundle branch block When compared with ECG of 28-Jul-2023 23:58, rate has improved Confirmed by Perla Lye [phone]) on 11/08/2023 11:10:42 AM    Other past medical history reviewed  C.diff better , no recent diverticulitis after long course of antibiotics 02/2022  Echocardiogram   1. Frequent PVCs during study.  Left ventricular ejection  fraction, by estimation, is 40%.  paradoxical septal  motion due to known LBBB. There is moderate concentric left ventricular  hypertrophy.   3. Right ventricular systolic function is normal. The right ventricular  size is normal.   4. Left atrial size was severely dilated.   Covid, in hospital,March 06 2021 BP elevated on arrival to the hospital Creatinine 0.9 on arrival,  Treated with diuretic for shortness of breath, concern for CHF Sent home 03/10/2021,d/c with CR 1.6, BUN 92, above her baseline  After discharge, had episode of syncope on toilet in the setting of diarrhea on 03/11/21 Presented to the hospital again with dehydration multiple episodes of profuse watery diarrhea bradycardic into the 40s, given 0.5 mg of atropine  in the ER Creatinine 2.12, BUN 100 Given IV fluids  hospital September 2020, ejection fraction 35 to 40% She had symptoms of chest pain and accelerated HTN found to have elevated high sensitivity troponin peaking at 296 and acute systolic CHF.    cardiac catheterization, with severe proximal to mid LAD disease, stent placed  Follow-up echocardiogram December 2020 ejection fraction 40 up to 45% Tolerating Entresto ,  carvedilol , HCTZ Reports blood pressure stable at home  PMH:   has a past medical history of Anemia, C. difficile diarrhea, CHF (congestive heart failure) (HCC) (10/05/2021), Diverticulitis, GERD (gastroesophageal reflux disease), Hyperlipidemia, Hypertension, Hypothyroidism, Myocardial infarction (HCC) (08/2017),  TIA (transient ischemic attack), Vertigo, and [name].  PSH:    Past Surgical History:  Procedure Laterality Date   COLONOSCOPY WITH PROPOFOL  N/A 06/22/2018   Procedure: COLONOSCOPY WITH PROPOFOL ;  Surgeon: Therisa Bi, MD;  Location: [facility] ENDOSCOPY;  Service: Endoscopy;  Laterality: N/A;   CORONARY STENT INTERVENTION N/A 06/07/2019   Procedure: CORONARY STENT INTERVENTION;  Surgeon: Darron Deatrice LABOR, MD;  Location: [facility] INVASIVE CV LAB;  Service: Cardiovascular;  Laterality: N/A;   ESOPHAGOGASTRODUODENOSCOPY (EGD) WITH PROPOFOL  N/A 06/22/2018   Procedure: ESOPHAGOGASTRODUODENOSCOPY (EGD) WITH PROPOFOL ;  Surgeon: Therisa Bi, MD;  Location: [facility] ENDOSCOPY;  Service: Endoscopy;  Laterality: N/A;   KNEE ARTHROSCOPY Right    REPLACEMENT TOTAL KNEE BILATERAL     RIGHT/LEFT HEART CATH AND CORONARY ANGIOGRAPHY N/A 06/07/2019   Procedure: RIGHT/LEFT HEART CATH AND CORONARY ANGIOGRAPHY;  Surgeon: Perla Evalene PARAS, MD;  Location: [facility] INVASIVE CV LAB;  Service: Cardiovascular;  Laterality: N/A;   SHOULDER SURGERY Left     Current Outpatient Medications  Medication Sig Dispense Refill   albuterol  (PROVENTIL ) (2.5 MG/3ML) 0.083% nebulizer solution USE 1 VIAL IN NEBULIZER EVERY 4 HOURS AS NEEDED 180 mL 6   allopurinol  (ZYLOPRIM ) 300 MG tablet Take 150 mg by mouth daily.     amitriptyline  (ELAVIL ) 10 MG tablet Take 1 tablet (10 mg total) by mouth at bedtime. 90 tablet 3   amLODipine  (NORVASC ) 5 MG tablet Take 1 tablet (5 mg total) by mouth 2 (two) times daily. 30 tablet 1   ASPIRIN  LOW DOSE 81 MG EC tablet NEW PRESCRIPTION REQUEST: TAKE ONE TABLET BY MOUTH EVERY DAY 90 tablet 3   atorvastatin  (LIPITOR) 40 MG tablet Take 1 tablet by mouth once daily 90 tablet 3   carvedilol  (COREG ) 3.125 MG tablet Take 3.125 mg by mouth 2 (two) times daily with a meal.     clopidogrel  (PLAVIX ) 75 MG tablet Take 1 tablet by mouth once daily 90 tablet 3   cyclobenzaprine  (FLEXERIL ) 5 MG tablet Take 5 mg  by mouth 3 (three) times daily as needed.     Ferrous Sulfate  27 MG TABS Take 65 mg by mouth at bedtime.     hydrALAZINE  (APRESOLINE ) 50 MG tablet TAKE 1 TABLET BY MOUTH THREE TIMES A DAY, FOR HIGH BLOOD PRESSURE >150 TAKE EXTRA HYDRALAZINE  270 tablet 0   isosorbide  mononitrate (IMDUR ) 60 MG 24 hr tablet Take 1 tablet (60 mg total) by mouth 2 (two) times daily. 30 tablet 1   levothyroxine  (SYNTHROID ) 137 MCG tablet Take 1 tablet (137 mcg total) by mouth daily before breakfast. 90 tablet 3   Melatonin 10 MG TABS Take 20 mg by mouth at bedtime.     mirtazapine  (REMERON ) 30 MG tablet TAKE 1 TABLET BY MOUTH AT BEDTIME 90 tablet 3   Multiple Vitamin (MULTIVITAMIN) capsule Take 1 capsule by mouth every evening.     nitroGLYCERIN  (NITROSTAT ) 0.4 MG SL tablet Place 1 tablet (0.4 mg total) under the tongue every 5 (five) minutes as needed for chest pain. 25 tablet 3   pantoprazole  (PROTONIX ) 40 MG tablet Take 1 tablet (40 mg total) by mouth daily. 90 tablet 3   potassium chloride  (KLOR-CON ) 10 MEQ tablet Take 10 mEq by mouth daily.  sacubitril -valsartan  (ENTRESTO ) 24-26 MG Take 1 tablet by mouth 2 (two) times daily. 60 tablet 1   sertraline  (ZOLOFT ) 25 MG tablet Take 1 tablet by mouth once daily 90 tablet 3   torsemide  (DEMADEX ) 20 MG tablet Take 20 mg by mouth as needed.     No current facility-administered medications for this visit.    Allergies:   Codeine, Levofloxacin, Lisinopril, Other, Povidone-iodine, Simvastatin, and Latex   Social History:  The patient  reports that she has never smoked. She has never used smokeless tobacco. She reports that she does not drink alcohol and does not use drugs.   Family History:   family history includes Alzheimer's disease in her brother and brother; Breast cancer in her sister; Cancer in her brother; Clotting disorder in her daughter; Fibromyalgia in her daughter; Heart attack in her brother; Heart disease in her brother, brother, father, and mother;  Hypertension in her mother; Mental illness in her brother; Stroke in her brother and mother.    Review of Systems: Review of Systems  Constitutional: Negative.   HENT: Negative.    Respiratory: Negative.    Cardiovascular: Negative.   Gastrointestinal: Negative.   Musculoskeletal: Negative.   Neurological: Negative.   Psychiatric/Behavioral: Negative.    All other systems reviewed and are negative.   PHYSICAL EXAM: VS:  BP (!) 100/50 (BP Location: Left Arm, Patient Position: Sitting, Cuff Size: Normal)   Pulse 60   Ht 5' 1 (1.549 m)   Wt 147 lb 8 oz (66.9 kg)   SpO2 96%   BMI 27.87 kg/m  , BMI Body mass index is 27.87 kg/m. Constitutional:  oriented to person, place, and time. No distress.  HENT:  Head: Grossly normal Eyes:  no discharge. No scleral icterus.  Neck: No JVD, no carotid bruits  Cardiovascular: Regular rate and rhythm, no murmurs appreciated Pulmonary/Chest: Clear to auscultation bilaterally, no wheezes or rails Abdominal: Soft.  no distension.  no tenderness.  Musculoskeletal: Normal range of motion Neurological:  normal muscle tone. Coordination normal. No atrophy Skin: Skin warm and dry Psychiatric: normal affect, pleasant  Recent Labs: 07/28/2023: B Natriuretic Peptide 407.5; Magnesium  2.0; TSH 0.549 07/29/2023: ALT 13 08/02/2023: BUN 38; Creatinine, Ser 1.54; Hemoglobin 9.1; Platelets 125; Potassium 4.3; Sodium 136   Lipid Panel Lab Results  Component Value Date   CHOL 90 07/28/2023   HDL 29 (L) 07/28/2023   LDLCALC 51 07/28/2023   TRIG 49 07/28/2023    Wt Readings from Last 3 Encounters:  11/08/23 147 lb 8 oz (66.9 kg)  07/31/23 149 lb 14.6 oz (68 kg)  06/21/23 142 lb (64.4 kg)     ASSESSMENT AND PLAN:  Problem List Items Addressed This Visit       Cardiology Problems   Essential hypertension   Relevant Medications   carvedilol  (COREG ) 3.125 MG tablet   torsemide  (DEMADEX ) 20 MG tablet   Other Relevant Orders   EKG 12-Lead  (Completed)   CAD (coronary artery disease) - Primary   Relevant Medications   carvedilol  (COREG ) 3.125 MG tablet   torsemide  (DEMADEX ) 20 MG tablet   Other Relevant Orders   EKG 12-Lead (Completed)   Ischemic cardiomyopathy   Relevant Medications   carvedilol  (COREG ) 3.125 MG tablet   torsemide  (DEMADEX ) 20 MG tablet   Other Relevant Orders   EKG 12-Lead (Completed)   HFrEF (heart failure with reduced ejection fraction) (HCC)   Relevant Medications   carvedilol  (COREG ) 3.125 MG tablet   torsemide  (DEMADEX ) 20 MG tablet  Other Relevant Orders   EKG 12-Lead (Completed)   Junctional bradycardia   Relevant Medications   carvedilol  (COREG ) 3.125 MG tablet   torsemide  (DEMADEX ) 20 MG tablet   Other Relevant Orders   EKG 12-Lead (Completed)   Carotid stenosis   Relevant Medications   carvedilol  (COREG ) 3.125 MG tablet   torsemide  (DEMADEX ) 20 MG tablet   Other Relevant Orders   EKG 12-Lead (Completed)     Other   Acid reflux   Other Visit Diagnoses       Stage 3b chronic kidney disease (HCC)         Hyperlipidemia LDL goal <70       Relevant Medications   carvedilol  (COREG ) 3.125 MG tablet   torsemide  (DEMADEX ) 20 MG tablet     Labile hypertension       Relevant Medications   carvedilol  (COREG ) 3.125 MG tablet   torsemide  (DEMADEX ) 20 MG tablet       Ischemic cardiomyopathy Tolerating low-dose Entresto , low-dose carvedilol , hydralazine  50 3 times daily Isosorbide  twice daily Has lower extremity edema likely exacerbated by amlodipine  We will stop amlodipine  especially in light of low blood pressure today Recommend she monitor blood pressure at home and call us  if numbers continue to run low If blood pressure runs low, may need to cut back on her hydralazine   CAD with stable angina Stent placed to her proximal to mid LAD Ejection fraction 40 to 45%, unchanged from 2020 Hospitalization October 2024 non-STEMI, decision made to manage medically Continue isosorbide ,  aspirin  and statin Denies sniffing and chest pain concerning for angina  Carotid bruit Mild disease bilaterally, less than 39%  Hyperlipidemia On Lipitor 40 daily, goal LDL less than 70  Chronic renal sufficiency stage III Stable renal function, creatinine 1.5  Essential hypertension On isosorbide  twice daily, low-dose carvedilol , low-dose Entresto , hydralazine  We will stop amlodipine  in light of ankle swelling and low blood pressure systolic of 100 and feeling weak  Signed, Velinda Lunger, M.D., Ph.D. [facility] [place], [place] [phone]

## 2023-11-08 ENCOUNTER — Ambulatory Visit: Payer: Medicare Other | Attending: Cardiovascular Disease | Admitting: Cardiovascular Disease

## 2023-11-08 ENCOUNTER — Encounter: Payer: Self-pay | Admitting: Cardiovascular Disease

## 2023-11-08 VITALS — BP 100/50 | HR 60 | Ht 61.0 in | Wt 147.5 lb

## 2023-11-08 DIAGNOSIS — I6523 Occlusion and stenosis of bilateral carotid arteries: Secondary | ICD-10-CM

## 2023-11-08 DIAGNOSIS — R0989 Other specified symptoms and signs involving the circulatory and respiratory systems: Secondary | ICD-10-CM

## 2023-11-08 DIAGNOSIS — E785 Hyperlipidemia, unspecified: Secondary | ICD-10-CM

## 2023-11-08 DIAGNOSIS — I10 Essential (primary) hypertension: Secondary | ICD-10-CM

## 2023-11-08 DIAGNOSIS — I255 Ischemic cardiomyopathy: Secondary | ICD-10-CM

## 2023-11-08 DIAGNOSIS — N1832 Chronic kidney disease, stage 3b: Secondary | ICD-10-CM | POA: Diagnosis not present

## 2023-11-08 DIAGNOSIS — R001 Bradycardia, unspecified: Secondary | ICD-10-CM

## 2023-11-08 DIAGNOSIS — K219 Gastro-esophageal reflux disease without esophagitis: Secondary | ICD-10-CM

## 2023-11-08 DIAGNOSIS — I2510 Atherosclerotic heart disease of native coronary artery without angina pectoris: Secondary | ICD-10-CM

## 2023-11-08 DIAGNOSIS — I5020 Unspecified systolic (congestive) heart failure: Secondary | ICD-10-CM

## 2023-11-08 MED ORDER — POTASSIUM CHLORIDE ER 10 MEQ PO TBCR: 10.0000 meq | EXTENDED_RELEASE_TABLET | Freq: Every day | ORAL | 3 refills | Status: DC | PRN

## 2023-11-08 MED ORDER — HYDRALAZINE HCL 50 MG PO TABS: ORAL_TABLET | ORAL | 3 refills | Status: DC

## 2023-11-08 MED ORDER — ISOSORBIDE MONONITRATE ER 60 MG PO TB24: 60.0000 mg | ORAL_TABLET | Freq: Two times a day (BID) | ORAL | 3 refills | Status: DC

## 2023-11-08 MED ORDER — SACUBITRIL-VALSARTAN 24-26 MG PO TABS: 1.0000 | ORAL_TABLET | Freq: Two times a day (BID) | ORAL | 1 refills | Status: DC

## 2023-11-08 MED ORDER — PANTOPRAZOLE SODIUM 40 MG PO TBEC: 40.0000 mg | DELAYED_RELEASE_TABLET | Freq: Every day | ORAL | 3 refills | Status: DC

## 2023-11-08 MED ORDER — CARVEDILOL 3.125 MG PO TABS: 3.1250 mg | ORAL_TABLET | Freq: Two times a day (BID) | ORAL | 3 refills | Status: AC

## 2023-11-08 NOTE — Patient Instructions (Addendum)
 Medication Instructions:  Stop the amlodipine   Monitor blood pressure, call if running low  If you need a refill on your cardiac medications before your next appointment, please call your pharmacy.   Lab work: No new labs needed  Testing/Procedures: No new testing needed  Follow-Up: At [facility], you and your health needs are our priority.  As part of our continuing mission to provide you with exceptional heart care, we have created designated Provider Care Teams.  These Care Teams include your primary Cardiologist (physician) and Advanced Practice Providers (APPs -  Physician Assistants and Nurse Practitioners) who all work together to provide you with the care you need, when you need it.  You will need a follow up appointment in 6 months  Providers on your designated Care Team:   Lonni Meager, NP Bernardino Bring, PA-C Cadence [place], [place]  COVID-19 Vaccine Information can be found at: [email or web address] For questions related to vaccine distribution or appointments, please email [email or web address] or call [phone].

## 2023-11-22 ENCOUNTER — Telehealth: Payer: Self-pay | Admitting: Cardiovascular Disease

## 2023-11-22 NOTE — Telephone Encounter (Signed)
Pt's daughter dropped off [employer] Chronic Condition Release of info form to be completed. Form placed in Pam's box.

## 2023-11-25 NOTE — Telephone Encounter (Signed)
Form has been completed and faxed to company

## 2023-12-02 ENCOUNTER — Telehealth: Payer: Self-pay | Admitting: Cardiovascular Disease

## 2023-12-02 NOTE — Telephone Encounter (Signed)
 Pt c/o medication issue:  1. Name of Medication: Entresto   2. How are you currently taking this medication (dosage and times per day)?   3. Are you having a reaction (difficulty breathing--STAT)?   4. What is your medication issue? Patient states that these were not received by [employer] and is requesting further info sent to [employer]   [phone]

## 2023-12-05 NOTE — Telephone Encounter (Signed)
 No answer. No voicemail.

## 2023-12-06 ENCOUNTER — Ambulatory Visit: Payer: Medicare Other | Admitting: Cardiovascular Disease

## 2023-12-09 ENCOUNTER — Telehealth: Payer: Self-pay | Admitting: Cardiovascular Disease

## 2023-12-09 MED ORDER — SACUBITRIL-VALSARTAN 24-26 MG PO TABS: 1.0000 | ORAL_TABLET | Freq: Two times a day (BID) | ORAL | Status: DC

## 2023-12-09 NOTE — Addendum Note (Signed)
 Addended by: Jani Gravel on: 12/09/2023 05:26 PM   Modules accepted: Orders

## 2023-12-09 NOTE — Telephone Encounter (Signed)
 Spoke with patient. Patient reports that she has been out of her Entresto 6 weeks. Patient requesting [name] to fax a note to insurance stating that she has chronic heart failure. Last office visit note faxed per patient request. Samples provided for patient to pick up. Patient verbalizes understanding.

## 2023-12-09 NOTE — Telephone Encounter (Signed)
 Patient is calling because [employer] Chronic Condition Release Form has not been sent to off for her to get her Medication

## 2023-12-12 NOTE — Telephone Encounter (Signed)
 See other telephone encounter.

## 2023-12-12 NOTE — Telephone Encounter (Signed)
 Left voicemail message to call back

## 2023-12-13 NOTE — Telephone Encounter (Signed)
 Spoke with patients daughter and she confirms that they have all information needed and now just waiting on them to make the determination. She will call back if she needs any further needs.

## 2023-12-20 ENCOUNTER — Ambulatory Visit: Payer: Medicare Other | Admitting: Cardiovascular Disease

## 2024-01-06 ENCOUNTER — Emergency Department

## 2024-01-06 ENCOUNTER — Telehealth: Payer: Self-pay | Admitting: Cardiovascular Disease

## 2024-01-06 ENCOUNTER — Other Ambulatory Visit: Payer: Self-pay

## 2024-01-06 ENCOUNTER — Emergency Department
Admission: EM | Admit: 2024-01-06 | Discharge: 2024-01-06 | Disposition: A | Discharge status: Home / Self Care | Attending: Emergency Medicine | Admitting: Emergency Medicine

## 2024-01-06 DIAGNOSIS — I160 Hypertensive urgency: Secondary | ICD-10-CM | POA: Diagnosis not present

## 2024-01-06 DIAGNOSIS — I509 Heart failure, unspecified: Secondary | ICD-10-CM | POA: Insufficient documentation

## 2024-01-06 DIAGNOSIS — I110 Hypertensive heart disease with heart failure: Secondary | ICD-10-CM | POA: Insufficient documentation

## 2024-01-06 DIAGNOSIS — R110 Nausea: Secondary | ICD-10-CM

## 2024-01-06 DIAGNOSIS — R531 Weakness: Secondary | ICD-10-CM | POA: Diagnosis present

## 2024-01-06 LAB — URINALYSIS, W/ REFLEX TO CULTURE (INFECTION SUSPECTED)
Bacteria, UA: NONE SEEN
Bilirubin Urine: NEGATIVE
Glucose, UA: NEGATIVE mg/dL
Hgb urine dipstick: NEGATIVE
Ketones, ur: NEGATIVE mg/dL
Leukocytes,Ua: NEGATIVE
Nitrite: NEGATIVE
Protein, ur: NEGATIVE mg/dL
Specific Gravity, Urine: 1.012 (ref 1.005–1.030)
pH: 5 (ref 5.0–8.0)

## 2024-01-06 LAB — BASIC METABOLIC PANEL WITH GFR
Anion gap: 11 (ref 5–15)
BUN: 42 mg/dL — ABNORMAL HIGH (ref 8–23)
CO2: 23 mmol/L (ref 22–32)
Calcium: 9.2 mg/dL (ref 8.9–10.3)
Chloride: 107 mmol/L (ref 98–111)
Creatinine, Ser: 1.73 mg/dL — ABNORMAL HIGH (ref 0.44–1.00)
GFR, Estimated: 28 mL/min — ABNORMAL LOW (ref >60)
Glucose, Bld: 130 mg/dL — ABNORMAL HIGH (ref 70–99)
Potassium: 4.2 mmol/L (ref 3.5–5.1)
Sodium: 141 mmol/L (ref 135–145)

## 2024-01-06 LAB — CBC
HCT: 34.6 % — ABNORMAL LOW (ref 36.0–46.0)
Hemoglobin: 11.6 g/dL — ABNORMAL LOW (ref 12.0–15.0)
MCH: 31.4 pg (ref 26.0–34.0)
MCHC: 33.5 g/dL (ref 30.0–36.0)
MCV: 93.5 fL (ref 80.0–100.0)
Platelets: 146 10*3/uL — ABNORMAL LOW (ref 150–400)
RBC: 3.7 MIL/uL — ABNORMAL LOW (ref 3.87–5.11)
RDW: 13.5 % (ref 11.5–15.5)
WBC: 7.1 10*3/uL (ref 4.0–10.5)
nRBC: 0 % (ref 0.0–0.2)

## 2024-01-06 LAB — TROPONIN I (HIGH SENSITIVITY)
Troponin I (High Sensitivity): 69 ng/L — ABNORMAL HIGH (ref <18)
Troponin I (High Sensitivity): 67 ng/L — ABNORMAL HIGH (ref <18)

## 2024-01-06 NOTE — Discharge Instructions (Signed)
 You were seen in the ER for your elevated blood pressure and nausea.  Your testing fortunately was overall reassuring.  Talk with your primary care doctor and cardiologist about any recommended changes to your blood pressure medication.  Return to the ER for any new or worsening symptoms.

## 2024-01-06 NOTE — Telephone Encounter (Signed)
 Pt granddaughter Marylene Land called in to inform Dr. Mariah Milling pt is on the way to the hospital due to bp being 213/102.

## 2024-01-06 NOTE — Telephone Encounter (Signed)
 Call to Marylene Land - message left

## 2024-01-06 NOTE — Telephone Encounter (Signed)
 Stephanie Charles  returning call to a nurse

## 2024-01-06 NOTE — Telephone Encounter (Signed)
 Call to pt went to voicemail then callback to pt's granddaughter, Stephanie Charles, though she was at work was able to report that pt took additional Hydralazine 50 mg last night (4/3) and "about 90% sure she took another today" because pt was with pt's daughter (4/4)  -- pt was having nausea without vomiting, denies HA  Pt in ED att systolic BP at 178, labs, CXR, and EKG completed and pt waiting in ED  PT wanted to inform Dr. Mariah Milling

## 2024-01-06 NOTE — ED Triage Notes (Signed)
 Pt comes via EMs from home with c/o HTN, nausea and some 12 lead changes. Pt has left bundle branch block/1st degree AV block, multifocal PVC.   18 g   Pt states this started at 10a. Pt was given Asprin by family and pt denies any cp.

## 2024-01-06 NOTE — ED Provider Notes (Signed)
 [facility] Provider Note    Event Date/Time   First MD Initiated Contact with Patient 01/06/24 1726     (approximate)   History   Nausea and Hypertension   HPI  Stephanie Charles is a 89 year old female with history of CHF, HTN, LBBB presenting to the emergency department for evaluation of nausea and elevated blood pressure.  Companied by daughter who provides collateral history.  Earlier today, patient was complaining of nausea with some generalized weakness.  No chest pain or shortness of breath.  No abdominal pain, vomiting, diarrhea.  They checked her blood pressure at home and it got up to 213/102 leading them to present to the ER.  Patient does report that since being in the ER, she has had onset of a generalized headache, not different than prior that she thinks may be related to not eating for several hours.     Physical Exam   Triage Vital Signs: ED Triage Vitals  Encounter Vitals Group     BP 01/06/24 1258 (!) 172/80     Systolic BP Percentile --      Diastolic BP Percentile --      Pulse Rate 01/06/24 1258 76     Resp 01/06/24 1258 19     Temp 01/06/24 1258 98 F (36.7 C)     Temp src --      SpO2 01/06/24 1258 100 %     Weight --      Height --      Head Circumference --      Peak Flow --      Pain Score 01/06/24 1236 0     Pain Loc --      Pain Education --      Exclude from Growth Chart --     Most recent vital signs: Vitals:   01/06/24 1627 01/06/24 1745  BP: (!) 182/70 (!) 179/80  Pulse: 65 73  Resp: 18 17  Temp: 98 F (36.7 C)   SpO2: 95% 94%     General: Awake, interactive  CV:  Regular rate, good peripheral perfusion.  Resp:  Unlabored respirations, lungs good auscultation Abd:  Nondistended, soft, nontender to palpation Neuro:  Alert and oriented, normal extraocular movements, symmetric facial movement, sensation intact over bilateral upper and lower extremities with 5 out of 5 strength.  Normal finger-to-nose  testing.   ED Results / Procedures / Treatments   Labs (all labs ordered are listed, but only abnormal results are displayed) Labs Reviewed  BASIC METABOLIC PANEL WITH GFR - Abnormal; Notable for the following components:      Result Value   Glucose, Bld 130 (*)    BUN 42 (*)    Creatinine, Ser 1.73 (*)    GFR, Estimated 28 (*)    All other components within normal limits  CBC - Abnormal; Notable for the following components:   RBC 3.70 (*)    Hemoglobin 11.6 (*)    HCT 34.6 (*)    Platelets 146 (*)    All other components within normal limits  URINALYSIS, W/ REFLEX TO CULTURE (INFECTION SUSPECTED) - Abnormal; Notable for the following components:   Color, Urine YELLOW (*)    APPearance CLEAR (*)    All other components within normal limits  TROPONIN I (HIGH SENSITIVITY) - Abnormal; Notable for the following components:   Troponin I (High Sensitivity) 69 (*)    All other components within normal limits  TROPONIN I (HIGH SENSITIVITY) - Abnormal;  Notable for the following components:   Troponin I (High Sensitivity) 67 (*)    All other components within normal limits     EKG EKG independently reviewed interpreted by myself (ER attending) demonstrates:  EKG demonstrate sinus rhythm rate 72, PR 234, QRS 138, QTc 459, LBBB present, not new, no appreciable superimposed ischemic changes  RADIOLOGY Imaging independently reviewed and interpreted by myself demonstrates:  CT head without acute bleed Chest x-Jurnei Latini without focal consolidation  PROCEDURES:  Critical Care performed: No  Procedures   MEDICATIONS ORDERED IN ED: Medications - No data to display   IMPRESSION / MDM / ASSESSMENT AND PLAN / ED COURSE  I reviewed the triage vital signs and the nursing notes.  Differential diagnosis includes, but is not limited to, consideration for hypertensive emergency including intracranial bleed, cardiac strain, pulmonary edema though clinical exam less suggestive of this,  consideration for anemia, electrolyte abnormality, low suspicion acute intra-abdominal process in the absence of abdominal pain  Patient's presentation is most consistent with acute presentation with potential threat to life or bodily function.  89 year old female presenting to the emergency department for evaluation of nausea and elevated blood pressure.  BP elevated but improved here without intervention.  Labs with stable anemia and renal dysfunction.  Stable elevated troponin, unchanged on repeat.  Urine without evidence of infection.  Chest x-Stephan Nelis and CT head reassuring.  EKG reassuring.  Discussed results of workup with patient and family.  They are reassured and eager to be discharged home with outpatient follow-up.  With her reassuring workup, do think this is reasonable.  Strict return precautions provided.  Patient discharged stable condition.     FINAL CLINICAL IMPRESSION(S) / ED DIAGNOSES   Final diagnoses:  Hypertensive urgency  Nausea     Rx / DC Orders   ED Discharge Orders     None        Note:  This document was prepared using Dragon voice recognition software and may include unintentional dictation errors.   Trinna Post, MD 01/06/24 [phone]

## 2024-01-09 ENCOUNTER — Telehealth: Payer: Self-pay | Admitting: Cardiovascular Disease

## 2024-01-09 NOTE — Telephone Encounter (Signed)
 Pt would like a c/b regarding medication changes due to recent hospital visit as well as f/u appt. Please advise

## 2024-01-09 NOTE — Telephone Encounter (Signed)
 Returned the call to the patient. She has been advised of the following. She was calling with an update after he ED visit.   Per previous phone note: Antonieta Iba, MD  Jani Gravel, RN2 hours ago (1:44 PM)    Would recommend family continue to monitor blood pressure Nausea symptoms may have triggered hypertensive episode Thx TGollan   Blood pressure this morning after medications was 170/76. Blood pressure this afternoon was 166/70, HR 110. This was after an extra Hydralazine 50 mg. She is asymptomatic.

## 2024-01-10 NOTE — Telephone Encounter (Signed)
 Called and spoke with patient. Patient states that her blood pressure was 168/73 this morning about an hour after medications.  Patient verified that she is currently taking Isosorbide 60 MG twice daily, Carvedilol 3.125 MG twice daily, Entresto 24/26 MG twice daily and Hydralazine 50 MG three times daily.

## 2024-01-10 NOTE — Telephone Encounter (Signed)
 See other telephone encounter.

## 2024-01-11 ENCOUNTER — Telehealth: Payer: Self-pay | Admitting: Cardiovascular Disease

## 2024-01-11 NOTE — Telephone Encounter (Signed)
 Patient called to follow-up on next steps with her BP medication.

## 2024-01-11 NOTE — Telephone Encounter (Signed)
 See other telephone encounter.

## 2024-01-11 NOTE — Telephone Encounter (Signed)
 Patient scheduled to be seen in clinic 01/12/24.

## 2024-01-12 ENCOUNTER — Ambulatory Visit: Attending: Medical | Admitting: Medical

## 2024-01-12 ENCOUNTER — Other Ambulatory Visit: Payer: Self-pay | Admitting: Medical

## 2024-01-12 ENCOUNTER — Encounter: Payer: Self-pay | Admitting: Medical

## 2024-01-12 ENCOUNTER — Other Ambulatory Visit: Payer: Self-pay | Admitting: Cardiovascular Disease

## 2024-01-12 VITALS — BP 168/68 | HR 82 | Ht 61.0 in | Wt 150.4 lb

## 2024-01-12 DIAGNOSIS — I255 Ischemic cardiomyopathy: Secondary | ICD-10-CM

## 2024-01-12 DIAGNOSIS — I6523 Occlusion and stenosis of bilateral carotid arteries: Secondary | ICD-10-CM

## 2024-01-12 DIAGNOSIS — I5022 Chronic systolic (congestive) heart failure: Secondary | ICD-10-CM | POA: Diagnosis not present

## 2024-01-12 DIAGNOSIS — N183 Chronic kidney disease, stage 3 unspecified: Secondary | ICD-10-CM

## 2024-01-12 DIAGNOSIS — I10 Essential (primary) hypertension: Secondary | ICD-10-CM

## 2024-01-12 DIAGNOSIS — I2510 Atherosclerotic heart disease of native coronary artery without angina pectoris: Secondary | ICD-10-CM | POA: Diagnosis not present

## 2024-01-12 DIAGNOSIS — R7989 Other specified abnormal findings of blood chemistry: Secondary | ICD-10-CM

## 2024-01-12 MED ORDER — HYDRALAZINE HCL 100 MG PO TABS: ORAL_TABLET | ORAL | 3 refills | Status: DC

## 2024-01-12 NOTE — Progress Notes (Unsigned)
 Cardiology Office Note:  .   Date:  01/13/2024  ID:  Stephanie Charles, DOB October 23, 1929, MRN [id number] PCP: Stephanie Clos, MD  [facility] Providers Cardiologist:  Stephanie Nordmann, MD     History of Present Illness: .   Stephanie Charles is a 89 y.o. female  with a hx of CAD with prior NSTEMI in 2016 and NSTEMI in 06/2019 s/p PCI/DES to the LAD, HFrEF 2/2 ICM, labile HTN, syncope felt to be vasovagal, TIA/posterior CVA, LBBB, carotid artery disease, hypothyroidism, diverticulitis, C. Dif, CKD stage 3, anemia, gout, anxiety, depression who is being seen for ER follow-up.  Stephanie Charles previously followed with Stephanie Charles and transitioned to [facility] in 2020. She was admitted to [facility] in 2020 with NSTEMI and HTN. Echo showed LVEF 35-40%. Frequenct PVCs noted. R/L heart cath showed mid LAD 95% stenosis s/p successful PCI/DES, D2 30% stenosis. RHC showed mean RA pressure of 8, RV pressure 32/7 with a mean of 9, PA pressure 31/16 with a mean of 21, PCWP 10, CO/CI 3.57/2.02. Echo 09/2019 showed LVEF 40-45%, moderate LVH, G1DD. Echo in 2022 showed LVEF 50-55%.  Echo 10/2021 showed EF 40%, moderate LVH, normal RVSF, frequent PVCs. Outpatient cardiac monitor showed NSR, average 70bpm, BBB, 1 ru NSVT, 3.4% PVC burden.   She was admitted 07/2023 with chest pain that woke her up from sleep with elevated blood pressure.  She was treated with IV heparin x 48 hours.  Echo showed EF of 55 to 60%.  She was restarted on Entresto.  She was felt to be a poor cardiac cath candidate. VQ scan showed no PE.   Patient was last seen in February 2025.  Amlodipine was stopped for lower leg edema.  The patient went to the ER 01/06/24 for HTN and nausea. At home BP was 213/102. BP in the ER 172/80. HS trop 69>67.Labs were stable. CXR and CT head were reassuring. She was discharged home.   Today, the patient feels Ok today. When BP is high she feels headache and nausea. She denies chest pain. She has some SOB with severe activity. She  is still staying active at home.   Studies Reviewed: .        Echo 07/2023 1. Left ventricular ejection fraction, by estimation, is 55 to 60%. The  left ventricle has normal function. The left ventricle has no regional  wall motion abnormalities. There is mild left ventricular hypertrophy.  Left ventricular diastolic parameters  are indeterminate.   2. Right ventricular systolic function is normal. The right ventricular  size is normal.   3. Left atrial size was moderately dilated.   4. The mitral valve is normal in structure. Mild mitral valve  regurgitation.   5. The aortic valve was not well visualized. Aortic valve regurgitation  is trivial.   Carotid US 12/2022  Summary:  Right Carotid: Velocities in the right ICA are consistent with a 1-39%  stenosis.                Non-hemodynamically significant plaque <50% noted in the  CCA. The                 ECA appears >50% stenosed. Tortuous CCA.   Left Carotid: Velocities in the left ICA are consistent with a 1-39%  stenosis.               Non-hemodynamically significant plaque <50% noted in the  CCA. The  ECA appears <50% stenosed.   R/L heart cath 2020 Prox LAD lesion is 90% stenosed. Prox Cx to Mid Cx lesion is 40% stenosed. Prox RCA lesion is 30% stenosed.       Physical Exam:   VS:  BP (!) 168/68 (BP Location: Left Arm, Patient Position: Sitting, Cuff Size: Normal)   Pulse 82   Ht 5\' 1"  (1.549 m)   Wt 150 lb 6.4 oz (68.2 kg)   SpO2 96%   BMI 28.42 kg/m    Wt Readings from Last 3 Encounters:  01/12/24 150 lb 6.4 oz (68.2 kg)  11/08/23 147 lb 8 oz (66.9 kg)  07/31/23 149 lb 14.6 oz (68 kg)    GEN: Well nourished, well developed in no acute distress NECK: No JVD; No carotid bruits CARDIAC: RRR, no murmurs, rubs, gallops RESPIRATORY:  Clear to auscultation without rales, wheezing or rhonchi  ABDOMEN: Soft, non-tender, non-distended EXTREMITIES:  No edema; No deformity   ASSESSMENT AND PLAN: .     HTN Bps recently have been very high with systolics 190-200s. Today BP 168/68. Continue Coreg 3.125mg  BID, Imdur 60mg  BID, Entresto 24-26mg BID. I will increase Hydralazine to 100mg  BID. She did not tolerate amlodipine 2/2 leg swelling. Cannot increase BB with 1st degree AV block/bradcyardia. She will continue to check BP at home. I will order a renal duplex.   Elevated troponin CAD Minimally elevated troponin in the setting of severe HTN. Patient denies chest pain, she has DOE which is unchanged. Discussed noninvasive testing, but she would like to talk to Stephanie Charles before agreeing to anything.   HFrEF/ICM EF as low as 35-40% in 2020. Echo in 2023 showed LVEF 40% with mod LVH. Echo 07/2023 showed LVEF 55-60%. She is euvolemic on exam. Continue Entresto 24-26mg  bID, Coreg 3.125mg  BID, torsemide 20mg  PRN swelling.   Carotid artery stenosis US carotids in 2024 showed 1-39% bilateral stenosis.  CKD Stage 3-4 Scr 1.6-1.8 baseline.       Dispo: Follow-up 1 month with MD  Signed, Stephanie Livengood David Stall, PA-C

## 2024-01-12 NOTE — Patient Instructions (Signed)
 Medication Instructions:  Your physician recommends the following medication changes.  INCREASE: Hydralazine 100 mg three times a day.    *If you need a refill on your cardiac medications before your next appointment, please call your pharmacy*  Lab Work: No labs ordered today   Testing/Procedures:  Your physician has requested that you have a renal artery duplex. During this test, an ultrasound is used to evaluate blood flow to the kidneys. Take your medications as you usually do. This will take place at [address] [facility] [address] [facility], [place] [address].  No food after 11PM the night before.  Water is OK. (Don't drink liquids if you have been instructed not to for ANOTHER test). Avoid foods that produce bowel gas, for 24 hours prior to exam (see below). No breakfast, no chewing gum, no smoking or carbonated beverages. Patient may take morning medications with water. Come in for test at least 15 minutes early to register.  Please note: We ask at that you not bring children with you during ultrasound (echo/ vascular) testing. Due to room size and safety concerns, children are not allowed in the ultrasound rooms during exams. Our front office staff cannot provide observation of children in our lobby area while testing is being conducted. An adult accompanying a patient to their appointment will only be allowed in the ultrasound room at the discretion of the ultrasound technician under special circumstances. We apologize for any inconvenience.   Follow-Up: At [facility], you and your health needs are our priority.  As part of our continuing mission to provide you with exceptional heart care, our providers are all part of one team.  This team includes your primary Cardiologist (physician) and Advanced Practice Providers or APPs (Physician Assistants and Nurse Practitioners) who all work together to provide you with the care you need, when you need it.  Your next  appointment:   1 month(s)  Provider:   You may see Julien Nordmann, MD or one of the following Advanced Practice Providers on your designated Care Team:   Nicolasa Ducking, NP Ames Dura, PA-C Eula Listen, PA-C Cadence Lake Ripley, PA-C Charlsie Quest, NP Carlos Levering, NP    We recommend signing up for the patient portal called "MyChart".  Sign up information is provided on this After Visit Summary.  MyChart is used to connect with patients for Virtual Visits (Telemedicine).  Patients are able to view lab/test results, encounter notes, upcoming appointments, etc.  Non-urgent messages can be sent to your provider as well.   To learn more about what you can do with MyChart, go to [email or web address].

## 2024-02-09 ENCOUNTER — Ambulatory Visit: Attending: Medical

## 2024-02-09 DIAGNOSIS — I10 Essential (primary) hypertension: Secondary | ICD-10-CM

## 2024-02-14 ENCOUNTER — Ambulatory Visit: Admitting: Cardiovascular Disease

## 2024-02-14 NOTE — Progress Notes (Unsigned)
 Cardiology Office Note  Date:  02/17/2024   ID:  Stephanie Charles, DOB 1930-07-13, MRN [id number]  PCP:  Nikki Barters, MD   Chief Complaint  Patient presents with   1 month follow up     Patient has some shortness of breath with over exertion.     HPI:  [name] is a 89 y.o. female with history of  demand ischemia with hypertensive urgency,  LBBB,  TIA/CVA in 2016,  CKD stage II,  HTN,  HLD,  carotid artery disease,  hypothyroidism,  anxiety, depression,  Coronary disease, stent placed to proximal to mid LAD ejection fraction 35 to 40%, repeat 09/2019: 40 to 45% ECHO: January 2023 ejection fraction 40% Ejection fraction normalized October 2024, EF 55 to 60% Who presents for follow-up of her ischemic cardiomyopathy, PAD  Last seen in clinic by myself 2/25 Also seen by one of our providers  April 2025  In the hospital April 2025 nausea, hypertension, weakness, headache Records reviewed in detail Blood pressure 213/102 Workup in the ER negative, was discharged home  Off amlodipine  for leg swelling On follow-up office visit April 2025 blood pressure stable On coreg  3.125 BID, entresto  24/26 BID, hydralazine  100 mg TID, imdur  60 BID  CR 1.73 (stable)  Renal artery US  showed no blockages in the renal arteries   Denies significant chest pain concerning for angina  EKG personally reviewed by myself on todays visit EKG Interpretation Date/Time:  Friday Feb 17 2024 11:59:38 EDT Ventricular Rate:  57 PR Interval:  208 QRS Duration:  154 QT Interval:  472 QTC Calculation: 459 R Axis:   -15  Text Interpretation: Sinus bradycardia with occasional Premature ventricular complexes Left bundle branch block When compared with ECG of 06-Jan-2024 12:57, Premature ventricular complexes are now Present Confirmed by Belva Boyden [phone]) on 02/17/2024 12:01:10 PM   Past medical history reviewed hospital October 2024 non-STEMI, CHF Presented to the hospital with chest  pain, hypertension VQ scan with no PE  echo detailing ejection fraction 50 to 55% Was treated with heparin  infusion 48 hours Was felt not a candidate for ischemic workup given age, debility, medical management recommended Entresto , torsemide  Coreg  held at discharge, beta-blocker held for bradycardia   C.diff better , no recent diverticulitis after long course of antibiotics 02/2022  Covid, in hospital,March 06 2021 BP elevated on arrival to the hospital Creatinine 0.9 on arrival,  Treated with diuretic for shortness of breath, concern for CHF Sent home 03/10/2021,d/c with CR 1.6, BUN 92, above her baseline  After discharge, had episode of syncope on toilet in the setting of diarrhea on 03/11/21 Presented to the hospital again with dehydration multiple episodes of profuse watery diarrhea bradycardic into the 40s, given 0.5 mg of atropine  in the ER Creatinine 2.12, BUN 100 Given IV fluids  hospital September 2020, ejection fraction 35 to 40% She had symptoms of chest pain and accelerated HTN found to have elevated high sensitivity troponin peaking at 296 and acute systolic CHF.    cardiac catheterization, with severe proximal to mid LAD disease, stent placed  Follow-up echocardiogram December 2020 ejection fraction 40 up to 45% Tolerating Entresto , carvedilol , HCTZ Reports blood pressure stable at home  PMH:   has a past medical history of Anemia, C. difficile diarrhea, CHF (congestive heart failure) (HCC) (10/05/2021), Diverticulitis, GERD (gastroesophageal reflux disease), Hyperlipidemia, Hypertension, Hypothyroidism, Myocardial infarction (HCC) (08/2017), TIA (transient ischemic attack), Vertigo, and [name].  PSH:    Past Surgical  History:  Procedure Laterality Date   COLONOSCOPY WITH PROPOFOL  N/A 06/22/2018   Procedure: COLONOSCOPY WITH PROPOFOL ;  Surgeon: Luke Salaam, MD;  Location: [facility] ENDOSCOPY;  Service: Endoscopy;  Laterality: N/A;   CORONARY STENT  INTERVENTION N/A 06/07/2019   Procedure: CORONARY STENT INTERVENTION;  Surgeon: Wenona Hamilton, MD;  Location: [facility] INVASIVE CV LAB;  Service: Cardiovascular;  Laterality: N/A;   ESOPHAGOGASTRODUODENOSCOPY (EGD) WITH PROPOFOL  N/A 06/22/2018   Procedure: ESOPHAGOGASTRODUODENOSCOPY (EGD) WITH PROPOFOL ;  Surgeon: Luke Salaam, MD;  Location: [facility] ENDOSCOPY;  Service: Endoscopy;  Laterality: N/A;   KNEE ARTHROSCOPY Right    REPLACEMENT TOTAL KNEE BILATERAL     RIGHT/LEFT HEART CATH AND CORONARY ANGIOGRAPHY N/A 06/07/2019   Procedure: RIGHT/LEFT HEART CATH AND CORONARY ANGIOGRAPHY;  Surgeon: Devorah Fonder, MD;  Location: [facility] INVASIVE CV LAB;  Service: Cardiovascular;  Laterality: N/A;   SHOULDER SURGERY Left     Current Outpatient Medications  Medication Sig Dispense Refill   albuterol  (PROVENTIL ) (2.5 MG/3ML) 0.083% nebulizer solution USE 1 VIAL IN NEBULIZER EVERY 4 HOURS AS NEEDED 180 mL 6   allopurinol  (ZYLOPRIM ) 300 MG tablet Take 150 mg by mouth daily.     amitriptyline  (ELAVIL ) 10 MG tablet Take 1 tablet (10 mg total) by mouth at bedtime. 90 tablet 3   ASPIRIN  LOW DOSE 81 MG EC tablet NEW PRESCRIPTION REQUEST: TAKE ONE TABLET BY MOUTH EVERY DAY 90 tablet 3   atorvastatin  (LIPITOR) 40 MG tablet Take 1 tablet by mouth once daily 90 tablet 3   carvedilol  (COREG ) 3.125 MG tablet Take 1 tablet (3.125 mg total) by mouth 2 (two) times daily with a meal. 180 tablet 3   clopidogrel  (PLAVIX ) 75 MG tablet Take 1 tablet by mouth once daily 90 tablet 3   cyclobenzaprine (FLEXERIL) 5 MG tablet Take 5 mg by mouth 3 (three) times daily as needed.     Ferrous Sulfate  27 MG TABS Take 65 mg by mouth at bedtime.     fluorometholone (FML) 0.1 % ophthalmic suspension Place 1 drop into both eyes 2 (two) times daily.     gabapentin (NEURONTIN) 100 MG capsule Take 1 capsule by mouth at bedtime.     hydrALAZINE  (APRESOLINE ) 100 MG tablet Take 1 tablet (100 mg total) by mouth 3 (three) times daily. TAKE 1 TABLET BY  MOUTH THREE TIMES DAILY FOR HIGH BLOOD PRESSURE 270 tablet 1   isosorbide  mononitrate (IMDUR ) 60 MG 24 hr tablet Take 1 tablet (60 mg total) by mouth 2 (two) times daily. 180 tablet 3   levothyroxine  (SYNTHROID ) 137 MCG tablet Take 1 tablet (137 mcg total) by mouth daily before breakfast. 90 tablet 3   Melatonin 3 MG TBDP Take 1 tablet by mouth at bedtime.     mirtazapine  (REMERON ) 30 MG tablet TAKE 1 TABLET BY MOUTH AT BEDTIME 90 tablet 3   Multiple Vitamin (MULTIVITAMIN) capsule Take 1 capsule by mouth every evening.     nitroGLYCERIN  (NITROSTAT ) 0.4 MG SL tablet Place 1 tablet (0.4 mg total) under the tongue every 5 (five) minutes as needed for chest pain. 25 tablet 3   pantoprazole  (PROTONIX ) 40 MG tablet Take 1 tablet (40 mg total) by mouth daily. 90 tablet 3   potassium chloride  (KLOR-CON ) 10 MEQ tablet Take 1 tablet (10 mEq total) by mouth daily as needed (when you take torsemide ). 90 tablet 3   sacubitril -valsartan  (ENTRESTO ) 24-26 MG Take 1 tablet by mouth 2 (two) times daily. 56 tablet    sertraline  (ZOLOFT ) 25 MG tablet  Take 1 tablet by mouth once daily 90 tablet 3   torsemide  (DEMADEX ) 20 MG tablet Take 20 mg by mouth as needed.     No current facility-administered medications for this visit.    Allergies:   Codeine, Levofloxacin, Lisinopril, Other, Povidone-iodine, Simvastatin, and Latex   Social History:  The patient  reports that she has never smoked. She has never used smokeless tobacco. She reports that she does not drink alcohol and does not use drugs.   Family History:   family history includes Alzheimer's disease in her brother and brother; Breast cancer in her sister; Cancer in her brother; Clotting disorder in her daughter; Fibromyalgia in her daughter; Heart attack in her brother; Heart disease in her brother, brother, father, and mother; Hypertension in her mother; Mental illness in her brother; Stroke in her brother and mother.    Review of Systems: Review of Systems   Constitutional: Negative.   HENT: Negative.    Respiratory: Negative.    Cardiovascular: Negative.   Gastrointestinal: Negative.   Musculoskeletal: Negative.   Neurological: Negative.   Psychiatric/Behavioral: Negative.    All other systems reviewed and are negative.  PHYSICAL EXAM: VS:  BP 130/60 (BP Location: Left Arm, Patient Position: Sitting, Cuff Size: Normal)   Pulse 65   Ht 5\' 1"  (1.549 m)   Wt 145 lb (65.8 kg)   SpO2 97%   BMI 27.40 kg/m  , BMI Body mass index is 27.4 kg/m. Constitutional:  oriented to person, place, and time. No distress.  HENT:  Head: Grossly normal Eyes:  no discharge. No scleral icterus.  Neck: No JVD, no carotid bruits  Cardiovascular: Regular rate and rhythm, no murmurs appreciated Pulmonary/Chest: Clear to auscultation bilaterally, no wheezes or rales Abdominal: Soft.  no distension.  no tenderness.  Musculoskeletal: Normal range of motion Neurological:  normal muscle tone. Coordination normal. No atrophy Skin: Skin warm and dry Psychiatric: normal affect, pleasant  Recent Labs: 07/28/2023: B Natriuretic Peptide 407.5; Magnesium  2.0; TSH 0.549 07/29/2023: ALT 13 01/06/2024: BUN 42; Creatinine, Ser 1.73; Hemoglobin 11.6; Platelets 146; Potassium 4.2; Sodium 141   Lipid Panel Lab Results  Component Value Date   CHOL 90 07/28/2023   HDL 29 (L) 07/28/2023   LDLCALC 51 07/28/2023   TRIG 49 07/28/2023    Wt Readings from Last 3 Encounters:  02/17/24 145 lb (65.8 kg)  01/12/24 150 lb 6.4 oz (68.2 kg)  11/08/23 147 lb 8 oz (66.9 kg)     ASSESSMENT AND PLAN:  Problem List Items Addressed This Visit       Cardiology Problems   CAD (coronary artery disease) - Primary   Relevant Orders   EKG 12-Lead (Completed)   Ischemic cardiomyopathy   Relevant Orders   EKG 12-Lead (Completed)   HFrEF (heart failure with reduced ejection fraction) (HCC)   Carotid stenosis   Other Visit Diagnoses       Chronic systolic heart failure (HCC)        Relevant Orders   EKG 12-Lead (Completed)     Stage 3 chronic kidney disease, unspecified whether stage 3a or 3b CKD (HCC)         Hyperlipidemia LDL goal <70         Labile hypertension          Ischemic cardiomyopathy Tolerating  Entresto ,  carvedilol , hydralazine  3 times daily Isosorbide  twice daily - Blood pressure well-controlled Denies chest pain concerning for angina No medication changes made Given age and debility felt not  to be a candidate for further invasive ischemic studies such as cardiac catheterization  CAD with stable angina History of stent placed to her proximal to mid LAD Ejection fraction has normalized Hospitalization October 2024 non-STEMI, decision made to manage medically Continue isosorbide , aspirin  and statin Denies unstable angina symptoms  Carotid bruit Mild disease bilaterally, less than 39%  Hyperlipidemia On Lipitor 40 daily, goal LDL less than 70  Chronic renal sufficiency stage III Stable renal function, creatinine 1.5  Essential hypertension Blood pressure is well controlled on today's visit. No changes made to the medications.   Signed, Juanda Noon, M.D., Ph.D. [facility] [place], [place] [phone]

## 2024-02-16 ENCOUNTER — Ambulatory Visit: Payer: Self-pay | Admitting: Medical

## 2024-02-17 ENCOUNTER — Encounter: Payer: Self-pay | Admitting: Cardiovascular Disease

## 2024-02-17 ENCOUNTER — Ambulatory Visit: Attending: Cardiovascular Disease | Admitting: Cardiovascular Disease

## 2024-02-17 VITALS — BP 130/60 | HR 57 | Ht 61.0 in | Wt 145.0 lb

## 2024-02-17 DIAGNOSIS — I255 Ischemic cardiomyopathy: Secondary | ICD-10-CM | POA: Diagnosis not present

## 2024-02-17 DIAGNOSIS — I6523 Occlusion and stenosis of bilateral carotid arteries: Secondary | ICD-10-CM | POA: Diagnosis not present

## 2024-02-17 DIAGNOSIS — I5022 Chronic systolic (congestive) heart failure: Secondary | ICD-10-CM | POA: Diagnosis not present

## 2024-02-17 DIAGNOSIS — N183 Chronic kidney disease, stage 3 unspecified: Secondary | ICD-10-CM

## 2024-02-17 DIAGNOSIS — I5020 Unspecified systolic (congestive) heart failure: Secondary | ICD-10-CM

## 2024-02-17 DIAGNOSIS — R0989 Other specified symptoms and signs involving the circulatory and respiratory systems: Secondary | ICD-10-CM

## 2024-02-17 DIAGNOSIS — E785 Hyperlipidemia, unspecified: Secondary | ICD-10-CM

## 2024-02-17 DIAGNOSIS — I2510 Atherosclerotic heart disease of native coronary artery without angina pectoris: Secondary | ICD-10-CM

## 2024-02-17 NOTE — Patient Instructions (Signed)

## 2024-03-21 ENCOUNTER — Other Ambulatory Visit: Payer: Self-pay | Admitting: Cardiovascular Disease

## 2024-04-05 ENCOUNTER — Other Ambulatory Visit: Payer: Self-pay | Admitting: Cardiovascular Disease

## 2024-04-05 DIAGNOSIS — Z8673 Personal history of transient ischemic attack (TIA), and cerebral infarction without residual deficits: Secondary | ICD-10-CM

## 2024-04-08 ENCOUNTER — Inpatient Hospital Stay
Admission: EM | Admit: 2024-04-08 | Discharge: 2024-04-12 | DRG: 291 | Disposition: A | Discharge status: Home / Self Care | Attending: Internal Medicine | Admitting: Internal Medicine

## 2024-04-08 ENCOUNTER — Emergency Department

## 2024-04-08 DIAGNOSIS — I5043 Acute on chronic combined systolic (congestive) and diastolic (congestive) heart failure: Secondary | ICD-10-CM | POA: Diagnosis present

## 2024-04-08 DIAGNOSIS — I5020 Unspecified systolic (congestive) heart failure: Secondary | ICD-10-CM | POA: Diagnosis present

## 2024-04-08 DIAGNOSIS — N179 Acute kidney failure, unspecified: Secondary | ICD-10-CM | POA: Diagnosis present

## 2024-04-08 DIAGNOSIS — F32A Depression, unspecified: Secondary | ICD-10-CM | POA: Diagnosis present

## 2024-04-08 DIAGNOSIS — I161 Hypertensive emergency: Secondary | ICD-10-CM | POA: Diagnosis present

## 2024-04-08 DIAGNOSIS — R0602 Shortness of breath: Secondary | ICD-10-CM | POA: Diagnosis present

## 2024-04-08 DIAGNOSIS — I493 Ventricular premature depolarization: Secondary | ICD-10-CM | POA: Diagnosis present

## 2024-04-08 DIAGNOSIS — R079 Chest pain, unspecified: Secondary | ICD-10-CM | POA: Insufficient documentation

## 2024-04-08 DIAGNOSIS — R011 Cardiac murmur, unspecified: Secondary | ICD-10-CM | POA: Diagnosis present

## 2024-04-08 DIAGNOSIS — I255 Ischemic cardiomyopathy: Secondary | ICD-10-CM | POA: Diagnosis present

## 2024-04-08 DIAGNOSIS — J810 Acute pulmonary edema: Secondary | ICD-10-CM | POA: Diagnosis not present

## 2024-04-08 DIAGNOSIS — J9601 Acute respiratory failure with hypoxia: Secondary | ICD-10-CM | POA: Diagnosis present

## 2024-04-08 DIAGNOSIS — Z91040 Latex allergy status: Secondary | ICD-10-CM

## 2024-04-08 DIAGNOSIS — Z1152 Encounter for screening for COVID-19: Secondary | ICD-10-CM | POA: Diagnosis not present

## 2024-04-08 DIAGNOSIS — Z7902 Long term (current) use of antithrombotics/antiplatelets: Secondary | ICD-10-CM

## 2024-04-08 DIAGNOSIS — K219 Gastro-esophageal reflux disease without esophagitis: Secondary | ICD-10-CM | POA: Diagnosis present

## 2024-04-08 DIAGNOSIS — Z79890 Hormone replacement therapy (postmenopausal): Secondary | ICD-10-CM

## 2024-04-08 DIAGNOSIS — D696 Thrombocytopenia, unspecified: Secondary | ICD-10-CM | POA: Diagnosis present

## 2024-04-08 DIAGNOSIS — Z8249 Family history of ischemic heart disease and other diseases of the circulatory system: Secondary | ICD-10-CM

## 2024-04-08 DIAGNOSIS — I440 Atrioventricular block, first degree: Secondary | ICD-10-CM | POA: Diagnosis present

## 2024-04-08 DIAGNOSIS — I10 Essential (primary) hypertension: Secondary | ICD-10-CM | POA: Diagnosis present

## 2024-04-08 DIAGNOSIS — R7989 Other specified abnormal findings of blood chemistry: Secondary | ICD-10-CM

## 2024-04-08 DIAGNOSIS — E876 Hypokalemia: Secondary | ICD-10-CM | POA: Diagnosis present

## 2024-04-08 DIAGNOSIS — Z96653 Presence of artificial knee joint, bilateral: Secondary | ICD-10-CM | POA: Diagnosis present

## 2024-04-08 DIAGNOSIS — Z955 Presence of coronary angioplasty implant and graft: Secondary | ICD-10-CM

## 2024-04-08 DIAGNOSIS — I2510 Atherosclerotic heart disease of native coronary artery without angina pectoris: Secondary | ICD-10-CM | POA: Diagnosis present

## 2024-04-08 DIAGNOSIS — R0789 Other chest pain: Secondary | ICD-10-CM

## 2024-04-08 DIAGNOSIS — I6523 Occlusion and stenosis of bilateral carotid arteries: Secondary | ICD-10-CM | POA: Diagnosis present

## 2024-04-08 DIAGNOSIS — I160 Hypertensive urgency: Secondary | ICD-10-CM | POA: Diagnosis not present

## 2024-04-08 DIAGNOSIS — Z8619 Personal history of other infectious and parasitic diseases: Secondary | ICD-10-CM

## 2024-04-08 DIAGNOSIS — I501 Left ventricular failure: Secondary | ICD-10-CM | POA: Diagnosis present

## 2024-04-08 DIAGNOSIS — R001 Bradycardia, unspecified: Secondary | ICD-10-CM | POA: Diagnosis present

## 2024-04-08 DIAGNOSIS — I447 Left bundle-branch block, unspecified: Secondary | ICD-10-CM | POA: Diagnosis present

## 2024-04-08 DIAGNOSIS — Z79899 Other long term (current) drug therapy: Secondary | ICD-10-CM | POA: Diagnosis not present

## 2024-04-08 DIAGNOSIS — I472 Ventricular tachycardia, unspecified: Secondary | ICD-10-CM | POA: Diagnosis present

## 2024-04-08 DIAGNOSIS — I130 Hypertensive heart and chronic kidney disease with heart failure and stage 1 through stage 4 chronic kidney disease, or unspecified chronic kidney disease: Secondary | ICD-10-CM | POA: Diagnosis present

## 2024-04-08 DIAGNOSIS — N184 Chronic kidney disease, stage 4 (severe): Secondary | ICD-10-CM | POA: Diagnosis present

## 2024-04-08 DIAGNOSIS — I2489 Other forms of acute ischemic heart disease: Secondary | ICD-10-CM | POA: Diagnosis present

## 2024-04-08 DIAGNOSIS — I252 Old myocardial infarction: Secondary | ICD-10-CM

## 2024-04-08 DIAGNOSIS — Z883 Allergy status to other anti-infective agents status: Secondary | ICD-10-CM

## 2024-04-08 DIAGNOSIS — Z881 Allergy status to other antibiotic agents status: Secondary | ICD-10-CM

## 2024-04-08 DIAGNOSIS — R5381 Other malaise: Secondary | ICD-10-CM | POA: Diagnosis present

## 2024-04-08 DIAGNOSIS — N178 Other acute kidney failure: Secondary | ICD-10-CM | POA: Diagnosis not present

## 2024-04-08 DIAGNOSIS — Z8673 Personal history of transient ischemic attack (TIA), and cerebral infarction without residual deficits: Secondary | ICD-10-CM

## 2024-04-08 DIAGNOSIS — Z885 Allergy status to narcotic agent status: Secondary | ICD-10-CM

## 2024-04-08 DIAGNOSIS — Z66 Do not resuscitate: Secondary | ICD-10-CM | POA: Diagnosis present

## 2024-04-08 DIAGNOSIS — E039 Hypothyroidism, unspecified: Secondary | ICD-10-CM | POA: Diagnosis present

## 2024-04-08 DIAGNOSIS — E785 Hyperlipidemia, unspecified: Secondary | ICD-10-CM | POA: Diagnosis present

## 2024-04-08 DIAGNOSIS — H109 Unspecified conjunctivitis: Secondary | ICD-10-CM | POA: Diagnosis present

## 2024-04-08 DIAGNOSIS — I429 Cardiomyopathy, unspecified: Secondary | ICD-10-CM | POA: Diagnosis not present

## 2024-04-08 DIAGNOSIS — Z9181 History of falling: Secondary | ICD-10-CM

## 2024-04-08 DIAGNOSIS — Z823 Family history of stroke: Secondary | ICD-10-CM

## 2024-04-08 DIAGNOSIS — Z974 Presence of external hearing-aid: Secondary | ICD-10-CM

## 2024-04-08 DIAGNOSIS — E782 Mixed hyperlipidemia: Secondary | ICD-10-CM | POA: Diagnosis present

## 2024-04-08 DIAGNOSIS — Z7982 Long term (current) use of aspirin: Secondary | ICD-10-CM

## 2024-04-08 DIAGNOSIS — A419 Sepsis, unspecified organism: Secondary | ICD-10-CM | POA: Diagnosis present

## 2024-04-08 DIAGNOSIS — Z888 Allergy status to other drugs, medicaments and biological substances status: Secondary | ICD-10-CM

## 2024-04-08 DIAGNOSIS — F419 Anxiety disorder, unspecified: Secondary | ICD-10-CM | POA: Diagnosis present

## 2024-04-08 LAB — CBC WITH DIFFERENTIAL/PLATELET
Abs Immature Granulocytes: 0.05 K/uL (ref 0.00–0.07)
Basophils Absolute: 0 K/uL (ref 0.0–0.1)
Basophils Relative: 0 %
Eosinophils Absolute: 0 K/uL (ref 0.0–0.5)
Eosinophils Relative: 0 %
HCT: 36.2 % (ref 36.0–46.0)
Hemoglobin: 12 g/dL (ref 12.0–15.0)
Immature Granulocytes: 0 %
Lymphocytes Relative: 4 %
Lymphs Abs: 0.5 K/uL — ABNORMAL LOW (ref 0.7–4.0)
MCH: 31.6 pg (ref 26.0–34.0)
MCHC: 33.1 g/dL (ref 30.0–36.0)
MCV: 95.3 fL (ref 80.0–100.0)
Monocytes Absolute: 0.3 K/uL (ref 0.1–1.0)
Monocytes Relative: 2 %
Neutro Abs: 11 K/uL — ABNORMAL HIGH (ref 1.7–7.7)
Neutrophils Relative %: 94 %
Platelets: 143 K/uL — ABNORMAL LOW (ref 150–400)
RBC: 3.8 MIL/uL — ABNORMAL LOW (ref 3.87–5.11)
RDW: 13.4 % (ref 11.5–15.5)
WBC: 11.9 K/uL — ABNORMAL HIGH (ref 4.0–10.5)
nRBC: 0 % (ref 0.0–0.2)

## 2024-04-08 LAB — COMPREHENSIVE METABOLIC PANEL WITH GFR
ALT: 26 U/L (ref 0–44)
AST: 33 U/L (ref 15–41)
Albumin: 4.2 g/dL (ref 3.5–5.0)
Alkaline Phosphatase: 104 U/L (ref 38–126)
Anion gap: 12 (ref 5–15)
BUN: 53 mg/dL — ABNORMAL HIGH (ref 8–23)
CO2: 23 mmol/L (ref 22–32)
Calcium: 9.3 mg/dL (ref 8.9–10.3)
Chloride: 102 mmol/L (ref 98–111)
Creatinine, Ser: 1.92 mg/dL — ABNORMAL HIGH (ref 0.44–1.00)
GFR, Estimated: 24 mL/min — ABNORMAL LOW (ref >60)
Glucose, Bld: 137 mg/dL — ABNORMAL HIGH (ref 70–99)
Potassium: 3.5 mmol/L (ref 3.5–5.1)
Sodium: 137 mmol/L (ref 135–145)
Total Bilirubin: 0.6 mg/dL (ref 0.0–1.2)
Total Protein: 7.3 g/dL (ref 6.5–8.1)

## 2024-04-08 LAB — URINALYSIS, W/ REFLEX TO CULTURE (INFECTION SUSPECTED)
Bacteria, UA: NONE SEEN
Bilirubin Urine: NEGATIVE
Glucose, UA: NEGATIVE mg/dL
Hgb urine dipstick: NEGATIVE
Ketones, ur: NEGATIVE mg/dL
Leukocytes,Ua: NEGATIVE
Nitrite: NEGATIVE
Protein, ur: NEGATIVE mg/dL
Specific Gravity, Urine: 1.012 (ref 1.005–1.030)
Squamous Epithelial / HPF: 0 /HPF (ref 0–5)
pH: 5 (ref 5.0–8.0)

## 2024-04-08 LAB — LACTIC ACID, PLASMA
Lactic Acid, Venous: 1.5 mmol/L (ref 0.5–1.9)
Lactic Acid, Venous: 1.9 mmol/L (ref 0.5–1.9)

## 2024-04-08 LAB — LIPASE, BLOOD: Lipase: 137 U/L — ABNORMAL HIGH (ref 11–51)

## 2024-04-08 LAB — BRAIN NATRIURETIC PEPTIDE: B Natriuretic Peptide: 359.3 pg/mL — ABNORMAL HIGH (ref 0.0–100.0)

## 2024-04-08 LAB — RESP PANEL BY RT-PCR (RSV, FLU A&B, COVID)  RVPGX2
Influenza A by PCR: NEGATIVE
Influenza B by PCR: NEGATIVE
Resp Syncytial Virus by PCR: NEGATIVE
SARS Coronavirus 2 by RT PCR: NEGATIVE

## 2024-04-08 LAB — TROPONIN I (HIGH SENSITIVITY)
Troponin I (High Sensitivity): 80 ng/L — ABNORMAL HIGH (ref <18)
Troponin I (High Sensitivity): 103 ng/L (ref <18)

## 2024-04-08 LAB — PROTIME-INR
INR: 1 (ref 0.8–1.2)
Prothrombin Time: 13.3 s (ref 11.4–15.2)

## 2024-04-08 LAB — PROCALCITONIN: Procalcitonin: 0.52 ng/mL

## 2024-04-08 MED ORDER — ISOSORBIDE MONONITRATE ER 30 MG PO TB24
30.0000 mg | ORAL_TABLET | Freq: Every day | ORAL | Status: DC
  Administered 2024-04-08 – 2024-04-10 (×3): 30 mg via ORAL
  Filled 2024-04-08 (×3): qty 1

## 2024-04-08 MED ORDER — CYCLOBENZAPRINE HCL 10 MG PO TABS
5.0000 mg | ORAL_TABLET | Freq: Three times a day (TID) | ORAL | Status: DC | PRN
  Filled 2024-04-08: qty 1

## 2024-04-08 MED ORDER — SERTRALINE HCL 50 MG PO TABS
25.0000 mg | ORAL_TABLET | Freq: Every day | ORAL | Status: DC
  Administered 2024-04-08 – 2024-04-12 (×5): 25 mg via ORAL
  Filled 2024-04-08 (×4): qty 1

## 2024-04-08 MED ORDER — CARVEDILOL 6.25 MG PO TABS
3.1250 mg | ORAL_TABLET | Freq: Two times a day (BID) | ORAL | Status: DC
  Administered 2024-04-08 – 2024-04-12 (×7): 3.125 mg via ORAL
  Filled 2024-04-08 (×8): qty 1

## 2024-04-08 MED ORDER — AMITRIPTYLINE HCL 10 MG PO TABS
10.0000 mg | ORAL_TABLET | Freq: Every day | ORAL | Status: DC
  Administered 2024-04-08 – 2024-04-11 (×4): 10 mg via ORAL
  Filled 2024-04-08 (×4): qty 1

## 2024-04-08 MED ORDER — FUROSEMIDE 10 MG/ML IJ SOLN
20.0000 mg | Freq: Once | INTRAMUSCULAR | Status: AC
  Administered 2024-04-08: 20 mg via INTRAVENOUS
  Filled 2024-04-08: qty 4

## 2024-04-08 MED ORDER — POLYETHYLENE GLYCOL 3350 17 G PO PACK
17.0000 g | PACK | Freq: Every day | ORAL | Status: DC | PRN
Start: 2024-04-08 — End: 2024-04-12

## 2024-04-08 MED ORDER — FERROUS SULFATE 325 (65 FE) MG PO TABS
325.0000 mg | ORAL_TABLET | Freq: Every day | ORAL | Status: DC
  Administered 2024-04-08 – 2024-04-11 (×4): 325 mg via ORAL
  Filled 2024-04-08 (×4): qty 1

## 2024-04-08 MED ORDER — PANTOPRAZOLE SODIUM 40 MG PO TBEC
40.0000 mg | DELAYED_RELEASE_TABLET | Freq: Every day | ORAL | Status: DC
  Administered 2024-04-08 – 2024-04-12 (×5): 40 mg via ORAL
  Filled 2024-04-08 (×4): qty 1

## 2024-04-08 MED ORDER — POTASSIUM CHLORIDE ER 10 MEQ PO TBCR: 10.0000 meq | EXTENDED_RELEASE_TABLET | Freq: Every day | ORAL | Status: DC | PRN

## 2024-04-08 MED ORDER — LIDOCAINE 5 % EX PTCH
1.0000 | MEDICATED_PATCH | CUTANEOUS | Status: DC
  Administered 2024-04-08 – 2024-04-12 (×5): 1 via TRANSDERMAL
  Filled 2024-04-08 (×5): qty 1

## 2024-04-08 MED ORDER — GABAPENTIN 100 MG PO CAPS
100.0000 mg | ORAL_CAPSULE | Freq: Every day | ORAL | Status: DC
  Administered 2024-04-08 – 2024-04-11 (×4): 100 mg via ORAL
  Filled 2024-04-08 (×4): qty 1

## 2024-04-08 MED ORDER — NITROGLYCERIN IN D5W 200-5 MCG/ML-% IV SOLN
0.0000 ug/min | INTRAVENOUS | Status: DC
  Administered 2024-04-08: 50 ug/min via INTRAVENOUS
  Administered 2024-04-08: 5 ug/min via INTRAVENOUS
  Filled 2024-04-08: qty 250

## 2024-04-08 MED ORDER — ALBUTEROL SULFATE (2.5 MG/3ML) 0.083% IN NEBU: 2.5000 mg | INHALATION_SOLUTION | RESPIRATORY_TRACT | Status: DC | PRN

## 2024-04-08 MED ORDER — TOBRAMYCIN 0.3 % OP SOLN
1.0000 [drp] | OPHTHALMIC | Status: DC
  Administered 2024-04-08 – 2024-04-10 (×11): 1 [drp] via OPHTHALMIC
  Filled 2024-04-08: qty 5

## 2024-04-08 MED ORDER — ACETAMINOPHEN 325 MG PO TABS
650.0000 mg | ORAL_TABLET | Freq: Four times a day (QID) | ORAL | Status: DC | PRN
Start: 2024-04-08 — End: 2024-04-09
  Administered 2024-04-08: 650 mg via ORAL
  Filled 2024-04-08 (×3): qty 2

## 2024-04-08 MED ORDER — MIRTAZAPINE 15 MG PO TABS
30.0000 mg | ORAL_TABLET | Freq: Every day | ORAL | Status: DC
  Administered 2024-04-08 – 2024-04-11 (×4): 30 mg via ORAL
  Filled 2024-04-08 (×4): qty 2

## 2024-04-08 MED ORDER — ACETAMINOPHEN 500 MG PO TABS
1000.0000 mg | ORAL_TABLET | Freq: Once | ORAL | Status: AC
  Administered 2024-04-08: 1000 mg via ORAL
  Filled 2024-04-08: qty 2

## 2024-04-08 MED ORDER — VANCOMYCIN HCL 1250 MG/250ML IV SOLN
1250.0000 mg | Freq: Once | INTRAVENOUS | Status: AC
  Administered 2024-04-08: 1250 mg via INTRAVENOUS
  Filled 2024-04-08: qty 250

## 2024-04-08 MED ORDER — ADULT MULTIVITAMIN W/MINERALS CH
1.0000 | ORAL_TABLET | Freq: Every evening | ORAL | Status: DC
  Administered 2024-04-09 – 2024-04-11 (×2): 1 via ORAL
  Filled 2024-04-08 (×4): qty 1

## 2024-04-08 MED ORDER — PIPERACILLIN-TAZOBACTAM 3.375 G IVPB
3.3750 g | Freq: Three times a day (TID) | INTRAVENOUS | Status: DC
  Administered 2024-04-08 – 2024-04-09 (×3): 3.375 g via INTRAVENOUS
  Filled 2024-04-08 (×3): qty 50

## 2024-04-08 MED ORDER — ASPIRIN 81 MG PO TBEC
81.0000 mg | DELAYED_RELEASE_TABLET | Freq: Every day | ORAL | Status: DC
  Administered 2024-04-08 – 2024-04-12 (×5): 81 mg via ORAL
  Filled 2024-04-08 (×5): qty 1

## 2024-04-08 MED ORDER — FLUOROMETHOLONE 0.1 % OP SUSP
1.0000 [drp] | Freq: Two times a day (BID) | OPHTHALMIC | Status: DC
  Administered 2024-04-08 – 2024-04-12 (×9): 1 [drp] via OPHTHALMIC
  Filled 2024-04-08: qty 5

## 2024-04-08 MED ORDER — MELATONIN 5 MG PO TABS
5.0000 mg | ORAL_TABLET | Freq: Every day | ORAL | Status: DC
  Administered 2024-04-08 – 2024-04-11 (×4): 5 mg via ORAL
  Filled 2024-04-08 (×4): qty 1

## 2024-04-08 MED ORDER — ATORVASTATIN CALCIUM 20 MG PO TABS
40.0000 mg | ORAL_TABLET | Freq: Every day | ORAL | Status: DC
  Administered 2024-04-09 – 2024-04-12 (×4): 40 mg via ORAL
  Filled 2024-04-08 (×4): qty 2

## 2024-04-08 MED ORDER — ACETAMINOPHEN 650 MG RE SUPP: 650.0000 mg | Freq: Four times a day (QID) | RECTAL | Status: DC | PRN

## 2024-04-08 MED ORDER — SACUBITRIL-VALSARTAN 24-26 MG PO TABS
1.0000 | ORAL_TABLET | Freq: Two times a day (BID) | ORAL | Status: DC
  Administered 2024-04-08 – 2024-04-09 (×4): 1 via ORAL
  Filled 2024-04-08 (×4): qty 1

## 2024-04-08 MED ORDER — HYDRALAZINE HCL 10 MG PO TABS
10.0000 mg | ORAL_TABLET | Freq: Three times a day (TID) | ORAL | Status: DC
  Administered 2024-04-08 – 2024-04-10 (×8): 10 mg via ORAL
  Filled 2024-04-08 (×8): qty 1

## 2024-04-08 MED ORDER — FUROSEMIDE 10 MG/ML IJ SOLN
20.0000 mg | Freq: Two times a day (BID) | INTRAMUSCULAR | Status: DC
  Administered 2024-04-08 – 2024-04-09 (×2): 20 mg via INTRAVENOUS
  Filled 2024-04-08 (×2): qty 2

## 2024-04-08 MED ORDER — OXYCODONE HCL 5 MG PO TABS
5.0000 mg | ORAL_TABLET | ORAL | Status: DC | PRN
  Administered 2024-04-08 – 2024-04-11 (×10): 5 mg via ORAL
  Filled 2024-04-08 (×10): qty 1

## 2024-04-08 MED ORDER — ENOXAPARIN SODIUM 30 MG/0.3ML IJ SOSY
30.0000 mg | PREFILLED_SYRINGE | INTRAMUSCULAR | Status: DC
  Administered 2024-04-08: 30 mg via SUBCUTANEOUS
  Filled 2024-04-08: qty 0.3

## 2024-04-08 MED ORDER — PIPERACILLIN-TAZOBACTAM 3.375 G IVPB 30 MIN
3.3750 g | Freq: Once | INTRAVENOUS | Status: AC
  Administered 2024-04-08: 3.375 g via INTRAVENOUS
  Filled 2024-04-08: qty 50

## 2024-04-08 MED ORDER — ALLOPURINOL 300 MG PO TABS
150.0000 mg | ORAL_TABLET | Freq: Every day | ORAL | Status: DC
  Administered 2024-04-08 – 2024-04-12 (×5): 150 mg via ORAL
  Filled 2024-04-08: qty 0.5
  Filled 2024-04-08: qty 2
  Filled 2024-04-08 (×2): qty 0.5
  Filled 2024-04-08 (×4): qty 2
  Filled 2024-04-08 (×2): qty 0.5

## 2024-04-08 MED ORDER — CLOPIDOGREL BISULFATE 75 MG PO TABS
75.0000 mg | ORAL_TABLET | Freq: Every day | ORAL | Status: DC
  Administered 2024-04-08 – 2024-04-12 (×5): 75 mg via ORAL
  Filled 2024-04-08 (×4): qty 1

## 2024-04-08 MED ORDER — LEVOTHYROXINE SODIUM 137 MCG PO TABS
137.0000 ug | ORAL_TABLET | Freq: Every day | ORAL | Status: DC
Start: 2024-04-09 — End: 2024-04-12
  Administered 2024-04-09 – 2024-04-12 (×4): 137 ug via ORAL
  Filled 2024-04-08 (×4): qty 1

## 2024-04-08 NOTE — Progress Notes (Signed)
 ED Pharmacy Antibiotic Sign Off An antibiotic consult was received from an ED provider for vancomycin   per pharmacy dosing for sepsis. A chart review was completed to assess appropriateness.   The following one time order(s) were placed:   vancomycin  1250 mg IV x 1  Further antibiotic and/or antibiotic pharmacy consults should be ordered by the admitting provider if indicated.   Thank you for allowing pharmacy to be a part of this patient's care.   Adriana JONETTA Bolster, [facility]  Clinical Pharmacist 04/08/24 7:42 AM

## 2024-04-08 NOTE — Assessment & Plan Note (Addendum)
 The patient will receive lasix  20 mg bid. Nitroglycerin  gtt also will help reduce pulmonary vascular congestion. Monitor patient's I's and O's carefully.

## 2024-04-08 NOTE — Assessment & Plan Note (Signed)
 Will check lipid panel and continue atorvastatin  40 mg as prior to presentation.

## 2024-04-08 NOTE — ED Notes (Signed)
 Advised nurse that patient has ready bed

## 2024-04-08 NOTE — Assessment & Plan Note (Signed)
 Most recent echocardiogram was performed on 07/29/2023. It demonstrated an EF of 55-50% with normal lv function and no regional wall motion abnormalities. There is mild left bentricular hypertrophy. RV systolic function is normal. The RV size is normal. Left atrial size was moderately dilated. The patient was placed on a nitroglycerin  drip initially. It has now been discontinued. Cardiology is consulted. Monitor on telemetry.

## 2024-04-08 NOTE — Assessment & Plan Note (Signed)
 The patient's blood pressure at home is controlled with coreg  3.125 bid, hydralazine  100 mg tid, and entresto  24/26 daily, and imdur  60 mg bid. As inpatient the patient initially will receive only the coreg , entresto , and imdur . Will wait to restart hydralazine  until it is clear that her blood pressure will tolerate this.

## 2024-04-08 NOTE — Assessment & Plan Note (Signed)
 Baseline creatinine is 1.5 - 1.72. The patient's creatinine on admission is close to baseline at 1.92. Monitor creatinine, electrolytes, and volume status.

## 2024-04-08 NOTE — ED Notes (Signed)
 Attempted to call report to ICU at this time. ICU speaking with MD regarding downgrading patient per staff.

## 2024-04-08 NOTE — ED Notes (Signed)
 CCMD called at this time for cardiac monitoring.

## 2024-04-08 NOTE — Assessment & Plan Note (Signed)
 The patient will receive lasix  20 mg bid. Nitroglycerin  gtt also will help reduce pulmonary vascular congestion. Monitor patient's I's and O's carefully.

## 2024-04-08 NOTE — Assessment & Plan Note (Signed)
 Noted. Troponin is increased from 80 initially to 130. EKG demonstrates LBBB ( present on prior EKG's) and a junctional rhythm. Morphine  is available for pain control.

## 2024-04-08 NOTE — H&P (Signed)
 History and Physical    Patient: Stephanie Charles FMW:[id number] DOB: 19-Aug-1930 DOA: 04/08/2024 DOS: the patient was seen and examined on 04/08/2024 PCP: Bertrum Charlie CROME, MD  Patient coming from: Home  Chief Complaint:  Chief Complaint  Patient presents with   Chest Pain   Shortness of Breath   Spasms   HPI: Stephanie Charles is a 89 y.o. female with medical history significant for LBBB, hypertension, CKD II, hyperlipidemia, hypothyroidism, anxiety, depression, CAD, Hx MI and Stent, CHF, Diverticulitis, GERD, TIA, HOH left ear (hearing aid).  The patient presented to the ED with complaints of shortness of breath and a pressure like feeling in her chest. She also states that it feels like a muscle spasm in her chest.   In the ED her BP was elevated at 177/70 HR was 110, RR 25. Temperature in ED was 99.5. The patient states that she had a fever at home.   She required 4L O2 in order to maintain saturations greater than 90%. CXR demonstrated pulmonary edema. Her creatinine was 1.92 which is slightly elevated over her baseline of 1.5 - 1.72. EKG demonstrated LBBB with a junctional rhythm at a rate of 114.   In the ED she was placed on an nitroglycerin  gtt, she was also given IV zosyn  and vancomycin .   There is some concern for sepsis. Procalcitonin is pending. UA is pending. Blood cultures x 2 are pending. Will continue IV antibiotics at this point as although the source of infection is not obvious, The patient does have an increased WBC and gives a history of fevers at home. Will screen for MRSA so that vancomycin  can be discontinued if it is negative.   Echocardiogram is pending and cardiology has been consulted.  I have confirmed with the patient that she is a DNR/DNI.  The patient will be admitted to a progressive bed.  Review of Systems: As mentioned in the history of present illness. All other systems reviewed and are negative. Past Medical History:  Diagnosis Date   Anemia    C.  difficile diarrhea    CHF (congestive heart failure) (HCC) 10/05/2021   Diverticulitis    GERD (gastroesophageal reflux disease)    Hyperlipidemia    Hypertension    Hypothyroidism    Myocardial infarction (HCC) 08/2017   TIA (transient ischemic attack)    1 approx 2015, 1 approx 2018   Vertigo    last episode several months ago   [name]    Past Surgical History:  Procedure Laterality Date   COLONOSCOPY WITH PROPOFOL  N/A 06/22/2018   Procedure: COLONOSCOPY WITH PROPOFOL ;  Surgeon: Therisa Bi, MD;  Location: [facility] ENDOSCOPY;  Service: Endoscopy;  Laterality: N/A;   CORONARY STENT INTERVENTION N/A 06/07/2019   Procedure: CORONARY STENT INTERVENTION;  Surgeon: Darron Deatrice LABOR, MD;  Location: [facility] INVASIVE CV LAB;  Service: Cardiovascular;  Laterality: N/A;   ESOPHAGOGASTRODUODENOSCOPY (EGD) WITH PROPOFOL  N/A 06/22/2018   Procedure: ESOPHAGOGASTRODUODENOSCOPY (EGD) WITH PROPOFOL ;  Surgeon: Therisa Bi, MD;  Location: [facility] ENDOSCOPY;  Service: Endoscopy;  Laterality: N/A;   KNEE ARTHROSCOPY Right    REPLACEMENT TOTAL KNEE BILATERAL     RIGHT/LEFT HEART CATH AND CORONARY ANGIOGRAPHY N/A 06/07/2019   Procedure: RIGHT/LEFT HEART CATH AND CORONARY ANGIOGRAPHY;  Surgeon: Perla Evalene PARAS, MD;  Location: [facility] INVASIVE CV LAB;  Service: Cardiovascular;  Laterality: N/A;   SHOULDER SURGERY Left    Social History:  reports that she has never smoked. She has never used smokeless  tobacco. She reports that she does not drink alcohol and does not use drugs.  Allergies  Allergen Reactions   Codeine Nausea And Vomiting and Nausea Only   Levofloxacin Other (See Comments)    Mouth sores   Lisinopril Other (See Comments) and Cough    Cough?   Other Itching   Povidone-Iodine Other (See Comments), Rash and Dermatitis    Severe blistering and itchiness. Severe redness   Simvastatin Other (See Comments)    Myalgias   Latex Itching    Family History  Problem Relation Age of  Onset   Heart disease Mother    Hypertension Mother    Stroke Mother    Heart disease Father    Breast cancer Sister    Alzheimer's disease Brother    Heart attack Brother    Stroke Brother    Heart disease Brother    Mental illness Brother        died suicide   Cancer Brother        prostate   Heart disease Brother        atrial fib   Alzheimer's disease Brother    Clotting disorder Daughter    Fibromyalgia Daughter     Prior to Admission medications   Medication Sig Start Date End Date Taking? Authorizing Provider  albuterol  (PROVENTIL ) (2.5 MG/3ML) 0.083% nebulizer solution USE 1 VIAL IN NEBULIZER EVERY 4 HOURS AS NEEDED 01/04/22   Bertrum Charlie CROME, MD  allopurinol  (ZYLOPRIM ) 300 MG tablet Take 150 mg by mouth daily. 10/25/23   [provider]  amitriptyline  (ELAVIL ) 10 MG tablet Take 1 tablet (10 mg total) by mouth at bedtime. 12/27/22   Gasper Nancyann BRAVO, MD  ASPIRIN  LOW DOSE 81 MG EC tablet NEW PRESCRIPTION REQUEST: TAKE ONE TABLET BY MOUTH EVERY DAY 08/03/21   Bertrum Charlie CROME, MD  atorvastatin  (LIPITOR) 40 MG tablet Take 1 tablet by mouth once daily 03/23/24   Gollan, Timothy J, MD  carvedilol  (COREG ) 3.125 MG tablet Take 1 tablet (3.125 mg total) by mouth 2 (two) times daily with a meal. 11/08/23   Perla Evalene PARAS, MD  clopidogrel  (PLAVIX ) 75 MG tablet Take 1 tablet by mouth once daily 04/05/24   Gollan, Timothy J, MD  cyclobenzaprine  (FLEXERIL ) 5 MG tablet Take 5 mg by mouth 3 (three) times daily as needed. 09/13/23   [provider]  Ferrous Sulfate  27 MG TABS Take 65 mg by mouth at bedtime.    [provider]  fluorometholone  (FML) 0.1 % ophthalmic suspension Place 1 drop into both eyes 2 (two) times daily. 01/17/24   [provider]  gabapentin  (NEURONTIN ) 100 MG capsule Take 1 capsule by mouth at bedtime. 11/10/23   [provider]  hydrALAZINE  (APRESOLINE ) 100 MG tablet Take 1 tablet (100 mg total) by mouth 3 (three) times daily.  TAKE 1 TABLET BY MOUTH THREE TIMES DAILY FOR HIGH BLOOD PRESSURE 01/13/24   Furth, Cadence H, PA-C  isosorbide  mononitrate (IMDUR ) 60 MG 24 hr tablet Take 1 tablet (60 mg total) by mouth 2 (two) times daily. 11/08/23   Gollan, Timothy J, MD  levothyroxine  (SYNTHROID ) 137 MCG tablet Take 1 tablet (137 mcg total) by mouth daily before breakfast. 04/12/22   Bertrum Charlie CROME, MD  Melatonin 3 MG TBDP Take 1 tablet by mouth at bedtime.    [provider]  mirtazapine  (REMERON ) 30 MG tablet TAKE 1 TABLET BY MOUTH AT BEDTIME 04/21/22   Bertrum Charlie CROME, MD  Multiple Vitamin (MULTIVITAMIN)  capsule Take 1 capsule by mouth every evening.    [provider]  nitroGLYCERIN  (NITROSTAT ) 0.4 MG SL tablet Place 1 tablet (0.4 mg total) under the tongue every 5 (five) minutes as needed for chest pain. 04/09/22   Gollan, Timothy J, MD  pantoprazole  (PROTONIX ) 40 MG tablet Take 1 tablet (40 mg total) by mouth daily. 11/08/23   Gollan, Timothy J, MD  potassium chloride  (KLOR-CON ) 10 MEQ tablet Take 1 tablet (10 mEq total) by mouth daily as needed (when you take torsemide ). 11/08/23   Gollan, Timothy J, MD  sacubitril -valsartan  (ENTRESTO ) 24-26 MG Take 1 tablet by mouth 2 (two) times daily. 12/09/23   Gollan, Timothy J, MD  sertraline  (ZOLOFT ) 25 MG tablet Take 1 tablet by mouth once daily 04/21/22   Gilbert, Richard L, MD  torsemide  (DEMADEX ) 20 MG tablet Take 20 mg by mouth as needed.    [provider]    Physical Exam: Vitals:   04/08/24 0930 04/08/24 1037 04/08/24 1155 04/08/24 1215  BP: (!) 145/64 (!) 142/54 (!) 92/37 (!) 148/61  Pulse: 91 85 93 83  Resp: (!) 21     Temp:  98.3 F (36.8 C) 98 F (36.7 C) 97.7 F (36.5 C)  TempSrc:  Oral    SpO2: 98% 96% 98% 95%   Exam:  Constitutional:  The patient is awake, alert, and oriented x 3. No acute distress. Eyes:  pupils and irises appear normal The conjunctiva of the left eye are erythematous and covered with dried mattered material.   ENMT:  grossly normal hearing  Lips appear normal external ears, nose appear normal Oropharynx: mucosa, tongue,posterior pharynx appear normal Neck:  neck appears normal, no masses, normal ROM, supple no thyromegaly Respiratory:  No increased work of breathing. No wheezes, rales, or rhonchi No tactile fremitus Cardiovascular:  Regular rate and rhythm No murmurs, ectopy, or gallups. No lateral PMI. No thrills. Abdomen:  Abdomen is soft, non-tender, non-distended No hernias, masses, or organomegaly Normoactive bowel sounds.  Musculoskeletal:  No cyanosis, clubbing, or edema Skin:  No rashes, lesions, ulcers palpation of skin: no induration or nodules Neurologic:  CN 2-12 intact Sensation all 4 extremities intact Psychiatric:  Mental status Mood, affect appropriate Orientation to person, place, time  judgment and insight appear intact  Data Reviewed: CXR CBC CMP BNP Troponin EKG Lactic acid.   Assessment and Plan: HFrEF (heart failure with reduced ejection fraction) (HCC) Most recent echocardiogram was performed on 07/29/2023. It demonstrated an EF of 55-50% with normal lv function and no regional wall motion abnormalities. There is mild left bentricular hypertrophy. RV systolic function is normal. The RV size is normal. Left atrial size was moderately dilated. The patient was placed on a nitroglycerin  drip initially. It has now been discontinued. Cardiology is consulted. Monitor on telemetry.  Chronic kidney disease, stage 4 (severe) (HCC) Baseline creatinine is 1.5 - 1.72. The patient's creatinine on admission is close to baseline at 1.92. Monitor creatinine, electrolytes, and volume status.  Essential hypertension The patient's blood pressure at home is controlled with coreg  3.125 bid, hydralazine  100 mg tid, and entresto  24/26 daily, and imdur  60 mg bid. As inpatient the patient initially will receive only the coreg , entresto , and imdur . Will wait to restart  hydralazine  until it is clear that her blood pressure will tolerate this.  Adult hypothyroidism The patient is on levothyroxine  137 mcg daily.  Acute hypoxic respiratory failure (HCC) The patient does not ordinarily require O2. In the ED she is requiring 4L  O2 by nasal cannula to maintain saturations in the 90's.  Atypical chest pain Noted. Troponin is increased from 80 initially to 130. EKG demonstrates LBBB ( present on prior EKG's) and a junctional rhythm. Morphine  is available for pain control.   HLD (hyperlipidemia) Will check lipid panel and continue atorvastatin  40 mg as prior to presentation.  Pulmonary edema cardiac cause [facility]) The patient will receive lasix  20 mg bid. Nitroglycerin  gtt also will help reduce pulmonary vascular congestion. Monitor patient's I's and O's carefully.  Sepsis [facility]) The patient presented with vitals and findings that are consistent with sepsis, although there is no obvious infectious source. She presented with a RR 25, HR 110, her SBP dropped from 177/70 to 129/57. The patient states that she had fevers at home. She has had only a low grade temp in the ED. WBC is elevated at 11.9. She is receiving zosyn  and vancomycin . Blood cultures x 2 and UA is pending.      Advance Care Planning:   Code Status: Limited: Do not attempt resuscitation (DNR) -DNR-LIMITED -Do Not Intubate/DNI    Consults: Cardiology  Family Communication: None available  Severity of Illness: The appropriate patient status for this patient is INPATIENT. Inpatient status is judged to be reasonable and necessary in order to provide the required intensity of service to ensure the patient's safety. The patient's presenting symptoms, physical exam findings, and initial radiographic and laboratory data in the context of their chronic comorbidities is felt to place them at high risk for further clinical deterioration. Furthermore, it is not anticipated that the patient will be medically stable  for discharge from the hospital within 2 midnights of admission.   * I certify that at the point of admission it is my clinical judgment that the patient will require inpatient hospital care spanning beyond 2 midnights from the point of admission due to high intensity of service, high risk for further deterioration and high frequency of surveillance required.*  Author: Gilmar Bua, DO 04/08/2024 2:21 PM  For on call review [email or web address].

## 2024-04-08 NOTE — Assessment & Plan Note (Signed)
 The patient is on levothyroxine  137 mcg daily.

## 2024-04-08 NOTE — ED Triage Notes (Signed)
 Pt to ED via [facility] c/o SOB, muscle spasms, and chest pain. Respirations in 40s en route. 02 sats 85-90 RA. Pt placed on 4 L 02 [place]. BS: 125 HX: CHF Stemi EKG bundle branch en route HTN 200/90. GCS 15

## 2024-04-08 NOTE — Assessment & Plan Note (Signed)
 The patient presented with vitals and findings that are consistent with sepsis, although there is no obvious infectious source. She presented with a RR 25, HR 110, her SBP dropped from 177/70 to 129/57. The patient states that she had fevers at home. She has had only a low grade temp in the ED. WBC is elevated at 11.9. She is receiving zosyn  and vancomycin . Blood cultures x 2 and UA is pending.

## 2024-04-08 NOTE — ED Notes (Signed)
 Critical Result: Troponin 103  Mian, MD made aware

## 2024-04-08 NOTE — Plan of Care (Signed)

## 2024-04-08 NOTE — Assessment & Plan Note (Signed)
 The patient does not ordinarily require O2. In the ED she is requiring 4L O2 by nasal cannula to maintain saturations in the 90's.

## 2024-04-08 NOTE — ED Notes (Signed)
RT at bedside placing pt on BIPAP

## 2024-04-08 NOTE — ED Provider Notes (Signed)
 [facility] Provider Note    Event Date/Time   First MD Initiated Contact with Patient 04/08/24 [phone]     (approximate)   History   Chest Pain, Shortness of Breath, and Spasms  Pt to ED via [facility] c/o SOB, muscle spasms, and chest pain. Respirations in 40s en route. 02 sats 85-90 RA. Pt placed on 4 L 02 [place]. BS: 125 HX: CHF Stemi EKG bundle branch en route HTN 200/90. GCS 15   HPI Stephanie Charles is a 89 y.o. female PMH CHF, hypertension, hyperlipidemia, prior TIA presents for evaluation of chest discomfort, shortness of breath, muscle spasms - Patient states around 2 AM she started having shortness of breath and sensation of chest heaviness.  Also notes she has been having worsening of some chronic muscle spasms under her left rib cage. - Denies recent cough  Per EMS, was satting 85-90% on room air, improved on 4 L nasal cannula.  Systolic blood pressure of about 200.  Received 324 mg aspirin .  EKG with left bundle branch block.  Per chart review, last echo 07/2023, normal EF.     Physical Exam     Most recent vital signs: Vitals:   04/08/24 0715 04/08/24 0745  BP: (!) 134/59 (!) 136/58  Pulse: 95 90  Resp: (!) 21 20  Temp:    SpO2: 98% 98%    Patient feels warm  -- oral temperature 100.3 on my evaluation.  General: Awake, ill-appearing  CV:  Good peripheral perfusion. RRR, RP 2+ Resp:  +tachypnic, 5 word sentences.  Diminished breath sounds bilateral lower lung fields Abd:  No distention. Nontender to deep palpation throughout Other:  No significant lower extremity edema  Bedside ultrasound with significant B-lines in bilateral lung fields.  Variable IVC.   ED Results / Procedures / Treatments   Labs (all labs ordered are listed, but only abnormal results are displayed) Labs Reviewed  COMPREHENSIVE METABOLIC PANEL WITH GFR - Abnormal; Notable for the following components:      Result Value   Glucose, Bld 137 (*)    BUN 53 (*)     Creatinine, Ser 1.92 (*)    GFR, Estimated 24 (*)    All other components within normal limits  CBC WITH DIFFERENTIAL/PLATELET - Abnormal; Notable for the following components:   WBC 11.9 (*)    RBC 3.80 (*)    Platelets 143 (*)    Neutro Abs 11.0 (*)    Lymphs Abs 0.5 (*)    All other components within normal limits  LIPASE, BLOOD - Abnormal; Notable for the following components:   Lipase 137 (*)    All other components within normal limits  BRAIN NATRIURETIC PEPTIDE - Abnormal; Notable for the following components:   B Natriuretic Peptide 359.3 (*)    All other components within normal limits  TROPONIN I (HIGH SENSITIVITY) - Abnormal; Notable for the following components:   Troponin I (High Sensitivity) 80 (*)    All other components within normal limits  RESP PANEL BY RT-PCR (RSV, FLU A&B, COVID)  RVPGX2  CULTURE, BLOOD (ROUTINE X 2)  CULTURE, BLOOD (ROUTINE X 2)  LACTIC ACID, PLASMA  PROTIME-INR  URINALYSIS, W/ REFLEX TO CULTURE (INFECTION SUSPECTED)  LACTIC ACID, PLASMA  TROPONIN I (HIGH SENSITIVITY)     EKG  Ecg = sinus tachycardia, left bundle branch block present (not new on comparison with prior) --not clearly meeting Sgarbossa criteria.   RADIOLOGY Radiology interpreted by myself and radiology report  reviewed.  Notable for pulmonary edema.    PROCEDURES:  Critical Care performed: Yes, see critical care procedure note(s)  .Critical Care  Performed by: Clarine Ozell LABOR, MD Authorized by: Clarine Ozell LABOR, MD   Critical care provider statement:    Critical care time (minutes):  30   Critical care time was exclusive of:  Separately billable procedures and treating other patients   Critical care was necessary to treat or prevent imminent or life-threatening deterioration of the following conditions:  Respiratory failure   Critical care was time spent personally by me on the following activities:  Development of treatment plan with patient or surrogate,  discussions with consultants, evaluation of patient's response to treatment, examination of patient, ordering and review of laboratory studies, ordering and review of radiographic studies, ordering and performing treatments and interventions, pulse oximetry, re-evaluation of patient's condition and review of old charts   I assumed direction of critical care for this patient from another provider in my specialty: no     Care discussed with: admitting provider      MEDICATIONS ORDERED IN ED: Medications  lidocaine  (LIDODERM ) 5 % 1 patch (1 patch Transdermal Patch Applied 04/08/24 0553)  nitroGLYCERIN  50 mg in dextrose  5 % 250 mL (0.2 mg/mL) infusion (5 mcg/min Intravenous Rate/Dose Change 04/08/24 0726)  piperacillin -tazobactam (ZOSYN ) IVPB 3.375 g (has no administration in time range)  vancomycin  (VANCOREADY) IVPB 1250 mg/250 mL (has no administration in time range)  acetaminophen  (TYLENOL ) tablet 1,000 mg (1,000 mg Oral Given 04/08/24 0553)  furosemide  (LASIX ) injection 20 mg (20 mg Intravenous Given 04/08/24 0619)     IMPRESSION / MDM / ASSESSMENT AND PLAN / ED COURSE  I reviewed the triage vital signs and the nursing notes.                              DDX/MDM/AP: Differential diagnosis includes, but is not limited to, high concern for hypertensive emergency/flash pulmonary edema, consider ACS, CHF exacerbation, pneumonia, viral syndrome including COVID-19 or influenza.  Less likely pulmonary embolism but will reevaluate if not improving with treatment.  Plan: - Supplemental oxygen  - Chest x-ray - Labs - EKG - Nitroglycerin  drip - BiPAP - Anticipate admission  Patient's presentation is most consistent with acute presentation with potential threat to life or bodily function.  The patient is on the cardiac monitor to evaluate for evidence of arrhythmia and/or significant heart rate changes.  ED course below.  Patient responded well to nitroglycerin  drip and BiPAP, breathing much more  comfortably.  No clear source of fever at this time, does have leukocytosis with left shift as well as tachycardia--deferred IV fluid bolus in the setting of pulmonary edema, did treat with broad-spectrum antibiotics.  Admitted to hospitalist service.  Clinical Course as of 04/08/24 0753  [place] Apr 08, 2024  0610 CXR: IMPRESSION: Evidence of Acute Pulmonary Edema, trace pleural fluid now, superimposed on chronic cardiomegaly and calcified aortic atherosclerosis.   [MM]  [id number] CBC with leukocytosis with left shift, no anemia [MM]  0619 BNP elevated similar to baseline [MM]  0720 Viral swab neg [MM]  0728 BMP with mild AKI on CKD [MM]    Clinical Course User Index [MM] Clarine Ozell LABOR, MD     FINAL CLINICAL IMPRESSION(S) / ED DIAGNOSES   Final diagnoses:  Hypertensive emergency  Flash pulmonary edema (HCC)  Sepsis, due to unspecified organism, unspecified whether acute organ dysfunction present (HCC)  Rx / DC Orders   ED Discharge Orders     None        Note:  This document was prepared using Dragon voice recognition software and may include unintentional dictation errors.   Clarine Ozell LABOR, MD 04/08/24 [phone]

## 2024-04-08 NOTE — Consult Note (Signed)
 Cardiology Consultation   Patient ID: Stephanie Charles MRN: [id number]; DOB: 12-Sep-1930  Admit date: 04/08/2024 Date of Consult: 04/08/2024  PCP:  Stephanie Charlie CROME, MD   [facility] Providers Cardiologist:  Stephanie Lunger, MD        Patient Profile: Stephanie Charles is a 89 y.o. 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 difficile, CKD stage III, anemia, gout, anxiety/depression, who is being seen 04/08/2024 for the evaluation of elevated high-sensitivity troponins, hypertensive emergency, and shortness of breath at the request of Stephanie Charles.  History of Present Illness:   Stephanie Charles had previously followed with Stephanie Charles and transition to [facility] in 2020.  She was admitted to [facility] in 2020 with an NSTEMI and hypertensive urgency.  Echocardiogram revealed an LVEF 35 to 40%.  Frequent PVCs noted.  Right and left heart catheterization showed mid LAD 95% stenosis status post successful PCI/DES, D2 30% stenosis.  Right heart catheterization showed mean RA pressure of 8, RV pressure 32/7 with a mean of 9, PA pressure 31/16 with a mean of 21, PCWP 10, CO/CI 3.57/2.02.  Echo 09/2019 showed an LVEF of 40-45%, moderate LVH, G1 DD echo in 2022 showed an LVEF of 50-55%.  Repeat echo 10/2021 showed EF 40%.,  Moderate LVH, normal RV SF, frequent PVCs.  Outpatient cardiac monitor showed normal sinus rhythm with an average heart rate of 70 bpm, left bundle branch block, 1 run of nonsustained V. tach and 3.4% PVC burden.  She was admitted 07/2023 with chest pain that woke her up from sleep with elevated blood pressures.  She was treated with IV heparin  x 48 hours.  Echocardiogram showed an LVEF of 55 to 60%.  She was restarted on  Entresto .  She was felt to be a poor cath candidate.  VQ scan was completed which revealed no PE.  She was seen in clinic February 2025 where amlodipine  was stopped for lower leg edema.  She was evaluated in the emergency department 01/06/24 for hypertension and nausea.  All blood pressure was 213/102.  Blood pressure in the ER was 172/80.  High-sensitivity troponin of 6967.  Labs are stable.  Chest x-ray and CT of the head were reassuring.  She was discharged home.  She was seen in clinic 01/12/24 where she was feeling okay.  She stated when her blood pressure run high she would had headaches and nausea.  She was continued on her current medication regimen with increasing her hydralazine  to 100 mg twice daily.  She was unable to have beta-blocker therapy due to baseline bradycardia and first-degree AV block.  Discussed noninvasive testing due to the minimally elevated troponin in the setting of severe hypertension during recent hospitalization and she declined until her following appointment the next month with Stephanie Charles.  She was seen by Stephanie Charles 02/17/2024.  At that time she is continued to have shortness of breath on exertion.  She denied any chest pain concerning for angina.  No medication changes were made.  Given age and debility felt she was not a candidate for further invasive ischemic studies such as a cardiac catheterization.  Medical management was continued.   She presented to the [facility] emergency department via EMS with complaints of shortness of breath, muscle spasms, and chest pain.  She was noted to be tachypneic with respirations in the 40s and oxygen  saturations of 85-90% on room air.  She was placed on 4 L of O2 nasal cannula.  Blood sugar was 125.  Bundle branch was noted on EKG done by EMS.  Blood pressure was noted to be 200/90.  On arrival to the emergency department she states around 2 AM she started having shortness of breath and sensation of chest heaviness.  She notes she is also  been having some chronic muscle spasms under her left rib cage or at the left side.  She states that she has been taking muscle relaxers for approximately 2 weeks.  She also noted a prior fall in the shower.  When asked about her chest discomfort she states that it feels as though it is a muscle spasm or potentially a rib out of place as the pain is under her rib cage on the lower left side of her abdomen.  Initial vital signs reveal blood pressure 134/59, pulse of 95, respirations of 21, oxygen  saturations of 98%.  Previous revealed glucose 137, BUN 53, serum creatinine 1.12, GFR 24, WBCs of 11.5, platelets 123, lipase 137, BNP 359.3, high-sensitivity troponin 88 and 43, respiratory viral PCR negative for COVID/flu/RSV.  Chest x-ray revealed evidence of acute pulmonary edema with trace pleural fluid now superimposed on chronic cardiomegaly and calcified aortic atherosclerosis.  She was treated with a Lidoderm  patch, nitroglycerin  drip for elevated blood pressure, given Zosyn  3.375 g IVPB, vancomycin  1250 mg IVPB, furosemide  20 mg IVP, and 1000 g of Tylenol .  She responded well with the nitroglycerin  drip and originally was placed on BiPAP and was breathing more comfortably.  She was unable to tolerate the BiPAP mask due to her previous fall and discomfort to the top of her head sutures placed back on 4 L.  Since that time her breathing and discomfort have improved.  Cardiology was consulted to assist with ongoing management of chest discomfort, concern for hypertensive emergency, and acute respiratory failure.   Past Medical History:  Diagnosis Date   Anemia    C. difficile diarrhea    CHF (congestive heart failure) (HCC) 10/05/2021   Diverticulitis    GERD (gastroesophageal reflux disease)    Hyperlipidemia    Hypertension    Hypothyroidism    Myocardial infarction (HCC) 08/2017   TIA (transient ischemic attack)    1 approx 2015, 1 approx 2018   Vertigo    last episode several months ago    [name]     Past Surgical History:  Procedure Laterality Date   COLONOSCOPY WITH PROPOFOL  N/A 06/22/2018   Procedure: COLONOSCOPY WITH PROPOFOL ;  Surgeon: Therisa Bi, MD;  Location: [facility] ENDOSCOPY;  Service: Endoscopy;  Laterality: N/A;   CORONARY STENT INTERVENTION N/A 06/07/2019   Procedure: CORONARY STENT INTERVENTION;  Surgeon: Darron Deatrice LABOR, MD;  Location: [facility] INVASIVE CV LAB;  Service: Cardiovascular;  Laterality: N/A;   ESOPHAGOGASTRODUODENOSCOPY (EGD) WITH PROPOFOL  N/A 06/22/2018   Procedure: ESOPHAGOGASTRODUODENOSCOPY (EGD) WITH PROPOFOL ;  Surgeon: Therisa Bi, MD;  Location: [facility] ENDOSCOPY;  Service: Endoscopy;  Laterality: N/A;   KNEE ARTHROSCOPY Right    REPLACEMENT TOTAL KNEE BILATERAL     RIGHT/LEFT HEART CATH AND CORONARY ANGIOGRAPHY N/A 06/07/2019   Procedure: RIGHT/LEFT HEART CATH AND CORONARY ANGIOGRAPHY;  Surgeon: Perla Stephanie PARAS, MD;  Location: [facility] INVASIVE CV LAB;  Service: Cardiovascular;  Laterality: N/A;   SHOULDER SURGERY Left      Home Medications:  Prior to Admission medications   Medication Sig Start Date End Date Taking? Authorizing Provider  albuterol  (PROVENTIL ) (2.5 MG/3ML) 0.083% nebulizer solution USE 1 VIAL IN NEBULIZER EVERY 4 HOURS AS NEEDED 01/04/22   Stephanie Charlie CROME, MD  allopurinol  (ZYLOPRIM ) 300 MG tablet Take 150 mg by mouth daily. 10/25/23   [provider]  amitriptyline  (ELAVIL ) 10 MG tablet Take 1 tablet (10 mg total) by mouth at bedtime. 12/27/22   Gasper Nancyann BRAVO, MD  ASPIRIN  LOW DOSE 81 MG EC tablet NEW PRESCRIPTION REQUEST: TAKE ONE TABLET BY MOUTH EVERY DAY 08/03/21   Stephanie Charlie CROME, MD  atorvastatin  (LIPITOR) 40 MG tablet Take 1 tablet by mouth once daily 03/23/24   Charles, Timothy J, MD  carvedilol  (COREG ) 3.125 MG tablet Take 1 tablet (3.125 mg total) by mouth 2 (two) times daily with a meal. 11/08/23   Charles, Stephanie PARAS, MD  clopidogrel  (PLAVIX ) 75 MG tablet Take 1 tablet by mouth once daily 04/05/24    Charles, Timothy J, MD  cyclobenzaprine  (FLEXERIL ) 5 MG tablet Take 5 mg by mouth 3 (three) times daily as needed. 09/13/23   [provider]  Ferrous Sulfate  27 MG TABS Take 65 mg by mouth at bedtime.    [provider]  fluorometholone  (FML) 0.1 % ophthalmic suspension Place 1 drop into both eyes 2 (two) times daily. 01/17/24   [provider]  gabapentin  (NEURONTIN ) 100 MG capsule Take 1 capsule by mouth at bedtime. 11/10/23   [provider]  hydrALAZINE  (APRESOLINE ) 100 MG tablet Take 1 tablet (100 mg total) by mouth 3 (three) times daily. TAKE 1 TABLET BY MOUTH THREE TIMES DAILY FOR HIGH BLOOD PRESSURE 01/13/24   Furth, Cadence H, PA-C  isosorbide  mononitrate (IMDUR ) 60 MG 24 hr tablet Take 1 tablet (60 mg total) by mouth 2 (two) times daily. 11/08/23   Charles, Timothy J, MD  levothyroxine  (SYNTHROID ) 137 MCG tablet Take 1 tablet (137 mcg total) by mouth daily before breakfast. 04/12/22   Stephanie Charlie CROME, MD  Melatonin 3 MG TBDP Take 1 tablet by mouth at bedtime.    [provider]  mirtazapine  (REMERON ) 30 MG tablet TAKE 1 TABLET BY MOUTH AT BEDTIME 04/21/22   Stephanie Charlie CROME, MD  Multiple Vitamin (MULTIVITAMIN) capsule Take 1 capsule by mouth every evening.    [provider]  nitroGLYCERIN  (NITROSTAT ) 0.4 MG SL tablet Place 1 tablet (0.4 mg total) under the tongue every 5 (five) minutes as needed for chest pain. 04/09/22   Charles, Timothy J, MD  pantoprazole  (PROTONIX ) 40 MG tablet Take 1 tablet (40 mg total) by mouth daily. 11/08/23   Charles, Timothy J, MD  potassium chloride  (KLOR-CON ) 10 MEQ tablet Take 1 tablet (10 mEq total) by mouth daily as needed (when you take torsemide ). 11/08/23   Charles, Timothy J, MD  sacubitril -valsartan  (ENTRESTO ) 24-26 MG Take 1 tablet by mouth 2 (two) times daily. 12/09/23   Charles, Timothy J, MD  sertraline  (ZOLOFT ) 25 MG tablet Take 1 tablet by mouth once daily 04/21/22   Gilbert, Richard L, MD  torsemide  (DEMADEX )  20 MG tablet Take 20 mg by mouth as needed.    [provider]    Scheduled Meds:  allopurinol   150 mg Oral Daily   amitriptyline   10 mg Oral QHS   aspirin  EC  81 mg Oral Daily   atorvastatin   40 mg Oral Daily   carvedilol   3.125 mg Oral BID WC   clopidogrel   75 mg  Oral Daily   enoxaparin  (LOVENOX ) injection  30 mg Subcutaneous Q24H   Ferrous Sulfate   65 mg Oral QHS   fluorometholone   1 drop Both Eyes BID   furosemide   20 mg Intravenous Q12H   gabapentin   100 mg Oral QHS   [START ON 04/09/2024] levothyroxine   137 mcg Oral QAC breakfast   lidocaine   1 patch Transdermal Q24H   Melatonin  1 tablet Oral QHS   mirtazapine   30 mg Oral QHS   multivitamin  1 capsule Oral QPM   pantoprazole   40 mg Oral Daily   sacubitril -valsartan   1 tablet Oral BID   sertraline   25 mg Oral Daily   Continuous Infusions:  nitroGLYCERIN  Stopped (04/08/24 0753)   PRN Meds: acetaminophen  **OR** acetaminophen , albuterol , cyclobenzaprine , polyethylene glycol, potassium chloride   Allergies:    Allergies  Allergen Reactions   Codeine Nausea And Vomiting and Nausea Only   Levofloxacin Other (See Comments)    Mouth sores   Lisinopril Other (See Comments) and Cough    Cough?   Other Itching   Povidone-Iodine Other (See Comments), Rash and Dermatitis    Severe blistering and itchiness. Severe redness   Simvastatin Other (See Comments)    Myalgias   Latex Itching    Social History:   Social History   Socioeconomic History   Marital status: Divorced    Spouse name: na   Number of children: 4   Years of education: 14   Highest education level: Associate degree: occupational, Scientist, product/process development, or vocational program  Occupational History   Occupation: retired  Tobacco Use   Smoking status: Never   Smokeless tobacco: Never  Vaping Use   Vaping status: Never Used  Substance and Sexual Activity   Alcohol use: No    Alcohol/week: 0.0 standard drinks of alcohol   Drug use: No   Sexual activity:  Never  Other Topics Concern   Not on file  Social History Narrative   Not on file   Social Drivers of Health   Financial Resource Strain: Low Risk  (02/16/2022)   [facility]   Overall Financial Resource Strain (CARDIA)    Difficulty of Paying Living Expenses: Not hard at all  Food Insecurity: No Food Insecurity (07/28/2023)   Hunger Vital Sign    Worried About Running Out of Food in the Last Year: Never true    Ran Out of Food in the Last Year: Never true  Transportation Needs: No Transportation Needs (07/28/2023)   PRAPARE - Administrator, Civil Service (Medical): No    Lack of Transportation (Non-Medical): No  Physical Activity: Inactive (08/18/2020)   Exercise Vital Sign    Days of Exercise per Week: 0 days    Minutes of Exercise per Session: 0 min  Stress: No Stress Concern Present (08/18/2020)   [employer] of Occupational Health - Occupational Stress Questionnaire    Feeling of Stress : Not at all  Social Connections: Socially Isolated (08/18/2020)   Social Connection and Isolation Panel    Frequency of Communication with Friends and Family: More than three times a week    Frequency of Social Gatherings with Friends and Family: More than three times a week    Attends Religious Services: Never    Database administrator or Organizations: No    Attends Banker Meetings: Never    Marital Status: Divorced  Catering manager Violence: Not At Risk (07/28/2023)   Humiliation, Afraid, Rape, and Kick questionnaire  Fear of Current or Ex-Partner: No    Emotionally Abused: No    Physically Abused: No    Sexually Abused: No    Family History:   Family History  Problem Relation Age of Onset   Heart disease Mother    Hypertension Mother    Stroke Mother    Heart disease Father    Breast cancer Sister    Alzheimer's disease Brother    Heart attack Brother    Stroke Brother    Heart disease Brother    Mental illness Brother         died suicide   Cancer Brother        prostate   Heart disease Brother        atrial fib   Alzheimer's disease Brother    Clotting disorder Daughter    Fibromyalgia Daughter      ROS:  Please see the history of present illness.  Review of Systems  Constitutional:  Positive for malaise/fatigue.  Respiratory:  Positive for shortness of breath.   Cardiovascular:  Positive for chest pain.  Neurological:  Positive for weakness.    All other ROS reviewed and negative.     Physical Exam/Data: Vitals:   04/08/24 0843 04/08/24 0900 04/08/24 0930 04/08/24 1037  BP:  (!) 132/58 (!) 145/64 (!) 142/54  Pulse: 90 88 91 85  Resp: 18 18 (!) 21   Temp:    98.3 F (36.8 C)  TempSrc:    Oral  SpO2: 98% 98% 98% 96%   No intake or output data in the 24 hours ending 04/08/24 1116    02/17/2024   11:50 AM 01/12/2024    2:48 PM 11/08/2023   10:52 AM  Last 3 Weights  Weight (lbs) 145 lb 150 lb 6.4 oz 147 lb 8 oz  Weight (kg) 65.772 kg 68.221 kg 66.906 kg     There is no height or weight on file to calculate BMI.  General: Frail chronically ill-appearing HEENT: normal Neck: Unable to determine JVD due to body positioning  Vascular: No carotid bruits; Distal pulses 2+ bilaterally Cardiac:  normal S1, S2; RRR; no murmur  Lungs: Clear uppers with diminished bases to auscultation bilaterally, no wheezing, rhonchi or rales, respirations are unlabored at rest on 4 L of O2 via nasal cannula Abd: soft, nontender, no hepatomegaly  Ext: no edema Musculoskeletal:  No deformities, BUE and BLE strength normal and equal Skin: warm and dry  Neuro:  CNs 2-12 intact, no focal abnormalities noted Psych:  Normal affect   EKG:  The EKG was personally reviewed and demonstrates: Sinus rhythm, left bundle branch block, unifocal PVCs Telemetry:  Telemetry was personally reviewed and demonstrates: Sinus rhythm with left bundle branch block, unifocal PVCs with a rate of 90  Relevant CV Studies: Echo  07/2023 1. Left ventricular ejection fraction, by estimation, is 55 to 60%. The  left ventricle has normal function. The left ventricle has no regional  wall motion abnormalities. There is mild left ventricular hypertrophy.  Left ventricular diastolic parameters  are indeterminate.   2. Right ventricular systolic function is normal. The right ventricular  size is normal.   3. Left atrial size was moderately dilated.   4. The mitral valve is normal in structure. Mild mitral valve  regurgitation.   5. The aortic valve was not well visualized. Aortic valve regurgitation  is trivial.    Carotid US  12/2022  Summary:  Right Carotid: Velocities in the right ICA are consistent with  a 1-39%  stenosis.                Non-hemodynamically significant plaque <50% noted in the  CCA. The                 ECA appears >50% stenosed. Tortuous CCA.   Left Carotid: Velocities in the left ICA are consistent with a 1-39%  stenosis.               Non-hemodynamically significant plaque <50% noted in the  CCA. The                ECA appears <50% stenosed.    R/L heart cath 2020 Prox LAD lesion is 90% stenosed. Prox Cx to Mid Cx lesion is 40% stenosed. Prox RCA lesion is 30% stenosed.  Laboratory Data: High Sensitivity Troponin:   Recent Labs  Lab 04/08/24 0536 04/08/24 0730  TROPONINIHS 80* 103*     Chemistry Recent Labs  Lab 04/08/24 0536  NA 137  K 3.5  CL 102  CO2 23  GLUCOSE 137*  BUN 53*  CREATININE 1.92*  CALCIUM  9.3  GFRNONAA 24*  ANIONGAP 12    Recent Labs  Lab 04/08/24 0536  PROT 7.3  ALBUMIN  4.2  AST 33  ALT 26  ALKPHOS 104  BILITOT 0.6   Lipids No results for input(s): CHOL, TRIG, HDL, LABVLDL, LDLCALC, CHOLHDL in the last 168 hours.  Hematology Recent Labs  Lab 04/08/24 0536  WBC 11.9*  RBC 3.80*  HGB 12.0  HCT 36.2  MCV 95.3  MCH 31.6  MCHC 33.1  RDW 13.4  PLT 143*   Thyroid  No results for input(s): TSH, FREET4 in the last 168 hours.   BNP Recent Labs  Lab 04/08/24 0536  BNP 359.3*    DDimer No results for input(s): DDIMER in the last 168 hours.  Radiology/Studies:  DG Chest Port 1 View Result Date: 04/08/2024 CLINICAL DATA:  89 year old female with possible sepsis. EXAM: PORTABLE CHEST 1 VIEW COMPARISON:  Chest radiographs 01/06/2024 and earlier. FINDINGS: Portable AP upright view at 0551 hours. Improved lung volumes. Stable cardiomegaly and mediastinal contours. Calcified aortic atherosclerosis. Visualized tracheal air column is within normal limits. Chronic but increased bilateral pulmonary interstitial opacity, vascularity appears indistinct. And trace new fluid in the right minor fissure. No pneumothorax. No consolidation or layering pleural effusion identified. Paucity of bowel gas.  Stable visualized osseous structures. IMPRESSION: Evidence of Acute Pulmonary Edema, trace pleural fluid now, superimposed on chronic cardiomegaly and calcified aortic atherosclerosis. Electronically Signed   By: VEAR Hurst M.D.   On: 04/08/2024 05:59     Assessment and Plan: Hypertensive urgency -Presentation with EMS revealed a blood pressure of 200/90 -Blood pressure improved in the emergency department 134/59 -Continue on current medication regimen -Recommend PTA hydralazine  100 mg 3 times daily be restarted - Vital signs per unit protocol  Flash pulmonary edema -Presented tachypneic and hypoxic -Was placed nitroglycerin  infusion, given furosemide , placed on BiPAP -Symptoms had improved, BiPAP removed, nitroglycerin  infusion weaned off -Chest x-ray consistent with evidence of acute pulmonary edema, trace pleural fluid, superimposed on chronic cardiomegaly with calcified aortic atherosclerosis -Continued on IV furosemide  twice daily  Elevated high-sensitivity troponin/coronary artery disease -Presented with left lower rib pain, shortness of breath, elevated blood pressure -High-sensitivity troponins trended 80 and 103, continue  to trend to decline -Previously had discussed further testing, patient declined -She is not considered to be a candidate given age and debility for invasive ischemic studies such as  cardiac catheterization -If continued elevation in high-sensitivity troponin could consider heparin  infusion x 48 hours -Discomfort is atypical and she is complaining of rib and abdominal pain -Continued on aspirin  81 mg daily, atorvastatin  40 mg daily, clopidogrel  75 mg daily - Recommend restarting Imdur  and hydralazine  -EKG as needed for pain or changes  Chronic HFimpEF/ischemic cardiomyopathy -EF 35 to 40% in 2020 -EF 55 to 60% in 2024 -NYHA class II-III symptoms -Appears fairly euvolemic on exam -BNP 359.3 -Updated echocardiogram ordered and pending with further recommendations to follow -Continued on carvedilol  3.125 mg twice daily, continued on furosemide  20 mg IV twice daily continued on Entresto  24/26 mg twice daily, consider SGLT2 inhibitor, not a candidate for MRA due to CKD -Recommend restarting isosorbide  and hydralazine  -Daily weights and I's and O's  Carotid artery stenosis -Carotid duplex in 2024 showed 1-39% bilateral carotid stenosis -Continue aspirin  and statin therapy  CKD stage III-IV - Baseline serum creatinine 1.6/1.8 - Serum creatinine 1.92 - Monitor urine output - Monitor/trend/replete electrolytes as needed - Daily BMP - Avoid nephrotoxic agents were able  7.   Left bundle branch block - Chronic finding on EKGs - Continue to monitor with surveillance studies  8.    Mixed hyperlipidemia - LDL 51, remains at goal of less than 70 - Continue atorvastatin  40   Risk Assessment/Risk Scores:    TIMI Risk Score for Unstable Angina or Non-ST Elevation MI:   The patient's TIMI risk score is 5, which indicates a 26% risk of all cause mortality, new or recurrent myocardial infarction or need for urgent revascularization in the next 14 days.  [place]  Heart Association (NYHA)  Functional Class NYHA Class II       For questions or updates, please contact [facility] Please consult [email or web address] for contact info under    Signed, Pawan Knechtel, NP  04/08/2024 11:16 AM

## 2024-04-09 ENCOUNTER — Inpatient Hospital Stay
Admit: 2024-04-09 | Discharge: 2024-04-09 | Disposition: A | Discharge status: Home / Self Care | Attending: Internal Medicine | Admitting: Internal Medicine

## 2024-04-09 ENCOUNTER — Inpatient Hospital Stay

## 2024-04-09 DIAGNOSIS — J810 Acute pulmonary edema: Secondary | ICD-10-CM

## 2024-04-09 DIAGNOSIS — J9601 Acute respiratory failure with hypoxia: Secondary | ICD-10-CM | POA: Diagnosis not present

## 2024-04-09 DIAGNOSIS — I5189 Other ill-defined heart diseases: Secondary | ICD-10-CM

## 2024-04-09 HISTORY — DX: Other ill-defined heart diseases: I51.89

## 2024-04-09 LAB — LIPID PANEL
Cholesterol: 73 mg/dL (ref 0–200)
HDL: 26 mg/dL — ABNORMAL LOW (ref >40)
LDL Cholesterol: 31 mg/dL (ref 0–99)
Total CHOL/HDL Ratio: 2.8 ratio
Triglycerides: 80 mg/dL (ref <150)
VLDL: 16 mg/dL (ref 0–40)

## 2024-04-09 LAB — ECHOCARDIOGRAM COMPLETE
AR max vel: 1.67 cm2
AV Area VTI: 1.64 cm2
AV Area mean vel: 1.7 cm2
AV Mean grad: 5 mmHg
AV Peak grad: 8.9 mmHg
Ao pk vel: 1.49 m/s
Area-P 1/2: 3.45 cm2
Calc EF: 52.5 %
MV VTI: 3.26 cm2
S' Lateral: 1.77 cm
Single Plane A2C EF: 50.9 %
Single Plane A4C EF: 52 %
Weight: 2458.57 [oz_av]

## 2024-04-09 LAB — COMPREHENSIVE METABOLIC PANEL WITH GFR
ALT: 36 U/L (ref 0–44)
AST: 35 U/L (ref 15–41)
Albumin: 3.1 g/dL — ABNORMAL LOW (ref 3.5–5.0)
Alkaline Phosphatase: 70 U/L (ref 38–126)
Anion gap: 11 (ref 5–15)
BUN: 56 mg/dL — ABNORMAL HIGH (ref 8–23)
CO2: 22 mmol/L (ref 22–32)
Calcium: 8.7 mg/dL — ABNORMAL LOW (ref 8.9–10.3)
Chloride: 104 mmol/L (ref 98–111)
Creatinine, Ser: 2.47 mg/dL — ABNORMAL HIGH (ref 0.44–1.00)
GFR, Estimated: 18 mL/min — ABNORMAL LOW (ref >60)
Glucose, Bld: 113 mg/dL — ABNORMAL HIGH (ref 70–99)
Potassium: 3.4 mmol/L — ABNORMAL LOW (ref 3.5–5.1)
Sodium: 137 mmol/L (ref 135–145)
Total Bilirubin: 0.9 mg/dL (ref 0.0–1.2)
Total Protein: 5.5 g/dL — ABNORMAL LOW (ref 6.5–8.1)

## 2024-04-09 LAB — CBC
HCT: 28.7 % — ABNORMAL LOW (ref 36.0–46.0)
Hemoglobin: 9.6 g/dL — ABNORMAL LOW (ref 12.0–15.0)
MCH: 31.7 pg (ref 26.0–34.0)
MCHC: 33.4 g/dL (ref 30.0–36.0)
MCV: 94.7 fL (ref 80.0–100.0)
Platelets: 101 K/uL — ABNORMAL LOW (ref 150–400)
RBC: 3.03 MIL/uL — ABNORMAL LOW (ref 3.87–5.11)
RDW: 13.4 % (ref 11.5–15.5)
WBC: 7.2 K/uL (ref 4.0–10.5)
nRBC: 0 % (ref 0.0–0.2)

## 2024-04-09 LAB — TROPONIN I (HIGH SENSITIVITY): Troponin I (High Sensitivity): 460 ng/L (ref <18)

## 2024-04-09 MED ORDER — PERFLUTREN LIPID MICROSPHERE
1.0000 mL | INTRAVENOUS | Status: AC | PRN
  Administered 2024-04-09: 2 mL via INTRAVENOUS

## 2024-04-09 MED ORDER — ACETAMINOPHEN 325 MG PO TABS
650.0000 mg | ORAL_TABLET | Freq: Four times a day (QID) | ORAL | Status: DC | PRN
  Administered 2024-04-11: 650 mg via ORAL
  Filled 2024-04-09: qty 2

## 2024-04-09 MED ORDER — PIPERACILLIN-TAZOBACTAM 3.375 G IVPB
3.3750 g | Freq: Two times a day (BID) | INTRAVENOUS | Status: DC
  Administered 2024-04-09: 3.375 g via INTRAVENOUS
  Filled 2024-04-09 (×2): qty 50

## 2024-04-09 MED ORDER — HEPARIN SODIUM (PORCINE) 5000 UNIT/ML IJ SOLN
5000.0000 [IU] | Freq: Three times a day (TID) | INTRAMUSCULAR | Status: DC
  Administered 2024-04-09 – 2024-04-12 (×8): 5000 [IU] via SUBCUTANEOUS
  Filled 2024-04-09 (×8): qty 1

## 2024-04-09 MED ORDER — POTASSIUM CHLORIDE CRYS ER 20 MEQ PO TBCR
40.0000 meq | EXTENDED_RELEASE_TABLET | Freq: Once | ORAL | Status: AC
  Administered 2024-04-09: 40 meq via ORAL
  Filled 2024-04-09: qty 2

## 2024-04-09 MED ORDER — ACETAMINOPHEN 650 MG RE SUPP: 650.0000 mg | Freq: Four times a day (QID) | RECTAL | Status: DC | PRN

## 2024-04-09 NOTE — Progress Notes (Addendum)
 I have seen and examined the patient. I agree with the above note with the addition of : She continues to complain of sharp spasm feeling in the left upper back area.  She thinks that is what caused her blood pressure to be so elevated on presentation.  No chest pain.  By physical exam, heart is regular with 2 out of 6 systolic murmur at the base.  Lungs with diminished breath sounds at the base.  There is no JVD and no lower extremity edema.  Recommendations: Hypertensive urgency: Triggered by uncontrolled spasm pain in her back.  Blood pressure is now well-controlled on current medications.  Acute diastolic heart failure in the setting of uncontrolled hypertension: Improved significantly with diuresis.  I agree with holding IV furosemide  today given slight worsening of renal function.  Elevated troponin is likely due to supply demand ischemia: I do not recommend ischemic cardiac evaluation.  Deatrice Cage MD, [facility] 04/09/2024 5:22 PM            Progress Note  Patient Name: Stephanie Charles Date of Encounter: 04/09/2024 [facility] Cardiologist: Evalene Lunger, MD    Interval Summary    Patient reports sharp chest pain this AM, which may be coming from her rib pain. She feels breathing is better. Blood pressures improved. She remains in 2L O2. Kidney function up this AM.  Scr 1.92>2.47. Hgb 12>9.6. UOP -3.8L.    Vital Signs Vitals:   04/08/24 2042 04/09/24 0112 04/09/24 0354 04/09/24 0441  BP: (!) 108/48 (!) 113/55  (!) 137/53  Pulse: 80 84  77  Resp: 20 19  16   Temp: 99.1 F (37.3 C) 98.6 F (37 C)  98.4 F (36.9 C)  TempSrc: Oral Oral  Oral  SpO2: 96% 97%  99%  Weight:   69.7 kg     Intake/Output Summary (Last 24 hours) at 04/09/2024 0741 Last data filed at 04/09/2024 0700 Gross per 24 hour  Intake 321.01 ml  Output 3801 ml  Net -3479.99 ml      04/09/2024    3:54 AM 02/17/2024   11:50 AM 01/12/2024    2:48 PM  Last 3 Weights  Weight (lbs) 153 lb  10.6 oz 145 lb 150 lb 6.4 oz  Weight (kg) 69.7 kg 65.772 kg 68.221 kg      Telemetry/ECG  NSR, PVCs HR 80s - Personally Reviewed  Physical Exam  GEN: No acute distress.   Neck: No JVD Cardiac: RRR, no murmurs, rubs, or gallops.  Respiratory: diminished at bases. GI: Soft, nontender, non-distended  MS: No edema  Patient Profile: Stephanie Charles is a 89 y.o. 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 difficile, CKD stage III, anemia, gout, anxiety/depression, who is being seen 04/08/2024 for the evaluation of elevated high-sensitivity troponins, hypertensive emergency, and shortness of breath at the request of Dr Soledad.  Assessment & Plan   Hypertensive Urgency - presented with BP 200/90 improved in the ER to 134/59 - PTA hydralazine  100mg  TID - hydralazine  10mg  TID restarted - Imdur  30mg  daily - Entresto  24/26mg BID - coreg  3.125mg BID - BP improved, 137/53 this AM  Flash pulmonary edema - presented with tachypneic and hypoxic - started on IV nitroglycerin , IV lasix  and placed on BIPAP with improvement - CXR showed  acute pulmonary edema, trace pleural effusion - off BIPAP - good UOP, net -3.4L -  hold IV lasix  for worsening kidney function  Elevated troponin CAD - presented with left lower rib pain, SOB, elevated BP - HS trop 80>103 - not a cath candidate due to age and debility - she reports chest/rib pain this AM. HS trop this AM - ASA 80mg  daily, Lipitor 40mg  daily, Plavix  75mg  daily  Chronic HFimpEF/ICM - EF 35-40% in 2020 - EF 55-60% in 2024 - BNP 359 - appears euvolemic - echo ordered - continue Coreg  3.125mg BID, Entresto  24/26mg BID, Imdur , Hydralazine  - PTA Torsemide  20mg  PRN -IV  lasix  20mg  BID held as above  CKD stage 3-4 - Scr baseline 1.6/1/8 - Scr 1.92>2.47, BUN 53>56 - lasix  held - daily BMET  Sepsis - IV abx per IM - no obvious infectious source   For questions or updates, please contact [facility] Please consult [email or web address] for contact info under       Signed, Cadence VEAR Fishman, PA-C

## 2024-04-09 NOTE — Progress Notes (Signed)
 Progress Note    Stephanie Charles  FMW:[id number] DOB: Jan 11, 1930  DOA: 04/08/2024 PCP: Bertrum Charlie CROME, MD      Brief Narrative:    Medical records reviewed and are as summarized below:  Stephanie Charles is a 89 y.o. female with medical history significant for LBBB, hypertension, CKD II, hyperlipidemia, hypothyroidism, anxiety, depression, CAD, Hx MI and Stent, CHF, Diverticulitis, GERD, TIA, HOH left ear (hearing aid) history of fall in March 2025 and has been having left-sided rib pain since then..  She presented to the hospital with chest pain and shortness of breath.  She also complained of left-sided chest pain that feels like muscle spasm in her chest and this has been present since her fall a few weeks ago.   Initial vital signs temperature 99.5 F, respiratory 25, heart rate 110, BP 177/70, oxygen  saturation 96% on 4 L of oxygen . Chest x-ray significant for acute pulmonary edema Elevated troponins: 80, 103 and 460 BNP 359.3   Assessment/Plan:   Principal Problem:   Acute hypoxic respiratory failure (HCC) Active Problems:   HFrEF (heart failure with reduced ejection fraction) (HCC)   Chronic kidney disease, stage 4 (severe) (HCC)   Essential hypertension   Adult hypothyroidism   HLD (hyperlipidemia)   Atypical chest pain   Pulmonary edema cardiac cause (HCC)   Conjunctivitis   Sepsis (HCC)    Body mass index is 29.03 kg/m.   Acute on chronic diastolic CHF: IV Lasix  on hold because of increasing creatinine.  Monitor BMP, daily weight and urine output. 2D echo is pending She is on carvedilol , Entresto , isosorbide  mononitrate and hydralazine .  Previous 2D echo in October 2024 showed preserved EF, mild LVH, indeterminate LV diastolic parameters, moderately dilated left atrium, mild MR.   Acute hypoxic respiratory failure: Improved.   Chest pain: This is likely musculoskeletal from previous fall. Elevated troponins: 80, 103, 460.  This probably from  demand ischemia. CAD She has been evaluated by the cardiologist.  No plan for ischemic workup. Continue aspirin , Plavix  and Lipitor.   AKI on CKD stage IV: Creatinine up from 1.9-2.47.  Lasix  on hold.  Monitor BMP.   Hypokalemia: Replete potassium and monitor levels   Suspected sepsis: Urinalysis not suggestive of UTI.  No clear evidence of pneumonia on chest x-ray.  Repeat chest x-ray post IV diuresis.  Continue IV Zosyn  for now.  Plan to discontinue antibiotics if no clear evidence of pneumonia on repeat chest x-ray.    Comorbidities include hypertension, hypothyroidism, hyperlipidemia    Diet Order             Diet Heart Room service appropriate? Yes; Fluid consistency: Thin  Diet effective now                            Consultants: Cardiologist  Procedures: None    Medications:    allopurinol   150 mg Oral Daily   amitriptyline   10 mg Oral QHS   aspirin  EC  81 mg Oral Daily   atorvastatin   40 mg Oral Daily   carvedilol   3.125 mg Oral BID WC   clopidogrel   75 mg Oral Daily   ferrous sulfate   325 mg Oral QHS   fluorometholone   1 drop Both Eyes BID   gabapentin   100 mg Oral QHS   heparin  injection (subcutaneous)  5,000 Units Subcutaneous Q8H   hydrALAZINE   10 mg Oral TID   isosorbide  mononitrate  30  mg Oral Daily   levothyroxine   137 mcg Oral QAC breakfast   lidocaine   1 patch Transdermal Q24H   melatonin  5 mg Oral QHS   mirtazapine   30 mg Oral QHS   multivitamin with minerals  1 tablet Oral QPM   pantoprazole   40 mg Oral Daily   sacubitril -valsartan   1 tablet Oral BID   sertraline   25 mg Oral Daily   tobramycin   1 drop Left Eye Q4H   Continuous Infusions:  piperacillin -tazobactam (ZOSYN )  IV Stopped (04/09/24 0917)     Anti-infectives (From admission, onward)    Start     Dose/Rate Route Frequency Ordered Stop   04/08/24 2200  piperacillin -tazobactam (ZOSYN ) IVPB 3.375 g        3.375 g 12.5 mL/hr over 240 Minutes Intravenous Every  8 hours 04/08/24 1848     04/08/24 0745  piperacillin -tazobactam (ZOSYN ) IVPB 3.375 g        3.375 g 100 mL/hr over 30 Minutes Intravenous  Once 04/08/24 0730 04/08/24 0831   04/08/24 0745  vancomycin  (VANCOREADY) IVPB 1250 mg/250 mL        1,250 mg 166.7 mL/hr over 90 Minutes Intravenous  Once 04/08/24 0743 04/08/24 1014              Family Communication/Anticipated D/C date and plan/Code Status   DVT prophylaxis: heparin  injection 5,000 Units Start: 04/09/24 2200     Code Status: Limited: Do not attempt resuscitation (DNR) -DNR-LIMITED -Do Not Intubate/DNI   Family Communication: None Disposition Plan: Plan to discharge home   Status is: Inpatient Remains inpatient appropriate because: CHF exacerbation       Subjective:   Interval events noted.  She complains of left-sided chest pain which she describes as spasms.  She said this pain has been present since she had a fall a few weeks ago.  Breathing is better.  Objective:    Vitals:   04/09/24 0112 04/09/24 0354 04/09/24 0441 04/09/24 0907  BP: (!) 113/55  (!) 137/53 139/80  Pulse: 84  77 74  Resp: 19  16   Temp: 98.6 F (37 C)  98.4 F (36.9 C) 98.4 F (36.9 C)  TempSrc: Oral  Oral   SpO2: 97%  99% 97%  Weight:  69.7 kg     No data found.   Intake/Output Summary (Last 24 hours) at 04/09/2024 1205 Last data filed at 04/09/2024 1105 Gross per 24 hour  Intake 349.71 ml  Output 2101 ml  Net -1751.29 ml   Filed Weights   04/09/24 0354  Weight: 69.7 kg    Exam:  GEN: NAD SKIN: Warm and dry neuro EYES: No pallor or icterus ENT: MMM.  No JVD CV: RRR PULM: Bibasilar rales.  No wheezing ABD: soft, ND, NT, +BS CNS: AAO x 3, non focal EXT: No edema or tenderness      Data Reviewed:   I have personally reviewed following labs and imaging studies:  Labs: Labs show the following:   Basic Metabolic Panel: Recent Labs  Lab 04/08/24 0536 04/09/24 0550  NA 137 137  K 3.5 3.4*  CL  102 104  CO2 23 22  GLUCOSE 137* 113*  BUN 53* 56*  CREATININE 1.92* 2.47*  CALCIUM  9.3 8.7*   GFR Estimated Creatinine Clearance: 13.3 mL/min (A) (by C-G formula based on SCr of 2.47 mg/dL (H)). Liver Function Tests: Recent Labs  Lab 04/08/24 0536 04/09/24 0550  AST 33 35  ALT 26 36  ALKPHOS 104 70  BILITOT 0.6 0.9  PROT 7.3 5.5*  ALBUMIN  4.2 3.1*   Recent Labs  Lab 04/08/24 0536  LIPASE 137*   No results for input(s): AMMONIA in the last 168 hours. Coagulation profile Recent Labs  Lab 04/08/24 0536  INR 1.0    CBC: Recent Labs  Lab 04/08/24 0536 04/09/24 0550  WBC 11.9* 7.2  NEUTROABS 11.0*  --   HGB 12.0 9.6*  HCT 36.2 28.7*  MCV 95.3 94.7  PLT 143* 101*   Cardiac Enzymes: No results for input(s): CKTOTAL, CKMB, CKMBINDEX, TROPONINI in the last 168 hours. BNP (last 3 results) No results for input(s): PROBNP in the last 8760 hours. CBG: No results for input(s): GLUCAP in the last 168 hours. D-Dimer: No results for input(s): DDIMER in the last 72 hours. Hgb A1c: No results for input(s): HGBA1C in the last 72 hours. Lipid Profile: Recent Labs    04/09/24 0550  CHOL 73  HDL 26*  LDLCALC 31  TRIG 80  CHOLHDL 2.8   Thyroid  function studies: No results for input(s): TSH, T4TOTAL, T3FREE, THYROIDAB in the last 72 hours.  Invalid input(s): FREET3 Anemia work up: No results for input(s): VITAMINB12, FOLATE, FERRITIN, TIBC, IRON, RETICCTPCT in the last 72 hours. Sepsis Labs: Recent Labs  Lab 04/08/24 0536 04/08/24 0730 04/08/24 1155 04/09/24 0550  PROCALCITON  --  0.52  --   --   WBC 11.9*  --   --  7.2  LATICACIDVEN 1.5  --  1.9  --     Microbiology Recent Results (from the past 240 hours)  Blood Culture (routine x 2)     Status: None (Preliminary result)   Collection Time: 04/08/24  5:36 AM   Specimen: BLOOD  Result Value Ref Range Status   Specimen Description BLOOD BLOOD RIGHT ARM  Final    Special Requests   Final    BOTTLES DRAWN AEROBIC AND ANAEROBIC Blood Culture adequate volume   Culture   Final    NO GROWTH 1 DAY Performed at [facility], [address] [place], [place] [address]    Report Status PENDING  Incomplete  Blood Culture (routine x 2)     Status: None (Preliminary result)   Collection Time: 04/08/24  5:36 AM   Specimen: BLOOD  Result Value Ref Range Status   Specimen Description BLOOD BLOOD LEFT ARM  Final   Special Requests   Final    BOTTLES DRAWN AEROBIC AND ANAEROBIC Blood Culture adequate volume   Culture   Final    NO GROWTH < 24 HOURS Performed at [facility], [address], [place], [place] [address]    Report Status PENDING  Incomplete  Resp panel by RT-PCR (RSV, Flu A&B, Covid) Anterior Nasal Swab     Status: None   Collection Time: 04/08/24  5:50 AM   Specimen: Anterior Nasal Swab  Result Value Ref Range Status   SARS Coronavirus 2 by RT PCR NEGATIVE NEGATIVE Final    Comment: (NOTE) SARS-CoV-2 target nucleic acids are NOT DETECTED.  The SARS-CoV-2 RNA is generally detectable in upper respiratory specimens during the acute phase of infection. The lowest concentration of SARS-CoV-2 viral copies this assay can detect is 138 copies/mL. A negative result does not preclude SARS-Cov-2 infection and should not be used as the sole basis for treatment or other patient management decisions. A negative result may occur with  improper specimen collection/handling, submission of specimen other than nasopharyngeal swab, presence of viral mutation(s) within the areas targeted  by this assay, and inadequate number of viral copies(<138 copies/mL). A negative result must be combined with clinical observations, patient history, and epidemiological information. The expected result is Negative.  Fact Sheet for Patients:  BloggerCourse.com  Fact Sheet for Healthcare Providers:   SeriousBroker.it  This test is no t yet approved or cleared by the [place]  FDA and  has been authorized for detection and/or diagnosis of SARS-CoV-2 by FDA under an Emergency Use Authorization (EUA). This EUA will remain  in effect (meaning this test can be used) for the duration of the COVID-19 declaration under Section 564(b)(1) of the Act, 21 U.S.C.section 360bbb-3(b)(1), unless the authorization is terminated  or revoked sooner.       Influenza A by PCR NEGATIVE NEGATIVE Final   Influenza B by PCR NEGATIVE NEGATIVE Final    Comment: (NOTE) The Xpert Xpress SARS-CoV-2/FLU/RSV plus assay is intended as an aid in the diagnosis of influenza from Nasopharyngeal swab specimens and should not be used as a sole basis for treatment. Nasal washings and aspirates are unacceptable for Xpert Xpress SARS-CoV-2/FLU/RSV testing.  Fact Sheet for Patients: BloggerCourse.com  Fact Sheet for Healthcare Providers: SeriousBroker.it  This test is not yet approved or cleared by the [place]  FDA and has been authorized for detection and/or diagnosis of SARS-CoV-2 by FDA under an Emergency Use Authorization (EUA). This EUA will remain in effect (meaning this test can be used) for the duration of the COVID-19 declaration under Section 564(b)(1) of the Act, 21 U.S.C. section 360bbb-3(b)(1), unless the authorization is terminated or revoked.     Resp Syncytial Virus by PCR NEGATIVE NEGATIVE Final    Comment: (NOTE) Fact Sheet for Patients: BloggerCourse.com  Fact Sheet for Healthcare Providers: SeriousBroker.it  This test is not yet approved or cleared by the [place]  FDA and has been authorized for detection and/or diagnosis of SARS-CoV-2 by FDA under an Emergency Use Authorization (EUA). This EUA will remain in effect (meaning this test can be used) for  the duration of the COVID-19 declaration under Section 564(b)(1) of the Act, 21 U.S.C. section 360bbb-3(b)(1), unless the authorization is terminated or revoked.  Performed at [facility], [address] [place], [place] [address]     Procedures and diagnostic studies:  DG Chest [facility] 1 View Result Date: 04/08/2024 CLINICAL DATA:  89 year old female with possible sepsis. EXAM: PORTABLE CHEST 1 VIEW COMPARISON:  Chest radiographs 01/06/2024 and earlier. FINDINGS: Portable AP upright view at 0551 hours. Improved lung volumes. Stable cardiomegaly and mediastinal contours. Calcified aortic atherosclerosis. Visualized tracheal air column is within normal limits. Chronic but increased bilateral pulmonary interstitial opacity, vascularity appears indistinct. And trace new fluid in the right minor fissure. No pneumothorax. No consolidation or layering pleural effusion identified. Paucity of bowel gas.  Stable visualized osseous structures. IMPRESSION: Evidence of Acute Pulmonary Edema, trace pleural fluid now, superimposed on chronic cardiomegaly and calcified aortic atherosclerosis. Electronically Signed   By: VEAR Hurst M.D.   On: 04/08/2024 05:59               LOS: 1 day   Hilda Rynders  [facility]   Pager on [email or web address]. If 7PM-7AM, please contact night-coverage at [email or web address]     04/09/2024, 12:05 PM

## 2024-04-09 NOTE — Progress Notes (Signed)
 Notified Dr. Jens regarding troponin of 460.

## 2024-04-10 DIAGNOSIS — I5020 Unspecified systolic (congestive) heart failure: Secondary | ICD-10-CM

## 2024-04-10 DIAGNOSIS — I161 Hypertensive emergency: Principal | ICD-10-CM

## 2024-04-10 DIAGNOSIS — J9601 Acute respiratory failure with hypoxia: Secondary | ICD-10-CM | POA: Diagnosis not present

## 2024-04-10 DIAGNOSIS — I10 Essential (primary) hypertension: Secondary | ICD-10-CM | POA: Insufficient documentation

## 2024-04-10 LAB — CBC WITH DIFFERENTIAL/PLATELET
Abs Immature Granulocytes: 0.03 K/uL (ref 0.00–0.07)
Basophils Absolute: 0 K/uL (ref 0.0–0.1)
Basophils Relative: 1 %
Eosinophils Absolute: 0.2 K/uL (ref 0.0–0.5)
Eosinophils Relative: 3 %
HCT: 28.9 % — ABNORMAL LOW (ref 36.0–46.0)
Hemoglobin: 9.5 g/dL — ABNORMAL LOW (ref 12.0–15.0)
Immature Granulocytes: 1 %
Lymphocytes Relative: 12 %
Lymphs Abs: 0.7 K/uL (ref 0.7–4.0)
MCH: 31.3 pg (ref 26.0–34.0)
MCHC: 32.9 g/dL (ref 30.0–36.0)
MCV: 95.1 fL (ref 80.0–100.0)
Monocytes Absolute: 0.2 K/uL (ref 0.1–1.0)
Monocytes Relative: 4 %
Neutro Abs: 4.2 K/uL (ref 1.7–7.7)
Neutrophils Relative %: 79 %
Platelets: 97 K/uL — ABNORMAL LOW (ref 150–400)
RBC: 3.04 MIL/uL — ABNORMAL LOW (ref 3.87–5.11)
RDW: 13.4 % (ref 11.5–15.5)
WBC: 5.3 K/uL (ref 4.0–10.5)
nRBC: 0 % (ref 0.0–0.2)

## 2024-04-10 LAB — BASIC METABOLIC PANEL WITH GFR
Anion gap: 11 (ref 5–15)
BUN: 61 mg/dL — ABNORMAL HIGH (ref 8–23)
CO2: 23 mmol/L (ref 22–32)
Calcium: 8.9 mg/dL (ref 8.9–10.3)
Chloride: 105 mmol/L (ref 98–111)
Creatinine, Ser: 2.61 mg/dL — ABNORMAL HIGH (ref 0.44–1.00)
GFR, Estimated: 17 mL/min — ABNORMAL LOW (ref >60)
Glucose, Bld: 118 mg/dL — ABNORMAL HIGH (ref 70–99)
Potassium: 4.1 mmol/L (ref 3.5–5.1)
Sodium: 139 mmol/L (ref 135–145)

## 2024-04-10 LAB — MAGNESIUM: Magnesium: 1.8 mg/dL (ref 1.7–2.4)

## 2024-04-10 MED ORDER — METHYLPREDNISOLONE SODIUM SUCC 125 MG IJ SOLR
80.0000 mg | Freq: Once | INTRAMUSCULAR | Status: AC
  Administered 2024-04-10: 80 mg via INTRAVENOUS
  Filled 2024-04-10: qty 2

## 2024-04-10 MED ORDER — DIPHENHYDRAMINE HCL 50 MG/ML IJ SOLN
25.0000 mg | Freq: Once | INTRAMUSCULAR | Status: AC
  Administered 2024-04-10: 25 mg via INTRAVENOUS
  Filled 2024-04-10: qty 1

## 2024-04-10 MED ORDER — FAMOTIDINE IN NACL 20-0.9 MG/50ML-% IV SOLN
20.0000 mg | Freq: Once | INTRAVENOUS | Status: AC
  Administered 2024-04-10: 20 mg via INTRAVENOUS
  Filled 2024-04-10: qty 50

## 2024-04-10 NOTE — Progress Notes (Signed)
 Heart Failure Nurse Navigator Progress Note  PCP: Bertrum Charlie CROME, MD PCP-Cardiologist: Evalene Lunger, MD Advanced Heart Failure: Ellouise Class, FNP (2022) Admission Diagnosis: Hypertensive Emergency Flash Pulmonary Edema(HCC) Sepsis, due to unspecified organism whether acute organ dysfunction present [facility]) Admitted from: Home via EMS  Presentation:   Stephanie Charles presented with shortness of breath, muscle spasms and chest pain. EKG with left bundle branch block.  BP 200/90, O2 Sats 85-90 on room air.  Hx: CHF, Hypertension,Hyperlipidemia, prior TIA , MI & Stent, Diverticulitis, GERD. BNP 359.0. Chest x-ray:Evidence of Acute Pulmonary Edema, trace pleural fluid, superimposed on chronic cardiomegaly and calcified aortic atherosclerosis.   ECHO/ LVEF: 45-50% Grade I Diastolic Dysfunction (impaired relaxation). Left ventricle has mildly decreased function and demonstrates global hypokinesis.  Moderate concentric left ventricular hypertrophy.  Left & right atrial size were mildly dilated.    Clinical Course:  Past Medical History:  Diagnosis Date   Anemia    C. difficile diarrhea    CHF (congestive heart failure) (HCC) 10/05/2021   Diverticulitis    GERD (gastroesophageal reflux disease)    Hyperlipidemia    Hypertension    Hypothyroidism    Myocardial infarction (HCC) 08/2017   TIA (transient ischemic attack)    1 approx 2015, 1 approx 2018   Vertigo    last episode several months ago   [name]      Social History   Socioeconomic History   Marital status: Divorced    Spouse name: na   Number of children: 4   Years of education: 14   Highest education level: Associate degree: occupational, Scientist, product/process development, or vocational program  Occupational History   Occupation: retired  Tobacco Use   Smoking status: Never   Smokeless tobacco: Never  Vaping Use   Vaping status: Never Used  Substance and Sexual Activity   Alcohol use: No    Alcohol/week: 0.0  standard drinks of alcohol   Drug use: No   Sexual activity: Never  Other Topics Concern   Not on file  Social History Narrative   Not on file   Social Drivers of Health   Financial Resource Strain: Medium Risk (04/10/2024)   Overall Financial Resource Strain (CARDIA)    Difficulty of Paying Living Expenses: Somewhat hard  Food Insecurity: Unknown (04/10/2024)   Hunger Vital Sign    Worried About Running Out of Food in the Last Year: Never true    Ran Out of Food in the Last Year: Not on file  Transportation Needs: No Transportation Needs (04/10/2024)   PRAPARE - Administrator, Civil Service (Medical): No    Lack of Transportation (Non-Medical): No  Physical Activity: Inactive (08/18/2020)   Exercise Vital Sign    Days of Exercise per Week: 0 days    Minutes of Exercise per Session: 0 min  Stress: No Stress Concern Present (08/18/2020)   [employer] of Occupational Health - Occupational Stress Questionnaire    Feeling of Stress : Not at all  Social Connections: Unknown (04/09/2024)   Social Connection and Isolation Panel    Frequency of Communication with Friends and Family: Never    Frequency of Social Gatherings with Friends and Family: Never    Attends Religious Services: Never    Database administrator or Organizations: Patient declined    Attends Banker Meetings: Patient declined    Marital Status: Patient declined   Education Assessment and Provision:  Detailed education and instructions provided on  heart failure disease management including the following:  Signs and symptoms of Heart Failure When to call the physician Importance of daily weights Low sodium diet Fluid restriction Medication management Anticipated future follow-up appointments  Patient education given on each of the above topics.  Patient acknowledges understanding via teach back method and acceptance of all instructions.  Education Materials:  Living Better With Heart  Failure Booklet, HF zone tool, & Daily Weight Tracker Tool.  Patient has scale at home: Yes Patient has pill box at home: Yes    High Risk Criteria for Readmission and/or Poor Patient Outcomes: Heart failure hospital admissions (last 6 months): 1  No Show rate: 6% Difficult social situation: None Demonstrates medication adherence: Yes Primary Language: English Literacy level: Reading, Writing & Comprehension  Barriers of Care:   Hearing impairment left ear (hearing aid)  Considerations/Referrals:  Referral made to Heart Failure Pharmacist Stewardship: Yes Referral made to Heart Failure CSW/NCM TOC: No Referral made to Heart & Vascular TOC clinic: Yes. 04/17/24 @ 3:00 PM  Items for Follow-up on DC/TOC: Diet & Fluid Restrictions Dailey Weights Continued Heart Failure Education  Charmaine Pines, RN, BSN [facility] Heart Failure Navigator Secure Chat Only

## 2024-04-10 NOTE — Progress Notes (Addendum)
  Progress Note  Patient Name: MALAIYAH ACHORN Date of Encounter: 04/10/2024 [facility] Cardiologist: Evalene Lunger, MD   Interval Summary   Feeling a bit better today, though her granddaughter at the bedside is concerned about intermittent confusion.  She complains of intermittent left rib pain with a focal area of tenderness.  Otherwise, no chest pain,  shortness of breath, or palpitations reported.  Vital Signs Vitals:   04/10/24 1130 04/10/24 1421 04/10/24 1459 04/10/24 1734  BP: (!) 133/54 (!) 141/65 (!) 141/65 (!) 141/65  Pulse: 70 85  88  Resp: 18 16    Temp: 98.1 F (36.7 C) 97.7 F (36.5 C)    TempSrc: Oral Oral    SpO2: 97% 97%    Weight:        Intake/Output Summary (Last 24 hours) at 04/10/2024 1809 Last data filed at 04/10/2024 1710 Gross per 24 hour  Intake 230 ml  Output 500 ml  Net -270 ml      04/10/2024    5:00 AM 04/09/2024    3:54 AM 02/17/2024   11:50 AM  Last 3 Weights  Weight (lbs) 149 lb 7.6 oz 153 lb 10.6 oz 145 lb  Weight (kg) 67.8 kg 69.7 kg 65.772 kg      Telemetry/ECG  Sinus rhythm - Personally Reviewed  Physical Exam  GEN: No acute distress.   Neck: No JVD Cardiac: RRR, no murmurs, rubs, or gallops.  Respiratory: Minich breath sounds at the lung bases.  No wheezes or crackles. GI: Soft, nontender, non-distended  MS: No edema  Assessment & Plan  Hypertensive emergency: Patient presenting with shortness of breath in the setting of severe hypertension, now significantly improved.  She initially required IV nitroglycerin  and BiPAP as well as aggressive diuresis.  Unfortunately, her renal function has declined further with diuretics currently on hold.  I think this is reasonable as she appears euvolemic on exam.  Continue current regimen of carvedilol , hydralazine , and isosorbide  mononitrate.  HFmrEF: Yesterday showed mildly reduced left ventricular systolic function (EF 45-50%) with global hypokinesis.  Continue blood pressure control  as well as carvedilol , as above.  If renal function improves, consider addition of ACE inhibitor/ARB in the future.  No plans for ischemia evaluation or additional diuresis at this time.  Chest wall pain: Quality of pain with reproducibility with palpation by the patient argues against cardiac etiology.  Suspect elevated high-sensitivity troponin I reflects supply-demand mismatch, though cannot entirely exclude a component of ACS.  Given her advanced age and other comorbidities, including acute kidney injury superimposed on chronic kidney disease, she is not a good candidate for invasive procedures like cardiac catheterization.  Ms. Yamaguchi agrees that she would not wish to undergo invasive procedures.  I think is reasonable to continue dual antiplatelet therapy with aspirin  and clopidogrel , which she was taking prior to arrival.  Continue atorvastatin  as well; LDL very well-controlled on admission.  Acute kidney injury superimposed on chronic kidney disease: Creatinine continue to trend.  As the patient appears fairly euvolemic on exam and demonstrates significant improvement in her respiratory status from initial presentation, I agree with deferring additional diuresis for now.  Other nephrotoxic agents should also be avoided.  For questions or updates, please contact [facility] Please consult [email or web address] for contact info under [facility].     Signed, Lonni Hanson, MD

## 2024-04-10 NOTE — Progress Notes (Signed)
 I was notified by the nurse that patient feels her tongue and lower lips are swollen, she was seen and examined, no airway compromise, no increased secretions, lips appear within normal limit tongue does not appear to be red with significant swelling but patient reports she is feels it is more swollen than her baseline, so I will give her 1 dose of IV Pepcid , IV Benadryl  and IV Solu-Medrol , and will hold her Entresto  for now, and will monitor closely.  Please see picture under media section to evaluate if there is any significant or progressive swelling open further monitoring and evaluation. Brayton Lye MD

## 2024-04-10 NOTE — Care Management Important Message (Signed)
 Important Message  Patient Details  Name: ARLENY KRUGER MRN: [id number] Date of Birth: October 03, 1930   Important Message Given:        [name] 04/10/2024, 12:18 PM

## 2024-04-10 NOTE — Progress Notes (Addendum)
 Progress Note    Stephanie Charles  FMW:[id number] DOB: 1929-11-30  DOA: 04/08/2024 PCP: Bertrum Charlie CROME, MD      Brief Narrative:    Medical records reviewed and are as summarized below:  Stephanie Charles is a 89 y.o. female with medical history significant for LBBB, hypertension, CKD II, hyperlipidemia, hypothyroidism, anxiety, depression, CAD, Hx MI and Stent, CHF, Diverticulitis, GERD, TIA, HOH left ear (hearing aid) history of fall in March 2025 and has been having left-sided rib pain since then..  She presented to the hospital with chest pain and shortness of breath.  She also complained of left-sided chest pain that feels like muscle spasm in her chest and this has been present since her fall a few weeks ago.   Initial vital signs temperature 99.5 F, respiratory 25, heart rate 110, BP 177/70, oxygen  saturation 96% on 4 L of oxygen . Chest x-ray significant for acute pulmonary edema Elevated troponins: 80, 103 and 460 BNP 359.3   Assessment/Plan:   Principal Problem:   Acute hypoxic respiratory failure (HCC) Active Problems:   HFrEF (heart failure with reduced ejection fraction) (HCC)   Chronic kidney disease, stage 4 (severe) (HCC)   Essential hypertension   Adult hypothyroidism   HLD (hyperlipidemia)   Atypical chest pain   Pulmonary edema cardiac cause (HCC)   Conjunctivitis   Sepsis (HCC)   Flash pulmonary edema (HCC)    Body mass index is 28.24 kg/m.   Acute on chronic diastolic CHF: IV Lasix  on hold because of increasing creatinine.  Monitor BMP, daily weight and urine output. 2D echo showed EF estimated at 45 to 50%, moderate concentric LVH, grade 1 diastolic dysfunction She is on carvedilol , Entresto , isosorbide  mononitrate and hydralazine .  Previous 2D echo in October 2024 showed preserved EF, mild LVH, indeterminate LV diastolic parameters, moderately dilated left atrium, mild MR.   Acute hypoxic respiratory failure: She is on 2 L/min oxygen .   Wean off oxygen  as able.   Chest pain: This is likely musculoskeletal from previous fall. Elevated troponins: 80, 103, 460.  This probably from demand ischemia. CAD She has been evaluated by the cardiologist.  No plan for ischemic workup. Continue aspirin , Plavix  and Lipitor.   AKI on CKD stage IV: Worsening AKI, creatinine up from 1.9-2.47- 2.61.  Lasix  on hold.  Monitor BMP.   Hypokalemia: Improved   Sepsis has been ruled out.  No evidence of infection thus far.  Repeat chest x-ray did not show any pneumonia.  Discontinue IV Zosyn .:    Comorbidities include hypertension, hypothyroidism, hyperlipidemia   Continue prior to admission fluorometholone  eyedrops.  Discontinue tobramycin .  Diet Order             Diet Heart Room service appropriate? Yes; Fluid consistency: Thin  Diet effective now                            Consultants: Cardiologist  Procedures: None    Medications:    allopurinol   150 mg Oral Daily   amitriptyline   10 mg Oral QHS   aspirin  EC  81 mg Oral Daily   atorvastatin   40 mg Oral Daily   carvedilol   3.125 mg Oral BID WC   clopidogrel   75 mg Oral Daily   ferrous sulfate   325 mg Oral QHS   fluorometholone   1 drop Both Eyes BID   gabapentin   100 mg Oral QHS   heparin  injection (subcutaneous)  5,000 Units Subcutaneous Q8H   hydrALAZINE   10 mg Oral TID   isosorbide  mononitrate  30 mg Oral Daily   levothyroxine   137 mcg Oral QAC breakfast   lidocaine   1 patch Transdermal Q24H   melatonin  5 mg Oral QHS   mirtazapine   30 mg Oral QHS   multivitamin with minerals  1 tablet Oral QPM   pantoprazole   40 mg Oral Daily   sertraline   25 mg Oral Daily   Continuous Infusions:     Anti-infectives (From admission, onward)    Start     Dose/Rate Route Frequency Ordered Stop   04/09/24 2200  piperacillin -tazobactam (ZOSYN ) IVPB 3.375 g  Status:  Discontinued        3.375 g 12.5 mL/hr over 240 Minutes Intravenous Every 12 hours 04/09/24  1518 04/10/24 0831   04/08/24 2200  piperacillin -tazobactam (ZOSYN ) IVPB 3.375 g  Status:  Discontinued        3.375 g 12.5 mL/hr over 240 Minutes Intravenous Every 8 hours 04/08/24 1848 04/09/24 1518   04/08/24 0745  piperacillin -tazobactam (ZOSYN ) IVPB 3.375 g        3.375 g 100 mL/hr over 30 Minutes Intravenous  Once 04/08/24 0730 04/08/24 0831   04/08/24 0745  vancomycin  (VANCOREADY) IVPB 1250 mg/250 mL        1,250 mg 166.7 mL/hr over 90 Minutes Intravenous  Once 04/08/24 0743 04/08/24 1014              Family Communication/Anticipated D/C date and plan/Code Status   DVT prophylaxis: heparin  injection 5,000 Units Start: 04/09/24 2200     Code Status: Limited: Do not attempt resuscitation (DNR) -DNR-LIMITED -Do Not Intubate/DNI   Family Communication: None Disposition Plan: Plan to discharge home   Status is: Inpatient Remains inpatient appropriate because: CHF exacerbation       Subjective:   Interval events noted.  She said she is uncomfortable with the new eyedrops that she is taking because it is different from what she takes at home.  No shortness of breath, cough, chest pain, tongue swelling.  Objective:    Vitals:   04/10/24 0843 04/10/24 1130 04/10/24 1421 04/10/24 1459  BP: (!) 155/60 (!) 133/54 (!) 141/65 (!) 141/65  Pulse:  70 85   Resp:  18 16   Temp:  98.1 F (36.7 C) 97.7 F (36.5 C)   TempSrc:  Oral Oral   SpO2:  97% 97%   Weight:       No data found.   Intake/Output Summary (Last 24 hours) at 04/10/2024 1511 Last data filed at 04/10/2024 1300 Gross per 24 hour  Intake 180 ml  Output --  Net 180 ml   Filed Weights   04/09/24 0354 04/10/24 0500  Weight: 69.7 kg 67.8 kg    Exam:  GEN: NAD SKIN: Warm and dry EYES: EOMI, PERRLA, left eye conjunctiva looks red.  No eye discharge ENT: MMM, no lip or tongue swelling noted CV: RRR PULM: Bibasilar rales.  No wheezing or rhonchi ABD: soft, ND, NT, +BS CNS: AAO x 3, non  focal EXT: No edema or tenderness     Data Reviewed:   I have personally reviewed following labs and imaging studies:  Labs: Labs show the following:   Basic Metabolic Panel: Recent Labs  Lab 04/08/24 0536 04/09/24 0550 04/10/24 0953  NA 137 137 139  K 3.5 3.4* 4.1  CL 102 104 105  CO2 23 22 23   GLUCOSE 137* 113* 118*  BUN 53*  56* 61*  CREATININE 1.92* 2.47* 2.61*  CALCIUM  9.3 8.7* 8.9  MG  --   --  1.8   GFR Estimated Creatinine Clearance: 12.4 mL/min (A) (by C-G formula based on SCr of 2.61 mg/dL (H)). Liver Function Tests: Recent Labs  Lab 04/08/24 0536 04/09/24 0550  AST 33 35  ALT 26 36  ALKPHOS 104 70  BILITOT 0.6 0.9  PROT 7.3 5.5*  ALBUMIN  4.2 3.1*   Recent Labs  Lab 04/08/24 0536  LIPASE 137*   No results for input(s): AMMONIA in the last 168 hours. Coagulation profile Recent Labs  Lab 04/08/24 0536  INR 1.0    CBC: Recent Labs  Lab 04/08/24 0536 04/09/24 0550 04/10/24 0953  WBC 11.9* 7.2 5.3  NEUTROABS 11.0*  --  4.2  HGB 12.0 9.6* 9.5*  HCT 36.2 28.7* 28.9*  MCV 95.3 94.7 95.1  PLT 143* 101* 97*   Cardiac Enzymes: No results for input(s): CKTOTAL, CKMB, CKMBINDEX, TROPONINI in the last 168 hours. BNP (last 3 results) No results for input(s): PROBNP in the last 8760 hours. CBG: No results for input(s): GLUCAP in the last 168 hours. D-Dimer: No results for input(s): DDIMER in the last 72 hours. Hgb A1c: No results for input(s): HGBA1C in the last 72 hours. Lipid Profile: Recent Labs    04/09/24 0550  CHOL 73  HDL 26*  LDLCALC 31  TRIG 80  CHOLHDL 2.8   Thyroid  function studies: No results for input(s): TSH, T4TOTAL, T3FREE, THYROIDAB in the last 72 hours.  Invalid input(s): FREET3 Anemia work up: No results for input(s): VITAMINB12, FOLATE, FERRITIN, TIBC, IRON, RETICCTPCT in the last 72 hours. Sepsis Labs: Recent Labs  Lab 04/08/24 0536 04/08/24 0730 04/08/24 1155  04/09/24 0550 04/10/24 0953  PROCALCITON  --  0.52  --   --   --   WBC 11.9*  --   --  7.2 5.3  LATICACIDVEN 1.5  --  1.9  --   --     Microbiology Recent Results (from the past 240 hours)  Blood Culture (routine x 2)     Status: None (Preliminary result)   Collection Time: 04/08/24  5:36 AM   Specimen: BLOOD  Result Value Ref Range Status   Specimen Description BLOOD BLOOD RIGHT ARM  Final   Special Requests   Final    BOTTLES DRAWN AEROBIC AND ANAEROBIC Blood Culture adequate volume   Culture   Final    NO GROWTH 2 DAYS Performed at [facility], [address] [place], [place] [address]    Report Status PENDING  Incomplete  Blood Culture (routine x 2)     Status: None (Preliminary result)   Collection Time: 04/08/24  5:36 AM   Specimen: BLOOD  Result Value Ref Range Status   Specimen Description BLOOD BLOOD LEFT ARM  Final   Special Requests   Final    BOTTLES DRAWN AEROBIC AND ANAEROBIC Blood Culture adequate volume   Culture   Final    NO GROWTH 2 DAYS Performed at [facility], [address], [place], [place] [address]    Report Status PENDING  Incomplete  Resp panel by RT-PCR (RSV, Flu A&B, Covid) Anterior Nasal Swab     Status: None   Collection Time: 04/08/24  5:50 AM   Specimen: Anterior Nasal Swab  Result Value Ref Range Status   SARS Coronavirus 2 by RT PCR NEGATIVE NEGATIVE Final    Comment: (NOTE) SARS-CoV-2 target nucleic acids are NOT DETECTED.  The SARS-CoV-2 RNA is generally detectable in upper respiratory specimens during the acute phase of infection. The lowest concentration of SARS-CoV-2 viral copies this assay can detect is 138 copies/mL. A negative result does not preclude SARS-Cov-2 infection and should not be used as the sole basis for treatment or other patient management decisions. A negative result may occur with  improper specimen collection/handling, submission of specimen other than nasopharyngeal swab, presence of  viral mutation(s) within the areas targeted by this assay, and inadequate number of viral copies(<138 copies/mL). A negative result must be combined with clinical observations, patient history, and epidemiological information. The expected result is Negative.  Fact Sheet for Patients:  BloggerCourse.com  Fact Sheet for Healthcare Providers:  SeriousBroker.it  This test is no t yet approved or cleared by the [place]  FDA and  has been authorized for detection and/or diagnosis of SARS-CoV-2 by FDA under an Emergency Use Authorization (EUA). This EUA will remain  in effect (meaning this test can be used) for the duration of the COVID-19 declaration under Section 564(b)(1) of the Act, 21 U.S.C.section 360bbb-3(b)(1), unless the authorization is terminated  or revoked sooner.       Influenza A by PCR NEGATIVE NEGATIVE Final   Influenza B by PCR NEGATIVE NEGATIVE Final    Comment: (NOTE) The Xpert Xpress SARS-CoV-2/FLU/RSV plus assay is intended as an aid in the diagnosis of influenza from Nasopharyngeal swab specimens and should not be used as a sole basis for treatment. Nasal washings and aspirates are unacceptable for Xpert Xpress SARS-CoV-2/FLU/RSV testing.  Fact Sheet for Patients: BloggerCourse.com  Fact Sheet for Healthcare Providers: SeriousBroker.it  This test is not yet approved or cleared by the [place]  FDA and has been authorized for detection and/or diagnosis of SARS-CoV-2 by FDA under an Emergency Use Authorization (EUA). This EUA will remain in effect (meaning this test can be used) for the duration of the COVID-19 declaration under Section 564(b)(1) of the Act, 21 U.S.C. section 360bbb-3(b)(1), unless the authorization is terminated or revoked.     Resp Syncytial Virus by PCR NEGATIVE NEGATIVE Final    Comment: (NOTE) Fact Sheet for  Patients: BloggerCourse.com  Fact Sheet for Healthcare Providers: SeriousBroker.it  This test is not yet approved or cleared by the [place]  FDA and has been authorized for detection and/or diagnosis of SARS-CoV-2 by FDA under an Emergency Use Authorization (EUA). This EUA will remain in effect (meaning this test can be used) for the duration of the COVID-19 declaration under Section 564(b)(1) of the Act, 21 U.S.C. section 360bbb-3(b)(1), unless the authorization is terminated or revoked.  Performed at [facility], [address] [place], [place] [address]     Procedures and diagnostic studies:  DG Chest 2 View Result Date: 04/09/2024 CLINICAL DATA:  Follow-up edema or pneumonia EXAM: CHEST - 2 VIEW COMPARISON:  04/08/2024 01/06/2024 FINDINGS: Cardiomegaly with aortic atherosclerosis. Small bilateral effusions. Decreased edema compared to prior. No focal airspace disease. IMPRESSION: Cardiomegaly with small effusions. Decreased edema compared to prior. Electronically Signed   By: Luke Bun M.D.   On: 04/09/2024 20:22   ECHOCARDIOGRAM COMPLETE Result Date: 04/09/2024    ECHOCARDIOGRAM REPORT   Patient Name:   Stephanie Charles Date of Exam: 04/09/2024 Medical Rec #:  [id number]      Height:       61.0 in Accession #:    [id number]     Weight:       153.7 lb Date of Birth:  [birth year]  BSA:          1.689 m Patient Age:    89 years       BP:           118/48 mmHg Patient Gender: F              HR:           77 bpm. Exam Location:  [facility] Procedure: 2D Echo, Cardiac Doppler, Color Doppler and Intracardiac            Opacification Agent (Both Spectral and Color Flow Doppler were            utilized during procedure). Indications:     Chest Pain R07.9  History:         Patient has prior history of Echocardiogram examinations, most                  recent 07/29/2023. Signs/Symptoms:Chest Pain.  Sonographer:     Ashley McNeely-Sloane  Referring Phys:  [id number] AVA SWAYZE Diagnosing Phys: Cara JONETTA Lovelace MD IMPRESSIONS  1. Left ventricular ejection fraction, by estimation, is 45 to 50%. The left ventricle has mildly decreased function. The left ventricle demonstrates global hypokinesis. There is moderate concentric left ventricular hypertrophy. Left ventricular diastolic parameters are consistent with Grade I diastolic dysfunction (impaired relaxation).  2. Right ventricular systolic function is normal. The right ventricular size is normal.  3. Left atrial size was mildly dilated.  4. Right atrial size was mildly dilated.  5. The mitral valve is grossly normal. Trivial mitral valve regurgitation.  6. The aortic valve is normal in structure. Aortic valve regurgitation is not visualized. FINDINGS  Left Ventricle: Left ventricular ejection fraction, by estimation, is 45 to 50%. The left ventricle has mildly decreased function. The left ventricle demonstrates global hypokinesis. Definity  contrast agent was given IV to delineate the left ventricular  endocardial borders. Strain was performed and the global longitudinal strain is indeterminate. Global longitudinal strain performed but not reported based on interpreter judgement due to suboptimal tracking. The left ventricular internal cavity size was  normal in size. There is moderate concentric left ventricular hypertrophy. Left ventricular diastolic parameters are consistent with Grade I diastolic dysfunction (impaired relaxation). Right Ventricle: The right ventricular size is normal. No increase in right ventricular wall thickness. Right ventricular systolic function is normal. Left Atrium: Left atrial size was mildly dilated. Right Atrium: Right atrial size was mildly dilated. Pericardium: There is no evidence of pericardial effusion. Mitral Valve: The mitral valve is grossly normal. Trivial mitral valve regurgitation. MV peak gradient, 4.6 mmHg. The mean mitral valve gradient is 2.0 mmHg. Tricuspid  Valve: The tricuspid valve is normal in structure. Tricuspid valve regurgitation is mild. Aortic Valve: The aortic valve is normal in structure. Aortic valve regurgitation is not visualized. Aortic valve mean gradient measures 5.0 mmHg. Aortic valve peak gradient measures 8.9 mmHg. Aortic valve area, by VTI measures 1.64 cm. Pulmonic Valve: The pulmonic valve was normal in structure. Pulmonic valve regurgitation is trivial. Aorta: The ascending aorta was not well visualized. IAS/Shunts: No atrial level shunt detected by color flow Doppler. Additional Comments: 3D was performed not requiring image post processing on an independent workstation and was indeterminate.  LEFT VENTRICLE PLAX 2D LVIDd:         3.60 cm     Diastology LVIDs:         1.77 cm     LV e' medial:    3.00 cm/s LV PW:  1.94 cm     LV E/e' medial:  20.7 LV IVS:        1.41 cm     LV e' lateral:   6.55 cm/s LVOT diam:     1.90 cm     LV E/e' lateral: 9.5 LV SV:         59 LV SV Index:   35 LVOT Area:     2.84 cm  LV Volumes (MOD) LV vol d, MOD A2C: 64.1 ml LV vol d, MOD A4C: 78.2 ml LV vol s, MOD A2C: 31.5 ml LV vol s, MOD A4C: 37.5 ml LV SV MOD A2C:     32.6 ml LV SV MOD A4C:     78.2 ml LV SV MOD BP:      38.2 ml RIGHT VENTRICLE RV S prime:     8.80 cm/s TAPSE (M-mode): 1.8 cm LEFT ATRIUM              Index        RIGHT ATRIUM           Index LA diam:        4.50 cm  2.66 cm/m   RA Area:     11.80 cm LA Vol (A2C):   110.0 ml 65.14 ml/m  RA Volume:   27.70 ml  16.40 ml/m LA Vol (A4C):   133.0 ml 78.76 ml/m LA Biplane Vol: 128.0 ml 75.80 ml/m  AORTIC VALVE                     PULMONIC VALVE AV Area (Vmax):    1.67 cm      PV Vmax:          1.29 m/s AV Area (Vmean):   1.70 cm      PV Vmean:         86.300 cm/s AV Area (VTI):     1.64 cm      PV VTI:           0.235 m AV Vmax:           149.00 cm/s   PV Peak grad:     6.7 mmHg AV Vmean:          110.000 cm/s  PV Mean grad:     3.0 mmHg AV VTI:            0.362 m       PR End Diast Vel:  3.59 msec AV Peak Grad:      8.9 mmHg      RVOT Peak grad:   4 mmHg AV Mean Grad:      5.0 mmHg LVOT Vmax:         87.90 cm/s LVOT Vmean:        66.000 cm/s LVOT VTI:          0.209 m LVOT/AV VTI ratio: 0.58  AORTA Ao Root diam: 2.50 cm Ao Asc diam:  1.95 cm MITRAL VALVE MV Area (PHT): 3.45 cm    SHUNTS MV Area VTI:   3.26 cm    Systemic VTI:  0.21 m MV Peak grad:  4.6 mmHg    Systemic Diam: 1.90 cm MV Mean grad:  2.0 mmHg    Pulmonic VTI:  0.149 m MV Vmax:       1.07 m/s MV Vmean:      69.3 cm/s MV Decel Time: 220 msec MV E velocity: 62.10 cm/s MV A velocity: 87.80 cm/s MV E/A  ratio:  0.71 Cara JONETTA Lovelace MD Electronically signed by Cara JONETTA Lovelace MD Signature Date/Time: 04/09/2024/4:54:44 PM    Final                LOS: 2 days   Esteen Delpriore  [facility]   Pager on [email or web address]. If 7PM-7AM, please contact night-coverage at [email or web address]     04/10/2024, 3:11 PM

## 2024-04-11 ENCOUNTER — Encounter: Payer: Self-pay | Admitting: Cardiovascular Disease

## 2024-04-11 DIAGNOSIS — N178 Other acute kidney failure: Secondary | ICD-10-CM

## 2024-04-11 DIAGNOSIS — J9601 Acute respiratory failure with hypoxia: Secondary | ICD-10-CM | POA: Diagnosis not present

## 2024-04-11 DIAGNOSIS — N184 Chronic kidney disease, stage 4 (severe): Secondary | ICD-10-CM | POA: Diagnosis not present

## 2024-04-11 DIAGNOSIS — I429 Cardiomyopathy, unspecified: Secondary | ICD-10-CM

## 2024-04-11 DIAGNOSIS — I5043 Acute on chronic combined systolic (congestive) and diastolic (congestive) heart failure: Secondary | ICD-10-CM | POA: Diagnosis not present

## 2024-04-11 LAB — RENAL FUNCTION PANEL
Albumin: 3.2 g/dL — ABNORMAL LOW (ref 3.5–5.0)
Anion gap: 11 (ref 5–15)
BUN: 67 mg/dL — ABNORMAL HIGH (ref 8–23)
CO2: 23 mmol/L (ref 22–32)
Calcium: 9 mg/dL (ref 8.9–10.3)
Chloride: 105 mmol/L (ref 98–111)
Creatinine, Ser: 2.73 mg/dL — ABNORMAL HIGH (ref 0.44–1.00)
GFR, Estimated: 16 mL/min — ABNORMAL LOW (ref >60)
Glucose, Bld: 127 mg/dL — ABNORMAL HIGH (ref 70–99)
Phosphorus: 4 mg/dL (ref 2.5–4.6)
Potassium: 4.4 mmol/L (ref 3.5–5.1)
Sodium: 139 mmol/L (ref 135–145)

## 2024-04-11 LAB — CBC
HCT: 28.4 % — ABNORMAL LOW (ref 36.0–46.0)
Hemoglobin: 9.4 g/dL — ABNORMAL LOW (ref 12.0–15.0)
MCH: 31 pg (ref 26.0–34.0)
MCHC: 33.1 g/dL (ref 30.0–36.0)
MCV: 93.7 fL (ref 80.0–100.0)
Platelets: 113 K/uL — ABNORMAL LOW (ref 150–400)
RBC: 3.03 MIL/uL — ABNORMAL LOW (ref 3.87–5.11)
RDW: 13 % (ref 11.5–15.5)
WBC: 5.2 K/uL (ref 4.0–10.5)
nRBC: 0 % (ref 0.0–0.2)

## 2024-04-11 MED ORDER — HYDRALAZINE HCL 50 MG PO TABS
50.0000 mg | ORAL_TABLET | Freq: Three times a day (TID) | ORAL | Status: DC
  Administered 2024-04-11 – 2024-04-12 (×4): 50 mg via ORAL
  Filled 2024-04-11 (×4): qty 1

## 2024-04-11 MED ORDER — ISOSORBIDE MONONITRATE ER 60 MG PO TB24
60.0000 mg | ORAL_TABLET | Freq: Two times a day (BID) | ORAL | Status: DC
  Administered 2024-04-11 – 2024-04-12 (×3): 60 mg via ORAL
  Filled 2024-04-11 (×3): qty 1

## 2024-04-11 MED ORDER — ALBUMIN HUMAN 25 % IV SOLN
25.0000 g | Freq: Once | INTRAVENOUS | Status: AC
  Administered 2024-04-11: 25 g via INTRAVENOUS
  Filled 2024-04-11: qty 100

## 2024-04-11 MED ORDER — LOSARTAN POTASSIUM 25 MG PO TABS
25.0000 mg | ORAL_TABLET | Freq: Every day | ORAL | Status: DC
  Administered 2024-04-11 – 2024-04-12 (×2): 25 mg via ORAL
  Filled 2024-04-11 (×2): qty 1

## 2024-04-11 NOTE — Progress Notes (Signed)
  Progress Note   Patient: Stephanie Charles FMW:[id number] DOB: May 17, 1930 DOA: 04/08/2024     3 DOS: the patient was seen and examined on 04/11/2024   Brief hospital course: Stephanie Charles is a 89 y.o. female with medical history significant for LBBB, hypertension, CKD II, hyperlipidemia, hypothyroidism, anxiety, depression, CAD, Hx MI and Stent, CHF, Diverticulitis, GERD, TIA, HOH left ear (hearing aid) history of fall in March 2025 and has been having left-sided rib pain since then..  She presented to the hospital with chest pain and shortness of breath.  Chest x-ray showed acute pulmonary edema.  BNP 359, troponin 468.  She is diagnosed with acute congestive heart failure, started on IV Lasix  daily, but since has been on hold due to worsening renal function   Principal Problem:   Acute hypoxic respiratory failure (HCC) Active Problems:   HFrEF (heart failure with reduced ejection fraction) (HCC)   Chronic kidney disease, stage 4 (severe) (HCC)   Essential hypertension   Adult hypothyroidism   HLD (hyperlipidemia)   Atypical chest pain   Pulmonary edema cardiac cause (HCC)   Conjunctivitis   Sepsis (HCC)   Flash pulmonary edema (HCC)   Malignant hypertension   Assessment and Plan: Acute on chronic combined systolic and diastolic CHF: Acute hypoxemic respiratory failure secondary to exacerbation congestive heart failure Repeat troponin secondary to demand ischemia. 2D echo showed EF estimated at 45 to 50%, moderate concentric LVH, grade 1 diastolic dysfunction She is on carvedilol , Entresto , isosorbide  mononitrate and hydralazine . Volume status has been better, she is off oxygen .  But function progressively getting worse, Lasix  has been on hold.     AKI on CKD stage IV:  Hypokalemia Function gradually getting worse, Lasix  has been on hold.  I will give 25 g albumin .  Recheck a BMP tomorrow     Sepsis has been ruled out.  No evidence of infection thus far.  Repeat chest x-ray did  not show any pneumonia.    Essential hypertension.  Continue current treatment      Subjective:  Doing well today, short of breath much improved  Physical Exam: Vitals:   04/11/24 0427 04/11/24 0500 04/11/24 0850 04/11/24 1633  BP: (!) 166/75  (!) 170/63 (!) 138/51  Pulse: 80  77 (!) 58  Resp: 17  18 18   Temp: 97.7 F (36.5 C)  97.6 F (36.4 C) (!) 97.5 F (36.4 C)  TempSrc:      SpO2: 95%  99% 97%  Weight:  67.4 kg     General exam: Appears calm and comfortable  Respiratory system: Clear to auscultation. Respiratory effort normal. Cardiovascular system: S1 & S2 heard, RRR. No JVD, murmurs, rubs, gallops or clicks. No pedal edema. Gastrointestinal system: Abdomen is nondistended, soft and nontender. No organomegaly or masses felt. Normal bowel sounds heard. Central nervous system: Alert and oriented. No focal neurological deficits. Extremities: Symmetric 5 x 5 power. Skin: No rashes, lesions or ulcers Psychiatry: Judgement and insight appear normal. Mood & affect appropriate.    Data Reviewed:  Results reviewed with  Family Communication: None  Disposition: Status is: Inpatient Remains inpatient appropriate because: Severity of disease, IV treatment     Time spent: 35 minutes  Author: Murvin Mana, MD 04/11/2024 4:40 PM  For on call review [email or web address].

## 2024-04-11 NOTE — Hospital Course (Addendum)
 Stephanie Charles is a 89 y.o. female with medical history significant for LBBB, hypertension, CKD II, hyperlipidemia, hypothyroidism, anxiety, depression, CAD, Hx MI and Stent, CHF, Diverticulitis, GERD, TIA, HOH left ear (hearing aid) history of fall in March 2025 and has been having left-sided rib pain since then..  She presented to the hospital with chest pain and shortness of breath.  Chest x-ray showed acute pulmonary edema.  BNP 359, troponin 468.  She is diagnosed with acute congestive heart failure, started on IV Lasix  daily, but since has been on hold due to worsening renal function. Renal function finally improving. She is stable for discharge

## 2024-04-11 NOTE — Progress Notes (Signed)
  Progress Note  Patient Name: Stephanie Charles Date of Encounter: 04/11/2024 [facility] Cardiologist: Timothy Gollan, MD   Interval Summary    BP is still a little high. Scr/BUN mildly up today. The patient denies chest pain. She is still on supplemental O2.   Vital Signs Vitals:   04/10/24 2100 04/11/24 0024 04/11/24 0427 04/11/24 0500  BP: (!) 151/61 (!) 137/57 (!) 166/75   Pulse:  82 80   Resp:  15 17   Temp:  97.7 F (36.5 C) 97.7 F (36.5 C)   TempSrc:  Oral    SpO2:  97% 95%   Weight:    67.4 kg    Intake/Output Summary (Last 24 hours) at 04/11/2024 0827 Last data filed at 04/10/2024 1710 Gross per 24 hour  Intake 50 ml  Output 500 ml  Net -450 ml      04/11/2024    5:00 AM 04/10/2024    5:00 AM 04/09/2024    3:54 AM  Last 3 Weights  Weight (lbs) 148 lb 9.4 oz 149 lb 7.6 oz 153 lb 10.6 oz  Weight (kg) 67.4 kg 67.8 kg 69.7 kg      Telemetry/ECG  Nsr, PACs. PVCs, 3 beats NSVT - Personally Reviewed  Physical Exam  GEN: No acute distress.   Neck: No JVD Cardiac: RRR, no murmurs, rubs, or gallops.  Respiratory: Clear to auscultation bilaterally. GI: Soft, nontender, non-distended  MS: No edema   Patient Profile: Stephanie Charles is a 89 y.o. 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 difficile, CKD stage III, anemia, gout, anxiety/depression, who is being seen 04/08/2024 for the evaluation of elevated high-sensitivity troponins, hypertensive emergency, and shortness of breath at the request of Dr Soledad.  Assessment & Plan   Hypertensive Urgency - presented with BP 200/90 improved in the ER to 134/59 - BP this AM 166/75 - hydralazine  10mg  TID restarted>increase to 50mg  TID -  Imdur  30mg  daily>increase to 60mg  BID - patient reported tongue swelling with Entresto  (although none was observed) - trial Losartan  25mg  daily - coreg  3.125mg BID   Flash pulmonary edema - presented with tachypneic and hypoxic started on IV nitroglycerin , IV lasix  and placed on BIPAP with improvement - CXR showed acute pulmonary edema, trace pleural effusion - off BIPAP -  IV lasix  held 7/7 worsening kidney function - good UOP, net -4L   Elevated troponin/chest wall pain CAD - presented with left lower rib pain, SOB, elevated BP - HS trop 80>103>460 - not a cath candidate due to age and debility - continue ASA 80mg  daily, Lipitor 40mg  daily, Plavix  75mg  daily   Chronic HFimpEF/ICM - EF 35-40% in 2020 - EF 55-60% in 2024 - echo this admission showed LVEF 45-50%, global HK, G1DD - BNP 359 - appears euvolemic - continue Coreg  , Imdur , Hydralazine , and Losartan  - PTA Torsemide  20mg  PRN -IV lasix  held as above   AKI on CKD stage 3-4 - Scr baseline 1.6-1/8 - Scr 1.92>2.47>2.61>2.73 - daily BMET   Sepsis - IV abx per IM - no obvious infectious source    For questions or updates, please contact [facility] Please consult [email or web address] for contact info under       Signed, Stephanie Varricchio VEAR Fishman, PA-C

## 2024-04-11 NOTE — TOC Progression Note (Signed)
 Transition of Care [facility]) - Progression Note    Patient Details  Name: Stephanie Charles MRN: [id number] Date of Birth: Nov 12, 1929  [facility] of [facility]) CM/SW Contact  Tomasa JAYSON Childes, RN Phone Number: 04/11/2024, 9:49 AM  Clinical Narrative:    TOC continuing to follow patient's progress throughout discharge planning.        Expected Discharge Plan and Services                                               Social Determinants of Health (SDOH) Interventions SDOH Screenings   Food Insecurity: Unknown (04/10/2024)  Housing: Unknown (04/10/2024)  Transportation Needs: No Transportation Needs (04/10/2024)  Utilities: Not At Risk (04/09/2024)  Alcohol Screen: Low Risk  (06/03/2022)  Depression (PHQ2-9): Low Risk  (06/03/2022)  Financial Resource Strain: Medium Risk (04/10/2024)  Physical Activity: Inactive (08/18/2020)  Social Connections: Unknown (04/09/2024)  Stress: No Stress Concern Present (08/18/2020)  Tobacco Use: Low Risk  (02/17/2024)    Readmission Risk Interventions     No data to display

## 2024-04-11 NOTE — Care Management Important Message (Signed)
 Important Message  Patient Details  Name: Stephanie Charles MRN: [id number] Date of Birth: 1930-04-16   Important Message Given:  Yes - Medicare IM     [name] 04/11/2024, 12:00 PM

## 2024-04-11 NOTE — Evaluation (Signed)
 Physical Therapy Evaluation Patient Details Name: Stephanie Charles MRN: [id number] DOB: 1930/04/18 Today's Date: 04/11/2024  History of Present Illness  Pt is a 89 y.o. 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 difficile, CKD stage III, anemia, gout, anxiety/depression, who is being seen 04/08/2024 for the evaluation of elevated high-sensitivity troponins, hypertensive emergency, and shortness of breath.   Clinical Impression  Pt sleeping soundly, wakes to touch. Oriented to self, place, year (initially stated it was December 20th 2025). At baseline she stated she lives with family, modI-I for ADLs and ambulation (stopped using her rollator, reported that she strengthened herself enough to not need it). Family performs IADLs as needed.  The patient demonstrated modI for bed mobility. Attempted transfers with RW, CGA-minA for steadying, reliant on at least SUE support. Improved to CGA with RW and able to ambulate ~171ft. No LOB, and pt monitored on room air, spO2 >90%, RN notified and consented to pt being left on room air. Pt appears to be nearing baseline level of function but would benefit from further skilled PT intervention to maximize safety and outcomes.       If plan is discharge home, recommend the following: Assistance with cooking/housework;Assist for transportation;Help with stairs or ramp for entrance   Can travel by private vehicle        Equipment Recommendations None recommended by PT (pt has rollator and agreeable to use at discharge)  Recommendations for Other Services       Functional Status Assessment Patient has had a recent decline in their functional status and demonstrates the ability to  make significant improvements in function in a reasonable and predictable amount of time.     Precautions / Restrictions Precautions Precautions: Fall Recall of Precautions/Restrictions: Intact Restrictions Weight Bearing Restrictions Per Provider Order: No      Mobility  Bed Mobility Overal bed mobility: Modified Independent                  Transfers Overall transfer level: Needs assistance Equipment used: Rolling walker (2 wheels), None Transfers: Sit to/from Stand Sit to Stand: Contact guard assist, Min assist, Supervision           General transfer comment: without RW, minA for steadying, decreased to CGA    Ambulation/Gait Ambulation/Gait assistance: Contact guard assist Gait Distance (Feet): 100 Feet Assistive device: Rolling walker (2 wheels)   Gait velocity: decreased     General Gait Details: spO2 on room air >90%, pt denied lightheadedness/dizziness. some fatigue at end of activity, no LOB with RW  Stairs            Wheelchair Mobility     Tilt Bed    Modified Rankin (Stroke Patients Only)       Balance Overall balance assessment: Needs assistance Sitting-balance support: Feet supported Sitting balance-Leahy Scale: Fair     Standing balance support: Bilateral upper extremity supported, During functional activity Standing balance-Leahy Scale: Fair Standing balance comment: improved balance to fair with BUe support                             Pertinent Vitals/Pain Pain Assessment Pain Assessment: No/denies pain    Home Living Family/patient expects to be discharged to:: Private residence  Living Arrangements: Children;Other relatives (daughter, and grandchildren) Available Help at Discharge: Family Type of Home: House Home Access: Stairs to enter   [employer] of Steps: single threshold step   Home Layout: Able to live on main level with bedroom/bathroom;Two level Home Equipment: Rollator (4  wheels);Cane - single point;Shower seat - built in;Hand held shower head      Prior Function               Mobility Comments: Ambulatory without AD due pt strengthening herself enough to not need her walker ADLs Comments: pt reported modI/I for ADLs     Extremity/Trunk Assessment   Upper Extremity Assessment Upper Extremity Assessment: Generalized weakness    Lower Extremity Assessment Lower Extremity Assessment: Generalized weakness       Communication        Cognition Arousal: Alert Behavior During Therapy: WFL for tasks assessed/performed   PT - Cognitive impairments: No apparent impairments                                 Cueing       General Comments      Exercises     Assessment/Plan    PT Assessment Patient needs continued PT services  PT Problem List Decreased activity tolerance;Decreased balance;Decreased mobility;Decreased strength       PT Treatment Interventions Balance training;DME instruction;Gait training;Neuromuscular re-education;Stair training;Functional mobility training;Patient/family education;Therapeutic activities;Therapeutic exercise    PT Goals (Current goals can be found in the Care Plan section)  Acute Rehab PT Goals Patient Stated Goal: to go home PT Goal Formulation: With patient Time For Goal Achievement: 04/25/24 Potential to Achieve Goals: Good    Frequency Min 3X/week     Co-evaluation               AM-PAC PT 6 Clicks Mobility  Outcome Measure Help needed turning from your back to your side while in a flat bed without using bedrails?: None Help needed moving from lying on your back to sitting on the side of a flat bed without using bedrails?: None Help needed moving to and from a bed to a chair (including a wheelchair)?: A Little Help needed standing up from a chair using your arms (e.g., wheelchair or bedside chair)?: A Little Help needed to walk in hospital room?: A Little Help needed  climbing 3-5 steps with a railing? : A Little 6 Click Score: 20    End of Session Equipment Utilized During Treatment: Gait belt Activity Tolerance: Patient tolerated treatment well Patient left: in chair;with chair alarm set;with call bell/phone within reach Nurse Communication: Mobility status PT Visit Diagnosis: Other abnormalities of gait and mobility (R26.89);Difficulty in walking, not elsewhere classified (R26.2);Muscle weakness (generalized) (M62.81)    Time: 8597-8575 PT Time Calculation (min) (ACUTE ONLY): 22 min   Charges:   PT Evaluation $PT Eval Low Complexity: 1 Low PT Treatments $Therapeutic Activity: 8-22 mins PT General Charges $$ ACUTE PT VISIT: 1 Visit         Doyal Shams PT, DPT 3:02 PM,04/11/24

## 2024-04-12 ENCOUNTER — Other Ambulatory Visit: Payer: Self-pay

## 2024-04-12 DIAGNOSIS — N178 Other acute kidney failure: Secondary | ICD-10-CM | POA: Diagnosis not present

## 2024-04-12 DIAGNOSIS — I5043 Acute on chronic combined systolic (congestive) and diastolic (congestive) heart failure: Secondary | ICD-10-CM | POA: Diagnosis not present

## 2024-04-12 DIAGNOSIS — N184 Chronic kidney disease, stage 4 (severe): Secondary | ICD-10-CM | POA: Diagnosis not present

## 2024-04-12 DIAGNOSIS — J9601 Acute respiratory failure with hypoxia: Secondary | ICD-10-CM | POA: Diagnosis not present

## 2024-04-12 LAB — BASIC METABOLIC PANEL WITH GFR
Anion gap: 10 (ref 5–15)
BUN: 76 mg/dL — ABNORMAL HIGH (ref 8–23)
CO2: 23 mmol/L (ref 22–32)
Calcium: 9 mg/dL (ref 8.9–10.3)
Chloride: 105 mmol/L (ref 98–111)
Creatinine, Ser: 2.68 mg/dL — ABNORMAL HIGH (ref 0.44–1.00)
GFR, Estimated: 16 mL/min — ABNORMAL LOW (ref >60)
Glucose, Bld: 90 mg/dL (ref 70–99)
Potassium: 3.9 mmol/L (ref 3.5–5.1)
Sodium: 138 mmol/L (ref 135–145)

## 2024-04-12 LAB — MRSA NEXT GEN BY PCR, NASAL: MRSA by PCR Next Gen: NOT DETECTED

## 2024-04-12 LAB — MAGNESIUM: Magnesium: 2 mg/dL (ref 1.7–2.4)

## 2024-04-12 MED ORDER — HYDRALAZINE HCL 50 MG PO TABS: 100.0000 mg | ORAL_TABLET | Freq: Three times a day (TID) | ORAL | Status: DC

## 2024-04-12 MED ORDER — LOSARTAN POTASSIUM 25 MG PO TABS
25.0000 mg | ORAL_TABLET | Freq: Every day | ORAL | 0 refills | Status: DC
  Filled 2024-04-12: qty 30, 30d supply, fill #0

## 2024-04-12 NOTE — Progress Notes (Signed)
 Progress Note  Patient Name: Stephanie Charles Date of Encounter: 04/12/2024 [facility] Cardiologist: Evalene Lunger, MD   Interval Summary    Pressures still elevated. Kidney function stable. She still has occasional rib/chest pain, suspect MSK. Plan for possible d/c.  Vital Signs Vitals:   04/12/24 0000 04/12/24 0429 04/12/24 0545 04/12/24 0749  BP: (!) 155/68 (!) 156/61  (!) 162/58  Pulse: 72 73  68  Resp: 18 18  15   Temp: (!) 97.5 F (36.4 C) (!) 97.1 F (36.2 C)  (!) 97.5 F (36.4 C)  TempSrc:    Oral  SpO2: 93% 90%  96%  Weight:   67.2 kg     Intake/Output Summary (Last 24 hours) at 04/12/2024 1107 Last data filed at 04/11/2024 2300 Gross per 24 hour  Intake --  Output 900 ml  Net -900 ml      04/12/2024    5:45 AM 04/11/2024    5:00 AM 04/10/2024    5:00 AM  Last 3 Weights  Weight (lbs) 148 lb 2.4 oz 148 lb 9.4 oz 149 lb 7.6 oz  Weight (kg) 67.2 kg 67.4 kg 67.8 kg      Telemetry/ECG  NSR 60-70s - Personally Reviewed  Physical Exam  GEN: No acute distress.   Neck: No JVD Cardiac: RRR, no murmurs, rubs, or gallops.  Respiratory: Clear to auscultation bilaterally. GI: Soft, nontender, non-distended  MS: No edema  Assessment & Plan   Patient Profile: Stephanie Charles is a 89 y.o. 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 difficile, CKD stage III, anemia, gout, anxiety/depression, who is being seen 04/08/2024 for the evaluation of elevated high-sensitivity troponins, hypertensive emergency, and shortness of breath at the request of Dr Soledad.   Assessment & Plan    Hypertensive Urgency - presented with BP 200/90 improved in the ER to 134/59 - BP this AM 162/58 -  hydralazine  50mg  TID>increase to 100mg  TID - Imdur  60mg  BID - patient reported tongue swelling with Entresto  (although none was observed-suspected from Bipap) - continue Losartan  25mg  daily - conitnue coreg  3.125mg BID   Flash pulmonary edema - presented with tachypneic and hypoxic started on IV nitroglycerin , IV lasix  and placed on BIPAP with improvement - CXR showed acute pulmonary edema, trace pleural effusion - off BIPAPm on RA -  IV lasix  held 7/7 worsening kidney function - good UOP, net -4.3L   Elevated troponin/chest wall pain CAD - presented with left lower rib pain, SOB, elevated BP - HS trop 80>103>460 - not a cath candidate due to age and debility. Chest pain suspected from ribs/MSK - continue ASA 80mg  daily, Lipitor 40mg  daily, Plavix  75mg  daily   Chronic HFimpEF/ICM - EF 35-40% in 2020 - EF 55-60% in 2024 - echo this admission showed LVEF 45-50%, global HK, G1DD - BNP 359 - appears euvolemic - continue Coreg  , Imdur , Hydralazine , and Losartan  - PTA Torsemide  20mg  PRN - IV lasix  held as above   AKI on CKD stage 3-4 - Scr baseline 1.6-1/8 - Scr 1.92>2.47>2.61>2.73>2.68 - continue to hold diuretic - daily BMET   Sepsis - ruled out - no obvious infectious source      For questions or updates, please contact [facility] Please consult [email or web address] for contact info under  Signed, Olon Russ VEAR Fishman, PA-C

## 2024-04-12 NOTE — TOC Transition Note (Signed)
 Transition of Care [facility]) - Discharge Note   Patient Details  Name: Stephanie Charles MRN: [id number] Date of Birth: [birth year]  [facility] CM/SW Contact:  Estephania Licciardi C Brentley Landfair, RN Phone Number: 04/12/2024, 1:09 PM   Clinical Narrative:    Patient is alert and oriented x4. Discussed therapy recommendations for [facility]. Patient refused. She was advised to follow up with her PCP should she change her mind.   TOC signing off.           Patient Goals and CMS Choice            Discharge Placement                       Discharge Plan and Services Additional resources added to the After Visit Summary for                                       Social Drivers of Health (SDOH) Interventions SDOH Screenings   Food Insecurity: Unknown (04/10/2024)  Housing: Unknown (04/10/2024)  Transportation Needs: No Transportation Needs (04/10/2024)  Utilities: Not At Risk (04/09/2024)  Alcohol Screen: Low Risk  (06/03/2022)  Depression (PHQ2-9): Low Risk  (06/03/2022)  Financial Resource Strain: Medium Risk (04/10/2024)  Physical Activity: Inactive (08/18/2020)  Social Connections: Unknown (04/09/2024)  Stress: No Stress Concern Present (08/18/2020)  Tobacco Use: Low Risk  (02/17/2024)     Readmission Risk Interventions     No data to display

## 2024-04-12 NOTE — Discharge Summary (Signed)
 Physician Discharge Summary   Patient: Stephanie Charles MRN: [id number] DOB: [birth year]  Admit date:     04/08/2024  Discharge date: 04/12/24  Discharge Physician: Murvin Mana   PCP: Bertrum Charlie CROME, MD   Recommendations at discharge:   Follow-up with PCP in 1 week. Follow-up with cardiology in 2 weeks. Check a CBC and BMP at the next office visit. Hold torsemide  until BMP is checked  Discharge Diagnoses: Principal Problem:   Acute hypoxic respiratory failure (HCC) Active Problems:   HFrEF (heart failure with reduced ejection fraction) (HCC)   Chronic kidney disease, stage 4 (severe) (HCC)   Essential hypertension   Adult hypothyroidism   HLD (hyperlipidemia)   Acute on chronic combined systolic and diastolic CHF (congestive heart failure) (HCC)   Acute renal failure superimposed on stage 4 chronic kidney disease (HCC)   Atypical chest pain   Pulmonary edema cardiac cause (HCC)   Conjunctivitis   Sepsis (HCC)   Flash pulmonary edema (HCC)   Malignant hypertension Mild thrombocytopenia Resolved Problems:   * No resolved hospital problems. *  Hospital Course: Stephanie Charles is a 89 y.o. female with medical history significant for LBBB, hypertension, CKD II, hyperlipidemia, hypothyroidism, anxiety, depression, CAD, Hx MI and Stent, CHF, Diverticulitis, GERD, TIA, HOH left ear (hearing aid) history of fall in March 2025 and has been having left-sided rib pain since then..  She presented to the hospital with chest pain and shortness of breath.  Chest x-ray showed acute pulmonary edema.  BNP 359, troponin 468.  She is diagnosed with acute congestive heart failure, started on IV Lasix  daily, but since has been on hold due to worsening renal function. Renal function finally improving. She is stable for discharge  Assessment and Plan:  Acute on chronic combined systolic and diastolic CHF: Acute hypoxemic respiratory failure secondary to exacerbation congestive heart  failure Repeat troponin secondary to demand ischemia. 2D echo showed EF estimated at 45 to 50%, moderate concentric LVH, grade 1 diastolic dysfunction She was on carvedilol , Entresto , isosorbide  mononitrate and hydralazine . Volume status has been better, she is off oxygen .  But renal function progressively getting worse, Lasix  has been on hold.  Entresto  also discontinued due to worsening renal function. Renal function finally improved, medically stable for discharge.  Will continue hold off Entresto  and diuretics, added losartan .        AKI on CKD stage IV:  Hypokalemia Function gradually getting worse, Lasix  has been on hold.  Deceived 25 g albumin  yesterday, renal function finally improving.      Sepsis has been ruled out.  No evidence of infection thus far.  Repeat chest x-ray did not show any pneumonia.    Essential hypertension.   On home medicines.       Consultants: Cardiology Procedures performed: None  Disposition: Home health Diet recommendation:  Discharge Diet Orders (From admission, onward)     Start     Ordered   04/12/24 0000  Diet - low sodium heart healthy        04/12/24 1236           Cardiac diet DISCHARGE MEDICATION: Allergies as of 04/12/2024       Reactions   Codeine Nausea And Vomiting, Nausea Only   Levofloxacin Other (See Comments)   Mouth sores   Lisinopril Other (See Comments), Cough   Cough?   Other Itching   Povidone-iodine Other (See Comments), Rash, Dermatitis   Severe blistering and itchiness. Severe redness   Simvastatin Other (  See Comments)   Myalgias   Latex Itching        Medication List     STOP taking these medications    multivitamin capsule   sacubitril -valsartan  24-26 MG Commonly known as: ENTRESTO    torsemide  20 MG tablet Commonly known as: DEMADEX        TAKE these medications    albuterol  (2.5 MG/3ML) 0.083% nebulizer solution Commonly known as: PROVENTIL  USE 1 VIAL IN NEBULIZER EVERY 4 HOURS AS  NEEDED   allopurinol  300 MG tablet Commonly known as: ZYLOPRIM  Take 300 mg by mouth daily.   amitriptyline  10 MG tablet Commonly known as: ELAVIL  Take 1 tablet (10 mg total) by mouth at bedtime. What changed: how much to take   Aspirin  Low Dose 81 MG tablet Generic drug: aspirin  EC NEW PRESCRIPTION REQUEST: TAKE ONE TABLET BY MOUTH EVERY DAY   atorvastatin  40 MG tablet Commonly known as: LIPITOR Take 1 tablet by mouth once daily   carvedilol  3.125 MG tablet Commonly known as: COREG  Take 1 tablet (3.125 mg total) by mouth 2 (two) times daily with a meal.   clopidogrel  75 MG tablet Commonly known as: PLAVIX  Take 1 tablet by mouth once daily   cyclobenzaprine  5 MG tablet Commonly known as: FLEXERIL  Take 5 mg by mouth 3 (three) times daily as needed.   ferrous sulfate  325 (65 FE) MG tablet Take 325 mg by mouth at bedtime.   fluorometholone  0.1 % ophthalmic suspension Commonly known as: FML Place 1 drop into both eyes 2 (two) times daily.   gabapentin  100 MG capsule Commonly known as: NEURONTIN  Take 1 capsule by mouth at bedtime.   hydrALAZINE  100 MG tablet Commonly known as: APRESOLINE  Take 1 tablet (100 mg total) by mouth 3 (three) times daily. TAKE 1 TABLET BY MOUTH THREE TIMES DAILY FOR HIGH BLOOD PRESSURE   isosorbide  mononitrate 60 MG 24 hr tablet Commonly known as: IMDUR  Take 1 tablet (60 mg total) by mouth 2 (two) times daily.   levothyroxine  137 MCG tablet Commonly known as: SYNTHROID  Take 1 tablet (137 mcg total) by mouth daily before breakfast.   losartan  25 MG tablet Commonly known as: COZAAR  Take 1 tablet (25 mg total) by mouth daily. Start taking on: April 13, 2024   Melatonin 10 MG Tabs Take 10 tablets by mouth at bedtime.   mirtazapine  30 MG tablet Commonly known as: REMERON  TAKE 1 TABLET BY MOUTH AT BEDTIME   nitroGLYCERIN  0.4 MG SL tablet Commonly known as: NITROSTAT  Place 1 tablet (0.4 mg total) under the tongue every 5 (five) minutes  as needed for chest pain.   pantoprazole  40 MG tablet Commonly known as: PROTONIX  Take 1 tablet (40 mg total) by mouth daily.   potassium chloride  10 MEQ tablet Commonly known as: KLOR-CON  Take 1 tablet (10 mEq total) by mouth daily as needed (when you take torsemide ).   sertraline  25 MG tablet Commonly known as: ZOLOFT  Take 1 tablet by mouth once daily        Follow-up Information     [facility]. Go on 04/17/2024.   Specialty: Cardiology Why: Hospital Follow-Up 04/17/24 @ 3:00 PM Please bring all medications to follow-up appointment [facility] [address] [employer] [place] Parking at the door Contact information: [address] [place]  [address] [phone]        Bertrum Charlie CROME, MD Follow up in 1 week(s).   Specialty: Family Medicine Contact information: [address] [place] [address] [phone]         Gollan, Timothy J, MD Follow up in 2 week(s).   Specialty: Cardiology Contact information: [address] [place] [address] [phone]                Discharge Exam: Filed Weights   04/10/24 0500 04/11/24 0500 04/12/24 0545  Weight: 67.8 kg 67.4 kg 67.2 kg   General exam: Appears calm and comfortable  Respiratory system: Clear to auscultation. Respiratory effort normal. Cardiovascular system: S1 & S2 heard, RRR. No JVD, murmurs, rubs, gallops or clicks. No pedal edema. Gastrointestinal system: Abdomen is nondistended, soft and nontender. No organomegaly or masses felt. Normal bowel sounds heard. Central nervous system: Alert and oriented. No focal neurological deficits. Extremities: Symmetric 5 x 5 power. Skin: No rashes, lesions or ulcers Psychiatry: Judgement and insight appear normal. Mood & affect appropriate.    Condition at discharge: good  The results of significant diagnostics from this hospitalization  (including imaging, microbiology, ancillary and laboratory) are listed below for reference.   Imaging Studies: DG Chest 2 View Result Date: 04/09/2024 CLINICAL DATA:  Follow-up edema or pneumonia EXAM: CHEST - 2 VIEW COMPARISON:  04/08/2024 01/06/2024 FINDINGS: Cardiomegaly with aortic atherosclerosis. Small bilateral effusions. Decreased edema compared to prior. No focal airspace disease. IMPRESSION: Cardiomegaly with small effusions. Decreased edema compared to prior. Electronically Signed   By: Luke Bun M.D.   On: 04/09/2024 20:22   ECHOCARDIOGRAM COMPLETE Result Date: 04/09/2024    ECHOCARDIOGRAM REPORT   Patient Name:   Stephanie Charles Date of Exam: 04/09/2024 Medical Rec #:  [id number]      Height:       61.0 in Accession #:    [id number]     Weight:       153.7 lb Date of Birth:  1929-10-25     BSA:          1.689 m Patient Age:    89 years       BP:           118/48 mmHg Patient Gender: F              HR:           77 bpm. Exam Location:  [facility] Procedure: 2D Echo, Cardiac Doppler, Color Doppler and Intracardiac            Opacification Agent (Both Spectral and Color Flow Doppler were            utilized during procedure). Indications:     Chest Pain R07.9  History:         Patient has prior history of Echocardiogram examinations, most                  recent 07/29/2023. Signs/Symptoms:Chest Pain.  Sonographer:     Ashley McNeely-Sloane Referring Phys:  [id number] AVA SWAYZE Diagnosing Phys: Cara JONETTA Lovelace MD IMPRESSIONS  1. Left ventricular ejection fraction, by estimation, is 45 to 50%. The left ventricle has mildly decreased function. The left ventricle demonstrates global hypokinesis. There is moderate concentric left ventricular hypertrophy. Left ventricular diastolic parameters are consistent with Grade I diastolic dysfunction (impaired relaxation).  2. Right ventricular systolic function is normal. The right ventricular size is normal.  3. Left atrial size was mildly dilated.  4. Right atrial size  was mildly dilated.  5. The mitral valve is grossly normal. Trivial mitral valve regurgitation.  6. The aortic valve  is normal in structure. Aortic valve regurgitation is not visualized. FINDINGS  Left Ventricle: Left ventricular ejection fraction, by estimation, is 45 to 50%. The left ventricle has mildly decreased function. The left ventricle demonstrates global hypokinesis. Definity  contrast agent was given IV to delineate the left ventricular  endocardial borders. Strain was performed and the global longitudinal strain is indeterminate. Global longitudinal strain performed but not reported based on interpreter judgement due to suboptimal tracking. The left ventricular internal cavity size was  normal in size. There is moderate concentric left ventricular hypertrophy. Left ventricular diastolic parameters are consistent with Grade I diastolic dysfunction (impaired relaxation). Right Ventricle: The right ventricular size is normal. No increase in right ventricular wall thickness. Right ventricular systolic function is normal. Left Atrium: Left atrial size was mildly dilated. Right Atrium: Right atrial size was mildly dilated. Pericardium: There is no evidence of pericardial effusion. Mitral Valve: The mitral valve is grossly normal. Trivial mitral valve regurgitation. MV peak gradient, 4.6 mmHg. The mean mitral valve gradient is 2.0 mmHg. Tricuspid Valve: The tricuspid valve is normal in structure. Tricuspid valve regurgitation is mild. Aortic Valve: The aortic valve is normal in structure. Aortic valve regurgitation is not visualized. Aortic valve mean gradient measures 5.0 mmHg. Aortic valve peak gradient measures 8.9 mmHg. Aortic valve area, by VTI measures 1.64 cm. Pulmonic Valve: The pulmonic valve was normal in structure. Pulmonic valve regurgitation is trivial. Aorta: The ascending aorta was not well visualized. IAS/Shunts: No atrial level shunt detected by color flow Doppler. Additional Comments: 3D was  performed not requiring image post processing on an independent workstation and was indeterminate.  LEFT VENTRICLE PLAX 2D LVIDd:         3.60 cm     Diastology LVIDs:         1.77 cm     LV e' medial:    3.00 cm/s LV PW:         1.94 cm     LV E/e' medial:  20.7 LV IVS:        1.41 cm     LV e' lateral:   6.55 cm/s LVOT diam:     1.90 cm     LV E/e' lateral: 9.5 LV SV:         59 LV SV Index:   35 LVOT Area:     2.84 cm  LV Volumes (MOD) LV vol d, MOD A2C: 64.1 ml LV vol d, MOD A4C: 78.2 ml LV vol s, MOD A2C: 31.5 ml LV vol s, MOD A4C: 37.5 ml LV SV MOD A2C:     32.6 ml LV SV MOD A4C:     78.2 ml LV SV MOD BP:      38.2 ml RIGHT VENTRICLE RV S prime:     8.80 cm/s TAPSE (M-mode): 1.8 cm LEFT ATRIUM              Index        RIGHT ATRIUM           Index LA diam:        4.50 cm  2.66 cm/m   RA Area:     11.80 cm LA Vol (A2C):   110.0 ml 65.14 ml/m  RA Volume:   27.70 ml  16.40 ml/m LA Vol (A4C):   133.0 ml 78.76 ml/m LA Biplane Vol: 128.0 ml 75.80 ml/m  AORTIC VALVE  PULMONIC VALVE AV Area (Vmax):    1.67 cm      PV Vmax:          1.29 m/s AV Area (Vmean):   1.70 cm      PV Vmean:         86.300 cm/s AV Area (VTI):     1.64 cm      PV VTI:           0.235 m AV Vmax:           149.00 cm/s   PV Peak grad:     6.7 mmHg AV Vmean:          110.000 cm/s  PV Mean grad:     3.0 mmHg AV VTI:            0.362 m       PR End Diast Vel: 3.59 msec AV Peak Grad:      8.9 mmHg      RVOT Peak grad:   4 mmHg AV Mean Grad:      5.0 mmHg LVOT Vmax:         87.90 cm/s LVOT Vmean:        66.000 cm/s LVOT VTI:          0.209 m LVOT/AV VTI ratio: 0.58  AORTA Ao Root diam: 2.50 cm Ao Asc diam:  1.95 cm MITRAL VALVE MV Area (PHT): 3.45 cm    SHUNTS MV Area VTI:   3.26 cm    Systemic VTI:  0.21 m MV Peak grad:  4.6 mmHg    Systemic Diam: 1.90 cm MV Mean grad:  2.0 mmHg    Pulmonic VTI:  0.149 m MV Vmax:       1.07 m/s MV Vmean:      69.3 cm/s MV Decel Time: 220 msec MV E velocity: 62.10 cm/s MV A velocity: 87.80  cm/s MV E/A ratio:  0.71 Dwayne D Callwood MD Electronically signed by Cara JONETTA Lovelace MD Signature Date/Time: 04/09/2024/4:54:44 PM    Final    DG Chest Port 1 View Result Date: 04/08/2024 CLINICAL DATA:  89 year old female with possible sepsis. EXAM: PORTABLE CHEST 1 VIEW COMPARISON:  Chest radiographs 01/06/2024 and earlier. FINDINGS: Portable AP upright view at 0551 hours. Improved lung volumes. Stable cardiomegaly and mediastinal contours. Calcified aortic atherosclerosis. Visualized tracheal air column is within normal limits. Chronic but increased bilateral pulmonary interstitial opacity, vascularity appears indistinct. And trace new fluid in the right minor fissure. No pneumothorax. No consolidation or layering pleural effusion identified. Paucity of bowel gas.  Stable visualized osseous structures. IMPRESSION: Evidence of Acute Pulmonary Edema, trace pleural fluid now, superimposed on chronic cardiomegaly and calcified aortic atherosclerosis. Electronically Signed   By: VEAR Hurst M.D.   On: 04/08/2024 05:59    Microbiology: Results for orders placed or performed during the hospital encounter of 04/08/24  Blood Culture (routine x 2)     Status: None (Preliminary result)   Collection Time: 04/08/24  5:36 AM   Specimen: BLOOD  Result Value Ref Range Status   Specimen Description BLOOD BLOOD RIGHT ARM  Final   Special Requests   Final    BOTTLES DRAWN AEROBIC AND ANAEROBIC Blood Culture adequate volume   Culture   Final    NO GROWTH 3 DAYS Performed at [facility], [address], [place], [place] [address]    Report Status PENDING  Incomplete  Blood Culture (routine x 2)     Status:  None (Preliminary result)   Collection Time: 04/08/24  5:36 AM   Specimen: BLOOD  Result Value Ref Range Status   Specimen Description BLOOD BLOOD LEFT ARM  Final   Special Requests   Final    BOTTLES DRAWN AEROBIC AND ANAEROBIC Blood Culture adequate volume   Culture   Final    NO GROWTH 3  DAYS Performed at [facility], [address], [place], [place] [address]    Report Status PENDING  Incomplete  Resp panel by RT-PCR (RSV, Flu A&B, Covid) Anterior Nasal Swab     Status: None   Collection Time: 04/08/24  5:50 AM   Specimen: Anterior Nasal Swab  Result Value Ref Range Status   SARS Coronavirus 2 by RT PCR NEGATIVE NEGATIVE Final    Comment: (NOTE) SARS-CoV-2 target nucleic acids are NOT DETECTED.  The SARS-CoV-2 RNA is generally detectable in upper respiratory specimens during the acute phase of infection. The lowest concentration of SARS-CoV-2 viral copies this assay can detect is 138 copies/mL. A negative result does not preclude SARS-Cov-2 infection and should not be used as the sole basis for treatment or other patient management decisions. A negative result may occur with  improper specimen collection/handling, submission of specimen other than nasopharyngeal swab, presence of viral mutation(s) within the areas targeted by this assay, and inadequate number of viral copies(<138 copies/mL). A negative result must be combined with clinical observations, patient history, and epidemiological information. The expected result is Negative.  Fact Sheet for Patients:  BloggerCourse.com  Fact Sheet for Healthcare Providers:  SeriousBroker.it  This test is no t yet approved or cleared by the [place]  FDA and  has been authorized for detection and/or diagnosis of SARS-CoV-2 by FDA under an Emergency Use Authorization (EUA). This EUA will remain  in effect (meaning this test can be used) for the duration of the COVID-19 declaration under Section 564(b)(1) of the Act, 21 U.S.C.section 360bbb-3(b)(1), unless the authorization is terminated  or revoked sooner.       Influenza A by PCR NEGATIVE NEGATIVE Final   Influenza B by PCR NEGATIVE NEGATIVE Final    Comment: (NOTE) The Xpert Xpress  SARS-CoV-2/FLU/RSV plus assay is intended as an aid in the diagnosis of influenza from Nasopharyngeal swab specimens and should not be used as a sole basis for treatment. Nasal washings and aspirates are unacceptable for Xpert Xpress SARS-CoV-2/FLU/RSV testing.  Fact Sheet for Patients: BloggerCourse.com  Fact Sheet for Healthcare Providers: SeriousBroker.it  This test is not yet approved or cleared by the [place]  FDA and has been authorized for detection and/or diagnosis of SARS-CoV-2 by FDA under an Emergency Use Authorization (EUA). This EUA will remain in effect (meaning this test can be used) for the duration of the COVID-19 declaration under Section 564(b)(1) of the Act, 21 U.S.C. section 360bbb-3(b)(1), unless the authorization is terminated or revoked.     Resp Syncytial Virus by PCR NEGATIVE NEGATIVE Final    Comment: (NOTE) Fact Sheet for Patients: BloggerCourse.com  Fact Sheet for Healthcare Providers: SeriousBroker.it  This test is not yet approved or cleared by the [place]  FDA and has been authorized for detection and/or diagnosis of SARS-CoV-2 by FDA under an Emergency Use Authorization (EUA). This EUA will remain in effect (meaning this test can be used) for the duration of the COVID-19 declaration under Section 564(b)(1) of the Act, 21 U.S.C. section 360bbb-3(b)(1), unless the authorization is terminated or revoked.  Performed at [facility], [address] [place],  [place] [address]   MRSA Next Gen by PCR, Nasal     Status: None   Collection Time: 04/12/24  5:45 AM   Specimen: Nasal Mucosa; Nasal Swab  Result Value Ref Range Status   MRSA by PCR Next Gen NOT DETECTED NOT DETECTED Final    Comment: (NOTE) The GeneXpert MRSA Assay (FDA approved for NASAL specimens only), is one component of a comprehensive MRSA colonization  surveillance program. It is not intended to diagnose MRSA infection nor to guide or monitor treatment for MRSA infections. Test performance is not FDA approved in patients less than 14 years old. Performed at [facility], [address] [place], [place] [address]     Labs: CBC: Recent Labs  Lab 04/08/24 [phone] 04/09/24 0550 04/10/24 0953 04/11/24 0534  WBC 11.9* 7.2 5.3 5.2  NEUTROABS 11.0*  --  4.2  --   HGB 12.0 9.6* 9.5* 9.4*  HCT 36.2 28.7* 28.9* 28.4*  MCV 95.3 94.7 95.1 93.7  PLT 143* 101* 97* 113*   Basic Metabolic Panel: Recent Labs  Lab 04/08/24 0536 04/09/24 0550 04/10/24 0953 04/11/24 0534 04/12/24 0619  NA 137 137 139 139 138  K 3.5 3.4* 4.1 4.4 3.9  CL 102 104 105 105 105  CO2 23 22 23 23 23   GLUCOSE 137* 113* 118* 127* 90  BUN 53* 56* 61* 67* 76*  CREATININE 1.92* 2.47* 2.61* 2.73* 2.68*  CALCIUM  9.3 8.7* 8.9 9.0 9.0  MG  --   --  1.8  --  2.0  PHOS  --   --   --  4.0  --    Liver Function Tests: Recent Labs  Lab 04/08/24 0536 04/09/24 0550 04/11/24 0534  AST 33 35  --   ALT 26 36  --   ALKPHOS 104 70  --   BILITOT 0.6 0.9  --   PROT 7.3 5.5*  --   ALBUMIN  4.2 3.1* 3.2*   CBG: No results for input(s): GLUCAP in the last 168 hours.  Discharge time spent: greater than 30 minutes.  Signed: Murvin Mana, MD [facility] 04/12/2024

## 2024-04-12 NOTE — Evaluation (Signed)
 Occupational Therapy Evaluation Patient Details Name: Stephanie Charles MRN: [id number] DOB: Jul 15, 1930 Today's Date: 04/12/2024   History of Present Illness   Pt is a 89 y.o. 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 difficile, CKD stage III, anemia, gout, anxiety/depression, who is being seen 04/08/2024 for the evaluation of elevated high-sensitivity troponins, hypertensive emergency, and shortness of breath.     Clinical Impressions Patient presenting with decreased Ind in self care,balance, functional mobility/transfers, endurance, and safety awareness. Patient reports being Mod I level at home and living with daughter and granddaughter.  Daughter is at home 24/7 to assist pt as needed. Pt demonstrates ambulation in room with min guard and lateral scoots along bed to get further towards [facility] with supervision. Grooming with set up A to obtain items and pt returning to bed at end of session with supervision. Patient will benefit from acute OT to increase overall independence in the areas of ADLs, functional mobility, and safety awareness in order to safely discharge.     If plan is discharge home, recommend the following:   A little help with walking and/or transfers;A little help with bathing/dressing/bathroom;Assist for transportation;Help with stairs or ramp for entrance     Functional Status Assessment   Patient has had a recent decline in their functional status and demonstrates the ability to make significant improvements in function in a reasonable and predictable amount of time.     Equipment Recommendations   None recommended by OT      Precautions/Restrictions   Precautions Precautions:  Fall Recall of Precautions/Restrictions: Intact     Mobility Bed Mobility Overal bed mobility: Modified Independent                  Transfers Overall transfer level: Needs assistance Equipment used: Rolling walker (2 wheels), None Transfers: Sit to/from Stand, Bed to chair/wheelchair/BSC Sit to Stand: Contact guard assist, Supervision     Step pivot transfers: Contact guard assist            Balance Overall balance assessment: Needs assistance Sitting-balance support: Feet supported Sitting balance-Leahy Scale: Good     Standing balance support: Bilateral upper extremity supported, During functional activity Standing balance-Leahy Scale: Fair                             ADL either performed or assessed with clinical judgement   ADL Overall ADL's : Needs assistance/impaired     Grooming: Wash/dry hands;Wash/dry face;Supervision/safety                   Toilet Transfer: Contact guard assist                   Vision Patient Visual Report: No change from baseline              Pertinent Vitals/Pain Pain Assessment Pain Assessment: No/denies pain     Extremity/Trunk Assessment Upper Extremity Assessment Upper Extremity Assessment: Generalized weakness   Lower Extremity Assessment Lower Extremity Assessment: Generalized weakness       Communication Communication Communication: No apparent difficulties   Cognition Arousal: Alert Behavior During Therapy: WFL for tasks assessed/performed Cognition: No apparent impairments  Following commands: Intact       Cueing  General Comments   Cueing Techniques: Verbal cues              Home Living Family/patient expects to be discharged to:: Private residence Living Arrangements: Children;Other relatives (lives with daughter and granddaughter) Available Help at Discharge: Family Type of Home: House Home Access: Stairs to  enter [employer] of Steps: single threshold step Entrance Stairs-Rails: None Home Layout: Able to live on main level with bedroom/bathroom;Two level     Bathroom Shower/Tub: Runner, broadcasting/film/video: Rollator (4 wheels);Cane - single point;Hand held shower head;Shower seat          Prior Functioning/Environment               Mobility Comments: Ambulatory without AD due pt strengthening herself enough to not need her walker ADLs Comments: Pt reports ADLs at mod I level and reports doing laundry and home with family assisting with other IADLs and transportation to appointments.    OT Problem List: Decreased strength;Impaired balance (sitting and/or standing);Decreased safety awareness;Decreased activity tolerance   OT Treatment/Interventions: Self-care/ADL training;Therapeutic exercise;Patient/family education;Neuromuscular education;Balance training;Energy conservation;Therapeutic activities;DME and/or AE instruction      OT Goals(Current goals can be found in the care plan section)   Acute Rehab OT Goals Patient Stated Goal: to go home OT Goal Formulation: With patient Time For Goal Achievement: 04/26/24 Potential to Achieve Goals: Fair ADL Goals Pt Will Perform Grooming: with modified independence;standing Pt Will Perform Lower Body Dressing: with supervision;sit to/from stand Pt Will Transfer to Toilet: with supervision;ambulating Pt Will Perform Toileting - Clothing Manipulation and hygiene: with supervision;sit to/from stand   OT Frequency:  Min 2X/week       AM-PAC OT 6 Clicks Daily Activity     Outcome Measure Help from another person eating meals?: None Help from another person taking care of personal grooming?: None Help from another person toileting, which includes using toliet, bedpan, or urinal?: A Little Help from another person bathing (including washing, rinsing, drying)?: A Little Help from another person to put on  and taking off regular upper body clothing?: A Little Help from another person to put on and taking off regular lower body clothing?: A Little 6 Click Score: 20   End of Session    Activity Tolerance: Patient tolerated treatment well;Patient limited by fatigue Patient left: in bed;with call bell/phone within reach;with bed alarm set  OT Visit Diagnosis: Unsteadiness on feet (R26.81);Muscle weakness (generalized) (M62.81)                Time: 1140-1200 OT Time Calculation (min): 20 min Charges:  OT General Charges $OT Visit: 1 Visit OT Evaluation $OT Eval Low Complexity: 1 Low OT Treatments $Self Care/Home Management : 8-22 mins  Izetta Claude, MS, OTR/L , CBIS ascom [phone]  04/12/24, 12:10 PM

## 2024-04-13 LAB — CULTURE, BLOOD (ROUTINE X 2)
Culture: NO GROWTH
Culture: NO GROWTH
Special Requests: ADEQUATE
Special Requests: ADEQUATE

## 2024-04-16 ENCOUNTER — Telehealth: Payer: Self-pay | Admitting: Family

## 2024-04-16 NOTE — Telephone Encounter (Signed)
 Called to confirm/remind patient of their appointment at the [facility] on 04/17/24.   Appointment:   [] Confirmed  [x] Left mess   [] No answer/No voice mail  [] VM Full/unable to leave message  [] Phone not in service  Patient reminded to bring all medications and/or complete list.  Confirmed patient has transportation. Gave directions, instructed to utilize valet parking.

## 2024-04-16 NOTE — Progress Notes (Deleted)
 [facility] Note   Referring Physician: admission PCP: Bertrum Charlie CROME, MD Cardiologist: Evalene Lunger, MD   Chief Complaint:    HPI:  [name] is a 89 y/o female with a history of LBBB, TIA/CVA in 2016, CKD stage II, HTN, HLD, anemia, carotid artery disease, NSTEMI 06/2019 (PCI/DES to the LAD), NSTEMI 2016, hypothyroidism, PAD, anxiety, depression and chronic heart failure.    Previous cardiac studies: Echo 09/18/2019: EF 40 to 45% Echo 03/07/21: EF 50-55%, normal RV, moderate LVH Echo 10/07/21: EF 40% Echo 07/29/23: EF 55 to 60%  Admitted to [facility] in 06/2019 with NSTEMI and HTN. Echo 06/06/2019 showed LVEF 35-40%. Frequenct PVCs noted. R/L heart cath 06/07/2019 showed mid LAD 95% stenosis s/p successful PCI/DES, D2 30% stenosis. RHC showed mean RA pressure of 8, RV pressure 32/7 with a mean of 9, PA pressure 31/16 with a mean of 21, PCWP 10, CO/CI 3.57/2.02.   Admitted 07/2023 with chest pain that woke her up from sleep with elevated blood pressure.  She was treated with IV heparin  x 48 hours.  Echo showed EF of 55 to 60%.  She was restarted on Entresto .  She was felt to be a poor cardiac cath candidate. VQ scan showed no PE.   Went to the ER 01/06/24 for HTN and nausea. At home BP was 213/102. BP in the ER 172/80. HS trop 69>67.Labs were stable. CXR and CT head were reassuring. She was discharged home.   Admitted 04/08/24 with chest pain and shortness of breath. Chest x-ray showed acute pulmonary edema. BNP 359, troponin 468. She is diagnosed with acute congestive heart failure, started on IV Lasix  daily, but this had to be held due to worsening renal function. Echo 04/09/24: EF 45-50% with moderate LVH, G1DD, normal RV. Weaned off oxygen . Entresto  / diuretics held at discharge due to renal function. Losartan  was added.   She presents today for her initial HF visit with a chief complaint of    Review of Systems: [y] = yes, [ ]  = no   General: Weight gain [ ] ; Weight loss  [ ] ; Anorexia [ ] ; Fatigue [ ] ; Fever [ ] ; Chills [ ] ; Weakness [ ]   Cardiac: Chest pain/pressure [ ] ; Resting SOB [ ] ; Exertional SOB [ ] ; Orthopnea [ ] ; Pedal Edema [ ] ; Palpitations [ ] ; Syncope [ ] ; Presyncope [ ] ; Paroxysmal nocturnal dyspnea[ ]   Pulmonary: Cough [ ] ; Wheezing[ ] ; Hemoptysis[ ] ; Sputum [ ] ; Snoring [ ]   GI: Vomiting[ ] ; Dysphagia[ ] ; Melena[ ] ; Hematochezia [ ] ; Heartburn[ ] ; Abdominal pain [ ] ; Constipation [ ] ; Diarrhea [ ] ; BRBPR [ ]   GU: Hematuria[ ] ; Dysuria [ ] ; Nocturia[ ]   Vascular: Pain in legs with walking [ ] ; Pain in feet with lying flat [ ] ; Non-healing sores [ ] ; Stroke [ ] ; TIA [ ] ; Slurred speech [ ] ;  Neuro: Headaches[ ] ; Vertigo[ ] ; Seizures[ ] ; Paresthesias[ ] ;Blurred vision [ ] ; Diplopia [ ] ; Vision changes [ ]   Ortho/Skin: Arthritis [ ] ; Joint pain [ ] ; Muscle pain [ ] ; Joint swelling [ ] ; Back Pain [ ] ; Rash [ ]   Psych: Depression[ ] ; Anxiety[ ]   Heme: Bleeding problems [ ] ; Clotting disorders [ ] ; Anemia [ ]   Endocrine: Diabetes [ ] ; Thyroid  dysfunction[ ]    Past Medical History:  Diagnosis Date   Anemia    C. difficile diarrhea    CHF (congestive heart failure) (HCC) 10/05/2021   Diverticulitis    GERD (gastroesophageal reflux disease)  Hyperlipidemia    Hypertension    Hypothyroidism    Myocardial infarction (HCC) 08/2017   TIA (transient ischemic attack)    1 approx 2015, 1 approx 2018   Vertigo    last episode several months ago   [name]     Current Outpatient Medications  Medication Sig Dispense Refill   albuterol  (PROVENTIL ) (2.5 MG/3ML) 0.083% nebulizer solution USE 1 VIAL IN NEBULIZER EVERY 4 HOURS AS NEEDED 180 mL 6   allopurinol  (ZYLOPRIM ) 300 MG tablet Take 300 mg by mouth daily.     amitriptyline  (ELAVIL ) 10 MG tablet Take 1 tablet (10 mg total) by mouth at bedtime. (Patient taking differently: Take 20 mg by mouth at bedtime.) 90 tablet 3   ASPIRIN  LOW DOSE 81 MG EC tablet NEW PRESCRIPTION REQUEST:  TAKE ONE TABLET BY MOUTH EVERY DAY 90 tablet 3   atorvastatin  (LIPITOR) 40 MG tablet Take 1 tablet by mouth once daily 90 tablet 3   carvedilol  (COREG ) 3.125 MG tablet Take 1 tablet (3.125 mg total) by mouth 2 (two) times daily with a meal. 180 tablet 3   clopidogrel  (PLAVIX ) 75 MG tablet Take 1 tablet by mouth once daily 90 tablet 3   cyclobenzaprine  (FLEXERIL ) 5 MG tablet Take 5 mg by mouth 3 (three) times daily as needed.     ferrous sulfate  325 (65 FE) MG tablet Take 325 mg by mouth at bedtime.     fluorometholone  (FML) 0.1 % ophthalmic suspension Place 1 drop into both eyes 2 (two) times daily.     gabapentin  (NEURONTIN ) 100 MG capsule Take 1 capsule by mouth at bedtime.     hydrALAZINE  (APRESOLINE ) 100 MG tablet Take 1 tablet (100 mg total) by mouth 3 (three) times daily. TAKE 1 TABLET BY MOUTH THREE TIMES DAILY FOR HIGH BLOOD PRESSURE 270 tablet 1   isosorbide  mononitrate (IMDUR ) 60 MG 24 hr tablet Take 1 tablet (60 mg total) by mouth 2 (two) times daily. 180 tablet 3   levothyroxine  (SYNTHROID ) 137 MCG tablet Take 1 tablet (137 mcg total) by mouth daily before breakfast. 90 tablet 3   losartan  (COZAAR ) 25 MG tablet Take 1 tablet (25 mg total) by mouth daily. 30 tablet 0   Melatonin 10 MG TABS Take 10 tablets by mouth at bedtime.     mirtazapine  (REMERON ) 30 MG tablet TAKE 1 TABLET BY MOUTH AT BEDTIME 90 tablet 3   nitroGLYCERIN  (NITROSTAT ) 0.4 MG SL tablet Place 1 tablet (0.4 mg total) under the tongue every 5 (five) minutes as needed for chest pain. 25 tablet 3   pantoprazole  (PROTONIX ) 40 MG tablet Take 1 tablet (40 mg total) by mouth daily. 90 tablet 3   potassium chloride  (KLOR-CON ) 10 MEQ tablet Take 1 tablet (10 mEq total) by mouth daily as needed (when you take torsemide ). 90 tablet 3   sertraline  (ZOLOFT ) 25 MG tablet Take 1 tablet by mouth once daily 90 tablet 3   No current facility-administered medications for this visit.    Allergies  Allergen Reactions   Codeine Nausea  And Vomiting and Nausea Only   Levofloxacin Other (See Comments)    Mouth sores   Lisinopril Other (See Comments) and Cough    Cough?   Other Itching   Povidone-Iodine Other (See Comments), Rash and Dermatitis    Severe blistering and itchiness. Severe redness   Simvastatin Other (See Comments)    Myalgias   Latex Itching      Social History  Socioeconomic History   Marital status: Divorced    Spouse name: na   Number of children: 4   Years of education: 14   Highest education level: Associate degree: occupational, Scientist, product/process development, or vocational program  Occupational History   Occupation: retired  Tobacco Use   Smoking status: Never   Smokeless tobacco: Never  Vaping Use   Vaping status: Never Used  Substance and Sexual Activity   Alcohol use: No    Alcohol/week: 0.0 standard drinks of alcohol   Drug use: No   Sexual activity: Never  Other Topics Concern   Not on file  Social History Narrative   Not on file   Social Drivers of Health   Financial Resource Strain: Medium Risk (04/10/2024)   Overall Financial Resource Strain (CARDIA)    Difficulty of Paying Living Expenses: Somewhat hard  Food Insecurity: Unknown (04/10/2024)   Hunger Vital Sign    Worried About Running Out of Food in the Last Year: Never true    Ran Out of Food in the Last Year: Not on file  Transportation Needs: No Transportation Needs (04/10/2024)   PRAPARE - Administrator, Civil Service (Medical): No    Lack of Transportation (Non-Medical): No  Physical Activity: Inactive (08/18/2020)   Exercise Vital Sign    Days of Exercise per Week: 0 days    Minutes of Exercise per Session: 0 min  Stress: No Stress Concern Present (08/18/2020)   [employer] of Occupational Health - Occupational Stress Questionnaire    Feeling of Stress : Not at all  Social Connections: Unknown (04/09/2024)   Social Connection and Isolation Panel    Frequency of Communication with Friends and Family: Never     Frequency of Social Gatherings with Friends and Family: Never    Attends Religious Services: Never    Database administrator or Organizations: Patient declined    Attends Banker Meetings: Patient declined    Marital Status: Patient declined  Intimate Partner Violence: Not At Risk (04/09/2024)   Humiliation, Afraid, Rape, and Kick questionnaire    Fear of Current or Ex-Partner: No    Emotionally Abused: No    Physically Abused: No    Sexually Abused: No      Family History  Problem Relation Age of Onset   Heart disease Mother    Hypertension Mother    Stroke Mother    Heart disease Father    Breast cancer Sister    Alzheimer's disease Brother    Heart attack Brother    Stroke Brother    Heart disease Brother    Mental illness Brother        died suicide   Cancer Brother        prostate   Heart disease Brother        atrial fib   Alzheimer's disease Brother    Clotting disorder Daughter    Fibromyalgia Daughter        PHYSICAL EXAM: General:  Well appearing. No respiratory difficulty HEENT: normal Neck: supple. no JVD. Carotids 2+ bilat; no bruits. No lymphadenopathy or thyromegaly appreciated. Cor: PMI nondisplaced. Regular rate & rhythm. No rubs, gallops or murmurs. Lungs: clear Abdomen: soft, nontender, nondistended. No hepatosplenomegaly. No bruits or masses. Good bowel sounds. Extremities: no cyanosis, clubbing, rash, edema Neuro: alert & oriented x 3, cranial nerves grossly intact. moves all 4 extremities w/o difficulty. Affect pleasant.  ECG:   ASSESSMENT & PLAN:  1: ICM with mildly reduced ejection  fraction- - NYHA class - euvolemic - Echo 09/18/2019: EF 40 to 45% - Echo 03/07/21: EF 50-55%, normal RV, moderate LVH - Echo 10/07/21: EF 40% - Echo 07/29/23: EF 55 to 60% - Echo 04/09/24: EF 45-50% with moderate LVH, G1DD, normal RV.  - continue  - BNP 04/08/24 was 359.3  2: HTN- - BP - saw PCP Garnet) 05/25 - BMET 04/12/24 reviewed: sodium  138, potassium 3.9, creatinine 2.68 & GFR 16  3: CAD- - saw cardiology Florestine) 05/25 - NSTEMI 2016 - NSTEMI 06/2019 (PCI/DES to the LAD), - R/L heart cath 06/07/2019 showed mid LAD 95% stenosis s/p successful PCI/DES, D2 30% stenosis. RHC showed mean RA pressure of 8, RV pressure 32/7 with a mean of 9, PA pressure 31/16 with a mean of 21, PCWP 10, CO/CI 3.57/2.02.   4: Anemia- - Hg 04/11/24 was 9.4  5: Hyperlipidemia- - continue - LDL 04/09/24 was 31  6: History of LBBB- - EKG today   Ellouise DELENA Class, FNP 04/16/24

## 2024-04-17 ENCOUNTER — Encounter: Admitting: Family

## 2024-04-18 ENCOUNTER — Other Ambulatory Visit: Payer: Self-pay

## 2024-04-19 ENCOUNTER — Other Ambulatory Visit: Payer: Self-pay

## 2024-04-20 ENCOUNTER — Other Ambulatory Visit: Payer: Self-pay

## 2024-04-23 ENCOUNTER — Telehealth: Payer: Self-pay | Admitting: Cardiovascular Disease

## 2024-04-23 ENCOUNTER — Other Ambulatory Visit: Payer: Self-pay

## 2024-04-23 MED ORDER — LOSARTAN POTASSIUM 25 MG PO TABS: 25.0000 mg | ORAL_TABLET | Freq: Every day | ORAL | 2 refills | Status: DC

## 2024-04-23 NOTE — Telephone Encounter (Signed)
*  STAT* If patient is at the pharmacy, call can be transferred to refill team.   1. Which medications need to be refilled? (please list name of each medication and dose if known)    losartan  (COZAAR ) 25 MG tablet      4. Which pharmacy/location (including street and city if local pharmacy) is medication to be sent to?  [facility] [address]), [place] - [address] Phone: [phone]  Fax: [phone]       5. Do they need a 30 day or 90 day supply? 90

## 2024-04-23 NOTE — Telephone Encounter (Signed)
 RX sent to requested Pharmacy

## 2024-05-03 ENCOUNTER — Ambulatory Visit: Admitting: Medical

## 2024-05-07 ENCOUNTER — Emergency Department

## 2024-05-07 ENCOUNTER — Encounter: Payer: Self-pay | Admitting: Emergency Medicine

## 2024-05-07 ENCOUNTER — Inpatient Hospital Stay
Admission: EM | Admit: 2024-05-07 | Discharge: 2024-05-11 | DRG: 291 | Disposition: A | Discharge status: Home Health | Attending: Internal Medicine | Admitting: Internal Medicine

## 2024-05-07 ENCOUNTER — Other Ambulatory Visit: Payer: Self-pay

## 2024-05-07 DIAGNOSIS — F419 Anxiety disorder, unspecified: Secondary | ICD-10-CM | POA: Diagnosis present

## 2024-05-07 DIAGNOSIS — Z974 Presence of external hearing-aid: Secondary | ICD-10-CM

## 2024-05-07 DIAGNOSIS — Z818 Family history of other mental and behavioral disorders: Secondary | ICD-10-CM

## 2024-05-07 DIAGNOSIS — Z79890 Hormone replacement therapy (postmenopausal): Secondary | ICD-10-CM

## 2024-05-07 DIAGNOSIS — M6281 Muscle weakness (generalized): Secondary | ICD-10-CM | POA: Diagnosis present

## 2024-05-07 DIAGNOSIS — Z955 Presence of coronary angioplasty implant and graft: Secondary | ICD-10-CM

## 2024-05-07 DIAGNOSIS — I493 Ventricular premature depolarization: Secondary | ICD-10-CM | POA: Diagnosis present

## 2024-05-07 DIAGNOSIS — D696 Thrombocytopenia, unspecified: Secondary | ICD-10-CM | POA: Diagnosis present

## 2024-05-07 DIAGNOSIS — N178 Other acute kidney failure: Secondary | ICD-10-CM | POA: Diagnosis not present

## 2024-05-07 DIAGNOSIS — I5033 Acute on chronic diastolic (congestive) heart failure: Secondary | ICD-10-CM | POA: Diagnosis not present

## 2024-05-07 DIAGNOSIS — R5381 Other malaise: Secondary | ICD-10-CM | POA: Diagnosis present

## 2024-05-07 DIAGNOSIS — I6529 Occlusion and stenosis of unspecified carotid artery: Secondary | ICD-10-CM | POA: Diagnosis present

## 2024-05-07 DIAGNOSIS — I4719 Other supraventricular tachycardia: Secondary | ICD-10-CM | POA: Diagnosis present

## 2024-05-07 DIAGNOSIS — E785 Hyperlipidemia, unspecified: Secondary | ICD-10-CM | POA: Diagnosis present

## 2024-05-07 DIAGNOSIS — I130 Hypertensive heart and chronic kidney disease with heart failure and stage 1 through stage 4 chronic kidney disease, or unspecified chronic kidney disease: Principal | ICD-10-CM | POA: Diagnosis present

## 2024-05-07 DIAGNOSIS — N179 Acute kidney failure, unspecified: Secondary | ICD-10-CM | POA: Diagnosis not present

## 2024-05-07 DIAGNOSIS — Z881 Allergy status to other antibiotic agents status: Secondary | ICD-10-CM

## 2024-05-07 DIAGNOSIS — F32A Depression, unspecified: Secondary | ICD-10-CM | POA: Diagnosis present

## 2024-05-07 DIAGNOSIS — M109 Gout, unspecified: Secondary | ICD-10-CM | POA: Diagnosis present

## 2024-05-07 DIAGNOSIS — Y92239 Unspecified place in hospital as the place of occurrence of the external cause: Secondary | ICD-10-CM | POA: Diagnosis not present

## 2024-05-07 DIAGNOSIS — J9601 Acute respiratory failure with hypoxia: Secondary | ICD-10-CM | POA: Diagnosis present

## 2024-05-07 DIAGNOSIS — T501X5A Adverse effect of loop [high-ceiling] diuretics, initial encounter: Secondary | ICD-10-CM | POA: Diagnosis not present

## 2024-05-07 DIAGNOSIS — I428 Other cardiomyopathies: Secondary | ICD-10-CM | POA: Diagnosis present

## 2024-05-07 DIAGNOSIS — I5023 Acute on chronic systolic (congestive) heart failure: Secondary | ICD-10-CM | POA: Diagnosis not present

## 2024-05-07 DIAGNOSIS — N1831 Chronic kidney disease, stage 3a: Secondary | ICD-10-CM | POA: Diagnosis present

## 2024-05-07 DIAGNOSIS — I5043 Acute on chronic combined systolic (congestive) and diastolic (congestive) heart failure: Secondary | ICD-10-CM | POA: Diagnosis present

## 2024-05-07 DIAGNOSIS — Z5986 Financial insecurity: Secondary | ICD-10-CM

## 2024-05-07 DIAGNOSIS — K219 Gastro-esophageal reflux disease without esophagitis: Secondary | ICD-10-CM | POA: Diagnosis present

## 2024-05-07 DIAGNOSIS — J189 Pneumonia, unspecified organism: Secondary | ICD-10-CM | POA: Diagnosis not present

## 2024-05-07 DIAGNOSIS — I2489 Other forms of acute ischemic heart disease: Secondary | ICD-10-CM | POA: Diagnosis present

## 2024-05-07 DIAGNOSIS — Z885 Allergy status to narcotic agent status: Secondary | ICD-10-CM

## 2024-05-07 DIAGNOSIS — I161 Hypertensive emergency: Principal | ICD-10-CM | POA: Diagnosis present

## 2024-05-07 DIAGNOSIS — Z604 Social exclusion and rejection: Secondary | ICD-10-CM | POA: Diagnosis present

## 2024-05-07 DIAGNOSIS — I255 Ischemic cardiomyopathy: Secondary | ICD-10-CM | POA: Diagnosis present

## 2024-05-07 DIAGNOSIS — Z8719 Personal history of other diseases of the digestive system: Secondary | ICD-10-CM

## 2024-05-07 DIAGNOSIS — I252 Old myocardial infarction: Secondary | ICD-10-CM

## 2024-05-07 DIAGNOSIS — Z91040 Latex allergy status: Secondary | ICD-10-CM

## 2024-05-07 DIAGNOSIS — Z87440 Personal history of urinary (tract) infections: Secondary | ICD-10-CM

## 2024-05-07 DIAGNOSIS — I447 Left bundle-branch block, unspecified: Secondary | ICD-10-CM | POA: Diagnosis present

## 2024-05-07 DIAGNOSIS — E039 Hypothyroidism, unspecified: Secondary | ICD-10-CM | POA: Diagnosis present

## 2024-05-07 DIAGNOSIS — Z832 Family history of diseases of the blood and blood-forming organs and certain disorders involving the immune mechanism: Secondary | ICD-10-CM

## 2024-05-07 DIAGNOSIS — Z96653 Presence of artificial knee joint, bilateral: Secondary | ICD-10-CM | POA: Diagnosis present

## 2024-05-07 DIAGNOSIS — Z8619 Personal history of other infectious and parasitic diseases: Secondary | ICD-10-CM

## 2024-05-07 DIAGNOSIS — N1832 Chronic kidney disease, stage 3b: Secondary | ICD-10-CM

## 2024-05-07 DIAGNOSIS — J810 Acute pulmonary edema: Principal | ICD-10-CM

## 2024-05-07 DIAGNOSIS — Z8269 Family history of other diseases of the musculoskeletal system and connective tissue: Secondary | ICD-10-CM

## 2024-05-07 DIAGNOSIS — Z823 Family history of stroke: Secondary | ICD-10-CM

## 2024-05-07 DIAGNOSIS — Y95 Nosocomial condition: Secondary | ICD-10-CM | POA: Diagnosis present

## 2024-05-07 DIAGNOSIS — I2510 Atherosclerotic heart disease of native coronary artery without angina pectoris: Secondary | ICD-10-CM | POA: Diagnosis present

## 2024-05-07 DIAGNOSIS — Z888 Allergy status to other drugs, medicaments and biological substances status: Secondary | ICD-10-CM

## 2024-05-07 DIAGNOSIS — I429 Cardiomyopathy, unspecified: Secondary | ICD-10-CM | POA: Diagnosis not present

## 2024-05-07 DIAGNOSIS — N390 Urinary tract infection, site not specified: Secondary | ICD-10-CM | POA: Diagnosis present

## 2024-05-07 DIAGNOSIS — Z9889 Other specified postprocedural states: Secondary | ICD-10-CM

## 2024-05-07 DIAGNOSIS — Z79899 Other long term (current) drug therapy: Secondary | ICD-10-CM

## 2024-05-07 DIAGNOSIS — Z820 Family history of epilepsy and other diseases of the nervous system: Secondary | ICD-10-CM

## 2024-05-07 DIAGNOSIS — Z803 Family history of malignant neoplasm of breast: Secondary | ICD-10-CM

## 2024-05-07 DIAGNOSIS — Z7902 Long term (current) use of antithrombotics/antiplatelets: Secondary | ICD-10-CM

## 2024-05-07 DIAGNOSIS — Z9181 History of falling: Secondary | ICD-10-CM

## 2024-05-07 DIAGNOSIS — Z7982 Long term (current) use of aspirin: Secondary | ICD-10-CM

## 2024-05-07 DIAGNOSIS — Z8673 Personal history of transient ischemic attack (TIA), and cerebral infarction without residual deficits: Secondary | ICD-10-CM

## 2024-05-07 DIAGNOSIS — Z8249 Family history of ischemic heart disease and other diseases of the circulatory system: Secondary | ICD-10-CM

## 2024-05-07 DIAGNOSIS — Z9861 Coronary angioplasty status: Secondary | ICD-10-CM | POA: Diagnosis not present

## 2024-05-07 DIAGNOSIS — Z8042 Family history of malignant neoplasm of prostate: Secondary | ICD-10-CM

## 2024-05-07 DIAGNOSIS — I160 Hypertensive urgency: Secondary | ICD-10-CM | POA: Diagnosis not present

## 2024-05-07 LAB — RESP PANEL BY RT-PCR (RSV, FLU A&B, COVID)  RVPGX2
Influenza A by PCR: NEGATIVE
Influenza B by PCR: NEGATIVE
Resp Syncytial Virus by PCR: NEGATIVE
SARS Coronavirus 2 by RT PCR: NEGATIVE

## 2024-05-07 LAB — CBC WITH DIFFERENTIAL/PLATELET
Abs Immature Granulocytes: 0.1 K/uL — ABNORMAL HIGH (ref 0.00–0.07)
Basophils Absolute: 0.1 K/uL (ref 0.0–0.1)
Basophils Relative: 0 %
Eosinophils Absolute: 0.2 K/uL (ref 0.0–0.5)
Eosinophils Relative: 1 %
HCT: 32.9 % — ABNORMAL LOW (ref 36.0–46.0)
Hemoglobin: 11 g/dL — ABNORMAL LOW (ref 12.0–15.0)
Immature Granulocytes: 1 %
Lymphocytes Relative: 13 %
Lymphs Abs: 1.7 K/uL (ref 0.7–4.0)
MCH: 32.1 pg (ref 26.0–34.0)
MCHC: 33.4 g/dL (ref 30.0–36.0)
MCV: 95.9 fL (ref 80.0–100.0)
Monocytes Absolute: 0.5 K/uL (ref 0.1–1.0)
Monocytes Relative: 4 %
Neutro Abs: 11 K/uL — ABNORMAL HIGH (ref 1.7–7.7)
Neutrophils Relative %: 81 %
Platelets: 177 K/uL (ref 150–400)
RBC: 3.43 MIL/uL — ABNORMAL LOW (ref 3.87–5.11)
RDW: 14.6 % (ref 11.5–15.5)
WBC: 13.5 K/uL — ABNORMAL HIGH (ref 4.0–10.5)
nRBC: 0 % (ref 0.0–0.2)

## 2024-05-07 LAB — MAGNESIUM: Magnesium: 1.8 mg/dL (ref 1.7–2.4)

## 2024-05-07 LAB — LACTIC ACID, PLASMA: Lactic Acid, Venous: 1.6 mmol/L (ref 0.5–1.9)

## 2024-05-07 LAB — URINALYSIS, W/ REFLEX TO CULTURE (INFECTION SUSPECTED)
Bacteria, UA: NONE SEEN
Bilirubin Urine: NEGATIVE
Glucose, UA: NEGATIVE mg/dL
Hgb urine dipstick: NEGATIVE
Ketones, ur: NEGATIVE mg/dL
Nitrite: NEGATIVE
Protein, ur: NEGATIVE mg/dL
Specific Gravity, Urine: 1.013 (ref 1.005–1.030)
pH: 5 (ref 5.0–8.0)

## 2024-05-07 LAB — COMPREHENSIVE METABOLIC PANEL WITH GFR
ALT: 19 U/L (ref 0–44)
AST: 26 U/L (ref 15–41)
Albumin: 4.1 g/dL (ref 3.5–5.0)
Alkaline Phosphatase: 104 U/L (ref 38–126)
Anion gap: 12 (ref 5–15)
BUN: 31 mg/dL — ABNORMAL HIGH (ref 8–23)
CO2: 23 mmol/L (ref 22–32)
Calcium: 9.2 mg/dL (ref 8.9–10.3)
Chloride: 102 mmol/L (ref 98–111)
Creatinine, Ser: 1.33 mg/dL — ABNORMAL HIGH (ref 0.44–1.00)
GFR, Estimated: 38 mL/min — ABNORMAL LOW (ref >60)
Glucose, Bld: 149 mg/dL — ABNORMAL HIGH (ref 70–99)
Potassium: 3.9 mmol/L (ref 3.5–5.1)
Sodium: 137 mmol/L (ref 135–145)
Total Bilirubin: 0.6 mg/dL (ref 0.0–1.2)
Total Protein: 7.3 g/dL (ref 6.5–8.1)

## 2024-05-07 LAB — BLOOD GAS, VENOUS

## 2024-05-07 LAB — PROTIME-INR
INR: 1 (ref 0.8–1.2)
Prothrombin Time: 13.9 s (ref 11.4–15.2)

## 2024-05-07 LAB — LIPASE, BLOOD: Lipase: 39 U/L (ref 11–51)

## 2024-05-07 LAB — TROPONIN I (HIGH SENSITIVITY): Troponin I (High Sensitivity): 89 ng/L — ABNORMAL HIGH (ref <18)

## 2024-05-07 LAB — BRAIN NATRIURETIC PEPTIDE: B Natriuretic Peptide: 585.4 pg/mL — ABNORMAL HIGH (ref 0.0–100.0)

## 2024-05-07 MED ORDER — CARVEDILOL 6.25 MG PO TABS: 3.1250 mg | ORAL_TABLET | Freq: Two times a day (BID) | ORAL | Status: DC

## 2024-05-07 MED ORDER — LINEZOLID 600 MG/300ML IV SOLN
600.0000 mg | Freq: Two times a day (BID) | INTRAVENOUS | Status: DC
  Filled 2024-05-07: qty 300

## 2024-05-07 MED ORDER — LOSARTAN POTASSIUM 50 MG PO TABS: 25.0000 mg | ORAL_TABLET | Freq: Every day | ORAL | Status: DC

## 2024-05-07 MED ORDER — FUROSEMIDE 10 MG/ML IJ SOLN: 40.0000 mg | Freq: Two times a day (BID) | INTRAMUSCULAR | Status: DC

## 2024-05-07 MED ORDER — SODIUM CHLORIDE 0.9 % IV SOLN
2.0000 g | Freq: Once | INTRAVENOUS | Status: AC
  Administered 2024-05-07: 2 g via INTRAVENOUS
  Filled 2024-05-07: qty 12.5

## 2024-05-07 MED ORDER — FUROSEMIDE 10 MG/ML IJ SOLN
20.0000 mg | Freq: Once | INTRAMUSCULAR | Status: AC
  Administered 2024-05-07: 20 mg via INTRAVENOUS
  Filled 2024-05-07: qty 4

## 2024-05-07 MED ORDER — ASPIRIN 81 MG PO TBEC
81.0000 mg | DELAYED_RELEASE_TABLET | Freq: Every day | ORAL | Status: DC
  Administered 2024-05-08 – 2024-05-11 (×4): 81 mg via ORAL
  Filled 2024-05-07 (×4): qty 1

## 2024-05-07 MED ORDER — ISOSORBIDE MONONITRATE ER 60 MG PO TB24: 60.0000 mg | ORAL_TABLET | Freq: Two times a day (BID) | ORAL | Status: DC

## 2024-05-07 MED ORDER — CLOPIDOGREL BISULFATE 75 MG PO TABS
75.0000 mg | ORAL_TABLET | Freq: Every day | ORAL | Status: DC
  Administered 2024-05-08 – 2024-05-11 (×4): 75 mg via ORAL
  Filled 2024-05-07 (×4): qty 1

## 2024-05-07 MED ORDER — NITROGLYCERIN 0.4 MG SL SUBL: 0.4000 mg | SUBLINGUAL_TABLET | SUBLINGUAL | Status: DC | PRN

## 2024-05-07 MED ORDER — ATORVASTATIN CALCIUM 20 MG PO TABS
40.0000 mg | ORAL_TABLET | Freq: Every day | ORAL | Status: DC
  Administered 2024-05-08 – 2024-05-11 (×4): 40 mg via ORAL
  Filled 2024-05-07 (×4): qty 2

## 2024-05-07 MED ORDER — HYDRALAZINE HCL 50 MG PO TABS
100.0000 mg | ORAL_TABLET | Freq: Three times a day (TID) | ORAL | Status: DC
  Administered 2024-05-08 – 2024-05-11 (×11): 100 mg via ORAL
  Filled 2024-05-07 (×12): qty 2

## 2024-05-07 MED ORDER — MELATONIN 5 MG PO TABS
10.0000 mg | ORAL_TABLET | Freq: Every day | ORAL | Status: DC
  Administered 2024-05-08 – 2024-05-10 (×3): 10 mg via ORAL
  Filled 2024-05-07 (×4): qty 2

## 2024-05-07 MED ORDER — LINEZOLID 600 MG/300ML IV SOLN
600.0000 mg | Freq: Two times a day (BID) | INTRAVENOUS | Status: DC
  Administered 2024-05-07: 600 mg via INTRAVENOUS
  Filled 2024-05-07: qty 300

## 2024-05-07 MED ORDER — ALBUTEROL SULFATE (2.5 MG/3ML) 0.083% IN NEBU: 2.5000 mg | INHALATION_SOLUTION | RESPIRATORY_TRACT | Status: DC | PRN

## 2024-05-07 MED ORDER — ACETAMINOPHEN 650 MG RE SUPP: 650.0000 mg | Freq: Four times a day (QID) | RECTAL | Status: DC | PRN

## 2024-05-07 MED ORDER — LEVOTHYROXINE SODIUM 137 MCG PO TABS
137.0000 ug | ORAL_TABLET | Freq: Every day | ORAL | Status: DC
  Administered 2024-05-08 – 2024-05-11 (×4): 137 ug via ORAL
  Filled 2024-05-07 (×4): qty 1

## 2024-05-07 MED ORDER — SERTRALINE HCL 50 MG PO TABS
25.0000 mg | ORAL_TABLET | Freq: Every day | ORAL | Status: DC
  Administered 2024-05-08 – 2024-05-11 (×4): 25 mg via ORAL
  Filled 2024-05-07 (×4): qty 1

## 2024-05-07 MED ORDER — ONDANSETRON HCL 4 MG PO TABS
4.0000 mg | ORAL_TABLET | Freq: Four times a day (QID) | ORAL | Status: DC | PRN
Start: 2024-05-07 — End: 2024-05-11

## 2024-05-07 MED ORDER — NITROGLYCERIN 0.4 MG SL SUBL
0.4000 mg | SUBLINGUAL_TABLET | SUBLINGUAL | Status: DC | PRN
  Filled 2024-05-07: qty 1

## 2024-05-07 MED ORDER — ENOXAPARIN SODIUM 30 MG/0.3ML IJ SOSY
30.0000 mg | PREFILLED_SYRINGE | INTRAMUSCULAR | Status: DC
  Administered 2024-05-08 – 2024-05-11 (×4): 30 mg via SUBCUTANEOUS
  Filled 2024-05-07 (×4): qty 0.3

## 2024-05-07 MED ORDER — ACETAMINOPHEN 325 MG PO TABS: 650.0000 mg | ORAL_TABLET | Freq: Four times a day (QID) | ORAL | Status: DC | PRN

## 2024-05-07 MED ORDER — AMITRIPTYLINE HCL 10 MG PO TABS
20.0000 mg | ORAL_TABLET | Freq: Every day | ORAL | Status: DC
Start: 2024-05-08 — End: 2024-05-11
  Administered 2024-05-08 – 2024-05-10 (×3): 20 mg via ORAL
  Filled 2024-05-07 (×3): qty 2

## 2024-05-07 MED ORDER — PANTOPRAZOLE SODIUM 40 MG PO TBEC
40.0000 mg | DELAYED_RELEASE_TABLET | Freq: Every day | ORAL | Status: DC
  Administered 2024-05-08 – 2024-05-11 (×4): 40 mg via ORAL
  Filled 2024-05-07 (×4): qty 1

## 2024-05-07 MED ORDER — NITROGLYCERIN 2 % TD OINT
0.5000 [in_us] | TOPICAL_OINTMENT | Freq: Four times a day (QID) | TRANSDERMAL | Status: DC
  Administered 2024-05-08 – 2024-05-09 (×6): 0.5 [in_us] via TOPICAL
  Filled 2024-05-07 (×6): qty 1

## 2024-05-07 MED ORDER — ONDANSETRON HCL 4 MG/2ML IJ SOLN
4.0000 mg | Freq: Four times a day (QID) | INTRAMUSCULAR | Status: DC | PRN
  Administered 2024-05-08: 4 mg via INTRAVENOUS
  Filled 2024-05-07: qty 2

## 2024-05-07 MED ORDER — POTASSIUM CHLORIDE ER 10 MEQ PO TBCR: 10.0000 meq | EXTENDED_RELEASE_TABLET | Freq: Every day | ORAL | Status: DC | PRN

## 2024-05-07 NOTE — Progress Notes (Signed)
 CODE SEPSIS - PHARMACY COMMUNICATION  **Broad Spectrum Antibiotics should be administered within 1 hour of Sepsis diagnosis**  Time Code Sepsis Called/Page Received: 2200  Antibiotics Ordered: Cefepime   Time of 1st antibiotic administration: 2249  Rankin CANDIE Dills, PharmD, [facility] 05/07/2024 10:03 PM

## 2024-05-07 NOTE — Assessment & Plan Note (Addendum)
 Hypertensive emergency Acute respiratory failure with hypoxia on BiPAP Recent echo with EF 45 to 50% and G1 DD IV Lasix  Nitroglycerin  ointment Continue GDMT with carvedilol , Imdur , losartan , Continue hydralazine  Continue BiPAP and wean as tolerated Follow-up lower extremity venous Doppler to evaluate for DVT /need for further workup for PE

## 2024-05-07 NOTE — Assessment & Plan Note (Addendum)
 Chest x-ray showing left lower lobe pneumonia Continue vancomycin  and Zyvox  started in the ED Antitussives, albuterol  as needed

## 2024-05-07 NOTE — Sepsis Progress Note (Signed)
 Elink monitoring for the code sepsis protocol.

## 2024-05-07 NOTE — H&P (Signed)
 History and Physical    Patient: Stephanie Charles FMW:[id number] DOB: 04-Jun-1930 DOA: 05/07/2024 DOS: the patient was seen and examined on 05/07/2024 PCP: Bertrum Charlie CROME, MD  Patient coming from: Home  Chief Complaint:  Chief Complaint  Patient presents with   Shortness of Breath    HPI: Stephanie Charles is a 89 y.o. female with medical history significant for LBBB, HTN, hypothyroidism, CKD 3 A CAD s/p stent, HFrEF hospitalized a month ago from 7/6 to 7/10 with flash pulmonary edema (EF 45 to 50%, G1 DD) being admitted with recurrent CHF exacerbation requiring BiPAP and possible pneumonia.  She arrived by EMS from home with shortness of breath and bilateral lower extremity edema.  She is currently on antibiotics for UTI. In the ED, BP 216/114, tachycardic to 113.  O2 sat 79% improving to 100% on BiPAP. EKG with junctional tachycardia at 110 and LBBB Chest x-ray consistent with pulmonary edema and possible left lower lobe pneumonia. Labs with white cell count 13,500 and normal lactic acid of 1.6. Troponin 89 and BNP 585 Lipase and LFTs WNL Lower extremity venous ultrasound pending. Patient treated with IV Lasix , started on cefepime  and Zyvox  for HCAP. Admission requested.     Past Medical History:  Diagnosis Date   Anemia    C. difficile diarrhea    CHF (congestive heart failure) (HCC) 10/05/2021   Diverticulitis    GERD (gastroesophageal reflux disease)    Hyperlipidemia    Hypertension    Hypothyroidism    Myocardial infarction (HCC) 08/2017   TIA (transient ischemic attack)    1 approx 2015, 1 approx 2018   Vertigo    last episode several months ago   [name]    Past Surgical History:  Procedure Laterality Date   COLONOSCOPY WITH PROPOFOL  N/A 06/22/2018   Procedure: COLONOSCOPY WITH PROPOFOL ;  Surgeon: Therisa Bi, MD;  Location: [facility] ENDOSCOPY;  Service: Endoscopy;  Laterality: N/A;   CORONARY STENT INTERVENTION N/A 06/07/2019   Procedure: CORONARY  STENT INTERVENTION;  Surgeon: Darron Deatrice LABOR, MD;  Location: [facility] INVASIVE CV LAB;  Service: Cardiovascular;  Laterality: N/A;   ESOPHAGOGASTRODUODENOSCOPY (EGD) WITH PROPOFOL  N/A 06/22/2018   Procedure: ESOPHAGOGASTRODUODENOSCOPY (EGD) WITH PROPOFOL ;  Surgeon: Therisa Bi, MD;  Location: [facility] ENDOSCOPY;  Service: Endoscopy;  Laterality: N/A;   KNEE ARTHROSCOPY Right    REPLACEMENT TOTAL KNEE BILATERAL     RIGHT/LEFT HEART CATH AND CORONARY ANGIOGRAPHY N/A 06/07/2019   Procedure: RIGHT/LEFT HEART CATH AND CORONARY ANGIOGRAPHY;  Surgeon: Perla Evalene PARAS, MD;  Location: [facility] INVASIVE CV LAB;  Service: Cardiovascular;  Laterality: N/A;   SHOULDER SURGERY Left    Social History:  reports that she has never smoked. She has never used smokeless tobacco. She reports that she does not drink alcohol and does not use drugs.  Allergies  Allergen Reactions   Codeine Nausea And Vomiting and Nausea Only   Levofloxacin Other (See Comments)    Mouth sores   Lisinopril Other (See Comments) and Cough    Cough?   Other Itching   Povidone-Iodine Other (See Comments), Rash and Dermatitis    Severe blistering and itchiness. Severe redness   Simvastatin Other (See Comments)    Myalgias   Latex Itching    Family History  Problem Relation Age of Onset   Heart disease Mother    Hypertension Mother    Stroke Mother    Heart disease Father    Breast cancer Sister    Alzheimer's disease  Brother    Heart attack Brother    Stroke Brother    Heart disease Brother    Mental illness Brother        died suicide   Cancer Brother        prostate   Heart disease Brother        atrial fib   Alzheimer's disease Brother    Clotting disorder Daughter    Fibromyalgia Daughter     Prior to Admission medications   Medication Sig Start Date End Date Taking? Authorizing Provider  albuterol  (PROVENTIL ) (2.5 MG/3ML) 0.083% nebulizer solution USE 1 VIAL IN NEBULIZER EVERY 4 HOURS AS NEEDED 01/04/22   Bertrum Charlie CROME, MD  allopurinol  (ZYLOPRIM ) 300 MG tablet Take 300 mg by mouth daily. 10/25/23   [provider]  amitriptyline  (ELAVIL ) 10 MG tablet Take 1 tablet (10 mg total) by mouth at bedtime. Patient taking differently: Take 20 mg by mouth at bedtime. 12/27/22   Gasper Nancyann BRAVO, MD  ASPIRIN  LOW DOSE 81 MG EC tablet NEW PRESCRIPTION REQUEST: TAKE ONE TABLET BY MOUTH EVERY DAY 08/03/21   Bertrum Charlie CROME, MD  atorvastatin  (LIPITOR) 40 MG tablet Take 1 tablet by mouth once daily 03/23/24   Gollan, Timothy J, MD  carvedilol  (COREG ) 3.125 MG tablet Take 1 tablet (3.125 mg total) by mouth 2 (two) times daily with a meal. 11/08/23   Gollan, Timothy J, MD  clopidogrel  (PLAVIX ) 75 MG tablet Take 1 tablet by mouth once daily 04/05/24   Gollan, Timothy J, MD  cyclobenzaprine  (FLEXERIL ) 5 MG tablet Take 5 mg by mouth 3 (three) times daily as needed. 09/13/23   [provider]  ferrous sulfate  325 (65 FE) MG tablet Take 325 mg by mouth at bedtime.    [provider]  fluorometholone  (FML) 0.1 % ophthalmic suspension Place 1 drop into both eyes 2 (two) times daily. 01/17/24   [provider]  gabapentin  (NEURONTIN ) 100 MG capsule Take 1 capsule by mouth at bedtime. 11/10/23   [provider]  hydrALAZINE  (APRESOLINE ) 100 MG tablet Take 1 tablet (100 mg total) by mouth 3 (three) times daily. TAKE 1 TABLET BY MOUTH THREE TIMES DAILY FOR HIGH BLOOD PRESSURE 01/13/24   Furth, Cadence H, PA-C  isosorbide  mononitrate (IMDUR ) 60 MG 24 hr tablet Take 1 tablet (60 mg total) by mouth 2 (two) times daily. 11/08/23   Gollan, Timothy J, MD  levothyroxine  (SYNTHROID ) 137 MCG tablet Take 1 tablet (137 mcg total) by mouth daily before breakfast. 04/12/22   Bertrum Charlie CROME, MD  losartan  (COZAAR ) 25 MG tablet Take 1 tablet (25 mg total) by mouth daily. 04/23/24   Gollan, Timothy J, MD  Melatonin 10 MG TABS Take 10 tablets by mouth at bedtime.    [provider]  mirtazapine  (REMERON )  30 MG tablet TAKE 1 TABLET BY MOUTH AT BEDTIME 04/21/22   Bertrum Charlie CROME, MD  nitroGLYCERIN  (NITROSTAT ) 0.4 MG SL tablet Place 1 tablet (0.4 mg total) under the tongue every 5 (five) minutes as needed for chest pain. 04/09/22   Gollan, Timothy J, MD  pantoprazole  (PROTONIX ) 40 MG tablet Take 1 tablet (40 mg total) by mouth daily. 11/08/23   Gollan, Timothy J, MD  potassium chloride  (KLOR-CON ) 10 MEQ tablet Take 1 tablet (10 mEq total) by mouth daily as needed (when you take torsemide ). 11/08/23   Gollan, Timothy J, MD  sertraline  (ZOLOFT ) 25 MG tablet Take 1 tablet by mouth once daily 04/21/22   Bertrum,  Charlie CROME, MD    Physical Exam: Vitals:   05/07/24 2149 05/07/24 2150 05/07/24 2200 05/07/24 2230  BP:   (!) 216/114 (!) 190/104  Pulse:   (!) 113 99  Resp:   14 19  SpO2: 91% 91% (!) 79% 100%   Physical Exam Vitals and nursing note reviewed.  Constitutional:      Comments: On BiPAP  HENT:     Head: Normocephalic and atraumatic.  Cardiovascular:     Rate and Rhythm: Regular rhythm. Tachycardia present.     Heart sounds: Normal heart sounds.  Pulmonary:     Effort: Pulmonary effort is normal.     Breath sounds: Normal breath sounds.  Abdominal:     Palpations: Abdomen is soft.     Tenderness: There is no abdominal tenderness.  Neurological:     Mental Status: Mental status is at baseline. She is lethargic.     Labs on Admission: I have personally reviewed following labs and imaging studies  CBC: Recent Labs  Lab 05/07/24 2205  WBC 13.5*  NEUTROABS 11.0*  HGB 11.0*  HCT 32.9*  MCV 95.9  PLT 177   Basic Metabolic Panel: Recent Labs  Lab 05/07/24 2205  NA 137  K 3.9  CL 102  CO2 23  GLUCOSE 149*  BUN 31*  CREATININE 1.33*  CALCIUM  9.2  MG 1.8   GFR: CrCl cannot be calculated (Unknown ideal weight.). Liver Function Tests: Recent Labs  Lab 05/07/24 2205  AST 26  ALT 19  ALKPHOS 104  BILITOT 0.6  PROT 7.3  ALBUMIN  4.1   Recent Labs  Lab 05/07/24 2205   LIPASE 39   No results for input(s): AMMONIA in the last 168 hours. Coagulation Profile: Recent Labs  Lab 05/07/24 2205  INR 1.0   Cardiac Enzymes: No results for input(s): CKTOTAL, CKMB, CKMBINDEX, TROPONINI in the last 168 hours. BNP (last 3 results) No results for input(s): PROBNP in the last 8760 hours. HbA1C: No results for input(s): HGBA1C in the last 72 hours. CBG: No results for input(s): GLUCAP in the last 168 hours. Lipid Profile: No results for input(s): CHOL, HDL, LDLCALC, TRIG, CHOLHDL, LDLDIRECT in the last 72 hours. Thyroid  Function Tests: No results for input(s): TSH, T4TOTAL, FREET4, T3FREE, THYROIDAB in the last 72 hours. Anemia Panel: No results for input(s): VITAMINB12, FOLATE, FERRITIN, TIBC, IRON, RETICCTPCT in the last 72 hours. Urine analysis:    Component Value Date/Time   COLORURINE YELLOW (A) 05/07/2024 2227   APPEARANCEUR CLEAR (A) 05/07/2024 2227   APPEARANCEUR Clear 07/11/2018 1326   LABSPEC 1.013 05/07/2024 2227   LABSPEC 1.011 01/22/2013 1416   PHURINE 5.0 05/07/2024 2227   GLUCOSEU NEGATIVE 05/07/2024 2227   GLUCOSEU Negative 01/22/2013 1416   HGBUR NEGATIVE 05/07/2024 2227   BILIRUBINUR NEGATIVE 05/07/2024 2227   BILIRUBINUR Negative 07/11/2018 1326   BILIRUBINUR Negative 01/22/2013 1416   KETONESUR NEGATIVE 05/07/2024 2227   PROTEINUR NEGATIVE 05/07/2024 2227   UROBILINOGEN 0.2 03/22/2018 1053   NITRITE NEGATIVE 05/07/2024 2227   LEUKOCYTESUR TRACE (A) 05/07/2024 2227   LEUKOCYTESUR 3+ 01/22/2013 1416    Radiological Exams on Admission: DG Chest Port 1 View Result Date: 05/07/2024 CLINICAL DATA:  Questionable sepsis - evaluate for abnormality EXAM: PORTABLE CHEST 1 VIEW COMPARISON:  Chest x-ray 04/09/2024, CT chest 07/28/2023. FINDINGS: The heart and mediastinal contours are within normal limits. Left lower lobe consolidation. Increased coarsened interstitial markings. No pleural  effusion. No pneumothorax. No acute osseous abnormality. IMPRESSION: 1. Left lower lobe consolidation.  Recommend further evaluation with PA and lateral view of the chest. 2. Increased coarsened interstitial markings. Finding may represent infection versus pulmonary edema. 3.  Aortic Atherosclerosis (ICD10-I70.0). Electronically Signed   By: Morgane  Naveau M.D.   On: 05/07/2024 22:44   Data Reviewed for HPI: Relevant notes from primary care and specialist visits, past discharge summaries as available in EHR, including Care Everywhere. Prior diagnostic testing as pertinent to current admission diagnoses Updated medications and problem lists for reconciliation ED course, including vitals, labs, imaging, treatment and response to treatment Triage notes, nursing and pharmacy notes and ED provider's notes Notable results as noted above in HPI      Assessment and Plan: * Acute on chronic HFrEF (heart failure with reduced ejection fraction) (HCC) Hypertensive emergency Acute respiratory failure with hypoxia on BiPAP Recent echo with EF 45 to 50% and G1 DD IV Lasix  Nitroglycerin  ointment Continue GDMT with carvedilol , Imdur , losartan , Continue hydralazine  Continue BiPAP and wean as tolerated Follow-up lower extremity venous Doppler to evaluate for DVT /need for further workup for PE  HCAP (healthcare-associated pneumonia) Chest x-ray showing left lower lobe pneumonia Continue vancomycin  and Zyvox  started in the ED Antitussives, albuterol  as needed  CAD S/P percutaneous coronary angioplasty Elevated troponin No complaints of chest pain, EKG nonischemic, troponin 89-->170 Continue to trend-->suspect demand ischemia Continue aspirin , statin, beta-blocker and nitroglycerin  sublingual as needed chest pain Cardiology consult requested  Chronic kidney disease, stage 3a (HCC) At baseline    DVT prophylaxis: Lovenox   Consults: none  Advance Care Planning:   Code Status: Prior   Family  Communication: none  Disposition Plan: Back to previous home environment  Severity of Illness: The appropriate patient status for this patient is OBSERVATION. Observation status is judged to be reasonable and necessary in order to provide the required intensity of service to ensure the patient's safety. The patient's presenting symptoms, physical exam findings, and initial radiographic and laboratory data in the context of their medical condition is felt to place them at decreased risk for further clinical deterioration. Furthermore, it is anticipated that the patient will be medically stable for discharge from the hospital within 2 midnights of admission.   Author: Delayne LULLA Solian, MD 05/07/2024 11:47 PM  For on call review [email or web address].

## 2024-05-07 NOTE — Assessment & Plan Note (Addendum)
 No complaints of chest pain, EKG nonischemic, troponin 89 Continue to trend Continue aspirin , statin, beta-blocker and nitroglycerin  sublingual as needed chest pain

## 2024-05-07 NOTE — Assessment & Plan Note (Signed)
 At baseline

## 2024-05-07 NOTE — ED Triage Notes (Signed)
 Pt arrived via [facility] from home with bilateral lower extremity edema/pain and SOB. Pt recently diagnosed and currently being treated for UTI, was hospitalized. Pt tachypenic with labored breathing.

## 2024-05-07 NOTE — ED Provider Notes (Signed)
 [facility] Provider Note    Event Date/Time   First MD Initiated Contact with Patient 05/07/24 2143     (approximate)   History   Shortness of Breath   HPI  Stephanie Charles is a 89 y.o. female  with medical history significant for LBBB, hypertension, CKD II, hyperlipidemia, hypothyroidism, anxiety, depression, CAD, Hx MI and Stent, CHF, Diverticulitis, GERD, TIA, HOH left ear (hearing aid) history of fall in March 2025 and recent hospitalization discharged on 04/12/2024 for flash pulmonary edema who presents to our emergency department with shortness of breath, chest tightness and generalized weakness.  History is only obtained by EMS as there is no family at bedside.  She does not typically use oxygen  at baseline.  She is currently being treated for UTI.  She is unable to contribute much secondary to the acuity of her condition as she is hypoxic to the 70s and hypertensive to the 200s      Physical Exam   Triage Vital Signs: ED Triage Vitals [05/07/24 2149]  Encounter Vitals Group     BP      Girls Systolic BP Percentile      Girls Diastolic BP Percentile      Boys Systolic BP Percentile      Boys Diastolic BP Percentile      Pulse      Resp      Temp      Temp src      SpO2 91 %     Weight      Height      Head Circumference      Peak Flow      Pain Score      Pain Loc      Pain Education      Exclude from Growth Chart     Most recent vital signs: Vitals:   05/07/24 2200 05/07/24 2230  BP: (!) 216/114 (!) 190/104  Pulse: (!) 113 99  Resp: 14 19  SpO2: (!) 79% 100%    Nursing Triage Note reviewed. Vital signs reviewed and patients oxygen  saturation is hypoxic  General: Patient is well nourished, well developed, awake and alert, in acute distress Head: Normocephalic and atraumatic Eyes: Normal inspection, extraocular muscles intact, no conjunctival pallor Ear, nose, throat: Normal external exam Neck: Normal range of  motion Respiratory: Patient is in respiratory distress, lungs rales and rhonchi Cardiovascular: Patient is tachycardic, RR  GI: Abd SNT with no guarding or rebound Extremities: pulses intact with good cap refills, 2+ LE pitting with edema no calf tenderness Neuro: The patient is alert and oriented to person, place unclear time, with 5/5 bilat UE/LE strength, no gross motor or sensory defects noted. Coordination appears to be adequate. Skin: Warm, dry, and intact Psych: Anxious mood and affect,   ED Results / Procedures / Treatments   Labs (all labs ordered are listed, but only abnormal results are displayed) Labs Reviewed  COMPREHENSIVE METABOLIC PANEL WITH GFR - Abnormal; Notable for the following components:      Result Value   Glucose, Bld 149 (*)    BUN 31 (*)    Creatinine, Ser 1.33 (*)    GFR, Estimated 38 (*)    All other components within normal limits  CBC WITH DIFFERENTIAL/PLATELET - Abnormal; Notable for the following components:   WBC 13.5 (*)    RBC 3.43 (*)    Hemoglobin 11.0 (*)    HCT 32.9 (*)    Neutro Abs 11.0 (*)  Abs Immature Granulocytes 0.10 (*)    All other components within normal limits  BRAIN NATRIURETIC PEPTIDE - Abnormal; Notable for the following components:   B Natriuretic Peptide 585.4 (*)    All other components within normal limits  BLOOD GAS, VENOUS - Abnormal; Notable for the following components:   pCO2, Ven 39 (*)    pO2, Ven 31 (*)    All other components within normal limits  URINALYSIS, W/ REFLEX TO CULTURE (INFECTION SUSPECTED) - Abnormal; Notable for the following components:   Color, Urine YELLOW (*)    APPearance CLEAR (*)    Leukocytes,Ua TRACE (*)    All other components within normal limits  TROPONIN I (HIGH SENSITIVITY) - Abnormal; Notable for the following components:   Troponin I (High Sensitivity) 89 (*)    All other components within normal limits  RESP PANEL BY RT-PCR (RSV, FLU A&B, COVID)  [id number]  CULTURE, BLOOD  (ROUTINE X 2)  CULTURE, BLOOD (ROUTINE X 2)  LACTIC ACID, PLASMA  PROTIME-INR  LIPASE, BLOOD  MAGNESIUM   LACTIC ACID, PLASMA     EKG EKG and rhythm strip are interpreted by myself: 21:48  EKG: [tachycardic sinus rhythm] at heart rate of 116, wide QRS, LBB, QTc 530, nonspecific ST segments and T waves no ectopy EKG not consistent with Acute STEMI Rhythm strip: Tachycardic sinus rhythm in lead II -- EKG and rhythm strip are interpreted by myself: 2210  EKG: [tachycardic sinus rhythm] at heart rate of 116, wide QRS, LBB, QTc 530, nonspecific ST segments and T waves no ectopy EKG not consistent with Acute STEMI Rhythm strip: Tachycardic sinus rhythm in lead II   RADIOLOGY Xray chest: Pleural edema with opacities on my independent review and interpretation radiologist agrees that there is a left lower consolidation    PROCEDURES:  Critical Care performed: Yes, see critical care procedure note(s)  .Critical Care  Performed by: Nicholaus Rolland BRAVO, MD Authorized by: Nicholaus Rolland BRAVO, MD   Critical care provider statement:    Critical care time (minutes):  35   Critical care time was exclusive of:  Separately billable procedures and treating other patients   Critical care was necessary to treat or prevent imminent or life-threatening deterioration of the following conditions:  Respiratory failure and sepsis   Critical care was time spent personally by me on the following activities:  Development of treatment plan with patient or surrogate, discussions with consultants, evaluation of patient's response to treatment, examination of patient, ordering and review of laboratory studies, ordering and review of radiographic studies, ordering and performing treatments and interventions, pulse oximetry, re-evaluation of patient's condition and review of old charts   Care discussed with: admitting provider   Comments:     Management of BiPAP and sepsis    MEDICATIONS ORDERED IN  ED: Medications  nitroGLYCERIN  (NITROSTAT ) SL tablet 0.4 mg (has no administration in time range)  linezolid  (ZYVOX ) IVPB 600 mg (has no administration in time range)  ceFEPIme  (MAXIPIME ) 2 g in sodium chloride  0.9 % 100 mL IVPB (2 g Intravenous New Bag/Given 05/07/24 2249)  furosemide  (LASIX ) injection 20 mg (20 mg Intravenous Given 05/07/24 2320)     IMPRESSION / MDM / ASSESSMENT AND PLAN / ED COURSE                                Differential diagnosis includes, but is not limited to, flash pulmonary edema, pneumonia, CHF exacerbation, arrhythmia, sepsis,  UTI  ED course: Patient arrives acutely profoundly hypoxic on oxygen  with increased respiratory accessory use and hypertensive.  Presentation consistent with flash pulmonary edema and she was given a sublingual nitroglycerin .  EKG x 2 stable.  Chest x-ray consistent with pneumonia decision made to treat for HCAP.  Troponin elevated but not as much as previous.  Patient much improved on the BiPAP.  Anticipate she will need to be on this for a period of time.  Will initiate some Lasix  and discussed case with hospitalist   Clinical Course as of 05/07/24 05/16/21  Mon May 07, 2024  2247/05/17 Troponin I (High Sensitivity)(!): 89 Elevated but not as high as previous [HD]  2247/05/17 Comprehensive metabolic panel(!) No profound derangements in creatinine is better than baseline [HD]  2248 CBC with Differential(!) Only a very mild leukocytosis but no acute anemia [HD]  May 16, 2250 DG Chest Port 1 View Consistent with left lower con consolidation.  Will expand coverage [HD]  05/17/15 Patient much more comfortable at bedside [HD]  05-16-20 Awaiting hospitalist callback.  Patient signed out to oncoming physician in the interim [HD]    Clinical Course User Index [HD] Nicholaus Rolland BRAVO, MD   Data(2/3 categories following were performed): I reviewed or ordered at least three unique tests, external notes, and/or the history required an independent historian as one of the  three requirements as following: At least 3 labs/imaging studies were obtained and/or reviewed. AND  I discussed the management of the patient with the following external physician or qualified healthcare provider: Admitting physician  Risk: This patient has a high risk of morbidity due to further diagnostic testing or treatment. Rationale: Decision made regarding admission  Admit Level 5 - Labs/Rads, Admit, Consult:  Suggested E/M Coding Level: 5, 99285  This level has been selected based on the May 16, 2022 CPT guidelines for E/M codes in the Emergency Department based on 2/3 of the CoPA, Data, and Risk.   FINAL CLINICAL IMPRESSION(S) / ED DIAGNOSES   Final diagnoses:  Flash pulmonary edema (HCC)  HCAP (healthcare-associated pneumonia)  Acute respiratory failure with hypoxia (HCC)     Rx / DC Orders   ED Discharge Orders     None        Note:  This document was prepared using Dragon voice recognition software and may include unintentional dictation errors.   Nicholaus Rolland BRAVO, MD 05/07/24 05/16/21

## 2024-05-08 ENCOUNTER — Encounter: Payer: Self-pay | Admitting: Cardiovascular Disease

## 2024-05-08 DIAGNOSIS — J189 Pneumonia, unspecified organism: Secondary | ICD-10-CM | POA: Diagnosis not present

## 2024-05-08 DIAGNOSIS — J9601 Acute respiratory failure with hypoxia: Secondary | ICD-10-CM | POA: Diagnosis not present

## 2024-05-08 DIAGNOSIS — I5023 Acute on chronic systolic (congestive) heart failure: Secondary | ICD-10-CM | POA: Diagnosis not present

## 2024-05-08 DIAGNOSIS — Z9861 Coronary angioplasty status: Secondary | ICD-10-CM

## 2024-05-08 DIAGNOSIS — I161 Hypertensive emergency: Secondary | ICD-10-CM | POA: Diagnosis not present

## 2024-05-08 LAB — MRSA NEXT GEN BY PCR, NASAL: MRSA by PCR Next Gen: NOT DETECTED

## 2024-05-08 LAB — TROPONIN I (HIGH SENSITIVITY): Troponin I (High Sensitivity): 170 ng/L (ref <18)

## 2024-05-08 LAB — CBC
HCT: 30.8 % — ABNORMAL LOW (ref 36.0–46.0)
Hemoglobin: 10 g/dL — ABNORMAL LOW (ref 12.0–15.0)
MCH: 31.3 pg (ref 26.0–34.0)
MCHC: 32.5 g/dL (ref 30.0–36.0)
MCV: 96.3 fL (ref 80.0–100.0)
Platelets: 177 K/uL (ref 150–400)
RBC: 3.2 MIL/uL — ABNORMAL LOW (ref 3.87–5.11)
RDW: 14.4 % (ref 11.5–15.5)
WBC: 13.1 K/uL — ABNORMAL HIGH (ref 4.0–10.5)
nRBC: 0 % (ref 0.0–0.2)

## 2024-05-08 LAB — BASIC METABOLIC PANEL WITH GFR
Anion gap: 8 (ref 5–15)
BUN: 29 mg/dL — ABNORMAL HIGH (ref 8–23)
CO2: 25 mmol/L (ref 22–32)
Calcium: 8.9 mg/dL (ref 8.9–10.3)
Chloride: 104 mmol/L (ref 98–111)
Creatinine, Ser: 1.4 mg/dL — ABNORMAL HIGH (ref 0.44–1.00)
GFR, Estimated: 36 mL/min — ABNORMAL LOW (ref >60)
Glucose, Bld: 127 mg/dL — ABNORMAL HIGH (ref 70–99)
Potassium: 3.8 mmol/L (ref 3.5–5.1)
Sodium: 137 mmol/L (ref 135–145)

## 2024-05-08 LAB — LACTIC ACID, PLASMA: Lactic Acid, Venous: 1.2 mmol/L (ref 0.5–1.9)

## 2024-05-08 LAB — BLOOD GAS, VENOUS
Bicarbonate: 25.3 mmol/L (ref 20.0–28.0)
Delivery systems: POSITIVE
FIO2: 60 %
O2 Saturation: 56.2 mmol/L (ref 0.0–2.0)
Patient temperature: 37
Patient temperature: 56.2
pCO2, Ven: 39 mmHg — ABNORMAL LOW (ref 44–60)
pH, Ven: 7.42 (ref 7.25–7.43)
pO2, Ven: 31 mmol/L — AB (ref 32–45)

## 2024-05-08 MED ORDER — POTASSIUM CHLORIDE CRYS ER 10 MEQ PO TBCR: 10.0000 meq | EXTENDED_RELEASE_TABLET | Freq: Every day | ORAL | Status: DC | PRN

## 2024-05-08 MED ORDER — SODIUM CHLORIDE 0.9 % IV SOLN: 2.0000 g | INTRAVENOUS | Status: DC

## 2024-05-08 MED ORDER — CARVEDILOL 3.125 MG PO TABS
3.1250 mg | ORAL_TABLET | Freq: Two times a day (BID) | ORAL | Status: DC
  Administered 2024-05-08 – 2024-05-10 (×5): 3.125 mg via ORAL
  Filled 2024-05-08 (×5): qty 1

## 2024-05-08 MED ORDER — FLUOROMETHOLONE 0.1 % OP SUSP
1.0000 [drp] | Freq: Two times a day (BID) | OPHTHALMIC | Status: DC
  Administered 2024-05-09 – 2024-05-11 (×5): 1 [drp] via OPHTHALMIC
  Filled 2024-05-08: qty 5

## 2024-05-08 MED ORDER — FUROSEMIDE 10 MG/ML IJ SOLN
40.0000 mg | Freq: Two times a day (BID) | INTRAMUSCULAR | Status: DC
  Administered 2024-05-08 (×2): 40 mg via INTRAVENOUS
  Filled 2024-05-08 (×2): qty 4

## 2024-05-08 MED ORDER — FUROSEMIDE 10 MG/ML IJ SOLN
20.0000 mg | Freq: Once | INTRAMUSCULAR | Status: AC
  Administered 2024-05-08: 20 mg via INTRAVENOUS
  Filled 2024-05-08: qty 4

## 2024-05-08 MED ORDER — ISOSORBIDE MONONITRATE ER 60 MG PO TB24
60.0000 mg | ORAL_TABLET | Freq: Two times a day (BID) | ORAL | Status: DC
  Administered 2024-05-08 – 2024-05-11 (×7): 60 mg via ORAL
  Filled 2024-05-08 (×8): qty 1

## 2024-05-08 MED ORDER — LOSARTAN POTASSIUM 25 MG PO TABS
25.0000 mg | ORAL_TABLET | Freq: Every day | ORAL | Status: DC
  Administered 2024-05-08 – 2024-05-09 (×2): 25 mg via ORAL
  Filled 2024-05-08 (×2): qty 1

## 2024-05-08 NOTE — ED Notes (Signed)
 This RN messaged attending regarding Hydralazine  100 mg as Pt has been fluctuating in BP of 120-150 when BP yesterday and this AM was 190-200s systolc. Attending told this RN: If she's 120s, I think we can hold. Is she's 150s, then give.

## 2024-05-08 NOTE — Progress Notes (Signed)
 Progress Note   Patient: Stephanie Charles FMW:[id number] DOB: [birth year] DOA: 05/07/2024  DOS: the patient was seen and examined on 05/08/2024   Brief hospital course:  89 y.o. female with medical history significant for LBBB, HTN, hypothyroidism, CKD 3 A CAD s/p stent, HFrEF hospitalized a month ago from 7/6 to 7/10 with flash pulmonary edema (EF 45 to 50%, G1 DD) being admitted with recurrent CHF exacerbation requiring BiPAP and possible pneumonia.  She arrived by EMS from home with shortness of breath and bilateral lower extremity edema.  She is currently on antibiotics for UTI.   Assessment and Plan:  Acute hypoxic respiratory failure - Initially requiring BiPAP to maintain O2 sats.  Subsequently bed weaned down to 2-3 L nasal cannula.  Will attempt to wean O2 as tolerated.  Acute exacerbation of HFrEF - Recent echo noting EF 45-50%.  Chest x-ray personally reviewed noting bilateral congestion.  Responding well to IV Lasix .  Continue to monitor urine output.  Wean O2 as tolerated.  Resume GDMT.  Hypertensive emergency - Likely exacerbated above.  Showing improvement after initiation of diuresis and resumption of home antihypertensive regiment.  Hydralazine  as needed as well.  Elevated troponin with CAD s/p PCI - No complaints of chest pain.  Troponins likely demand ischemia secondary to above.  Aspirin , Plavix , statin, beta-blocker, nitro as needed.  Concern for HCAP - Chest x-ray personally reviewed shows bilateral congestion with possible left-sided lower lobe opacity or effusion.  No purulent sputum, fevers to suggest pneumonia.  Will discontinue cefepime  and linezolid  for now.  Will recheck CBC in AM.  Monitor for fever or purulent sputum with worsening cough. CKD 3A - Appears to be at baseline.  Will recheck BMP in AM.  Physical debilitation muscle weakness - Will order PT/OT consult.  Subjective: Patient resting comfortably this morning.  States she feels improved from yesterday.   Currently only on 2-3 L nasal cannula.  Urine output excellent.  Denies any fever, chills, worsening shortness of breath, purulent sputum, nausea, vomiting, abdominal pain.  Physical Exam:  Vitals:   05/08/24 0800 05/08/24 0900 05/08/24 0930 05/08/24 1000  BP: (!) 126/59 (!) 155/72 (!) 149/70 (!) 141/66  Pulse: 82 87 83 84  Resp: 19 19 19 18   Temp:      TempSrc:      SpO2: 96% 96% 98% 98%  Weight:        GENERAL:  Alert, pleasant, no acute distress  HEENT:  EOMI, nasal cannula CARDIOVASCULAR:  RRR, no murmurs appreciated RESPIRATORY: Poor air movement, basilar crackles GASTROINTESTINAL:  Soft, nontender, nondistended EXTREMITIES: Mild BL LE pitting edema NEURO:  No new focal deficits appreciated SKIN:  No rashes noted PSYCH:  Appropriate mood and affect     Data Reviewed:  Imaging Studies: US  Venous Img Lower Bilateral Result Date: 05/08/2024 CLINICAL DATA:  Lower extremity edema and pain EXAM: Bilateral LOWER EXTREMITY VENOUS DOPPLER ULTRASOUND TECHNIQUE: Gray-scale sonography with compression, as well as color and duplex ultrasound, were performed to evaluate the deep venous system(s) from the level of the common femoral vein through the popliteal and proximal calf veins. COMPARISON:  None Available. FINDINGS: VENOUS Normal compressibility of the common femoral, superficial femoral, and popliteal veins, as well as the visualized calf veins. Visualized portions of profunda femoral vein and great saphenous vein unremarkable. No filling defects to suggest DVT on grayscale or color Doppler imaging. Doppler waveforms show normal direction of venous flow, normal respiratory plasticity and response to augmentation. OTHER None. Limitations: none IMPRESSION:  Negative. Electronically Signed   By: Luke Bun M.D.   On: 05/08/2024 00:22   DG Chest Port 1 View Result Date: 05/07/2024 CLINICAL DATA:  Questionable sepsis - evaluate for abnormality EXAM: PORTABLE CHEST 1 VIEW COMPARISON:  Chest  x-ray 04/09/2024, CT chest 07/28/2023. FINDINGS: The heart and mediastinal contours are within normal limits. Left lower lobe consolidation. Increased coarsened interstitial markings. No pleural effusion. No pneumothorax. No acute osseous abnormality. IMPRESSION: 1. Left lower lobe consolidation. Recommend further evaluation with PA and lateral view of the chest. 2. Increased coarsened interstitial markings. Finding may represent infection versus pulmonary edema. 3.  Aortic Atherosclerosis (ICD10-I70.0). Electronically Signed   By: Morgane  Naveau M.D.   On: 05/07/2024 22:44   DG Chest 2 View Result Date: 04/09/2024 CLINICAL DATA:  Follow-up edema or pneumonia EXAM: CHEST - 2 VIEW COMPARISON:  04/08/2024 01/06/2024 FINDINGS: Cardiomegaly with aortic atherosclerosis. Small bilateral effusions. Decreased edema compared to prior. No focal airspace disease. IMPRESSION: Cardiomegaly with small effusions. Decreased edema compared to prior. Electronically Signed   By: Luke Bun M.D.   On: 04/09/2024 20:22   ECHOCARDIOGRAM COMPLETE Result Date: 04/09/2024    ECHOCARDIOGRAM REPORT   Patient Name:   Stephanie Charles Date of Exam: 04/09/2024 Medical Rec #:  [id number]      Height:       61.0 in Accession #:    [id number]     Weight:       153.7 lb Date of Birth:  09-28-1930     BSA:          1.689 m Patient Age:    89 years       BP:           118/48 mmHg Patient Gender: F              HR:           77 bpm. Exam Location:  [facility] Procedure: 2D Echo, Cardiac Doppler, Color Doppler and Intracardiac            Opacification Agent (Both Spectral and Color Flow Doppler were            utilized during procedure). Indications:     Chest Pain R07.9  History:         Patient has prior history of Echocardiogram examinations, most                  recent 07/29/2023. Signs/Symptoms:Chest Pain.  Sonographer:     Ashley McNeely-Sloane Referring Phys:  [id number] AVA SWAYZE Diagnosing Phys: Cara JONETTA Lovelace MD IMPRESSIONS  1. Left ventricular  ejection fraction, by estimation, is 45 to 50%. The left ventricle has mildly decreased function. The left ventricle demonstrates global hypokinesis. There is moderate concentric left ventricular hypertrophy. Left ventricular diastolic parameters are consistent with Grade I diastolic dysfunction (impaired relaxation).  2. Right ventricular systolic function is normal. The right ventricular size is normal.  3. Left atrial size was mildly dilated.  4. Right atrial size was mildly dilated.  5. The mitral valve is grossly normal. Trivial mitral valve regurgitation.  6. The aortic valve is normal in structure. Aortic valve regurgitation is not visualized. FINDINGS  Left Ventricle: Left ventricular ejection fraction, by estimation, is 45 to 50%. The left ventricle has mildly decreased function. The left ventricle demonstrates global hypokinesis. Definity  contrast agent was given IV to delineate the left ventricular  endocardial borders. Strain was performed and the global longitudinal strain is indeterminate. Global longitudinal strain  performed but not reported based on interpreter judgement due to suboptimal tracking. The left ventricular internal cavity size was  normal in size. There is moderate concentric left ventricular hypertrophy. Left ventricular diastolic parameters are consistent with Grade I diastolic dysfunction (impaired relaxation). Right Ventricle: The right ventricular size is normal. No increase in right ventricular wall thickness. Right ventricular systolic function is normal. Left Atrium: Left atrial size was mildly dilated. Right Atrium: Right atrial size was mildly dilated. Pericardium: There is no evidence of pericardial effusion. Mitral Valve: The mitral valve is grossly normal. Trivial mitral valve regurgitation. MV peak gradient, 4.6 mmHg. The mean mitral valve gradient is 2.0 mmHg. Tricuspid Valve: The tricuspid valve is normal in structure. Tricuspid valve regurgitation is mild. Aortic Valve:  The aortic valve is normal in structure. Aortic valve regurgitation is not visualized. Aortic valve mean gradient measures 5.0 mmHg. Aortic valve peak gradient measures 8.9 mmHg. Aortic valve area, by VTI measures 1.64 cm. Pulmonic Valve: The pulmonic valve was normal in structure. Pulmonic valve regurgitation is trivial. Aorta: The ascending aorta was not well visualized. IAS/Shunts: No atrial level shunt detected by color flow Doppler. Additional Comments: 3D was performed not requiring image post processing on an independent workstation and was indeterminate.  LEFT VENTRICLE PLAX 2D LVIDd:         3.60 cm     Diastology LVIDs:         1.77 cm     LV e' medial:    3.00 cm/s LV PW:         1.94 cm     LV E/e' medial:  20.7 LV IVS:        1.41 cm     LV e' lateral:   6.55 cm/s LVOT diam:     1.90 cm     LV E/e' lateral: 9.5 LV SV:         59 LV SV Index:   35 LVOT Area:     2.84 cm  LV Volumes (MOD) LV vol d, MOD A2C: 64.1 ml LV vol d, MOD A4C: 78.2 ml LV vol s, MOD A2C: 31.5 ml LV vol s, MOD A4C: 37.5 ml LV SV MOD A2C:     32.6 ml LV SV MOD A4C:     78.2 ml LV SV MOD BP:      38.2 ml RIGHT VENTRICLE RV S prime:     8.80 cm/s TAPSE (M-mode): 1.8 cm LEFT ATRIUM              Index        RIGHT ATRIUM           Index LA diam:        4.50 cm  2.66 cm/m   RA Area:     11.80 cm LA Vol (A2C):   110.0 ml 65.14 ml/m  RA Volume:   27.70 ml  16.40 ml/m LA Vol (A4C):   133.0 ml 78.76 ml/m LA Biplane Vol: 128.0 ml 75.80 ml/m  AORTIC VALVE                     PULMONIC VALVE AV Area (Vmax):    1.67 cm      PV Vmax:          1.29 m/s AV Area (Vmean):   1.70 cm      PV Vmean:         86.300 cm/s AV Area (VTI):     1.64 cm  PV VTI:           0.235 m AV Vmax:           149.00 cm/s   PV Peak grad:     6.7 mmHg AV Vmean:          110.000 cm/s  PV Mean grad:     3.0 mmHg AV VTI:            0.362 m       PR End Diast Vel: 3.59 msec AV Peak Grad:      8.9 mmHg      RVOT Peak grad:   4 mmHg AV Mean Grad:      5.0 mmHg LVOT  Vmax:         87.90 cm/s LVOT Vmean:        66.000 cm/s LVOT VTI:          0.209 m LVOT/AV VTI ratio: 0.58  AORTA Ao Root diam: 2.50 cm Ao Asc diam:  1.95 cm MITRAL VALVE MV Area (PHT): 3.45 cm    SHUNTS MV Area VTI:   3.26 cm    Systemic VTI:  0.21 m MV Peak grad:  4.6 mmHg    Systemic Diam: 1.90 cm MV Mean grad:  2.0 mmHg    Pulmonic VTI:  0.149 m MV Vmax:       1.07 m/s MV Vmean:      69.3 cm/s MV Decel Time: 220 msec MV E velocity: 62.10 cm/s MV A velocity: 87.80 cm/s MV E/A ratio:  0.71 Dwayne D Callwood MD Electronically signed by Cara JONETTA Lovelace MD Signature Date/Time: 04/09/2024/4:54:44 PM    Final     There are no new results to review at this time.  Previous records (including but not limited to H&P, progress notes, nursing notes, TOC management) were reviewed in assessment of this patient.  Labs: CBC: Recent Labs  Lab 05/07/24 2205 05/08/24 0515  WBC 13.5* 13.1*  NEUTROABS 11.0*  --   HGB 11.0* 10.0*  HCT 32.9* 30.8*  MCV 95.9 96.3  PLT 177 177   Basic Metabolic Panel: Recent Labs  Lab 05/07/24 2205 05/08/24 0515  NA 137 137  K 3.9 3.8  CL 102 104  CO2 23 25  GLUCOSE 149* 127*  BUN 31* 29*  CREATININE 1.33* 1.40*  CALCIUM  9.2 8.9  MG 1.8  --    Liver Function Tests: Recent Labs  Lab 05/07/24 2205  AST 26  ALT 19  ALKPHOS 104  BILITOT 0.6  PROT 7.3  ALBUMIN  4.1   CBG: No results for input(s): GLUCAP in the last 168 hours.  Scheduled Meds:  amitriptyline   20 mg Oral QHS   aspirin  EC  81 mg Oral Daily   atorvastatin   40 mg Oral Daily   carvedilol   3.125 mg Oral BID WC   clopidogrel   75 mg Oral Daily   enoxaparin  (LOVENOX ) injection  30 mg Subcutaneous Q24H   furosemide   40 mg Intravenous Q12H   hydrALAZINE   100 mg Oral TID   isosorbide  mononitrate  60 mg Oral BID   levothyroxine   137 mcg Oral Q0600   losartan   25 mg Oral Daily   melatonin  10 mg Oral QHS   nitroGLYCERIN   0.5 inch Topical Q6H   pantoprazole   40 mg Oral Daily   sertraline   25  mg Oral Daily   Continuous Infusions:  ceFEPime  (MAXIPIME ) IV     linezolid  (ZYVOX ) IV     PRN  Meds:.acetaminophen  **OR** acetaminophen , albuterol , nitroGLYCERIN , ondansetron  **OR** ondansetron  (ZOFRAN ) IV, potassium chloride   Family Communication: None at bedside  Disposition: Status is: Inpatient Remains inpatient appropriate because: Acute hypoxic respiratory failure, HFrEF exacerbation     Time spent: 40 minutes  Length of inpatient stay: 1 days  Author: Carliss LELON Canales, DO 05/08/2024 11:28 AM  For on call review [email or web address].

## 2024-05-08 NOTE — Hospital Course (Signed)
 89 y.o. female with medical history significant for LBBB, HTN, hypothyroidism, CKD 3 A CAD s/p stent, HFrEF hospitalized a month ago from 7/6 to 7/10 with flash pulmonary edema (EF 45 to 50%, G1 DD) being admitted with recurrent CHF exacerbation requiring BiPAP and possible pneumonia.  She arrived by EMS from home with shortness of breath and bilateral lower extremity edema.  She is currently on antibiotics for UTI.    Assessment and Plan:   Acute hypoxic respiratory failure - Initially requiring BiPAP to maintain O2 sats.  Subsequently bed weaned down to 2-3 L nasal cannula.  Will attempt to wean O2 as tolerated.   Acute exacerbation of HFrEF - Recent echo noting EF 45-50%.  Chest x-ray personally reviewed noting bilateral congestion.  Responding well to IV Lasix .  Continue to monitor urine output.  Wean O2 as tolerated.  Resume GDMT.   Hypertensive emergency - Likely exacerbated above.  Showing improvement after initiation of diuresis and resumption of home antihypertensive regiment.  Hydralazine  as needed as well.   Elevated troponin with CAD s/p PCI - No complaints of chest pain.  Troponins likely demand ischemia secondary to above.  Aspirin , Plavix , statin, beta-blocker, nitro as needed.   Concern for HCAP - Chest x-ray personally reviewed shows bilateral congestion with possible left-sided lower lobe opacity or effusion.  No purulent sputum, fevers to suggest pneumonia.  Will discontinue cefepime  and linezolid  for now.  Will recheck CBC in AM.  Monitor for fever or purulent sputum with worsening cough. CKD 3A - Appears to be at baseline.  Will recheck BMP in AM.   Physical debilitation muscle weakness - Will order PT/OT consult.

## 2024-05-08 NOTE — TOC CM/SW Note (Signed)
..  Transition of Care [facility]) - Inpatient Brief Assessment   Patient Details  Name: Stephanie Charles MRN: [id number] Date of Birth: 11-07-1929  Transition of Care [facility]) CM/SW Contact:    Edsel DELENA Fischer, LCSW Phone Number: 05/08/2024, 10:11 AM   Clinical Narrative:  SW reviewed pt chart.  SW to handoff to Heart Failure Team for eval and review   Transition of Care Asessment:

## 2024-05-08 NOTE — Progress Notes (Signed)
 PHARMACIST - PHYSICIAN COMMUNICATION  CONCERNING:  Enoxaparin  (Lovenox ) for DVT Prophylaxis    RECOMMENDATION: Patient was prescribed enoxaprin 40mg  q24 hours for VTE prophylaxis.   Filed Weights   05/08/24 0009  Weight: 64.4 kg (141 lb 15.6 oz)    Body mass index is 26.83 kg/m.  Estimated Creatinine Clearance: 23.7 mL/min (A) (by C-G formula based on SCr of 1.33 mg/dL (H)).  Patient is candidate for enoxaparin  30mg  every 24 hours based on CrCl <85ml/min or Weight <45kg  DESCRIPTION: Pharmacy has adjusted enoxaparin  dose per [facility] policy.  Patient is now receiving enoxaparin  30 mg every 24 hours   Rankin CANDIE Dills, PharmD, [facility] 05/08/2024 12:11 AM

## 2024-05-08 NOTE — ED Notes (Signed)
 Labs and SST sent down w/Pt labels

## 2024-05-08 NOTE — ED Notes (Addendum)
 Attending notified of BP. States she wants AM scheduled for now. Pharmacy notified to RS meds.

## 2024-05-08 NOTE — ED Notes (Signed)
 Fall precautions in place for Pt. This RN placed fall band, fall grip socks, bed alarm and fall sign.

## 2024-05-09 ENCOUNTER — Telehealth: Payer: Self-pay | Admitting: Cardiovascular Disease

## 2024-05-09 DIAGNOSIS — N1831 Chronic kidney disease, stage 3a: Secondary | ICD-10-CM | POA: Diagnosis not present

## 2024-05-09 DIAGNOSIS — I429 Cardiomyopathy, unspecified: Secondary | ICD-10-CM | POA: Diagnosis not present

## 2024-05-09 DIAGNOSIS — I160 Hypertensive urgency: Secondary | ICD-10-CM

## 2024-05-09 DIAGNOSIS — E785 Hyperlipidemia, unspecified: Secondary | ICD-10-CM

## 2024-05-09 DIAGNOSIS — N178 Other acute kidney failure: Secondary | ICD-10-CM

## 2024-05-09 DIAGNOSIS — I5043 Acute on chronic combined systolic (congestive) and diastolic (congestive) heart failure: Secondary | ICD-10-CM

## 2024-05-09 DIAGNOSIS — I2510 Atherosclerotic heart disease of native coronary artery without angina pectoris: Secondary | ICD-10-CM

## 2024-05-09 DIAGNOSIS — I5023 Acute on chronic systolic (congestive) heart failure: Secondary | ICD-10-CM | POA: Diagnosis not present

## 2024-05-09 DIAGNOSIS — N1832 Chronic kidney disease, stage 3b: Secondary | ICD-10-CM

## 2024-05-09 DIAGNOSIS — I161 Hypertensive emergency: Secondary | ICD-10-CM

## 2024-05-09 LAB — BLOOD CULTURE ID PANEL (REFLEXED) - BCID2

## 2024-05-09 LAB — BASIC METABOLIC PANEL WITH GFR
Anion gap: 10 (ref 5–15)
BUN: 33 mg/dL — ABNORMAL HIGH (ref 8–23)
CO2: 26 mmol/L (ref 22–32)
Calcium: 8.3 mg/dL — ABNORMAL LOW (ref 8.9–10.3)
Chloride: 100 mmol/L (ref 98–111)
Creatinine, Ser: 2.19 mg/dL — ABNORMAL HIGH (ref 0.44–1.00)
GFR, Estimated: 21 mL/min — ABNORMAL LOW (ref >60)
Glucose, Bld: 100 mg/dL — ABNORMAL HIGH (ref 70–99)
Potassium: 3.6 mmol/L (ref 3.5–5.1)
Sodium: 136 mmol/L (ref 135–145)

## 2024-05-09 LAB — CBC
HCT: 25.6 % — ABNORMAL LOW (ref 36.0–46.0)
Hemoglobin: 8.6 g/dL — ABNORMAL LOW (ref 12.0–15.0)
MCH: 32.5 pg (ref 26.0–34.0)
MCHC: 33.6 g/dL (ref 30.0–36.0)
MCV: 96.6 fL (ref 80.0–100.0)
Platelets: 142 K/uL — ABNORMAL LOW (ref 150–400)
RBC: 2.65 MIL/uL — ABNORMAL LOW (ref 3.87–5.11)
RDW: 14.8 % (ref 11.5–15.5)
WBC: 7.7 K/uL (ref 4.0–10.5)
nRBC: 0 % (ref 0.0–0.2)

## 2024-05-09 LAB — FERRITIN: Ferritin: 77 ng/mL (ref 11–307)

## 2024-05-09 LAB — MAGNESIUM: Magnesium: 1.8 mg/dL (ref 1.7–2.4)

## 2024-05-09 LAB — IRON AND TIBC
Iron: 80 ug/dL (ref 28–170)
Saturation Ratios: 37 % — ABNORMAL HIGH (ref 10.4–31.8)
TIBC: 216 ug/dL — ABNORMAL LOW (ref 250–450)
UIBC: 136 ug/dL

## 2024-05-09 LAB — VITAMIN B12: Vitamin B-12: 439 pg/mL (ref 180–914)

## 2024-05-09 LAB — PROCALCITONIN: Procalcitonin: 0.1 ng/mL

## 2024-05-09 MED ORDER — ALBUMIN HUMAN 25 % IV SOLN
25.0000 g | Freq: Once | INTRAVENOUS | Status: AC
  Administered 2024-05-09: 25 g via INTRAVENOUS
  Filled 2024-05-09: qty 100

## 2024-05-09 NOTE — Telephone Encounter (Signed)
 Called and spoke with daughter per DPR and granddaughter. Patient is admitted to [facility] with heart and blood pressure problems. Patient's Entresto  was discontinued 4-5 weeks ago when she was in the hospital. Patient does not understand why it was stopped and feels like she has had increase in heart and blood pressure problems since stopping the medication. Patient would like Dr. Tresia input on why the medication was stopped.

## 2024-05-09 NOTE — Progress Notes (Signed)
  Progress Note   Patient: Stephanie Charles FMW:[id number] DOB: November 09, 1929 DOA: 05/07/2024     2 DOS: the patient was seen and examined on 05/09/2024   Brief hospital course: 89 y.o. female with medical history significant for LBBB, HTN, hypothyroidism, CKD 3 A CAD s/p stent, HFrEF hospitalized a month ago from 7/6 to 7/10 with flash pulmonary edema (EF 45 to 50%, G1 DD) being admitted with recurrent CHF exacerbation requiring BiPAP and possible pneumonia.  She arrived by EMS from home with shortness of breath and bilateral lower extremity edema.  She is currently on antibiotics for UTI.  Upon arrival in the hospital, she has significant hypoxemia requiring BiPAP.  Recent echo showed ejection fraction 45 to 50%, she has evidence of congestive heart failure exacerbation.  She was started on IV Lasix .   Principal Problem:   Acute on chronic HFrEF (heart failure with reduced ejection fraction) (HCC) Active Problems:   Hypertensive emergency   HCAP (healthcare-associated pneumonia)   CAD S/P percutaneous coronary angioplasty   Chronic kidney disease, stage 3a (HCC)   Assessment and Plan: * Acute on chronic HFrEF (heart failure with reduced ejection fraction) (HCC) Hypertensive emergency Acute respiratory failure with hypoxia on BiPAP Pneumonia ruled out. Recent echo with EF 45 to 50% and G1 DD Patient had evidence of volume overload at time of admission.  She was started on IV Lasix .  She feels better today.  Renal function much worse today, hold off additional IV Lasix .  Give a dose of IV albumin .  Acute kidney injury on chronic kidney disease stage IIIa. Renal function much worse off diuretics, creatinine today 2.19.  Will give a dose of albumin .  CAD S/P percutaneous coronary angioplasty Elevated troponin Elevated troponin appears to be secondary to demand ischemia.         Subjective:  Patient feels better today, no additional short of breath.  No cough.  Physical Exam: Vitals:    05/08/24 2315 05/09/24 0348 05/09/24 0500 05/09/24 0810  BP: (!) 109/54 (!) 121/52  (!) 129/58  Pulse: 84 80  74  Resp: 20 18    Temp: 98 F (36.7 C) 97.8 F (36.6 C)  98.9 F (37.2 C)  TempSrc:      SpO2: 98% 98%  95%  Weight: 65.7 kg  67.6 kg   Height: 5' 1.5 (1.562 m)      General exam: Appears calm and comfortable  Respiratory system: Crackles in the bases bilaterally. Respiratory effort normal. Cardiovascular system: S1 & S2 heard, RRR. No JVD, murmurs, rubs, gallops or clicks. No pedal edema. Gastrointestinal system: Abdomen is nondistended, soft and nontender. No organomegaly or masses felt. Normal bowel sounds heard. Central nervous system: Alert and oriented x3. No focal neurological deficits. Extremities: Symmetric 5 x 5 power. Skin: No rashes, lesions or ulcers Psychiatry: Judgement and insight appear normal. Mood & affect appropriate.    Data Reviewed:  Lab results reviewed.  Family Communication: Granddaughter updated over the phone  Disposition: Status is: Inpatient Remains inpatient appropriate because: Severity of disease, IV treatment.     Time spent: 35 minutes  Author: Murvin Mana, MD 05/09/2024 10:52 AM  For on call review [email or web address].

## 2024-05-09 NOTE — Hospital Course (Signed)
 89 y.o. female with medical history significant for LBBB, HTN, hypothyroidism, CKD 3 A CAD s/p stent, HFrEF hospitalized a month ago from 7/6 to 7/10 with flash pulmonary edema (EF 45 to 50%, G1 DD) being admitted with recurrent CHF exacerbation requiring BiPAP and possible pneumonia.  She arrived by EMS from home with shortness of breath and bilateral lower extremity edema.  She is currently on antibiotics for UTI.  Upon arrival in the hospital, she has significant hypoxemia requiring BiPAP.  Recent echo showed ejection fraction 45 to 50%, she has evidence of congestive heart failure exacerbation.  She was started on IV Lasix . Developed acute renal failure after Lasix , she was giving albumin .  Renal function is improving, will continue hold Lasix , patient is medically stable for discharge.

## 2024-05-09 NOTE — Progress Notes (Signed)
 PHARMACY - PHYSICIAN COMMUNICATION CRITICAL VALUE ALERT - BLOOD CULTURE IDENTIFICATION (BCID)  Results for orders placed or performed during the hospital encounter of 05/07/24  Resp panel by RT-PCR (RSV, Flu A&B, Covid) Anterior Nasal Swab     Status: None   Collection Time: 05/07/24 10:05 PM   Specimen: Anterior Nasal Swab  Result Value Ref Range Status   SARS Coronavirus 2 by RT PCR NEGATIVE NEGATIVE Final    Comment: (NOTE) SARS-CoV-2 target nucleic acids are NOT DETECTED.  The SARS-CoV-2 RNA is generally detectable in upper respiratory specimens during the acute phase of infection. The lowest concentration of SARS-CoV-2 viral copies this assay can detect is 138 copies/mL. A negative result does not preclude SARS-Cov-2 infection and should not be used as the sole basis for treatment or other patient management decisions. A negative result may occur with  improper specimen collection/handling, submission of specimen other than nasopharyngeal swab, presence of viral mutation(s) within the areas targeted by this assay, and inadequate number of viral copies(<138 copies/mL). A negative result must be combined with clinical observations, patient history, and epidemiological information. The expected result is Negative.  Fact Sheet for Patients:  BloggerCourse.com  Fact Sheet for Healthcare Providers:  SeriousBroker.it  This test is no t yet approved or cleared by the [place]  FDA and  has been authorized for detection and/or diagnosis of SARS-CoV-2 by FDA under an Emergency Use Authorization (EUA). This EUA will remain  in effect (meaning this test can be used) for the duration of the COVID-19 declaration under Section 564(b)(1) of the Act, 21 U.S.C.section 360bbb-3(b)(1), unless the authorization is terminated  or revoked sooner.       Influenza A by PCR NEGATIVE NEGATIVE Final   Influenza B by PCR NEGATIVE NEGATIVE Final     Comment: (NOTE) The Xpert Xpress SARS-CoV-2/FLU/RSV plus assay is intended as an aid in the diagnosis of influenza from Nasopharyngeal swab specimens and should not be used as a sole basis for treatment. Nasal washings and aspirates are unacceptable for Xpert Xpress SARS-CoV-2/FLU/RSV testing.  Fact Sheet for Patients: BloggerCourse.com  Fact Sheet for Healthcare Providers: SeriousBroker.it  This test is not yet approved or cleared by the [place]  FDA and has been authorized for detection and/or diagnosis of SARS-CoV-2 by FDA under an Emergency Use Authorization (EUA). This EUA will remain in effect (meaning this test can be used) for the duration of the COVID-19 declaration under Section 564(b)(1) of the Act, 21 U.S.C. section 360bbb-3(b)(1), unless the authorization is terminated or revoked.     Resp Syncytial Virus by PCR NEGATIVE NEGATIVE Final    Comment: (NOTE) Fact Sheet for Patients: BloggerCourse.com  Fact Sheet for Healthcare Providers: SeriousBroker.it  This test is not yet approved or cleared by the [place]  FDA and has been authorized for detection and/or diagnosis of SARS-CoV-2 by FDA under an Emergency Use Authorization (EUA). This EUA will remain in effect (meaning this test can be used) for the duration of the COVID-19 declaration under Section 564(b)(1) of the Act, 21 U.S.C. section 360bbb-3(b)(1), unless the authorization is terminated or revoked.  Performed at [facility], [address] [place], [place] [address]   Blood Culture (routine x 2)     Status: None (Preliminary result)   Collection Time: 05/07/24 10:05 PM   Specimen: BLOOD  Result Value Ref Range Status   Specimen Description BLOOD BLOOD RIGHT ARM  Final   Special Requests   Final    BOTTLES DRAWN AEROBIC AND ANAEROBIC Blood  Culture adequate volume   Culture   Final     NO GROWTH < 12 HOURS Performed at [facility], [address] [place], [place] [address]    Report Status PENDING  Incomplete  Blood Culture (routine x 2)     Status: None (Preliminary result)   Collection Time: 05/07/24 10:27 PM   Specimen: BLOOD  Result Value Ref Range Status   Specimen Description   Final    BLOOD BLOOD LEFT ARM Performed at [facility], [address] [place], [place] [address]    Special Requests   Final    BOTTLES DRAWN AEROBIC AND ANAEROBIC Blood Culture adequate volume Performed at [facility], [address] [place], [place] [address]    Culture  Setup Time   Final    GRAM POSITIVE COCCI AEROBIC BOTTLE ONLY Gram Stain Report Called to,Read Back By and Verified With: A. BEVERLY @ AR [id number] [id number], VIRAY,J CRITICAL RESULT CALLED TO, READ BACK BY AND VERIFIED WITH: MAYA RANKIN KATHEE LAVEDA [id number] FCP Performed at [facility], [address] [place], [place] [address]    Culture GRAM POSITIVE COCCI  Final   Report Status PENDING  Incomplete  Blood Culture ID Panel (Reflexed)     Status: Abnormal   Collection Time: 05/07/24 10:27 PM  Result Value Ref Range Status   Enterococcus faecalis NOT DETECTED NOT DETECTED Final   Enterococcus Faecium NOT DETECTED NOT DETECTED Final   Listeria monocytogenes NOT DETECTED NOT DETECTED Final   Staphylococcus species DETECTED (A) NOT DETECTED Final    Comment: CRITICAL RESULT CALLED TO, READ BACK BY AND VERIFIED WITH: PHARMD Dennies Coate B 0515 [id number] FCP    Staphylococcus aureus (BCID) NOT DETECTED NOT DETECTED Final   Staphylococcus epidermidis DETECTED (A) NOT DETECTED Final    Comment: Methicillin (oxacillin) resistant coagulase negative staphylococcus. Possible blood culture contaminant (unless isolated from more than one blood culture draw or clinical case suggests pathogenicity). No antibiotic treatment is indicated for blood  culture contaminants. CRITICAL RESULT CALLED TO, READ  BACK BY AND VERIFIED WITH: PHARMD Rees Santistevan B 0515 [id number] FCP    Staphylococcus lugdunensis NOT DETECTED NOT DETECTED Final   Streptococcus species NOT DETECTED NOT DETECTED Final   Streptococcus agalactiae NOT DETECTED NOT DETECTED Final   Streptococcus pneumoniae NOT DETECTED NOT DETECTED Final   Streptococcus pyogenes NOT DETECTED NOT DETECTED Final   A.calcoaceticus-baumannii NOT DETECTED NOT DETECTED Final   Bacteroides fragilis NOT DETECTED NOT DETECTED Final   Enterobacterales NOT DETECTED NOT DETECTED Final   Enterobacter cloacae complex NOT DETECTED NOT DETECTED Final   Escherichia coli NOT DETECTED NOT DETECTED Final   Klebsiella aerogenes NOT DETECTED NOT DETECTED Final   Klebsiella oxytoca NOT DETECTED NOT DETECTED Final   Klebsiella pneumoniae NOT DETECTED NOT DETECTED Final   Proteus species NOT DETECTED NOT DETECTED Final   Salmonella species NOT DETECTED NOT DETECTED Final   Serratia marcescens NOT DETECTED NOT DETECTED Final   Haemophilus influenzae NOT DETECTED NOT DETECTED Final   Neisseria meningitidis NOT DETECTED NOT DETECTED Final   Pseudomonas aeruginosa NOT DETECTED NOT DETECTED Final   Stenotrophomonas maltophilia NOT DETECTED NOT DETECTED Final   Candida albicans NOT DETECTED NOT DETECTED Final   Candida auris NOT DETECTED NOT DETECTED Final   Candida glabrata NOT DETECTED NOT DETECTED Final   Candida krusei NOT DETECTED NOT DETECTED Final   Candida parapsilosis NOT DETECTED NOT DETECTED Final   Candida tropicalis NOT DETECTED NOT DETECTED Final  Cryptococcus neoformans/gattii NOT DETECTED NOT DETECTED Final   Methicillin resistance mecA/C DETECTED (A) NOT DETECTED Final    Comment: CRITICAL RESULT CALLED TO, READ BACK BY AND VERIFIED WITH: MAYA RANKIN KATHEE LAVEDA [id number] FCP Performed at [facility], [address] [place], [place] [address]   MRSA Next Gen by PCR, Nasal     Status: None   Collection Time: 05/08/24 12:04 PM   Specimen: Nasal Mucosa;  Nasal Swab  Result Value Ref Range Status   MRSA by PCR Next Gen NOT DETECTED NOT DETECTED Final    Comment: (NOTE) The GeneXpert MRSA Assay (FDA approved for NASAL specimens only), is one component of a comprehensive MRSA colonization surveillance program. It is not intended to diagnose MRSA infection nor to guide or monitor treatment for MRSA infections. Test performance is not FDA approved in patients less than 74 years old. Performed at [facility], [address] [place], [place] [address]     BCID Results: 1 (aerobic) of 4 bottles with Staph Epi, mecA/C detected.  Pt did get dose of Zyvox  and Cefepime , but not currently on any abx.  WBC of 7.7 this AM and pt remains afebrile.    Name of provider contacted: HILARIO Solian, MD   Changes to prescribed antibiotics required: Suspected contaminant, no change at this time pending additional lab cx results.  RANKIN CANDIE Dills, PharmD, [facility] 05/09/2024 5:21 AM

## 2024-05-09 NOTE — TOC Initial Note (Signed)
 Transition of Care [facility]) - Initial/Assessment Note    Patient Details  Name: Stephanie Charles MRN: [id number] Date of Birth: 1929-10-12  Transition of [facility]) CM/SW Contact:    Lauraine JAYSON Carpen, LCSW Phone Number: 05/09/2024, 2:54 PM  Clinical Narrative:  Readmission prevention screen complete. CSW met with patient. No family at bedside. CSW introduced role and explained that discharge planning would be discussed. PCP is Charlie Forte, MD. Daughter drives her to appointments. [facility] [address] [employer] [address]. No issues obtaining medications. Patient lives with her daughter, granddaughter, and darra. No home health prior to admission. She is willing to give it a trial run and would like for them to only come out once per week. She will review the CMS list to determine top preference. She declined 3-in-1. Patient reported having Family Planning Medicaid. CSW Patent examiner to see if she can talk to her. Patient asked about having someone come into the home to help her with cleaning, etc. Explained that Medicare does not pay for this and it would be a private pay service. No further concerns. CSW will continue to follow patient for support and facilitate return home once stable. Daughter will transport her home at discharge.                 Expected Discharge Plan: Home w Home Health Services Barriers to Discharge: Continued Medical Work up   Patient Goals and CMS Choice   CMS Medicare.gov Compare Post Acute Care list provided to:: Patient        Expected Discharge Plan and Services     Post Acute Care Choice: Home Health Living arrangements for the past 2 months: Single Family Home                                      Prior Living Arrangements/Services Living arrangements for the past 2 months: Single Family Home Lives with:: Adult Children, Relatives Patient language and need for interpreter reviewed:: Yes Do you feel safe going back to  the place where you live?: Yes      Need for Family Participation in Patient Care: Yes (Comment) Care giver support system in place?: Yes (comment) Current home services: DME Criminal Activity/Legal Involvement Pertinent to Current Situation/Hospitalization: No - Comment as needed  Activities of Daily Living   ADL Screening (condition at time of admission) Independently performs ADLs?: Yes (appropriate for developmental age) Is the patient deaf or have difficulty hearing?: No Does the patient have difficulty seeing, even when wearing glasses/contacts?: No Does the patient have difficulty concentrating, remembering, or making decisions?: No  Permission Sought/Granted Permission sought to share information with : Facility Industrial/product designer granted to share information with : Yes, Verbal Permission Granted     Permission granted to share info w AGENCY: Home health agencies        Emotional Assessment Appearance:: Appears stated age Attitude/Demeanor/Rapport: Engaged, Gracious Affect (typically observed): Accepting, Appropriate, Calm, Pleasant Orientation: : Oriented to Place, Oriented to Self, Oriented to  Time, Oriented to Situation Alcohol / Substance Use: Not Applicable Psych Involvement: No (comment)  Admission diagnosis:  Flash pulmonary edema (HCC) [J81.0] Acute respiratory failure with hypoxia (HCC) [J96.01] HCAP (healthcare-associated pneumonia) [J18.9] Acute on chronic HFrEF (heart failure with reduced ejection fraction) (HCC) [I50.23] Patient Active Problem List   Diagnosis Date Noted   Acute on chronic HFrEF (heart failure with reduced ejection fraction) (HCC)  05/07/2024   HCAP (healthcare-associated pneumonia) 05/07/2024   Chronic kidney disease, stage 3a (HCC) 05/07/2024   Hypertensive emergency 04/10/2024   Flash pulmonary edema (HCC) 04/09/2024   Pulmonary edema cardiac cause (HCC) 04/08/2024   Chest pain 04/08/2024   Acute hypoxic respiratory  failure (HCC) 04/08/2024   Conjunctivitis 04/08/2024   Sepsis (HCC) 04/08/2024   Atypical chest pain 07/30/2023   Demand ischemia (HCC) 07/30/2023   Delirium 07/30/2023   NSTEMI (non-ST elevated myocardial infarction) (HCC) 07/28/2023   Ischemic cardiomyopathy 07/28/2023   HFrEF (heart failure with reduced ejection fraction) (HCC) 07/28/2023   Bilateral leg paresthesia 07/28/2023   Goals of care, counseling/discussion 07/28/2023   Junctional bradycardia 07/28/2023   Knee pain 02/19/2022   Localized, primary osteoarthritis 02/19/2022   Osteoarthritis of knee 02/19/2022   Acute renal failure superimposed on stage 3a chronic kidney disease (HCC) 02/16/2022   Diarrhea 02/16/2022   CHF (congestive heart failure) (HCC) 10/05/2021   Near syncope 09/10/2021   Low back pain 04/09/2021   Lumbar spondylosis 04/09/2021   Hypotension 03/13/2021   C. difficile diarrhea 03/11/2021   Pressure injury of skin 03/11/2021   Shortness of breath    Acute on chronic combined systolic and diastolic CHF (congestive heart failure) (HCC) 03/06/2021   Acute on chronic combined systolic and diastolic congestive heart failure (HCC) 03/06/2021   COVID-19 virus infection 03/06/2021   Hypothyroidism    TIA (transient ischemic attack)    CAD S/P percutaneous coronary angioplasty    Chronic kidney disease, stage 4 (severe) (HCC)    Elevated troponin    Thrombocytopenia (HCC)    Unstable angina (HCC) 06/07/2019   Non-ST elevation (NSTEMI) myocardial infarction [facility])    Angina pectoris (HCC) 06/06/2019   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   Diverticulitis 04/13/2018   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 11/12/2017   Ankle edema, bilateral 05/04/2017   Abnormal blood sugar 03/11/2015   Allergic rhinitis 03/11/2015   Absolute anemia 03/11/2015   Arthropathia 03/11/2015   Essential hypertension 03/11/2015   Benign essential tremor 03/11/2015   Artery disease, cerebral  03/11/2015   Chronic venous insufficiency 03/11/2015   Clinical depression 03/11/2015   Urinary system disease 03/11/2015   Acid reflux 03/11/2015   HLD (hyperlipidemia) 03/11/2015   Adult hypothyroidism 03/11/2015   Irritable colon 03/11/2015   Cannot sleep 03/11/2015   Weak pulse 03/11/2015   Carotid stenosis 03/11/2015   Temporary cerebral vascular dysfunction 03/11/2015   External carotid artery stenosis 07/12/2014   PCP:  Bertrum Charlie CROME, MD Pharmacy:   [facility] [address]), [place] - [address] [place]) [place] [address] Phone: [phone] Fax: [phone]  [facility] - [facility] [address] [place] [address] Phone: [phone] Fax: [phone]     Social Drivers of Health (SDOH) Social History: SDOH Screenings   Food Insecurity: No Food Insecurity (05/08/2024)  Housing: Low Risk  (05/08/2024)  Transportation Needs: No Transportation Needs (05/08/2024)  Utilities: Not At Risk (05/08/2024)  Alcohol Screen: Low Risk  (06/03/2022)  Depression (PHQ2-9): Low Risk  (06/03/2022)  Financial Resource Strain: Medium Risk (04/10/2024)  Physical Activity: Inactive (08/18/2020)  Social Connections: Socially Isolated (05/08/2024)  Stress: No Stress Concern Present (08/18/2020)  Tobacco Use: Low Risk  (05/07/2024)   SDOH Interventions:     Readmission Risk Interventions    05/09/2024    2:53 PM  Readmission Risk Prevention Plan  Transportation Screening Complete  PCP or Specialist  Appt within 3-5 Days Complete  Social Work Consult for Recovery Care Planning/Counseling Complete  Palliative Care Screening Not Applicable  Medication Review Oceanographer) Complete

## 2024-05-09 NOTE — Evaluation (Signed)
 Occupational Therapy Evaluation Patient Details Name: Stephanie Charles MRN: [id number] DOB: Nov 11, 1929 Today's Date: 05/09/2024   History of Present Illness   89 y.o. female with medical history significant for LBBB, HTN, hypothyroidism, CKD 3 A CAD s/p stent, HFrEF hospitalized a month ago from 7/6 to 7/10 with flash pulmonary edema (EF 45 to 50%, G1 DD) being admitted with recurrent CHF exacerbation requiring BiPAP and possible pneumonia.  She arrived by EMS from home with shortness of breath and bilateral lower extremity edema.    Clinical Impressions Stephanie Charles was seen for OT evaluation this date. Prior to hospital admission, pt was MOD I using RW as needed. Pt lives with daughter and granddaughter. Pt currently requires SUPERVISION don underwear. SBA + RW for ADL t/f ~20 ft. MOD I for grooming in sitting. SpO2 91% on RA with activity, 96% at rest on RA - RN notified weaned to RA. Pt would benefit from skilled OT to address noted impairments and functional limitations (see below for any additional details). Upon hospital discharge, recommend no OT follow up.    If plan is discharge home, recommend the following:   Help with stairs or ramp for entrance     Functional Status Assessment   Patient has had a recent decline in their functional status and demonstrates the ability to make significant improvements in function in a reasonable and predictable amount of time.     Equipment Recommendations   BSC/3in1     Recommendations for Other Services         Precautions/Restrictions   Precautions Precautions: Fall Recall of Precautions/Restrictions: Intact Restrictions Weight Bearing Restrictions Per Provider Order: No     Mobility Bed Mobility Overal bed mobility: Modified Independent                  Transfers Overall transfer level: Needs assistance Equipment used: Rolling walker (2 wheels) Transfers: Sit to/from Stand Sit to Stand: Supervision                   Balance Overall balance assessment: Needs assistance Sitting-balance support: No upper extremity supported, Feet supported Sitting balance-Leahy Scale: Normal     Standing balance support: No upper extremity supported, During functional activity Standing balance-Leahy Scale: Good                             ADL either performed or assessed with clinical judgement   ADL Overall ADL's : Needs assistance/impaired                                       General ADL Comments: SUPERVISION don underwear. SBA + RW for ADL t/f ~20 ft. MOD I for grooming in sitting.      Vision         Perception         Praxis         Pertinent Vitals/Pain Pain Assessment Pain Assessment: No/denies pain     Extremity/Trunk Assessment Upper Extremity Assessment Upper Extremity Assessment: Overall WFL for tasks assessed   Lower Extremity Assessment Lower Extremity Assessment: Generalized weakness       Communication Communication Communication: No apparent difficulties   Cognition Arousal: Alert Behavior During Therapy: WFL for tasks assessed/performed Cognition: No apparent impairments             OT - Cognition Comments: sharp  mentation                 Following commands: Intact       Cueing  General Comments      SpO2 91% on RA with activity, 96% at rest on RA - RN notified weaned to RA   Exercises     Shoulder Instructions      Home Living Family/patient expects to be discharged to:: Private residence Living Arrangements: Children Available Help at Discharge: Family;Available 24 hours/day Type of Home: House Home Access: Stairs to enter [employer] of Steps: single threshold step Entrance Stairs-Rails: None Home Layout: Able to live on main level with bedroom/bathroom;Two level     Bathroom Shower/Tub: Runner, broadcasting/film/video: Rollator (4 wheels);Cane - single point;Hand held shower  head;Shower seat   Additional Comments: lives with daughter (retired), granddaughter, and great-grand son (43 yo)      Prior Functioning/Environment Prior Level of Function : Independent/Modified Independent             Mobility Comments: 4WW in AM then no AD ADLs Comments: MOD I ADLs and light housework,  family assisting with some IADLs and transportation    OT Problem List: Decreased activity tolerance;Impaired balance (sitting and/or standing)   OT Treatment/Interventions: Self-care/ADL training;Therapeutic exercise;Energy conservation;DME and/or AE instruction;Therapeutic activities;Patient/family education;Balance training      OT Goals(Current goals can be found in the care plan section)   Acute Rehab OT Goals Patient Stated Goal: to go home OT Goal Formulation: With patient Time For Goal Achievement: 05/23/24 Potential to Achieve Goals: Good ADL Goals Pt Will Perform Grooming: Independently;standing Pt Will Perform Lower Body Dressing: Independently;sit to/from stand Pt Will Transfer to Toilet: Independently;ambulating;regular height toilet   OT Frequency:  Min 1X/week    Co-evaluation              AM-PAC OT 6 Clicks Daily Activity     Outcome Measure Help from another person eating meals?: None Help from another person taking care of personal grooming?: None Help from another person toileting, which includes using toliet, bedpan, or urinal?: A Little Help from another person bathing (including washing, rinsing, drying)?: A Little Help from another person to put on and taking off regular upper body clothing?: None Help from another person to put on and taking off regular lower body clothing?: A Little 6 Click Score: 21   End of Session Equipment Utilized During Treatment: Rolling walker (2 wheels) Nurse Communication: Mobility status  Activity Tolerance: Patient tolerated treatment well Patient left: in chair;with call bell/phone within reach;with  chair alarm set  OT Visit Diagnosis: Other abnormalities of gait and mobility (R26.89);Muscle weakness (generalized) (M62.81)                Time: 9145-9081 OT Time Calculation (min): 24 min Charges:  OT General Charges $OT Visit: 1 Visit OT Evaluation $OT Eval Low Complexity: 1 Low OT Treatments $Self Care/Home Management : 8-22 mins  Stephanie Charles, M.S. OTR/L  05/09/24, 9:35 AM  ascom [phone]

## 2024-05-09 NOTE — Consult Note (Signed)
 Cardiology Consultation   Patient ID: Stephanie Charles MRN: [id number]; DOB: [birth year]  Admit date: 05/07/2024 Date of Consult: 05/09/2024  PCP:  Bertrum Charlie CROME, MD   [facility] Providers Cardiologist:  Evalene Lunger, MD        Patient Profile: Stephanie Charles is a 89 y.o. 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 difficile, CKD stage III, anemia, gout, and anxiety/depression  who is being seen 05/09/2024 for the evaluation of acute on chronic CHF at the request of Dr. Laurita.  History of Present Illness: [name] has history as detailed above. She was recently admitted to [facility] 04/08/2024 with complaints of shortness of breath, chest pain.  Blood pressure was elevated at 200/90.  BNP 359.  Chest x-ray concerning for acute pulmonary edema.  She was started on BiPAP.  Echo during that admission revealed EF 45 to 50% with mild global hypokinesis.  Lasix  was held secondary to renal dysfunction.  Entresto  was held secondary to reported tongue swelling (although none was observed, suspected to be due to BiPAP).  She was discharged 04/12/2024.  She reported to [facility] ED on 8/4 with complaints of shortness of breath and bilateral lower extremity edema.  She denies chest pain, palpitations, lightheadedness, and dizziness.  She was recently treated for UTI with antibiotics per PCP.  In the ED, BP 216/114, HR 113 with O2 saturation 79% improving to 100% on BiPAP.  EKG without acute ischemic changes.  Chest x-ray consistent with pulmonary edema and possible left lower lobe pneumonia.  Pertinent labs include BUN 31, creatinine 1.33, WBC 13.5, Hgb 11, HCT 32.9.  UA negative for UTI.  BNP 584.  Troponin mildly elevated.   She was started on IV Lasix  and admitted for acute on chronic CHF.  She has been diuresed over the past 2 days with IV Lasix  and GDMT has been resumed.  Cardiology was asked to consult for further management of acute on chronic CHF. At time of cardiology consult, patient is resting comfortably in her recliner.  She denies shortness of breath and chest pain.  She overall feels much better since admission.  She reports that she was taking torsemide  as needed at home, but had not felt that she needed it recently.  She is wondering why Entresto  was discontinued at her prior hospitalization.   Past Medical History:  Diagnosis Date   Anemia    C. difficile diarrhea    CHF (congestive heart failure) (HCC) 10/05/2021   Diverticulitis    GERD (gastroesophageal reflux disease)    Hyperlipidemia    Hypertension    Hypothyroidism    Myocardial infarction (HCC) 08/2017   TIA (transient ischemic attack)    1 approx 2015, 1 approx 2018   Vertigo    last episode several months ago   [name]     Past Surgical History:  Procedure Laterality Date   COLONOSCOPY WITH PROPOFOL  N/A 06/22/2018   Procedure: COLONOSCOPY WITH PROPOFOL ;  Surgeon: Therisa Bi, MD;  Location: [facility] ENDOSCOPY;  Service: Endoscopy;  Laterality: N/A;   CORONARY STENT INTERVENTION N/A 06/07/2019   Procedure: CORONARY STENT INTERVENTION;  Surgeon: Darron Deatrice LABOR, MD;  Location: [facility] INVASIVE CV LAB;  Service: Cardiovascular;  Laterality: N/A;   ESOPHAGOGASTRODUODENOSCOPY (EGD) WITH  PROPOFOL  N/A 06/22/2018   Procedure: ESOPHAGOGASTRODUODENOSCOPY (EGD) WITH PROPOFOL ;  Surgeon: Therisa Bi, MD;  Location: [facility] ENDOSCOPY;  Service: Endoscopy;  Laterality: N/A;   KNEE ARTHROSCOPY Right    REPLACEMENT TOTAL KNEE BILATERAL     RIGHT/LEFT HEART CATH AND CORONARY ANGIOGRAPHY N/A 06/07/2019   Procedure: RIGHT/LEFT HEART CATH AND CORONARY ANGIOGRAPHY;  Surgeon: Perla Evalene PARAS, MD;  Location: [facility] INVASIVE CV LAB;  Service:  Cardiovascular;  Laterality: N/A;   SHOULDER SURGERY Left        Scheduled Meds:  amitriptyline   20 mg Oral QHS   aspirin  EC  81 mg Oral Daily   atorvastatin   40 mg Oral Daily   carvedilol   3.125 mg Oral BID WC   clopidogrel   75 mg Oral Daily   enoxaparin  (LOVENOX ) injection  30 mg Subcutaneous Q24H   fluorometholone   1 drop Both Eyes BID   hydrALAZINE   100 mg Oral TID   isosorbide  mononitrate  60 mg Oral BID   levothyroxine   137 mcg Oral Q0600   losartan   25 mg Oral Daily   melatonin  10 mg Oral QHS   pantoprazole   40 mg Oral Daily   sertraline   25 mg Oral Daily   Continuous Infusions:  PRN Meds: acetaminophen  **OR** acetaminophen , albuterol , nitroGLYCERIN , ondansetron  **OR** ondansetron  (ZOFRAN ) IV, potassium chloride   Allergies:    Allergies  Allergen Reactions   Codeine Nausea And Vomiting and Nausea Only   Levofloxacin Other (See Comments)    Mouth sores   Lisinopril Other (See Comments) and Cough    Cough?   Other Itching   Povidone-Iodine Other (See Comments), Rash and Dermatitis    Severe blistering and itchiness. Severe redness   Simvastatin Other (See Comments)    Myalgias   Latex Itching    Social History:   Social History   Socioeconomic History   Marital status: Divorced    Spouse name: na   Number of children: 4   Years of education: 14   Highest education level: Associate degree: occupational, Scientist, product/process development, or vocational program  Occupational History   Occupation: retired  Tobacco Use   Smoking status: Never   Smokeless tobacco: Never  Vaping Use   Vaping status: Never Used  Substance and Sexual Activity   Alcohol use: No    Alcohol/week: 0.0 standard drinks of alcohol   Drug use: No   Sexual activity: Never  Other Topics Concern   Not on file  Social History Narrative   Not on file   Social Drivers of Health   Financial Resource Strain: Medium Risk (04/10/2024)   Overall Financial Resource Strain (CARDIA)    Difficulty of Paying  Living Expenses: Somewhat hard  Food Insecurity: No Food Insecurity (05/08/2024)   Hunger Vital Sign    Worried About Running Out of Food in the Last Year: Never true    Ran Out of Food in the Last Year: Never true  Transportation Needs: No Transportation Needs (05/08/2024)   PRAPARE - Administrator, Civil Service (Medical): No    Lack of Transportation (Non-Medical): No  Physical Activity: Inactive (08/18/2020)   Exercise Vital Sign    Days of Exercise per Week: 0 days    Minutes of Exercise per Session: 0 min  Stress: No Stress Concern Present (08/18/2020)   [employer] of Occupational Health - Occupational Stress Questionnaire    Feeling of Stress : Not at all  Social Connections: Socially Isolated (05/08/2024)   Social Connection and Isolation Panel  Frequency of Communication with Friends and Family: Never    Frequency of Social Gatherings with Friends and Family: More than three times a week    Attends Religious Services: Never    Database administrator or Organizations: No    Attends Banker Meetings: Never    Marital Status: Divorced  Catering manager Violence: Not At Risk (05/08/2024)   Humiliation, Afraid, Rape, and Kick questionnaire    Fear of Current or Ex-Partner: No    Emotionally Abused: No    Physically Abused: No    Sexually Abused: No    Family History:    Family History  Problem Relation Age of Onset   Heart disease Mother    Hypertension Mother    Stroke Mother    Heart disease Father    Breast cancer Sister    Alzheimer's disease Brother    Heart attack Brother    Stroke Brother    Heart disease Brother    Mental illness Brother        died suicide   Cancer Brother        prostate   Heart disease Brother        atrial fib   Alzheimer's disease Brother    Clotting disorder Daughter    Fibromyalgia Daughter      ROS:  Please see the history of present illness.   Physical Exam/Data: Vitals:   05/09/24 0348  05/09/24 0500 05/09/24 0810 05/09/24 1113  BP: (!) 121/52  (!) 129/58 (!) 129/52  Pulse: 80  74 80  Resp: 18     Temp: 97.8 F (36.6 C)  98.9 F (37.2 C) 97.7 F (36.5 C)  TempSrc:      SpO2: 98%  95% 94%  Weight:  67.6 kg    Height:        Intake/Output Summary (Last 24 hours) at 05/09/2024 1301 Last data filed at 05/09/2024 9072 Gross per 24 hour  Intake 120 ml  Output 500 ml  Net -380 ml      05/09/2024    5:00 AM 05/08/2024   11:15 PM 05/08/2024   12:09 AM  Last 3 Weights  Weight (lbs) 149 lb 0.5 oz 144 lb 14.4 oz 141 lb 15.6 oz  Weight (kg) 67.6 kg 65.726 kg 64.4 kg     Body mass index is 27.7 kg/m.  General:  Well nourished, well developed, in no acute distress HEENT: normal Neck: no JVD Vascular: No carotid bruits; Distal pulses 2+ bilaterally Cardiac:  normal S1, S2; RRR; no murmur  Lungs: Crackles at the right base Abd: soft, nontender, no hepatomegaly  Ext: no edema Skin: warm and dry  Psych:  Normal affect   EKG:  The EKG was personally reviewed and demonstrates:  sinus vs junctional tachycardia with LBBB, rate 110 bpm Telemetry:  Telemetry was personally reviewed and demonstrates: sinus rhythm with PVCs, rate 70-100 bpm  Relevant CV Studies:  04/09/2024 Echo complete 1. Left ventricular ejection fraction, by estimation, is 45 to 50%. The  left ventricle has mildly decreased function. The left ventricle  demonstrates global hypokinesis. There is moderate concentric left  ventricular hypertrophy. Left ventricular  diastolic parameters are consistent with Grade I diastolic dysfunction  (impaired relaxation).   2. Right ventricular systolic function is normal. The right ventricular  size is normal.   3. Left atrial size was mildly dilated.   4. Right atrial size was mildly dilated.   5. The mitral valve is grossly normal. Trivial  mitral valve  regurgitation.   6. The aortic valve is normal in structure. Aortic valve regurgitation is  not visualized.    Laboratory Data: High Sensitivity Troponin:   Recent Labs  Lab 05/07/24 2205 05/08/24 0125  TROPONINIHS 89* 170*     Chemistry Recent Labs  Lab 05/07/24 2205 05/08/24 0515 05/09/24 0457  NA 137 137 136  K 3.9 3.8 3.6  CL 102 104 100  CO2 23 25 26   GLUCOSE 149* 127* 100*  BUN 31* 29* 33*  CREATININE 1.33* 1.40* 2.19*  CALCIUM  9.2 8.9 8.3*  MG 1.8  --  1.8  GFRNONAA 38* 36* 21*  ANIONGAP 12 8 10     Recent Labs  Lab 05/07/24 2205  PROT 7.3  ALBUMIN  4.1  AST 26  ALT 19  ALKPHOS 104  BILITOT 0.6   Lipids No results for input(s): CHOL, TRIG, HDL, LABVLDL, LDLCALC, CHOLHDL in the last 168 hours.  Hematology Recent Labs  Lab 05/07/24 2205 05/08/24 0515 05/09/24 0457  WBC 13.5* 13.1* 7.7  RBC 3.43* 3.20* 2.65*  HGB 11.0* 10.0* 8.6*  HCT 32.9* 30.8* 25.6*  MCV 95.9 96.3 96.6  MCH 32.1 31.3 32.5  MCHC 33.4 32.5 33.6  RDW 14.6 14.4 14.8  PLT 177 177 142*   Thyroid  No results for input(s): TSH, FREET4 in the last 168 hours.  BNP Recent Labs  Lab 05/07/24 2205  BNP 585.4*    DDimer No results for input(s): DDIMER in the last 168 hours.  Radiology/Studies:  US  Venous Img Lower Bilateral Result Date: 05/08/2024 IMPRESSION: Negative. Electronically Signed   By: Luke Bun M.D.   On: 05/08/2024 00:22   DG Chest Port 1 View Result Date: 05/07/2024 IMPRESSION: 1. Left lower lobe consolidation. Recommend further evaluation with PA and lateral view of the chest. 2. Increased coarsened interstitial markings. Finding may represent infection versus pulmonary edema. 3.  Aortic Atherosclerosis (ICD10-I70.0). Electronically Signed   By: Morgane  Naveau M.D.   On: 05/07/2024 22:44   Assessment and Plan:  Acute on chronic HFimpEF - Recent hospitalization last month for acute on chronic CHF and flash pulmonary edema -Most recent echo 04/09/2024 showed EF 45 to 50% with global hypokinesis, G1 DD - Presenting this admission with dyspnea and lower  extremity swelling - Received IV Lasix  20 mg x 2, 40 mg x 1 - She appears euvolemic on exam today, creatinine increased from 1.40 yesterday to 2.19 today - Agree with holding Lasix  today - Continue carvedilol  3.125 mg twice daily and losartan  25 mg daily - Would likely benefit from standing diuretic on discharge - Consider transitioning from ARB to ARNI tomorrow if renal function allows - Unable to add SGLT2i and MRA at this time due to renal dysfunction  AKI on CKD IIIa - Creatinine 2.19 today, suspect slight dehydration due to IV diuresis - Hold Lasix  as above - Continue to avoid nephrotoxic agents  CAD Hyperlipidemia - Troponin mildly elevated on admission, not consistent with ACS - No chest pain - EKG without acute ischemic changes - No plan for ischemic evaluation at this time  For questions or updates, please contact [facility] Please consult [email or web address] for contact info under    Signed, Lesley LITTIE Maffucci, PA-C  05/09/2024 1:01 PM

## 2024-05-09 NOTE — Evaluation (Signed)
 Physical Therapy Evaluation Patient Details Name: Stephanie Charles MRN: [id number] DOB: 10/04/1930 Today's Date: 05/09/2024  History of Present Illness  Pt is a 89 y.o. female with medical history significant for LBBB, HTN, hypothyroidism, CKD 3 A CAD s/p stent, HFrEF hospitalized a month ago from 7/6 to 7/10 with flash pulmonary edema (EF 45 to 50%, G1 DD) being admitted with recurrent CHF exacerbation requiring BiPAP and possible pneumonia.  She arrived by EMS from home with shortness of breath and bilateral lower extremity edema.   Clinical Impression  Pt was pleasant and motivated to participate during the session and put forth good effort throughout. Pt required no physical assistance during the session but did present with min lean on the RW for support and a decrease in activity tolerance compared to her stated baseline level of function.  Pt was able to amb 100 feet with no overt LOB but did require cuing for safe sequencing with the RW.  Pt reported no adverse symptoms during the session with SpO2 89-90% during amb and 91% at rest on room air, HR WNL throughout, nsg notified.  Pt will benefit from continued PT services upon discharge to safely address deficits listed in patient problem list for decreased caregiver assistance and eventual return to PLOF.             If plan is discharge home, recommend the following: A little help with walking and/or transfers;A little help with bathing/dressing/bathroom;Assist for transportation;Assistance with cooking/housework   Can travel by private vehicle        Equipment Recommendations None recommended by PT  Recommendations for Other Services       Functional Status Assessment Patient has had a recent decline in their functional status and demonstrates the ability to make significant improvements in function in a reasonable and predictable amount of time.     Precautions / Restrictions Precautions Precautions: Fall Recall of  Precautions/Restrictions: Intact Restrictions Weight Bearing Restrictions Per Provider Order: No      Mobility  Bed Mobility               General bed mobility comments: NT, in recliner pre-post session    Transfers Overall transfer level: Needs assistance Equipment used: Rolling walker (2 wheels) Transfers: Sit to/from Stand Sit to Stand: Supervision           General transfer comment: Good eccentric and concentric control and stability from multiple height surfaces    Ambulation/Gait Ambulation/Gait assistance: Supervision Gait Distance (Feet): 100 Feet x 1, 30 feet x 1 Assistive device: Rolling walker (2 wheels) Gait Pattern/deviations: Step-through pattern, Decreased step length - right, Decreased step length - left, Trunk flexed Gait velocity: decreased     General Gait Details: Mildly reduced cadence and bilateral step length with min trunk flexion but steady with no overt LOB; min verbal cues for staying within the RW during sharp turns  Stairs            Wheelchair Mobility     Tilt Bed    Modified Rankin (Stroke Patients Only)       Balance Overall balance assessment: Needs assistance Sitting-balance support: No upper extremity supported, Feet supported Sitting balance-Leahy Scale: Normal     Standing balance support: During functional activity, Bilateral upper extremity supported Standing balance-Leahy Scale: Good                               Pertinent Vitals/Pain Pain Assessment  Pain Assessment: No/denies pain    Home Living Family/patient expects to be discharged to:: Private residence Living Arrangements: Children Available Help at Discharge: Family;Available 24 hours/day Type of Home: House Home Access: Stairs to enter Entrance Stairs-Rails: None Entrance Stairs-Number of Steps: single threshold step   Home Layout: Able to live on main level with bedroom/bathroom;Two level Home Equipment: Rollator (4  wheels);Cane - single point;Hand held shower head;Shower seat Additional Comments: lives with daughter (retired), granddaughter, and great-grand son (54 yo)    Prior Function Prior Level of Function : Independent/Modified Independent             Mobility Comments: Occasional use of a rollator initially upon waking but typically does not use an AD with amb, able to amb limited community distances, one fall in the last 6 months secondary to slipping in the shower ADLs Comments: MOD I ADLs and light housework,  family assisting with some IADLs and transportation     Extremity/Trunk Assessment   Upper Extremity Assessment Upper Extremity Assessment: Overall WFL for tasks assessed    Lower Extremity Assessment Lower Extremity Assessment: Overall WFL for tasks assessed       Communication   Communication Communication: No apparent difficulties    Cognition Arousal: Alert Behavior During Therapy: WFL for tasks assessed/performed   PT - Cognitive impairments: No apparent impairments                         Following commands: Intact       Cueing Cueing Techniques: Verbal cues     General Comments General comments (skin integrity, edema, etc.): SpO2 91% on RA with activity, 96% at rest on RA - RN notified weaned to RA    Exercises     Assessment/Plan    PT Assessment Patient needs continued PT services  PT Problem List Decreased activity tolerance       PT Treatment Interventions DME instruction;Gait training;Functional mobility training;Therapeutic activities;Therapeutic exercise;Balance training;Patient/family education    PT Goals (Current goals can be found in the Care Plan section)  Acute Rehab PT Goals Patient Stated Goal: To return home PT Goal Formulation: With patient Time For Goal Achievement: 05/22/24 Potential to Achieve Goals: Good    Frequency Min 1X/week     Co-evaluation               AM-PAC PT 6 Clicks Mobility  Outcome  Measure Help needed turning from your back to your side while in a flat bed without using bedrails?: A Little Help needed moving from lying on your back to sitting on the side of a flat bed without using bedrails?: A Little Help needed moving to and from a bed to a chair (including a wheelchair)?: A Little Help needed standing up from a chair using your arms (e.g., wheelchair or bedside chair)?: A Little Help needed to walk in hospital room?: A Little Help needed climbing 3-5 steps with a railing? : A Little 6 Click Score: 18    End of Session Equipment Utilized During Treatment: Gait belt Activity Tolerance: Patient tolerated treatment well Patient left: in chair;with call bell/phone within reach;with chair alarm set;with nursing/sitter in room Nurse Communication: Mobility status PT Visit Diagnosis: Difficulty in walking, not elsewhere classified (R26.2)    Time: 1030-1057 PT Time Calculation (min) (ACUTE ONLY): 27 min   Charges:   PT Evaluation $PT Eval Moderate Complexity: 1 Mod PT Treatments $Therapeutic Activity: 8-22 mins PT General Charges $$ ACUTE PT VISIT:  1 Visit    D. Scott Celvin Taney PT, DPT 05/09/24, 11:43 AM

## 2024-05-09 NOTE — Telephone Encounter (Signed)
 Pts granddaughter requesting a c/b in regards to MyChart message

## 2024-05-09 NOTE — Plan of Care (Signed)
   Problem: Education: Goal: Knowledge of General Education information will improve Description Including pain rating scale, medication(s)/side effects and non-pharmacologic comfort measures Outcome: Progressing

## 2024-05-09 NOTE — Care Management Important Message (Signed)
 Important Message  Patient Details  Name: Stephanie Charles MRN: [id number] Date of Birth: Jun 28, 1930   Important Message Given:  Yes - Medicare IM     [name] 05/09/2024, 12:31 PM

## 2024-05-10 ENCOUNTER — Other Ambulatory Visit: Payer: Self-pay

## 2024-05-10 DIAGNOSIS — I428 Other cardiomyopathies: Secondary | ICD-10-CM | POA: Diagnosis not present

## 2024-05-10 DIAGNOSIS — I2510 Atherosclerotic heart disease of native coronary artery without angina pectoris: Secondary | ICD-10-CM | POA: Diagnosis not present

## 2024-05-10 DIAGNOSIS — I5033 Acute on chronic diastolic (congestive) heart failure: Secondary | ICD-10-CM | POA: Diagnosis not present

## 2024-05-10 DIAGNOSIS — J9601 Acute respiratory failure with hypoxia: Secondary | ICD-10-CM | POA: Diagnosis not present

## 2024-05-10 DIAGNOSIS — I5023 Acute on chronic systolic (congestive) heart failure: Secondary | ICD-10-CM | POA: Diagnosis not present

## 2024-05-10 DIAGNOSIS — N1831 Chronic kidney disease, stage 3a: Secondary | ICD-10-CM | POA: Diagnosis not present

## 2024-05-10 DIAGNOSIS — N178 Other acute kidney failure: Secondary | ICD-10-CM | POA: Diagnosis not present

## 2024-05-10 DIAGNOSIS — N179 Acute kidney failure, unspecified: Secondary | ICD-10-CM

## 2024-05-10 LAB — BASIC METABOLIC PANEL WITH GFR
Anion gap: 12 (ref 5–15)
BUN: 47 mg/dL — ABNORMAL HIGH (ref 8–23)
CO2: 26 mmol/L (ref 22–32)
Calcium: 8.6 mg/dL — ABNORMAL LOW (ref 8.9–10.3)
Chloride: 99 mmol/L (ref 98–111)
Creatinine, Ser: 2.78 mg/dL — ABNORMAL HIGH (ref 0.44–1.00)
GFR, Estimated: 16 mL/min — ABNORMAL LOW (ref >60)
Glucose, Bld: 98 mg/dL (ref 70–99)
Potassium: 3.9 mmol/L (ref 3.5–5.1)
Sodium: 137 mmol/L (ref 135–145)

## 2024-05-10 LAB — MAGNESIUM: Magnesium: 1.8 mg/dL (ref 1.7–2.4)

## 2024-05-10 LAB — CBC
HCT: 24.3 % — ABNORMAL LOW (ref 36.0–46.0)
Hemoglobin: 8.1 g/dL — ABNORMAL LOW (ref 12.0–15.0)
MCH: 32.4 pg (ref 26.0–34.0)
MCHC: 33.3 g/dL (ref 30.0–36.0)
MCV: 97.2 fL (ref 80.0–100.0)
Platelets: 128 K/uL — ABNORMAL LOW (ref 150–400)
RBC: 2.5 MIL/uL — ABNORMAL LOW (ref 3.87–5.11)
RDW: 14.6 % (ref 11.5–15.5)
WBC: 7.6 K/uL (ref 4.0–10.5)
nRBC: 0 % (ref 0.0–0.2)

## 2024-05-10 LAB — CULTURE, BLOOD (ROUTINE X 2): Special Requests: ADEQUATE

## 2024-05-10 MED ORDER — CARVEDILOL 6.25 MG PO TABS
6.2500 mg | ORAL_TABLET | Freq: Two times a day (BID) | ORAL | Status: DC
  Administered 2024-05-10 – 2024-05-11 (×2): 6.25 mg via ORAL
  Filled 2024-05-10 (×2): qty 1

## 2024-05-10 MED ORDER — CARVEDILOL 3.125 MG PO TABS
3.1250 mg | ORAL_TABLET | Freq: Once | ORAL | Status: AC
  Administered 2024-05-10: 3.125 mg via ORAL
  Filled 2024-05-10: qty 1

## 2024-05-10 MED ORDER — POLYETHYLENE GLYCOL 3350 17 G PO PACK
17.0000 g | PACK | Freq: Every day | ORAL | Status: DC
  Filled 2024-05-10 (×2): qty 1

## 2024-05-10 MED ORDER — ALBUMIN HUMAN 25 % IV SOLN
25.0000 g | Freq: Once | INTRAVENOUS | Status: AC
  Administered 2024-05-10: 25 g via INTRAVENOUS
  Filled 2024-05-10: qty 100

## 2024-05-10 NOTE — Progress Notes (Signed)
 Rounding Note   Patient Name: Stephanie Charles Date of Encounter: 05/10/2024  [facility] Cardiologist: Timothy Gollan, MD   Subjective Patient seen on AM rounds. Denies any chest pain or shortness breath. Kidney function continues to remain elevated.  No overnight events recorded.  Patient stated that she did not have a good night and was unable to rest.  Scheduled Meds:  amitriptyline   20 mg Oral QHS   aspirin  EC  81 mg Oral Daily   atorvastatin   40 mg Oral Daily   carvedilol   6.25 mg Oral BID WC   clopidogrel   75 mg Oral Daily   enoxaparin  (LOVENOX ) injection  30 mg Subcutaneous Q24H   fluorometholone   1 drop Both Eyes BID   hydrALAZINE   100 mg Oral TID   isosorbide  mononitrate  60 mg Oral BID   levothyroxine   137 mcg Oral Q0600   melatonin  10 mg Oral QHS   pantoprazole   40 mg Oral Daily   sertraline   25 mg Oral Daily   Continuous Infusions:  PRN Meds: acetaminophen  **OR** acetaminophen , albuterol , nitroGLYCERIN , ondansetron  **OR** ondansetron  (ZOFRAN ) IV   Vital Signs  Vitals:   05/09/24 2355 05/10/24 0408 05/10/24 0500 05/10/24 0732  BP: (!) 135/53 (!) 138/57  (!) 154/60  Pulse: 78 75  75  Resp: 19 (!) 22  16  Temp: 98.4 F (36.9 C) 98.3 F (36.8 C)  98 F (36.7 C)  TempSrc:      SpO2: 95% 96%  91%  Weight:   70.1 kg   Height:        Intake/Output Summary (Last 24 hours) at 05/10/2024 1011 Last data filed at 05/10/2024 0900 Gross per 24 hour  Intake 240 ml  Output --  Net 240 ml      05/10/2024    5:00 AM 05/09/2024    5:00 AM 05/08/2024   11:15 PM  Last 3 Weights  Weight (lbs) 154 lb 8.7 oz 149 lb 0.5 oz 144 lb 14.4 oz  Weight (kg) 70.1 kg 67.6 kg 65.726 kg      Telemetry Sinus with a chronic left bundle branch block- Personally Reviewed  ECG  No new tracings - Personally Reviewed  Physical Exam  GEN: No acute distress.   Neck: No JVD Cardiac: RRR, no murmurs, rubs, or gallops.  Respiratory: Clear with diminished bases to auscultation  bilaterally. Respirations are unlabored on room air.  GI: Soft, nontender, non-distended  MS: No edema; No deformity. Neuro:  Nonfocal  Psych: Normal affect   Labs High Sensitivity Troponin:   Recent Labs  Lab 05/07/24 2205 05/08/24 0125  TROPONINIHS 89* 170*     Chemistry Recent Labs  Lab 05/07/24 2205 05/08/24 0515 05/09/24 0457 05/10/24 0358  NA 137 137 136 137  K 3.9 3.8 3.6 3.9  CL 102 104 100 99  CO2 23 25 26 26   GLUCOSE 149* 127* 100* 98  BUN 31* 29* 33* 47*  CREATININE 1.33* 1.40* 2.19* 2.78*  CALCIUM  9.2 8.9 8.3* 8.6*  MG 1.8  --  1.8 1.8  PROT 7.3  --   --   --   ALBUMIN  4.1  --   --   --   AST 26  --   --   --   ALT 19  --   --   --   ALKPHOS 104  --   --   --   BILITOT 0.6  --   --   --   GFRNONAA 38* 36*  21* 16*  ANIONGAP 12 8 10 12     Lipids No results for input(s): CHOL, TRIG, HDL, LABVLDL, LDLCALC, CHOLHDL in the last 168 hours.  Hematology Recent Labs  Lab 05/08/24 0515 05/09/24 0457 05/10/24 0358  WBC 13.1* 7.7 7.6  RBC 3.20* 2.65* 2.50*  HGB 10.0* 8.6* 8.1*  HCT 30.8* 25.6* 24.3*  MCV 96.3 96.6 97.2  MCH 31.3 32.5 32.4  MCHC 32.5 33.6 33.3  RDW 14.4 14.8 14.6  PLT 177 142* 128*   Thyroid  No results for input(s): TSH, FREET4 in the last 168 hours.  BNP Recent Labs  Lab 05/07/24 2205  BNP 585.4*    DDimer No results for input(s): DDIMER in the last 168 hours.   Radiology  No results found.  Cardiac Studies 04/09/2024 Echo complete 1. Left ventricular ejection fraction, by estimation, is 45 to 50%. The  left ventricle has mildly decreased function. The left ventricle  demonstrates global hypokinesis. There is moderate concentric left  ventricular hypertrophy. Left ventricular  diastolic parameters are consistent with Grade I diastolic dysfunction  (impaired relaxation).   2. Right ventricular systolic function is normal. The right ventricular  size is normal.   3. Left atrial size was mildly dilated.   4.  Right atrial size was mildly dilated.   5. The mitral valve is grossly normal. Trivial mitral valve  regurgitation.   6. The aortic valve is normal in structure. Aortic valve regurgitation is  not visualized.   Patient Profile   89 y.o. female with a past medical history of coronary artery disease with prior NSTEMI (2016) and repeat NSTEMI (06/2019) status post PCI/DES to the LAD, HFpEF secondary to ischemic cardiomyopathy, labile hypertension, syncope felt to be vasovagal, TIA/posterior CVA, chronic left bundle branch block, carotid artery disease, hypothyroidism, diverticulitis, C. difficile, CKD stage III, anemia, gout, and anxiety/depression, who is being seen and evaluated for acute on chronic congestive heart failure.  Assessment & Plan  Acute on chronic HFrEF -Recent hospitalization last month for acute on chronic CHF and flash pulmonary edema -Most recent echocardiogram 04/09/2024 showed an LVEF of 45-50% with global hypokinesis, G1 DD -Presenting this admission with dyspnea and lower extremity swelling -Received IV furosemide  20 mg x 2 doses 40 mg x 1 dose -Continues to appear euvolemic on exam-creatinine continues to climb -No outputs recorded -Continued carvedilol  with increased dosing to 6.25 mg twice daily -Unable to add SGLT2 inhibitor or MRA due to renal dysfunction - ARB's remain on hold due to renal dysfunction -Continued on Imdur  and hydralazine  -Continue to escalate GDMT as tolerated by renal function -Daily weights and I's and O's  AKI on CKD 3a - Serum creatinine 2.78 -Baseline serum creatinine 1.5-1.7 -Continues to climb suspect slight dehydration due to IV diuresis -Furosemide  and ARB on hold -Monitor urine output -Daily BMP -Monitor/trend/replete electrolytes as needed -Continue to avoid nephrotoxic agents  Coronary artery disease -Mildly elevated troponins on admission not consistent with ACS -Continues to deny any chest discomfort -EKG without acute  ischemic changes -no plan for ischemic evaluation at this time - Continued on aspirin  81 mg daily, clopidogrel  75 mg daily, and atorvastatin  40 mg daily - Continued on carvedilol  with the dose increase of 3.125 mg twice daily to 6.25 mg twice daily - Continue on telemetry monitoring - EKG as needed for pain or changes  Labile blood pressures with a history of hypertensive urgency -Blood pressure 145/54 -Continued on hydralazine  and Imdur , and carvedilol  increased to 6.25 mg twice daily today -Patient already  received her initial a.m. carvedilol  dosing 3.25 mg additional dose of 3.25 mg ordered for the a.m. -Vital signs per unit protocol  Hyperlipidemia -LDL 31 - Remains at goal -Continued on atorvastatin  40 mg daily   For questions or updates, please contact [facility] Please consult [email or web address] for contact info under     Signed, Tamari Redwine, NP  05/10/2024, 10:11 AM

## 2024-05-10 NOTE — Progress Notes (Signed)
 Pt with 12 beats of VT, pt asleep and asymptomatic, notified provider. No other concern at the moment. Plan of care continued.

## 2024-05-10 NOTE — Plan of Care (Signed)

## 2024-05-10 NOTE — Progress Notes (Signed)
  Progress Note   Patient: Stephanie Charles FMW:[id number] DOB: 14-Jan-1930 DOA: 05/07/2024     3 DOS: the patient was seen and examined on 05/10/2024   Brief hospital course: 89 y.o. female with medical history significant for LBBB, HTN, hypothyroidism, CKD 3 A CAD s/p stent, HFrEF hospitalized a month ago from 7/6 to 7/10 with flash pulmonary edema (EF 45 to 50%, G1 DD) being admitted with recurrent CHF exacerbation requiring BiPAP and possible pneumonia.  She arrived by EMS from home with shortness of breath and bilateral lower extremity edema.  She is currently on antibiotics for UTI.  Upon arrival in the hospital, she has significant hypoxemia requiring BiPAP.  Recent echo showed ejection fraction 45 to 50%, she has evidence of congestive heart failure exacerbation.  She was started on IV Lasix .   Principal Problem:   Acute on chronic HFrEF (heart failure with reduced ejection fraction) (HCC) Active Problems:   Hypertensive emergency   HCAP (healthcare-associated pneumonia)   CAD S/P percutaneous coronary angioplasty   Thrombocytopenia (HCC)   Acute renal failure superimposed on stage 3a chronic kidney disease (HCC)   Acute respiratory failure with hypoxia (HCC)   Chronic kidney disease, stage 3a (HCC)   Chronic renal impairment, stage 3b (HCC)   Assessment and Plan: * Acute on chronic HFrEF (heart failure with reduced ejection fraction) (HCC) Hypertensive emergency Acute respiratory failure with hypoxia on BiPAP Pneumonia ruled out. Recent echo with EF 45 to 50% and G1 DD Patient had evidence of volume overload at time of admission.  She was started on IV Lasix .  Her condition improved, Lasix  discontinued on 8/6.  However, patient had a worsening renal function.  Received albumin  8/6 and 8/7.  Lasix  on hold.   Acute kidney injury on chronic kidney disease stage IIIa. Renal function continues to be worse today.  Continue to hold Lasix , give additional dose of albumin .   CAD S/P  percutaneous coronary angioplasty Elevated troponin Elevated troponin appears to be secondary to demand ischemia.        Subjective:  Shortness of breath much improved.  No cough.  Physical Exam: Vitals:   05/09/24 2355 05/10/24 0408 05/10/24 0500 05/10/24 0732  BP: (!) 135/53 (!) 138/57  (!) 154/60  Pulse: 78 75  75  Resp: 19 (!) 22  16  Temp: 98.4 F (36.9 C) 98.3 F (36.8 C)  98 F (36.7 C)  TempSrc:      SpO2: 95% 96%  91%  Weight:   70.1 kg   Height:       General exam: Appears calm and comfortable  Respiratory system: A few crackles in the right lower lobe respiratory effort normal. Cardiovascular system: S1 & S2 heard, RRR. No JVD, murmurs, rubs, gallops or clicks. No pedal edema. Gastrointestinal system: Abdomen is nondistended, soft and nontender. No organomegaly or masses felt. Normal bowel sounds heard. Central nervous system: Alert and oriented. No focal neurological deficits. Extremities: Symmetric 5 x 5 power. Skin: No rashes, lesions or ulcers Psychiatry: Judgement and insight appear normal. Mood & affect appropriate.    Data Reviewed:  Lab results reviewed.  Family Communication: Daughter updated at bedside.  Disposition: Status is: Inpatient Remains inpatient appropriate because: Severity of disease, IV treatment.     Time spent: 35 minutes  Author: Murvin Mana, MD 05/10/2024 10:44 AM  For on call review [email or web address].

## 2024-05-10 NOTE — Progress Notes (Signed)
 Heart Failure Navigator Progress Note Patient brought in by [facility] from home with bilateral lower extremity edema/pain and shortness of breath. Had been seen by Heart Failure Navigator and provided Heart Failure Education and scheduled TOC appointment for  04/17/24.  She was discharged on 04/12/2024. Per notes she cancelled New TOC appointment due to illness.  Visited with patient today while hospitalized to reschedule New TOC appointment on 05/17/24 @ 1:30 PM. Patient did not want a sooner follow-up because her daughter is hospitalized right now and provides the transportation for her.   Education Assessment and Provision:  Detailed education and instructions reviewed on heart failure disease management including the following:  Signs and symptoms of Heart Failure When to call the physician Importance of daily weights-encouraged patient to call if her weight on the scale increases 2-3 pounds in a day.  Low sodium diet Fluid restriction Medication management Anticipated future follow-up appointments  Patient education given on each of the above topics.  Patient acknowledges understanding via teach back method and acceptance of all instructions.  HF zone tool magnet given to patient as a reminder.   Patient has scale at home: Yes Patient has pill box at home: Yes    Navigator will sign off at this time.  Charmaine Pines, RN, BSN [facility] Heart Failure Navigator Secure Chat Only

## 2024-05-10 NOTE — TOC Progression Note (Signed)
 Transition of Care [facility]) - Progression Note    Patient Details  Name: Stephanie Charles MRN: [id number] Date of Birth: 1929-11-17  [facility] CM/SW Contact  Lauraine JAYSON Carpen, LCSW Phone Number: 05/10/2024, 3:48 PM  Clinical Narrative:   CSW met with patient and daughter. Top home health preferences are [facility] and [facility]. [facility] accepted referral for PT, RN. Daughter is aware.   Expected Discharge Plan: Home w Home Health Services Barriers to Discharge: Continued Medical Work up               Expected Discharge Plan and Services     Post Acute Care Choice: Home Health Living arrangements for the past 2 months: Single Family Home                                       Social Drivers of Health (SDOH) Interventions SDOH Screenings   Food Insecurity: No Food Insecurity (05/08/2024)  Housing: Low Risk  (05/08/2024)  Transportation Needs: No Transportation Needs (05/08/2024)  Utilities: Not At Risk (05/08/2024)  Alcohol Screen: Low Risk  (06/03/2022)  Depression (PHQ2-9): Low Risk  (06/03/2022)  Financial Resource Strain: Medium Risk (04/10/2024)  Physical Activity: Inactive (08/18/2020)  Social Connections: Socially Isolated (05/08/2024)  Stress: No Stress Concern Present (08/18/2020)  Tobacco Use: Low Risk  (05/07/2024)    Readmission Risk Interventions    05/09/2024    2:53 PM  Readmission Risk Prevention Plan  Transportation Screening Complete  PCP or Specialist Appt within 3-5 Days Complete  Social Work Consult for Recovery Care Planning/Counseling Complete  Palliative Care Screening Not Applicable  Medication Review Oceanographer) Complete

## 2024-05-11 ENCOUNTER — Other Ambulatory Visit: Payer: Self-pay

## 2024-05-11 DIAGNOSIS — I161 Hypertensive emergency: Secondary | ICD-10-CM | POA: Diagnosis not present

## 2024-05-11 DIAGNOSIS — I5023 Acute on chronic systolic (congestive) heart failure: Secondary | ICD-10-CM | POA: Diagnosis not present

## 2024-05-11 DIAGNOSIS — N1831 Chronic kidney disease, stage 3a: Secondary | ICD-10-CM | POA: Diagnosis not present

## 2024-05-11 DIAGNOSIS — N178 Other acute kidney failure: Secondary | ICD-10-CM | POA: Diagnosis not present

## 2024-05-11 LAB — BASIC METABOLIC PANEL WITH GFR
Anion gap: 10 (ref 5–15)
BUN: 47 mg/dL — ABNORMAL HIGH (ref 8–23)
CO2: 27 mmol/L (ref 22–32)
Calcium: 9 mg/dL (ref 8.9–10.3)
Chloride: 101 mmol/L (ref 98–111)
Creatinine, Ser: 2.45 mg/dL — ABNORMAL HIGH (ref 0.44–1.00)
GFR, Estimated: 18 mL/min — ABNORMAL LOW (ref >60)
Glucose, Bld: 107 mg/dL — ABNORMAL HIGH (ref 70–99)
Potassium: 4.5 mmol/L (ref 3.5–5.1)
Sodium: 138 mmol/L (ref 135–145)

## 2024-05-11 LAB — CBC
HCT: 25 % — ABNORMAL LOW (ref 36.0–46.0)
Hemoglobin: 8.3 g/dL — ABNORMAL LOW (ref 12.0–15.0)
MCH: 31.9 pg (ref 26.0–34.0)
MCHC: 33.2 g/dL (ref 30.0–36.0)
MCV: 96.2 fL (ref 80.0–100.0)
Platelets: 125 K/uL — ABNORMAL LOW (ref 150–400)
RBC: 2.6 MIL/uL — ABNORMAL LOW (ref 3.87–5.11)
RDW: 14.6 % (ref 11.5–15.5)
WBC: 8.8 K/uL (ref 4.0–10.5)
nRBC: 0 % (ref 0.0–0.2)

## 2024-05-11 LAB — MAGNESIUM: Magnesium: 1.9 mg/dL (ref 1.7–2.4)

## 2024-05-11 MED ORDER — AMLODIPINE BESYLATE 5 MG PO TABS
5.0000 mg | ORAL_TABLET | Freq: Every day | ORAL | Status: DC
  Administered 2024-05-11: 5 mg via ORAL
  Filled 2024-05-11: qty 1

## 2024-05-11 MED ORDER — AMLODIPINE BESYLATE 5 MG PO TABS
5.0000 mg | ORAL_TABLET | Freq: Every day | ORAL | 0 refills | Status: DC
  Filled 2024-05-11: qty 30, 30d supply, fill #0

## 2024-05-11 NOTE — Discharge Summary (Signed)
 Physician Discharge Summary   Patient: Stephanie Charles MRN: [id number] DOB: [birth year]  Admit date:     05/07/2024  Discharge date: 05/11/24  Discharge Physician: Murvin Mana   PCP: Bertrum Charlie CROME, MD   Recommendations at discharge:   Follow-up with PCP in 1 week. Follow-up with cardiology as scheduled Recheck a BMP in the next office visit.  Discharge Diagnoses: Principal Problem:   Acute on chronic HFrEF (heart failure with reduced ejection fraction) (HCC) Active Problems:   Hypertensive emergency   HCAP (healthcare-associated pneumonia)   CAD S/P percutaneous coronary angioplasty   Thrombocytopenia (HCC)   Acute renal failure superimposed on stage 3a chronic kidney disease (HCC)   Acute respiratory failure with hypoxia (HCC)   Chronic kidney disease, stage 3a (HCC)   Chronic renal impairment, stage 3b (HCC)  Resolved Problems:   * No resolved hospital problems. *  Hospital Course: 89 y.o. female with medical history significant for LBBB, HTN, hypothyroidism, CKD 3 A CAD s/p stent, HFrEF hospitalized a month ago from 7/6 to 7/10 with flash pulmonary edema (EF 45 to 50%, G1 DD) being admitted with recurrent CHF exacerbation requiring BiPAP and possible pneumonia.  She arrived by EMS from home with shortness of breath and bilateral lower extremity edema.  She is currently on antibiotics for UTI.  Upon arrival in the hospital, she has significant hypoxemia requiring BiPAP.  Recent echo showed ejection fraction 45 to 50%, she has evidence of congestive heart failure exacerbation.  She was started on IV Lasix . Developed acute renal failure after Lasix , she was giving albumin .  Renal function is improving, will continue hold Lasix , patient is medically stable for discharge.  Assessment and Plan: * Acute on chronic HFrEF (heart failure with reduced ejection fraction) (HCC) Hypertensive emergency Acute respiratory failure with hypoxia on BiPAP Pneumonia ruled out. Recent echo  with EF 45 to 50% and G1 DD Patient had evidence of volume overload at time of admission.  She was started on IV Lasix .  Her condition improved, Lasix  discontinued on 8/6.  However, patient had a worsening renal function.  Received albumin  8/6 and 8/7.  Renal function improving, no need for additional Lasix .   Acute kidney injury on chronic kidney disease stage IIIa. Renal function worsened after giving Lasix , received albumin , renal function seem to be improving.  Continue to hold ARB.  Essential hypertension. Patient is continued on hydralazine , beta-blocker, Imdur .  ARB on hold due to worsening renal function, I will temporarily replaced it with amlodipine .  Patient can be followed up with cardiology and adjust medications.   CAD S/P percutaneous coronary angioplasty Elevated troponin Elevated troponin appears to be secondary to demand ischemia.           Consultants: Cardiology. Procedures performed: None  Disposition: Home health Diet recommendation:  Discharge Diet Orders (From admission, onward)     Start     Ordered   05/11/24 0000  Diet - low sodium heart healthy        05/11/24 1339           Cardiac diet DISCHARGE MEDICATION: Allergies as of 05/11/2024       Reactions   Codeine Nausea And Vomiting, Nausea Only   Levofloxacin Other (See Comments)   Mouth sores   Lisinopril Other (See Comments), Cough   Cough?   Other Itching   Povidone-iodine Other (See Comments), Rash, Dermatitis   Severe blistering and itchiness. Severe redness   Simvastatin Other (See Comments)   Myalgias  Latex Itching        Medication List     STOP taking these medications    losartan  25 MG tablet Commonly known as: COZAAR    nitrofurantoin (macrocrystal-monohydrate) 100 MG capsule Commonly known as: MACROBID   potassium chloride  10 MEQ tablet Commonly known as: KLOR-CON        TAKE these medications    albuterol  (2.5 MG/3ML) 0.083% nebulizer solution Commonly  known as: PROVENTIL  USE 1 VIAL IN NEBULIZER EVERY 4 HOURS AS NEEDED   allopurinol  300 MG tablet Commonly known as: ZYLOPRIM  Take 300 mg by mouth daily.   amitriptyline  10 MG tablet Commonly known as: ELAVIL  Take 1 tablet (10 mg total) by mouth at bedtime. What changed: how much to take   amLODipine  5 MG tablet Commonly known as: NORVASC  Take 1 tablet (5 mg total) by mouth daily. Start taking on: May 12, 2024   Aspirin  Low Dose 81 MG tablet Generic drug: aspirin  EC NEW PRESCRIPTION REQUEST: TAKE ONE TABLET BY MOUTH EVERY DAY   atorvastatin  40 MG tablet Commonly known as: LIPITOR Take 1 tablet by mouth once daily   carvedilol  3.125 MG tablet Commonly known as: COREG  Take 1 tablet (3.125 mg total) by mouth 2 (two) times daily with a meal.   clopidogrel  75 MG tablet Commonly known as: PLAVIX  Take 1 tablet by mouth once daily   cyclobenzaprine  5 MG tablet Commonly known as: FLEXERIL  Take 5 mg by mouth 3 (three) times daily as needed.   ferrous sulfate  325 (65 FE) MG tablet Take 325 mg by mouth at bedtime.   fluorometholone  0.1 % ophthalmic suspension Commonly known as: FML Place 1 drop into both eyes 2 (two) times daily.   gabapentin  100 MG capsule Commonly known as: NEURONTIN  Take 1 capsule by mouth at bedtime.   hydrALAZINE  100 MG tablet Commonly known as: APRESOLINE  Take 1 tablet (100 mg total) by mouth 3 (three) times daily. TAKE 1 TABLET BY MOUTH THREE TIMES DAILY FOR HIGH BLOOD PRESSURE   isosorbide  mononitrate 60 MG 24 hr tablet Commonly known as: IMDUR  Take 1 tablet (60 mg total) by mouth 2 (two) times daily.   levothyroxine  137 MCG tablet Commonly known as: SYNTHROID  Take 1 tablet (137 mcg total) by mouth daily before breakfast.   Melatonin 10 MG Tabs Take 10 tablets by mouth at bedtime.   mirtazapine  30 MG tablet Commonly known as: REMERON  TAKE 1 TABLET BY MOUTH AT BEDTIME   nitroGLYCERIN  0.4 MG SL tablet Commonly known as: NITROSTAT  Place 1  tablet (0.4 mg total) under the tongue every 5 (five) minutes as needed for chest pain.   pantoprazole  40 MG tablet Commonly known as: PROTONIX  Take 1 tablet (40 mg total) by mouth daily.   sertraline  25 MG tablet Commonly known as: ZOLOFT  Take 1 tablet by mouth once daily        Follow-up Information     [facility]. Go on 05/17/2024.   Specialty: Cardiology Why: Hospital Follow-Up 05/17/24 @ 1:30 PM [facility], [address] Floor Please bring all medications to follow-up appointment Free Valet Parking at the door Contact information: [address] [place]  [address] [phone]        [facility] Follow up.   Specialty: Home Health Services Why: They will follow up with you for your home health needs. Contact information: [address] [place] [address] [phone]  Bertrum Charlie CROME, MD Follow up in 1 week(s).   Specialty: Family Medicine Contact information: [address] [place] [address] [phone]                Discharge Exam: Fredricka Weights   05/09/24 0500 05/10/24 0500 05/11/24 0500  Weight: 67.6 kg 70.1 kg 70.8 kg   General exam: Appears calm and comfortable  Respiratory system: Clear to auscultation. Respiratory effort normal. Cardiovascular system: S1 & S2 heard, RRR. No JVD, murmurs, rubs, gallops or clicks. No pedal edema. Gastrointestinal system: Abdomen is nondistended, soft and nontender. No organomegaly or masses felt. Normal bowel sounds heard. Central nervous system: Alert and oriented. No focal neurological deficits. Extremities: Symmetric 5 x 5 power. Skin: No rashes, lesions or ulcers Psychiatry: Judgement and insight appear normal. Mood & affect appropriate.    Condition at discharge: good  The results of significant diagnostics from this hospitalization (including imaging, microbiology,  ancillary and laboratory) are listed below for reference.   Imaging Studies: US  Venous Img Lower Bilateral Result Date: 05/08/2024 CLINICAL DATA:  Lower extremity edema and pain EXAM: Bilateral LOWER EXTREMITY VENOUS DOPPLER ULTRASOUND TECHNIQUE: Gray-scale sonography with compression, as well as color and duplex ultrasound, were performed to evaluate the deep venous system(s) from the level of the common femoral vein through the popliteal and proximal calf veins. COMPARISON:  None Available. FINDINGS: VENOUS Normal compressibility of the common femoral, superficial femoral, and popliteal veins, as well as the visualized calf veins. Visualized portions of profunda femoral vein and great saphenous vein unremarkable. No filling defects to suggest DVT on grayscale or color Doppler imaging. Doppler waveforms show normal direction of venous flow, normal respiratory plasticity and response to augmentation. OTHER None. Limitations: none IMPRESSION: Negative. Electronically Signed   By: Luke Bun M.D.   On: 05/08/2024 00:22   DG Chest Port 1 View Result Date: 05/07/2024 CLINICAL DATA:  Questionable sepsis - evaluate for abnormality EXAM: PORTABLE CHEST 1 VIEW COMPARISON:  Chest x-ray 04/09/2024, CT chest 07/28/2023. FINDINGS: The heart and mediastinal contours are within normal limits. Left lower lobe consolidation. Increased coarsened interstitial markings. No pleural effusion. No pneumothorax. No acute osseous abnormality. IMPRESSION: 1. Left lower lobe consolidation. Recommend further evaluation with PA and lateral view of the chest. 2. Increased coarsened interstitial markings. Finding may represent infection versus pulmonary edema. 3.  Aortic Atherosclerosis (ICD10-I70.0). Electronically Signed   By: Morgane  Naveau M.D.   On: 05/07/2024 22:44    Microbiology: Results for orders placed or performed during the hospital encounter of 05/07/24  Resp panel by RT-PCR (RSV, Flu A&B, Covid) Anterior Nasal Swab      Status: None   Collection Time: 05/07/24 10:05 PM   Specimen: Anterior Nasal Swab  Result Value Ref Range Status   SARS Coronavirus 2 by RT PCR NEGATIVE NEGATIVE Final    Comment: (NOTE) SARS-CoV-2 target nucleic acids are NOT DETECTED.  The SARS-CoV-2 RNA is generally detectable in upper respiratory specimens during the acute phase of infection. The lowest concentration of SARS-CoV-2 viral copies this assay can detect is 138 copies/mL. A negative result does not preclude SARS-Cov-2 infection and should not be used as the sole basis for treatment or other patient management decisions. A negative result may occur with  improper specimen collection/handling, submission of specimen other than nasopharyngeal swab, presence of viral mutation(s) within the areas targeted by this assay, and inadequate number of viral copies(<138 copies/mL). A negative result must be combined with clinical observations, patient history, and  epidemiological information. The expected result is Negative.  Fact Sheet for Patients:  BloggerCourse.com  Fact Sheet for Healthcare Providers:  SeriousBroker.it  This test is no t yet approved or cleared by the [place]  FDA and  has been authorized for detection and/or diagnosis of SARS-CoV-2 by FDA under an Emergency Use Authorization (EUA). This EUA will remain  in effect (meaning this test can be used) for the duration of the COVID-19 declaration under Section 564(b)(1) of the Act, 21 U.S.C.section 360bbb-3(b)(1), unless the authorization is terminated  or revoked sooner.       Influenza A by PCR NEGATIVE NEGATIVE Final   Influenza B by PCR NEGATIVE NEGATIVE Final    Comment: (NOTE) The Xpert Xpress SARS-CoV-2/FLU/RSV plus assay is intended as an aid in the diagnosis of influenza from Nasopharyngeal swab specimens and should not be used as a sole basis for treatment. Nasal washings and aspirates are  unacceptable for Xpert Xpress SARS-CoV-2/FLU/RSV testing.  Fact Sheet for Patients: BloggerCourse.com  Fact Sheet for Healthcare Providers: SeriousBroker.it  This test is not yet approved or cleared by the [place]  FDA and has been authorized for detection and/or diagnosis of SARS-CoV-2 by FDA under an Emergency Use Authorization (EUA). This EUA will remain in effect (meaning this test can be used) for the duration of the COVID-19 declaration under Section 564(b)(1) of the Act, 21 U.S.C. section 360bbb-3(b)(1), unless the authorization is terminated or revoked.     Resp Syncytial Virus by PCR NEGATIVE NEGATIVE Final    Comment: (NOTE) Fact Sheet for Patients: BloggerCourse.com  Fact Sheet for Healthcare Providers: SeriousBroker.it  This test is not yet approved or cleared by the [place]  FDA and has been authorized for detection and/or diagnosis of SARS-CoV-2 by FDA under an Emergency Use Authorization (EUA). This EUA will remain in effect (meaning this test can be used) for the duration of the COVID-19 declaration under Section 564(b)(1) of the Act, 21 U.S.C. section 360bbb-3(b)(1), unless the authorization is terminated or revoked.  Performed at [facility], [address] [place], [place] [address]   Blood Culture (routine x 2)     Status: None (Preliminary result)   Collection Time: 05/07/24 10:05 PM   Specimen: BLOOD  Result Value Ref Range Status   Specimen Description BLOOD BLOOD RIGHT ARM  Final   Special Requests   Final    BOTTLES DRAWN AEROBIC AND ANAEROBIC Blood Culture adequate volume   Culture   Final    NO GROWTH 4 DAYS Performed at [facility], [address], [place], [place] [address]    Report Status PENDING  Incomplete  Blood Culture (routine x 2)     Status: Abnormal   Collection Time: 05/07/24 10:27 PM   Specimen:  BLOOD  Result Value Ref Range Status   Specimen Description   Final    BLOOD BLOOD LEFT ARM Performed at [facility], [address], [place], [place] [address]    Special Requests   Final    BOTTLES DRAWN AEROBIC AND ANAEROBIC Blood Culture adequate volume Performed at [facility], [address] [place], [place] [address]    Culture  Setup Time   Final    GRAM POSITIVE COCCI AEROBIC BOTTLE ONLY Gram Stain Report Called to,Read Back By and Verified With: A. BEVERLY @ [place] [id number], VIRAY,J CRITICAL RESULT CALLED TO, READ BACK BY AND VERIFIED WITH: MAYA LOT B 0515 [id number] FCP    Culture (A)  Final    STAPHYLOCOCCUS  HOMINIS STAPHYLOCOCCUS EPIDERMIDIS THE SIGNIFICANCE OF ISOLATING THIS ORGANISM FROM A SINGLE SET OF BLOOD CULTURES WHEN MULTIPLE SETS ARE DRAWN IS UNCERTAIN. PLEASE NOTIFY THE MICROBIOLOGY DEPARTMENT WITHIN ONE WEEK IF SPECIATION AND SENSITIVITIES ARE REQUIRED. Performed at [facility], [address] [place], [place] [address]    Report Status 05/10/2024 FINAL  Final  Blood Culture ID Panel (Reflexed)     Status: Abnormal   Collection Time: 05/07/24 10:27 PM  Result Value Ref Range Status   Enterococcus faecalis NOT DETECTED NOT DETECTED Final   Enterococcus Faecium NOT DETECTED NOT DETECTED Final   Listeria monocytogenes NOT DETECTED NOT DETECTED Final   Staphylococcus species DETECTED (A) NOT DETECTED Final    Comment: CRITICAL RESULT CALLED TO, READ BACK BY AND VERIFIED WITH: PHARMD NATHAN B 0515 [id number] FCP    Staphylococcus aureus (BCID) NOT DETECTED NOT DETECTED Final   Staphylococcus epidermidis DETECTED (A) NOT DETECTED Final    Comment: Methicillin (oxacillin) resistant coagulase negative staphylococcus. Possible blood culture contaminant (unless isolated from more than one blood culture draw or clinical case suggests pathogenicity). No antibiotic treatment is indicated for blood  culture contaminants. CRITICAL RESULT CALLED  TO, READ BACK BY AND VERIFIED WITH: PHARMD NATHAN B 0515 [id number] FCP    Staphylococcus lugdunensis NOT DETECTED NOT DETECTED Final   Streptococcus species NOT DETECTED NOT DETECTED Final   Streptococcus agalactiae NOT DETECTED NOT DETECTED Final   Streptococcus pneumoniae NOT DETECTED NOT DETECTED Final   Streptococcus pyogenes NOT DETECTED NOT DETECTED Final   A.calcoaceticus-baumannii NOT DETECTED NOT DETECTED Final   Bacteroides fragilis NOT DETECTED NOT DETECTED Final   Enterobacterales NOT DETECTED NOT DETECTED Final   Enterobacter cloacae complex NOT DETECTED NOT DETECTED Final   Escherichia coli NOT DETECTED NOT DETECTED Final   Klebsiella aerogenes NOT DETECTED NOT DETECTED Final   Klebsiella oxytoca NOT DETECTED NOT DETECTED Final   Klebsiella pneumoniae NOT DETECTED NOT DETECTED Final   Proteus species NOT DETECTED NOT DETECTED Final   Salmonella species NOT DETECTED NOT DETECTED Final   Serratia marcescens NOT DETECTED NOT DETECTED Final   Haemophilus influenzae NOT DETECTED NOT DETECTED Final   Neisseria meningitidis NOT DETECTED NOT DETECTED Final   Pseudomonas aeruginosa NOT DETECTED NOT DETECTED Final   Stenotrophomonas maltophilia NOT DETECTED NOT DETECTED Final   Candida albicans NOT DETECTED NOT DETECTED Final   Candida auris NOT DETECTED NOT DETECTED Final   Candida glabrata NOT DETECTED NOT DETECTED Final   Candida krusei NOT DETECTED NOT DETECTED Final   Candida parapsilosis NOT DETECTED NOT DETECTED Final   Candida tropicalis NOT DETECTED NOT DETECTED Final   Cryptococcus neoformans/gattii NOT DETECTED NOT DETECTED Final   Methicillin resistance mecA/C DETECTED (A) NOT DETECTED Final    Comment: CRITICAL RESULT CALLED TO, READ BACK BY AND VERIFIED WITH: MAYA RANKIN KATHEE LAVEDA [id number] FCP Performed at [facility], [address], [place], [place] [address]   MRSA Next Gen by PCR, Nasal     Status: None   Collection Time: 05/08/24 12:04 PM   Specimen: Nasal  Mucosa; Nasal Swab  Result Value Ref Range Status   MRSA by PCR Next Gen NOT DETECTED NOT DETECTED Final    Comment: (NOTE) The GeneXpert MRSA Assay (FDA approved for NASAL specimens only), is one component of a comprehensive MRSA colonization surveillance program. It is not intended to diagnose MRSA infection nor to guide or monitor treatment for MRSA infections. Test performance is not FDA approved in patients less than  69 years old. Performed at [facility], [address] [place], [place] [address]     Labs: CBC: Recent Labs  Lab 05/07/24 2205 05/08/24 0515 05/09/24 0457 05/10/24 0358 05/11/24 0523  WBC 13.5* 13.1* 7.7 7.6 8.8  NEUTROABS 11.0*  --   --   --   --   HGB 11.0* 10.0* 8.6* 8.1* 8.3*  HCT 32.9* 30.8* 25.6* 24.3* 25.0*  MCV 95.9 96.3 96.6 97.2 96.2  PLT 177 177 142* 128* 125*   Basic Metabolic Panel: Recent Labs  Lab 05/07/24 2205 05/08/24 0515 05/09/24 0457 05/10/24 0358 05/11/24 0523  NA 137 137 136 137 138  K 3.9 3.8 3.6 3.9 4.5  CL 102 104 100 99 101  CO2 23 25 26 26 27   GLUCOSE 149* 127* 100* 98 107*  BUN 31* 29* 33* 47* 47*  CREATININE 1.33* 1.40* 2.19* 2.78* 2.45*  CALCIUM  9.2 8.9 8.3* 8.6* 9.0  MG 1.8  --  1.8 1.8 1.9   Liver Function Tests: Recent Labs  Lab 05/07/24 2205  AST 26  ALT 19  ALKPHOS 104  BILITOT 0.6  PROT 7.3  ALBUMIN  4.1   CBG: No results for input(s): GLUCAP in the last 168 hours.  Discharge time spent: greater than 30 minutes.  Signed: Murvin Mana, MD [facility] 05/11/2024

## 2024-05-11 NOTE — Telephone Encounter (Signed)
 See MyChart encounter

## 2024-05-11 NOTE — Plan of Care (Signed)

## 2024-05-12 LAB — CULTURE, BLOOD (ROUTINE X 2)
Culture: NO GROWTH
Special Requests: ADEQUATE

## 2024-05-12 NOTE — TOC CM/SW Note (Signed)
..  Transition of Care [facility]) - Inpatient Brief Assessment   Patient Details  Name: [name] MRN: [id number] Date of Birth: March 11, 1930  [facility]) CM/SW Contact:    Edsel DELENA Fischer, LCSW Phone Number: 05/12/2024, 9:08 AM   Clinical Narrative:  SW submitted referral to [facility] for PT, RN  Transition of Care Asessment:

## 2024-05-15 ENCOUNTER — Encounter: Admitting: Family

## 2024-05-16 ENCOUNTER — Telehealth: Payer: Self-pay | Admitting: Family

## 2024-05-16 NOTE — Progress Notes (Signed)
 [facility] Note   Referring Physician: 07/25 admission PCP: Stephanie Charlie CROME, MD Cardiologist: Stephanie Lunger, MD   Chief Complaint: shortness of breath   HPI:  [name] is a 89 y/o female with a history of LBBB, TIA/CVA in 2016, CKD stage II, HTN, HLD, anemia, carotid artery disease, NSTEMI 06/2019 (PCI/DES to the LAD), NSTEMI 2016, hypothyroidism, PAD, anxiety, depression and chronic heart failure.    Admitted to [facility] in 06/2019 with NSTEMI and HTN. Echo 06/06/2019 showed LVEF 35-40%. Frequenct PVCs noted. R/L heart cath 06/07/2019 showed mid LAD 95% stenosis s/p successful PCI/DES, D2 30% stenosis. RHC showed mean RA pressure of 8, RV pressure 32/7 with a mean of 9, PA pressure 31/16 with a mean of 21, PCWP 10, CO/CI 3.57/2.02.   Admitted 07/2023 with chest pain that woke her up from sleep with elevated blood pressure.  She was treated with IV heparin  x 48 hours.  Echo showed EF of 55 to 60%.  She was restarted on Entresto .  She was felt to be a poor cardiac cath candidate. VQ scan showed no PE.   Went to the ER 01/06/24 for HTN and nausea. At home BP was 213/102. BP in the ER 172/80. HS trop 69>67.Labs were stable. CXR and CT head were reassuring. She was discharged home.   Admitted 04/08/24 with chest pain and shortness of breath. Chest x-ray showed acute pulmonary edema. BNP 359, troponin 468. She is diagnosed with acute congestive heart failure, started on IV Lasix  daily, but this had to be held due to worsening renal function. Echo 04/09/24: EF 45-50% with moderate LVH, G1DD, normal RV. Weaned off oxygen . Entresto  / diuretics held at discharge due to renal function. Losartan  was added.   Admitted 05/07/24 with shortness of breath and bilateral pedal edema due to HF exacerbation initially needing bipap. On antibiotics due to UTI. IV diuresed and then developed AKI. Given albumin , lasix  held and renal function improved. Elevated troponin thought to be due to demand  ischemia.  She presents today for her initial HF visit with a chief complaint of shortness of breath with exertion. Stable since discharge. Has associated fatigue, pedal edema with R.L. Takes her torsemide / potassium as needed but hasn't had to take it since her recent discharge. Weighing daily. Checking BP daily at home and stays 140's/50's. Weight at home within 4-5 pounds.    Previous cardiac studies: Echo 09/18/2019: EF 40 to 45% Echo 03/07/21: EF 50-55%, normal RV, moderate LVH Echo 10/07/21: EF 40% Echo 07/29/23: EF 55 to 60%   Review of Systems: [y] = yes, [ ]  = no   General: Weight gain [ ] ; Weight loss [ ] ; Anorexia [ ] ; Fatigue Stephanie Charles ]; Fever [ ] ; Chills [ ] ; Weakness [ ]   Cardiac: Chest pain/pressure [ ] ; Resting SOB [ ] ; Exertional SOB Stephanie Charles ]; Orthopnea [ ] ; Pedal Edema Stephanie Charles ]; Palpitations [ ] ; Syncope [ ] ; Presyncope [ ] ; Paroxysmal nocturnal dyspnea[ ]   Pulmonary: Cough [ ] ; Wheezing[ ] ; Hemoptysis[ ] ; Sputum [ ] ; Snoring [ ]   GI: Vomiting[ ] ; Dysphagia[ ] ; Melena[ ] ; Hematochezia [ ] ; Heartburn[ ] ; Abdominal pain [ ] ; Constipation [ ] ; Diarrhea [ ] ; BRBPR [ ]   GU: Hematuria[ ] ; Dysuria [ ] ; Nocturia[ ]   Vascular: Pain in legs with walking [ ] ; Pain in feet with lying flat [ ] ; Non-healing sores [ ] ; Stroke [ ] ; TIA [ ] ; Slurred speech [ ] ;  Neuro: Headaches[ ] ; Vertigo[ ] ; Seizures[ ] ; Paresthesias[ ] ;Blurred vision [ ] ;  Diplopia [ ] ; Vision changes [ ]   Ortho/Skin: Arthritis [ ] ; Joint pain [ ] ; Muscle pain [ ] ; Joint swelling [ ] ; Back Pain [ ] ; Rash [ ]   Psych: Depression[ ] ; Anxiety[y ]  Heme: Bleeding problems [ ] ; Clotting disorders [ ] ; Anemia Stephanie Charles ]  Endocrine: Diabetes [ ] ; Thyroid  dysfunction[y ]   Past Medical History:  Diagnosis Date   Anemia    C. difficile diarrhea    CHF (congestive heart failure) (HCC) 10/05/2021   Diverticulitis    GERD (gastroesophageal reflux disease)    Hyperlipidemia    Hypertension    Hypothyroidism    Myocardial infarction (HCC) 08/2017    TIA (transient ischemic attack)    1 approx 2015, 1 approx 2018   Vertigo    last episode several months ago   [name]     Current Outpatient Medications  Medication Sig Dispense Refill   albuterol  (PROVENTIL ) (2.5 MG/3ML) 0.083% nebulizer solution USE 1 VIAL IN NEBULIZER EVERY 4 HOURS AS NEEDED (Patient not taking: Reported on 05/08/2024) 180 mL 6   allopurinol  (ZYLOPRIM ) 300 MG tablet Take 300 mg by mouth daily.     amitriptyline  (ELAVIL ) 10 MG tablet Take 1 tablet (10 mg total) by mouth at bedtime. (Patient taking differently: Take 20 mg by mouth at bedtime.) 90 tablet 3   amLODipine  (NORVASC ) 5 MG tablet Take 1 tablet (5 mg total) by mouth daily. 30 tablet 0   ASPIRIN  LOW DOSE 81 MG EC tablet NEW PRESCRIPTION REQUEST: TAKE ONE TABLET BY MOUTH EVERY DAY 90 tablet 3   atorvastatin  (LIPITOR) 40 MG tablet Take 1 tablet by mouth once daily 90 tablet 3   carvedilol  (COREG ) 3.125 MG tablet Take 1 tablet (3.125 mg total) by mouth 2 (two) times daily with a meal. 180 tablet 3   clopidogrel  (PLAVIX ) 75 MG tablet Take 1 tablet by mouth once daily 90 tablet 3   cyclobenzaprine  (FLEXERIL ) 5 MG tablet Take 5 mg by mouth 3 (three) times daily as needed.     ferrous sulfate  325 (65 FE) MG tablet Take 325 mg by mouth at bedtime.     fluorometholone  (FML) 0.1 % ophthalmic suspension Place 1 drop into both eyes 2 (two) times daily.     gabapentin  (NEURONTIN ) 100 MG capsule Take 1 capsule by mouth at bedtime.     hydrALAZINE  (APRESOLINE ) 100 MG tablet Take 1 tablet (100 mg total) by mouth 3 (three) times daily. TAKE 1 TABLET BY MOUTH THREE TIMES DAILY FOR HIGH BLOOD PRESSURE 270 tablet 1   isosorbide  mononitrate (IMDUR ) 60 MG 24 hr tablet Take 1 tablet (60 mg total) by mouth 2 (two) times daily. 180 tablet 3   levothyroxine  (SYNTHROID ) 137 MCG tablet Take 1 tablet (137 mcg total) by mouth daily before breakfast. 90 tablet 3   Melatonin 10 MG TABS Take 10 tablets by mouth at bedtime.      mirtazapine  (REMERON ) 30 MG tablet TAKE 1 TABLET BY MOUTH AT BEDTIME 90 tablet 3   nitroGLYCERIN  (NITROSTAT ) 0.4 MG SL tablet Place 1 tablet (0.4 mg total) under the tongue every 5 (five) minutes as needed for chest pain. 25 tablet 3   pantoprazole  (PROTONIX ) 40 MG tablet Take 1 tablet (40 mg total) by mouth daily. 90 tablet 3   sertraline  (ZOLOFT ) 25 MG tablet Take 1 tablet by mouth once daily 90 tablet 3   No current facility-administered medications for this visit.    Allergies  Allergen  Reactions   Codeine Nausea And Vomiting and Nausea Only   Levofloxacin Other (See Comments)    Mouth sores   Lisinopril Other (See Comments) and Cough    Cough?   Other Itching   Povidone-Iodine Other (See Comments), Rash and Dermatitis    Severe blistering and itchiness. Severe redness   Simvastatin Other (See Comments)    Myalgias   Latex Itching      Social History   Socioeconomic History   Marital status: Divorced    Spouse name: na   Number of children: 4   Years of education: 14   Highest education level: Associate degree: occupational, Scientist, product/process development, or vocational program  Occupational History   Occupation: retired  Tobacco Use   Smoking status: Never   Smokeless tobacco: Never  Vaping Use   Vaping status: Never Used  Substance and Sexual Activity   Alcohol use: No    Alcohol/week: 0.0 standard drinks of alcohol   Drug use: No   Sexual activity: Never  Other Topics Concern   Not on file  Social History Narrative   Not on file   Social Drivers of Health   Financial Resource Strain: Medium Risk (04/10/2024)   Overall Financial Resource Strain (CARDIA)    Difficulty of Paying Living Expenses: Somewhat hard  Food Insecurity: No Food Insecurity (05/08/2024)   Hunger Vital Sign    Worried About Running Out of Food in the Last Year: Never true    Ran Out of Food in the Last Year: Never true  Transportation Needs: No Transportation Needs (05/08/2024)   PRAPARE - Therapist, art (Medical): No    Lack of Transportation (Non-Medical): No  Physical Activity: Inactive (08/18/2020)   Exercise Vital Sign    Days of Exercise per Week: 0 days    Minutes of Exercise per Session: 0 min  Stress: No Stress Concern Present (08/18/2020)   [employer] of Occupational Health - Occupational Stress Questionnaire    Feeling of Stress : Not at all  Social Connections: Socially Isolated (05/08/2024)   Social Connection and Isolation Panel    Frequency of Communication with Friends and Family: Never    Frequency of Social Gatherings with Friends and Family: More than three times a week    Attends Religious Services: Never    Database administrator or Organizations: No    Attends Banker Meetings: Never    Marital Status: Divorced  Catering manager Violence: Not At Risk (05/08/2024)   Humiliation, Afraid, Rape, and Kick questionnaire    Fear of Current or Ex-Partner: No    Emotionally Abused: No    Physically Abused: No    Sexually Abused: No      Family History  Problem Relation Age of Onset   Heart disease Mother    Hypertension Mother    Stroke Mother    Heart disease Father    Breast cancer Sister    Alzheimer's disease Brother    Heart attack Brother    Stroke Brother    Heart disease Brother    Mental illness Brother        died suicide   Cancer Brother        prostate   Heart disease Brother        atrial fib   Alzheimer's disease Brother    Clotting disorder Daughter    Fibromyalgia Daughter    Vitals:   05/17/24 1349  BP: (!) 160/67  Pulse: 81  SpO2: 95%  Weight: 147 lb (66.7 kg)   Wt Readings from Last 3 Encounters:  05/17/24 147 lb (66.7 kg)  05/11/24 156 lb 1.4 oz (70.8 kg)  04/12/24 148 lb 2.4 oz (67.2 kg)   Lab Results  Component Value Date   CREATININE 2.45 (H) 05/11/2024   CREATININE 2.78 (H) 05/10/2024   CREATININE 2.19 (H) 05/09/2024    PHYSICAL EXAM:  General: Well appearing.  Cor: No  JVD. Regular rhythm, rate.  Lungs: clear Abdomen: soft, nontender, nondistended. Extremities: 1+ pitting edema around bilateral ankles with R>L Neuro:. Affect pleasant   ECG: NSR/ LBBB with HR 71 (personally reviewed)   ASSESSMENT & PLAN:  1: ICM with mildly reduced ejection fraction- - NYHA Charles II-III - euvolemic - Echo 09/18/2019: EF 40 to 45% - Echo 03/07/21: EF 50-55%, normal RV, moderate LVH - Echo 10/07/21: EF 40% - Echo 07/29/23: EF 55 to 60% - Echo 04/09/24: EF 45-50% with moderate LVH, G1DD, normal RV.  - weighing daily and parameters to call about discussed - continue carvedilol  3.125mg  BID - continue torsemide  20mg / potassium 10meq PRN. Hasn't had to take this since recent discharge - should renal function remain stable, consider beginning valsartan . Patient says that losartan  didn't work and entresto  was too strong for her.  - should renal function remain stable, could consider adding SGLT2 - BNP 05/07/24 was 585.4  2: HTN- - BP 160/67 - reports home BP 140's/50's - continue amlodipine , hydralazine , isosorbide  [place] - saw PCP Stephanie Charles) 05/25 - BMET 05/16/24 reviewed: sodium 141, potassium 4.3, creatinine 1.8 & GFR 26  3: CAD- - saw cardiology Stephanie Charles) 08/25 - continue plavix  75mg  daily - NSTEMI 2016 - NSTEMI 06/2019 (PCI/DES to the LAD), - R/L heart cath 06/07/2019 showed mid LAD 95% stenosis s/p successful PCI/DES, D2 30% stenosis. RHC showed mean RA pressure of 8, RV pressure 32/7 with a mean of 9, PA pressure 31/16 with a mean of 21, PCWP 10, CO/CI 3.57/2.02.  - EKG today is NSR/ LBBB  4: Anemia- - Hg 05/16/24 was 9.3 - continue oral iron  5: Hyperlipidemia- - continue atorvastatin  40mg  daily - LDL 04/09/24 was 31   Return in 6 weeks, sooner if needed.   Stephanie DELENA Class, FNP 05/16/24

## 2024-05-16 NOTE — Telephone Encounter (Signed)
 Called to confirm/remind patient of their appointment at the [facility] on 05/17/24.   Appointment:   [x] Confirmed  [] Left mess   [] No answer/No voice mail  [] VM Full/unable to leave message  [] Phone not in service  Patient reminded to bring all medications and/or complete list.  Confirmed patient has transportation. Gave directions, instructed to utilize valet parking.

## 2024-05-17 ENCOUNTER — Ambulatory Visit: Attending: Family | Admitting: Family

## 2024-05-17 ENCOUNTER — Encounter: Payer: Self-pay | Admitting: Family

## 2024-05-17 VITALS — BP 160/67 | HR 81 | Wt 147.0 lb

## 2024-05-17 DIAGNOSIS — I447 Left bundle-branch block, unspecified: Secondary | ICD-10-CM | POA: Diagnosis not present

## 2024-05-17 DIAGNOSIS — R0609 Other forms of dyspnea: Secondary | ICD-10-CM | POA: Diagnosis present

## 2024-05-17 DIAGNOSIS — Z955 Presence of coronary angioplasty implant and graft: Secondary | ICD-10-CM | POA: Diagnosis not present

## 2024-05-17 DIAGNOSIS — Z7902 Long term (current) use of antithrombotics/antiplatelets: Secondary | ICD-10-CM | POA: Insufficient documentation

## 2024-05-17 DIAGNOSIS — I10 Essential (primary) hypertension: Secondary | ICD-10-CM

## 2024-05-17 DIAGNOSIS — F419 Anxiety disorder, unspecified: Secondary | ICD-10-CM | POA: Insufficient documentation

## 2024-05-17 DIAGNOSIS — I130 Hypertensive heart and chronic kidney disease with heart failure and stage 1 through stage 4 chronic kidney disease, or unspecified chronic kidney disease: Secondary | ICD-10-CM | POA: Diagnosis not present

## 2024-05-17 DIAGNOSIS — E039 Hypothyroidism, unspecified: Secondary | ICD-10-CM | POA: Insufficient documentation

## 2024-05-17 DIAGNOSIS — Z8673 Personal history of transient ischemic attack (TIA), and cerebral infarction without residual deficits: Secondary | ICD-10-CM | POA: Diagnosis not present

## 2024-05-17 DIAGNOSIS — I739 Peripheral vascular disease, unspecified: Secondary | ICD-10-CM | POA: Diagnosis not present

## 2024-05-17 DIAGNOSIS — I2510 Atherosclerotic heart disease of native coronary artery without angina pectoris: Secondary | ICD-10-CM | POA: Insufficient documentation

## 2024-05-17 DIAGNOSIS — I252 Old myocardial infarction: Secondary | ICD-10-CM | POA: Insufficient documentation

## 2024-05-17 DIAGNOSIS — Z79899 Other long term (current) drug therapy: Secondary | ICD-10-CM | POA: Diagnosis not present

## 2024-05-17 DIAGNOSIS — I255 Ischemic cardiomyopathy: Secondary | ICD-10-CM | POA: Insufficient documentation

## 2024-05-17 DIAGNOSIS — I6523 Occlusion and stenosis of bilateral carotid arteries: Secondary | ICD-10-CM | POA: Diagnosis not present

## 2024-05-17 DIAGNOSIS — N182 Chronic kidney disease, stage 2 (mild): Secondary | ICD-10-CM | POA: Diagnosis not present

## 2024-05-17 DIAGNOSIS — D500 Iron deficiency anemia secondary to blood loss (chronic): Secondary | ICD-10-CM | POA: Diagnosis not present

## 2024-05-17 DIAGNOSIS — Z79890 Hormone replacement therapy (postmenopausal): Secondary | ICD-10-CM | POA: Insufficient documentation

## 2024-05-17 DIAGNOSIS — D631 Anemia in chronic kidney disease: Secondary | ICD-10-CM | POA: Insufficient documentation

## 2024-05-17 DIAGNOSIS — I5022 Chronic systolic (congestive) heart failure: Secondary | ICD-10-CM

## 2024-05-17 DIAGNOSIS — E785 Hyperlipidemia, unspecified: Secondary | ICD-10-CM | POA: Diagnosis not present

## 2024-05-17 DIAGNOSIS — F32A Depression, unspecified: Secondary | ICD-10-CM | POA: Diagnosis not present

## 2024-05-17 NOTE — Patient Instructions (Signed)
 It was a pleasure meeting you today!

## 2024-05-22 ENCOUNTER — Ambulatory Visit: Admitting: Cardiovascular Disease

## 2024-05-22 ENCOUNTER — Ambulatory Visit: Admitting: Cardiology

## 2024-05-30 ENCOUNTER — Ambulatory Visit: Admitting: Cardiology

## 2024-06-27 ENCOUNTER — Telehealth: Payer: Self-pay | Admitting: Family

## 2024-06-27 NOTE — Progress Notes (Unsigned)
 Advanced Heart Failure Clinic Note   Referring Physician: 07/25 admission PCP: Bertrum Charlie CROME, MD Cardiologist: Evalene Lunger, MD   Chief Complaint: shortness of breath   HPI:  [name] is a 89 y/o female with a history of LBBB, TIA/CVA in 2016, CKD stage II, HTN, HLD, anemia, carotid artery disease, NSTEMI 06/2019 (PCI/DES to the LAD), NSTEMI 2016, hypothyroidism, PAD, anxiety, depression and chronic heart failure.    Admitted to [facility] in 06/2019 with NSTEMI and HTN. Echo 06/06/2019 showed LVEF 35-40%. Frequenct PVCs noted. R/L heart cath 06/07/2019 showed mid LAD 95% stenosis s/p successful PCI/DES, D2 30% stenosis. RHC showed mean RA pressure of 8, RV pressure 32/7 with a mean of 9, PA pressure 31/16 with a mean of 21, PCWP 10, CO/CI 3.57/2.02.   Admitted 07/2023 with chest pain that woke her up from sleep with elevated blood pressure.  She was treated with IV heparin  x 48 hours.  Echo showed EF of 55 to 60%.  She was restarted on Entresto .  She was felt to be a poor cardiac cath candidate. VQ scan showed no PE.   Went to the ER 01/06/24 for HTN and nausea. At home BP was 213/102. BP in the ER 172/80. HS trop 69>67.Labs were stable. CXR and CT head were reassuring. She was discharged home.   Admitted 04/08/24 with chest pain and shortness of breath. Chest x-ray showed acute pulmonary edema. BNP 359, troponin 468. She is diagnosed with acute congestive heart failure, started on IV Lasix  daily, but this had to be held due to worsening renal function. Echo 04/09/24: EF 45-50% with moderate LVH, G1DD, normal RV. Weaned off oxygen . Entresto  / diuretics held at discharge due to renal function. Losartan  was added.   Admitted 05/07/24 with shortness of breath and bilateral pedal edema due to HF exacerbation initially needing bipap. On antibiotics due to UTI. IV diuresed and then developed AKI. Given albumin , lasix  held and renal function improved. Elevated troponin thought to be due to demand  ischemia.  She presents today for her initial HF visit with a chief complaint of shortness of breath with exertion. Stable since discharge. Has associated fatigue, pedal edema with R.L. Takes her torsemide / potassium as needed but hasn't had to take it since her recent discharge. Weighing daily. Checking BP daily at home and stays 140's/50's. Weight at home within 4-5 pounds.    Previous cardiac studies: Echo 09/18/2019: EF 40 to 45% Echo 03/07/21: EF 50-55%, normal RV, moderate LVH Echo 10/07/21: EF 40% Echo 07/29/23: EF 55 to 60%   Review of Systems: [y] = yes, [ ]  = no   General: Weight gain [ ] ; Weight loss [ ] ; Anorexia [ ] ; Fatigue Stephanie Charles.Dad ]; Fever [ ] ; Chills [ ] ; Weakness [ ]   Cardiac: Chest pain/pressure [ ] ; Resting SOB [ ] ; Exertional SOB Stephanie Charles.Dad ]; Orthopnea [ ] ; Pedal Edema Stephanie Charles.Dad ]; Palpitations [ ] ; Syncope [ ] ; Presyncope [ ] ; Paroxysmal nocturnal dyspnea[ ]   Pulmonary: Cough [ ] ; Wheezing[ ] ; Hemoptysis[ ] ; Sputum [ ] ; Snoring [ ]   GI: Vomiting[ ] ; Dysphagia[ ] ; Melena[ ] ; Hematochezia [ ] ; Heartburn[ ] ; Abdominal pain [ ] ; Constipation [ ] ; Diarrhea [ ] ; BRBPR [ ]   GU: Hematuria[ ] ; Dysuria [ ] ; Nocturia[ ]   Vascular: Pain in legs with walking [ ] ; Pain in feet with lying flat [ ] ; Non-healing sores [ ] ; Stroke [ ] ; TIA [ ] ; Slurred speech [ ] ;  Neuro: Headaches[ ] ; Vertigo[ ] ; Seizures[ ] ; Paresthesias[ ] ;Blurred vision [ ] ;  Diplopia [ ] ; Vision changes [ ]   Ortho/Skin: Arthritis [ ] ; Joint pain [ ] ; Muscle pain [ ] ; Joint swelling [ ] ; Back Pain [ ] ; Rash [ ]   Psych: Depression[ ] ; Anxiety[y ]  Heme: Bleeding problems [ ] ; Clotting disorders [ ] ; Anemia Stephanie Charles.Dad ]  Endocrine: Diabetes [ ] ; Thyroid  dysfunction[y ]   Past Medical History:  Diagnosis Date   Anemia    C. difficile diarrhea    CHF (congestive heart failure) (HCC) 10/05/2021   Diverticulitis    GERD (gastroesophageal reflux disease)    Hyperlipidemia    Hypertension    Hypothyroidism    Myocardial infarction (HCC) 08/2017    TIA (transient ischemic attack)    1 approx 2015, 1 approx 2018   Vertigo    last episode several months ago   [name]     Current Outpatient Medications  Medication Sig Dispense Refill   albuterol  (PROVENTIL ) (2.5 MG/3ML) 0.083% nebulizer solution USE 1 VIAL IN NEBULIZER EVERY 4 HOURS AS NEEDED 180 mL 6   allopurinol  (ZYLOPRIM ) 300 MG tablet Take 300 mg by mouth daily.     amitriptyline  (ELAVIL ) 10 MG tablet Take 1 tablet (10 mg total) by mouth at bedtime. (Patient taking differently: Take 20 mg by mouth at bedtime.) 90 tablet 3   amLODipine  (NORVASC ) 5 MG tablet Take 1 tablet (5 mg total) by mouth daily. 30 tablet 0   ASPIRIN  LOW DOSE 81 MG EC tablet NEW PRESCRIPTION REQUEST: TAKE ONE TABLET BY MOUTH EVERY DAY 90 tablet 3   atorvastatin  (LIPITOR) 40 MG tablet Take 1 tablet by mouth once daily 90 tablet 3   carvedilol  (COREG ) 3.125 MG tablet Take 1 tablet (3.125 mg total) by mouth 2 (two) times daily with a meal. 180 tablet 3   clopidogrel  (PLAVIX ) 75 MG tablet Take 1 tablet by mouth once daily 90 tablet 3   cyclobenzaprine  (FLEXERIL ) 5 MG tablet Take 5 mg by mouth 3 (three) times daily as needed.     ferrous sulfate  325 (65 FE) MG tablet Take 325 mg by mouth at bedtime.     fluorometholone  (FML) 0.1 % ophthalmic suspension Place 1 drop into both eyes 2 (two) times daily.     gabapentin  (NEURONTIN ) 100 MG capsule Take 1 capsule by mouth at bedtime.     hydrALAZINE  (APRESOLINE ) 100 MG tablet Take 1 tablet (100 mg total) by mouth 3 (three) times daily. TAKE 1 TABLET BY MOUTH THREE TIMES DAILY FOR HIGH BLOOD PRESSURE 270 tablet 1   isosorbide  mononitrate (IMDUR ) 60 MG 24 hr tablet Take 1 tablet (60 mg total) by mouth 2 (two) times daily. 180 tablet 3   levothyroxine  (SYNTHROID ) 137 MCG tablet Take 1 tablet (137 mcg total) by mouth daily before breakfast. 90 tablet 3   Melatonin 10 MG TABS Take 10 tablets by mouth at bedtime.     mirtazapine  (REMERON ) 30 MG tablet TAKE  1 TABLET BY MOUTH AT BEDTIME 90 tablet 3   nitroGLYCERIN  (NITROSTAT ) 0.4 MG SL tablet Place 1 tablet (0.4 mg total) under the tongue every 5 (five) minutes as needed for chest pain. 25 tablet 3   pantoprazole  (PROTONIX ) 40 MG tablet Take 1 tablet (40 mg total) by mouth daily. 90 tablet 3   potassium chloride  (MICRO-K ) 10 MEQ CR capsule Take 10 mEq by mouth as needed.     sertraline  (ZOLOFT ) 25 MG tablet Take 1 tablet by mouth once daily 90 tablet 3   torsemide  (  DEMADEX ) 20 MG tablet Take 20 mg by mouth as needed.     No current facility-administered medications for this visit.    Allergies  Allergen Reactions   Codeine Nausea And Vomiting and Nausea Only   Levofloxacin Other (See Comments)    Mouth sores   Lisinopril Other (See Comments) and Cough    Cough?   Other Itching   Povidone-Iodine Other (See Comments), Rash and Dermatitis    Severe blistering and itchiness. Severe redness   Simvastatin Other (See Comments)    Myalgias   Latex Itching      Social History   Socioeconomic History   Marital status: Divorced    Spouse name: na   Number of children: 4   Years of education: 14   Highest education level: Associate degree: occupational, Scientist, product/process development, or vocational program  Occupational History   Occupation: retired  Tobacco Use   Smoking status: Never   Smokeless tobacco: Never  Vaping Use   Vaping status: Never Used  Substance and Sexual Activity   Alcohol use: No    Alcohol/week: 0.0 standard drinks of alcohol   Drug use: No   Sexual activity: Never  Other Topics Concern   Not on file  Social History Narrative   Not on file   Social Drivers of Health   Financial Resource Strain: Medium Risk (04/10/2024)   Overall Financial Resource Strain (CARDIA)    Difficulty of Paying Living Expenses: Somewhat hard  Food Insecurity: No Food Insecurity (05/08/2024)   Hunger Vital Sign    Worried About Running Out of Food in the Last Year: Never true    Ran Out of Food in the  Last Year: Never true  Transportation Needs: No Transportation Needs (05/08/2024)   PRAPARE - Administrator, Civil Service (Medical): No    Lack of Transportation (Non-Medical): No  Physical Activity: Inactive (08/18/2020)   Exercise Vital Sign    Days of Exercise per Week: 0 days    Minutes of Exercise per Session: 0 min  Stress: No Stress Concern Present (08/18/2020)   [employer] of Occupational Health - Occupational Stress Questionnaire    Feeling of Stress : Not at all  Social Connections: Socially Isolated (05/08/2024)   Social Connection and Isolation Panel    Frequency of Communication with Friends and Family: Never    Frequency of Social Gatherings with Friends and Family: More than three times a week    Attends Religious Services: Never    Database administrator or Organizations: No    Attends Banker Meetings: Never    Marital Status: Divorced  Catering manager Violence: Not At Risk (05/08/2024)   Humiliation, Afraid, Rape, and Kick questionnaire    Fear of Current or Ex-Partner: No    Emotionally Abused: No    Physically Abused: No    Sexually Abused: No      Family History  Problem Relation Age of Onset   Heart disease Mother    Hypertension Mother    Stroke Mother    Heart disease Father    Breast cancer Sister    Alzheimer's disease Brother    Heart attack Brother    Stroke Brother    Heart disease Brother    Mental illness Brother        died suicide   Cancer Brother        prostate   Heart disease Brother        atrial fib   Alzheimer's  disease Brother    Clotting disorder Daughter    Fibromyalgia Daughter    There were no vitals filed for this visit.  Wt Readings from Last 3 Encounters:  05/17/24 147 lb (66.7 kg)  05/11/24 156 lb 1.4 oz (70.8 kg)  04/12/24 148 lb 2.4 oz (67.2 kg)   Lab Results  Component Value Date   CREATININE 2.45 (H) 05/11/2024   CREATININE 2.78 (H) 05/10/2024   CREATININE 2.19 (H) 05/09/2024     PHYSICAL EXAM:  General: Well appearing.  Cor: No JVD. Regular rhythm, rate.  Lungs: clear Abdomen: soft, nontender, nondistended. Extremities: 1+ pitting edema around bilateral ankles with R>L Neuro:. Affect pleasant   ECG: NSR/ LBBB with HR 71 (personally reviewed)   ASSESSMENT & PLAN:  1: ICM with mildly reduced ejection fraction- - NYHA class II-III - euvolemic - Echo 09/18/2019: EF 40 to 45% - Echo 03/07/21: EF 50-55%, normal RV, moderate LVH - Echo 10/07/21: EF 40% - Echo 07/29/23: EF 55 to 60% - Echo 04/09/24: EF 45-50% with moderate LVH, G1DD, normal RV.  - weighing daily and parameters to call about discussed - continue carvedilol  3.125mg  BID - continue torsemide  20mg / potassium 10meq PRN. Hasn't had to take this since recent discharge - should renal function remain stable, consider beginning valsartan . Patient says that losartan  didn't work and entresto  was too strong for her.  - should renal function remain stable, could consider adding SGLT2 - BNP 05/07/24 was 585.4  2: HTN- - BP 160/67 - reports home BP 140's/50's - continue amlodipine , hydralazine , isosorbide  [place] - saw PCP Garnet) 05/25 - BMET 05/16/24 reviewed: sodium 141, potassium 4.3, creatinine 1.8 & GFR 26  3: CAD- - saw cardiology Florestine) 08/25 - continue plavix  75mg  daily - NSTEMI 2016 - NSTEMI 06/2019 (PCI/DES to the LAD), - R/L heart cath 06/07/2019 showed mid LAD 95% stenosis s/p successful PCI/DES, D2 30% stenosis. RHC showed mean RA pressure of 8, RV pressure 32/7 with a mean of 9, PA pressure 31/16 with a mean of 21, PCWP 10, CO/CI 3.57/2.02.  - EKG today is NSR/ LBBB  4: Anemia- - Hg 05/16/24 was 9.3 - continue oral iron  5: Hyperlipidemia- - continue atorvastatin  40mg  daily - LDL 04/09/24 was 31   Return in 6 weeks, sooner if needed.   Ellouise DELENA Class, FNP 06/27/24

## 2024-06-27 NOTE — Telephone Encounter (Signed)
 Called to confirm/remind patient of their appointment at the [facility] on 06/28/24.   Appointment:   [] Confirmed  [x] Left mess   [] No answer/No voice mail  [] VM Full/unable to leave message  [] Phone not in service  Patient reminded to bring all medications and/or complete list.  Confirmed patient has transportation. Gave directions, instructed to utilize valet parking.

## 2024-06-28 ENCOUNTER — Ambulatory Visit: Attending: Family | Admitting: Family

## 2024-06-28 ENCOUNTER — Encounter: Payer: Self-pay | Admitting: Family

## 2024-06-28 VITALS — BP 168/74 | HR 81 | Resp 16 | Wt 148.0 lb

## 2024-06-28 DIAGNOSIS — F32A Depression, unspecified: Secondary | ICD-10-CM | POA: Diagnosis not present

## 2024-06-28 DIAGNOSIS — I447 Left bundle-branch block, unspecified: Secondary | ICD-10-CM | POA: Diagnosis not present

## 2024-06-28 DIAGNOSIS — Z955 Presence of coronary angioplasty implant and graft: Secondary | ICD-10-CM | POA: Diagnosis not present

## 2024-06-28 DIAGNOSIS — I130 Hypertensive heart and chronic kidney disease with heart failure and stage 1 through stage 4 chronic kidney disease, or unspecified chronic kidney disease: Secondary | ICD-10-CM | POA: Diagnosis not present

## 2024-06-28 DIAGNOSIS — Z79899 Other long term (current) drug therapy: Secondary | ICD-10-CM | POA: Diagnosis not present

## 2024-06-28 DIAGNOSIS — Z8673 Personal history of transient ischemic attack (TIA), and cerebral infarction without residual deficits: Secondary | ICD-10-CM | POA: Diagnosis not present

## 2024-06-28 DIAGNOSIS — I739 Peripheral vascular disease, unspecified: Secondary | ICD-10-CM | POA: Insufficient documentation

## 2024-06-28 DIAGNOSIS — R0602 Shortness of breath: Secondary | ICD-10-CM | POA: Diagnosis present

## 2024-06-28 DIAGNOSIS — Z7982 Long term (current) use of aspirin: Secondary | ICD-10-CM | POA: Diagnosis not present

## 2024-06-28 DIAGNOSIS — Z7902 Long term (current) use of antithrombotics/antiplatelets: Secondary | ICD-10-CM | POA: Insufficient documentation

## 2024-06-28 DIAGNOSIS — D631 Anemia in chronic kidney disease: Secondary | ICD-10-CM | POA: Insufficient documentation

## 2024-06-28 DIAGNOSIS — I2510 Atherosclerotic heart disease of native coronary artery without angina pectoris: Secondary | ICD-10-CM | POA: Diagnosis not present

## 2024-06-28 DIAGNOSIS — I255 Ischemic cardiomyopathy: Secondary | ICD-10-CM | POA: Insufficient documentation

## 2024-06-28 DIAGNOSIS — I10 Essential (primary) hypertension: Secondary | ICD-10-CM

## 2024-06-28 DIAGNOSIS — E039 Hypothyroidism, unspecified: Secondary | ICD-10-CM | POA: Diagnosis not present

## 2024-06-28 DIAGNOSIS — N182 Chronic kidney disease, stage 2 (mild): Secondary | ICD-10-CM | POA: Insufficient documentation

## 2024-06-28 DIAGNOSIS — I5022 Chronic systolic (congestive) heart failure: Secondary | ICD-10-CM | POA: Diagnosis not present

## 2024-06-28 DIAGNOSIS — F419 Anxiety disorder, unspecified: Secondary | ICD-10-CM | POA: Insufficient documentation

## 2024-06-28 DIAGNOSIS — D500 Iron deficiency anemia secondary to blood loss (chronic): Secondary | ICD-10-CM

## 2024-06-28 DIAGNOSIS — I252 Old myocardial infarction: Secondary | ICD-10-CM | POA: Insufficient documentation

## 2024-06-28 DIAGNOSIS — Z79890 Hormone replacement therapy (postmenopausal): Secondary | ICD-10-CM | POA: Insufficient documentation

## 2024-06-28 DIAGNOSIS — E785 Hyperlipidemia, unspecified: Secondary | ICD-10-CM | POA: Diagnosis not present

## 2024-06-28 MED ORDER — VALSARTAN 40 MG PO TABS: 40.0000 mg | ORAL_TABLET | Freq: Every day | ORAL | 3 refills | Status: DC

## 2024-06-28 NOTE — Patient Instructions (Signed)
 Medication Changes:  Start Valsartan  40 MG once daily  Take Torsemide  once daily for 2 days.  Then, you can go back to taking torsemide  as needed for weight gain or swelling.   Lab Work:  Go over to the [facility]. Go pass the gift shop and have your blood work completed.  We will only call you if the results are abnormal or if the provider would like to make medication changes.  No news is good news.   Follow-Up in: 2-3 weeks with Ellouise Class, FNP.   Thank you for choosing [facility].    At the [facility], you and your health needs are our priority. We have a designated team specialized in the treatment of Heart Failure. This Care Team includes your primary Heart Failure Specialized Cardiologist (physician), Advanced Practice Providers (APPs- Physician Assistants and Nurse Practitioners), and Pharmacist who all work together to provide you with the care you need, when you need it.   You may see any of the following providers on your designated Care Team at your next follow up:  Dr. Toribio Fuel Dr. Ezra Shuck Dr. Ria Commander Dr. Morene Brownie Ellouise Class, FNP Jaun Bash, RPH-CPP  Please be sure to bring in all your medications bottles to every appointment.   Need to Contact Us :  If you have any questions or concerns before your next appointment please send us  a message through [place] or call our office at [phone].    TO LEAVE A MESSAGE FOR THE NURSE SELECT OPTION 2, PLEASE LEAVE A MESSAGE INCLUDING: YOUR NAME DATE OF BIRTH CALL BACK NUMBER REASON FOR CALL**this is important as we prioritize the call backs  YOU WILL RECEIVE A CALL BACK THE SAME DAY AS LONG AS YOU CALL BEFORE 4:00 PM

## 2024-06-29 ENCOUNTER — Telehealth: Payer: Self-pay | Admitting: Family

## 2024-06-29 NOTE — Addendum Note (Signed)
 Addended by: Adrine Hayworth A on: 06/29/2024 09:41 AM   Modules accepted: Orders

## 2024-07-10 ENCOUNTER — Telehealth: Payer: Self-pay | Admitting: Family

## 2024-07-10 NOTE — Telephone Encounter (Signed)
 Called to confirm/remind patient of their appointment at the [facility] on 07/11/24.   Appointment:   [] Confirmed  [x] Left mess   [] No answer/No voice mail  [] VM Full/unable to leave message  [] Phone not in service  Patient reminded to bring all medications and/or complete list.  Confirmed patient has transportation. Gave directions, instructed to utilize valet parking.

## 2024-07-10 NOTE — Progress Notes (Unsigned)
 [facility] Note   Referring Physician: 07/25 admission PCP: Bertrum Charlie CROME, MD Cardiologist: Evalene Lunger, MD   Chief Complaint: shortness of breath   HPI:  Ms Minner is a 89 y/o female with a history of LBBB, TIA/CVA in 2016, CKD stage II, HTN, HLD, anemia, carotid artery disease, NSTEMI 06/2019 (PCI/DES to the LAD), NSTEMI 2016, hypothyroidism, PAD, anxiety, depression and chronic heart failure.    Admitted to [facility] in 06/2019 with NSTEMI and HTN. Echo 06/06/2019 showed LVEF 35-40%. Frequenct PVCs noted. R/L heart cath 06/07/2019 showed mid LAD 95% stenosis s/p successful PCI/DES, D2 30% stenosis. RHC showed mean RA pressure of 8, RV pressure 32/7 with a mean of 9, PA pressure 31/16 with a mean of 21, PCWP 10, CO/CI 3.57/2.02.   Admitted 07/2023 with chest pain that woke her up from sleep with elevated blood pressure.  She was treated with IV heparin  x 48 hours.  Echo showed EF of 55 to 60%.  She was restarted on Entresto .  She was felt to be a poor cardiac cath candidate. VQ scan showed no PE.   Went to the ER 01/06/24 for HTN and nausea. At home BP was 213/102. BP in the ER 172/80. HS trop 69>67.Labs were stable. CXR and CT head were reassuring. She was discharged home.   Admitted 04/08/24 with chest pain and shortness of breath. Chest x-ray showed acute pulmonary edema. BNP 359, troponin 468. She is diagnosed with acute congestive heart failure, started on IV Lasix  daily, but this had to be held due to worsening renal function. Echo 04/09/24: EF 45-50% with moderate LVH, G1DD, normal RV. Weaned off oxygen . Entresto  / diuretics held at discharge due to renal function. Losartan  was added.   Admitted 05/07/24 with shortness of breath and bilateral pedal edema due to HF exacerbation initially needing bipap. On antibiotics due to UTI. IV diuresed and then developed AKI. Given albumin , lasix  held and renal function improved. Elevated troponin thought to be due to demand  ischemia.  She presents today, with her daughter, for a HF follow-up visit with a chief complaint of moderate shortness of breath. Has associated fatigue, pedal edema. Last took torsemide  2 days ago. When she took 40mg  valsartan  at one time, she developed a severe HA along with diarrhea. She subsequently split the tablet up and now takes 20mg  BID without any side effects.   Getting cataracts repaired soon and she's looking to being able to see. Had a UTI earlier this year but prior to that it had been years since she had one.    Previous cardiac studies: Echo 09/18/2019: EF 40 to 45% Echo 03/07/21: EF 50-55%, normal RV, moderate LVH Echo 10/07/21: EF 40% Echo 07/29/23: EF 55 to 60%   ROS: All systems negative except what is listed in HPI, PMH and Problem List  Past Medical History:  Diagnosis Date   Anemia    C. difficile diarrhea    CHF (congestive heart failure) (HCC) 10/05/2021   Diverticulitis    GERD (gastroesophageal reflux disease)    Hyperlipidemia    Hypertension    Hypothyroidism    Myocardial infarction (HCC) 08/2017   TIA (transient ischemic attack)    1 approx 2015, 1 approx 2018   Vertigo    last episode several months ago   [name]     Current Outpatient Medications  Medication Sig Dispense Refill   albuterol  (PROVENTIL ) (2.5 MG/3ML) 0.083% nebulizer solution USE 1 VIAL IN NEBULIZER  EVERY 4 HOURS AS NEEDED 180 mL 6   allopurinol  (ZYLOPRIM ) 300 MG tablet Take 300 mg by mouth daily.     amitriptyline  (ELAVIL ) 10 MG tablet Take 1 tablet (10 mg total) by mouth at bedtime. (Patient taking differently: Take 20 mg by mouth at bedtime.) 90 tablet 3   amLODipine  (NORVASC ) 5 MG tablet Take 1 tablet (5 mg total) by mouth daily. 30 tablet 0   ASPIRIN  LOW DOSE 81 MG EC tablet NEW PRESCRIPTION REQUEST: TAKE ONE TABLET BY MOUTH EVERY DAY 90 tablet 3   atorvastatin  (LIPITOR) 40 MG tablet Take 1 tablet by mouth once daily 90 tablet 3   carvedilol  (COREG ) 3.125  MG tablet Take 1 tablet (3.125 mg total) by mouth 2 (two) times daily with a meal. 180 tablet 3   clopidogrel  (PLAVIX ) 75 MG tablet Take 1 tablet by mouth once daily 90 tablet 3   cyclobenzaprine  (FLEXERIL ) 5 MG tablet Take 5 mg by mouth 3 (three) times daily as needed.     ferrous sulfate  325 (65 FE) MG tablet Take 325 mg by mouth at bedtime.     fluorometholone  (FML) 0.1 % ophthalmic suspension Place 1 drop into both eyes 2 (two) times daily.     gabapentin  (NEURONTIN ) 100 MG capsule Take 1 capsule by mouth at bedtime.     hydrALAZINE  (APRESOLINE ) 100 MG tablet Take 1 tablet (100 mg total) by mouth 3 (three) times daily. TAKE 1 TABLET BY MOUTH THREE TIMES DAILY FOR HIGH BLOOD PRESSURE 270 tablet 1   isosorbide  mononitrate (IMDUR ) 60 MG 24 hr tablet Take 1 tablet (60 mg total) by mouth 2 (two) times daily. 180 tablet 3   levothyroxine  (SYNTHROID ) 137 MCG tablet Take 1 tablet (137 mcg total) by mouth daily before breakfast. 90 tablet 3   Melatonin 10 MG TABS Take 10 tablets by mouth at bedtime.     mirtazapine  (REMERON ) 30 MG tablet TAKE 1 TABLET BY MOUTH AT BEDTIME 90 tablet 3   nitroGLYCERIN  (NITROSTAT ) 0.4 MG SL tablet Place 1 tablet (0.4 mg total) under the tongue every 5 (five) minutes as needed for chest pain. 25 tablet 3   pantoprazole  (PROTONIX ) 40 MG tablet Take 1 tablet (40 mg total) by mouth daily. 90 tablet 3   potassium chloride  (MICRO-K ) 10 MEQ CR capsule Take 10 mEq by mouth as needed.     sertraline  (ZOLOFT ) 25 MG tablet Take 1 tablet by mouth once daily 90 tablet 3   torsemide  (DEMADEX ) 20 MG tablet Take 20 mg by mouth as needed.     valsartan  (DIOVAN ) 40 MG tablet Take 1 tablet (40 mg total) by mouth daily. 90 tablet 3   No current facility-administered medications for this visit.    Allergies  Allergen Reactions   Codeine Nausea And Vomiting and Nausea Only   Levofloxacin Other (See Comments)    Mouth sores   Lisinopril Other (See Comments) and Cough    Cough?   Other  Itching   Povidone-Iodine Other (See Comments), Rash and Dermatitis    Severe blistering and itchiness. Severe redness   Simvastatin Other (See Comments)    Myalgias   Latex Itching      Social History   Socioeconomic History   Marital status: Divorced    Spouse name: na   Number of children: 4   Years of education: 14   Highest education level: Associate degree: occupational, Scientist, product/process development, or vocational program  Occupational History   Occupation: retired  Tobacco Use  Smoking status: Never   Smokeless tobacco: Never  Vaping Use   Vaping status: Never Used  Substance and Sexual Activity   Alcohol use: No    Alcohol/week: 0.0 standard drinks of alcohol   Drug use: No   Sexual activity: Never  Other Topics Concern   Not on file  Social History Narrative   Not on file   Social Drivers of Health   Financial Resource Strain: Medium Risk (04/10/2024)   Overall Financial Resource Strain (CARDIA)    Difficulty of Paying Living Expenses: Somewhat hard  Food Insecurity: No Food Insecurity (05/08/2024)   Hunger Vital Sign    Worried About Running Out of Food in the Last Year: Never true    Ran Out of Food in the Last Year: Never true  Transportation Needs: No Transportation Needs (05/08/2024)   PRAPARE - Administrator, Civil Service (Medical): No    Lack of Transportation (Non-Medical): No  Physical Activity: Inactive (08/18/2020)   Exercise Vital Sign    Days of Exercise per Week: 0 days    Minutes of Exercise per Session: 0 min  Stress: No Stress Concern Present (08/18/2020)   [employer] of Occupational Health - Occupational Stress Questionnaire    Feeling of Stress : Not at all  Social Connections: Socially Isolated (05/08/2024)   Social Connection and Isolation Panel    Frequency of Communication with Friends and Family: Never    Frequency of Social Gatherings with Friends and Family: More than three times a week    Attends Religious Services: Never     Database administrator or Organizations: No    Attends Banker Meetings: Never    Marital Status: Divorced  Catering manager Violence: Not At Risk (05/08/2024)   Humiliation, Afraid, Rape, and Kick questionnaire    Fear of Current or Ex-Partner: No    Emotionally Abused: No    Physically Abused: No    Sexually Abused: No      Family History  Problem Relation Age of Onset   Heart disease Mother    Hypertension Mother    Stroke Mother    Heart disease Father    Breast cancer Sister    Alzheimer's disease Brother    Heart attack Brother    Stroke Brother    Heart disease Brother    Mental illness Brother        died suicide   Cancer Brother        prostate   Heart disease Brother        atrial fib   Alzheimer's disease Brother    Clotting disorder Daughter    Fibromyalgia Daughter    Vitals:   07/11/24 1457  BP: (!) 129/92  Pulse: 79  SpO2: 95%  Weight: 151 lb 3.2 oz (68.6 kg)   Wt Readings from Last 3 Encounters:  07/11/24 151 lb 3.2 oz (68.6 kg)  06/28/24 148 lb (67.1 kg)  05/17/24 147 lb (66.7 kg)   Lab Results  Component Value Date   CREATININE 2.45 (H) 05/11/2024   CREATININE 2.78 (H) 05/10/2024   CREATININE 2.19 (H) 05/09/2024    PHYSICAL EXAM:  General: Well appearing.  Cor: No JVD. Regular rhythm, rate.  Lungs: clear Abdomen: soft, nontender, nondistended. Extremities: 1+ pitting edema bilateral lower legs to mid shins Neuro:. Affect pleasant    ECG: not done   ASSESSMENT & PLAN:  1: ICM with mildly reduced ejection fraction- - NYHA class III - euvolemic -  Echo 09/18/2019: EF 40 to 45% - Echo 03/07/21: EF 50-55%, normal RV, moderate LVH - Echo 10/07/21: EF 40% - Echo 07/29/23: EF 55 to 60% - Echo 04/09/24: EF 45-50% with moderate LVH, G1DD, normal RV.  - weight up 3 pounds from last visit here 2 weeks ago - continue carvedilol  3.125mg  BID - continue torsemide  20mg / potassium 10meq PRN.  - continue valsartan  20mg  BID. When she  took 40mg  at one time, she developed HA / diarrhea - begin jardiance 10mg  daily. 30 day voucher provided. Had UTI earlier this year but prior to that it had been years.  - BMET today and repeat at next visit - Explained the importance of keeping her BP under control yet managing her fluid retention & kidney function - Wearing compression socks daily - BNP 05/07/24 was 585.4  2: HTN- - BP 129/92 - continue amlodipine , hydralazine , isosorbide  MN, valsartan  - saw PCP Garnet) 08/25 - BMET 05/29/24 reviewed: sodium 139, potassium 4.3, creatinine 1.5 & GFR 33 - BMET today   3: CAD- - saw cardiology Florestine) 08/25; returns 10/25 - continue plavix  75mg  daily - NSTEMI 2016 - NSTEMI 06/2019 (PCI/DES to the LAD), - R/L heart cath 06/07/2019 showed mid LAD 95% stenosis s/p successful PCI/DES, D2 30% stenosis. RHC showed mean RA pressure of 8, RV pressure 32/7 with a mean of 9, PA pressure 31/16 with a mean of 21, PCWP 10, CO/CI 3.57/2.02.   4: Anemia- - Hg 05/29/24 was 10.3 - continue oral iron  5: Hyperlipidemia- - continue atorvastatin  40mg  daily - LDL 04/09/24 was 31   Return in 1 month, sooner if needed.   I spent 38 minutes reviewing records, interviewing/ examing patient and managing plan/ orders.   Ellouise DELENA Class, FNP 07/10/24

## 2024-07-11 ENCOUNTER — Ambulatory Visit: Attending: Family | Admitting: Family

## 2024-07-11 ENCOUNTER — Encounter: Payer: Self-pay | Admitting: Family

## 2024-07-11 VITALS — BP 129/92 | HR 79 | Wt 151.2 lb

## 2024-07-11 DIAGNOSIS — I739 Peripheral vascular disease, unspecified: Secondary | ICD-10-CM | POA: Diagnosis not present

## 2024-07-11 DIAGNOSIS — E039 Hypothyroidism, unspecified: Secondary | ICD-10-CM | POA: Diagnosis not present

## 2024-07-11 DIAGNOSIS — Z91040 Latex allergy status: Secondary | ICD-10-CM | POA: Insufficient documentation

## 2024-07-11 DIAGNOSIS — I130 Hypertensive heart and chronic kidney disease with heart failure and stage 1 through stage 4 chronic kidney disease, or unspecified chronic kidney disease: Secondary | ICD-10-CM | POA: Diagnosis not present

## 2024-07-11 DIAGNOSIS — Z7984 Long term (current) use of oral hypoglycemic drugs: Secondary | ICD-10-CM | POA: Diagnosis not present

## 2024-07-11 DIAGNOSIS — F32A Depression, unspecified: Secondary | ICD-10-CM | POA: Insufficient documentation

## 2024-07-11 DIAGNOSIS — I5022 Chronic systolic (congestive) heart failure: Secondary | ICD-10-CM | POA: Diagnosis present

## 2024-07-11 DIAGNOSIS — H269 Unspecified cataract: Secondary | ICD-10-CM | POA: Diagnosis not present

## 2024-07-11 DIAGNOSIS — I447 Left bundle-branch block, unspecified: Secondary | ICD-10-CM | POA: Diagnosis not present

## 2024-07-11 DIAGNOSIS — D500 Iron deficiency anemia secondary to blood loss (chronic): Secondary | ICD-10-CM | POA: Diagnosis not present

## 2024-07-11 DIAGNOSIS — I252 Old myocardial infarction: Secondary | ICD-10-CM | POA: Insufficient documentation

## 2024-07-11 DIAGNOSIS — Z7902 Long term (current) use of antithrombotics/antiplatelets: Secondary | ICD-10-CM | POA: Diagnosis not present

## 2024-07-11 DIAGNOSIS — Z8673 Personal history of transient ischemic attack (TIA), and cerebral infarction without residual deficits: Secondary | ICD-10-CM | POA: Diagnosis not present

## 2024-07-11 DIAGNOSIS — E785 Hyperlipidemia, unspecified: Secondary | ICD-10-CM | POA: Diagnosis not present

## 2024-07-11 DIAGNOSIS — F419 Anxiety disorder, unspecified: Secondary | ICD-10-CM | POA: Insufficient documentation

## 2024-07-11 DIAGNOSIS — Z955 Presence of coronary angioplasty implant and graft: Secondary | ICD-10-CM | POA: Diagnosis not present

## 2024-07-11 DIAGNOSIS — N182 Chronic kidney disease, stage 2 (mild): Secondary | ICD-10-CM | POA: Diagnosis not present

## 2024-07-11 DIAGNOSIS — Z79899 Other long term (current) drug therapy: Secondary | ICD-10-CM | POA: Insufficient documentation

## 2024-07-11 DIAGNOSIS — I2510 Atherosclerotic heart disease of native coronary artery without angina pectoris: Secondary | ICD-10-CM | POA: Insufficient documentation

## 2024-07-11 DIAGNOSIS — D631 Anemia in chronic kidney disease: Secondary | ICD-10-CM | POA: Diagnosis not present

## 2024-07-11 DIAGNOSIS — I10 Essential (primary) hypertension: Secondary | ICD-10-CM | POA: Diagnosis not present

## 2024-07-11 MED ORDER — EMPAGLIFLOZIN 10 MG PO TABS: 10.0000 mg | ORAL_TABLET | Freq: Every day | ORAL | 3 refills | Status: DC

## 2024-07-11 NOTE — Patient Instructions (Addendum)
 Medication Changes:  Start Jardiance 10 MG once daily   Lab Work:  Go downstairs to [place] on [address] to have your blood work completed.  We will only call you if the results are abnormal or if the provider would like to make medication changes.  No news is good news.   Follow-Up in: 1 month with Ellouise Class, FNP.   Thank you for choosing [facility].    At the [facility], you and your health needs are our priority. We have a designated team specialized in the treatment of Heart Failure. This Care Team includes your primary Heart Failure Specialized Cardiologist (physician), Advanced Practice Providers (APPs- Physician Assistants and Nurse Practitioners), and Pharmacist who all work together to provide you with the care you need, when you need it.   You may see any of the following providers on your designated Care Team at your next follow up:  Dr. Toribio Fuel Dr. Ezra Shuck Dr. Ria Commander Dr. Morene Brownie Ellouise Class, FNP Jaun Bash, RPH-CPP  Please be sure to bring in all your medications bottles to every appointment.   Need to Contact Us :  If you have any questions or concerns before your next appointment please send us  a message through [place] or call our office at [phone].    TO LEAVE A MESSAGE FOR THE NURSE SELECT OPTION 2, PLEASE LEAVE A MESSAGE INCLUDING: YOUR NAME DATE OF BIRTH CALL BACK NUMBER REASON FOR CALL**this is important as we prioritize the call backs  YOU WILL RECEIVE A CALL BACK THE SAME DAY AS LONG AS YOU CALL BEFORE 4:00 PM

## 2024-07-12 ENCOUNTER — Ambulatory Visit: Payer: Self-pay | Admitting: Family

## 2024-07-12 LAB — BASIC METABOLIC PANEL WITH GFR
BUN/Creatinine Ratio: 18 (ref 12–28)
BUN: 32 mg/dL (ref 10–36)
CO2: 23 mmol/L (ref 20–29)
Calcium: 9.4 mg/dL (ref 8.7–10.3)
Chloride: 97 mmol/L (ref 96–106)
Creatinine, Ser: 1.74 mg/dL — ABNORMAL HIGH (ref 0.57–1.00)
Glucose: 101 mg/dL — ABNORMAL HIGH (ref 70–99)
Potassium: 4.3 mmol/L (ref 3.5–5.2)
Sodium: 139 mmol/L (ref 134–144)
eGFR: 27 mL/min/1.73 — ABNORMAL LOW (ref >59)

## 2024-07-19 ENCOUNTER — Other Ambulatory Visit: Payer: Self-pay

## 2024-07-19 ENCOUNTER — Emergency Department

## 2024-07-19 ENCOUNTER — Observation Stay
Admission: EM | Admit: 2024-07-19 | Discharge: 2024-07-21 | Disposition: A | Discharge status: Home Health | Attending: Osteopathic Medicine | Admitting: Osteopathic Medicine

## 2024-07-19 DIAGNOSIS — Z79899 Other long term (current) drug therapy: Secondary | ICD-10-CM | POA: Insufficient documentation

## 2024-07-19 DIAGNOSIS — E66811 Obesity, class 1: Secondary | ICD-10-CM | POA: Diagnosis not present

## 2024-07-19 DIAGNOSIS — J9601 Acute respiratory failure with hypoxia: Secondary | ICD-10-CM | POA: Diagnosis not present

## 2024-07-19 DIAGNOSIS — I130 Hypertensive heart and chronic kidney disease with heart failure and stage 1 through stage 4 chronic kidney disease, or unspecified chronic kidney disease: Principal | ICD-10-CM | POA: Insufficient documentation

## 2024-07-19 DIAGNOSIS — I5023 Acute on chronic systolic (congestive) heart failure: Secondary | ICD-10-CM | POA: Diagnosis not present

## 2024-07-19 DIAGNOSIS — I161 Hypertensive emergency: Secondary | ICD-10-CM | POA: Diagnosis not present

## 2024-07-19 DIAGNOSIS — I509 Heart failure, unspecified: Secondary | ICD-10-CM

## 2024-07-19 DIAGNOSIS — N1832 Chronic kidney disease, stage 3b: Secondary | ICD-10-CM | POA: Insufficient documentation

## 2024-07-19 DIAGNOSIS — I5043 Acute on chronic combined systolic (congestive) and diastolic (congestive) heart failure: Secondary | ICD-10-CM

## 2024-07-19 DIAGNOSIS — Z6829 Body mass index (BMI) 29.0-29.9, adult: Secondary | ICD-10-CM | POA: Diagnosis not present

## 2024-07-19 DIAGNOSIS — N390 Urinary tract infection, site not specified: Secondary | ICD-10-CM | POA: Insufficient documentation

## 2024-07-19 DIAGNOSIS — R262 Difficulty in walking, not elsewhere classified: Secondary | ICD-10-CM | POA: Diagnosis not present

## 2024-07-19 DIAGNOSIS — M6281 Muscle weakness (generalized): Secondary | ICD-10-CM | POA: Insufficient documentation

## 2024-07-19 DIAGNOSIS — E039 Hypothyroidism, unspecified: Secondary | ICD-10-CM | POA: Insufficient documentation

## 2024-07-19 DIAGNOSIS — R54 Age-related physical debility: Secondary | ICD-10-CM | POA: Diagnosis not present

## 2024-07-19 DIAGNOSIS — I2510 Atherosclerotic heart disease of native coronary artery without angina pectoris: Secondary | ICD-10-CM | POA: Diagnosis not present

## 2024-07-19 DIAGNOSIS — Z7982 Long term (current) use of aspirin: Secondary | ICD-10-CM | POA: Diagnosis not present

## 2024-07-19 DIAGNOSIS — Z91040 Latex allergy status: Secondary | ICD-10-CM | POA: Diagnosis not present

## 2024-07-19 DIAGNOSIS — R0602 Shortness of breath: Secondary | ICD-10-CM | POA: Diagnosis present

## 2024-07-19 LAB — URINALYSIS, ROUTINE W REFLEX MICROSCOPIC
Bacteria, UA: NONE SEEN
Bilirubin Urine: NEGATIVE
Glucose, UA: 150 mg/dL — AB
Hgb urine dipstick: NEGATIVE
Ketones, ur: NEGATIVE mg/dL
Nitrite: NEGATIVE
Protein, ur: NEGATIVE mg/dL
Specific Gravity, Urine: 1.011 (ref 1.005–1.030)
Squamous Epithelial / HPF: 0 /HPF (ref 0–5)
WBC, UA: >50 WBC/hpf (ref 0–5)
pH: 6 (ref 5.0–8.0)

## 2024-07-19 LAB — BASIC METABOLIC PANEL WITH GFR
Anion gap: 14 (ref 5–15)
BUN: 33 mg/dL — ABNORMAL HIGH (ref 8–23)
CO2: 23 mmol/L (ref 22–32)
Calcium: 9.1 mg/dL (ref 8.9–10.3)
Chloride: 103 mmol/L (ref 98–111)
Creatinine, Ser: 1.65 mg/dL — ABNORMAL HIGH (ref 0.44–1.00)
GFR, Estimated: 29 mL/min — ABNORMAL LOW (ref >60)
Glucose, Bld: 113 mg/dL — ABNORMAL HIGH (ref 70–99)
Potassium: 3.6 mmol/L (ref 3.5–5.1)
Sodium: 140 mmol/L (ref 135–145)

## 2024-07-19 LAB — TROPONIN I (HIGH SENSITIVITY)
Troponin I (High Sensitivity): 53 ng/L — ABNORMAL HIGH (ref <18)
Troponin I (High Sensitivity): 48 ng/L — ABNORMAL HIGH (ref <18)

## 2024-07-19 LAB — RESP PANEL BY RT-PCR (RSV, FLU A&B, COVID)  RVPGX2
Influenza A by PCR: NEGATIVE
Influenza B by PCR: NEGATIVE
Resp Syncytial Virus by PCR: NEGATIVE
SARS Coronavirus 2 by RT PCR: NEGATIVE

## 2024-07-19 LAB — PROCALCITONIN: Procalcitonin: <0.1 ng/mL

## 2024-07-19 LAB — CBC
HCT: 35.1 % — ABNORMAL LOW (ref 36.0–46.0)
Hemoglobin: 11.4 g/dL — ABNORMAL LOW (ref 12.0–15.0)
MCH: 30.7 pg (ref 26.0–34.0)
MCHC: 32.5 g/dL (ref 30.0–36.0)
MCV: 94.6 fL (ref 80.0–100.0)
Platelets: 159 K/uL (ref 150–400)
RBC: 3.71 MIL/uL — ABNORMAL LOW (ref 3.87–5.11)
RDW: 13.5 % (ref 11.5–15.5)
WBC: 10 K/uL (ref 4.0–10.5)
nRBC: 0 % (ref 0.0–0.2)

## 2024-07-19 LAB — HEPATIC FUNCTION PANEL
ALT: 17 U/L (ref 0–44)
AST: 24 U/L (ref 15–41)
Albumin: 3.7 g/dL (ref 3.5–5.0)
Alkaline Phosphatase: 99 U/L (ref 38–126)
Bilirubin, Direct: 0.2 mg/dL (ref 0.0–0.2)
Indirect Bilirubin: 0.6 mg/dL (ref 0.3–0.9)
Total Bilirubin: 0.8 mg/dL (ref 0.0–1.2)
Total Protein: 6.6 g/dL (ref 6.5–8.1)

## 2024-07-19 LAB — TSH: TSH: 1.803 u[IU]/mL (ref 0.350–4.500)

## 2024-07-19 LAB — LACTIC ACID, PLASMA: Lactic Acid, Venous: 0.7 mmol/L (ref 0.5–1.9)

## 2024-07-19 LAB — MAGNESIUM: Magnesium: 1.9 mg/dL (ref 1.7–2.4)

## 2024-07-19 LAB — BRAIN NATRIURETIC PEPTIDE: B Natriuretic Peptide: 589.2 pg/mL — ABNORMAL HIGH (ref 0.0–100.0)

## 2024-07-19 MED ORDER — HYDRALAZINE HCL 50 MG PO TABS
100.0000 mg | ORAL_TABLET | Freq: Three times a day (TID) | ORAL | Status: AC
Start: 2024-07-19 — End: ?
  Administered 2024-07-19 – 2024-07-20 (×2): 100 mg via ORAL
  Filled 2024-07-19 (×3): qty 2

## 2024-07-19 MED ORDER — MELATONIN 5 MG PO TABS
10.0000 mg | ORAL_TABLET | Freq: Every day | ORAL | Status: DC
  Administered 2024-07-19 – 2024-07-20 (×2): 10 mg via ORAL
  Filled 2024-07-19 (×3): qty 2

## 2024-07-19 MED ORDER — FUROSEMIDE 10 MG/ML IJ SOLN
60.0000 mg | Freq: Once | INTRAMUSCULAR | Status: AC
  Administered 2024-07-19: 60 mg via INTRAVENOUS
  Filled 2024-07-19: qty 8

## 2024-07-19 MED ORDER — MIRTAZAPINE 15 MG PO TABS
30.0000 mg | ORAL_TABLET | Freq: Every day | ORAL | Status: DC
  Administered 2024-07-19 – 2024-07-20 (×2): 30 mg via ORAL
  Filled 2024-07-19 (×2): qty 2

## 2024-07-19 MED ORDER — ACETAMINOPHEN 325 MG PO TABS: 650.0000 mg | ORAL_TABLET | ORAL | Status: DC | PRN

## 2024-07-19 MED ORDER — SODIUM CHLORIDE 0.9 % IV SOLN
1.0000 g | INTRAVENOUS | Status: DC
  Administered 2024-07-20 – 2024-07-21 (×2): 1 g via INTRAVENOUS
  Filled 2024-07-19 (×2): qty 10

## 2024-07-19 MED ORDER — SODIUM CHLORIDE 0.9% FLUSH: 3.0000 mL | INTRAVENOUS | Status: DC | PRN

## 2024-07-19 MED ORDER — SODIUM CHLORIDE 0.9 % IV SOLN
1.0000 g | Freq: Once | INTRAVENOUS | Status: AC
  Administered 2024-07-19: 1 g via INTRAVENOUS
  Filled 2024-07-19: qty 10

## 2024-07-19 MED ORDER — ONDANSETRON HCL 4 MG/2ML IJ SOLN: 4.0000 mg | Freq: Four times a day (QID) | INTRAMUSCULAR | Status: DC | PRN

## 2024-07-19 MED ORDER — HEPARIN SODIUM (PORCINE) 5000 UNIT/ML IJ SOLN
5000.0000 [IU] | Freq: Two times a day (BID) | INTRAMUSCULAR | Status: DC
  Administered 2024-07-19 – 2024-07-21 (×4): 5000 [IU] via SUBCUTANEOUS
  Filled 2024-07-19 (×4): qty 1

## 2024-07-19 MED ORDER — SODIUM CHLORIDE 0.9 % IV SOLN: 250.0000 mL | INTRAVENOUS | Status: AC | PRN

## 2024-07-19 MED ORDER — ALLOPURINOL 300 MG PO TABS
300.0000 mg | ORAL_TABLET | Freq: Every day | ORAL | Status: DC
  Filled 2024-07-19: qty 1

## 2024-07-19 MED ORDER — PANTOPRAZOLE SODIUM 40 MG PO TBEC
40.0000 mg | DELAYED_RELEASE_TABLET | Freq: Every day | ORAL | Status: DC
  Administered 2024-07-20 – 2024-07-21 (×2): 40 mg via ORAL
  Filled 2024-07-19 (×2): qty 1

## 2024-07-19 MED ORDER — IRBESARTAN 75 MG PO TABS
75.0000 mg | ORAL_TABLET | Freq: Every day | ORAL | Status: DC
  Filled 2024-07-19: qty 1

## 2024-07-19 MED ORDER — CYCLOBENZAPRINE HCL 10 MG PO TABS
5.0000 mg | ORAL_TABLET | Freq: Three times a day (TID) | ORAL | Status: AC | PRN
Start: 2024-07-19 — End: ?

## 2024-07-19 MED ORDER — SERTRALINE HCL 50 MG PO TABS
25.0000 mg | ORAL_TABLET | Freq: Every day | ORAL | Status: DC
  Administered 2024-07-20 – 2024-07-21 (×2): 25 mg via ORAL
  Filled 2024-07-19 (×2): qty 1

## 2024-07-19 MED ORDER — POTASSIUM CHLORIDE CRYS ER 20 MEQ PO TBCR
40.0000 meq | EXTENDED_RELEASE_TABLET | Freq: Once | ORAL | Status: AC
  Administered 2024-07-19: 40 meq via ORAL
  Filled 2024-07-19: qty 2

## 2024-07-19 MED ORDER — CLOPIDOGREL BISULFATE 75 MG PO TABS
75.0000 mg | ORAL_TABLET | Freq: Every day | ORAL | Status: DC
  Administered 2024-07-20 – 2024-07-21 (×2): 75 mg via ORAL
  Filled 2024-07-19 (×2): qty 1

## 2024-07-19 MED ORDER — CARVEDILOL 3.125 MG PO TABS
3.1250 mg | ORAL_TABLET | Freq: Two times a day (BID) | ORAL | Status: DC
  Administered 2024-07-19: 3.125 mg via ORAL
  Filled 2024-07-19: qty 1

## 2024-07-19 MED ORDER — AMITRIPTYLINE HCL 10 MG PO TABS
20.0000 mg | ORAL_TABLET | Freq: Every day | ORAL | Status: DC
  Administered 2024-07-19 – 2024-07-20 (×2): 20 mg via ORAL
  Filled 2024-07-19 (×2): qty 2

## 2024-07-19 MED ORDER — ALBUTEROL SULFATE (2.5 MG/3ML) 0.083% IN NEBU: 2.5000 mg | INHALATION_SOLUTION | Freq: Four times a day (QID) | RESPIRATORY_TRACT | Status: DC | PRN

## 2024-07-19 MED ORDER — SODIUM CHLORIDE 0.9% FLUSH
3.0000 mL | Freq: Two times a day (BID) | INTRAVENOUS | Status: DC
  Administered 2024-07-19 – 2024-07-21 (×4): 3 mL via INTRAVENOUS

## 2024-07-19 MED ORDER — ISOSORBIDE MONONITRATE ER 60 MG PO TB24
60.0000 mg | ORAL_TABLET | Freq: Two times a day (BID) | ORAL | Status: DC
  Administered 2024-07-20: 60 mg via ORAL
  Filled 2024-07-19: qty 1

## 2024-07-19 MED ORDER — LEVOTHYROXINE SODIUM 125 MCG PO TABS
125.0000 ug | ORAL_TABLET | Freq: Every day | ORAL | Status: DC
  Administered 2024-07-20: 125 ug via ORAL
  Filled 2024-07-19 (×2): qty 1

## 2024-07-19 MED ORDER — FUROSEMIDE 10 MG/ML IJ SOLN
40.0000 mg | Freq: Two times a day (BID) | INTRAMUSCULAR | Status: DC
  Administered 2024-07-19: 40 mg via INTRAVENOUS
  Filled 2024-07-19: qty 4

## 2024-07-19 MED ORDER — AMLODIPINE BESYLATE 5 MG PO TABS
5.0000 mg | ORAL_TABLET | Freq: Every day | ORAL | Status: DC
  Administered 2024-07-20: 5 mg via ORAL
  Filled 2024-07-19: qty 1

## 2024-07-19 MED ORDER — ASPIRIN 81 MG PO TBEC
81.0000 mg | DELAYED_RELEASE_TABLET | Freq: Every day | ORAL | Status: DC
  Administered 2024-07-20 – 2024-07-21 (×2): 81 mg via ORAL
  Filled 2024-07-19 (×2): qty 1

## 2024-07-19 MED ORDER — CARVEDILOL 3.125 MG PO TABS: 3.1250 mg | ORAL_TABLET | Freq: Two times a day (BID) | ORAL | Status: DC

## 2024-07-19 MED ORDER — ATORVASTATIN CALCIUM 20 MG PO TABS
40.0000 mg | ORAL_TABLET | Freq: Every day | ORAL | Status: DC
  Administered 2024-07-20 – 2024-07-21 (×2): 40 mg via ORAL
  Filled 2024-07-19 (×2): qty 2

## 2024-07-19 MED ORDER — GABAPENTIN 100 MG PO CAPS
100.0000 mg | ORAL_CAPSULE | Freq: Every day | ORAL | Status: DC
  Administered 2024-07-19 – 2024-07-20 (×2): 100 mg via ORAL
  Filled 2024-07-19 (×2): qty 1

## 2024-07-19 NOTE — ED Provider Notes (Signed)
 [facility] Provider Note    Event Date/Time   First MD Initiated Contact with Patient 07/19/24 [phone]     (approximate)   History   Respiratory Distress   HPI  Stephanie Charles is a 89 y.o. female who presents to the ED for evaluation of Respiratory Distress   I reviewed the CHF clinic visit from 10/8.  History of LBBB, CKD 2, CAD, PAD and CHF.  EF 45%.  Aspirin  and Plavix  without AC. PCP visit from 2 days ago where she was seen for 1 week of UTI symptoms, started on nitrofurantoin.  Patient presents to the ED due to nausea, chest discomfort, shortness of breath.  Reports starting the nitrofurantoin yesterday, and still has UTI symptoms.  She reports 1 week of frequency and a couple days of dysuria.  EMS reports finding her hypoxic in the low 70s on room air, provided Nitropaste and multiple sprays of nitroglycerin .  Nitropaste remains and she reports feeling better than earlier this morning.   Physical Exam   Triage Vital Signs: ED Triage Vitals  Encounter Vitals Group     BP 07/19/24 0856 (!) 140/72     Girls Systolic BP Percentile --      Girls Diastolic BP Percentile --      Boys Systolic BP Percentile --      Boys Diastolic BP Percentile --      Pulse Rate 07/19/24 0856 93     Resp 07/19/24 0856 (!) 24     Temp 07/19/24 0857 98.1 F (36.7 C)     Temp Source 07/19/24 0856 Oral     SpO2 07/19/24 0856 94 %     Weight 07/19/24 0858 159 lb 13.3 oz (72.5 kg)     Height --      Head Circumference --      Peak Flow --      Pain Score 07/19/24 0857 8     Pain Loc --      Pain Education --      Exclude from Growth Chart --     Most recent vital signs: Vitals:   07/19/24 0856 07/19/24 0857  BP: (!) 140/72   Pulse: 93   Resp: (!) 24   Temp:  98.1 F (36.7 C)  SpO2: 94%     General: Awake, no distress.  CV:  Good peripheral perfusion.  Resp:  Mild tachypnea without distress, no wheezing Abd:  No distention.  Soft and nontender MSK:  No  deformity noted.  Trace bilateral pitting edema Neuro:  No focal deficits appreciated. Other:     ED Results / Procedures / Treatments   Labs (all labs ordered are listed, but only abnormal results are displayed) Labs Reviewed  BASIC METABOLIC PANEL WITH GFR - Abnormal; Notable for the following components:      Result Value   Glucose, Bld 113 (*)    BUN 33 (*)    Creatinine, Ser 1.65 (*)    GFR, Estimated 29 (*)    All other components within normal limits  CBC - Abnormal; Notable for the following components:   RBC 3.71 (*)    Hemoglobin 11.4 (*)    HCT 35.1 (*)    All other components within normal limits  BRAIN NATRIURETIC PEPTIDE - Abnormal; Notable for the following components:   B Natriuretic Peptide 589.2 (*)    All other components within normal limits  URINALYSIS, ROUTINE W REFLEX MICROSCOPIC - Abnormal; Notable for the following components:  Color, Urine YELLOW (*)    APPearance CLOUDY (*)    Glucose, UA 150 (*)    Leukocytes,Ua LARGE (*)    All other components within normal limits  TROPONIN I (HIGH SENSITIVITY) - Abnormal; Notable for the following components:   Troponin I (High Sensitivity) 53 (*)    All other components within normal limits  TROPONIN I (HIGH SENSITIVITY) - Abnormal; Notable for the following components:   Troponin I (High Sensitivity) 48 (*)    All other components within normal limits  CULTURE, BLOOD (SINGLE)  RESP PANEL BY RT-PCR (RSV, FLU A&B, COVID)  [id number]  PROCALCITONIN  LACTIC ACID, PLASMA  MAGNESIUM   HEPATIC FUNCTION PANEL  LACTIC ACID, PLASMA    EKG Sinus rhythm with a rate of 91 bpm, normal axis, left bundle, no STEMI by Sgarbossa criteria  RADIOLOGY 1 view CXR with pulmonary vascular congestion interpreted by me  Official radiology report(s): DG Chest Portable 1 View Result Date: 07/19/2024 EXAM: 1 VIEW(S) XRAY OF THE CHEST 07/19/2024 09:43:00 AM COMPARISON: 05/07/2024 CLINICAL HISTORY: acute hypoxia, suspect pulm  edema. Patient presents with acute hypoxia, suspect pulmonary edema. Patient presents from home due to having severe SOB and back pain. Per EMS pt received nitroglycerin  paste and 3 sprays. FINDINGS: LUNGS AND PLEURA: Low lung volumes. Coarsened interstitial markings. No focal pulmonary opacity. No pulmonary edema. No pleural effusion. No pneumothorax. HEART AND MEDIASTINUM: Mild cardiomegaly. Aortic atherosclerosis. BONES AND SOFT TISSUES: No acute osseous abnormality. IMPRESSION: 1. Coarsened interstitial markings, possibly related to low lung volumes or pulmonary edema. 2. Mild cardiomegaly and aortic atherosclerosis. Electronically signed by: Evalene Coho MD 07/19/2024 09:48 AM EDT RP Workstation: [id number]    PROCEDURES and INTERVENTIONS:  .1-3 Lead EKG Interpretation  Performed by: Claudene Rover, MD Authorized by: Claudene Rover, MD     Interpretation: normal     ECG rate:  92   ECG rate assessment: normal     Rhythm: sinus rhythm     Ectopy: none     Conduction: normal   .Critical Care  Performed by: Claudene Rover, MD Authorized by: Claudene Rover, MD   Critical care provider statement:    Critical care time (minutes):  30   Critical care time was exclusive of:  Separately billable procedures and treating other patients   Critical care was necessary to treat or prevent imminent or life-threatening deterioration of the following conditions:  Respiratory failure   Critical care was time spent personally by me on the following activities:  Development of treatment plan with patient or surrogate, discussions with consultants, evaluation of patient's response to treatment, examination of patient, ordering and review of laboratory studies, ordering and review of radiographic studies, ordering and performing treatments and interventions, pulse oximetry, re-evaluation of patient's condition and review of old charts   Medications  cefTRIAXone  (ROCEPHIN ) 1 g in sodium chloride  0.9 % 100 mL  IVPB (has no administration in time range)  potassium chloride  SA (KLOR-CON  M) CR tablet 40 mEq (40 mEq Oral Given 07/19/24 1040)  furosemide  (LASIX ) injection 60 mg (60 mg Intravenous Given 07/19/24 1040)     IMPRESSION / MDM / ASSESSMENT AND PLAN / ED COURSE  I reviewed the triage vital signs and the nursing notes.  Differential diagnosis includes, but is not limited to, ACS, PTX, PNA, muscle strain/spasm, PE, dissection, anxiety, pleural effusion  {Patient presents with symptoms of an acute illness or injury that is potentially life-threatening.  Patient presents to the ED with signs of acute hypoxic respiratory failure  likely from volume overload requiring medical admission.  Hypoxic requiring nasal cannula, which is new.  Improvement of symptoms with Nitropaste and starting diuresis.  Renal dysfunction near baseline, elevated BNP and troponin.  No signs of STEMI.  No leukocytosis.  Recent diagnosis of outpatient UTI and she reports dysuria, urine with large leukocytes, sent for culture and started on ceftriaxone .  Procalcitonin is low.   Consult medicine for admission.      FINAL CLINICAL IMPRESSION(S) / ED DIAGNOSES   Final diagnoses:  Acute respiratory failure with hypoxia (HCC)     Rx / DC Orders   ED Discharge Orders     None        Note:  This document was prepared using Dragon voice recognition software and may include unintentional dictation errors.   Claudene Rover, MD 07/19/24 [phone]

## 2024-07-19 NOTE — H&P (Signed)
 History and Physical    MYAN LOCATELLI FMW:[id number] DOB: 11-18-1929 DOA: 07/19/2024  PCP: Bertrum Charlie CROME, MD (Confirm with patient/family/NH records and if not entered, this has to be entered at [facility] point of entry) Patient coming from: Home  I have personally briefly reviewed patient's old medical records in [facility]  Chief Complaint: SOB, feeling weak  HPI: Stephanie Charles is a 89 y.o. female with medical history significant of combined chronic HFrEF and HFpEF with LVEF 45-50%, grade 1 diastolic dysfunction, CKD stage IIIa, HTN, hypothyroidism, CAD s/p stenting presented with worsening shortness of breath.  Patient underwent.  Injection Monday when Tuesday, patient woke up with malaise increasing generalized weakness and exertional dyspnea.  No cough no chest pains.  Symptoms have been gradually getting worse.  And last a few night patient could not lie flat, and goes to bathroom a lot.  Family brought patient to PCP yesterday when patient was diagnosed with UTI and started on Macrobid.  This morning, family called EMS.  EMS arrived and found patient blood pressure elevated and patient was given nitroglycerin  paste.  In addition patient was found to be hypoxic with O2 saturation in the low 70s.  Family reported that they have been monitoring patient's ankle edema and weight every day and found there is no significant changes in either.  ED Course: Afebrile, nontachycardic blood pressure 140/60 O2 saturation 98% on 2 L.  Chest x-ray showed pulmonary congestion, blood work showed hemoglobin 11.4 WBC 10.0 BUN 33 creatinine 1.6 compared to baseline 1.6-1.7, K3.6.  Patient was given IV Lasix  60 mg x 1 in the ED and ceftriaxone  x 1 in the ED.  Review of Systems: As per HPI otherwise 14 point review of systems negative.    Past Medical History:  Diagnosis Date   Anemia    C. difficile diarrhea    CHF (congestive heart failure) (HCC) 10/05/2021   Diverticulitis    GERD  (gastroesophageal reflux disease)    Hyperlipidemia    Hypertension    Hypothyroidism    Myocardial infarction (HCC) 08/2017   TIA (transient ischemic attack)    1 approx 2015, 1 approx 2018   Vertigo    last episode several months ago   [name]     Past Surgical History:  Procedure Laterality Date   COLONOSCOPY WITH PROPOFOL  N/A 06/22/2018   Procedure: COLONOSCOPY WITH PROPOFOL ;  Surgeon: Therisa Bi, MD;  Location: [facility] ENDOSCOPY;  Service: Endoscopy;  Laterality: N/A;   CORONARY STENT INTERVENTION N/A 06/07/2019   Procedure: CORONARY STENT INTERVENTION;  Surgeon: Darron Deatrice LABOR, MD;  Location: [facility] INVASIVE CV LAB;  Service: Cardiovascular;  Laterality: N/A;   ESOPHAGOGASTRODUODENOSCOPY (EGD) WITH PROPOFOL  N/A 06/22/2018   Procedure: ESOPHAGOGASTRODUODENOSCOPY (EGD) WITH PROPOFOL ;  Surgeon: Therisa Bi, MD;  Location: [facility] ENDOSCOPY;  Service: Endoscopy;  Laterality: N/A;   KNEE ARTHROSCOPY Right    REPLACEMENT TOTAL KNEE BILATERAL     RIGHT/LEFT HEART CATH AND CORONARY ANGIOGRAPHY N/A 06/07/2019   Procedure: RIGHT/LEFT HEART CATH AND CORONARY ANGIOGRAPHY;  Surgeon: Perla Evalene PARAS, MD;  Location: [facility] INVASIVE CV LAB;  Service: Cardiovascular;  Laterality: N/A;   SHOULDER SURGERY Left      reports that she has never smoked. She has never used smokeless tobacco. She reports that she does not drink alcohol and does not use drugs.  Allergies  Allergen Reactions   Codeine Nausea And Vomiting and Nausea Only   Levofloxacin Other (See Comments)  Mouth sores   Lisinopril Other (See Comments) and Cough    Cough?   Other Itching   Povidone-Iodine Other (See Comments), Rash and Dermatitis    Severe blistering and itchiness. Severe redness   Simvastatin Other (See Comments)    Myalgias   Latex Itching    Family History  Problem Relation Age of Onset   Heart disease Mother    Hypertension Mother    Stroke Mother    Heart disease Father    Breast cancer  Sister    Alzheimer's disease Brother    Heart attack Brother    Stroke Brother    Heart disease Brother    Mental illness Brother        died suicide   Cancer Brother        prostate   Heart disease Brother        atrial fib   Alzheimer's disease Brother    Clotting disorder Daughter    Fibromyalgia Daughter     Prior to Admission medications   Medication Sig Start Date End Date Taking? Authorizing Provider  albuterol  (PROVENTIL ) (2.5 MG/3ML) 0.083% nebulizer solution USE 1 VIAL IN NEBULIZER EVERY 4 HOURS AS NEEDED 01/04/22   Bertrum Charlie CROME, MD  allopurinol  (ZYLOPRIM ) 300 MG tablet Take 300 mg by mouth daily. 10/25/23   [provider]  amitriptyline  (ELAVIL ) 10 MG tablet Take 1 tablet (10 mg total) by mouth at bedtime. Patient taking differently: Take 20 mg by mouth at bedtime. 12/27/22   Gasper Nancyann BRAVO, MD  amLODipine  (NORVASC ) 5 MG tablet Take 1 tablet (5 mg total) by mouth daily. 05/12/24   Laurita Pillion, MD  ASPIRIN  LOW DOSE 81 MG EC tablet NEW PRESCRIPTION REQUEST: TAKE ONE TABLET BY MOUTH EVERY DAY 08/03/21   Bertrum Charlie CROME, MD  atorvastatin  (LIPITOR) 40 MG tablet Take 1 tablet by mouth once daily 03/23/24   Gollan, Timothy J, MD  carvedilol  (COREG ) 3.125 MG tablet Take 1 tablet (3.125 mg total) by mouth 2 (two) times daily with a meal. 11/08/23   Gollan, Evalene PARAS, MD  clopidogrel  (PLAVIX ) 75 MG tablet Take 1 tablet by mouth once daily 04/05/24   Gollan, Timothy J, MD  cyclobenzaprine  (FLEXERIL ) 5 MG tablet Take 5 mg by mouth 3 (three) times daily as needed. 09/13/23   [provider]  empagliflozin (JARDIANCE) 10 MG TABS tablet Take 1 tablet (10 mg total) by mouth daily before breakfast. 07/11/24   Donette Ellouise LABOR, FNP  ferrous sulfate  325 (65 FE) MG tablet Take 325 mg by mouth at bedtime.    [provider]  fluorometholone  (FML) 0.1 % ophthalmic suspension Place 1 drop into both eyes 2 (two) times daily. 01/17/24   [provider]  gabapentin   (NEURONTIN ) 100 MG capsule Take 1 capsule by mouth at bedtime. 11/10/23   [provider]  hydrALAZINE  (APRESOLINE ) 100 MG tablet Take 1 tablet (100 mg total) by mouth 3 (three) times daily. TAKE 1 TABLET BY MOUTH THREE TIMES DAILY FOR HIGH BLOOD PRESSURE 01/13/24   Furth, Cadence H, PA-C  isosorbide  mononitrate (IMDUR ) 60 MG 24 hr tablet Take 1 tablet (60 mg total) by mouth 2 (two) times daily. 11/08/23   Gollan, Timothy J, MD  levothyroxine  (SYNTHROID ) 137 MCG tablet Take 1 tablet (137 mcg total) by mouth daily before breakfast. 04/12/22   Bertrum Charlie CROME, MD  Melatonin 10 MG TABS Take 10 tablets by mouth at bedtime.    [provider]  mirtazapine  (REMERON )  30 MG tablet TAKE 1 TABLET BY MOUTH AT BEDTIME 04/21/22   Bertrum Charlie CROME, MD  nitroGLYCERIN  (NITROSTAT ) 0.4 MG SL tablet Place 1 tablet (0.4 mg total) under the tongue every 5 (five) minutes as needed for chest pain. 04/09/22   Gollan, Timothy J, MD  pantoprazole  (PROTONIX ) 40 MG tablet Take 1 tablet (40 mg total) by mouth daily. 11/08/23   Gollan, Timothy J, MD  potassium chloride  (MICRO-K ) 10 MEQ CR capsule Take 10 mEq by mouth as needed.    [provider]  sertraline  (ZOLOFT ) 25 MG tablet Take 1 tablet by mouth once daily 04/21/22   Bertrum Charlie CROME, MD  torsemide  (DEMADEX ) 20 MG tablet Take 20 mg by mouth as needed.    [provider]  valsartan  (DIOVAN ) 40 MG tablet Take 1 tablet (40 mg total) by mouth daily. 06/28/24   Donette Ellouise LABOR, FNP    Physical Exam: Vitals:   07/19/24 1045 07/19/24 1100 07/19/24 1130 07/19/24 1200  BP: (!) 142/70 (!) 150/66 (!) 143/63 (!) 136/59  Pulse: 81 78 74 68  Resp: 18 20 20 16   Temp:      TempSrc:      SpO2: 98% 97% 99% 99%  Weight:        Constitutional: NAD, calm, comfortable Vitals:   07/19/24 1045 07/19/24 1100 07/19/24 1130 07/19/24 1200  BP: (!) 142/70 (!) 150/66 (!) 143/63 (!) 136/59  Pulse: 81 78 74 68  Resp: 18 20 20 16   Temp:      TempSrc:       SpO2: 98% 97% 99% 99%  Weight:       Eyes: PERRL, lids and conjunctivae normal ENMT: Mucous membranes are moist. Posterior pharynx clear of any exudate or lesions.Normal dentition.  Neck: normal, supple, no masses, no thyromegaly Respiratory: clear to auscultation bilaterally, no wheezing, scattered crackles bilaterally, increasing respiratory effort. No accessory muscle use.  Cardiovascular: Regular rate and rhythm, no murmurs / rubs / gallops. No extremity edema. 2+ pedal pulses. No carotid bruits.  Abdomen: no tenderness, no masses palpated. No hepatosplenomegaly. Bowel sounds positive.  Musculoskeletal: no clubbing / cyanosis. No joint deformity upper and lower extremities. Good ROM, no contractures. Normal muscle tone.  Skin: no rashes, lesions, ulcers. No induration Neurologic: CN 2-12 grossly intact. Sensation intact, DTR normal. Strength 5/5 in all 4.  Psychiatric: Normal judgment and insight. Alert and oriented x 3. Normal mood.    Labs on Admission: I have personally reviewed following labs and imaging studies  CBC: Recent Labs  Lab 07/19/24 0902  WBC 10.0  HGB 11.4*  HCT 35.1*  MCV 94.6  PLT 159   Basic Metabolic Panel: Recent Labs  Lab 07/19/24 0902  NA 140  K 3.6  CL 103  CO2 23  GLUCOSE 113*  BUN 33*  CREATININE 1.65*  CALCIUM  9.1  MG 1.9   GFR: Estimated Creatinine Clearance: 20.5 mL/min (A) (by C-G formula based on SCr of 1.65 mg/dL (H)). Liver Function Tests: Recent Labs  Lab 07/19/24 0902  AST 24  ALT 17  ALKPHOS 99  BILITOT 0.8  PROT 6.6  ALBUMIN  3.7   No results for input(s): LIPASE, AMYLASE in the last 168 hours. No results for input(s): AMMONIA in the last 168 hours. Coagulation Profile: No results for input(s): INR, PROTIME in the last 168 hours. Cardiac Enzymes: No results for input(s): CKTOTAL, CKMB, CKMBINDEX, TROPONINI in the last 168 hours. BNP (last 3 results) No results for input(s): PROBNP in the last  8760 hours. HbA1C: No results for input(s): HGBA1C in the last 72 hours. CBG: No results for input(s): GLUCAP in the last 168 hours. Lipid Profile: No results for input(s): CHOL, HDL, LDLCALC, TRIG, CHOLHDL, LDLDIRECT in the last 72 hours. Thyroid  Function Tests: No results for input(s): TSH, T4TOTAL, FREET4, T3FREE, THYROIDAB in the last 72 hours. Anemia Panel: No results for input(s): VITAMINB12, FOLATE, FERRITIN, TIBC, IRON, RETICCTPCT in the last 72 hours. Urine analysis:    Component Value Date/Time   COLORURINE YELLOW (A) 07/19/2024 1133   APPEARANCEUR CLOUDY (A) 07/19/2024 1133   APPEARANCEUR Clear 07/11/2018 1326   LABSPEC 1.011 07/19/2024 1133   LABSPEC 1.011 01/22/2013 1416   PHURINE 6.0 07/19/2024 1133   GLUCOSEU 150 (A) 07/19/2024 1133   GLUCOSEU Negative 01/22/2013 1416   HGBUR NEGATIVE 07/19/2024 1133   BILIRUBINUR NEGATIVE 07/19/2024 1133   BILIRUBINUR Negative 07/11/2018 1326   BILIRUBINUR Negative 01/22/2013 1416   KETONESUR NEGATIVE 07/19/2024 1133   PROTEINUR NEGATIVE 07/19/2024 1133   UROBILINOGEN 0.2 03/22/2018 1053   NITRITE NEGATIVE 07/19/2024 1133   LEUKOCYTESUR LARGE (A) 07/19/2024 1133   LEUKOCYTESUR 3+ 01/22/2013 1416    Radiological Exams on Admission: DG Chest Portable 1 View Result Date: 07/19/2024 EXAM: 1 VIEW(S) XRAY OF THE CHEST 07/19/2024 09:43:00 AM COMPARISON: 05/07/2024 CLINICAL HISTORY: acute hypoxia, suspect pulm edema. Patient presents with acute hypoxia, suspect pulmonary edema. Patient presents from home due to having severe SOB and back pain. Per EMS pt received nitroglycerin  paste and 3 sprays. FINDINGS: LUNGS AND PLEURA: Low lung volumes. Coarsened interstitial markings. No focal pulmonary opacity. No pulmonary edema. No pleural effusion. No pneumothorax. HEART AND MEDIASTINUM: Mild cardiomegaly. Aortic atherosclerosis. BONES AND SOFT TISSUES: No acute osseous abnormality. IMPRESSION: 1. Coarsened  interstitial markings, possibly related to low lung volumes or pulmonary edema. 2. Mild cardiomegaly and aortic atherosclerosis. Electronically signed by: Evalene Coho MD 07/19/2024 09:48 AM EDT RP Workstation: [id number]    EKG: Independently reviewed.  Sinus, chronic LBBB  Assessment/Plan Principal Problem:   CHF (congestive heart failure) (HCC) Active Problems:   Acute on chronic HFrEF (heart failure with reduced ejection fraction) (HCC)  (please populate well all problems here in Problem List. (For example, if patient is on BP meds at home and you resume or decide to hold them, it is a problem that needs to be her. Same for CAD, COPD, HLD and so on)  Acute on chronic combined HFrEF and HFpEF Hypertensive emergency Acute hypoxic respiratory failure - Improving - Continue IV Lasix  40 mg twice daily - Resume home BP meds including amlodipine , hydralazine , Imdur , ARB Coreg  and spironolactone  UTI - Discontinue Macrobid - Continue ceftriaxone   HTN, refractory - As above  CKD stage IIIa - Volume overloaded, creatinine level stable - IV diuresis as above  Hypothyroidism - Check TSH - Continue Synthroid   Deconditioning - PT evaluation  DVT prophylaxis: Heparin  subcu Code Status: DNR/DNI Family Communication: Daughter at bedside Disposition Plan: Expect less than 2 midnight hospital stay Consults called: None Admission status: Telemetry observation   Cort ONEIDA Mana MD [facility] Pager [phone]  07/19/2024, 12:55 PM

## 2024-07-19 NOTE — ED Triage Notes (Signed)
 Pt arrived via EMS from home due to having severe SOB and back pain. Per EMS pt received nitroglycerin  paste and 3 sprays. EMS applied 15l/min via NRB due to pt having a RA sat in the low 70's. Pt was also a consult for her EKG which now have improved per EMS due to medication. Pt is resting on ED stretcher at this time with 2l via [place]. Pt maintaining sat above 90%.

## 2024-07-20 DIAGNOSIS — I5023 Acute on chronic systolic (congestive) heart failure: Secondary | ICD-10-CM | POA: Diagnosis not present

## 2024-07-20 LAB — BASIC METABOLIC PANEL WITH GFR
Anion gap: 8 (ref 5–15)
Anion gap: 12 (ref 5–15)
BUN: 38 mg/dL — ABNORMAL HIGH (ref 8–23)
BUN: 38 mg/dL — ABNORMAL HIGH (ref 8–23)
CO2: 29 mmol/L (ref 22–32)
CO2: 23 mmol/L (ref 22–32)
Calcium: 8.8 mg/dL — ABNORMAL LOW (ref 8.9–10.3)
Calcium: 8.8 mg/dL — ABNORMAL LOW (ref 8.9–10.3)
Chloride: 102 mmol/L (ref 98–111)
Chloride: 102 mmol/L (ref 98–111)
Creatinine, Ser: 1.99 mg/dL — ABNORMAL HIGH (ref 0.44–1.00)
Creatinine, Ser: 2.11 mg/dL — ABNORMAL HIGH (ref 0.44–1.00)
GFR, Estimated: 23 mL/min — ABNORMAL LOW (ref >60)
GFR, Estimated: 22 mL/min — ABNORMAL LOW (ref >60)
Glucose, Bld: 97 mg/dL (ref 70–99)
Glucose, Bld: 132 mg/dL — ABNORMAL HIGH (ref 70–99)
Potassium: 3.8 mmol/L (ref 3.5–5.1)
Potassium: 3.7 mmol/L (ref 3.5–5.1)
Sodium: 139 mmol/L (ref 135–145)
Sodium: 137 mmol/L (ref 135–145)

## 2024-07-20 MED ADMIN — Carvedilol Tab 3.125 MG: 3.125 mg | ORAL | NDC 00904730561

## 2024-07-20 MED ADMIN — Isosorbide Mononitrate Tab ER 24HR 60 MG: 30 mg | ORAL | NDC 00904645061

## 2024-07-20 MED FILL — Isosorbide Mononitrate Tab ER 24HR 30 MG: 30.0000 mg | ORAL | Qty: 1 | Status: AC

## 2024-07-20 MED FILL — Isosorbide Mononitrate Tab ER 24HR 60 MG: 30.0000 mg | ORAL | Qty: 1 | Status: AC

## 2024-07-20 MED FILL — Carvedilol Tab 3.125 MG: 3.1250 mg | ORAL | Qty: 1 | Status: AC

## 2024-07-20 NOTE — ED Notes (Signed)
Patient eating dinner tray at this time. 

## 2024-07-20 NOTE — Evaluation (Signed)
 Physical Therapy Evaluation Patient Details Name: ZOEIE RITTER MRN: [id number] DOB: 1930/07/29 Today's Date: 07/20/2024  History of Present Illness  presented to ER secondary to progressive SOB; admitted for management of A/C CHF  Clinical Impression  Patient resting in bed upon arrival to room; alert and oriented, follows commands and agreeable to participation with session.  Denies pain; does endorse some improvement in respiratory status since admission. Bilat UE/LE strength and ROM grossly symmetrical and WFL; no focal weakness appreciated.  Able to complete bed mobility with mod indep; sit/stand, basic transfers and gait (120') with RW, cga.  Demonstrates reciprocal stepping pattern with narrowed BOS; mildly antalgic with excessive weight shift to L, but no overt buckling or LOB.  Mild SOB with gait efforts; sats >90% on RA throughout distance Would benefit from skilled PT to address above deficits and promote optimal return to PLOF.; recommend post-acute PT follow up as indicated by interdisciplinary care team.          If plan is discharge home, recommend the following: A little help with walking and/or transfers;A little help with bathing/dressing/bathroom   Can travel by private vehicle        Equipment Recommendations Rolling walker (2 wheels)  Recommendations for Other Services       Functional Status Assessment Patient has had a recent decline in their functional status and demonstrates the ability to make significant improvements in function in a reasonable and predictable amount of time.     Precautions / Restrictions Precautions Precautions: Fall Restrictions Weight Bearing Restrictions Per Provider Order: No      Mobility  Bed Mobility Overal bed mobility: Modified Independent                  Transfers Overall transfer level: Needs assistance Equipment used: Rolling walker (2 wheels) Transfers: Sit to/from Stand Sit to Stand: Contact guard  assist, Min assist           General transfer comment: cuing for hand placement    Ambulation/Gait Ambulation/Gait assistance: Contact guard assist, Min assist Gait Distance (Feet): 120 Feet Assistive device: Rolling walker (2 wheels)         General Gait Details: reciprocal stepping pattern with narrowed BOS; mildly antalgic with excessive weight shift to L, but no overt buckling or LOB.  Mild SOB with gait efforts; sats >90% on RA throughout distance  Stairs            Wheelchair Mobility     Tilt Bed    Modified Rankin (Stroke Patients Only)       Balance Overall balance assessment: Needs assistance Sitting-balance support: Feet supported, No upper extremity supported Sitting balance-Leahy Scale: Good     Standing balance support: Bilateral upper extremity supported Standing balance-Leahy Scale: Fair                               Pertinent Vitals/Pain Pain Assessment Pain Assessment: No/denies pain    Home Living Family/patient expects to be discharged to:: Private residence Living Arrangements: Children Available Help at Discharge: Family;Available 24 hours/day Type of Home: House Home Access: Stairs to enter Entrance Stairs-Rails: None Entrance Stairs-Number of Steps: single threshold step   Home Layout: Able to live on main level with bedroom/bathroom;Two level Home Equipment: Rollator (4 wheels);Cane - single point;Hand held shower head;Shower seat Additional Comments: lives with daughter (retired), granddaughter, and great-grand son (44 yo)    Prior Function Prior Level of  Function : Independent/Modified Independent             Mobility Comments: Occasional use of a rollator initially upon waking but typically does not use an AD with amb, able to amb limited community distances; no home O2; denies fall history within previous six months ADLs Comments: MOD I ADLs and light housework,  family assisting with some IADLs and  transportation     Extremity/Trunk Assessment   Upper Extremity Assessment Upper Extremity Assessment: Overall WFL for tasks assessed    Lower Extremity Assessment Lower Extremity Assessment: Overall WFL for tasks assessed (grossly at least 4/5)       Communication   Communication Communication: No apparent difficulties    Cognition Arousal: Alert Behavior During Therapy: WFL for tasks assessed/performed   PT - Cognitive impairments: No apparent impairments                         Following commands: Intact       Cueing Cueing Techniques: Verbal cues, Gestural cues, Tactile cues     General Comments      Exercises     Assessment/Plan    PT Assessment Patient needs continued PT services  PT Problem List Decreased strength;Decreased range of motion;Decreased balance;Decreased activity tolerance;Decreased mobility;Decreased coordination;Decreased knowledge of use of DME;Decreased safety awareness;Decreased knowledge of precautions       PT Treatment Interventions DME instruction;Gait training;Stair training;Functional mobility training;Therapeutic exercise;Therapeutic activities;Balance training;Patient/family education    PT Goals (Current goals can be found in the Care Plan section)  Acute Rehab PT Goals Patient Stated Goal: to go home PT Goal Formulation: With patient Time For Goal Achievement: 08/03/24 Potential to Achieve Goals: Good    Frequency Min 2X/week     Co-evaluation               AM-PAC PT 6 Clicks Mobility  Outcome Measure Help needed turning from your back to your side while in a flat bed without using bedrails?: None Help needed moving from lying on your back to sitting on the side of a flat bed without using bedrails?: None Help needed moving to and from a bed to a chair (including a wheelchair)?: A Little Help needed standing up from a chair using your arms (e.g., wheelchair or bedside chair)?: A Little Help needed to  walk in hospital room?: A Little Help needed climbing 3-5 steps with a railing? : A Little 6 Click Score: 20    End of Session Equipment Utilized During Treatment: Gait belt Activity Tolerance: Patient tolerated treatment well Patient left: in bed;with call bell/phone within reach;with bed alarm set Nurse Communication: Mobility status PT Visit Diagnosis: Muscle weakness (generalized) (M62.81);Difficulty in walking, not elsewhere classified (R26.2)    Time: 8879-8853 PT Time Calculation (min) (ACUTE ONLY): 26 min   Charges:   PT Evaluation $PT Eval Moderate Complexity: 1 Mod   PT General Charges $$ ACUTE PT VISIT: 1 Visit         Jonell Krontz H. Delores, PT, DPT, NCS 07/20/24, 4:09 PM [phone]

## 2024-07-20 NOTE — Progress Notes (Signed)
 Heart Failure Navigator Progress Note  Assessed for Heart & Vascular TOC clinic readiness.  Patient does not meet criteria due to current Advanced Heart Failure Team patient.  Navigator to follow patient while in the hospital for follow-up at the CHF clinic.  Navigator available for reassessment of patient.   Charmaine Pines, RN, BSN [facility] Heart Failure Navigator Secure Chat Only

## 2024-07-20 NOTE — Hospital Course (Addendum)
 Hospital course / significant events:   Stephanie Charles is a 89 y.o. female with medical history significant of combined chronic HFrEF and HFpEF with LVEF 45-50%, grade 1 diastolic dysfunction, CKD stage IIIa, HTN, hypothyroidism, CAD s/p stenting presented with worsening shortness of breath.   HPI: Tuesday 10/14 noted generalized weakness and exertional dyspnea - worsening, night patient could not lie flat, also urinary frequency  Family brought patient to PCP 10/14 dx UTI tx Macrobid, records reviewed no culture collected.  In morning 10/16, family called EMS d/t SOB - hypertensive, SpO2 low 70s.  Family reported that they have been monitoring patient's ankle edema and weight every day and found there is no significant changes in either.   10/16: to ED. SpO2 impoved to 98% on 2L [place]. CXR pulm congestion, given Lasix  IV x1, ceftriaxone . Admitted to hospitalist. Cr 1.65 about her baseline.  10/17: VSS. Measured 1L UOP. Cr to 1.99, renal fxn worse hold lasix  and other nephrotoxic meds. Pt wanted to go home but renal fxn continued to worsen on afternoon labs, will not release from hospital today     Consultants:  none  Procedures/Surgeries: none      ASSESSMENT & PLAN:   Acute on chronic combined HFrEF and HFpEF CAD w/ hx PCI Hypertensive emergency on essential hypertension Acute hypoxic respiratory failure Improving Echo 04/09/24: LVEF 45-50, LV global hypokinesis, grade 1 diast df hold IV Lasix  w/ AKI Hold Jardiance, ARB and spironolactone w/ AKI holding home amlodipine , hydralazine , losartan  d/t low BP Continue Imdur , coreg  reduced dose d/t low BP   Essential HTN BP labile here  holding home amlodipine , hydralazine , losartan  Continue Imdur , coreg  reduced dose    UTI DC home Macrobid Continue IV ceftriaxone  Already on abx, culture is unlikely to be helpful   AKI on CKD stage IIIa Likely d/t diuresis Hold Lasix  for now Hold other nephrotoxic meds for now: allopurinol , ARB     Hypothyroidism Check TSH Continue Synthroid    Deconditioning PT evaluation    Overweight / Class 1 obesity based on BMI: Body mass index is 29.71 kg/m.SABRA Significantly low or high BMI is associated with higher medical risk.  Underweight - under 18  overweight - 25 to 29 obese - 30 or more Class 1 obesity: BMI of 30.0 to 34 Class 2 obesity: BMI of 35.0 to 39 Class 3 obesity: BMI of 40.0 to 49 Super Morbid Obesity: BMI 50-59 Super-super Morbid Obesity: BMI 60+ Healthy nutrition and physical activity advised as adjunct to other disease management and risk reduction treatments    DVT prophylaxis: heparin  IV fluids: no continuous IV fluids  Nutrition: cardiac Central lines / other devices: none  Code Status: DNR ACP documentation reviewed:  none on file in [facility]  TOC needs: TBD Medical barriers to dispo: AKI, CHF. Expected medical readiness for discharge tomorrow / day after .

## 2024-07-20 NOTE — Care Management Obs Status (Signed)
 MEDICARE OBSERVATION STATUS NOTIFICATION   Patient Details  Name: NAMI STRAWDER MRN: [id number] Date of Birth: 1929/10/11   Medicare Observation Status Notification Given:  Yes    [name] 07/20/2024, 12:03 PM

## 2024-07-20 NOTE — Progress Notes (Signed)
 PROGRESS NOTE    Stephanie Charles   FMW:[id number] DOB: 1930/09/29  DOA: 07/19/2024 Date of Service: 07/20/24 which is hospital day 0  PCP: Bertrum Charlie CROME, MD    Hospital course / significant events:   Stephanie Charles is a 89 y.o. female with medical history significant of combined chronic HFrEF and HFpEF with LVEF 45-50%, grade 1 diastolic dysfunction, CKD stage IIIa, HTN, hypothyroidism, CAD s/p stenting presented with worsening shortness of breath.   HPI: Tuesday 10/14 noted generalized weakness and exertional dyspnea - worsening, night patient could not lie flat, also urinary frequency  Family brought patient to PCP 10/14 dx UTI tx Macrobid, records reviewed no culture collected.  In morning 10/16, family called EMS d/t SOB - hypertensive, SpO2 low 70s.  Family reported that they have been monitoring patient's ankle edema and weight every day and found there is no significant changes in either.   10/16: to ED. SpO2 impoved to 98% on 2L [place]. CXR pulm congestion, given Lasix  IV x1, ceftriaxone . Admitted to hospitalist. Cr 1.65 about her baseline.  10/17: VSS. Measured 1L UOP. Cr to 1.99, renal fxn worse hold lasix  and other nephrotoxic meds. Pt wanted to go home but renal fxn continued to worsen on afternoon labs, will not release from hospital today     Consultants:  none  Procedures/Surgeries: none      ASSESSMENT & PLAN:   Acute on chronic combined HFrEF and HFpEF CAD w/ hx PCI Hypertensive emergency on essential hypertension Acute hypoxic respiratory failure Improving Echo 04/09/24: LVEF 45-50, LV global hypokinesis, grade 1 diast df hold IV Lasix  w/ AKI Hold Jardiance, ARB and spironolactone w/ AKI holding home amlodipine , hydralazine , losartan  d/t low BP Continue Imdur , coreg  reduced dose d/t low BP   Essential HTN BP labile here  holding home amlodipine , hydralazine , losartan  Continue Imdur , coreg  reduced dose    UTI DC home Macrobid Continue IV  ceftriaxone  Already on abx, culture is unlikely to be helpful   AKI on CKD stage IIIa Likely d/t diuresis Hold Lasix  for now Hold other nephrotoxic meds for now: allopurinol , ARB    Hypothyroidism Check TSH Continue Synthroid    Deconditioning PT evaluation    Overweight / Class 1 obesity based on BMI: Body mass index is 29.71 kg/m.SABRA Significantly low or high BMI is associated with higher medical risk.  Underweight - under 18  overweight - 25 to 29 obese - 30 or more Class 1 obesity: BMI of 30.0 to 34 Class 2 obesity: BMI of 35.0 to 39 Class 3 obesity: BMI of 40.0 to 49 Super Morbid Obesity: BMI 50-59 Super-super Morbid Obesity: BMI 60+ Healthy nutrition and physical activity advised as adjunct to other disease management and risk reduction treatments    DVT prophylaxis: heparin  IV fluids: no continuous IV fluids  Nutrition: cardiac Central lines / other devices: none  Code Status: DNR ACP documentation reviewed:  none on file in [facility]  TOC needs: TBD Medical barriers to dispo: AKI, CHF. Expected medical readiness for discharge tomorrow / day after .              Subjective / Brief ROS:  Patient reports feeling better and wants to go home  Denies CP/SOB.  Pain controlled.  Denies new weakness.  Tolerating diet.  Reports no concerns w/ urination/defecation.   Family Communication: called daughter Stephanie Charles no answer left HIPAA compliant voicemail 07/20/24 5:23 PM      Objective Findings:  Vitals:   07/20/24 1525 07/20/24 1600  07/20/24 1630 07/20/24 1700  BP: (!) 116/54 (!) 131/58 (!) 118/49 (!) 124/56  Pulse: 83 79 79 82  Resp: 16 17 20    Temp: 98.7 F (37.1 C)     TempSrc: Oral     SpO2: 95% 94% 93% 94%  Weight:        Intake/Output Summary (Last 24 hours) at 07/20/2024 1720 Last data filed at 07/20/2024 1155 Gross per 24 hour  Intake --  Output 650 ml  Net -650 ml   Filed Weights   07/19/24 0858  Weight: 72.5 kg    Examination:   Physical Exam Constitutional:      General: She is not in acute distress. Cardiovascular:     Rate and Rhythm: Normal rate and regular rhythm.  Pulmonary:     Effort: Pulmonary effort is normal.     Breath sounds: Rales (faint at bases) present.  Musculoskeletal:     Right lower leg: No edema.     Left lower leg: No edema.  Skin:    General: Skin is warm and dry.  Neurological:     General: No focal deficit present.     Mental Status: She is alert and oriented to person, place, and time.          Scheduled Medications:   amitriptyline   20 mg Oral QHS   aspirin  EC  81 mg Oral Daily   atorvastatin   40 mg Oral Daily   carvedilol   3.125 mg Oral BID WC   clopidogrel   75 mg Oral Daily   gabapentin   100 mg Oral QHS   heparin   5,000 Units Subcutaneous Q12H   isosorbide  mononitrate  30 mg Oral BID   levothyroxine   125 mcg Oral QAC breakfast   melatonin  10 mg Oral QHS   mirtazapine   30 mg Oral QHS   pantoprazole   40 mg Oral Daily   sertraline   25 mg Oral Daily   sodium chloride  flush  3 mL Intravenous Q12H    Continuous Infusions:  cefTRIAXone  (ROCEPHIN )  IV Stopped (07/20/24 1155)    PRN Medications:  acetaminophen , albuterol , cyclobenzaprine , ondansetron  (ZOFRAN ) IV, sodium chloride  flush  Antimicrobials from admission:  Anti-infectives (From admission, onward)    Start     Dose/Rate Route Frequency Ordered Stop   07/20/24 1000  cefTRIAXone  (ROCEPHIN ) 1 g in sodium chloride  0.9 % 100 mL IVPB        1 g 200 mL/hr over 30 Minutes Intravenous Every 24 hours 07/19/24 1255     07/19/24 1215  cefTRIAXone  (ROCEPHIN ) 1 g in sodium chloride  0.9 % 100 mL IVPB        1 g 200 mL/hr over 30 Minutes Intravenous  Once 07/19/24 1210 07/19/24 1256           Data Reviewed:  I have personally reviewed the following...  CBC: Recent Labs  Lab 07/19/24 0902  WBC 10.0  HGB 11.4*  HCT 35.1*  MCV 94.6  PLT 159   Basic Metabolic Panel: Recent Labs  Lab 07/19/24 0902  07/20/24 0653 07/20/24 1350  NA 140 139 137  K 3.6 3.8 3.7  CL 103 102 102  CO2 23 29 23   GLUCOSE 113* 97 132*  BUN 33* 38* 38*  CREATININE 1.65* 1.99* 2.11*  CALCIUM  9.1 8.8* 8.8*  MG 1.9  --   --    GFR: Estimated Creatinine Clearance: 16 mL/min (A) (by C-G formula based on SCr of 2.11 mg/dL (H)). Liver Function Tests: Recent Labs  Lab  07/19/24 0902  AST 24  ALT 17  ALKPHOS 99  BILITOT 0.8  PROT 6.6  ALBUMIN  3.7   No results for input(s): LIPASE, AMYLASE in the last 168 hours. No results for input(s): AMMONIA in the last 168 hours. Coagulation Profile: No results for input(s): INR, PROTIME in the last 168 hours. Cardiac Enzymes: No results for input(s): CKTOTAL, CKMB, CKMBINDEX, TROPONINI in the last 168 hours. BNP (last 3 results) No results for input(s): PROBNP in the last 8760 hours. HbA1C: No results for input(s): HGBA1C in the last 72 hours. CBG: No results for input(s): GLUCAP in the last 168 hours. Lipid Profile: No results for input(s): CHOL, HDL, LDLCALC, TRIG, CHOLHDL, LDLDIRECT in the last 72 hours. Thyroid  Function Tests: Recent Labs    07/19/24 1046  TSH 1.803   Anemia Panel: No results for input(s): VITAMINB12, FOLATE, FERRITIN, TIBC, IRON, RETICCTPCT in the last 72 hours. Most Recent Urinalysis On File:     Component Value Date/Time   COLORURINE YELLOW (A) 07/19/2024 1133   APPEARANCEUR CLOUDY (A) 07/19/2024 1133   APPEARANCEUR Clear 07/11/2018 1326   LABSPEC 1.011 07/19/2024 1133   LABSPEC 1.011 01/22/2013 1416   PHURINE 6.0 07/19/2024 1133   GLUCOSEU 150 (A) 07/19/2024 1133   GLUCOSEU Negative 01/22/2013 1416   HGBUR NEGATIVE 07/19/2024 1133   BILIRUBINUR NEGATIVE 07/19/2024 1133   BILIRUBINUR Negative 07/11/2018 1326   BILIRUBINUR Negative 01/22/2013 1416   KETONESUR NEGATIVE 07/19/2024 1133   PROTEINUR NEGATIVE 07/19/2024 1133   UROBILINOGEN 0.2 03/22/2018 1053   NITRITE NEGATIVE  07/19/2024 1133   LEUKOCYTESUR LARGE (A) 07/19/2024 1133   LEUKOCYTESUR 3+ 01/22/2013 1416   Sepsis Labs: @LABRCNTIP (procalcitonin:4,lacticidven:4) Microbiology: Recent Results (from the past 240 hours)  Blood culture (single)     Status: None (Preliminary result)   Collection Time: 07/19/24  9:02 AM   Specimen: BLOOD  Result Value Ref Range Status   Specimen Description BLOOD LEFT ANTECUBITAL  Final   Special Requests   Final    BOTTLES DRAWN AEROBIC AND ANAEROBIC Blood Culture results may not be optimal due to an inadequate volume of blood received in culture bottles   Culture   Final    NO GROWTH < 24 HOURS Performed at [facility], [address], [place], [place] [address]    Report Status PENDING  Incomplete  Resp panel by RT-PCR (RSV, Flu A&B, Covid) Anterior Nasal Swab     Status: None   Collection Time: 07/19/24 11:33 AM   Specimen: Anterior Nasal Swab  Result Value Ref Range Status   SARS Coronavirus 2 by RT PCR NEGATIVE NEGATIVE Final    Comment: (NOTE) SARS-CoV-2 target nucleic acids are NOT DETECTED.  The SARS-CoV-2 RNA is generally detectable in upper respiratory specimens during the acute phase of infection. The lowest concentration of SARS-CoV-2 viral copies this assay can detect is 138 copies/mL. A negative result does not preclude SARS-Cov-2 infection and should not be used as the sole basis for treatment or other patient management decisions. A negative result may occur with  improper specimen collection/handling, submission of specimen other than nasopharyngeal swab, presence of viral mutation(s) within the areas targeted by this assay, and inadequate number of viral copies(<138 copies/mL). A negative result must be combined with clinical observations, patient history, and epidemiological information. The expected result is Negative.  Fact Sheet for Patients:  BloggerCourse.com  Fact Sheet for Healthcare Providers:   SeriousBroker.it  This test is no t yet approved or cleared by the [place]  FDA  and  has been authorized for detection and/or diagnosis of SARS-CoV-2 by FDA under an Emergency Use Authorization (EUA). This EUA will remain  in effect (meaning this test can be used) for the duration of the COVID-19 declaration under Section 564(b)(1) of the Act, 21 U.S.C.section 360bbb-3(b)(1), unless the authorization is terminated  or revoked sooner.       Influenza A by PCR NEGATIVE NEGATIVE Final   Influenza B by PCR NEGATIVE NEGATIVE Final    Comment: (NOTE) The Xpert Xpress SARS-CoV-2/FLU/RSV plus assay is intended as an aid in the diagnosis of influenza from Nasopharyngeal swab specimens and should not be used as a sole basis for treatment. Nasal washings and aspirates are unacceptable for Xpert Xpress SARS-CoV-2/FLU/RSV testing.  Fact Sheet for Patients: BloggerCourse.com  Fact Sheet for Healthcare Providers: SeriousBroker.it  This test is not yet approved or cleared by the [place]  FDA and has been authorized for detection and/or diagnosis of SARS-CoV-2 by FDA under an Emergency Use Authorization (EUA). This EUA will remain in effect (meaning this test can be used) for the duration of the COVID-19 declaration under Section 564(b)(1) of the Act, 21 U.S.C. section 360bbb-3(b)(1), unless the authorization is terminated or revoked.     Resp Syncytial Virus by PCR NEGATIVE NEGATIVE Final    Comment: (NOTE) Fact Sheet for Patients: BloggerCourse.com  Fact Sheet for Healthcare Providers: SeriousBroker.it  This test is not yet approved or cleared by the [place]  FDA and has been authorized for detection and/or diagnosis of SARS-CoV-2 by FDA under an Emergency Use Authorization (EUA). This EUA will remain in effect (meaning this test can be used) for  the duration of the COVID-19 declaration under Section 564(b)(1) of the Act, 21 U.S.C. section 360bbb-3(b)(1), unless the authorization is terminated or revoked.  Performed at [facility], [address], [place], [place] [address]       Radiology Studies last 3 days: DG Chest Portable 1 View Result Date: 07/19/2024 EXAM: 1 VIEW(S) XRAY OF THE CHEST 07/19/2024 09:43:00 AM COMPARISON: 05/07/2024 CLINICAL HISTORY: acute hypoxia, suspect pulm edema. Patient presents with acute hypoxia, suspect pulmonary edema. Patient presents from home due to having severe SOB and back pain. Per EMS pt received nitroglycerin  paste and 3 sprays. FINDINGS: LUNGS AND PLEURA: Low lung volumes. Coarsened interstitial markings. No focal pulmonary opacity. No pulmonary edema. No pleural effusion. No pneumothorax. HEART AND MEDIASTINUM: Mild cardiomegaly. Aortic atherosclerosis. BONES AND SOFT TISSUES: No acute osseous abnormality. IMPRESSION: 1. Coarsened interstitial markings, possibly related to low lung volumes or pulmonary edema. 2. Mild cardiomegaly and aortic atherosclerosis. Electronically signed by: Evalene Coho MD 07/19/2024 09:48 AM EDT RP Workstation: DARYLENE Laneta Blunt, DO [facility] 07/20/2024, 5:20 PM    Dictation software may have been used to generate the above note. Typos may occur and escape review in typed/dictated notes. Please contact Dr Blunt directly for clarity if needed.  Staff may message me via secure chat in Epic  but this may not receive an immediate response,  please page me for urgent matters!  If 7PM-7AM, please contact night coverage [email or web address]

## 2024-07-21 ENCOUNTER — Other Ambulatory Visit: Payer: Self-pay

## 2024-07-21 DIAGNOSIS — I5023 Acute on chronic systolic (congestive) heart failure: Secondary | ICD-10-CM | POA: Diagnosis not present

## 2024-07-21 LAB — BASIC METABOLIC PANEL WITH GFR
Anion gap: 11 (ref 5–15)
BUN: 42 mg/dL — ABNORMAL HIGH (ref 8–23)
CO2: 27 mmol/L (ref 22–32)
Calcium: 8.7 mg/dL — ABNORMAL LOW (ref 8.9–10.3)
Chloride: 98 mmol/L (ref 98–111)
Creatinine, Ser: 2.1 mg/dL — ABNORMAL HIGH (ref 0.44–1.00)
GFR, Estimated: 22 mL/min — ABNORMAL LOW (ref >60)
Glucose, Bld: 115 mg/dL — ABNORMAL HIGH (ref 70–99)
Potassium: 3.8 mmol/L (ref 3.5–5.1)
Sodium: 136 mmol/L (ref 135–145)

## 2024-07-21 LAB — LACTIC ACID, PLASMA: Lactic Acid, Venous: 2.4 mmol/L (ref 0.5–1.9)

## 2024-07-21 MED ORDER — VALSARTAN 40 MG PO TABS: 20.0000 mg | ORAL_TABLET | Freq: Two times a day (BID) | ORAL | Status: DC

## 2024-07-21 MED ORDER — ALLOPURINOL 100 MG PO TABS
100.0000 mg | ORAL_TABLET | Freq: Every day | ORAL | 0 refills | Status: AC
  Filled 2024-07-21: qty 30, 30d supply, fill #0

## 2024-07-21 MED ORDER — TORSEMIDE 10 MG PO TABS
10.0000 mg | ORAL_TABLET | Freq: Every day | ORAL | 0 refills | Status: DC
  Filled 2024-07-21: qty 45, 30d supply, fill #0

## 2024-07-21 MED ADMIN — Isosorbide Mononitrate Tab ER 24HR 30 MG: 30 mg | ORAL | NDC 00904644961

## 2024-07-21 MED ADMIN — Carvedilol Tab 3.125 MG: 3.125 mg | ORAL | NDC 00904730561

## 2024-07-21 MED FILL — Isosorbide Mononitrate Tab ER 24HR 30 MG: 30.0000 mg | ORAL | 30 days supply | Qty: 30 | Fill #0 | Status: AC

## 2024-07-21 NOTE — TOC Transition Note (Signed)
 Transition of Care [facility]) - Discharge Note   Patient Details  Name: Stephanie Charles MRN: [id number] Date of Birth: 07-15-1930  Transition [facility] CM/SW Contact:  [place] L Stefanos Haynesworth, LCSW Phone Number: 07/21/2024, 2:03 PM   Clinical Narrative:     [facility] consult reviewed for Home Health/DME needs. Orders for HHPT and OT entered. CSW reviewed [facility] and found that patient is already active with [facility]. CSW notified [facility] in patient portal that there are new orders for HHPT and OT.   CSW spoke with patient. Patient advised that she has a RW at home.   No further TOC needs.           Patient Goals and CMS Choice            Discharge Placement                       Discharge Plan and Services Additional resources added to the After Visit Summary for                                       Social Drivers of Health (SDOH) Interventions SDOH Screenings   Food Insecurity: No Food Insecurity (05/08/2024)  Housing: Low Risk  (05/08/2024)  Transportation Needs: No Transportation Needs (05/08/2024)  Utilities: Not At Risk (05/08/2024)  Alcohol Screen: Low Risk  (06/03/2022)  Depression (PHQ2-9): Low Risk  (06/03/2022)  Financial Resource Strain: Medium Risk (04/10/2024)  Physical Activity: Inactive (08/18/2020)  Social Connections: Socially Isolated (05/08/2024)  Stress: No Stress Concern Present (08/18/2020)  Tobacco Use: Low Risk  (07/19/2024)     Readmission Risk Interventions    05/09/2024    2:53 PM  Readmission Risk Prevention Plan  Transportation Screening Complete  PCP or Specialist Appt within 3-5 Days Complete  Social Work Consult for Recovery Care Planning/Counseling Complete  Palliative Care Screening Not Applicable  Medication Review Oceanographer) Complete

## 2024-07-21 NOTE — Discharge Instructions (Addendum)
 Home Health orders sent to [facility]. Start of care will be 24 to 48 hours after discharge.

## 2024-07-21 NOTE — Discharge Summary (Signed)
 Physician Discharge Summary   Patient: Stephanie Charles MRN: [id number]  DOB: 12-16-1929   Admit:     Date of Admission: 07/19/2024 Admitted from: home   Discharge: Date of discharge: 07/21/24 Disposition: Home health Condition at discharge: good  CODE STATUS: DNR     Discharge Physician: Laneta Blunt, DO [facility]     PCP: Bertrum Charlie CROME, MD  Recommendations for Outpatient Follow-up:  Follow up with PCP Bertrum Charlie CROME, MD in 1-2 weeks Follow asap with cardiology - needs recheck on new med instructions and needs clearance for upcoming cataract surgery per ophtho   Discharge Instructions     (HEART FAILURE PATIENTS) Call MD:  Anytime you have any of the following symptoms: 1) 3 pound weight gain in 24 hours or 5 pounds in 1 week 2) shortness of breath, with or without a dry hacking cough 3) swelling in the hands, feet or stomach 4) if you have to sleep on extra pillows at night in order to breathe.   Complete by: As directed    Diet - low sodium heart healthy   Complete by: As directed    Increase activity slowly   Complete by: As directed          Discharge Diagnoses: Principal Problem:   CHF (congestive heart failure) (HCC) Active Problems:   Acute on chronic HFrEF (heart failure with reduced ejection fraction) [facility])      Hospital course / significant events:   Stephanie Charles is a 89 y.o. female with medical history significant of combined chronic HFrEF and HFpEF with LVEF 45-50%, grade 1 diastolic dysfunction, CKD stage IIIa, HTN, hypothyroidism, CAD s/p stenting presented with worsening shortness of breath.   HPI: Tuesday 10/14 noted generalized weakness and exertional dyspnea - worsening, night patient could not lie flat, also urinary frequency  Family brought patient to PCP 10/14 dx UTI tx Macrobid, records reviewed no culture collected.  In morning 10/16, family called EMS d/t SOB - hypertensive, SpO2 low 70s.  Family reported  that they have been monitoring patient's ankle edema and weight every day and found there is no significant changes in either.   10/16: to ED. SpO2 impoved to 98% on 2L [place]. CXR pulm congestion, given Lasix  IV x1, ceftriaxone . Admitted to hospitalist. Cr 1.65 about her baseline but this was some time ago.  10/17: VSS. Measured 1L UOP. Cr up / GFR reduced,  10/18: on close chart review, renal baseline appears closer to CKD3b/4. Cr stable from yesterday and close to baseline, pt feeling better today and not needing O2, ambulating well, ok for dc home      Consultants:  none  Procedures/Surgeries: none      ASSESSMENT & PLAN:   Acute on chronic combined HFrEF and HFpEF CAD w/ hx PCI Hypertensive emergency on essential hypertension Acute hypoxic respiratory failure Improving Echo 04/09/24: LVEF 45-50, LV global hypokinesis, grade 1 diast df Change torsemide , reduced dose to 10 mg and instructed for daily use rather than prn, increase prn see med rec - but always make sure to talk to cardiology if she is needing to increase the medication.  DC Jardiance d/t UTI ARB valsartan  resume on discharge holding home amlodipine , hydralazine  d/t soft BP and addition of daily torsemide  rather than prn  Continue Imdur  reduced dose d/t soft BP, no chest pain Continue coreg  Close follow w/ up cardiology Of note, pt states she never started whatever TIna prescribed (Jardiance) because I don't take anything  unless Dr Gollan says so, pt was educated that Tina's recommendations should be followed as she is very qualified to manage cardiac medications, and if any questions please ask her rather than wait to see Dr Perla.   Essential HTN BP labile here  holding home amlodipine , hydralazine , losartan  Continue Imdur , coreg  reduced dose    UTI resume home Macrobid to finish course Already on abx, culture is unlikely to be helpful   CKD stage IIIb / ?IV?  Reduced allopurinol  Caution on diuretic  and ARB --> will need close monitoring BMP and should refer to nephrology    Hypothyroidism Continue Synthroid    Deconditioning PT evaluation - ok for home health     Overweight / Class 1 obesity based on BMI: Body mass index is 29.71 kg/m.SABRA Significantly low or high BMI is associated with higher medical risk.  Underweight - under 18  overweight - 25 to 29 obese - 30 or more Class 1 obesity: BMI of 30.0 to 34 Class 2 obesity: BMI of 35.0 to 39 Class 3 obesity: BMI of 40.0 to 49 Super Morbid Obesity: BMI 50-59 Super-super Morbid Obesity: BMI 60+ Healthy nutrition and physical activity advised as adjunct to other disease management and risk reduction treatments            Discharge Instructions  Allergies as of 07/21/2024       Reactions   Codeine Nausea And Vomiting, Nausea Only   Levofloxacin Other (See Comments)   Mouth sores   Lisinopril Other (See Comments), Cough   Cough?   Other Itching   Povidone-iodine Other (See Comments), Rash, Dermatitis   Severe blistering and itchiness. Severe redness   Simvastatin Other (See Comments)   Myalgias   Latex Itching        Medication List     STOP taking these medications    amLODipine  5 MG tablet Commonly known as: NORVASC    empagliflozin 10 MG Tabs tablet Commonly known as: Jardiance   hydrALAZINE  100 MG tablet Commonly known as: APRESOLINE        TAKE these medications    albuterol  (2.5 MG/3ML) 0.083% nebulizer solution Commonly known as: PROVENTIL  USE 1 VIAL IN NEBULIZER EVERY 4 HOURS AS NEEDED   allopurinol  100 MG tablet Commonly known as: ZYLOPRIM  Take 1 tablet (100 mg total) by mouth daily. What changed:  medication strength how much to take   amitriptyline  10 MG tablet Commonly known as: ELAVIL  Take 1 tablet (10 mg total) by mouth at bedtime. What changed: how much to take   Aspirin  Low Dose 81 MG tablet Generic drug: aspirin  EC NEW PRESCRIPTION REQUEST: TAKE ONE TABLET BY MOUTH  EVERY DAY   atorvastatin  40 MG tablet Commonly known as: LIPITOR Take 1 tablet by mouth once daily   carvedilol  3.125 MG tablet Commonly known as: COREG  Take 1 tablet (3.125 mg total) by mouth 2 (two) times daily with a meal.   clopidogrel  75 MG tablet Commonly known as: PLAVIX  Take 1 tablet by mouth once daily   cyclobenzaprine  5 MG tablet Commonly known as: FLEXERIL  Take 5 mg by mouth 3 (three) times daily as needed.   ferrous sulfate  325 (65 FE) MG tablet Take 325 mg by mouth at bedtime.   fluorometholone  0.1 % ophthalmic suspension Commonly known as: FML Place 1 drop into both eyes 2 (two) times daily.   gabapentin  100 MG capsule Commonly known as: NEURONTIN  Take 1 capsule by mouth at bedtime.   isosorbide  mononitrate 30 MG 24 hr tablet Commonly  known as: IMDUR  Take 1 tablet (30 mg total) by mouth daily. What changed:  medication strength how much to take when to take this   levothyroxine  137 MCG tablet Commonly known as: SYNTHROID  Take 1 tablet (137 mcg total) by mouth daily before breakfast.   Melatonin 10 MG Tabs Take 10 tablets by mouth at bedtime.   mirtazapine  30 MG tablet Commonly known as: REMERON  TAKE 1 TABLET BY MOUTH AT BEDTIME   nitrofurantoin (macrocrystal-monohydrate) 100 MG capsule Commonly known as: MACROBID Take 100 mg by mouth 2 (two) times daily. for 7 days   nitroGLYCERIN  0.4 MG SL tablet Commonly known as: NITROSTAT  Place 1 tablet (0.4 mg total) under the tongue every 5 (five) minutes as needed for chest pain.   pantoprazole  40 MG tablet Commonly known as: PROTONIX  Take 1 tablet (40 mg total) by mouth daily.   potassium chloride  10 MEQ CR capsule Commonly known as: MICRO-K  Take 10 mEq by mouth as needed.   sertraline  25 MG tablet Commonly known as: ZOLOFT  Take 1 tablet by mouth once daily   torsemide  10 MG tablet Commonly known as: DEMADEX  Take 1 tablet (10 mg total) by mouth daily. Increase to 1 tablet (10 mg total) by  mouth TWICE daily (total daily dose 20 mg) as needed for up to 3 days for increased leg swelling, shortness of breath, weight gain 3+ lbs over 1-2 days. Seek medical care if these symptoms are not improving with increased dose. What changed:  medication strength how much to take when to take this reasons to take this additional instructions   valsartan  40 MG tablet Commonly known as: Diovan  Take 0.5 tablets (20 mg total) by mouth 2 (two) times daily. What changed:  how much to take when to take this               Durable Medical Equipment  (From admission, onward)           Start     Ordered   07/21/24 0900  DME Walker  Once       Question Answer Comment  Walker: With 5 Inch Wheels   Patient needs a walker to treat with the following condition CHF (congestive heart failure) (HCC)      07/21/24 0859             Follow-up Information     Bertrum Charlie CROME, MD. Schedule an appointment as soon as possible for a visit.   Specialty: Family Medicine Why: hospital follow up Contact information: [address] [place] [address] [phone]         Perla Evalene PARAS, MD. Schedule an appointment as soon as possible for a visit.   Specialty: Cardiology Why: hospital follow up and surgical clearance asap Contact information: [address] [place] [address] [phone]                 Allergies  Allergen Reactions   Codeine Nausea And Vomiting and Nausea Only   Levofloxacin Other (See Comments)    Mouth sores   Lisinopril Other (See Comments) and Cough    Cough?   Other Itching   Povidone-Iodine Other (See Comments), Rash and Dermatitis    Severe blistering and itchiness. Severe redness   Simvastatin Other (See Comments)    Myalgias   Latex Itching     Subjective: Pt feeling good this morning, no SOB, ambulating well, tolerating diet, no CP/SOB   Discharge Exam: BP (!) 109/47 (BP  Location: Left Arm)   Pulse  65   Temp 97.7 F (36.5 C) (Oral)   Resp (!) 21   Wt 69.4 kg   SpO2 92%   BMI 28.44 kg/m  General: Pt is alert, awake, not in acute distress Cardiovascular: RRR, S1/S2 Respiratory: Cvery faint rales at bases vs atelectasis  Abdominal: Soft, NT, ND, bowel sounds + Extremities: no edema, no cyanosis     The results of significant diagnostics from this hospitalization (including imaging, microbiology, ancillary and laboratory) are listed below for reference.     Microbiology: Recent Results (from the past 240 hours)  Blood culture (single)     Status: None (Preliminary result)   Collection Time: 07/19/24  9:02 AM   Specimen: BLOOD  Result Value Ref Range Status   Specimen Description BLOOD LEFT ANTECUBITAL  Final   Special Requests   Final    BOTTLES DRAWN AEROBIC AND ANAEROBIC Blood Culture results may not be optimal due to an inadequate volume of blood received in culture bottles   Culture   Final    NO GROWTH 2 DAYS Performed at [facility], [address] [place], [place] [address]    Report Status PENDING  Incomplete  Resp panel by RT-PCR (RSV, Flu A&B, Covid) Anterior Nasal Swab     Status: None   Collection Time: 07/19/24 11:33 AM   Specimen: Anterior Nasal Swab  Result Value Ref Range Status   SARS Coronavirus 2 by RT PCR NEGATIVE NEGATIVE Final    Comment: (NOTE) SARS-CoV-2 target nucleic acids are NOT DETECTED.  The SARS-CoV-2 RNA is generally detectable in upper respiratory specimens during the acute phase of infection. The lowest concentration of SARS-CoV-2 viral copies this assay can detect is 138 copies/mL. A negative result does not preclude SARS-Cov-2 infection and should not be used as the sole basis for treatment or other patient management decisions. A negative result may occur with  improper specimen collection/handling, submission of specimen other than nasopharyngeal swab, presence of viral mutation(s) within the areas targeted by this  assay, and inadequate number of viral copies(<138 copies/mL). A negative result must be combined with clinical observations, patient history, and epidemiological information. The expected result is Negative.  Fact Sheet for Patients:  BloggerCourse.com  Fact Sheet for Healthcare Providers:  SeriousBroker.it  This test is no t yet approved or cleared by the [place]  FDA and  has been authorized for detection and/or diagnosis of SARS-CoV-2 by FDA under an Emergency Use Authorization (EUA). This EUA will remain  in effect (meaning this test can be used) for the duration of the COVID-19 declaration under Section 564(b)(1) of the Act, 21 U.S.C.section 360bbb-3(b)(1), unless the authorization is terminated  or revoked sooner.       Influenza A by PCR NEGATIVE NEGATIVE Final   Influenza B by PCR NEGATIVE NEGATIVE Final    Comment: (NOTE) The Xpert Xpress SARS-CoV-2/FLU/RSV plus assay is intended as an aid in the diagnosis of influenza from Nasopharyngeal swab specimens and should not be used as a sole basis for treatment. Nasal washings and aspirates are unacceptable for Xpert Xpress SARS-CoV-2/FLU/RSV testing.  Fact Sheet for Patients: BloggerCourse.com  Fact Sheet for Healthcare Providers: SeriousBroker.it  This test is not yet approved or cleared by the [place]  FDA and has been authorized for detection and/or diagnosis of SARS-CoV-2 by FDA under an Emergency Use Authorization (EUA). This EUA will remain in effect (meaning this test can be used) for the duration of the COVID-19 declaration under  Section 564(b)(1) of the Act, 21 U.S.C. section 360bbb-3(b)(1), unless the authorization is terminated or revoked.     Resp Syncytial Virus by PCR NEGATIVE NEGATIVE Final    Comment: (NOTE) Fact Sheet for Patients: BloggerCourse.com  Fact Sheet for  Healthcare Providers: SeriousBroker.it  This test is not yet approved or cleared by the [place]  FDA and has been authorized for detection and/or diagnosis of SARS-CoV-2 by FDA under an Emergency Use Authorization (EUA). This EUA will remain in effect (meaning this test can be used) for the duration of the COVID-19 declaration under Section 564(b)(1) of the Act, 21 U.S.C. section 360bbb-3(b)(1), unless the authorization is terminated or revoked.  Performed at [facility], [address] [place], [place] [address]      Labs: BNP (last 3 results) Recent Labs    04/08/24 0536 05/07/24 2205 07/19/24 0902  BNP 359.3* 585.4* 589.2*   Basic Metabolic Panel: Recent Labs  Lab 07/19/24 0902 07/20/24 0653 07/20/24 1350 07/21/24 0539  NA 140 139 137 136  K 3.6 3.8 3.7 3.8  CL 103 102 102 98  CO2 23 29 23 27   GLUCOSE 113* 97 132* 115*  BUN 33* 38* 38* 42*  CREATININE 1.65* 1.99* 2.11* 2.10*  CALCIUM  9.1 8.8* 8.8* 8.7*  MG 1.9  --   --   --    Liver Function Tests: Recent Labs  Lab 07/19/24 0902  AST 24  ALT 17  ALKPHOS 99  BILITOT 0.8  PROT 6.6  ALBUMIN  3.7   No results for input(s): LIPASE, AMYLASE in the last 168 hours. No results for input(s): AMMONIA in the last 168 hours. CBC: Recent Labs  Lab 07/19/24 0902  WBC 10.0  HGB 11.4*  HCT 35.1*  MCV 94.6  PLT 159   Cardiac Enzymes: No results for input(s): CKTOTAL, CKMB, CKMBINDEX, TROPONINI in the last 168 hours. BNP: Invalid input(s): POCBNP CBG: No results for input(s): GLUCAP in the last 168 hours. D-Dimer No results for input(s): DDIMER in the last 72 hours. Hgb A1c No results for input(s): HGBA1C in the last 72 hours. Lipid Profile No results for input(s): CHOL, HDL, LDLCALC, TRIG, CHOLHDL, LDLDIRECT in the last 72 hours. Thyroid  function studies Recent Labs    07/19/24 1046  TSH 1.803   Anemia work up No results for  input(s): VITAMINB12, FOLATE, FERRITIN, TIBC, IRON, RETICCTPCT in the last 72 hours. Urinalysis    Component Value Date/Time   COLORURINE YELLOW (A) 07/19/2024 1133   APPEARANCEUR CLOUDY (A) 07/19/2024 1133   APPEARANCEUR Clear 07/11/2018 1326   LABSPEC 1.011 07/19/2024 1133   LABSPEC 1.011 01/22/2013 1416   PHURINE 6.0 07/19/2024 1133   GLUCOSEU 150 (A) 07/19/2024 1133   GLUCOSEU Negative 01/22/2013 1416   HGBUR NEGATIVE 07/19/2024 1133   BILIRUBINUR NEGATIVE 07/19/2024 1133   BILIRUBINUR Negative 07/11/2018 1326   BILIRUBINUR Negative 01/22/2013 1416   KETONESUR NEGATIVE 07/19/2024 1133   PROTEINUR NEGATIVE 07/19/2024 1133   UROBILINOGEN 0.2 03/22/2018 1053   NITRITE NEGATIVE 07/19/2024 1133   LEUKOCYTESUR LARGE (A) 07/19/2024 1133   LEUKOCYTESUR 3+ 01/22/2013 1416   Sepsis Labs Recent Labs  Lab 07/19/24 0902  WBC 10.0   Microbiology Recent Results (from the past 240 hours)  Blood culture (single)     Status: None (Preliminary result)   Collection Time: 07/19/24  9:02 AM   Specimen: BLOOD  Result Value Ref Range Status   Specimen Description BLOOD LEFT ANTECUBITAL  Final   Special Requests   Final  BOTTLES DRAWN AEROBIC AND ANAEROBIC Blood Culture results may not be optimal due to an inadequate volume of blood received in culture bottles   Culture   Final    NO GROWTH 2 DAYS Performed at [facility], [address] [place], [place] [address]    Report Status PENDING  Incomplete  Resp panel by RT-PCR (RSV, Flu A&B, Covid) Anterior Nasal Swab     Status: None   Collection Time: 07/19/24 11:33 AM   Specimen: Anterior Nasal Swab  Result Value Ref Range Status   SARS Coronavirus 2 by RT PCR NEGATIVE NEGATIVE Final    Comment: (NOTE) SARS-CoV-2 target nucleic acids are NOT DETECTED.  The SARS-CoV-2 RNA is generally detectable in upper respiratory specimens during the acute phase of infection. The lowest concentration of SARS-CoV-2 viral  copies this assay can detect is 138 copies/mL. A negative result does not preclude SARS-Cov-2 infection and should not be used as the sole basis for treatment or other patient management decisions. A negative result may occur with  improper specimen collection/handling, submission of specimen other than nasopharyngeal swab, presence of viral mutation(s) within the areas targeted by this assay, and inadequate number of viral copies(<138 copies/mL). A negative result must be combined with clinical observations, patient history, and epidemiological information. The expected result is Negative.  Fact Sheet for Patients:  BloggerCourse.com  Fact Sheet for Healthcare Providers:  SeriousBroker.it  This test is no t yet approved or cleared by the [place]  FDA and  has been authorized for detection and/or diagnosis of SARS-CoV-2 by FDA under an Emergency Use Authorization (EUA). This EUA will remain  in effect (meaning this test can be used) for the duration of the COVID-19 declaration under Section 564(b)(1) of the Act, 21 U.S.C.section 360bbb-3(b)(1), unless the authorization is terminated  or revoked sooner.       Influenza A by PCR NEGATIVE NEGATIVE Final   Influenza B by PCR NEGATIVE NEGATIVE Final    Comment: (NOTE) The Xpert Xpress SARS-CoV-2/FLU/RSV plus assay is intended as an aid in the diagnosis of influenza from Nasopharyngeal swab specimens and should not be used as a sole basis for treatment. Nasal washings and aspirates are unacceptable for Xpert Xpress SARS-CoV-2/FLU/RSV testing.  Fact Sheet for Patients: BloggerCourse.com  Fact Sheet for Healthcare Providers: SeriousBroker.it  This test is not yet approved or cleared by the [place]  FDA and has been authorized for detection and/or diagnosis of SARS-CoV-2 by FDA under an Emergency Use Authorization (EUA). This  EUA will remain in effect (meaning this test can be used) for the duration of the COVID-19 declaration under Section 564(b)(1) of the Act, 21 U.S.C. section 360bbb-3(b)(1), unless the authorization is terminated or revoked.     Resp Syncytial Virus by PCR NEGATIVE NEGATIVE Final    Comment: (NOTE) Fact Sheet for Patients: BloggerCourse.com  Fact Sheet for Healthcare Providers: SeriousBroker.it  This test is not yet approved or cleared by the [place]  FDA and has been authorized for detection and/or diagnosis of SARS-CoV-2 by FDA under an Emergency Use Authorization (EUA). This EUA will remain in effect (meaning this test can be used) for the duration of the COVID-19 declaration under Section 564(b)(1) of the Act, 21 U.S.C. section 360bbb-3(b)(1), unless the authorization is terminated or revoked.  Performed at [facility], [address] [place], [place] [address]    Imaging DG Chest Portable 1 View Result Date: 07/19/2024 EXAM: 1 VIEW(S) XRAY OF THE CHEST 07/19/2024 09:43:00 AM COMPARISON: 05/07/2024 CLINICAL HISTORY:  acute hypoxia, suspect pulm edema. Patient presents with acute hypoxia, suspect pulmonary edema. Patient presents from home due to having severe SOB and back pain. Per EMS pt received nitroglycerin  paste and 3 sprays. FINDINGS: LUNGS AND PLEURA: Low lung volumes. Coarsened interstitial markings. No focal pulmonary opacity. No pulmonary edema. No pleural effusion. No pneumothorax. HEART AND MEDIASTINUM: Mild cardiomegaly. Aortic atherosclerosis. BONES AND SOFT TISSUES: No acute osseous abnormality. IMPRESSION: 1. Coarsened interstitial markings, possibly related to low lung volumes or pulmonary edema. 2. Mild cardiomegaly and aortic atherosclerosis. Electronically signed by: Evalene Coho MD 07/19/2024 09:48 AM EDT RP Workstation: [id number]      Time coordinating discharge: over 30  minutes  SIGNED:  Ankit Degregorio DO [facility]

## 2024-07-23 ENCOUNTER — Ambulatory Visit: Admitting: Cardiovascular Disease

## 2024-07-24 LAB — CULTURE, BLOOD (SINGLE): Culture: NO GROWTH

## 2024-07-26 ENCOUNTER — Telehealth: Payer: Self-pay | Admitting: Cardiovascular Disease

## 2024-07-26 NOTE — Telephone Encounter (Signed)
   Patient Name: Stephanie Charles  DOB: 02/02/1930 MRN: [id number]  Primary Cardiologist: Evalene Lunger, MD  Chart reviewed as part of pre-operative protocol coverage. Cataract extractions are recognized in guidelines as low risk surgeries that do not typically require specific preoperative testing or holding of blood thinner therapy. Therefore, given past medical history and time since last visit, based on ACC/AHA guidelines, Stephanie Charles would be at acceptable risk for the planned procedure without further cardiovascular testing.   I will route this recommendation to the requesting party via Epic fax function and remove from pre-op pool.  Please call with questions.  Damien JAYSON Braver, NP 07/26/2024, 3:50 PM

## 2024-07-26 NOTE — Telephone Encounter (Signed)
   Pre-operative Risk Assessment    Patient Name: Stephanie Charles  DOB: 07/30/1930 MRN: [id number]   Date of last office visit: 02/17/24 Date of next office visit: 07/30/24   Request for Surgical Clearance    Procedure:  cataract surgery  Date of Surgery:  Clearance TBD                                Surgeon:  Dr Elsie Carmine Surgeon's Group or Practice Name:  [facility] Phone number:  [phone] Fax number:  [phone]   Type of Clearance Requested:   - Medical    Type of Anesthesia:  Not Indicated   Additional requests/questions:    Bonney Rosina Stamps   07/26/2024, 2:44 PM

## 2024-07-29 NOTE — Progress Notes (Unsigned)
 Cardiology Office Note  Date:  07/30/2024   ID:  TESSIA KASSIN, DOB 10-10-1929, MRN [id number]  PCP:  Bertrum Charlie CROME, MD   Chief Complaint  Patient presents with   Follow-up    Patient was at [facility] ER on 07/19/2024 CHF. Patient c/o shortness of breath & bilateral LE edema. Patient was taken off Jardiance while at the hospital on 07/19/2024. Patient would like to discuss medications.     HPI:  [name] is a 89 y.o. female with history of  demand ischemia with hypertensive urgency,  LBBB,  TIA/CVA in 2016, CKD stage III,  HTN,  HLD,  carotid artery disease,  hypothyroidism,  anxiety, depression,  Coronary disease, stent placed to proximal to mid LAD ejection fraction 35 to 40%, repeat 09/2019: 40 to 45% ECHO: January 2023 ejection fraction 40% Ejection fraction normalized October 2024, EF 55 to 60% Who presents for follow-up of her ischemic cardiomyopathy, PAD  Last seen in clinic by myself 5/25 Also seen by one of our providers  April 2025  In the hospital October 16 until July 21, 2024 Presented with shortness of breath, weakness, urinary tract infection Could not lay flat, hypoxia Jardiance discontinued secondary to UTI Amlodipine  hydralazine  Jardiance held Was continued on Imdur , reduced dose carvedilol , torsemide  10 daily  Lab work showing creatinine 2.1  Echocardiogram July 2025 EF 45 to 50%  Presents today in wheelchair, with daughter, Denies significant shortness of breath Weight after getting home from the hospital 145 pounds, weight range since that time at home 143 to 145 pounds Weight in the office today 146 pounds Reports so far she is tolerating 10 daily Trace ankle swelling Sits with her legs down but stays active during the daytime Denies significant abdominal distention or shortness of breath  Renal artery US  showed no blockages in the renal arteries   Denies significant chest pain concerning for angina  EKG personally reviewed by  myself on todays visit EKG Interpretation Date/Time:  Monday July 30 2024 16:28:14 EDT Ventricular Rate:  86 PR Interval:  246 QRS Duration:  154 QT Interval:  414 QTC Calculation: 495 R Axis:   -16  Text Interpretation: Sinus rhythm with 1st degree A-V block with occasional Premature ventricular complexes Left bundle branch block When compared with ECG of 19-Jul-2024 08:57, No significant change was found Confirmed by Perla Lye [phone]) on 07/30/2024 4:34:21 PM   Past medical history reviewed hospital October 2024 non-STEMI, CHF Presented to the hospital with chest pain, hypertension VQ scan with no PE  echo detailing ejection fraction 50 to 55% Was treated with heparin  infusion 48 hours Was felt not a candidate for ischemic workup given age, debility, medical management recommended Entresto , torsemide  Coreg  held at discharge, beta-blocker held for bradycardia   C.diff better , no recent diverticulitis after long course of antibiotics 02/2022  Covid, in hospital,March 06 2021 BP elevated on arrival to the hospital Creatinine 0.9 on arrival,  Treated with diuretic for shortness of breath, concern for CHF Sent home 03/10/2021,d/c with CR 1.6, BUN 92, above her baseline  After discharge, had episode of syncope on toilet in the setting of diarrhea on 03/11/21 Presented to the hospital again with dehydration multiple episodes of profuse watery diarrhea bradycardic into the 40s, given 0.5 mg of atropine  in the ER Creatinine 2.12, BUN 100 Given IV fluids  hospital September 2020, ejection fraction 35 to 40% She had symptoms of chest pain and accelerated HTN found to have elevated high sensitivity troponin peaking  at 296 and acute systolic CHF.    cardiac catheterization, with severe proximal to mid LAD disease, stent placed  Follow-up echocardiogram December 2020 ejection fraction 40 up to 45% Tolerating Entresto , carvedilol , HCTZ Reports blood pressure stable at home  PMH:    has a past medical history of Anemia, C. difficile diarrhea, CHF (congestive heart failure) (HCC) (10/05/2021), Diverticulitis, GERD (gastroesophageal reflux disease), Hyperlipidemia, Hypertension, Hypothyroidism, Myocardial infarction (HCC) (08/2017), TIA (transient ischemic attack), Vertigo, and [name].  PSH:    Past Surgical History:  Procedure Laterality Date   COLONOSCOPY WITH PROPOFOL  N/A 06/22/2018   Procedure: COLONOSCOPY WITH PROPOFOL ;  Surgeon: Therisa Bi, MD;  Location: [facility] ENDOSCOPY;  Service: Endoscopy;  Laterality: N/A;   CORONARY STENT INTERVENTION N/A 06/07/2019   Procedure: CORONARY STENT INTERVENTION;  Surgeon: Darron Deatrice LABOR, MD;  Location: [facility] INVASIVE CV LAB;  Service: Cardiovascular;  Laterality: N/A;   ESOPHAGOGASTRODUODENOSCOPY (EGD) WITH PROPOFOL  N/A 06/22/2018   Procedure: ESOPHAGOGASTRODUODENOSCOPY (EGD) WITH PROPOFOL ;  Surgeon: Therisa Bi, MD;  Location: [facility] ENDOSCOPY;  Service: Endoscopy;  Laterality: N/A;   KNEE ARTHROSCOPY Right    REPLACEMENT TOTAL KNEE BILATERAL     RIGHT/LEFT HEART CATH AND CORONARY ANGIOGRAPHY N/A 06/07/2019   Procedure: RIGHT/LEFT HEART CATH AND CORONARY ANGIOGRAPHY;  Surgeon: Perla Evalene PARAS, MD;  Location: [facility] INVASIVE CV LAB;  Service: Cardiovascular;  Laterality: N/A;   SHOULDER SURGERY Left     Current Outpatient Medications  Medication Sig Dispense Refill   albuterol  (PROVENTIL ) (2.5 MG/3ML) 0.083% nebulizer solution USE 1 VIAL IN NEBULIZER EVERY 4 HOURS AS NEEDED 180 mL 6   allopurinol  (ZYLOPRIM ) 100 MG tablet Take 1 tablet (100 mg total) by mouth daily. 30 tablet 0   amitriptyline  (ELAVIL ) 10 MG tablet Take 1 tablet (10 mg total) by mouth at bedtime. (Patient taking differently: Take 20 mg by mouth at bedtime.) 90 tablet 3   ASPIRIN  LOW DOSE 81 MG EC tablet NEW PRESCRIPTION REQUEST: TAKE ONE TABLET BY MOUTH EVERY DAY 90 tablet 3   atorvastatin  (LIPITOR) 40 MG tablet Take 1 tablet by mouth once daily 90  tablet 3   carvedilol  (COREG ) 3.125 MG tablet Take 1 tablet (3.125 mg total) by mouth 2 (two) times daily with a meal. 180 tablet 3   clopidogrel  (PLAVIX ) 75 MG tablet Take 1 tablet by mouth once daily 90 tablet 3   cyclobenzaprine  (FLEXERIL ) 5 MG tablet Take 5 mg by mouth 3 (three) times daily as needed.     ferrous sulfate  325 (65 FE) MG tablet Take 325 mg by mouth at bedtime.     fluorometholone  (FML) 0.1 % ophthalmic suspension Place 1 drop into both eyes 2 (two) times daily.     gabapentin  (NEURONTIN ) 100 MG capsule Take 1 capsule by mouth at bedtime.     isosorbide  mononitrate (IMDUR ) 30 MG 24 hr tablet Take 1 tablet (30 mg total) by mouth daily. 30 tablet 0   levothyroxine  (SYNTHROID ) 137 MCG tablet Take 1 tablet (137 mcg total) by mouth daily before breakfast. 90 tablet 3   Melatonin 10 MG TABS Take 10 tablets by mouth at bedtime.     mirtazapine  (REMERON ) 30 MG tablet TAKE 1 TABLET BY MOUTH AT BEDTIME 90 tablet 3   nitroGLYCERIN  (NITROSTAT ) 0.4 MG SL tablet Place 1 tablet (0.4 mg total) under the tongue every 5 (five) minutes as needed for chest pain. 25 tablet 3   pantoprazole  (PROTONIX ) 40 MG tablet Take 1 tablet (40 mg total) by  mouth daily. 90 tablet 3   potassium chloride  (MICRO-K ) 10 MEQ CR capsule Take 10 mEq by mouth as needed.     sertraline  (ZOLOFT ) 25 MG tablet Take 1 tablet by mouth once daily 90 tablet 3   valsartan  (DIOVAN ) 40 MG tablet Take 0.5 tablets (20 mg total) by mouth 2 (two) times daily.     torsemide  (DEMADEX ) 10 MG tablet Take 1 tablet (10 mg total) by mouth daily. May take extra torsemide  10 mg as needed for weight 148 pounds. For weight 142 or less, please take torsemide  every other day. 90 tablet 3   No current facility-administered medications for this visit.    Allergies:   Codeine, Levofloxacin, Lisinopril, Other, Povidone-iodine, Simvastatin, and Latex   Social History:  The patient  reports that she has never smoked. She has never used smokeless  tobacco. She reports that she does not drink alcohol and does not use drugs.   Family History:   family history includes Alzheimer's disease in her brother and brother; Breast cancer in her sister; Cancer in her brother; Clotting disorder in her daughter; Fibromyalgia in her daughter; Heart attack in her brother; Heart disease in her brother, brother, father, and mother; Hypertension in her mother; Mental illness in her brother; Stroke in her brother and mother.   Review of Systems: Review of Systems  Constitutional: Negative.   HENT: Negative.    Respiratory: Negative.    Cardiovascular: Negative.   Gastrointestinal: Negative.   Musculoskeletal: Negative.   Neurological: Negative.   Psychiatric/Behavioral: Negative.    All other systems reviewed and are negative.  PHYSICAL EXAM: VS:  BP (!) 140/80 (BP Location: Left Arm, Patient Position: Sitting, Cuff Size: Normal)   Pulse 86   Ht 5' 1 (1.549 m)   Wt 146 lb 4 oz (66.3 kg)   SpO2 92%   BMI 27.63 kg/m  , BMI Body mass index is 27.63 kg/m. Constitutional:  oriented to person, place, and time. No distress.  In a wheelchair HENT:  Head: Grossly normal Eyes:  no discharge. No scleral icterus.  Neck: No JVD, no carotid bruits  Cardiovascular: Regular rate and rhythm, no murmurs appreciated Pulmonary/Chest: Clear to auscultation bilaterally, no wheezes or rales Abdominal: Soft.  no distension.  no tenderness.  Musculoskeletal: Normal range of motion Neurological:  normal muscle tone. Coordination normal. No atrophy Skin: Skin warm and dry Psychiatric: normal affect, pleasant  Recent Labs: 07/19/2024: ALT 17; B Natriuretic Peptide 589.2; Hemoglobin 11.4; Magnesium  1.9; Platelets 159; TSH 1.803 07/21/2024: BUN 42; Creatinine, Ser 2.10; Potassium 3.8; Sodium 136   Lipid Panel Lab Results  Component Value Date   CHOL 73 04/09/2024   HDL 26 (L) 04/09/2024   LDLCALC 31 04/09/2024   TRIG 80 04/09/2024    Wt Readings from Last 3  Encounters:  07/30/24 146 lb 4 oz (66.3 kg)  07/21/24 153 lb (69.4 kg)  07/11/24 151 lb 3.2 oz (68.6 kg)     ASSESSMENT AND PLAN:  Problem List Items Addressed This Visit       Cardiology Problems   HFrEF (heart failure with reduced ejection fraction) (HCC)   Relevant Medications   torsemide  (DEMADEX ) 10 MG tablet   Ischemic cardiomyopathy   Relevant Medications   torsemide  (DEMADEX ) 10 MG tablet   Other Relevant Orders   EKG 12-Lead (Completed)   Carotid stenosis   Relevant Medications   torsemide  (DEMADEX ) 10 MG tablet     Other   History of embolic stroke without residual  deficits   Absolute anemia   Other Visit Diagnoses       Coronary artery disease involving native coronary artery of native heart without angina pectoris    -  Primary   Relevant Medications   torsemide  (DEMADEX ) 10 MG tablet   Other Relevant Orders   EKG 12-Lead (Completed)     Chronic systolic heart failure (HCC)       Relevant Medications   torsemide  (DEMADEX ) 10 MG tablet   Other Relevant Orders   EKG 12-Lead (Completed)     Primary hypertension       Relevant Medications   torsemide  (DEMADEX ) 10 MG tablet   Other Relevant Orders   EKG 12-Lead (Completed)     Hyperlipidemia LDL goal <70       Relevant Medications   torsemide  (DEMADEX ) 10 MG tablet     Stage 3 chronic kidney disease, unspecified whether stage 3a or 3b CKD (HCC)         Labile hypertension       Relevant Medications   torsemide  (DEMADEX ) 10 MG tablet       Ischemic cardiomyopathy On valsartan  20 twice daily carvedilol  3.125 twice daily,  Isosorbide  30 daily, torsemide  10 daily - Blood pressure mildly elevated, recommend she monitor blood pressure at home and call us  with numbers  Chronic diastolic and systolic  CHF Recommend she continue torsemide  10 daily, extra torsemide  for weight 148 pounds or higher Decrease torsemide  down to every other day for weight 142 pounds  CAD with stable angina History of stent  placed to her proximal to mid LAD Ejection fraction has normalized Hospitalization October 2024 non-STEMI, decision made to manage medically Continue isosorbide , aspirin  and statin Currently with no symptoms of angina. No further workup at this time. Continue current medication regimen.  Carotid bruit Mild disease bilaterally, less than 39%  Hyperlipidemia On Lipitor 40 daily, goal LDL less than 70  Chronic renal sufficiency stage III Creatinine elevated, around 2 Hour lab draw station is closed with today, consider BMP with Dr. Bertrum on next visit  Essential hypertension Recommend she monitor blood pressure at home and call us  with numbers For high blood pressure we may need to restart low-dose hydralazine  25 mg 3 times daily  Signed, Velinda Lunger, M.D., Ph.D. [facility] [place], [place] [phone]

## 2024-07-30 ENCOUNTER — Encounter: Payer: Self-pay | Admitting: Cardiovascular Disease

## 2024-07-30 ENCOUNTER — Ambulatory Visit: Attending: Cardiovascular Disease | Admitting: Cardiovascular Disease

## 2024-07-30 VITALS — BP 140/80 | HR 86 | Ht 61.0 in | Wt 146.2 lb

## 2024-07-30 DIAGNOSIS — D500 Iron deficiency anemia secondary to blood loss (chronic): Secondary | ICD-10-CM

## 2024-07-30 DIAGNOSIS — I5020 Unspecified systolic (congestive) heart failure: Secondary | ICD-10-CM

## 2024-07-30 DIAGNOSIS — I255 Ischemic cardiomyopathy: Secondary | ICD-10-CM

## 2024-07-30 DIAGNOSIS — I2510 Atherosclerotic heart disease of native coronary artery without angina pectoris: Secondary | ICD-10-CM

## 2024-07-30 DIAGNOSIS — Z8673 Personal history of transient ischemic attack (TIA), and cerebral infarction without residual deficits: Secondary | ICD-10-CM

## 2024-07-30 DIAGNOSIS — I10 Essential (primary) hypertension: Secondary | ICD-10-CM | POA: Diagnosis not present

## 2024-07-30 DIAGNOSIS — I5022 Chronic systolic (congestive) heart failure: Secondary | ICD-10-CM | POA: Diagnosis not present

## 2024-07-30 DIAGNOSIS — E785 Hyperlipidemia, unspecified: Secondary | ICD-10-CM

## 2024-07-30 DIAGNOSIS — N183 Chronic kidney disease, stage 3 unspecified: Secondary | ICD-10-CM

## 2024-07-30 DIAGNOSIS — R0989 Other specified symptoms and signs involving the circulatory and respiratory systems: Secondary | ICD-10-CM

## 2024-07-30 DIAGNOSIS — I6523 Occlusion and stenosis of bilateral carotid arteries: Secondary | ICD-10-CM

## 2024-07-30 MED ORDER — TORSEMIDE 10 MG PO TABS: 10.0000 mg | ORAL_TABLET | Freq: Every day | ORAL | 3 refills | Status: DC

## 2024-07-30 NOTE — Patient Instructions (Addendum)
 Check blood pressure at lunch (12-1 pm)  Medication Instructions:  Take extra torsemide  10 mg as needed for weight 148 pounds  For weight 142 or less, please take torsemide  every other day.  If you need a refill on your cardiac medications before your next appointment, please call your pharmacy.   Lab work: No new labs needed  Testing/Procedures: No new testing needed  Follow-Up: At [facility], you and your health needs are our priority.  As part of our continuing mission to provide you with exceptional heart care, we have created designated Provider Care Teams.  These Care Teams include your primary Cardiologist (physician) and Advanced Practice Providers (APPs -  Physician Assistants and Nurse Practitioners) who all work together to provide you with the care you need, when you need it.  You will need a follow up appointment in 6 months  Providers on your designated Care Team:   Lonni Meager, NP Bernardino Bring, PA-C Cadence [place], [place]  COVID-19 Vaccine Information can be found at: [email or web address] For questions related to vaccine distribution or appointments, please email [email or web address] or call [phone].

## 2024-08-02 ENCOUNTER — Encounter: Payer: Self-pay | Admitting: Ophthalmology

## 2024-08-02 NOTE — Anesthesia Preprocedure Evaluation (Addendum)
 Anesthesia Evaluation  Patient identified by MRN, date of birth, ID band Patient awake    Reviewed: Allergy & Precautions, H&P , NPO status , Patient's Chart, lab work & pertinent test results  Airway Mallampati: III  TM Distance: <3 FB Neck ROM: Full   Comment: Very short TMD Dental no notable dental hx. (+) Upper Dentures, Lower Dentures   Pulmonary neg pulmonary ROS, shortness of breath, pneumonia   Pulmonary exam normal breath sounds clear to auscultation       Cardiovascular hypertension, + angina  + CAD, + Past MI and +CHF  Normal cardiovascular exam+ dysrhythmias  Rhythm:Regular Rate:Normal  06-07-19 cath  Prox LAD lesion is 90% stenosed.  Prox Cx to Mid Cx lesion is 40% stenosed.  Prox RCA lesion is 30% stenosed.             Mid LAD 95% stenosed.  Left Anterior Descending Mid LAD lesion is 95% stenosed. The lesion is type C and located at the major branch. The lesion was not previously treated.  First Diagonal Branch Vessel is small in size.  Second Diagonal Branch Vessel is large in size. 2nd Diag lesion is 30% stenosed.  Intervention  Mid LAD lesion Stent   EKG Interpretation Date/Time:                  Monday July 30 2024 16:28:14 EDT Ventricular Rate:         86 PR Interval:                 246 QRS Duration:             154 QT Interval:                 414 QTC Calculation:495 R Axis:                         -16   Text Interpretation:Sinus rhythm with 1st degree A-V block with occasional Premature ventricular complexes Left bundle branch block When compared with ECG of 19-Jul-2024 08:57, No significant change was found Confirmed by Perla Lye [phone]) on 07/30/2024 4:34:21 PM    04-09-24  1. Left ventricular ejection fraction, by estimation, is 45 to 50%. The  left ventricle has mildly decreased function. The left ventricle  demonstrates global hypokinesis. There is moderate concentric left   ventricular hypertrophy. Left ventricular  diastolic parameters are consistent with Grade I diastolic dysfunction  (impaired relaxation).   2. Right ventricular systolic function is normal. The right ventricular  size is normal.   3. Left atrial size was mildly dilated.   4. Right atrial size was mildly dilated.   5. The mitral valve is grossly normal. Trivial mitral valve  regurgitation.   6. The aortic valve is normal in structure. Aortic valve regurgitation is  not visualized.       Neuro/Psych  PSYCHIATRIC DISORDERS Anxiety Depression    TIAnegative neurological ROS  negative psych ROS   GI/Hepatic negative GI ROS, Neg liver ROS,GERD  ,,  Endo/Other  negative endocrine ROSHypothyroidism    Renal/GU Renal diseasenegative Renal ROS  negative genitourinary   Musculoskeletal negative musculoskeletal ROS (+) Arthritis ,    Abdominal   Peds negative pediatric ROS (+)  Hematology negative hematology ROS (+) Blood dyscrasia, anemia   Anesthesia Other Findings Hypertension  Hypothyroidism Myocardial infarction (HCC)  GERD (gastroesophageal reflux disease) Hyperlipidemia  Diverticulitis Vertigo  [name] TIA (transient ischemic attack)  Anemia CHF (congestive heart failure) (HCC) C. difficile diarrhea LBBB (left bundle branch block)  Carotid artery disease Anxiety  Depression CAD (coronary artery disease)  H/O heart artery stent First degree AV block  Syncope Grade I diastolic dysfunction  Chronic kidney disease, stage 3a     Reproductive/Obstetrics negative OB ROS                              Anesthesia Physical Anesthesia Plan  ASA: 3  Anesthesia Plan: MAC   Post-op Pain Management:    Induction: Intravenous  PONV Risk Score and Plan:   Airway Management Planned: Natural Airway and Nasal Cannula  Additional Equipment:   Intra-op Plan:   Post-operative Plan:   Informed Consent: I have  reviewed the patients History and Physical, chart, labs and discussed the procedure including the risks, benefits and alternatives for the proposed anesthesia with the patient or authorized representative who has indicated his/her understanding and acceptance.     Dental Advisory Given  Plan Discussed with: Anesthesiologist, CRNA and Surgeon  Anesthesia Plan Comments: (Patient consented for risks of anesthesia including but not limited to:  - adverse reactions to medications - damage to eyes, teeth, lips or other oral mucosa - nerve damage due to positioning  - sore throat or hoarseness - Damage to heart, brain, nerves, lungs, other parts of body or loss of life  Patient voiced understanding and assent.)         Anesthesia Quick Evaluation

## 2024-08-06 ENCOUNTER — Other Ambulatory Visit: Payer: Self-pay

## 2024-08-10 ENCOUNTER — Encounter: Payer: Self-pay | Admitting: Ophthalmology

## 2024-08-10 NOTE — Discharge Instructions (Signed)

## 2024-08-13 ENCOUNTER — Encounter: Payer: Self-pay | Admitting: Ophthalmology

## 2024-08-14 ENCOUNTER — Ambulatory Visit
Admission: RE | Admit: 2024-08-14 | Discharge: 2024-08-14 | Disposition: A | Discharge status: Home / Self Care | Attending: Ophthalmology | Admitting: Ophthalmology

## 2024-08-14 ENCOUNTER — Ambulatory Visit: Payer: Self-pay | Admitting: Anesthesiology

## 2024-08-14 ENCOUNTER — Encounter: Payer: Self-pay | Admitting: Ophthalmology

## 2024-08-14 ENCOUNTER — Encounter
Admission: RE | Disposition: A | Discharge status: Home / Self Care | Payer: Self-pay | Source: Home / Self Care | Attending: Ophthalmology

## 2024-08-14 ENCOUNTER — Other Ambulatory Visit: Payer: Self-pay

## 2024-08-14 ENCOUNTER — Telehealth: Payer: Self-pay | Admitting: Family

## 2024-08-14 DIAGNOSIS — N1831 Chronic kidney disease, stage 3a: Secondary | ICD-10-CM | POA: Diagnosis not present

## 2024-08-14 DIAGNOSIS — Z8673 Personal history of transient ischemic attack (TIA), and cerebral infarction without residual deficits: Secondary | ICD-10-CM | POA: Diagnosis not present

## 2024-08-14 DIAGNOSIS — F419 Anxiety disorder, unspecified: Secondary | ICD-10-CM | POA: Insufficient documentation

## 2024-08-14 DIAGNOSIS — I25119 Atherosclerotic heart disease of native coronary artery with unspecified angina pectoris: Secondary | ICD-10-CM | POA: Insufficient documentation

## 2024-08-14 DIAGNOSIS — I130 Hypertensive heart and chronic kidney disease with heart failure and stage 1 through stage 4 chronic kidney disease, or unspecified chronic kidney disease: Secondary | ICD-10-CM | POA: Diagnosis not present

## 2024-08-14 DIAGNOSIS — F32A Depression, unspecified: Secondary | ICD-10-CM | POA: Diagnosis not present

## 2024-08-14 DIAGNOSIS — I509 Heart failure, unspecified: Secondary | ICD-10-CM | POA: Insufficient documentation

## 2024-08-14 DIAGNOSIS — I252 Old myocardial infarction: Secondary | ICD-10-CM | POA: Insufficient documentation

## 2024-08-14 DIAGNOSIS — E039 Hypothyroidism, unspecified: Secondary | ICD-10-CM | POA: Diagnosis not present

## 2024-08-14 DIAGNOSIS — D631 Anemia in chronic kidney disease: Secondary | ICD-10-CM | POA: Insufficient documentation

## 2024-08-14 DIAGNOSIS — H2512 Age-related nuclear cataract, left eye: Secondary | ICD-10-CM | POA: Diagnosis present

## 2024-08-14 DIAGNOSIS — K219 Gastro-esophageal reflux disease without esophagitis: Secondary | ICD-10-CM | POA: Diagnosis not present

## 2024-08-14 HISTORY — PX: CATARACT EXTRACTION W/PHACO: SHX586

## 2024-08-14 HISTORY — DX: Syncope and collapse: R55

## 2024-08-14 HISTORY — DX: Anxiety disorder, unspecified: F41.9

## 2024-08-14 HISTORY — DX: Presence of coronary angioplasty implant and graft: Z95.5

## 2024-08-14 HISTORY — DX: Depression, unspecified: F32.A

## 2024-08-14 HISTORY — DX: Left bundle-branch block, unspecified: I44.7

## 2024-08-14 HISTORY — DX: Disorder of arteries and arterioles, unspecified: I77.9

## 2024-08-14 HISTORY — DX: Atrioventricular block, first degree: I44.0

## 2024-08-14 HISTORY — DX: Atherosclerotic heart disease of native coronary artery without angina pectoris: I25.10

## 2024-08-14 HISTORY — DX: Chronic kidney disease, stage 3a: N18.31

## 2024-08-14 SURGERY — PHACOEMULSIFICATION, CATARACT, WITH IOL INSERTION
Anesthesia: Monitor Anesthesia Care | Site: Eye | Laterality: Left

## 2024-08-14 MED ORDER — CYCLOPENTOLATE HCL 2 % OP SOLN
OPHTHALMIC | Status: AC
  Filled 2024-08-14: qty 2

## 2024-08-14 MED ORDER — BRIMONIDINE TARTRATE-TIMOLOL 0.2-0.5 % OP SOLN
OPHTHALMIC | Status: DC | PRN
  Administered 2024-08-14: 1 [drp] via OPHTHALMIC

## 2024-08-14 MED ORDER — PHENYLEPHRINE HCL 10 % OP SOLN
OPHTHALMIC | Status: AC
  Filled 2024-08-14: qty 5

## 2024-08-14 MED ORDER — SIGHTPATH DOSE#1 NA CHONDROIT SULF-NA HYALURON 40-17 MG/ML IO SOLN
INTRAOCULAR | Status: DC | PRN
  Administered 2024-08-14: 1 mL via INTRAOCULAR

## 2024-08-14 MED ORDER — TETRACAINE HCL 0.5 % OP SOLN
OPHTHALMIC | Status: AC
  Filled 2024-08-14: qty 4

## 2024-08-14 MED ORDER — LIDOCAINE HCL (PF) 2 % IJ SOLN
INTRAOCULAR | Status: DC | PRN
  Administered 2024-08-14: 2 mL

## 2024-08-14 MED ORDER — SIGHTPATH DOSE#1 BSS IO SOLN
INTRAOCULAR | Status: DC | PRN
  Administered 2024-08-14: 85 mL via OPHTHALMIC

## 2024-08-14 MED ORDER — LACTATED RINGERS IV SOLN: INTRAVENOUS | Status: DC

## 2024-08-14 MED ORDER — SIGHTPATH DOSE#1 BSS IO SOLN
INTRAOCULAR | Status: DC | PRN
  Administered 2024-08-14: 15 mL via INTRAOCULAR

## 2024-08-14 MED ORDER — PHENYLEPHRINE HCL 10 % OP SOLN
1.0000 [drp] | OPHTHALMIC | Status: AC
  Administered 2024-08-14 (×3): 1 [drp] via OPHTHALMIC

## 2024-08-14 MED ORDER — MOXIFLOXACIN HCL 0.5 % OP SOLN
OPHTHALMIC | Status: DC | PRN
  Administered 2024-08-14: .2 mL

## 2024-08-14 MED ORDER — TETRACAINE HCL 0.5 % OP SOLN
1.0000 [drp] | OPHTHALMIC | Status: DC | PRN
  Administered 2024-08-14 (×3): 1 [drp] via OPHTHALMIC

## 2024-08-14 MED ORDER — CYCLOPENTOLATE HCL 2 % OP SOLN
1.0000 [drp] | OPHTHALMIC | Status: AC
  Administered 2024-08-14 (×3): 1 [drp] via OPHTHALMIC

## 2024-08-14 SURGICAL SUPPLY — 11 items
CANNULA ANT/CHMB 27G (MISCELLANEOUS) ×1 IMPLANT
CYSTOTOME ANGL RVRS SHRT 25G (CUTTER) ×1 IMPLANT
FEE CATARACT SUITE SIGHTPATH (MISCELLANEOUS) ×1 IMPLANT
GLOVE BIOGEL PI IND STRL 8 (GLOVE) ×1 IMPLANT
GLOVE SURG LX STRL 8.0 MICRO (GLOVE) ×1 IMPLANT
GLOVE SURG SYN 6.5 PF PI BL (GLOVE) ×1 IMPLANT
LENS IOL TECNIS EYHANCE 22.5 (Intraocular Lens) IMPLANT
NDL FILTER BLUNT 18X1 1/2 (NEEDLE) ×1 IMPLANT
NEEDLE FILTER BLUNT 18X1 1/2 (NEEDLE) ×1 IMPLANT
RING MALYGIN (MISCELLANEOUS) IMPLANT
SYR 3ML LL SCALE MARK (SYRINGE) ×1 IMPLANT

## 2024-08-14 NOTE — Anesthesia Postprocedure Evaluation (Signed)
 Anesthesia Post Note  Patient: Stephanie Charles  Procedure(s) Performed: PHACOEMULSIFICATION, CATARACT, WITH IOL INSERTION 34.50 02:34.7 (Left: Eye)  Patient location during evaluation: PACU Anesthesia Type: MAC Level of consciousness: awake and alert Pain management: pain level controlled Vital Signs Assessment: post-procedure vital signs reviewed and stable Respiratory status: spontaneous breathing, nonlabored ventilation, respiratory function stable and patient connected to nasal cannula oxygen  Cardiovascular status: stable and blood pressure returned to baseline Postop Assessment: no apparent nausea or vomiting Anesthetic complications: no   No notable events documented.   Last Vitals:  Vitals:   08/14/24 0902 08/14/24 0908  BP: (!) 158/79 (!) 158/92  Pulse: 73 76  Resp: 19 (!) 21  Temp: 36.7 C 36.5 C  SpO2: 94% 96%    Last Pain:  Vitals:   08/14/24 0908  TempSrc:   PainSc: 0-No pain                 [name] C [name]

## 2024-08-14 NOTE — H&P (Signed)
 [facility]   Primary Care Physician:  Bertrum Charlie CROME, MD Ophthalmologist: Dr. Elsie Carmine  Pre-Procedure History & Physical: HPI:  Stephanie Charles is a 89 y.o. female here for cataract surgery.   Past Medical History:  Diagnosis Date   Anemia    Anxiety    C. difficile diarrhea    CAD (coronary artery disease)    Carotid artery disease    CHF (congestive heart failure) (HCC) 10/05/2021   Chronic kidney disease, stage 3a (HCC)    Depression    Diverticulitis    First degree AV block    GERD (gastroesophageal reflux disease)    Grade I diastolic dysfunction 04/09/2024   H/O heart artery stent    Coronary disease, stent placed to proximal to mid LAD   Hyperlipidemia    Hypertension    Hypothyroidism    LBBB (left bundle branch block)    Myocardial infarction (HCC) 08/2017   Syncope    TIA (transient ischemic attack)    1 approx 2015, 1 approx 2018   Vertigo    last episode several months ago   [name]     Past Surgical History:  Procedure Laterality Date   COLONOSCOPY WITH PROPOFOL  N/A 06/22/2018   Procedure: COLONOSCOPY WITH PROPOFOL ;  Surgeon: Therisa Bi, MD;  Location: [facility] ENDOSCOPY;  Service: Endoscopy;  Laterality: N/A;   CORONARY STENT INTERVENTION N/A 06/07/2019   Procedure: CORONARY STENT INTERVENTION;  Surgeon: Darron Deatrice LABOR, MD;  Location: [facility] INVASIVE CV LAB;  Service: Cardiovascular;  Laterality: N/A;   ESOPHAGOGASTRODUODENOSCOPY (EGD) WITH PROPOFOL  N/A 06/22/2018   Procedure: ESOPHAGOGASTRODUODENOSCOPY (EGD) WITH PROPOFOL ;  Surgeon: Therisa Bi, MD;  Location: [facility] ENDOSCOPY;  Service: Endoscopy;  Laterality: N/A;   KNEE ARTHROSCOPY Right    REPLACEMENT TOTAL KNEE BILATERAL     RIGHT/LEFT HEART CATH AND CORONARY ANGIOGRAPHY N/A 06/07/2019   Procedure: RIGHT/LEFT HEART CATH AND CORONARY ANGIOGRAPHY;  Surgeon: Perla Evalene PARAS, MD;  Location: [facility] INVASIVE CV LAB;  Service: Cardiovascular;  Laterality: N/A;   SHOULDER  SURGERY Left     Prior to Admission medications   Medication Sig Start Date End Date Taking? Authorizing Provider  albuterol  (PROVENTIL ) (2.5 MG/3ML) 0.083% nebulizer solution USE 1 VIAL IN NEBULIZER EVERY 4 HOURS AS NEEDED 01/04/22  Yes Bertrum Charlie CROME, MD  allopurinol  (ZYLOPRIM ) 100 MG tablet Take 1 tablet (100 mg total) by mouth daily. 07/21/24  Yes Alexander, Natalie, DO  amitriptyline  (ELAVIL ) 10 MG tablet Take 1 tablet (10 mg total) by mouth at bedtime. Patient taking differently: Take 20 mg by mouth at bedtime. 12/27/22  Yes Gasper Nancyann BRAVO, MD  ASPIRIN  LOW DOSE 81 MG EC tablet NEW PRESCRIPTION REQUEST: TAKE ONE TABLET BY MOUTH EVERY DAY 08/03/21  Yes Bertrum Charlie CROME, MD  atorvastatin  (LIPITOR) 40 MG tablet Take 1 tablet by mouth once daily 03/23/24  Yes Gollan, Timothy J, MD  carvedilol  (COREG ) 3.125 MG tablet Take 1 tablet (3.125 mg total) by mouth 2 (two) times daily with a meal. 11/08/23  Yes Gollan, Timothy J, MD  clopidogrel  (PLAVIX ) 75 MG tablet Take 1 tablet by mouth once daily 04/05/24  Yes Gollan, Timothy J, MD  cyclobenzaprine  (FLEXERIL ) 5 MG tablet Take 5 mg by mouth 3 (three) times daily as needed. 09/13/23  Yes [provider]  ferrous sulfate  325 (65 FE) MG tablet Take 325 mg by mouth at bedtime.   Yes [provider]  fluorometholone  (FML) 0.1 % ophthalmic suspension Place 1  drop into both eyes 2 (two) times daily. 01/17/24  Yes [provider]  gabapentin  (NEURONTIN ) 100 MG capsule Take 1 capsule by mouth at bedtime. 11/10/23  Yes [provider]  isosorbide  mononitrate (IMDUR ) 30 MG 24 hr tablet Take 1 tablet (30 mg total) by mouth daily. 07/21/24  Yes Alexander, Natalie, DO  levothyroxine  (SYNTHROID ) 137 MCG tablet Take 1 tablet (137 mcg total) by mouth daily before breakfast. 04/12/22  Yes Bertrum Charlie CROME, MD  Melatonin 10 MG TABS Take 10 tablets by mouth at bedtime.   Yes [provider]  mirtazapine  (REMERON ) 30 MG tablet  TAKE 1 TABLET BY MOUTH AT BEDTIME 04/21/22  Yes Bertrum Charlie CROME, MD  nitroGLYCERIN  (NITROSTAT ) 0.4 MG SL tablet Place 1 tablet (0.4 mg total) under the tongue every 5 (five) minutes as needed for chest pain. 04/09/22  Yes Gollan, Timothy J, MD  pantoprazole  (PROTONIX ) 40 MG tablet Take 1 tablet (40 mg total) by mouth daily. 11/08/23  Yes Gollan, Timothy J, MD  potassium chloride  (MICRO-K ) 10 MEQ CR capsule Take 10 mEq by mouth as needed.   Yes [provider]  sertraline  (ZOLOFT ) 25 MG tablet Take 1 tablet by mouth once daily 04/21/22  Yes Bertrum Charlie CROME, MD  torsemide  (DEMADEX ) 10 MG tablet Take 1 tablet (10 mg total) by mouth daily. May take extra torsemide  10 mg as needed for weight 148 pounds. For weight 142 or less, please take torsemide  every other day. 07/30/24  Yes Gollan, Timothy J, MD  valsartan  (DIOVAN ) 40 MG tablet Take 0.5 tablets (20 mg total) by mouth 2 (two) times daily. 07/21/24  Yes Alexander, Natalie, DO    Allergies as of 08/01/2024 - Review Complete 07/30/2024  Allergen Reaction Noted   Codeine Nausea And Vomiting and Nausea Only 04/08/2014   Levofloxacin Other (See Comments) 03/11/2015   Lisinopril Other (See Comments) and Cough 06/14/2017   Other Itching 11/09/2022   Povidone-iodine Other (See Comments), Rash, and Dermatitis 09/22/2017   Simvastatin Other (See Comments) 03/11/2015   Latex Itching 06/08/2019    Family History  Problem Relation Age of Onset   Heart disease Mother    Hypertension Mother    Stroke Mother    Heart disease Father    Breast cancer Sister    Alzheimer's disease Brother    Heart attack Brother    Stroke Brother    Heart disease Brother    Mental illness Brother        died suicide   Cancer Brother        prostate   Heart disease Brother        atrial fib   Alzheimer's disease Brother    Clotting disorder Daughter    Fibromyalgia Daughter     Social History   Socioeconomic History   Marital status: Divorced     Spouse name: na   Number of children: 4   Years of education: 14   Highest education level: Tax Adviser degree: occupational, scientist, product/process development, or vocational program  Occupational History   Occupation: retired  Tobacco Use   Smoking status: Never   Smokeless tobacco: Never  Vaping Use   Vaping status: Never Used  Substance and Sexual Activity   Alcohol use: No    Alcohol/week: 0.0 standard drinks of alcohol   Drug use: No   Sexual activity: Never  Other Topics Concern   Not on file  Social History Narrative   Not on file   Social Drivers of Health  Financial Resource Strain: Low Risk  (07/31/2024)   Received from [facility]   Overall Financial Resource Strain (CARDIA)    Difficulty of Paying Living Expenses: Not hard at all  Food Insecurity: No Food Insecurity (07/31/2024)   Received [facility]   Hunger Vital Sign    Within the past 12 months, you worried that your food would run out before you got the money to buy more.: Never true    Within the past 12 months, the food you bought just didn't last and you didn't have money to get more.: Never true  Transportation Needs: No Transportation Needs (07/31/2024)   Received from [facility] - Transportation    In the past 12 months, has lack of transportation kept you from medical appointments or from getting medications?: No    Lack of Transportation (Non-Medical): No  Physical Activity: Inactive (07/31/2024)   Received from [facility]   Exercise Vital Sign    On average, how many days per week do you engage in moderate to strenuous exercise (like a brisk walk)?: 0 days    On average, how many minutes do you engage in exercise at this level?: 0 min  Stress: No Stress Concern Present (08/18/2020)   [employer]ational [employer] - Occupational Stress Questionnaire    Feeling of Stress : Not at all  Social Connections: Socially Isolated  (05/08/2024)   Social Connection and Isolation Panel    Frequency of Communication with Friends and Family: Never    Frequency of Social Gatherings with Friends and Family: More than three times a week    Attends Religious Services: Never    Database Administrator or Organizations: No    Attends Banker Meetings: Never    Marital Status: Divorced  Catering Manager Violence: Not At Risk (05/08/2024)   Humiliation, Afraid, Rape, and Kick questionnaire    Fear of Current or Ex-Partner: No    Emotionally Abused: No    Physically Abused: No    Sexually Abused: No    Review of Systems: See HPI, otherwise negative ROS  Physical Exam: BP (!) 164/85   Pulse 85   Temp 97.6 F (36.4 C) (Temporal)   Resp 19   Ht 5' 1 (1.549 m)   Wt 66.2 kg   SpO2 94%   BMI 27.59 kg/m  General:   Alert, cooperative. Head:  Normocephalic and atraumatic. Respiratory:  Normal work of breathing. Cardiovascular:  NAD  Impression/Plan: Stephanie Charles is here for cataract surgery.  Risks, benefits, limitations, and alternatives regarding cataract surgery have been reviewed with the patient.  Questions have been answered.  All parties agreeable.   Elsie Carmine, MD  08/14/2024, 8:16 AM

## 2024-08-14 NOTE — Op Note (Signed)
 PREOPERATIVE DIAGNOSIS:  Nuclear sclerotic cataract of the left eye.   POSTOPERATIVE DIAGNOSIS:  Nuclear sclerotic cataract of the left eye.   OPERATIVE PROCEDURE:ORPROCALL@   SURGEON:  Stephanie Carmine, MD.   ANESTHESIA:  Anesthesiologist: Ola Donny BROCKS, MD CRNA: Dave Maus, CRNA  1.      Managed anesthesia care. 2.     0.80ml of Shugarcaine was instilled following the paracentesis   COMPLICATIONS:  Viscoelastic was used to raise the pupil margin.  A  Malyugin ring was placed as the pupil would not achieve sufficient pharmacologic dilation to undergo cataract extraction safely.( The ring was removed atraumatically following insertion of the IOL.)    TECHNIQUE:   Stop and chop   DESCRIPTION OF PROCEDURE:  The patient was examined and consented in the preoperative holding area where the aforementioned topical anesthesia was applied to the left eye and then brought back to the Operating Room where the left eye was prepped and draped in the usual sterile ophthalmic fashion and a lid speculum was placed. A paracentesis was created with the side port blade and the anterior chamber was filled with viscoelastic. A near clear corneal incision was performed with the steel keratome. A continuous curvilinear capsulorrhexis was performed with a cystotome followed by the capsulorrhexis forceps. Hydrodissection and hydrodelineation were carried out with BSS on a blunt cannula. The lens was removed in a stop and chop  technique and the remaining cortical material was removed with the irrigation-aspiration handpiece. The capsular bag was inflated with viscoelastic and the intraocular lens was placed in the capsular bag without complication. The remaining viscoelastic was removed from the eye with the irrigation-aspiration handpiece. The wounds were hydrated. The anterior chamber was flushed with BSS and the eye was inflated to physiologic pressure. 0.79ml Vigamox was placed in the anterior chamber. The  wounds were found to be water tight. The eye was dressed with Combigan. The patient was given protective glasses to wear throughout the day and a shield with which to sleep tonight. The patient was also given drops with which to begin a drop regimen today and will follow-up with me in one day. Implant Name Type Inv. Item Serial No. Manufacturer Lot No. LRB No. Used Action  LENS IOL TECNIS EYHANCE 22.5 - [id number] Intraocular Lens LENS IOL TECNIS EYHANCE 22.5 [id number] [employer]  Left 1 Implanted    Procedure(s): PHACOEMULSIFICATION, CATARACT, WITH IOL INSERTION 34.50 02:34.7 (Left)  Electronically signed: Elsie Charles 08/14/2024 9:01 AM

## 2024-08-14 NOTE — Telephone Encounter (Signed)
 Called to confirm/remind patient of [facility] on [facility].   Appointment:   [x] Confirmed  [] Left mess   [] No answer/No voice mail  [] VM Full/unable to leave message  [] Phone not in service  Patient reminded to bring all medications and/or complete list.  Confirmed patient has transportation. Gave directions, instructed to utilize valet parking.

## 2024-08-14 NOTE — Transfer of Care (Signed)
 Immediate Anesthesia Transfer of Care Note  Patient: Stephanie Charles  Procedure(s) Performed: PHACOEMULSIFICATION, CATARACT, WITH IOL INSERTION 34.50 02:34.7 (Left: Eye)  Patient Location: PACU  Anesthesia Type: MAC  Level of Consciousness: awake, alert  and patient cooperative  Airway and Oxygen  Therapy: Patient Spontanous Breathing and Patient connected to supplemental oxygen   Post-op Assessment: Post-op Vital signs reviewed, Patient's Cardiovascular Status Stable, Respiratory Function Stable, Patent Airway and No signs of Nausea or vomiting  Post-op Vital Signs: Reviewed and stable  Complications: No notable events documented.

## 2024-08-14 NOTE — Progress Notes (Deleted)
 Advanced Heart Failure Clinic Note   Referring Physician: 07/25 admission PCP: Bertrum Charlie CROME, MD Cardiologist: Evalene Lunger, MD   Chief Complaint: shortness of breath   HPI:  Ms Velador is a 89 y/o female with a history of LBBB, TIA/CVA in 2016, CKD stage II, HTN, HLD, anemia, carotid artery disease, NSTEMI 06/2019 (PCI/DES to the LAD), NSTEMI 2016, hypothyroidism, PAD, anxiety, depression and chronic heart failure.    Admitted to [facility] in 06/2019 with NSTEMI and HTN. Echo 06/06/2019 showed LVEF 35-40%. Frequenct PVCs noted. R/L heart cath 06/07/2019 showed mid LAD 95% stenosis s/p successful PCI/DES, D2 30% stenosis. RHC showed mean RA pressure of 8, RV pressure 32/7 with a mean of 9, PA pressure 31/16 with a mean of 21, PCWP 10, CO/CI 3.57/2.02.   Admitted 07/2023 with chest pain that woke her up from sleep with elevated blood pressure.  She was treated with IV heparin  x 48 hours.  Echo showed EF of 55 to 60%.  She was restarted on Entresto .  She was felt to be a poor cardiac cath candidate. VQ scan showed no PE.   Went to the ER 01/06/24 for HTN and nausea. At home BP was 213/102. BP in the ER 172/80. HS trop 69>67.Labs were stable. CXR and CT head were reassuring. She was discharged home.   Admitted 04/08/24 with chest pain and shortness of breath. Chest x-ray showed acute pulmonary edema. BNP 359, troponin 468. She is diagnosed with acute congestive heart failure, started on IV Lasix  daily, but this had to be held due to worsening renal function. Echo 04/09/24: EF 45-50% with moderate LVH, G1DD, normal RV. Weaned off oxygen . Entresto  / diuretics held at discharge due to renal function. Losartan  was added.   Admitted 05/07/24 with shortness of breath and bilateral pedal edema due to HF exacerbation initially needing bipap. On antibiotics due to UTI. IV diuresed and then developed AKI. Given albumin , lasix  held and renal function improved. Elevated troponin thought to be due to demand  ischemia.  She presents today, with her daughter, for a HF follow-up visit with a chief complaint of moderate shortness of breath. Has associated fatigue, pedal edema. Last took torsemide  2 days ago. When she took 40mg  valsartan  at one time, she developed a severe HA along with diarrhea. She subsequently split the tablet up and now takes 20mg  BID without any side effects.   Getting cataracts repaired soon and she's looking to being able to see. Had a UTI earlier this year but prior to that it had been years since she had one.    Previous cardiac studies: Echo 09/18/2019: EF 40 to 45% Echo 03/07/21: EF 50-55%, normal RV, moderate LVH Echo 10/07/21: EF 40% Echo 07/29/23: EF 55 to 60%   ROS: All systems negative except what is listed in HPI, PMH and Problem List  Past Medical History:  Diagnosis Date   Anemia    Anxiety    C. difficile diarrhea    CAD (coronary artery disease)    Carotid artery disease    CHF (congestive heart failure) (HCC) 10/05/2021   Chronic kidney disease, stage 3a (HCC)    Depression    Diverticulitis    First degree AV block    GERD (gastroesophageal reflux disease)    Grade I diastolic dysfunction 04/09/2024   H/O heart artery stent    Coronary disease, stent placed to proximal to mid LAD   Hyperlipidemia    Hypertension    Hypothyroidism    LBBB (left  bundle branch block)    Myocardial infarction (HCC) 08/2017   Syncope    TIA (transient ischemic attack)    1 approx 2015, 1 approx 2018   Vertigo    last episode several months ago   [name]     Current Outpatient Medications  Medication Sig Dispense Refill   albuterol  (PROVENTIL ) (2.5 MG/3ML) 0.083% nebulizer solution USE 1 VIAL IN NEBULIZER EVERY 4 HOURS AS NEEDED 180 mL 6   allopurinol  (ZYLOPRIM ) 100 MG tablet Take 1 tablet (100 mg total) by mouth daily. 30 tablet 0   amitriptyline  (ELAVIL ) 10 MG tablet Take 1 tablet (10 mg total) by mouth at bedtime. (Patient taking  differently: Take 20 mg by mouth at bedtime.) 90 tablet 3   ASPIRIN  LOW DOSE 81 MG EC tablet NEW PRESCRIPTION REQUEST: TAKE ONE TABLET BY MOUTH EVERY DAY 90 tablet 3   atorvastatin  (LIPITOR) 40 MG tablet Take 1 tablet by mouth once daily 90 tablet 3   carvedilol  (COREG ) 3.125 MG tablet Take 1 tablet (3.125 mg total) by mouth 2 (two) times daily with a meal. 180 tablet 3   clopidogrel  (PLAVIX ) 75 MG tablet Take 1 tablet by mouth once daily 90 tablet 3   cyclobenzaprine  (FLEXERIL ) 5 MG tablet Take 5 mg by mouth 3 (three) times daily as needed.     ferrous sulfate  325 (65 FE) MG tablet Take 325 mg by mouth at bedtime.     fluorometholone  (FML) 0.1 % ophthalmic suspension Place 1 drop into both eyes 2 (two) times daily.     gabapentin  (NEURONTIN ) 100 MG capsule Take 1 capsule by mouth at bedtime.     isosorbide  mononitrate (IMDUR ) 30 MG 24 hr tablet Take 1 tablet (30 mg total) by mouth daily. 30 tablet 0   levothyroxine  (SYNTHROID ) 137 MCG tablet Take 1 tablet (137 mcg total) by mouth daily before breakfast. 90 tablet 3   Melatonin 10 MG TABS Take 10 tablets by mouth at bedtime.     mirtazapine  (REMERON ) 30 MG tablet TAKE 1 TABLET BY MOUTH AT BEDTIME 90 tablet 3   nitroGLYCERIN  (NITROSTAT ) 0.4 MG SL tablet Place 1 tablet (0.4 mg total) under the tongue every 5 (five) minutes as needed for chest pain. 25 tablet 3   pantoprazole  (PROTONIX ) 40 MG tablet Take 1 tablet (40 mg total) by mouth daily. 90 tablet 3   potassium chloride  (MICRO-K ) 10 MEQ CR capsule Take 10 mEq by mouth as needed.     sertraline  (ZOLOFT ) 25 MG tablet Take 1 tablet by mouth once daily 90 tablet 3   torsemide  (DEMADEX ) 10 MG tablet Take 1 tablet (10 mg total) by mouth daily. May take extra torsemide  10 mg as needed for weight 148 pounds. For weight 142 or less, please take torsemide  every other day. 90 tablet 3   valsartan  (DIOVAN ) 40 MG tablet Take 0.5 tablets (20 mg total) by mouth 2 (two) times daily.     No current  facility-administered medications for this visit.    Allergies  Allergen Reactions   Codeine Nausea And Vomiting and Nausea Only   Levofloxacin Other (See Comments)    Mouth sores   Lisinopril Other (See Comments) and Cough    Cough?   Other Itching   Povidone-Iodine Other (See Comments), Rash and Dermatitis    Severe blistering and itchiness. Severe redness   Simvastatin Other (See Comments)    Myalgias   Latex Itching      Social History  Socioeconomic History   Marital status: Divorced    Spouse name: na   Number of children: 4   Years of education: 14   Highest education level: Associate degree: occupational, scientist, product/process development, or vocational program  Occupational History   Occupation: retired  Tobacco Use   Smoking status: Never   Smokeless tobacco: Never  Vaping Use   Vaping status: Never Used  Substance and Sexual Activity   Alcohol use: No    Alcohol/week: 0.0 standard drinks of alcohol   Drug use: No   Sexual activity: Never  Other Topics Concern   Not on file  Social History Narrative   Not on file   Social Drivers of Health   Financial Resource Strain: Low Risk  (07/31/2024)   Received from [facility]   Overall Financial Resource Strain (CARDIA)    Difficulty of Paying Living Expenses: Not hard at all  Food Insecurity: No Food Insecurity (07/31/2024)   Received from [facility]   Hunger Vital Sign    Within the past 12 months, you worried that your food would run out before you got the money to buy more.: Never true    Within the past 12 months, the food you bought just didn't last and you didn't have money to get more.: Never true  Transportation Needs: No Transportation Needs (07/31/2024)   Received from [facility] - Transportation    In the past 12 months, has lack of transportation kept you from medical appointments or from getting medications?: No    Lack of Transportation  (Non-Medical): No  Physical Activity: Inactive (07/31/2024)   Received from [facility]   Exercise Vital Sign    On average, how many days per week do you engage in moderate to strenuous exercise (like a brisk walk)?: 0 days    On average, how many minutes do you engage in exercise at this level?: 0 min  Stress: No Stress Concern Present (08/18/2020)   [employer] - Occupational Stress Questionnaire    Feeling of Stress : Not at all  Social Connections: Socially Isolated (05/08/2024)   Social Connection and Isolation Panel    Frequency of Communication with Friends and Family: Never    Frequency of Social Gatherings with Friends and Family: More than three times a week    Attends Religious Services: Never    Database Administrator or Organizations: No    Attends Banker Meetings: Never    Marital Status: Divorced  Catering Manager Violence: Not At Risk (05/08/2024)   Humiliation, Afraid, Rape, and Kick questionnaire    Fear of Current or Ex-Partner: No    Emotionally Abused: No    Physically Abused: No    Sexually Abused: No      Family History  Problem Relation Age of Onset   Heart disease Mother    Hypertension Mother    Stroke Mother    Heart disease Father    Breast cancer Sister    Alzheimer's disease Brother    Heart attack Brother    Stroke Brother    Heart disease Brother    Mental illness Brother        died suicide   Cancer Brother        prostate   Heart disease Brother        atrial fib   Alzheimer's disease Brother    Clotting disorder Daughter    Fibromyalgia  Daughter    There were no vitals filed for this visit.  Wt Readings from Last 3 Encounters:  08/14/24 146 lb (66.2 kg)  07/30/24 146 lb 4 oz (66.3 kg)  07/21/24 153 lb (69.4 kg)   Lab Results  Component Value Date   CREATININE 2.10 (H) 07/21/2024   CREATININE 2.11 (H) 07/20/2024   CREATININE 1.99 (H) 07/20/2024    PHYSICAL  EXAM:  General: Well appearing.  Cor: No JVD. Regular rhythm, rate.  Lungs: clear Abdomen: soft, nontender, nondistended. Extremities: 1+ pitting edema bilateral lower legs to mid shins Neuro:. Affect pleasant    ECG: not done   ASSESSMENT & PLAN:  1: ICM with mildly reduced ejection fraction- - NYHA class III - euvolemic - Echo 09/18/2019: EF 40 to 45% - Echo 03/07/21: EF 50-55%, normal RV, moderate LVH - Echo 10/07/21: EF 40% - Echo 07/29/23: EF 55 to 60% - Echo 04/09/24: EF 45-50% with moderate LVH, G1DD, normal RV.  - weight up 3 pounds from last visit here 2 weeks ago - continue carvedilol  3.125mg  BID - continue torsemide  20mg / potassium 10meq PRN.  - continue valsartan  20mg  BID. When she took 40mg  at one time, she developed HA / diarrhea - begin jardiance 10mg  daily. 30 day voucher provided. Had UTI earlier this year but prior to that it had been years.  - BMET today and repeat at next visit - Explained the importance of keeping her BP under control yet managing her fluid retention & kidney function - Wearing compression socks daily - BNP 05/07/24 was 585.4  2: HTN- - BP 129/92 - continue amlodipine , hydralazine , isosorbide  MN, valsartan  - saw PCP Garnet) 08/25 - BMET 05/29/24 reviewed: sodium 139, potassium 4.3, creatinine 1.5 & GFR 33 - BMET today   3: CAD- - saw cardiology Florestine) 08/25; returns 10/25 - continue plavix  75mg  daily - NSTEMI 2016 - NSTEMI 06/2019 (PCI/DES to the LAD), - R/L heart cath 06/07/2019 showed mid LAD 95% stenosis s/p successful PCI/DES, D2 30% stenosis. RHC showed mean RA pressure of 8, RV pressure 32/7 with a mean of 9, PA pressure 31/16 with a mean of 21, PCWP 10, CO/CI 3.57/2.02.   4: Anemia- - Hg 05/29/24 was 10.3 - continue oral iron  5: Hyperlipidemia- - continue atorvastatin  40mg  daily - LDL 04/09/24 was 31   Return in 1 month, sooner if needed.   I spent 38 minutes reviewing records, interviewing/ examing patient and managing  plan/ orders.   Ellouise DELENA Class, FNP 08/14/24

## 2024-08-15 ENCOUNTER — Observation Stay: Admission: EM | Admit: 2024-08-15 | Discharge: 2024-08-16 | Disposition: A | Discharge status: Home / Self Care

## 2024-08-15 ENCOUNTER — Emergency Department

## 2024-08-15 ENCOUNTER — Encounter: Admitting: Family

## 2024-08-15 ENCOUNTER — Other Ambulatory Visit: Payer: Self-pay

## 2024-08-15 DIAGNOSIS — E039 Hypothyroidism, unspecified: Secondary | ICD-10-CM | POA: Diagnosis not present

## 2024-08-15 DIAGNOSIS — N1832 Chronic kidney disease, stage 3b: Secondary | ICD-10-CM | POA: Insufficient documentation

## 2024-08-15 DIAGNOSIS — R079 Chest pain, unspecified: Secondary | ICD-10-CM

## 2024-08-15 DIAGNOSIS — Z79899 Other long term (current) drug therapy: Secondary | ICD-10-CM | POA: Insufficient documentation

## 2024-08-15 DIAGNOSIS — I5043 Acute on chronic combined systolic (congestive) and diastolic (congestive) heart failure: Principal | ICD-10-CM | POA: Insufficient documentation

## 2024-08-15 DIAGNOSIS — R54 Age-related physical debility: Secondary | ICD-10-CM | POA: Insufficient documentation

## 2024-08-15 DIAGNOSIS — I130 Hypertensive heart and chronic kidney disease with heart failure and stage 1 through stage 4 chronic kidney disease, or unspecified chronic kidney disease: Secondary | ICD-10-CM | POA: Diagnosis not present

## 2024-08-15 DIAGNOSIS — Z7982 Long term (current) use of aspirin: Secondary | ICD-10-CM | POA: Diagnosis not present

## 2024-08-15 DIAGNOSIS — R0602 Shortness of breath: Secondary | ICD-10-CM | POA: Diagnosis present

## 2024-08-15 DIAGNOSIS — R2689 Other abnormalities of gait and mobility: Secondary | ICD-10-CM | POA: Diagnosis not present

## 2024-08-15 DIAGNOSIS — I160 Hypertensive urgency: Secondary | ICD-10-CM | POA: Diagnosis not present

## 2024-08-15 DIAGNOSIS — Z9842 Cataract extraction status, left eye: Secondary | ICD-10-CM | POA: Diagnosis not present

## 2024-08-15 DIAGNOSIS — I2510 Atherosclerotic heart disease of native coronary artery without angina pectoris: Secondary | ICD-10-CM | POA: Diagnosis not present

## 2024-08-15 DIAGNOSIS — I509 Heart failure, unspecified: Secondary | ICD-10-CM

## 2024-08-15 DIAGNOSIS — J9601 Acute respiratory failure with hypoxia: Secondary | ICD-10-CM | POA: Diagnosis not present

## 2024-08-15 DIAGNOSIS — Z91040 Latex allergy status: Secondary | ICD-10-CM | POA: Diagnosis not present

## 2024-08-15 DIAGNOSIS — J810 Acute pulmonary edema: Secondary | ICD-10-CM

## 2024-08-15 DIAGNOSIS — J811 Chronic pulmonary edema: Secondary | ICD-10-CM

## 2024-08-15 HISTORY — DX: Chronic pulmonary edema: J81.1

## 2024-08-15 LAB — CBC WITH DIFFERENTIAL/PLATELET
Abs Immature Granulocytes: 0.05 K/uL (ref 0.00–0.07)
Basophils Absolute: 0.1 K/uL (ref 0.0–0.1)
Basophils Relative: 1 %
Eosinophils Absolute: 0.2 K/uL (ref 0.0–0.5)
Eosinophils Relative: 2 %
HCT: 35.8 % — ABNORMAL LOW (ref 36.0–46.0)
Hemoglobin: 11.6 g/dL — ABNORMAL LOW (ref 12.0–15.0)
Immature Granulocytes: 1 %
Lymphocytes Relative: 14 %
Lymphs Abs: 1.3 K/uL (ref 0.7–4.0)
MCH: 30.4 pg (ref 26.0–34.0)
MCHC: 32.4 g/dL (ref 30.0–36.0)
MCV: 94 fL (ref 80.0–100.0)
Monocytes Absolute: 0.4 K/uL (ref 0.1–1.0)
Monocytes Relative: 4 %
Neutro Abs: 7.5 K/uL (ref 1.7–7.7)
Neutrophils Relative %: 78 %
Platelets: 170 K/uL (ref 150–400)
RBC: 3.81 MIL/uL — ABNORMAL LOW (ref 3.87–5.11)
RDW: 14.2 % (ref 11.5–15.5)
WBC: 9.5 K/uL (ref 4.0–10.5)
nRBC: 0 % (ref 0.0–0.2)

## 2024-08-15 LAB — TROPONIN T, HIGH SENSITIVITY
Troponin T High Sensitivity: 69 ng/L — ABNORMAL HIGH (ref 0–19)
Troponin T High Sensitivity: 68 ng/L — ABNORMAL HIGH (ref 0–19)

## 2024-08-15 LAB — COMPREHENSIVE METABOLIC PANEL WITH GFR
ALT: 27 U/L (ref 0–44)
AST: 33 U/L (ref 15–41)
Albumin: 4.2 g/dL (ref 3.5–5.0)
Alkaline Phosphatase: 129 U/L — ABNORMAL HIGH (ref 38–126)
Anion gap: 12 (ref 5–15)
BUN: 34 mg/dL — ABNORMAL HIGH (ref 8–23)
CO2: 25 mmol/L (ref 22–32)
Calcium: 9.4 mg/dL (ref 8.9–10.3)
Chloride: 104 mmol/L (ref 98–111)
Creatinine, Ser: 1.39 mg/dL — ABNORMAL HIGH (ref 0.44–1.00)
GFR, Estimated: 36 mL/min — ABNORMAL LOW (ref >60)
Glucose, Bld: 115 mg/dL — ABNORMAL HIGH (ref 70–99)
Potassium: 4.5 mmol/L (ref 3.5–5.1)
Sodium: 140 mmol/L (ref 135–145)
Total Bilirubin: 0.6 mg/dL (ref 0.0–1.2)
Total Protein: 6.6 g/dL (ref 6.5–8.1)

## 2024-08-15 LAB — RESP PANEL BY RT-PCR (RSV, FLU A&B, COVID)  RVPGX2
Influenza A by PCR: NEGATIVE
Influenza B by PCR: NEGATIVE
Resp Syncytial Virus by PCR: NEGATIVE
SARS Coronavirus 2 by RT PCR: NEGATIVE

## 2024-08-15 LAB — PRO BRAIN NATRIURETIC PEPTIDE: Pro Brain Natriuretic Peptide: 13914 pg/mL — ABNORMAL HIGH (ref <300.0)

## 2024-08-15 MED ORDER — MIRTAZAPINE 15 MG PO TABS
30.0000 mg | ORAL_TABLET | Freq: Every day | ORAL | Status: DC
  Administered 2024-08-15: 30 mg via ORAL
  Filled 2024-08-15: qty 2

## 2024-08-15 MED ORDER — HEPARIN SODIUM (PORCINE) 5000 UNIT/ML IJ SOLN
5000.0000 [IU] | Freq: Three times a day (TID) | INTRAMUSCULAR | Status: DC
  Administered 2024-08-15 – 2024-08-16 (×2): 5000 [IU] via SUBCUTANEOUS
  Filled 2024-08-15 (×2): qty 1

## 2024-08-15 MED ORDER — IPRATROPIUM-ALBUTEROL 0.5-2.5 (3) MG/3ML IN SOLN
3.0000 mL | Freq: Once | RESPIRATORY_TRACT | Status: AC
  Administered 2024-08-15: 3 mL via RESPIRATORY_TRACT
  Filled 2024-08-15: qty 3

## 2024-08-15 MED ORDER — ISOSORBIDE MONONITRATE ER 60 MG PO TB24
30.0000 mg | ORAL_TABLET | Freq: Every day | ORAL | Status: DC
  Filled 2024-08-15: qty 1

## 2024-08-15 MED ORDER — AMITRIPTYLINE HCL 10 MG PO TABS
10.0000 mg | ORAL_TABLET | Freq: Every day | ORAL | Status: DC
  Administered 2024-08-15: 10 mg via ORAL
  Filled 2024-08-15 (×2): qty 1

## 2024-08-15 MED ORDER — ASPIRIN 81 MG PO TBEC
81.0000 mg | DELAYED_RELEASE_TABLET | Freq: Every day | ORAL | Status: DC
  Administered 2024-08-15 – 2024-08-16 (×2): 81 mg via ORAL
  Filled 2024-08-15 (×2): qty 1

## 2024-08-15 MED ORDER — IRBESARTAN 75 MG PO TABS
37.5000 mg | ORAL_TABLET | Freq: Every day | ORAL | Status: DC
  Filled 2024-08-15: qty 0.5

## 2024-08-15 MED ORDER — FUROSEMIDE 10 MG/ML IJ SOLN
40.0000 mg | Freq: Once | INTRAMUSCULAR | Status: AC
  Administered 2024-08-15: 40 mg via INTRAVENOUS
  Filled 2024-08-15: qty 4

## 2024-08-15 MED ORDER — GABAPENTIN 100 MG PO CAPS
100.0000 mg | ORAL_CAPSULE | Freq: Every day | ORAL | Status: DC
  Administered 2024-08-15: 100 mg via ORAL
  Filled 2024-08-15 (×2): qty 1

## 2024-08-15 MED ORDER — CLOPIDOGREL BISULFATE 75 MG PO TABS
75.0000 mg | ORAL_TABLET | Freq: Every day | ORAL | Status: DC
  Administered 2024-08-15 – 2024-08-16 (×2): 75 mg via ORAL
  Filled 2024-08-15 (×2): qty 1

## 2024-08-15 MED ORDER — FUROSEMIDE 10 MG/ML IJ SOLN
40.0000 mg | Freq: Two times a day (BID) | INTRAMUSCULAR | Status: DC
  Administered 2024-08-15 – 2024-08-16 (×2): 40 mg via INTRAVENOUS
  Filled 2024-08-15 (×2): qty 4

## 2024-08-15 MED ORDER — FERROUS SULFATE 325 (65 FE) MG PO TABS
325.0000 mg | ORAL_TABLET | Freq: Every day | ORAL | Status: DC
  Administered 2024-08-15: 325 mg via ORAL
  Filled 2024-08-15: qty 1

## 2024-08-15 MED ORDER — CARVEDILOL 3.125 MG PO TABS
3.1250 mg | ORAL_TABLET | Freq: Two times a day (BID) | ORAL | Status: DC
  Administered 2024-08-15 – 2024-08-16 (×2): 3.125 mg via ORAL
  Filled 2024-08-15 (×2): qty 1

## 2024-08-15 MED ORDER — SERTRALINE HCL 50 MG PO TABS
25.0000 mg | ORAL_TABLET | Freq: Every day | ORAL | Status: DC
  Administered 2024-08-15 – 2024-08-16 (×2): 25 mg via ORAL
  Filled 2024-08-15 (×2): qty 1

## 2024-08-15 MED ORDER — MELATONIN 5 MG PO TABS
10.0000 mg | ORAL_TABLET | Freq: Every day | ORAL | Status: DC
  Administered 2024-08-15: 10 mg via ORAL
  Filled 2024-08-15: qty 2

## 2024-08-15 MED ORDER — PANTOPRAZOLE SODIUM 40 MG PO TBEC
40.0000 mg | DELAYED_RELEASE_TABLET | Freq: Every day | ORAL | Status: DC
  Administered 2024-08-15 – 2024-08-16 (×2): 40 mg via ORAL
  Filled 2024-08-15 (×2): qty 1

## 2024-08-15 MED ORDER — ALLOPURINOL 100 MG PO TABS
100.0000 mg | ORAL_TABLET | Freq: Every day | ORAL | Status: DC
  Administered 2024-08-15 – 2024-08-16 (×2): 100 mg via ORAL
  Filled 2024-08-15 (×2): qty 1

## 2024-08-15 MED ORDER — PREDNISOLON-MOXIFLOX-BROMFENAC 1-0.5-0.075 % OP SOLN
1.0000 [drp] | Freq: Four times a day (QID) | OPHTHALMIC | Status: DC
  Administered 2024-08-15 – 2024-08-16 (×4): 1 [drp] via OPHTHALMIC
  Filled 2024-08-15: qty 5

## 2024-08-15 MED ORDER — LEVOTHYROXINE SODIUM 25 MCG PO TABS
125.0000 ug | ORAL_TABLET | Freq: Every morning | ORAL | Status: DC
  Administered 2024-08-15 – 2024-08-16 (×2): 125 ug via ORAL
  Filled 2024-08-15 (×2): qty 1

## 2024-08-15 MED ORDER — ATORVASTATIN CALCIUM 20 MG PO TABS
40.0000 mg | ORAL_TABLET | Freq: Every day | ORAL | Status: DC
  Administered 2024-08-15 – 2024-08-16 (×2): 40 mg via ORAL
  Filled 2024-08-15 (×2): qty 2

## 2024-08-15 NOTE — H&P (Addendum)
 History and Physical    Patient: Stephanie Charles FMW:[id number] DOB: 03-28-1930 DOA: 08/15/2024 DOS: the patient was seen and examined on 08/15/2024 PCP: Bertrum Charlie CROME, MD  Patient coming from: Home  Chief Complaint:  Chief Complaint  Patient presents with   Shortness of Breath   Chest Pain   HPI:  Stephanie Charles is a 89 y.o. female with medical history significant of combined chronic HFrEF and HFpEF with LVEF 45-50%, grade 1 diastolic dysfunction, CKD stage IIIa, HTN, hypothyroidism, CAD s/p stenting presented with SOB and chest pain that began just prior to arrival.  Patient underwent Left side cataract surgery 11/11.  EMS noted patient's saturation noted to be 88% on room air, which improved to 96% on 3 L [place].  Also reports chest pain 9/10, that is midsternal, pressure-like and nonradiating, which resolved on my exam. Patient denies abdominal pain, nausea/vomiting, weakness/numbness, changes in vision  On presentation to ED found to be tachycardic with HR 106 bpm, BP 189/105, RR 25, saturating 98% on 2 L [place]. Workup revealed CMP unremarkable except Cr 1.39, pro BNP 13,914, Troponin 69 -> 68 CBC with no leukocytosis, Hb 11.6, CXR shows moderate cardiomegaly with findings of interstitial edema.  Possible trace right pleural effusion EKG shows sinus or atrial ectopic tachycardia, LBBB (present on previous EKG)  Was given Lasix  40 mg IV in ER  Review of Systems: As mentioned in the history of present illness. All other systems reviewed and are negative. Past Medical History:  Diagnosis Date   Anemia    Anxiety    C. difficile diarrhea    CAD (coronary artery disease)    Carotid artery disease    CHF (congestive heart failure) (HCC) 10/05/2021   Chronic kidney disease, stage 3a (HCC)    Depression    Diverticulitis    First degree AV block    GERD (gastroesophageal reflux disease)    Grade I diastolic dysfunction 04/09/2024   H/O heart artery stent    Coronary disease, stent  placed to proximal to mid LAD   Hyperlipidemia    Hypertension    Hypothyroidism    LBBB (left bundle branch block)    Myocardial infarction (HCC) 08/2017   Syncope    TIA (transient ischemic attack)    1 approx 2015, 1 approx 2018   Vertigo    last episode several months ago   [name]    Past Surgical History:  Procedure Laterality Date   CATARACT EXTRACTION W/PHACO Left 08/14/2024   Procedure: PHACOEMULSIFICATION, CATARACT, WITH IOL INSERTION 34.50 02:34.7;  Surgeon: Jaye Fallow, MD;  Location: [facility];  Service: Ophthalmology;  Laterality: Left;   COLONOSCOPY WITH PROPOFOL  N/A 06/22/2018   Procedure: COLONOSCOPY WITH PROPOFOL ;  Surgeon: Therisa Bi, MD;  Location: [facility];  Service: Endoscopy;  Laterality: N/A;   CORONARY STENT INTERVENTION N/A 06/07/2019   Procedure: CORONARY STENT INTERVENTION;  Surgeon: Darron Deatrice LABOR, MD;  Location: [facility];  Service: Cardiovascular;  Laterality: N/A;   ESOPHAGOGASTRODUODENOSCOPY (EGD) WITH PROPOFOL  N/A 06/22/2018   Procedure: ESOPHAGOGASTRODUODENOSCOPY (EGD) WITH PROPOFOL ;  Surgeon: Therisa Bi, MD;  Location: [facility] END[facility];  Service: Endoscopy;  Laterality: N/A;   KNEE ARTHROSCOPY Right    REPLACEMENT TOTAL KNEE BILATERAL     RIGHT/LEFT HEART CATH AND CORONARY ANGIOGRAPHY N/A 06/07/2019   Procedure: RIGHT/LEFT HEART CATH AND CORONARY ANGIOGRAPHY;  Surgeon: Perla Evalene PARAS, MD;  Location: [facility] INVASIVE CV LAB;  Service: Cardiovascular;  Laterality: N/A;  SHOULDER SURGERY Left    Social History:  reports that she has never smoked. She has never used smokeless tobacco. She reports that she does not drink alcohol and does not use drugs.  Allergies  Allergen Reactions   Codeine Nausea And Vomiting and Nausea Only   Levofloxacin Other (See Comments)    Mouth sores   Lisinopril Other (See Comments) and Cough    Cough?   Other Itching   Povidone-Iodine Other (See Comments), Rash and  Dermatitis    Severe blistering and itchiness. Severe redness   Simvastatin Other (See Comments)    Myalgias   Latex Itching    Family History  Problem Relation Age of Onset   Heart disease Mother    Hypertension Mother    Stroke Mother    Heart disease Father    Breast cancer Sister    Alzheimer's disease Brother    Heart attack Brother    Stroke Brother    Heart disease Brother    Mental illness Brother        died suicide   Cancer Brother        prostate   Heart disease Brother        atrial fib   Alzheimer's disease Brother    Clotting disorder Daughter    Fibromyalgia Daughter     Prior to Admission medications   Medication Sig Start Date End Date Taking? Authorizing Provider  albuterol  (PROVENTIL ) (2.5 MG/3ML) 0.083% nebulizer solution USE 1 VIAL IN NEBULIZER EVERY 4 HOURS AS NEEDED 01/04/22   Bertrum Charlie CROME, MD  allopurinol  (ZYLOPRIM ) 100 MG tablet Take 1 tablet (100 mg total) by mouth daily. 07/21/24   Alexander, Natalie, DO  amitriptyline  (ELAVIL ) 10 MG tablet Take 1 tablet (10 mg total) by mouth at bedtime. Patient taking differently: Take 20 mg by mouth at bedtime. 12/27/22   Gasper Nancyann BRAVO, MD  ASPIRIN  LOW DOSE 81 MG EC tablet NEW PRESCRIPTION REQUEST: TAKE ONE TABLET BY MOUTH EVERY DAY 08/03/21   Bertrum Charlie CROME, MD  atorvastatin  (LIPITOR) 40 MG tablet Take 1 tablet by mouth once daily 03/23/24   Gollan, Timothy J, MD  carvedilol  (COREG ) 3.125 MG tablet Take 1 tablet (3.125 mg total) by mouth 2 (two) times daily with a meal. 11/08/23   Perla Evalene PARAS, MD  clopidogrel  (PLAVIX ) 75 MG tablet Take 1 tablet by mouth once daily 04/05/24   Gollan, Timothy J, MD  cyclobenzaprine  (FLEXERIL ) 5 MG tablet Take 5 mg by mouth 3 (three) times daily as needed. 09/13/23   [provider]  ferrous sulfate  325 (65 FE) MG tablet Take 325 mg by mouth at bedtime.    [provider]  fluorometholone  (FML) 0.1 % ophthalmic suspension Place 1 drop into both eyes 2  (two) times daily. 01/17/24   [provider]  gabapentin  (NEURONTIN ) 100 MG capsule Take 1 capsule by mouth at bedtime. 11/10/23   [provider]  isosorbide  mononitrate (IMDUR ) 30 MG 24 hr tablet Take 1 tablet (30 mg total) by mouth daily. 07/21/24   Alexander, Natalie, DO  levothyroxine  (SYNTHROID ) 137 MCG tablet Take 1 tablet (137 mcg total) by mouth daily before breakfast. 04/12/22   Bertrum Charlie CROME, MD  Melatonin 10 MG TABS Take 10 tablets by mouth at bedtime.    [provider]  mirtazapine  (REMERON ) 30 MG tablet TAKE 1 TABLET BY MOUTH AT BEDTIME 04/21/22   Bertrum Charlie CROME, MD  nitroGLYCERIN  (NITROSTAT ) 0.4 MG SL tablet Place 1 tablet (  0.4 mg total) under the tongue every 5 (five) minutes as needed for chest pain. 04/09/22   Gollan, Timothy J, MD  pantoprazole  (PROTONIX ) 40 MG tablet Take 1 tablet (40 mg total) by mouth daily. 11/08/23   Gollan, Timothy J, MD  potassium chloride  (MICRO-K ) 10 MEQ CR capsule Take 10 mEq by mouth as needed.    [provider]  sertraline  (ZOLOFT ) 25 MG tablet Take 1 tablet by mouth once daily 04/21/22   Gilbert, Richard L, MD  torsemide  (DEMADEX ) 10 MG tablet Take 1 tablet (10 mg total) by mouth daily. May take extra torsemide  10 mg as needed for weight 148 pounds. For weight 142 or less, please take torsemide  every other day. 07/30/24   Gollan, Timothy J, MD  valsartan  (DIOVAN ) 40 MG tablet Take 0.5 tablets (20 mg total) by mouth 2 (two) times daily. 07/21/24   Marsa Edelman, DO    Physical Exam: Vitals:   08/15/24 0930 08/15/24 1000 08/15/24 1428 08/15/24 1526  BP: (!) 158/86 (!) 156/69 123/66 (!) 156/67  Pulse: 83 70 81 69  Resp: 20 20 18 19   Temp:    97.7 F (36.5 C)  TempSrc:    Oral  SpO2: 98% 97% 97% 98%  Weight:      Height:       Constitutional:      General: He is not in acute distress. HENT:     Head: Normocephalic and atraumatic.  Cardiovascular:     Rate and Rhythm: RRR, regular    Heart sounds:  Normal heart sounds.  Pulmonary:     Effort: Pulmonary effort is normal.     Breath sounds: bibasilar crackles present Abdominal:     Palpations: Abdomen is soft.     Tenderness: There is no abdominal tenderness.  Neurological:     Mental Status: Mental status is at baseline  Ext: 1+ pedal edema bilaterally   Data Reviewed: Labs and Imaging reviewed  Assessment and Plan: Acute on chronic combined HFrEF and HFpEF Acute hypoxic respiratory failure - Requiring 2-3L [place] on admission - Echo 04/09/24: LVEF 45-50, LV global hypokinesis, grade 1 diast df - Elevated troponin, fat - likely secondary to demand ischemia - Taking Torsemide  10mg  on alternate days based on weight - On IV lasix  40mg  bid - Resume Carvedilol . Hold Imdur , ARB - resume as BP tolerates - Jardiance was discontinued due to UTI's - Daily weight, Intake/Output, wean oxygen  as able  CAD w/ hx PCI Hypertensive urgency, HTN - On Lasix , Coreg , asa, plavix  - Resume ARB, Imdur  as BP tolerates   CKD stage IIIb / ?IV?  - Reduced allopurinol  - Monitor Cr   Hypothyroidism - Continue Synthroid   S/p Left eye Cataract surgery - Resume home eye drops - sent secure message to pharmacy for med rec   Deconditioning - PT/OT eval    Advance Care Planning:   Code Status: Limited: Do not attempt resuscitation (DNR) -DNR-LIMITED -Do Not Intubate/DNI  - discussed with patient  Consults: None  Family Communication: Called daughter - left voicemail  Severity of Illness: The appropriate patient status for this patient is INPATIENT. Inpatient status is judged to be reasonable and necessary in order to provide the required intensity of service to ensure the patient's safety. The patient's presenting symptoms, physical exam findings, and initial radiographic and laboratory data in the context of their chronic comorbidities is felt to place them at high risk for further clinical deterioration. Furthermore, it is not anticipated that the  patient will  be medically stable for discharge from the hospital within 2 midnights of admission.   * I certify that at the point of admission it is my clinical judgment that the patient will require inpatient hospital care spanning beyond 2 midnights from the point of admission due to high intensity of service, high risk for further deterioration and high frequency of surveillance required.*  Author: Laree Lock, MD 08/15/2024 3:50 PM  For on call review [email or web address].

## 2024-08-15 NOTE — ED Provider Notes (Signed)
 [facility] Provider Note   Event Date/Time   First MD Initiated Contact with Patient 08/15/24 [phone]     (approximate) History  Shortness of Breath and Chest Pain  HPI Stephanie Charles is a 89 y.o. female with a past medical history of hypertension, hypothyroidism, hyperlipidemia, CAD, CHF, CKD who presents via EMS complaining of shortness of breath and chest pain that began just prior to arrival.  Patient states that she had cataract surgery yesterday was in her normal state of health after that.  EMS noted patient's to be 88% on room air and improved to 96% on 3 L with rales noted on the right side.  Patient now reports chest pain of 9/10 that is central, nonradiating, and feels like a pressure. ROS: Patient currently denies any vision changes, tinnitus, difficulty speaking, facial droop, sore throat, abdominal pain, nausea/vomiting/diarrhea, dysuria, or weakness/numbness/paresthesias in any extremity   Physical Exam  Triage Vital Signs: ED Triage Vitals  Encounter Vitals Group     BP      Girls Systolic BP Percentile      Girls Diastolic BP Percentile      Boys Systolic BP Percentile      Boys Diastolic BP Percentile      Pulse      Resp      Temp      Temp src      SpO2      Weight      Height      Head Circumference      Peak Flow      Pain Score      Pain Loc      Pain Education      Exclude from Growth Chart    Most recent vital signs: Vitals:   08/15/24 0859 08/15/24 0900  BP:  (!) 189/105  Pulse:  (!) 106  Resp:  (!) 25  Temp:  98.1 F (36.7 C)  SpO2: 98% 98%   General: Awake, oriented x4. CV:  Good peripheral perfusion. Resp:  Increased effort.  Mild end expiratory wheezing over the right upper lung fields that clears with coughing.  Patient also has rales over right upper and lower lung fields Abd:  No distention. Other:  Elderly overweight Caucasian female resting on stretcher in mild respiratory distress ED Results / Procedures /  Treatments  Labs (all labs ordered are listed, but only abnormal results are displayed) Labs Reviewed  COMPREHENSIVE METABOLIC PANEL WITH GFR - Abnormal; Notable for the following components:      Result Value   Glucose, Bld 115 (*)    BUN 34 (*)    Creatinine, Ser 1.39 (*)    Alkaline Phosphatase 129 (*)    GFR, Estimated 36 (*)    All other components within normal limits  PRO BRAIN NATRIURETIC PEPTIDE - Abnormal; Notable for the following components:   Pro Brain Natriuretic Peptide 13,914.0 (*)    All other components within normal limits  CBC WITH DIFFERENTIAL/PLATELET - Abnormal; Notable for the following components:   RBC 3.81 (*)    Hemoglobin 11.6 (*)    HCT 35.8 (*)    All other components within normal limits  TROPONIN T, HIGH SENSITIVITY - Abnormal; Notable for the following components:   Troponin T High Sensitivity 69 (*)    All other components within normal limits  RESP PANEL BY RT-PCR (RSV, FLU A&B, COVID)  [id number]   EKG ED ECG REPORT I, Artist MARLA Kerns, the attending  physician, personally viewed and interpreted this ECG. Date: 08/15/2024 EKG Time: 0855 Rate: 113 Rhythm: Tachycardic sinus rhythm QRS Axis: normal Intervals: Left bundle branch block ST/T Wave abnormalities: normal Narrative Interpretation: Tachycardic sinus rhythm with left bundle branch block no evidence of acute ischemia RADIOLOGY ED MD interpretation: Single view portable chest x-ray shows moderate cardiomegaly with redemonstration finding of interstitial edema and possible trace right pleural effusion - All radiology independently interpreted and agree with radiology assessment Official radiology report(s): DG Chest Port 1 View Result Date: 08/15/2024 EXAM: 1 VIEW(S) XRAY OF THE CHEST 08/15/2024 09:21:00 AM COMPARISON: 07/19/2024 CLINICAL HISTORY: SHOB FINDINGS: LUNGS AND PLEURA: Low lung volumes. Bilateral perihilar and subcentral opacities throughout both lungs, consistent with interstitial  edema. Eventration of the right hemidiaphragm with blunting of the right costophrenic sulcus. No pneumothorax. HEART AND MEDIASTINUM: Moderate cardiomegaly. Aortic atherosclerosis. BONES AND SOFT TISSUES: No acute osseous abnormality. IMPRESSION: 1. Moderate cardiomegaly with redemonstrated findings of interstitial edema. 2. Possible trace right pleural effusion. Electronically signed by: Rogelia Myers MD 08/15/2024 09:46 AM EST RP Workstation: [id number]   PROCEDURES: Critical Care performed: Yes, see critical care procedure note(s) .1-3 Lead EKG Interpretation  Performed by: Jossie Artist POUR, MD Authorized by: Jossie Artist POUR, MD     Interpretation: abnormal     ECG rate:  111   ECG rate assessment: tachycardic     Rhythm: sinus tachycardia     Ectopy: none     Conduction: normal   Comments:     Left bundle branch block CRITICAL CARE Performed by: Alazar Cherian K Kathelyn Gombos  Total critical care time: 37 minutes  Critical care time was exclusive of separately billable procedures and treating other patients.  Critical care was necessary to treat or prevent imminent or life-threatening deterioration.  Critical care was time spent personally by me on the following activities: development of treatment plan with patient and/or surrogate as well as nursing, discussions with consultants, evaluation of patient's response to treatment, examination of patient, obtaining history from patient or surrogate, ordering and performing treatments and interventions, ordering and review of laboratory studies, ordering and review of radiographic studies, pulse oximetry and re-evaluation of patient's condition.  MEDICATIONS ORDERED IN ED: Medications  furosemide  (LASIX ) injection 40 mg (has no administration in time range)  ipratropium-albuterol  (DUONEB) 0.5-2.5 (3) MG/3ML nebulizer solution 3 mL (has no administration in time range)   IMPRESSION / MDM / ASSESSMENT AND PLAN / ED COURSE  I reviewed the triage vital  signs and the nursing notes.                             The patient is on the cardiac monitor to evaluate for evidence of arrhythmia and/or significant heart rate changes. Patient's presentation is most consistent with acute presentation with potential threat to life or bodily function. Patient is a 89 year old female with the above-stated past medical history that presents via EMS complaining of shortness of breath and chest pain after cataract surgery yesterday.  Patient was found to be hypoxic and placed on 3 L nasal cannula with resolution DDx: CHF exacerbation, ACS, pneumonia, aspiration, PE Plan: CBC, CMP, troponin, BNP, chest x-ray, RVP, EKG  Laboratory radiologic evaluation significant for elevated troponin to 69, elevation in proBNP to 14k, and interstitial edema on chest x-ray.  Patient is continuing to require supplemental oxygenation at this time and will require admission to the internal medicine service due to volume overload.  Patient given 40 mg  of Lasix  empirically.  I spoken to the on-call hospitalist who agrees with plan for admission for further evaluation and management.  Dispo: Admit to medicine   FINAL CLINICAL IMPRESSION(S) / ED DIAGNOSES   Final diagnoses:  Shortness of breath  Acute pulmonary edema (HCC)  Acute respiratory failure with hypoxia (HCC)   Rx / DC Orders   ED Discharge Orders     None      Note:  This document was prepared using Dragon voice recognition software and may include unintentional dictation errors.   Areona Homer K, MD 08/15/24 [id number]

## 2024-08-15 NOTE — ED Notes (Signed)
 CCMD called to put pt on monitoring.

## 2024-08-15 NOTE — ED Triage Notes (Addendum)
 Pt from home via [facility] with c/o SOB and CP that started this morning. Per EMS, pt has a hx of CHF, CAD, hyperthyroidism, HTN; pt had cataract surgery yesterday, pt's O2 was 88% on RA, and 96% on 3L and rales noted on right side. Pt reports CP 9/10 at this time. Pt WOB is increased. Pt is A&Ox4.

## 2024-08-15 NOTE — Progress Notes (Signed)
 MEDICATION RELATED CONSULT NOTE - INITIAL   Pharmacy Consult for eye drops  Indication: Cataract surgery  Verified with patient eye drops from cataract surgery. Prednisolon-Moxiflox-Bromfenac 1-0.5-0.075 % SOLN  1 drop in left eye QID Patient would like to use her own supply. Ordered added to chart and medication will be kept at bedside.

## 2024-08-15 NOTE — ED Notes (Signed)
 Pharmacy unable to provide pt eye drops. Per pharmacy, Personal eyedrops will be used. See MAR. Eye drops placed in pts bag with label.

## 2024-08-16 DIAGNOSIS — I5043 Acute on chronic combined systolic (congestive) and diastolic (congestive) heart failure: Secondary | ICD-10-CM | POA: Diagnosis not present

## 2024-08-16 LAB — BASIC METABOLIC PANEL WITH GFR
Anion gap: 10 (ref 5–15)
BUN: 34 mg/dL — ABNORMAL HIGH (ref 8–23)
CO2: 28 mmol/L (ref 22–32)
Calcium: 9 mg/dL (ref 8.9–10.3)
Chloride: 103 mmol/L (ref 98–111)
Creatinine, Ser: 1.5 mg/dL — ABNORMAL HIGH (ref 0.44–1.00)
GFR, Estimated: 33 mL/min — ABNORMAL LOW (ref >60)
Glucose, Bld: 105 mg/dL — ABNORMAL HIGH (ref 70–99)
Potassium: 3.6 mmol/L (ref 3.5–5.1)
Sodium: 141 mmol/L (ref 135–145)

## 2024-08-16 LAB — CBC
HCT: 30.9 % — ABNORMAL LOW (ref 36.0–46.0)
Hemoglobin: 10.1 g/dL — ABNORMAL LOW (ref 12.0–15.0)
MCH: 31.1 pg (ref 26.0–34.0)
MCHC: 32.7 g/dL (ref 30.0–36.0)
MCV: 95.1 fL (ref 80.0–100.0)
Platelets: 139 K/uL — ABNORMAL LOW (ref 150–400)
RBC: 3.25 MIL/uL — ABNORMAL LOW (ref 3.87–5.11)
RDW: 14.2 % (ref 11.5–15.5)
WBC: 8.8 K/uL (ref 4.0–10.5)
nRBC: 0 % (ref 0.0–0.2)

## 2024-08-16 MED ORDER — TORSEMIDE 10 MG PO TABS: 10.0000 mg | ORAL_TABLET | Freq: Every day | ORAL | 3 refills | Status: DC

## 2024-08-16 MED ORDER — ISOSORBIDE MONONITRATE ER 60 MG PO TB24
30.0000 mg | ORAL_TABLET | Freq: Every day | ORAL | Status: DC
  Administered 2024-08-16: 30 mg via ORAL

## 2024-08-16 NOTE — Evaluation (Signed)
 Occupational Therapy Evaluation Patient Details Name: Stephanie Charles MRN: [id number] DOB: 1930-03-05 Today's Date: 08/16/2024   History of Present Illness   Stephanie Charles is a 89 y.o. female with medical history significant of combined chronic HFrEF and HFpEF with LVEF 45-50%, grade 1 diastolic dysfunction, CKD stage IIIa, HTN, hypothyroidism, CAD s/p stenting presented with SOB and chest pain that began just prior to arrival.  Patient underwent Left side cataract surgery 11/11.  EMS noted patient's saturation noted to be 88% on room air, which improved to 96% on 3 L [place].  Also reports chest pain 9/10, that is midsternal, pressure-like and nonradiating, which resolved on my exam. Patient denies abdominal pain, nausea/vomiting, weakness/numbness, changes in vision     Clinical Impressions Upon entering the room, pt supine in bed and agreeable to OT evaluation. Pt reports living with daughter and being Mod I at baseline. Pt performs bed mobility with supervision overall. Pt stand's and ambulates with RW 200' on RA. Pt returning to room and back to bed secondary to fatigue. Pt endorses being close to baseline. Call bell and all needed items within reach. Pt on RA during session with O2 saturation remaining at or above 90%.      If plan is discharge home, recommend the following:   Assistance with cooking/housework;Assist for transportation;Help with stairs or ramp for entrance      Equipment Recommendations   None recommended by OT     Recommendations for Other Services         Precautions/Restrictions   Precautions Precautions: Fall     Mobility Bed Mobility Overal bed mobility: Needs Assistance Bed Mobility: Supine to Sit, Sit to Supine     Supine to sit: Supervision Sit to supine: Supervision        Transfers Overall transfer level: Needs assistance Equipment used: Rolling walker (2 wheels) Transfers: Sit to/from Stand Sit to Stand: Supervision                   Balance Overall balance assessment: Modified Independent                                         ADL either performed or assessed with clinical judgement   ADL Overall ADL's : Needs assistance/impaired                         Toilet Transfer: Supervision/safety;Rolling walker (2 wheels) Toilet Transfer Details (indicate cue type and reason): simulated                 Vision Patient Visual Report: No change from baseline              Pertinent Vitals/Pain Pain Assessment Pain Assessment: No/denies pain     Extremity/Trunk Assessment Upper Extremity Assessment Upper Extremity Assessment: Overall WFL for tasks assessed   Lower Extremity Assessment Lower Extremity Assessment: Overall WFL for tasks assessed       Communication Communication Communication: No apparent difficulties Factors Affecting Communication: Hearing impaired   Cognition Arousal: Alert Behavior During Therapy: WFL for tasks assessed/performed Cognition: No apparent impairments                               Following commands: Intact       Cueing  General Comments   Cueing  Techniques: Verbal cues              Home Living Family/patient expects to be discharged to:: Private residence Living Arrangements: Children (daughter) Available Help at Discharge: Family;Available 24 hours/day Type of Home: House Home Access: Stairs to enter [employer] of Steps: single threshold step Entrance Stairs-Rails: None Home Layout: Able to live on main level with bedroom/bathroom;Two level     Bathroom Shower/Tub: Adult Nurse: Yes   Home Equipment: Rollator (4 wheels);Cane - single point;Hand held shower head;Shower seat   Additional Comments: lives with daughter (retired), granddaughter, and great-grand son (72 yo)      Prior Functioning/Environment Prior Level of Function : Independent/Modified  Independent             Mobility Comments: Occasional use of a rollator initially upon waking but typically does not use an AD with amb, able to amb limited community distances; no home O2; denies fall history within previous six months ADLs Comments: MOD I ADLs and light housework,  family assisting with some IADLs and transportation     AM-PAC OT 6 Clicks Daily Activity     Outcome Measure Help from another person eating meals?: None Help from another person taking care of personal grooming?: None Help from another person toileting, which includes using toliet, bedpan, or urinal?: None Help from another person bathing (including washing, rinsing, drying)?: None Help from another person to put on and taking off regular upper body clothing?: None Help from another person to put on and taking off regular lower body clothing?: None 6 Click Score: 24   End of Session Equipment Utilized During Treatment: Rolling walker (2 wheels) Nurse Communication: Mobility status  Activity Tolerance: Patient tolerated treatment well Patient left: in bed;with call bell/phone within reach;with bed alarm set                   Time: 8985-8971 OT Time Calculation (min): 14 min Charges:  OT General Charges $OT Visit: 1 Visit OT Evaluation $OT Eval Low Complexity: 1 Low  Izetta Claude, MS, OTR/L , CBIS ascom [phone]  08/16/24, 3:20 PM

## 2024-08-16 NOTE — Care Management CC44 (Signed)
 Condition Code 44 Documentation Completed  Patient Details  Name: Stephanie Charles MRN: [id number] Date of Birth: [birth year]   Condition Code 44 given:  Yes Patient signature on Condition Code 44 notice:  Yes Documentation of 2 MD's agreement:  Yes Code 44 added to claim:  Yes    [name] [name], LCSW 08/16/2024, 2:59 PM

## 2024-08-16 NOTE — Discharge Summary (Addendum)
 Physician Discharge Summary   Patient: Stephanie Charles MRN: [id number] DOB: [birth year]  Admit date:     08/15/2024  Discharge date: 08/16/24  Discharge Physician: Laree Lock   PCP: Bertrum Charlie CROME, MD   Recommendations at discharge:   Follow up with PCP in 1 week - Repeat BMP, CBC Monitor BP and resume ARB as tolerated  Follow up with Cardiology in 2 weeks  Discharge Diagnoses: Principal Problem:   Heart failure [facility])   Hospital Course: Stephanie Charles is a 89 y.o. female with medical history significant of combined chronic HFrEF and HFpEF with LVEF 45-50%, grade 1 diastolic dysfunction, CKD stage IIIa, HTN, hypothyroidism, CAD s/p stenting presented with SOB and chest pain that began just prior to arrival.  On presentation required 2 to 3 L [place].  Admitted for acute on chronic heart failure.  Hospital course as below Met daughter Barnie at the bedside, discussed plan of care and answered all questions  Assessment and Plan: Acute on chronic combined HFrEF and HFpEF Acute hypoxic respiratory failure - resolved - Echo 04/09/24: LVEF 45-50, LV global hypokinesis, grade 1 diast df - Elevated troponin, fat - likely secondary to demand ischemia, chest pain resolved. EKG no new changes - Taking Torsemide  10mg  on alternate days based on weight - s/p IV diuresis, discharged on Torsemide  10mg  daily - Continue Carvedilol , Imdur . Held ARB - resume as BP tolerates - Jardiance was discontinued due to UTI's - Follow up with PCP/Cardiology outpatient   CAD w/ hx PCI Hypertensive urgency (resolved), HTN - On Lasix , Coreg , Imdur , asa, plavix  - Resume ARB as BP tolerates outpatient   CKD stage IIIb / ?IV?  - Monitor Cr   Hypothyroidism - Continue Synthroid    S/p Left eye Cataract surgery - Resume home eye drops   Deconditioning - PT/OT eval - no needs  Pain control - [place]  Controlled Substance Reporting System database was reviewed. and patient was instructed, not to  drive, operate heavy machinery, perform activities at heights, swimming or participation in water activities or provide baby-sitting services while on Pain, Sleep and Anxiety Medications; until their outpatient Physician has advised to do so again. Also recommended to not to take more than prescribed Pain, Sleep and Anxiety Medications.  Consultants: None Procedures performed: None  Disposition: Home Diet recommendation:  Discharge Diet Orders (From admission, onward)     Start     Ordered   08/16/24 0000  Diet - low sodium heart healthy       Comments: Fluid restriction   08/16/24 1425           DISCHARGE MEDICATION: Allergies as of 08/16/2024       Reactions   Codeine Nausea And Vomiting, Nausea Only   Levofloxacin Other (See Comments)   Mouth sores   Lisinopril Other (See Comments), Cough   Cough?   Other Itching   Povidone-iodine Other (See Comments), Rash, Dermatitis   Severe blistering and itchiness. Severe redness   Simvastatin Other (See Comments)   Myalgias   Latex Itching        Medication List     PAUSE taking these medications    valsartan  40 MG tablet Wait to take this until your doctor or other care provider tells you to start again. Commonly known as: Diovan  Take 0.5 tablets (20 mg total) by mouth 2 (two) times daily.       TAKE these medications    albuterol  (2.5 MG/3ML) 0.083% nebulizer solution Commonly known as: PROVENTIL   USE 1 VIAL IN NEBULIZER EVERY 4 HOURS AS NEEDED   allopurinol  100 MG tablet Commonly known as: ZYLOPRIM  Take 1 tablet (100 mg total) by mouth daily.   amitriptyline  10 MG tablet Commonly known as: ELAVIL  Take 1 tablet (10 mg total) by mouth at bedtime. What changed: how much to take   Aspirin  Low Dose 81 MG tablet Generic drug: aspirin  EC NEW PRESCRIPTION REQUEST: TAKE ONE TABLET BY MOUTH EVERY DAY   atorvastatin  40 MG tablet Commonly known as: LIPITOR Take 1 tablet by mouth once daily   carvedilol   3.125 MG tablet Commonly known as: COREG  Take 1 tablet (3.125 mg total) by mouth 2 (two) times daily with a meal.   clopidogrel  75 MG tablet Commonly known as: PLAVIX  Take 1 tablet by mouth once daily   cyclobenzaprine  5 MG tablet Commonly known as: FLEXERIL  Take 5 mg by mouth 3 (three) times daily as needed.   ferrous sulfate  325 (65 FE) MG tablet Take 325 mg by mouth at bedtime.   fluorometholone  0.1 % ophthalmic suspension Commonly known as: FML Place 1 drop into both eyes 2 (two) times daily.   gabapentin  100 MG capsule Commonly known as: NEURONTIN  Take 1 capsule by mouth at bedtime.   isosorbide  mononitrate 30 MG 24 hr tablet Commonly known as: IMDUR  Take 1 tablet (30 mg total) by mouth daily.   levothyroxine  125 MCG tablet Commonly known as: SYNTHROID  Take 125 mcg by mouth every morning.   Melatonin 10 MG Tabs Take 10 tablets by mouth at bedtime.   mirtazapine  30 MG tablet Commonly known as: REMERON  TAKE 1 TABLET BY MOUTH AT BEDTIME   nitroGLYCERIN  0.4 MG SL tablet Commonly known as: NITROSTAT  Place 1 tablet (0.4 mg total) under the tongue every 5 (five) minutes as needed for chest pain.   pantoprazole  40 MG tablet Commonly known as: PROTONIX  Take 1 tablet (40 mg total) by mouth daily.   potassium chloride  10 MEQ CR capsule Commonly known as: MICRO-K  Take 10 mEq by mouth as needed.   sertraline  25 MG tablet Commonly known as: ZOLOFT  Take 1 tablet by mouth once daily   torsemide  10 MG tablet Commonly known as: DEMADEX  Take 1 tablet (10 mg total) by mouth daily. What changed: additional instructions        Discharge Exam: Filed Weights   08/15/24 0901 08/16/24 0941  Weight: 68.2 kg 62 kg   Constitutional:      General: not in acute distress. Cardiovascular:     Rate and Rhythm: RRR, regular    Heart sounds: Normal heart sounds.  Pulmonary:     Effort: Pulmonary effort is normal.     Breath sounds: clear to auscultation  bilaterally Abdominal:     Palpations: Abdomen is soft.     Tenderness: There is no abdominal tenderness.  Neurological:     Mental Status: Mental status is at baseline  Ext: no pedal edema bilaterally  Condition at discharge: good  The results of significant diagnostics from this hospitalization (including imaging, microbiology, ancillary and laboratory) are listed below for reference.   Imaging Studies: DG Chest Port 1 View Result Date: 08/15/2024 EXAM: 1 VIEW(S) XRAY OF THE CHEST 08/15/2024 09:21:00 AM COMPARISON: 07/19/2024 CLINICAL HISTORY: SHOB FINDINGS: LUNGS AND PLEURA: Low lung volumes. Bilateral perihilar and subcentral opacities throughout both lungs, consistent with interstitial edema. Eventration of the right hemidiaphragm with blunting of the right costophrenic sulcus. No pneumothorax. HEART AND MEDIASTINUM: Moderate cardiomegaly. Aortic atherosclerosis. BONES AND SOFT TISSUES: No acute  osseous abnormality. IMPRESSION: 1. Moderate cardiomegaly with redemonstrated findings of interstitial edema. 2. Possible trace right pleural effusion. Electronically signed by: Rogelia Myers MD 08/15/2024 09:46 AM EST RP Workstation: [id number]   DG Chest Portable 1 View Result Date: 07/19/2024 EXAM: 1 VIEW(S) XRAY OF THE CHEST 07/19/2024 09:43:00 AM COMPARISON: 05/07/2024 CLINICAL HISTORY: acute hypoxia, suspect pulm edema. Patient presents with acute hypoxia, suspect pulmonary edema. Patient presents from home due to having severe SOB and back pain. Per EMS pt received nitroglycerin  paste and 3 sprays. FINDINGS: LUNGS AND PLEURA: Low lung volumes. Coarsened interstitial markings. No focal pulmonary opacity. No pulmonary edema. No pleural effusion. No pneumothorax. HEART AND MEDIASTINUM: Mild cardiomegaly. Aortic atherosclerosis. BONES AND SOFT TISSUES: No acute osseous abnormality. IMPRESSION: 1. Coarsened interstitial markings, possibly related to low lung volumes or pulmonary edema. 2. Mild  cardiomegaly and aortic atherosclerosis. Electronically signed by: Evalene Coho MD 07/19/2024 09:48 AM EDT RP Workstation: [id number]    Microbiology: Results for orders placed or performed during the hospital encounter of 08/15/24  Resp panel by RT-PCR (RSV, Flu A&B, Covid) Anterior Nasal Swab     Status: None   Collection Time: 08/15/24 10:23 AM   Specimen: Anterior Nasal Swab  Result Value Ref Range Status   SARS Coronavirus 2 by RT PCR NEGATIVE NEGATIVE Final    Comment: (NOTE) SARS-CoV-2 target nucleic acids are NOT DETECTED.  The SARS-CoV-2 RNA is generally detectable in upper respiratory specimens during the acute phase of infection. The lowest concentration of SARS-CoV-2 viral copies this assay can detect is 138 copies/mL. A negative result does not preclude SARS-Cov-2 infection and should not be used as the sole basis for treatment or other patient management decisions. A negative result may occur with  improper specimen collection/handling, submission of specimen other than nasopharyngeal swab, presence of viral mutation(s) within the areas targeted by this assay, and inadequate number of viral copies(<138 copies/mL). A negative result must be combined with clinical observations, patient history, and epidemiological information. The expected result is Negative.  Fact Sheet for Patients:  bloggercourse.com  Fact Sheet for Healthcare Providers:  seriousbroker.it  This test is no t yet approved or cleared by the [place]  FDA and  has been authorized for detection and/or diagnosis of SARS-CoV-2 by FDA under an Emergency Use Authorization (EUA). This EUA will remain  in effect (meaning this test can be used) for the duration of the COVID-19 declaration under Section 564(b)(1) of the Act, 21 U.S.C.section 360bbb-3(b)(1), unless the authorization is terminated  or revoked sooner.       Influenza A by PCR NEGATIVE  NEGATIVE Final   Influenza B by PCR NEGATIVE NEGATIVE Final    Comment: (NOTE) The Xpert Xpress SARS-CoV-2/FLU/RSV plus assay is intended as an aid in the diagnosis of influenza from Nasopharyngeal swab specimens and should not be used as a sole basis for treatment. Nasal washings and aspirates are unacceptable for Xpert Xpress SARS-CoV-2/FLU/RSV testing.  Fact Sheet for Patients: bloggercourse.com  Fact Sheet for Healthcare Providers: seriousbroker.it  This test is not yet approved or cleared by the [place]  FDA and has been authorized for detection and/or diagnosis of SARS-CoV-2 by FDA under an Emergency Use Authorization (EUA). This EUA will remain in effect (meaning this test can be used) for the duration of the COVID-19 declaration under Section 564(b)(1) of the Act, 21 U.S.C. section 360bbb-3(b)(1), unless the authorization is terminated or revoked.     Resp Syncytial Virus by PCR NEGATIVE NEGATIVE Final    Comment: (NOTE)  Fact Sheet for Patients: bloggercourse.com  Fact Sheet for Healthcare Providers: seriousbroker.it  This test is not yet approved or cleared by the [place]  FDA and has been authorized for detection and/or diagnosis of SARS-CoV-2 by FDA under an Emergency Use Authorization (EUA). This EUA will remain in effect (meaning this test can be used) for the duration of the COVID-19 declaration under Section 564(b)(1) of the Act, 21 U.S.C. section 360bbb-3(b)(1), unless the authorization is terminated or revoked.  Performed at [facility], [address] [place], [place] [address]     Labs: CBC: Recent Labs  Lab 08/15/24 0859 08/16/24 0430  WBC 9.5 8.8  NEUTROABS 7.5  --   HGB 11.6* 10.1*  HCT 35.8* 30.9*  MCV 94.0 95.1  PLT 170 139*   Basic Metabolic Panel: Recent Labs  Lab 08/15/24 0859 08/16/24 0430  NA 140 141  K 4.5  3.6  CL 104 103  CO2 25 28  GLUCOSE 115* 105*  BUN 34* 34*  CREATININE 1.39* 1.50*  CALCIUM  9.4 9.0   Liver Function Tests: Recent Labs  Lab 08/15/24 0859  AST 33  ALT 27  ALKPHOS 129*  BILITOT 0.6  PROT 6.6  ALBUMIN  4.2   CBG: No results for input(s): GLUCAP in the last 168 hours.  Discharge time spent: greater than 30 minutes.  Signed: Laree Lock, MD [facility] 08/16/2024

## 2024-08-16 NOTE — Care Management Obs Status (Signed)
 MEDICARE OBSERVATION STATUS NOTIFICATION   Patient Details  Name: Stephanie Charles MRN: [id number] Date of Birth: July 18, 1930   Medicare Observation Status Notification Given:  Yes    Dorthea Maina L Bohdan Macho, LCSW 08/16/2024, 2:59 PM

## 2024-08-16 NOTE — ED Notes (Signed)
 Pt oxygen  showing 83% on the monitor at the nurses station. Went to check on patient. Her pulse ox was not on her hand. Pt states her hand started cramping so she removed it. New pulse ox applied with good wave form. Educated patient on using her call button to notify staff if she removes any equipment. Pt verbalized understanding. Cardiac lead off according to in room monitor. Lead reapplied. Cardiac rhythm showing again on the monitor.

## 2024-08-16 NOTE — Evaluation (Signed)
 Physical Therapy Evaluation Patient Details Name: Stephanie Charles MRN: [id number] DOB: 1930/07/22 Today's Date: 08/16/2024  History of Present Illness  Stephanie Charles is a 89 y.o. female with medical history significant of combined chronic HFrEF and HFpEF with LVEF 45-50%, grade 1 diastolic dysfunction, CKD stage IIIa, HTN, hypothyroidism, CAD s/p stenting presented with SOB and chest pain that began just prior to arrival.  Patient underwent Left side cataract surgery 11/11.  EMS noted patient's saturation noted to be 88% on room air, which improved to 96% on 3 L [place].  Also reports chest pain 9/10, that is midsternal, pressure-like and nonradiating, which resolved on my exam. Patient denies abdominal pain, nausea/vomiting, weakness/numbness, changes in vision  Clinical Impression  Patient noted to be in supine position with HOB elevated at PT and OT arrival in room, for an initial PT evaluation due to a decline in functional status, with baseline mobility reported as independent, and currently requiring no assistance for ADLs and and mobility. The patient is A&O x 4, presenting with good willingness to work with PT and goals of getting better, with discharge expectations that include going back home. The patient resides in a house and lives with children with family/friend support. There are no steps to enter and inside the residence.  Vitals are stable with an SpO? of >95% on RA during bout of ambulation of 200 ft., and gait was assessed with RW. Gait mechanic observations noted mild trendelenburg but demonstrated safe ambulation and good RW management. No activity restrictions, and the overall clinical impression is that the patient presents with little to no mobility limitations secondary to acute cardio-respiratory symptoms. Recommended skilled PT will address safety, mobility, and discharge planning. PT recommendation: safe to go home when medically cleared; pt could benefit from skilled therapy but not  medically necessary.        If plan is discharge home, recommend the following: A little help with bathing/dressing/bathroom;A little help with walking and/or transfers;Assist for transportation   Can travel by private vehicle        Equipment Recommendations None recommended by PT  Recommendations for Other Services       Functional Status Assessment Patient has not had a recent decline in their functional status     Precautions / Restrictions Precautions Precautions: Fall Restrictions Weight Bearing Restrictions Per Provider Order: No      Mobility  Bed Mobility Overal bed mobility: Modified Independent                  Transfers Overall transfer level: Modified independent Equipment used: Rolling walker (2 wheels)                    Ambulation/Gait Ambulation/Gait assistance: Modified independent (Device/Increase time) Gait Distance (Feet): 200 Feet Assistive device: Rolling walker (2 wheels) Gait Pattern/deviations: Trendelenburg, Decreased stride length       General Gait Details: good cadence; no unsteadiness noted able to change directions with RW demonstrating good RW management  Stairs            Wheelchair Mobility     Tilt Bed    Modified Rankin (Stroke Patients Only)       Balance Overall balance assessment: Modified Independent                                           Pertinent Vitals/Pain  Home Living Family/patient expects to be discharged to:: Private residence Living Arrangements: Children Available Help at Discharge: Family;Available 24 hours/day Type of Home: House Home Access: Stairs to enter Entrance Stairs-Rails: None Entrance Stairs-Number of Steps: single threshold step   Home Layout: Able to live on main level with bedroom/bathroom;Two level Home Equipment: Rollator (4 wheels);Cane - single point;Hand held shower head;Shower seat Additional Comments: lives with daughter  (retired), granddaughter, and great-grand son (4 yo)    Prior Function Prior Level of Function : Independent/Modified Independent             Mobility Comments: Occasional use of a rollator initially upon waking but typically does not use an AD with amb, able to amb limited community distances; no home O2; denies fall history within previous six months ADLs Comments: MOD I ADLs and light housework,  family assisting with some IADLs and transportation     Extremity/Trunk Assessment        Lower Extremity Assessment Lower Extremity Assessment: Overall WFL for tasks assessed    Cervical / Trunk Assessment Cervical / Trunk Assessment: Kyphotic  Communication   Communication Communication: No apparent difficulties Factors Affecting Communication: Hearing impaired    Cognition Arousal: Alert Behavior During Therapy: WFL for tasks assessed/performed   PT - Cognitive impairments: No apparent impairments                         Following commands: Intact       Cueing Cueing Techniques: Verbal cues     General Comments      Exercises     Assessment/Plan    PT Assessment Patient does not need any further PT services  PT Problem List         PT Treatment Interventions      PT Goals (Current goals can be found in the Care Plan section)  Acute Rehab PT Goals Patient Stated Goal: Pt wants to get better PT Goal Formulation: With patient Time For Goal Achievement: 08/30/24 Potential to Achieve Goals: Good    Frequency       Co-evaluation PT/OT/SLP Co-Evaluation/Treatment: Yes Reason for Co-Treatment: For patient/therapist safety           AM-PAC PT 6 Clicks Mobility  Outcome Measure Help needed turning from your back to your side while in a flat bed without using bedrails?: None Help needed moving from lying on your back to sitting on the side of a flat bed without using bedrails?: None Help needed moving to and from a bed to a chair  (including a wheelchair)?: None Help needed standing up from a chair using your arms (e.g., wheelchair or bedside chair)?: None Help needed to walk in hospital room?: None Help needed climbing 3-5 steps with a railing? : A Little 6 Click Score: 23    End of Session Equipment Utilized During Treatment: Gait belt Activity Tolerance: Patient tolerated treatment well Patient left: in bed;with nursing/sitter in room;with call bell/phone within reach Nurse Communication: Mobility status PT Visit Diagnosis: Other abnormalities of gait and mobility (R26.89)    Time: 8985-8973 PT Time Calculation (min) (ACUTE ONLY): 12 min   Charges:   PT Evaluation $PT Eval Low Complexity: 1 Low   PT General Charges $$ ACUTE PT VISIT: 1 Visit         Sherlean Lesches DPT, PT   Anacleto Batterman A Andrei Mccook 08/16/2024, 10:40 AM

## 2024-08-17 NOTE — Telephone Encounter (Signed)
   Name: Stephanie Charles  DOB: 1929-10-14  MRN: [id number]  Primary Cardiologist: Evalene Lunger, MD  Chart reviewed as part of pre-operative protocol coverage. Cataract surgeries are normally very low risk and typically do not require specific pre-operative testing. However, patient was recently admitted for acute CHF after cataract surgery on her left eye on 08/14/2024. She was just discharged from the hospital yesterday (08/16/2024). It does not look like Cardiology saw her during recent admission. Given recent admission and advanced age, I think she would benefit from an in-office visit prior to cataract surgery of her right eye.   Pre-op covering staff: - Please schedule appointment and call patient to inform them. If patient already had an upcoming appointment within acceptable timeframe, please add pre-op clearance to the appointment notes so provider is aware. - Please contact requesting surgeon's office via preferred method (i.e, phone, fax) to inform them of need for appointment prior to surgery.   Barnes Florek E Sevag Shearn, PA-C  08/17/2024, 4:11 PM

## 2024-08-17 NOTE — Telephone Encounter (Signed)
 Will send to preop to review new date, though looks to be the pt has been cleared. If still, good I will re-fax notes

## 2024-08-17 NOTE — Telephone Encounter (Signed)
 I s/w the pt and she has been scheduled in office appt 08/22/24 with Tylene Lunch, NP 10:55 for preop clearance.    I will update the surgeon office pt has appt 08/22/24 for preop clearance.    I will confirm with preop APP if appt needs to be with Heart failure or gen card.    I left a message for Jodie at requesting office. Want to confirm this request is for the Right eye   Per preop APP, appt can be with gen card.

## 2024-08-17 NOTE — Telephone Encounter (Signed)
 Patient stated her surgery is now scheduled for 11/25.  Patient called to follow-up on the status of her clearance.  Patient stated can also call [facility], ATTN: Jodie at [phone].

## 2024-08-20 NOTE — Telephone Encounter (Signed)
 Called to confirm the right eye. Please advise

## 2024-08-20 NOTE — Telephone Encounter (Signed)
 Requesting office called today and did confirm this procedure is on the RIGHT EYE 08/28/24.

## 2024-08-22 ENCOUNTER — Encounter: Payer: Self-pay | Admitting: Cardiology

## 2024-08-22 ENCOUNTER — Ambulatory Visit: Attending: Cardiology | Admitting: Cardiology

## 2024-08-22 VITALS — BP 140/60 | HR 51 | Ht 61.0 in | Wt 144.4 lb

## 2024-08-22 DIAGNOSIS — Z01810 Encounter for preprocedural cardiovascular examination: Secondary | ICD-10-CM

## 2024-08-22 DIAGNOSIS — I2510 Atherosclerotic heart disease of native coronary artery without angina pectoris: Secondary | ICD-10-CM | POA: Diagnosis not present

## 2024-08-22 DIAGNOSIS — Z9861 Coronary angioplasty status: Secondary | ICD-10-CM | POA: Diagnosis not present

## 2024-08-22 DIAGNOSIS — E785 Hyperlipidemia, unspecified: Secondary | ICD-10-CM

## 2024-08-22 DIAGNOSIS — I5022 Chronic systolic (congestive) heart failure: Secondary | ICD-10-CM

## 2024-08-22 DIAGNOSIS — I10 Essential (primary) hypertension: Secondary | ICD-10-CM

## 2024-08-22 DIAGNOSIS — I6523 Occlusion and stenosis of bilateral carotid arteries: Secondary | ICD-10-CM

## 2024-08-22 DIAGNOSIS — N183 Chronic kidney disease, stage 3 unspecified: Secondary | ICD-10-CM

## 2024-08-22 DIAGNOSIS — I255 Ischemic cardiomyopathy: Secondary | ICD-10-CM

## 2024-08-22 LAB — OPHTHALMOLOGY REPORT-SCANNED

## 2024-08-22 MED ORDER — VALSARTAN 40 MG PO TABS: 20.0000 mg | ORAL_TABLET | Freq: Every day | ORAL | Status: AC

## 2024-08-22 NOTE — Progress Notes (Signed)
 Note efaxed to : Jaye Fallow, MD

## 2024-08-22 NOTE — Patient Instructions (Addendum)
 Needs follow up with [facility]  Medication Instructions:  Your physician recommends the following medication changes.  DECREASE: Valsartan  to 20 mg daily  *If you need a refill on your cardiac medications before your next appointment, please call your pharmacy*  Lab Work: No labs ordered today  If you have labs (blood work) drawn today and your tests are completely normal, you will receive your results only by: MyChart Message (if you have MyChart) OR A paper copy in the mail If you have any lab test that is abnormal or we need to change your treatment, we will call you to review the results.  Testing/Procedures: No test ordered today   Follow-Up: At [facility], you and your health needs are our priority.  As part of our continuing mission to provide you with exceptional heart care, our providers are all part of one team.  This team includes your primary Cardiologist (physician) and Advanced Practice Providers or APPs (Physician Assistants and Nurse Practitioners) who all work together to provide you with the care you need, when you need it.  Your next appointment:   6 week(s)  Provider:   You may see Timothy Gollan, MD or one of the following Advanced Practice Providers on your designated Care Team:    Tylene Lunch, NP

## 2024-08-22 NOTE — Progress Notes (Signed)
 Cardiology Office Note   Date:  08/22/2024  ID:  [name], DOB 27-Jan-1930, MRN [id number] PCP: Bertrum Charlie CROME, MD  [facility] Providers Cardiologist:  Evalene Lunger, MD Cardiology APP:  Gerard Frederick, NP     History of Present Illness Stephanie Charles is a 89 y.o. female with past medical history of coronary artery disease with prior NSTEMI (2016) and NSTEMI (2020), status post PCI/DES to the LAD, HFimpEF secondary to ischemic cardiomyopathy, labile hypertension, syncope felt to be vasovagal, TIA/posterior CVA, chronic left bundle branch block, carotid artery disease, hypothyroidism, diverticulitis, C. difficile, CKD stage III, anemia, gout, anxiety/depression, who is being seen and evaluated for preprocedure cardiovascular examination prior to cataract surgery.   Stephanie Charles had previously followed with Dr. Bosie and transition to [facility] in 2020.  She was admitted to [facility] in 2020 with an NSTEMI and hypertensive urgency.  Echocardiogram revealed an LVEF 35 to 40%.  Frequent PVCs noted.  Right and left heart catheterization showed mid LAD 95% stenosis status post successful PCI/DES, D2 30% stenosis.  Right heart catheterization showed mean RA pressure of 8, RV pressure 32/7 with a mean of 9, PA pressure 31/16 with a mean of 21, PCWP 10, CO/CI 3.57/2.02.  Echo 09/2019 showed an LVEF of 40-45%, moderate LVH, G1 DD echo in 2022 showed an LVEF of 50-55%.  Repeat echo 10/2021 showed EF 40%.,  Moderate LVH, normal RV SF, frequent PVCs.  Outpatient cardiac monitor showed normal sinus rhythm with an average heart rate of 70 bpm, left bundle branch block, 1 run of nonsustained V. tach and 3.4% PVC burden.   She was previously admitted 07/2023 with chest pain woke up from sleep with elevated blood pressures.  She was treated with IV heparin  x 48 hours.  Echocardiogram showed an LVEF of 55 to 60%.  She was restarted on Entresto .  She has had been a poor cath candidate.  VQ scan was completed and  revealed no PE.  She was evaluated in clinic in February 2025 where amlodipine  was stopped for lower leg edema.  She was again reevaluated at [facility] emergency department in April 2025 with hypertension and nausea.  Blood pressure was 213/102.  Rechecked blood pressure in the emergency department was 172/80.  High-sensitivity troponin of 69 and 67.  Labs are stable.  Chest x-ray and CT of the head were reassuring.  She was considered stable and discharged home.  She was evaluated in clinic 01/12/24 where she was feeling okay.  Blood pressure been running high and she would have headaches and nausea.  She was continued on her current medication regimen with increasing her hydralazine  to 100 mg twice daily.  She was unable to have beta-blocker therapy due to baseline bradycardia and first-degree AV block.  Discussed noninvasive testing due to minimally elevated troponin in the setting of severe hypertension with recent hospitalization and she declined until her follow-up appointment with her primary cardiologist.  She was seen in clinic by Dr. Gollan 02/17/2024 that time continued to have shortness of breath on exertion.  She denies any chest pain concerning for angina.  No medication changes were made.  Given age and debility felt she was not a candidate for further invasive ischemic studies such as cardiac catheterization medical management was continued.  She presented to the [facility] emergency department on 04/08/2024 with complaints of shortness of breath muscle spasm and chest pain.  She was noted to be tachypneic with respirations of 40 and oxygen  saturations were 85%  on room air.  She was placed on 4 L of O2 via nasal cannula.  BNP was elevated at 359 troponin was 468.  She was diagnosed with acute congestive heart failure started on IV Lasix  daily that had to be held due to worsening renal function.  Entresto  was Parthenia continued due to worsening renal function.  Losartan  was added.  She was diuresed and was  able to be discharged 04/12/2024.  She was again hospitalized at [facility] from 8//05/11/2024 with acute on chronic HFrEF.  She arrived by EMS from home with shortness of breath and bilateral lower extremity edema.  She was continued on antibiotics for UTI.  Upon arrival to the hospital she had significant hypoxia requiring BiPAP.  Recent echocardiogram revealed LVEF of 45 to 50% with evidence of congestive heart failure.  She was started on IV furosemide .  She developed acute renal failure with furosemide  and was given albumin .  Renal function was improving.  Lasix  continued to be placed on hold and she was considered medically stable for discharge.  She was hospitalized 10/16 - 07/21/2024 at [facility] for acute on chronic HFrEF.-She presented with worsening shortness of breath.  She had generalized weakness and exertional dyspnea.  She required 2 L of O2 via nasal cannula.  Chest x-ray revealed pulmonary congestion and she was diuresed.  Serum creatinine was 1.65 which was close to baseline.  Creatinine remained stable and she was feeling better not needing oxygen  and was ambulating and was considered stable for discharge home.  She was hospitalized again 11/20 - 11/13/125 for acute on chronic heart failure exacerbation.  She presented with shortness of breath and chest pain that began just prior to arrival.  On presentation she required 2 to 3 L of O2 via nasal cannula.  She was admitted for acute on chronic heart failure.  Hospital course per notes she was taking torsemide  10 mg alternating days based on weight.  ARB was held Jardiance was discontinued due to recurrent UTIs.  After she was diuresed and she was considered stable and was discharged home.   She presents to clinic today accompanied by her daughter.  She has several questions related to medications and changes that were made during her recent hospitalization.  She also has questions related to the things that she can do at home to prevent the buildup of  fluid again.  She denies any chest pain, shortness of breath, palpitations.  She does continues to have some occasional peripheral edema which she has compression stockings on today.  She states that she has been compliant with the current medication regimen that she was given on discharge from the hospital.  She also states that she has follow-up this afternoon with her PCP to have her labs drawn.  ROS: 10 point review of system has been reviewed and considered negative with exception of what is been listed in the HPI  Studies Reviewed EKG Interpretation Date/Time:  Wednesday August 22 2024 11:16:33 EST Ventricular Rate:  51 PR Interval:  216 QRS Duration:  152 QT Interval:  504 QTC Calculation: 464 R Axis:   -13  Text Interpretation: Sinus bradycardia with sinus arrhythmia with 1st degree A-V block Left bundle branch block , questionable blocked PAC When compared with ECG of 15-Aug-2024 08:55, No significant change was found Confirmed by Gerard Frederick ([id number]) on 08/22/2024 12:48:25 PM    04/09/2024 Echo complete 1. Left ventricular ejection fraction, by estimation, is 45 to 50%. The  left ventricle has mildly decreased function.  The left ventricle  demonstrates global hypokinesis. There is moderate concentric left  ventricular hypertrophy. Left ventricular  diastolic parameters are consistent with Grade I diastolic dysfunction  (impaired relaxation).   2. Right ventricular systolic function is normal. The right ventricular  size is normal.   3. Left atrial size was mildly dilated.   4. Right atrial size was mildly dilated.   5. The mitral valve is grossly normal. Trivial mitral valve  regurgitation.   6. The aortic valve is normal in structure. Aortic valve regurgitation is  not visualized.   Echo 07/2023 1. Left ventricular ejection fraction, by estimation, is 55 to 60%. The  left ventricle has normal function. The left ventricle has no regional  wall motion abnormalities.  There is mild left ventricular hypertrophy.  Left ventricular diastolic parameters  are indeterminate.   2. Right ventricular systolic function is normal. The right ventricular  size is normal.   3. Left atrial size was moderately dilated.   4. The mitral valve is normal in structure. Mild mitral valve  regurgitation.   5. The aortic valve was not well visualized. Aortic valve regurgitation  is trivial.    Carotid US  12/2022  Summary:  Right Carotid: Velocities in the right ICA are consistent with a 1-39%  stenosis.                Non-hemodynamically significant plaque <50% noted in the  CCA. The                 ECA appears >50% stenosed. Tortuous CCA.   Left Carotid: Velocities in the left ICA are consistent with a 1-39%  stenosis.               Non-hemodynamically significant plaque <50% noted in the  CCA. The                ECA appears <50% stenosed.    R/L heart cath 2020 Prox LAD lesion is 90% stenosed. Prox Cx to Mid Cx lesion is 40% stenosed. Prox RCA lesion is 30% stenosed.  Risk Assessment/Calculations     Physical Exam VS:  BP (!) 140/60 (BP Location: Left Arm, Patient Position: Sitting, Cuff Size: Normal)   Pulse (!) 51   Ht 5' 1 (1.549 m)   Wt 144 lb 6.4 oz (65.5 kg)   SpO2 96%   BMI 27.28 kg/m        Wt Readings from Last 3 Encounters:  08/22/24 144 lb 6.4 oz (65.5 kg)  08/16/24 136 lb 11 oz (62 kg)  08/14/24 146 lb (66.2 kg)    GEN: Well nourished, well developed in no acute distress NECK: No JVD; No carotid bruits CARDIAC: RRR, no murmurs, rubs, gallops RESPIRATORY:  Clear to auscultation without rales, wheezing or rhonchi  ABDOMEN: Soft, non-tender, non-distended EXTREMITIES: Trace pretibial edema; No deformity   ASSESSMENT AND PLAN Chronic HFmrEF with multiple hospitalizations for heart failure exacerbation versus flash pulmonary edema.  Most recent echocardiogram revealed LVEF of 45 to 50% with global hypokinesis and G1 DD.  She received IV  furosemide  but had a bump in kidney function and that diuretic had to be discontinued.  She was discharged in the hospital and continued on torsemide  at 10 mg daily carvedilol  was decreased to 3.125 mg twice daily and Entresto  was placed on hold.  She states that her weight has been stable at home.  Unable to escalate GDMT with SGLT2 inhibitor or MRA therapy due to renal dysfunction and history  of recurrent UTI.  She has been restarted on a half of valsartan  dosing today and 20 mg to assist in just with blood pressure.  She had several medications that were discontinued that she is unsure of but why they were discontinued.  She has been continued on diuretic therapy, beta-blocker, and ARB was readded.  She has follow-up labs scheduled with her PCP this afternoon to reevaluate kidney function.  She has been encouraged to continue to weigh yourself daily and to log her foods and no fluids as talking to her and her daughter she seems to have a high sodium intake within her diet.  Encouraged to keep follow-up appointments with advanced heart failure.  CKD stage IIIa-IV with baseline serum creatinine 1.5-1.7.  Had been as high as 2.78 and had improved with furosemide  and ARB being placed on hold during recent hospitalization.  She does have follow-up labs being placed today.  She is continued on torsemide  10 mg daily and restarted on low-dose valsartan .  Coronary artery disease with previously elevated mild troponins not consistent with ACS.  Continues to deny any chest comfort.  EKG today reveals sinus bradycardia sinus arrhythmia rate of 51 with first degree AV block and a chronic left bundle branch block with possible blocked PAC.  She is continued on aspirin  81 mg daily, clopidogrel  75 mg daily, atorvastatin  40 mg daily.  No further ischemic evaluation at this time.  Hyperlipidemia with last LDL 31.  She remains a goal.  She is continued on atorvastatin  40 mg daily.  Chronic left bundle branch block noted on  the EKG that is unchanged from prior studies.  Will continue to monitor with surveillance studies.  Carotid artery stenosis with a right ICA consistent with 1-39% stenosis nonhemodynamically significant plaque noted in the CCA and the ECA appears less than 50% stenosed with a torturous CCA.  Left carotid ICA consistent with 1-39% stenosis, nonhemodynamically significant plaque less than 50% noted in the CCA and the ECA appears less than 50% stenosis.  Continued on aspirin  and statin therapy.  Preprocedure cardiovascular examination    Ms. Luhrs's perioperative risk of a major cardiac event is 6.6% according to the Revised Cardiac Risk Index (RCRI).  Therefore, she is at high risk for perioperative complications.   Her functional capacity is fair at 4.64 METs according to the Duke Activity Status Index (DASI). Recommendations: According to ACC/AHA guidelines, no further cardiovascular testing needed.  The patient may proceed to surgery at acceptable risk.          Dispo: Patient to return to clinic see MD/APP in 6 weeks or sooner if needed for further evaluation  Signed, Cesareo Vickrey, NP

## 2024-08-23 ENCOUNTER — Encounter: Payer: Self-pay | Admitting: Ophthalmology

## 2024-08-23 NOTE — Anesthesia Preprocedure Evaluation (Addendum)
 Anesthesia Evaluation  Patient identified by MRN, date of birth, ID band Patient awake    Reviewed: Allergy & Precautions, H&P , NPO status , Patient's Chart, lab work & pertinent test results  Airway Mallampati: III  TM Distance: <3 FB Neck ROM: Full    Dental no notable dental hx. (+) Upper Dentures, Lower Dentures   Pulmonary neg pulmonary ROS, shortness of breath, pneumonia   Pulmonary exam normal breath sounds clear to auscultation       Cardiovascular hypertension, + angina  + CAD, + Past MI and +CHF  Normal cardiovascular exam+ dysrhythmias  Rhythm:Regular Rate:Normal  01. Left ventricular ejection fraction, by estimation, is 45 to 50%. The  left ventricle has mildly decreased function. The left ventricle  demonstrates global hypokinesis. There is moderate concentric left  ventricular hypertrophy. Left ventricular  diastolic parameters are consistent with Grade I diastolic dysfunction  (impaired relaxation).   2. Right ventricular systolic function is normal. The right ventricular  size is normal.   3. Left atrial size was mildly dilated.   4. Right atrial size was mildly dilated.   5. The mitral valve is grossly normal. Trivial mitral valve  regurgitation.   6. The aortic valve is normal in structure. Aortic valve regurgitation is  not visualized.  04-09-24 echo   08-15-24 EKG when patient in pulmonary edema in ER  ECG rate:  111   ECG rate assessment: tachycardic     Rhythm: sinus tachycardia     Ectopy: none     Conduction: normal   Comments:     Left bundle branch block   Date/Time:                  Wednesday August 22 2024 11:16:33 EST office visit Ventricular Rate:         51 PR Interval:                 216 QRS Duration:             152 QT Interval:                 504 QTC Calculation:464 R Axis:                         -13   Text Interpretation:Sinus bradycardia with sinus arrhythmia with 1st  degree A-V block Left bundle branch block , questionable blocked PAC When compared with ECG of 15-Aug-2024 08:55, No significant change was found Confirmed by Gerard Frederick ([id number]) on 08/22/2024 12:48:25 PM    08-22-24 Preprocedure cardiovascular examination    Stephanie Charles's perioperative risk of a major cardiac event is 6.6% according to the Revised Cardiac Risk Index (RCRI).  Therefore, she is at high risk for perioperative complications.   Her functional capacity is fair at 4.64 METs according to the Duke Activity Status Index (DASI). Recommendations: According to ACC/AHA guidelines, no further cardiovascular testing needed.  The patient may proceed to surgery at acceptable risk.         Neuro/Psych  PSYCHIATRIC DISORDERS Anxiety Depression    TIAnegative neurological ROS  negative psych ROS   GI/Hepatic negative GI ROS, Neg liver ROS,GERD  ,,  Endo/Other  negative endocrine ROSHypothyroidism    Renal/GU Renal diseasenegative Renal ROS  negative genitourinary   Musculoskeletal negative musculoskeletal ROS (+) Arthritis ,    Abdominal   Peds negative pediatric ROS (+)  Hematology negative hematology ROS (+) Blood dyscrasia, anemia   Anesthesia  Other Findings Previous cataract surgery 08-14-24 Dr. Ola. Next day after surgery, Patient Ended up in ER and then ICU, with pulmonary edema, hypoxic, placed on 02 3L/m/n/c, lasix ,   Only gets few milliliters of IV meds and IV saline flush with cataract surgery, so did not get fluid overloaded intra-op.   Hypertension Hypothyroidism Myocardial infarction (HCC) GERD (gastroesophageal reflux disease) Hyperlipidemia Diverticulitis Vertigo [name] TIA (transient ischemic attack) Anemia CHF (congestive heart failure) (HCC) C. difficile diarrhea LBBB (left bundle branch block) Carotid artery disease Anxiety Depression CAD (coronary artery disease) H/O heart artery stent First degree AV block Syncope Grade I  diastolic dysfunction Chronic kidney disease, stage 3a (HCC) Aortic atherosclerosis  History of cardiomegaly NSTEMI (non-ST elevated myocardial infarction) (HCC) NSTEMI (non-ST elevated myocardial infarction) (HCC) CKD (chronic kidney disease), stage IIIa-IV (HCC) Pulmonary edema    Reproductive/Obstetrics negative OB ROS                              Anesthesia Physical Anesthesia Plan  ASA: 3  Anesthesia Plan: MAC   Post-op Pain Management:    Induction: Intravenous  PONV Risk Score and Plan:   Airway Management Planned: Natural Airway and Nasal Cannula  Additional Equipment:   Intra-op Plan:   Post-operative Plan:   Informed Consent: I have reviewed the patients History and Physical, chart, labs and discussed the procedure including the risks, benefits and alternatives for the proposed anesthesia with the patient or authorized representative who has indicated his/her understanding and acceptance.     Dental Advisory Given  Plan Discussed with: Anesthesiologist, CRNA and Surgeon  Anesthesia Plan Comments: (Patient consented for risks of anesthesia including but not limited to:  - adverse reactions to medications - damage to eyes, teeth, lips or other oral mucosa - nerve damage due to positioning  - sore throat or hoarseness - Damage to heart, brain, nerves, lungs, other parts of body or loss of life  Patient voiced understanding and assent.)         Anesthesia Quick Evaluation

## 2024-08-23 NOTE — Discharge Instructions (Signed)

## 2024-08-24 ENCOUNTER — Encounter: Payer: Self-pay | Admitting: Ophthalmology

## 2024-08-24 ENCOUNTER — Telehealth: Payer: Self-pay | Admitting: Family

## 2024-08-24 NOTE — Telephone Encounter (Signed)
 Called to confirm/remind patient of their appointment at [facility] on 08/27/24.   Appointment:   [x] Confirmed  [] Left mess   [] No answer/No voice mail  [] VM Full/unable to leave message  [] Phone not in service  Patient reminded to bring all medications and/or complete list.  Confirmed patient has transportation. Gave directions, instructed to utilize valet parking.

## 2024-08-26 NOTE — Progress Notes (Unsigned)
 Advanced [facility] Failure Clinic Note   Referring Physician: 07/25 admission PCP: Bertrum Charlie CROME, MD Cardiologist: Evalene Lunger, MD   Chief Complaint: shortness of breath   HPI:  Stephanie Charles is a 89 y/o female with a history of LBBB, TIA/CVA in 2016, CKD stage II, HTN, HLD, anemia, carotid artery disease, NSTEMI 06/2019 (PCI/DES to the LAD), NSTEMI 2016, hypothyroidism, PAD, anxiety, depression and chronic heart failure.    Admitted to [facility] in 06/2019 with NSTEMI and HTN. Echo 06/06/2019 showed LVEF 35-40%. Frequenct PVCs noted. R/L heart cath 06/07/2019 showed mid LAD 95% stenosis s/p successful PCI/DES, D2 30% stenosis. RHC showed mean RA pressure of 8, RV pressure 32/7 with a mean of 9, PA pressure 31/16 with a mean of 21, PCWP 10, CO/CI 3.57/2.02.   Admitted 07/2023 with chest pain that woke her up from sleep with elevated blood pressure.  She was treated with IV heparin  x 48 hours.  Echo showed EF of 55 to 60%.  She was restarted on Entresto .  She was felt to be a poor cardiac cath candidate. VQ scan showed no PE.   Went to the ER 01/06/24 for HTN and nausea. At home BP was 213/102. BP in the ER 172/80. HS trop 69>67.Labs were stable. CXR and CT head were reassuring. She was discharged home.   Admitted 04/08/24 with chest pain and shortness of breath. Chest x-ray showed acute pulmonary edema. BNP 359, troponin 468. She is diagnosed with acute congestive heart failure, started on IV Lasix  daily, but this had to be held due to worsening renal function. Echo 04/09/24: EF 45-50% with moderate LVH, G1DD, normal RV. Weaned off oxygen . Entresto  / diuretics held at discharge due to renal function. Losartan  was added.   Admitted 05/07/24 with shortness of breath and bilateral pedal edema due to HF exacerbation initially needing bipap. On antibiotics due to UTI. IV diuresed and then developed AKI. Given albumin , lasix  held and renal function improved. Elevated troponin thought to be due to demand  ischemia.  Admitted 07/19/24 with worsening SOB. HTN with sats in the 70's. CXR showed pulmonary congestion. IV diuresed.   Was in the ED 08/15/24 with SOB and chest pain. Placed on 3L with sats rising into the 90's. Elevated troponin thought to be due to demand ischemia. IV diuresed and torsemide  changed to daily usage. ARB held. Jardiance  stopped due to frequent UTI's.   She presents today, with her daughter, for a post-hospital HF visit with a chief complaint of shortness of breath. Occurring with little exertion. Has associated fatigue, slight pedal edema. She says that she feels like she's retaining fluid again. Admits to feeling anxious especially at night because she's afraid that she's going to go to sleep and then wake up short of breath. Also notices some cloudy appearing urine. No dysuria / hematuria / mental status changes.   Previous cardiac studies: Echo 09/18/2019: EF 40 to 45% Echo 03/07/21: EF 50-55%, normal RV, moderate LVH Echo 10/07/21: EF 40% Echo 07/29/23: EF 55 to 60% Echo 04/09/24: EF 45-50% with moderate LVH, G1DD, normal RV  ROS: All systems negative except what is listed in HPI, PMH and Problem List  Past Medical History:  Diagnosis Date   Anemia    Anxiety    Aortic atherosclerosis    C. difficile diarrhea    CAD (coronary artery disease)    Carotid artery disease    CHF (congestive heart failure) (HCC) 10/05/2021   Chronic kidney disease, stage 3a (HCC)  CKD (chronic kidney disease), stage III (HCC)    Depression    Diverticulitis    First degree AV block    GERD (gastroesophageal reflux disease)    Grade I diastolic dysfunction 04/09/2024   H/O heart artery stent    Coronary disease, stent placed to proximal to mid LAD   History of cardiomegaly    Hyperlipidemia    Hypertension    Hypothyroidism    LBBB (left bundle branch block)    Myocardial infarction (HCC) 08/2017   NSTEMI (non-ST elevated myocardial infarction) (HCC) 2016   NSTEMI (non-ST  elevated myocardial infarction) (HCC) 2020   Pulmonary edema 08/15/2024   the day after cataract surgery   Syncope    TIA (transient ischemic attack)    1 approx 2015, 1 approx 2018   Vertigo    last episode several months ago   [name]     Current Outpatient Medications  Medication Sig Dispense Refill   albuterol  (PROVENTIL ) (2.5 MG/3ML) 0.083% nebulizer solution USE 1 VIAL IN NEBULIZER EVERY 4 HOURS AS NEEDED 180 mL 6   allopurinol  (ZYLOPRIM ) 100 MG tablet Take 1 tablet (100 mg total) by mouth daily. 30 tablet 0   amitriptyline  (ELAVIL ) 10 MG tablet Take 1 tablet (10 mg total) by mouth at bedtime. 90 tablet 3   ASPIRIN  LOW DOSE 81 MG EC tablet NEW PRESCRIPTION REQUEST: TAKE ONE TABLET BY MOUTH EVERY DAY 90 tablet 3   atorvastatin  (LIPITOR) 40 MG tablet Take 1 tablet by mouth once daily 90 tablet 3   carvedilol  (COREG ) 3.125 MG tablet Take 1 tablet (3.125 mg total) by mouth 2 (two) times daily with a meal. 180 tablet 3   clopidogrel  (PLAVIX ) 75 MG tablet Take 1 tablet by mouth once daily 90 tablet 3   cyclobenzaprine  (FLEXERIL ) 5 MG tablet Take 5 mg by mouth 3 (three) times daily as needed. (Patient not taking: Reported on 08/22/2024)     ferrous sulfate  325 (65 FE) MG tablet Take 325 mg by mouth at bedtime.     fluorometholone  (FML) 0.1 % ophthalmic suspension Place 1 drop into both eyes 2 (two) times daily.     gabapentin  (NEURONTIN ) 100 MG capsule Take 1 capsule by mouth at bedtime.     isosorbide  mononitrate (IMDUR ) 30 MG 24 hr tablet Take 1 tablet (30 mg total) by mouth daily. 30 tablet 0   levothyroxine  (SYNTHROID ) 125 MCG tablet Take 125 mcg by mouth every morning.     Melatonin 10 MG TABS Take 10 tablets by mouth at bedtime.     mirtazapine  (REMERON ) 30 MG tablet TAKE 1 TABLET BY MOUTH AT BEDTIME (Patient not taking: Reported on 08/22/2024) 90 tablet 3   nitroGLYCERIN  (NITROSTAT ) 0.4 MG SL tablet Place 1 tablet (0.4 mg total) under the tongue every 5 (five)  minutes as needed for chest pain. 25 tablet 3   pantoprazole  (PROTONIX ) 40 MG tablet Take 1 tablet (40 mg total) by mouth daily. (Patient not taking: Reported on 08/22/2024) 90 tablet 3   potassium chloride  (MICRO-K ) 10 MEQ CR capsule Take 10 mEq by mouth as needed.     sertraline  (ZOLOFT ) 25 MG tablet Take 1 tablet by mouth once daily 90 tablet 3   torsemide  (DEMADEX ) 10 MG tablet Take 1 tablet (10 mg total) by mouth daily. 90 tablet 3   valsartan  (DIOVAN ) 40 MG tablet Take 0.5 tablets (20 mg total) by mouth daily.     No current facility-administered medications for  this visit.    Allergies  Allergen Reactions   Codeine Nausea And Vomiting and Nausea Only   Levofloxacin Other (See Comments)    Mouth sores   Lisinopril Other (See Comments) and Cough    Cough?   Other Itching   Povidone-Iodine Other (See Comments), Rash and Dermatitis    Severe blistering and itchiness. Severe redness   Simvastatin Other (See Comments)    Myalgias   Latex Itching      Social History   Socioeconomic History   Marital status: Divorced    Spouse name: na   Number of children: 4   Years of education: 14   Highest education level: Associate degree: occupational, scientist, product/process development, or vocational program  Occupational History   Occupation: retired  Tobacco Use   Smoking status: Never   Smokeless tobacco: Never  Vaping Use   Vaping status: Never Used  Substance and Sexual Activity   Alcohol use: No    Alcohol/week: 0.0 standard drinks of alcohol   Drug use: No   Sexual activity: Never  Other Topics Concern   Not on file  Social History Narrative   Not on file   Social Drivers of Health   Financial Resource Strain: Low Risk  (07/31/2024)   Received from [facility]   Overall Financial Resource Strain (CARDIA)    Difficulty of Paying Living Expenses: Not hard at all  Food Insecurity: No Food Insecurity (07/31/2024)   Received from [facility]   Hunger Vital  Sign    Within the past 12 months, you worried that your food would run out before you got the money to buy more.: Never true    Within the past 12 months, the food you bought just didn't last and you didn't have money to get more.: Never true  Transportation Needs: No Transportation Needs (07/31/2024)   Received from [facility] - Transportation    In the past 12 months, has lack of transportation kept you from medical appointments or from getting medications?: No    Lack of Transportation (Non-Medical): No  Physical Activity: Inactive (07/31/2024)   Received from [facility]   Exercise Vital Sign    On average, how many days per week do you engage in moderate to strenuous exercise (like a brisk walk)?: 0 days    On average, how many minutes do you engage in exercise at this level?: 0 min  Stress: No Stress Concern Present (08/18/2020)   [employer] - Occupational Stress Questionnaire    Feeling of Stress : Not at all  Social Connections: Socially Isolated (05/08/2024)   Social Connection and Isolation Panel    Frequency of Communication with Friends and Family: Never    Frequency of Social Gatherings with Friends and Family: More than three times a week    Attends Religious Services: Never    Database Administrator or Organizations: No    Attends Banker Meetings: Never    Marital Status: Divorced  Catering Manager Violence: Not At Risk (05/08/2024)   Humiliation, Afraid, Rape, and Kick questionnaire    Fear of Current or Ex-Partner: No    Emotionally Abused: No    Physically Abused: No    Sexually Abused: No      Family History  Problem Relation Age of Onset   Heart disease Mother    Hypertension Mother    Stroke Mother    Heart disease  Father    Breast cancer Sister    Alzheimer's disease Brother    Heart attack Brother    Stroke Brother    Heart disease Brother    Mental illness  Brother        died suicide   Cancer Brother        prostate   Heart disease Brother        atrial fib   Alzheimer's disease Brother    Clotting disorder Daughter    Fibromyalgia Daughter    Vitals:   08/27/24 1140  BP: (!) 125/47  Pulse: (!) 54  SpO2: 93%  Weight: 144 lb 2 oz (65.4 kg)   Wt Readings from Last 3 Encounters:  08/27/24 144 lb 2 oz (65.4 kg)  08/22/24 144 lb 6.4 oz (65.5 kg)  08/16/24 136 lb 11 oz (62 kg)   Lab Results  Component Value Date   CREATININE 1.50 (H) 08/16/2024   CREATININE 1.39 (H) 08/15/2024   CREATININE 2.10 (H) 07/21/2024     PHYSICAL EXAM:  General: Well appearing elderly female.  Cor: No JVD. Regular rhythm, bradycardic.  Lungs: clear Abdomen: soft, nontender, nondistended. Extremities: 1+ pitting edema bilateral lower legs Neuro:. Affect pleasant  ReDs reading: 45 %, abnormal   ECG: not done   ASSESSMENT & PLAN:  1: ICM with mildly reduced ejection fraction- - NYHA Charles III - fluid up with worsening symptoms and elevated ReDs reading of 45% - discussed sending for outpatient IV Lasix  vs doubling current diuretic. She declines IV lasix  today as she doesn't want to go anywhere else today. Will double torsemide  to 20mg  BID/ potassium 20meq BID X 1 week until seen again in [facility] - discussed palliative care but patient declines that referral at this time. Will revisit this at future visits.  - Echo 09/18/2019: EF 40 to 45% - Echo 03/07/21: EF 50-55%, normal RV, moderate LVH - Echo 10/07/21: EF 40% - Echo 07/29/23: EF 55 to 60% - Echo 04/09/24: EF 45-50% with moderate LVH, G1DD, normal RV.  - weight down 7 pounds from last visit here 6 weeks ago - continue carvedilol  3.125mg  BID - continue valsartan  20mg  BID. When she took 40mg  at one time, she developed HA / diarrhea - unable to tolerate jardiance  due to UTI's - BMET next week - Explained the importance of keeping her BP under control yet managing her fluid retention & kidney  function - Wearing compression socks daily - BNP 05/07/24 was 585.4  2: HTN- - BP 125/47 - continue  isosorbide  MN, valsartan  - saw PCP Garnet) 11/25 - CMP 08/23/24 reviewed: sodium 144, potassium 4.1, creatinine 1.8 & GFR 26  3: CAD- - saw cardiology Reggy) 11/25 - continue plavix  75mg  daily - NSTEMI 2016 - NSTEMI 06/2019 (PCI/DES to the LAD), - R/L heart cath 06/07/2019 showed mid LAD 95% stenosis s/p successful PCI/DES, D2 30% stenosis. RHC showed mean RA pressure of 8, RV pressure 32/7 with a mean of 9, PA pressure 31/16 with a mean of 21, PCWP 10, CO/CI 3.57/2.02.   4: Anemia- - Hg 08/23/24 was 12.2 - continue oral iron  5: Hyperlipidemia- - continue atorvastatin  40mg  daily - LDL 07/31/24 was 51   Urine was checked at PCP office last week but they hadn't heard whether they should begin antibiotics or not. She had taken 1 days worth right before her recent admission. Since mental status is clear, will have them hold off on resuming antibiotics. Kidney function is not great and since we  are increasing torsemide  today due to being fluid overloaded, want to not stress the kidneys any more than possible. Should her daughter start to notice any mental status changes, they will begin antibiotic at that time.    Return in 1 week, sooner if needed.   I spent 39 minutes reviewing records, interviewing/ examing patient and managing plan/ orders.   Stephanie DELENA Class, FNP-C 08/26/24

## 2024-08-27 ENCOUNTER — Encounter: Payer: Self-pay | Admitting: Family

## 2024-08-27 ENCOUNTER — Ambulatory Visit: Attending: Family | Admitting: Family

## 2024-08-27 VITALS — BP 125/47 | HR 54 | Wt 144.1 lb

## 2024-08-27 DIAGNOSIS — I130 Hypertensive heart and chronic kidney disease with heart failure and stage 1 through stage 4 chronic kidney disease, or unspecified chronic kidney disease: Secondary | ICD-10-CM | POA: Insufficient documentation

## 2024-08-27 DIAGNOSIS — R600 Localized edema: Secondary | ICD-10-CM | POA: Diagnosis not present

## 2024-08-27 DIAGNOSIS — D500 Iron deficiency anemia secondary to blood loss (chronic): Secondary | ICD-10-CM

## 2024-08-27 DIAGNOSIS — R001 Bradycardia, unspecified: Secondary | ICD-10-CM | POA: Insufficient documentation

## 2024-08-27 DIAGNOSIS — I5020 Unspecified systolic (congestive) heart failure: Secondary | ICD-10-CM | POA: Diagnosis present

## 2024-08-27 DIAGNOSIS — E782 Mixed hyperlipidemia: Secondary | ICD-10-CM

## 2024-08-27 DIAGNOSIS — I10 Essential (primary) hypertension: Secondary | ICD-10-CM | POA: Diagnosis not present

## 2024-08-27 DIAGNOSIS — D631 Anemia in chronic kidney disease: Secondary | ICD-10-CM | POA: Insufficient documentation

## 2024-08-27 DIAGNOSIS — I255 Ischemic cardiomyopathy: Secondary | ICD-10-CM | POA: Diagnosis not present

## 2024-08-27 DIAGNOSIS — Z87440 Personal history of urinary (tract) infections: Secondary | ICD-10-CM | POA: Insufficient documentation

## 2024-08-27 DIAGNOSIS — I2510 Atherosclerotic heart disease of native coronary artery without angina pectoris: Secondary | ICD-10-CM | POA: Diagnosis not present

## 2024-08-27 DIAGNOSIS — F32A Depression, unspecified: Secondary | ICD-10-CM | POA: Diagnosis not present

## 2024-08-27 DIAGNOSIS — Z79899 Other long term (current) drug therapy: Secondary | ICD-10-CM | POA: Insufficient documentation

## 2024-08-27 DIAGNOSIS — Z9861 Coronary angioplasty status: Secondary | ICD-10-CM

## 2024-08-27 DIAGNOSIS — Z7902 Long term (current) use of antithrombotics/antiplatelets: Secondary | ICD-10-CM | POA: Insufficient documentation

## 2024-08-27 DIAGNOSIS — Z8673 Personal history of transient ischemic attack (TIA), and cerebral infarction without residual deficits: Secondary | ICD-10-CM | POA: Insufficient documentation

## 2024-08-27 DIAGNOSIS — Z955 Presence of coronary angioplasty implant and graft: Secondary | ICD-10-CM | POA: Diagnosis not present

## 2024-08-27 DIAGNOSIS — E039 Hypothyroidism, unspecified: Secondary | ICD-10-CM | POA: Diagnosis not present

## 2024-08-27 DIAGNOSIS — E785 Hyperlipidemia, unspecified: Secondary | ICD-10-CM | POA: Insufficient documentation

## 2024-08-27 DIAGNOSIS — I252 Old myocardial infarction: Secondary | ICD-10-CM | POA: Insufficient documentation

## 2024-08-27 DIAGNOSIS — F419 Anxiety disorder, unspecified: Secondary | ICD-10-CM | POA: Insufficient documentation

## 2024-08-27 DIAGNOSIS — I5022 Chronic systolic (congestive) heart failure: Secondary | ICD-10-CM | POA: Insufficient documentation

## 2024-08-27 DIAGNOSIS — N1831 Chronic kidney disease, stage 3a: Secondary | ICD-10-CM | POA: Diagnosis not present

## 2024-08-27 NOTE — Patient Instructions (Signed)
 Medication Changes:  DOUBLE Torsemide  to 20mg  (2 tabs) two times daily  DOUBLE Potassium to 20meq (2 tabs) daily   Follow-Up in: Please follow up with the Advanced Heart Failure Clinic in 1 week with Ellouise Class, FNP.   Thank you for choosing [facility].    At the Advanced Heart Failure Clinic, you and your health needs are our priority. We have a designated team specialized in the treatment of Heart Failure. This Care Team includes your primary Heart Failure Specialized Cardiologist (physician), Advanced Practice Providers (APPs- Physician Assistants and Nurse Practitioners), and Pharmacist who all work together to provide you with the care you need, when you need it.   You may see any of the following providers on your designated Care Team at your next follow up:  Dr. Toribio Fuel Dr. Ezra Shuck Dr. Ria Commander Dr. Morene Brownie Ellouise Class, FNP Jaun Bash, RPH-CPP  Please be sure to bring in all your medications bottles to every appointment.   Need to Contact Us :  If you have any questions or concerns before your next appointment please send us  a message through [place] or call our office at [phone].    TO LEAVE A MESSAGE FOR THE NURSE SELECT OPTION 2, PLEASE LEAVE A MESSAGE INCLUDING: YOUR NAME DATE OF BIRTH CALL BACK NUMBER REASON FOR CALL**this is important as we prioritize the call backs  YOU WILL RECEIVE A CALL BACK THE SAME DAY AS LONG AS YOU CALL BEFORE 4:00 PM

## 2024-08-27 NOTE — Progress Notes (Signed)
 ReDS Vest / Clip - 08/27/24 1200       ReDS Vest / Clip   Station Marker B    Ruler Value 28    ReDS Value Range High volume overload    ReDS Actual Value 45

## 2024-08-28 ENCOUNTER — Other Ambulatory Visit: Payer: Self-pay | Admitting: Family

## 2024-08-28 ENCOUNTER — Telehealth: Payer: Self-pay | Admitting: Family

## 2024-08-28 ENCOUNTER — Ambulatory Visit: Payer: Self-pay | Admitting: Anesthesiology

## 2024-08-28 ENCOUNTER — Telehealth: Payer: Self-pay | Admitting: Cardiovascular Disease

## 2024-08-28 ENCOUNTER — Encounter
Admission: RE | Disposition: A | Discharge status: Home / Self Care | Payer: Self-pay | Source: Home / Self Care | Attending: Ophthalmology

## 2024-08-28 ENCOUNTER — Ambulatory Visit
Admission: RE | Admit: 2024-08-28 | Discharge: 2024-08-28 | Disposition: A | Discharge status: Home / Self Care | Attending: Ophthalmology | Admitting: Ophthalmology

## 2024-08-28 ENCOUNTER — Encounter: Payer: Self-pay | Admitting: Ophthalmology

## 2024-08-28 DIAGNOSIS — Z604 Social exclusion and rejection: Secondary | ICD-10-CM | POA: Insufficient documentation

## 2024-08-28 DIAGNOSIS — I2510 Atherosclerotic heart disease of native coronary artery without angina pectoris: Secondary | ICD-10-CM | POA: Insufficient documentation

## 2024-08-28 DIAGNOSIS — K219 Gastro-esophageal reflux disease without esophagitis: Secondary | ICD-10-CM | POA: Diagnosis not present

## 2024-08-28 DIAGNOSIS — I252 Old myocardial infarction: Secondary | ICD-10-CM | POA: Diagnosis not present

## 2024-08-28 DIAGNOSIS — H2511 Age-related nuclear cataract, right eye: Secondary | ICD-10-CM | POA: Insufficient documentation

## 2024-08-28 DIAGNOSIS — N1831 Chronic kidney disease, stage 3a: Secondary | ICD-10-CM | POA: Diagnosis not present

## 2024-08-28 DIAGNOSIS — I509 Heart failure, unspecified: Secondary | ICD-10-CM | POA: Insufficient documentation

## 2024-08-28 DIAGNOSIS — I5020 Unspecified systolic (congestive) heart failure: Secondary | ICD-10-CM

## 2024-08-28 DIAGNOSIS — I130 Hypertensive heart and chronic kidney disease with heart failure and stage 1 through stage 4 chronic kidney disease, or unspecified chronic kidney disease: Secondary | ICD-10-CM | POA: Diagnosis not present

## 2024-08-28 HISTORY — DX: Atherosclerosis of aorta: I70.0

## 2024-08-28 HISTORY — DX: Chronic kidney disease, stage 3 unspecified: N18.30

## 2024-08-28 HISTORY — PX: CATARACT EXTRACTION W/PHACO: SHX586

## 2024-08-28 HISTORY — DX: Personal history of other diseases of the circulatory system: Z86.79

## 2024-08-28 SURGERY — PHACOEMULSIFICATION, CATARACT, WITH IOL INSERTION
Anesthesia: Monitor Anesthesia Care | Laterality: Right

## 2024-08-28 MED ORDER — SIGHTPATH DOSE#1 BSS IO SOLN
INTRAOCULAR | Status: DC | PRN
  Administered 2024-08-28: 15 mL via INTRAOCULAR

## 2024-08-28 MED ORDER — MOXIFLOXACIN HCL 0.5 % OP SOLN
OPHTHALMIC | Status: DC | PRN
Start: 2024-08-28 — End: 2024-08-28
  Administered 2024-08-28: .2 mL via OPHTHALMIC

## 2024-08-28 MED ORDER — FENTANYL CITRATE (PF) 100 MCG/2ML IJ SOLN
INTRAMUSCULAR | Status: DC | PRN
  Administered 2024-08-28: 25 ug via INTRAVENOUS

## 2024-08-28 MED ORDER — TETRACAINE HCL 0.5 % OP SOLN
1.0000 [drp] | OPHTHALMIC | Status: DC | PRN
  Administered 2024-08-28 (×3): 1 [drp] via OPHTHALMIC

## 2024-08-28 MED ORDER — PHENYLEPHRINE HCL 10 % OP SOLN
OPHTHALMIC | Status: AC
  Filled 2024-08-28: qty 5

## 2024-08-28 MED ORDER — TETRACAINE HCL 0.5 % OP SOLN
OPHTHALMIC | Status: AC
  Filled 2024-08-28: qty 4

## 2024-08-28 MED ORDER — SIGHTPATH DOSE#1 BSS IO SOLN
INTRAOCULAR | Status: DC | PRN
  Administered 2024-08-28: 90 mL via OPHTHALMIC

## 2024-08-28 MED ORDER — SIGHTPATH DOSE#1 NA CHONDROIT SULF-NA HYALURON 40-17 MG/ML IO SOLN
INTRAOCULAR | Status: DC | PRN
  Administered 2024-08-28: 1 mL via INTRAOCULAR

## 2024-08-28 MED ORDER — LIDOCAINE HCL (PF) 2 % IJ SOLN
INTRAOCULAR | Status: DC | PRN
  Administered 2024-08-28: 1 mL

## 2024-08-28 MED ORDER — FENTANYL CITRATE (PF) 100 MCG/2ML IJ SOLN
INTRAMUSCULAR | Status: AC
  Filled 2024-08-28: qty 2

## 2024-08-28 MED ORDER — CYCLOPENTOLATE HCL 2 % OP SOLN
OPHTHALMIC | Status: AC
  Filled 2024-08-28: qty 2

## 2024-08-28 MED ORDER — PHENYLEPHRINE HCL 10 % OP SOLN
1.0000 [drp] | OPHTHALMIC | Status: AC
Start: 2024-08-28 — End: 2024-08-28
  Administered 2024-08-28 (×3): 1 [drp] via OPHTHALMIC

## 2024-08-28 MED ORDER — CYCLOPENTOLATE HCL 2 % OP SOLN
1.0000 [drp] | OPHTHALMIC | Status: AC
Start: 2024-08-28 — End: 2024-08-28
  Administered 2024-08-28 (×3): 1 [drp] via OPHTHALMIC

## 2024-08-28 MED ORDER — BRIMONIDINE TARTRATE-TIMOLOL 0.2-0.5 % OP SOLN
OPHTHALMIC | Status: DC | PRN
Start: 2024-08-28 — End: 2024-08-28
  Administered 2024-08-28: 1 [drp] via OPHTHALMIC

## 2024-08-28 SURGICAL SUPPLY — 10 items
CANNULA ANT/CHMB 27G (MISCELLANEOUS) ×1 IMPLANT
CYSTOTOME ANGL RVRS SHRT 25G (CUTTER) ×1 IMPLANT
FEE CATARACT SUITE SIGHTPATH (MISCELLANEOUS) ×1 IMPLANT
GLOVE BIOGEL PI IND STRL 8 (GLOVE) ×1 IMPLANT
GLOVE SURG SYN 6.5 PF PI BL (GLOVE) ×1 IMPLANT
GLOVE SURG SYN 8.0 PF PI (GLOVE) IMPLANT
LENS IOL TECNIS EYHANCE 23.5 (Intraocular Lens) IMPLANT
NDL FILTER BLUNT 18X1 1/2 (NEEDLE) ×1 IMPLANT
RING MALYGIN (MISCELLANEOUS) IMPLANT
SYR 3ML LL SCALE MARK (SYRINGE) ×1 IMPLANT

## 2024-08-28 NOTE — Telephone Encounter (Signed)
 STAT if HR is under 50 or over 120 (normal HR is 60-100 beats per minute)  What is your heart rate?   41-44  Do you have a log of your heart rate readings (document readings)?   Do you have any other symptoms?   No  Caller (Dr. Ola) stated patient is in their office now and is having slow heart rate and wants advice on next steps.

## 2024-08-28 NOTE — Anesthesia Postprocedure Evaluation (Signed)
 Anesthesia Post Note  Patient: Talbert MALVA Dine  Procedure(s) Performed: PHACOEMULSIFICATION, CATARACT, WITH IOL INSERTION 25.16, 02:05.3 (Right)  Patient location during evaluation: PACU Anesthesia Type: MAC Level of consciousness: awake and alert Pain management: pain level controlled Vital Signs Assessment: post-procedure vital signs reviewed and stable Respiratory status: spontaneous breathing, nonlabored ventilation, respiratory function stable and patient connected to nasal cannula oxygen  Cardiovascular status: stable and blood pressure returned to baseline Postop Assessment: no apparent nausea or vomiting Anesthetic complications: no Comments: Postop heart rate 41-44, BP stable, patient denies dizziness.  EKG showed LBBB, PVCs, sinus bradycardia.  I spoke on phone with Dr. Gollan.He doesn't want her to take Coreg  tonight, and to check the heart rate and if it's below 55, don't take Coreg  tomorrow.  If it's still slow tomorrow, or if she has further problems call office and let him know. He may consider a zio patch. Dr. Gollan said patient may be discharged.  I discussed w/her family member and with patient.  Showed them on their cell phone, not to take coreg  (carvedilol ) if heart rate below 55 and communicated Dr. Tresia instructions.  Both agreed they understood, and would comply.    No notable events documented.   Last Vitals:  Vitals:   08/28/24 0950 08/28/24 1000  BP: (!) 146/58 (!) 149/66  Pulse: (!) 45 (!) 43  Resp: 16 (!) 23  Temp: 36.6 C 36.6 C  SpO2: 91% 95%    Last Pain:  Vitals:   08/28/24 1000  TempSrc:   PainSc: 0-No pain                 Layan Zalenski C Adelfo Diebel

## 2024-08-28 NOTE — H&P (Signed)
 [facility]   Primary Care Physician:  Bertrum Charlie CROME, MD Ophthalmologist: Dr. Elsie Carmine  Pre-Procedure History & Physical: HPI:  Stephanie Charles is a 89 y.o. female here for cataract surgery.   Past Medical History:  Diagnosis Date   Anemia    Anxiety    Aortic atherosclerosis    C. difficile diarrhea    CAD (coronary artery disease)    Carotid artery disease    CHF (congestive heart failure) (HCC) 10/05/2021   Chronic kidney disease, stage 3a (HCC)    CKD (chronic kidney disease), stage III (HCC)    Depression    Diverticulitis    First degree AV block    GERD (gastroesophageal reflux disease)    Grade I diastolic dysfunction 04/09/2024   H/O heart artery stent    Coronary disease, stent placed to proximal to mid LAD   History of cardiomegaly    Hyperlipidemia    Hypertension    Hypothyroidism    LBBB (left bundle branch block)    Myocardial infarction (HCC) 08/2017   NSTEMI (non-ST elevated myocardial infarction) (HCC) 2016   NSTEMI (non-ST elevated myocardial infarction) (HCC) 2020   Pulmonary edema 08/15/2024   the day after cataract surgery   Syncope    TIA (transient ischemic attack)    1 approx 2015, 1 approx 2018   Vertigo    last episode several months ago   [name]     Past Surgical History:  Procedure Laterality Date   CATARACT EXTRACTION W/PHACO Left 08/14/2024   Procedure: PHACOEMULSIFICATION, CATARACT, WITH IOL INSERTION 34.50 02:34.7;  Surgeon: Carmine Elsie, MD;  Location: [facility];  Service: Ophthalmology;  Laterality: Left;   COLONOSCOPY WITH PROPOFOL  N/A 06/22/2018   Procedure: COLONOSCOPY WITH PROPOFOL ;  Surgeon: Therisa Bi, MD;  Location: [facility] ENDOSCOPY;  Service: Endoscopy;  Laterality: N/A;   CORONARY STENT INTERVENTION N/A 06/07/2019   Procedure: CORONARY STENT INTERVENTION;  Surgeon: Darron Deatrice LABOR, MD;  Location: [facility] INVASIVE CV LAB;  Service: Cardiovascular;  Laterality: N/A;    ESOPHAGOGASTRODUODENOSCOPY (EGD) WITH PROPOFOL  N/A 06/22/2018   Procedure: ESOPHAGOGASTRODUODENOSCOPY (EGD) WITH PROPOFOL ;  Surgeon: Therisa Bi, MD;  Location: [facility] ENDOSCOPY;  Service: Endoscopy;  Laterality: N/A;   KNEE ARTHROSCOPY Right    REPLACEMENT TOTAL KNEE BILATERAL     RIGHT/LEFT HEART CATH AND CORONARY ANGIOGRAPHY N/A 06/07/2019   Procedure: RIGHT/LEFT HEART CATH AND CORONARY ANGIOGRAPHY;  Surgeon: Perla Evalene PARAS, MD;  Location: [facility] INVASIVE CV LAB;  Service: Cardiovascular;  Laterality: N/A;   SHOULDER SURGERY Left     Prior to Admission medications   Medication Sig Start Date End Date Taking? Authorizing Provider  albuterol  (PROVENTIL ) (2.5 MG/3ML) 0.083% nebulizer solution USE 1 VIAL IN NEBULIZER EVERY 4 HOURS AS NEEDED 01/04/22  Yes Bertrum Charlie CROME, MD  allopurinol  (ZYLOPRIM ) 100 MG tablet Take 1 tablet (100 mg total) by mouth daily. 07/21/24  Yes Alexander, Natalie, DO  amitriptyline  (ELAVIL ) 10 MG tablet Take 1 tablet (10 mg total) by mouth at bedtime. 12/27/22  Yes Gasper Nancyann BRAVO, MD  ASPIRIN  LOW DOSE 81 MG EC tablet NEW PRESCRIPTION REQUEST: TAKE ONE TABLET BY MOUTH EVERY DAY 08/03/21  Yes Bertrum Charlie CROME, MD  atorvastatin  (LIPITOR) 40 MG tablet Take 1 tablet by mouth once daily 03/23/24  Yes Gollan, Timothy J, MD  carvedilol  (COREG ) 3.125 MG tablet Take 1 tablet (3.125 mg total) by mouth 2 (two) times daily with a meal. 11/08/23  Yes Gollan, Evalene PARAS,  MD  clopidogrel  (PLAVIX ) 75 MG tablet Take 1 tablet by mouth once daily 04/05/24  Yes Gollan, Timothy J, MD  cyclobenzaprine  (FLEXERIL ) 5 MG tablet Take 5 mg by mouth 3 (three) times daily as needed. 09/13/23  Yes [provider]  ferrous sulfate  325 (65 FE) MG tablet Take 325 mg by mouth at bedtime.   Yes [provider]  fluorometholone  (FML) 0.1 % ophthalmic suspension Place 1 drop into both eyes 2 (two) times daily. 01/17/24  Yes [provider]  gabapentin  (NEURONTIN ) 100 MG capsule Take 1  capsule by mouth at bedtime. 11/10/23  Yes [provider]  isosorbide  mononitrate (IMDUR ) 30 MG 24 hr tablet Take 1 tablet (30 mg total) by mouth daily. 07/21/24  Yes Alexander, Natalie, DO  levothyroxine  (SYNTHROID ) 125 MCG tablet Take 125 mcg by mouth every morning.   Yes [provider]  Melatonin 10 MG TABS Take 10 tablets by mouth at bedtime.   Yes [provider]  mirtazapine  (REMERON ) 30 MG tablet TAKE 1 TABLET BY MOUTH AT BEDTIME 04/21/22  Yes Bertrum Charlie CROME, MD  nitroGLYCERIN  (NITROSTAT ) 0.4 MG SL tablet Place 1 tablet (0.4 mg total) under the tongue every 5 (five) minutes as needed for chest pain. 04/09/22  Yes Gollan, Timothy J, MD  potassium chloride  (MICRO-K ) 10 MEQ CR capsule Take 10 mEq by mouth 2 (two) times daily. Takes with every 10mg  torsemide    Yes [provider]  sertraline  (ZOLOFT ) 25 MG tablet Take 1 tablet by mouth once daily 04/21/22  Yes Bertrum Charlie CROME, MD  torsemide  (DEMADEX ) 10 MG tablet Take 10 mg by mouth 2 (two) times daily. 08/22/24  Yes [provider]  valsartan  (DIOVAN ) 40 MG tablet Take 0.5 tablets (20 mg total) by mouth daily. Patient taking differently: Take 20 mg by mouth 2 (two) times daily. 08/22/24  Yes Hammock, Tylene, NP    Allergies as of 08/01/2024 - Review Complete 07/30/2024  Allergen Reaction Noted   Codeine Nausea And Vomiting and Nausea Only 04/08/2014   Levofloxacin Other (See Comments) 03/11/2015   Lisinopril Other (See Comments) and Cough 06/14/2017   Other Itching 11/09/2022   Povidone-iodine Other (See Comments), Rash, and Dermatitis 09/22/2017   Simvastatin Other (See Comments) 03/11/2015   Latex Itching 06/08/2019    Family History  Problem Relation Age of Onset   Heart disease Mother    Hypertension Mother    Stroke Mother    Heart disease Father    Breast cancer Sister    Alzheimer's disease Brother    Heart attack Brother    Stroke Brother    Heart disease Brother     Mental illness Brother        died suicide   Cancer Brother        prostate   Heart disease Brother        atrial fib   Alzheimer's disease Brother    Clotting disorder Daughter    Fibromyalgia Daughter     Social History   Socioeconomic History   Marital status: Divorced    Spouse name: na   Number of children: 4   Years of education: 14   Highest education level: Associate degree: occupational, scientist, product/process development, or vocational program  Occupational History   Occupation: retired  Tobacco Use   Smoking status: Never   Smokeless tobacco: Never  Vaping Use   Vaping status: Never Used  Substance and Sexual Activity   Alcohol use: No    Alcohol/week: 0.0  standard drinks of alcohol   Drug use: No   Sexual activity: Never  Other Topics Concern   Not on file  Social History Narrative   Not on file   Social Drivers of Health   Financial Resource Strain: Low Risk  (07/31/2024)   Received from [facility]   Overall Financial Resource Strain (CARDIA)    Difficulty of Paying Living Expenses: Not hard at all  Food Insecurity: No Food Insecurity (07/31/2024)   Received from [facility]   Hunger Vital Sign    Within the past 12 months, you worried that your food would run out before you got the money to buy more.: Never true    Within the past 12 months, the food you bought just didn't last and you didn't have money to get more.: Never true  Transportation Needs: No Transportation Needs (07/31/2024)   Received from [facility] - Transportation    In the past 12 months, has lack of transportation kept you from medical appointments or from getting medications?: No    Lack of Transportation (Non-Medical): No  Physical Activity: Inactive (07/31/2024)   Received from [facility]   Exercise Vital Sign    On average, how many days per week do you engage in moderate to strenuous exercise (like a brisk walk)?: 0  days    On average, how many minutes do you engage in exercise at this level?: 0 min  Stress: No Stress Concern Present (08/18/2020)   [employer] - Occupational Stress Questionnaire    Feeling of Stress : Not at all  Social Connections: Socially Isolated (05/08/2024)   Social Connection and Isolation Panel    Frequency of Communication with Friends and Family: Never    Frequency of Social Gatherings with Friends and Family: More than three times a week    Attends Religious Services: Never    Database Administrator or Organizations: No    Attends Banker Meetings: Never    Marital Status: Divorced  Catering Manager Violence: Not At Risk (05/08/2024)   Humiliation, Afraid, Rape, and Kick questionnaire    Fear of Current or Ex-Partner: No    Emotionally Abused: No    Physically Abused: No    Sexually Abused: No    Review of Systems: See HPI, otherwise negative ROS  Physical Exam: BP (!) 159/76   Pulse 80   Temp 98.1 F (36.7 C) (Temporal)   Resp (!) 21   Wt 64.4 kg   SpO2 96%   BMI 26.83 kg/m  General:   Alert, cooperative. Head:  Normocephalic and atraumatic. Respiratory:  Normal work of breathing. Cardiovascular:  NAD  Impression/Plan: Stephanie Charles is here for cataract surgery.  Risks, benefits, limitations, and alternatives regarding cataract surgery have been reviewed with the patient.  Questions have been answered.  All parties agreeable.   Elsie Carmine, MD  08/28/2024, 9:18 AM

## 2024-08-28 NOTE — Transfer of Care (Signed)
 Immediate Anesthesia Transfer of Care Note  Patient: Stephanie Charles  Procedure(s) Performed: PHACOEMULSIFICATION, CATARACT, WITH IOL INSERTION 25.16, 02:05.3 (Right)  Patient Location: PACU  Anesthesia Type: MAC  Level of Consciousness: awake, alert  and patient cooperative  Airway and Oxygen  Therapy: Patient Spontanous Breathing and Patient connected to supplemental oxygen   Post-op Assessment: Post-op Vital signs reviewed, Patient's Cardiovascular Status Stable, Respiratory Function Stable, Patent Airway and No signs of Nausea or vomiting  Post-op Vital Signs: Reviewed and stable  Complications: No notable events documented.

## 2024-08-28 NOTE — Op Note (Signed)
 PREOPERATIVE DIAGNOSIS:  Nuclear sclerotic cataract of the right eye.   POSTOPERATIVE DIAGNOSIS:  Right Eye Cataract   OPERATIVE PROCEDURE:ORPROCALL@   SURGEON:  Elsie Carmine, MD.   ANESTHESIA:  Anesthesiologist: Ola Donny BROCKS, MD CRNA: Jahoo, Sonia, CRNA  1.      Managed anesthesia care. 2.      0.52ml of Shugarcaine was instilled in the eye following the paracentesis.   COMPLICATIONS: Viscoelastic was used to raise the pupil margin.  A  Malyugin ring was placed as the pupil would not achieve sufficient pharmacologic dilation to undergo cataract extraction safely.( The ring was removed atraumatically following insertion of the IOL.)    TECHNIQUE:   Stop and chop   DESCRIPTION OF PROCEDURE:  The patient was examined and consented in the preoperative holding area where the aforementioned topical anesthesia was applied to the right eye and then brought back to the Operating Room where the right eye was prepped and draped in the usual sterile ophthalmic fashion and a lid speculum was placed. A paracentesis was created with the side port blade and the anterior chamber was filled with viscoelastic. A near clear corneal incision was performed with the steel keratome. A continuous curvilinear capsulorrhexis was performed with a cystotome followed by the capsulorrhexis forceps. Hydrodissection and hydrodelineation were carried out with BSS on a blunt cannula. The lens was removed in a stop and chop  technique and the remaining cortical material was removed with the irrigation-aspiration handpiece. The capsular bag was inflated with viscoelastic and the intraocular lens was placed in the capsular bag without complication. The remaining viscoelastic was removed from the eye with the irrigation-aspiration handpiece. The wounds were hydrated. The anterior chamber was flushed with BSS and the eye was inflated to physiologic pressure. 0.13ml of Vigamox  was placed in the anterior chamber. The wounds were  found to be water tight. The eye was dressed with Combigan . The patient was given protective glasses to wear throughout the day and a shield with which to sleep tonight. The patient was also given drops with which to begin a drop regimen today and will follow-up with me in one day. Implant Name Type Inv. Item Serial No. Manufacturer Lot No. LRB No. Used Action  LENS IOL TECNIS EYHANCE 23.5 - [id number] Intraocular Lens LENS IOL TECNIS EYHANCE 23.5 [id number] [employer]  Right 1 Implanted   Procedure(s): PHACOEMULSIFICATION, CATARACT, WITH IOL INSERTION 25.16, 02:05.3 (Right)  Electronically signed: Elsie Carmine 08/28/2024 9:48 AM

## 2024-08-28 NOTE — Telephone Encounter (Signed)
 Per Ellouise, its ok for pt to have IV lasix  on Friday 08/31/24. Scheduled pt with same day surgery. Pt verbalized agreement.

## 2024-08-28 NOTE — Telephone Encounter (Signed)
 Dr. Perla spoke with Dr. Ola.

## 2024-08-29 ENCOUNTER — Telehealth: Payer: Self-pay | Admitting: Family

## 2024-08-29 NOTE — Telephone Encounter (Signed)
 Called to confirm/remind patient of [facility] on 09/[facility].   Appointment:   [x] Confirmed  [] Left mess   [] No answer/No voice mail  [] VM Full/unable to leave message  [] Phone not in service  Patient reminded to bring all medications and/or complete list.  Confirmed patient has transportation. Gave directions, instructed to utilize valet parking.

## 2024-08-31 ENCOUNTER — Ambulatory Visit
Admission: RE | Admit: 2024-08-31 | Discharge: 2024-08-31 | Disposition: A | Discharge status: Home / Self Care | Source: Ambulatory Visit | Attending: Family | Admitting: Family

## 2024-08-31 DIAGNOSIS — I5020 Unspecified systolic (congestive) heart failure: Secondary | ICD-10-CM | POA: Diagnosis present

## 2024-08-31 LAB — BASIC METABOLIC PANEL WITH GFR
Anion gap: 10 (ref 5–15)
BUN: 67 mg/dL — ABNORMAL HIGH (ref 8–23)
CO2: 26 mmol/L (ref 22–32)
Calcium: 9.3 mg/dL (ref 8.9–10.3)
Chloride: 106 mmol/L (ref 98–111)
Creatinine, Ser: 2.16 mg/dL — ABNORMAL HIGH (ref 0.44–1.00)
GFR, Estimated: 21 mL/min — ABNORMAL LOW (ref >60)
Glucose, Bld: 92 mg/dL (ref 70–99)
Potassium: 5 mmol/L (ref 3.5–5.1)
Sodium: 142 mmol/L (ref 135–145)

## 2024-08-31 LAB — PRO BRAIN NATRIURETIC PEPTIDE: Pro Brain Natriuretic Peptide: 4757 pg/mL — ABNORMAL HIGH (ref <300.0)

## 2024-08-31 MED ORDER — FUROSEMIDE 10 MG/ML IJ SOLN
INTRAMUSCULAR | Status: AC
  Filled 2024-08-31: qty 8

## 2024-08-31 MED ORDER — FUROSEMIDE 10 MG/ML IJ SOLN
80.0000 mg | Freq: Once | INTRAMUSCULAR | Status: AC
  Administered 2024-08-31: 80 mg via INTRAVENOUS

## 2024-08-31 MED ORDER — POTASSIUM CHLORIDE CRYS ER 20 MEQ PO TBCR
EXTENDED_RELEASE_TABLET | ORAL | Status: AC
Start: 2024-08-31 — End: 2024-08-31
  Filled 2024-08-31: qty 2

## 2024-08-31 MED ORDER — POTASSIUM CHLORIDE CRYS ER 20 MEQ PO TBCR
40.0000 meq | EXTENDED_RELEASE_TABLET | Freq: Once | ORAL | Status: AC
  Administered 2024-08-31: 40 meq via ORAL

## 2024-09-02 ENCOUNTER — Ambulatory Visit: Payer: Self-pay | Admitting: Family

## 2024-09-02 NOTE — Progress Notes (Deleted)
 [facility] Note   Referring Physician: 07/25 admission PCP: Bertrum Charlie CROME, MD Cardiologist: Evalene Lunger, MD   Chief Complaint: shortness of breath   HPI:  [name] is a 89 y/o female with a history of LBBB, TIA/CVA in 2016, CKD stage II, HTN, HLD, anemia, carotid artery disease, NSTEMI 06/2019 (PCI/DES to the LAD), NSTEMI 2016, hypothyroidism, PAD, anxiety, depression and chronic heart failure.    Admitted to [facility] in 06/2019 with NSTEMI and HTN. Echo 06/06/2019 showed LVEF 35-40%. Frequenct PVCs noted. R/L heart cath 06/07/2019 showed mid LAD 95% stenosis s/p successful PCI/DES, D2 30% stenosis. RHC showed mean RA pressure of 8, RV pressure 32/7 with a mean of 9, PA pressure 31/16 with a mean of 21, PCWP 10, CO/CI 3.57/2.02.   Admitted 07/2023 with chest pain that woke her up from sleep with elevated blood pressure.  She was treated with IV heparin  x 48 hours.  Echo showed EF of 55 to 60%.  She was restarted on Entresto .  She was felt to be a poor cardiac cath candidate. VQ scan showed no PE.   Went to the ER 01/06/24 for HTN and nausea. At home BP was 213/102. BP in the ER 172/80. HS trop 69>67.Labs were stable. CXR and CT head were reassuring. She was discharged home.   Admitted 04/08/24 with chest pain and shortness of breath. Chest x-ray showed acute pulmonary edema. BNP 359, troponin 468. She is diagnosed with acute congestive heart failure, started on IV Lasix  daily, but this had to be held due to worsening renal function. Echo 04/09/24: EF 45-50% with moderate LVH, G1DD, normal RV. Weaned off oxygen . Entresto  / diuretics held at discharge due to renal function. Losartan  was added.   Admitted 05/07/24 with shortness of breath and bilateral pedal edema due to HF exacerbation initially needing bipap. On antibiotics due to UTI. IV diuresed and then developed AKI. Given albumin , lasix  held and renal function improved. Elevated troponin thought to be due to demand  ischemia.  Admitted 07/19/24 with worsening SOB. HTN with sats in the 70's. CXR showed pulmonary congestion. IV diuresed.   Was in the ED 08/15/24 with SOB and chest pain. Placed on 3L with sats rising into the 90's. Elevated troponin thought to be due to demand ischemia. IV diuresed and torsemide  changed to daily usage. ARB held. Jardiance  stopped due to frequent UTI's.   She presents today, with her daughter, for a post-hospital HF visit with a chief complaint of shortness of breath. Occurring with little exertion. Has associated fatigue, slight pedal edema. She says that she feels like she's retaining fluid again. Admits to feeling anxious especially at night because she's afraid that she's going to go to sleep and then wake up short of breath. Also notices some cloudy appearing urine. No dysuria / hematuria / mental status changes.   Previous cardiac studies: Echo 09/18/2019: EF 40 to 45% Echo 03/07/21: EF 50-55%, normal RV, moderate LVH Echo 10/07/21: EF 40% Echo 07/29/23: EF 55 to 60% Echo 04/09/24: EF 45-50% with moderate LVH, G1DD, normal RV  ROS: All systems negative except what is listed in HPI, PMH and Problem List  Past Medical History:  Diagnosis Date   Anemia    Anxiety    Aortic atherosclerosis    C. difficile diarrhea    CAD (coronary artery disease)    Carotid artery disease    CHF (congestive heart failure) (HCC) 10/05/2021   Chronic kidney disease, stage 3a (HCC)  CKD (chronic kidney disease), stage III (HCC)    Depression    Diverticulitis    First degree AV block    GERD (gastroesophageal reflux disease)    Grade I diastolic dysfunction 04/09/2024   H/O heart artery stent    Coronary disease, stent placed to proximal to mid LAD   History of cardiomegaly    Hyperlipidemia    Hypertension    Hypothyroidism    LBBB (left bundle branch block)    Myocardial infarction (HCC) 08/2017   NSTEMI (non-ST elevated myocardial infarction) (HCC) 2016   NSTEMI (non-ST  elevated myocardial infarction) (HCC) 2020   Pulmonary edema 08/15/2024   the day after cataract surgery   Syncope    TIA (transient ischemic attack)    1 approx 2015, 1 approx 2018   Vertigo    last episode several months ago   [name]     Current Outpatient Medications  Medication Sig Dispense Refill   albuterol  (PROVENTIL ) (2.5 MG/3ML) 0.083% nebulizer solution USE 1 VIAL IN NEBULIZER EVERY 4 HOURS AS NEEDED 180 mL 6   allopurinol  (ZYLOPRIM ) 100 MG tablet Take 1 tablet (100 mg total) by mouth daily. 30 tablet 0   amitriptyline  (ELAVIL ) 10 MG tablet Take 1 tablet (10 mg total) by mouth at bedtime. 90 tablet 3   ASPIRIN  LOW DOSE 81 MG EC tablet NEW PRESCRIPTION REQUEST: TAKE ONE TABLET BY MOUTH EVERY DAY 90 tablet 3   atorvastatin  (LIPITOR) 40 MG tablet Take 1 tablet by mouth once daily 90 tablet 3   carvedilol  (COREG ) 3.125 MG tablet Take 1 tablet (3.125 mg total) by mouth 2 (two) times daily with a meal. 180 tablet 3   clopidogrel  (PLAVIX ) 75 MG tablet Take 1 tablet by mouth once daily 90 tablet 3   cyclobenzaprine  (FLEXERIL ) 5 MG tablet Take 5 mg by mouth 3 (three) times daily as needed.     ferrous sulfate  325 (65 FE) MG tablet Take 325 mg by mouth at bedtime.     fluorometholone  (FML) 0.1 % ophthalmic suspension Place 1 drop into both eyes 2 (two) times daily.     gabapentin  (NEURONTIN ) 100 MG capsule Take 1 capsule by mouth at bedtime.     isosorbide  mononitrate (IMDUR ) 30 MG 24 hr tablet Take 1 tablet (30 mg total) by mouth daily. 30 tablet 0   levothyroxine  (SYNTHROID ) 125 MCG tablet Take 125 mcg by mouth every morning.     Melatonin 10 MG TABS Take 10 tablets by mouth at bedtime.     mirtazapine  (REMERON ) 30 MG tablet TAKE 1 TABLET BY MOUTH AT BEDTIME 90 tablet 3   nitroGLYCERIN  (NITROSTAT ) 0.4 MG SL tablet Place 1 tablet (0.4 mg total) under the tongue every 5 (five) minutes as needed for chest pain. 25 tablet 3   potassium chloride  (MICRO-K ) 10 MEQ CR  capsule Take 10 mEq by mouth 2 (two) times daily. Takes 10meq with every 10mg  torsemide      sertraline  (ZOLOFT ) 25 MG tablet Take 1 tablet by mouth once daily 90 tablet 3   torsemide  (DEMADEX ) 10 MG tablet Take 10 mg by mouth 2 (two) times daily.     valsartan  (DIOVAN ) 40 MG tablet Take 0.5 tablets (20 mg total) by mouth daily. (Patient taking differently: Take 20 mg by mouth 2 (two) times daily.)     No current facility-administered medications for this visit.    Allergies  Allergen Reactions   Codeine Nausea And Vomiting and Nausea Only  Levofloxacin Other (See Comments)    Mouth sores   Lisinopril Other (See Comments) and Cough    Cough?   Other Itching   Povidone-Iodine Other (See Comments), Rash and Dermatitis    Severe blistering and itchiness. Severe redness   Simvastatin Other (See Comments)    Myalgias   Latex Itching      Social History   Socioeconomic History   Marital status: Divorced    Spouse name: na   Number of children: 4   Years of education: 14   Highest education level: Associate degree: occupational, scientist, product/process development, or vocational program  Occupational History   Occupation: retired  Tobacco Use   Smoking status: Never   Smokeless tobacco: Never  Vaping Use   Vaping status: Never Used  Substance and Sexual Activity   Alcohol use: No    Alcohol/week: 0.0 standard drinks of alcohol   Drug use: No   Sexual activity: Never  Other Topics Concern   Not on file  Social History Narrative   Not on file   Social Drivers of Health   Financial Resource Strain: Low Risk  (07/31/2024)   Received from [facility]   Overall Financial Resource Strain (CARDIA)    Difficulty of Paying Living Expenses: Not hard at all  Food Insecurity: No Food Insecurity (07/31/2024)   [facility] from [facility]   Hunger Vital Sign    Within the past 12 months, you worried that your food would run out before you got the money to buy more.: Never  true    Within the past 12 months, the food you bought just didn't last and you didn't have money to get more.: Never true  Transportation Needs: No Transportation Needs (07/31/2024)   Received from [facility] - Transportation    In the past 12 months, has lack of transportation kept you from medical appointments or from getting medications?: No    Lack of Transportation (Non-Medical): No  Physical Activity: Inactive (07/31/2024)   Received from [facility]   Exercise Vital Sign    On average, how many days per week do you engage in moderate to strenuous exercise (like a brisk walk)?: 0 days    On average, how many minutes do you engage in exercise at this level?: 0 min  Stress: No Stress Concern Present (08/18/2020)   [employer] - Occupational Stress Questionnaire    Feeling of Stress : Not at all  Social Connections: Socially Isolated (05/08/2024)   Social Connection and Isolation Panel    Frequency of Communication with Friends and Family: Never    Frequency of Social Gatherings with Friends and Family: More than three times a week    Attends Religious Services: Never    Database Administrator or Organizations: No    Attends Banker Meetings: Never    Marital Status: Divorced  Catering Manager Violence: Not At Risk (05/08/2024)   Humiliation, Afraid, Rape, and Kick questionnaire    Fear of Current or Ex-Partner: No    Emotionally Abused: No    Physically Abused: No    Sexually Abused: No      Family History  Problem Relation Age of Onset   Heart disease Mother    Hypertension Mother    Stroke Mother    Heart disease Father    Breast cancer Sister    Alzheimer's disease Brother    Heart attack Brother  Stroke Brother    Heart disease Brother    Mental illness Brother        died suicide   Cancer Brother        prostate   Heart disease Brother        atrial fib   Alzheimer's  disease Brother    Clotting disorder Daughter    Fibromyalgia Daughter    There were no vitals filed for this visit.  Wt Readings from Last 3 Encounters:  08/28/24 142 lb (64.4 kg)  08/27/24 144 lb 2 oz (65.4 kg)  08/22/24 144 lb 6.4 oz (65.5 kg)   Lab Results  Component Value Date   CREATININE 2.16 (H) 08/31/2024   CREATININE 1.50 (H) 08/16/2024   CREATININE 1.39 (H) 08/15/2024     PHYSICAL EXAM:  General: Well appearing elderly female.  Cor: No JVD. Regular rhythm, bradycardic.  Lungs: clear Abdomen: soft, nontender, nondistended. Extremities: 1+ pitting edema bilateral lower legs Neuro:. Affect pleasant  ReDs reading: 45 %, abnormal   ECG: not done   ASSESSMENT & PLAN:  1: ICM with mildly reduced ejection fraction- - NYHA class III - fluid up with worsening symptoms and elevated ReDs reading of 45% - discussed sending for outpatient IV Lasix  vs doubling current diuretic. She declines IV lasix  today as she doesn't want to go anywhere else today. Will double torsemide  to 20mg  BID/ potassium 20meq BID X 1 week until seen again in [facility] - discussed palliative care but patient declines that referral at this time. Will revisit this at future visits.  - Echo 09/18/2019: EF 40 to 45% - Echo 03/07/21: EF 50-55%, normal RV, moderate LVH - Echo 10/07/21: EF 40% - Echo 07/29/23: EF 55 to 60% - Echo 04/09/24: EF 45-50% with moderate LVH, G1DD, normal RV.  - weight down 7 pounds from last visit here 6 weeks ago - continue carvedilol  3.125mg  BID - continue valsartan  20mg  BID. When she took 40mg  at one time, she developed HA / diarrhea - unable to tolerate jardiance  due to UTI's - BMET next week - Explained the importance of keeping her BP under control yet managing her fluid retention & kidney function - Wearing compression socks daily - BNP 05/07/24 was 585.4  2: HTN- - BP 125/47 - continue  isosorbide  MN, valsartan  - saw PCP Garnet) 11/25 - CMP 08/23/24 reviewed: sodium 144,  potassium 4.1, creatinine 1.8 & GFR 26  3: CAD- - saw cardiology Reggy) 11/25 - continue plavix  75mg  daily - NSTEMI 2016 - NSTEMI 06/2019 (PCI/DES to the LAD), - R/L heart cath 06/07/2019 showed mid LAD 95% stenosis s/p successful PCI/DES, D2 30% stenosis. RHC showed mean RA pressure of 8, RV pressure 32/7 with a mean of 9, PA pressure 31/16 with a mean of 21, PCWP 10, CO/CI 3.57/2.02.   4: Anemia- - Hg 08/23/24 was 12.2 - continue oral iron  5: Hyperlipidemia- - continue atorvastatin  40mg  daily - LDL 07/31/24 was 51   Urine was checked at PCP office last week but they hadn't heard whether they should begin antibiotics or not. She had taken 1 days worth right before her recent admission. Since mental status is clear, will have them hold off on resuming antibiotics. Kidney function is not great and since we are increasing torsemide  today due to being fluid overloaded, want to not stress the kidneys any more than possible. Should her daughter start to notice any mental status changes, they will begin antibiotic at that time.    Return in  1 week, sooner if needed.   I spent 39 minutes reviewing records, interviewing/ examing patient and managing plan/ orders.   Ellouise DELENA Class, FNP-C 09/02/24

## 2024-09-03 ENCOUNTER — Ambulatory Visit: Admitting: Family

## 2024-10-02 ENCOUNTER — Telehealth: Payer: Self-pay | Admitting: Family

## 2024-10-02 NOTE — Telephone Encounter (Signed)
 Called to confirm/remind patient of their appointment at the [facility] on 10/03/24.   Appointment:   [x] Confirmed  [] Left mess   [] No answer/No voice mail  [] VM Full/unable to leave message  [] Phone not in service  Patient reminded to bring all medications and/or complete list.  Confirmed patient has transportation. Gave directions, instructed to utilize valet parking.

## 2024-10-03 ENCOUNTER — Encounter: Payer: Self-pay | Admitting: Family

## 2024-10-03 ENCOUNTER — Ambulatory Visit: Attending: Family | Admitting: Family

## 2024-10-03 VITALS — BP 146/66 | HR 58 | Wt 147.6 lb

## 2024-10-03 DIAGNOSIS — N390 Urinary tract infection, site not specified: Secondary | ICD-10-CM | POA: Insufficient documentation

## 2024-10-03 DIAGNOSIS — N182 Chronic kidney disease, stage 2 (mild): Secondary | ICD-10-CM | POA: Diagnosis not present

## 2024-10-03 DIAGNOSIS — Z79899 Other long term (current) drug therapy: Secondary | ICD-10-CM | POA: Diagnosis not present

## 2024-10-03 DIAGNOSIS — Z87440 Personal history of urinary (tract) infections: Secondary | ICD-10-CM | POA: Insufficient documentation

## 2024-10-03 DIAGNOSIS — D649 Anemia, unspecified: Secondary | ICD-10-CM | POA: Insufficient documentation

## 2024-10-03 DIAGNOSIS — F419 Anxiety disorder, unspecified: Secondary | ICD-10-CM | POA: Insufficient documentation

## 2024-10-03 DIAGNOSIS — D500 Iron deficiency anemia secondary to blood loss (chronic): Secondary | ICD-10-CM

## 2024-10-03 DIAGNOSIS — I509 Heart failure, unspecified: Secondary | ICD-10-CM | POA: Diagnosis not present

## 2024-10-03 DIAGNOSIS — Z7982 Long term (current) use of aspirin: Secondary | ICD-10-CM | POA: Diagnosis not present

## 2024-10-03 DIAGNOSIS — Z974 Presence of external hearing-aid: Secondary | ICD-10-CM | POA: Diagnosis not present

## 2024-10-03 DIAGNOSIS — Z7902 Long term (current) use of antithrombotics/antiplatelets: Secondary | ICD-10-CM | POA: Diagnosis not present

## 2024-10-03 DIAGNOSIS — I252 Old myocardial infarction: Secondary | ICD-10-CM | POA: Insufficient documentation

## 2024-10-03 DIAGNOSIS — I10 Essential (primary) hypertension: Secondary | ICD-10-CM | POA: Diagnosis not present

## 2024-10-03 DIAGNOSIS — Z8673 Personal history of transient ischemic attack (TIA), and cerebral infarction without residual deficits: Secondary | ICD-10-CM | POA: Diagnosis not present

## 2024-10-03 DIAGNOSIS — E782 Mixed hyperlipidemia: Secondary | ICD-10-CM | POA: Diagnosis not present

## 2024-10-03 DIAGNOSIS — E785 Hyperlipidemia, unspecified: Secondary | ICD-10-CM | POA: Diagnosis not present

## 2024-10-03 DIAGNOSIS — Z9861 Coronary angioplasty status: Secondary | ICD-10-CM | POA: Diagnosis not present

## 2024-10-03 DIAGNOSIS — Z604 Social exclusion and rejection: Secondary | ICD-10-CM | POA: Diagnosis not present

## 2024-10-03 DIAGNOSIS — N644 Mastodynia: Secondary | ICD-10-CM | POA: Diagnosis not present

## 2024-10-03 DIAGNOSIS — Z79890 Hormone replacement therapy (postmenopausal): Secondary | ICD-10-CM | POA: Diagnosis not present

## 2024-10-03 DIAGNOSIS — Z955 Presence of coronary angioplasty implant and graft: Secondary | ICD-10-CM | POA: Insufficient documentation

## 2024-10-03 DIAGNOSIS — M419 Scoliosis, unspecified: Secondary | ICD-10-CM | POA: Insufficient documentation

## 2024-10-03 DIAGNOSIS — R7989 Other specified abnormal findings of blood chemistry: Secondary | ICD-10-CM | POA: Insufficient documentation

## 2024-10-03 DIAGNOSIS — I5020 Unspecified systolic (congestive) heart failure: Secondary | ICD-10-CM | POA: Diagnosis not present

## 2024-10-03 DIAGNOSIS — I130 Hypertensive heart and chronic kidney disease with heart failure and stage 1 through stage 4 chronic kidney disease, or unspecified chronic kidney disease: Secondary | ICD-10-CM | POA: Diagnosis not present

## 2024-10-03 DIAGNOSIS — E039 Hypothyroidism, unspecified: Secondary | ICD-10-CM | POA: Insufficient documentation

## 2024-10-03 DIAGNOSIS — M40209 Unspecified kyphosis, site unspecified: Secondary | ICD-10-CM | POA: Diagnosis present

## 2024-10-03 DIAGNOSIS — I2510 Atherosclerotic heart disease of native coronary artery without angina pectoris: Secondary | ICD-10-CM | POA: Insufficient documentation

## 2024-10-03 NOTE — Progress Notes (Signed)
"   ReDS Vest / Clip - 10/03/24 1500       ReDS Vest / Clip   Station Marker B    Ruler Value 28    ReDS Value Range High volume overload    ReDS Actual Value 48          "

## 2024-10-03 NOTE — Patient Instructions (Signed)
 Take 1 torsemide  every day. If you gain more than 2 pounds overnight or your swelling worsens, you can take a 2nd tablet. If you have to take 2 tablets, you can take both of them at the same time.

## 2024-10-03 NOTE — Progress Notes (Signed)
 "  Advanced Heart Failure Clinic Note   Referring Physician: 07/25 admission PCP: Bertrum Charlie CROME, MD Cardiologist: Evalene Lunger, MD   Chief Complaint: fatigue   HPI:  Stephanie Charles is a 89 y/o female with a history of LBBB, TIA/CVA in 2016, CKD stage II, HTN, HLD, anemia, carotid artery disease, NSTEMI 06/2019 (PCI/DES to the LAD), NSTEMI 2016, hypothyroidism, PAD, anxiety, depression and chronic heart failure.    Admitted to [facility] in 06/2019 with NSTEMI and HTN. Echo 06/06/2019 showed LVEF 35-40%. Frequenct PVCs noted. R/L heart cath 06/07/2019 showed mid LAD 95% stenosis s/p successful PCI/DES, D2 30% stenosis. RHC showed mean RA pressure of 8, RV pressure 32/7 with a mean of 9, PA pressure 31/16 with a mean of 21, PCWP 10, CO/CI 3.57/2.02.   Admitted 07/2023 with chest pain that woke her up from sleep with elevated blood pressure.  She was treated with IV heparin  x 48 hours.  Echo showed EF of 55 to 60%.  She was restarted on Entresto .  She was felt to be a poor cardiac cath candidate. VQ scan showed no PE.   Went to the ER 01/06/24 for HTN and nausea. At home BP was 213/102. BP in the ER 172/80. HS trop 69>67.Labs were stable. CXR and CT head were reassuring. She was discharged home.   Admitted 04/08/24 with chest pain and shortness of breath. Chest x-ray showed acute pulmonary edema. BNP 359, troponin 468. She is diagnosed with acute congestive heart failure, started on IV Lasix  daily, but this had to be held due to worsening renal function. Echo 04/09/24: EF 45-50% with moderate LVH, G1DD, normal RV. Weaned off oxygen . Entresto  / diuretics held at discharge due to renal function. Losartan  was added.   Admitted 05/07/24 with shortness of breath and bilateral pedal edema due to HF exacerbation initially needing bipap. On antibiotics due to UTI. IV diuresed and then developed AKI. Given albumin , lasix  held and renal function improved. Elevated troponin thought to be due to demand ischemia.  Admitted  07/19/24 with worsening SOB. HTN with sats in the 70's. CXR showed pulmonary congestion. IV diuresed.   Was in the ED 08/15/24 with SOB and chest pain. Placed on 3L with sats rising into the 90's. Elevated troponin thought to be due to demand ischemia. IV diuresed and torsemide  changed to daily usage. ARB held. Jardiance  stopped due to frequent UTI's.  Seen in [facility] 08/27/24 where diuretic was doubled to torsemide  to 20mg  BID/ potassium 20meq BID.   Received 80mg  IV Lasix  on 08/31/24.   She presents today, with her daughter, for a HF follow-up visit with a chief complaint of minimal fatigue. Rare shortness of breath. Has occasional pain on axialla side of left breast. Has pedal edema which improves when wearing compression socks. Says that she normally takes torsemide  10mg  once daily because she was concerned that 2 daily was hurting her kidneys. Denies chest pain, palpitations, dizziness or difficulty sleeping. Does admit that getting ready for bed wears her out.   Previous cardiac studies: Echo 09/18/2019: EF 40 to 45% Echo 03/07/21: EF 50-55%, normal RV, moderate LVH Echo 10/07/21: EF 40% Echo 07/29/23: EF 55 to 60% Echo 04/09/24: EF 45-50% with moderate LVH, G1DD, normal RV  ROS: All systems negative except what is listed in HPI, PMH and Problem List  Past Medical History:  Diagnosis Date   Anemia    Anxiety    Aortic atherosclerosis    C. difficile diarrhea    CAD (coronary artery disease)  Carotid artery disease    CHF (congestive heart failure) (HCC) 10/05/2021   Chronic kidney disease, stage 3a (HCC)    CKD (chronic kidney disease), stage III (HCC)    Depression    Diverticulitis    First degree AV block    GERD (gastroesophageal reflux disease)    Grade I diastolic dysfunction 04/09/2024   H/O heart artery stent    Coronary disease, stent placed to proximal to mid LAD   History of cardiomegaly    Hyperlipidemia    Hypertension    Hypothyroidism    LBBB (left bundle branch  block)    Myocardial infarction (HCC) 08/2017   NSTEMI (non-ST elevated myocardial infarction) (HCC) 2016   NSTEMI (non-ST elevated myocardial infarction) (HCC) 2020   Pulmonary edema 08/15/2024   the day after cataract surgery   Syncope    TIA (transient ischemic attack)    1 approx 2015, 1 approx 2018   Vertigo    last episode several months ago   [name]     Current Outpatient Medications  Medication Sig Dispense Refill   albuterol  (PROVENTIL ) (2.5 MG/3ML) 0.083% nebulizer solution USE 1 VIAL IN NEBULIZER EVERY 4 HOURS AS NEEDED 180 mL 6   allopurinol  (ZYLOPRIM ) 100 MG tablet Take 1 tablet (100 mg total) by mouth daily. 30 tablet 0   amitriptyline  (ELAVIL ) 10 MG tablet Take 1 tablet (10 mg total) by mouth at bedtime. 90 tablet 3   ASPIRIN  LOW DOSE 81 MG EC tablet NEW PRESCRIPTION REQUEST: TAKE ONE TABLET BY MOUTH EVERY DAY 90 tablet 3   atorvastatin  (LIPITOR) 40 MG tablet Take 1 tablet by mouth once daily 90 tablet 3   carvedilol  (COREG ) 3.125 MG tablet Take 1 tablet (3.125 mg total) by mouth 2 (two) times daily with a meal. 180 tablet 3   clopidogrel  (PLAVIX ) 75 MG tablet Take 1 tablet by mouth once daily 90 tablet 3   cyclobenzaprine  (FLEXERIL ) 5 MG tablet Take 5 mg by mouth 3 (three) times daily as needed.     ferrous sulfate  325 (65 FE) MG tablet Take 325 mg by mouth at bedtime.     fluorometholone  (FML) 0.1 % ophthalmic suspension Place 1 drop into both eyes 2 (two) times daily.     gabapentin  (NEURONTIN ) 100 MG capsule Take 1 capsule by mouth at bedtime.     isosorbide  mononitrate (IMDUR ) 30 MG 24 hr tablet Take 1 tablet (30 mg total) by mouth daily. 30 tablet 0   levothyroxine  (SYNTHROID ) 125 MCG tablet Take 125 mcg by mouth every morning.     Melatonin 10 MG TABS Take 10 tablets by mouth at bedtime.     mirtazapine  (REMERON ) 30 MG tablet TAKE 1 TABLET BY MOUTH AT BEDTIME 90 tablet 3   nitroGLYCERIN  (NITROSTAT ) 0.4 MG SL tablet Place 1 tablet (0.4 mg  total) under the tongue every 5 (five) minutes as needed for chest pain. 25 tablet 3   potassium chloride  (MICRO-K ) 10 MEQ CR capsule Take 10 mEq by mouth 2 (two) times daily. Takes 10meq with every 10mg  torsemide      sertraline  (ZOLOFT ) 25 MG tablet Take 1 tablet by mouth once daily 90 tablet 3   torsemide  (DEMADEX ) 10 MG tablet Take 10 mg by mouth 2 (two) times daily.     valsartan  (DIOVAN ) 40 MG tablet Take 0.5 tablets (20 mg total) by mouth daily. (Patient taking differently: Take 20 mg by mouth 2 (two) times daily.)  No current facility-administered medications for this visit.    Allergies  Allergen Reactions   Codeine Nausea And Vomiting and Nausea Only   Levofloxacin Other (See Comments)    Mouth sores   Lisinopril Other (See Comments) and Cough    Cough?   Other Itching   Povidone-Iodine Other (See Comments), Rash and Dermatitis    Severe blistering and itchiness. Severe redness   Simvastatin Other (See Comments)    Myalgias   Latex Itching      Social History   Socioeconomic History   Marital status: Divorced    Spouse name: na   Number of children: 4   Years of education: 14   Highest education level: Associate degree: occupational, scientist, product/process development, or vocational program  Occupational History   Occupation: retired  Tobacco Use   Smoking status: Never   Smokeless tobacco: Never  Vaping Use   Vaping status: Never Used  Substance and Sexual Activity   Alcohol use: No    Alcohol/week: 0.0 standard drinks of alcohol   Drug use: No   Sexual activity: Never  Other Topics Concern   Not on file  Social History Narrative   Not on file   Social Drivers of Health   Tobacco Use: Low Risk (08/27/2024)   Patient History    Smoking Tobacco Use: Never    Smokeless Tobacco Use: Never    Passive Exposure: Not on file  Financial Resource Strain: Low Risk  (07/31/2024)   Received from [facility]   Overall Financial Resource Strain (CARDIA)     Difficulty of Paying Living Expenses: Not hard at all  Food Insecurity: No Food Insecurity (07/31/2024)   Received from [facility]   Epic    Within the past 12 months, you worried that your food would run out before you got the money to buy more.: Never true    Within the past 12 months, the food you bought just didn't last and you didn't have money to get more.: Never true  Transportation Needs: No Transportation Needs (07/31/2024)   Received from [facility] - Transportation    In the past 12 months, has lack of transportation kept you from medical appointments or from getting medications?: No    Lack of Transportation (Non-Medical): No  Physical Activity: Inactive (07/31/2024)   Received from [facility]   Exercise Vital Sign    On average, how many days per week do you engage in moderate to strenuous exercise (like a brisk walk)?: 0 days    On average, how many minutes do you engage in exercise at this level?: 0 min  Stress: Not on file  Social Connections: Socially Isolated (05/08/2024)   Social Connection and Isolation Panel    Frequency of Communication with Friends and Family: Never    Frequency of Social Gatherings with Friends and Family: More than three times a week    Attends Religious Services: Never    Database Administrator or Organizations: No    Attends Banker Meetings: Never    Marital Status: Divorced  Catering Manager Violence: Not At Risk (05/08/2024)   Epic    Fear of Current or Ex-Partner: No    Emotionally Abused: No    Physically Abused: No    Sexually Abused: No  Depression (PHQ2-9): Low Risk (06/03/2022)   Depression (PHQ2-9)    PHQ-2 Score: 1  Alcohol Screen: Low Risk (06/03/2022)   Alcohol Screen  Last Alcohol Screening Score (AUDIT): 0  Housing: Low Risk  (07/31/2024)   Received from [facility]   Epic    In the last 12 months, was there a time when you  were not able to pay the mortgage or rent on time?: No    In the past 12 months, how many times have you moved where you were living?: 0    At any time in the past 12 months, were you homeless or living in a shelter (including now)?: No  Utilities: Not At Risk (07/31/2024)   Received from [facility]   Epic    In the past 12 months has the electric, gas, oil, or water company threatened to shut off services in your home?: No  Health Literacy: Not on file      Family History  Problem Relation Age of Onset   Heart disease Mother    Hypertension Mother    Stroke Mother    Heart disease Father    Breast cancer Sister    Alzheimer's disease Brother    Heart attack Brother    Stroke Brother    Heart disease Brother    Mental illness Brother        died suicide   Cancer Brother        prostate   Heart disease Brother        atrial fib   Alzheimer's disease Brother    Clotting disorder Daughter    Fibromyalgia Daughter    Vitals:   10/03/24 1525  BP: (!) 146/66  Pulse: (!) 58  SpO2: 96%  Weight: 147 lb 9.6 oz (67 kg)   Wt Readings from Last 3 Encounters:  10/03/24 147 lb 9.6 oz (67 kg)  08/28/24 142 lb (64.4 kg)  08/27/24 144 lb 2 oz (65.4 kg)   Lab Results  Component Value Date   CREATININE 2.16 (H) 08/31/2024   CREATININE 1.50 (H) 08/16/2024   CREATININE 1.39 (H) 08/15/2024   PHYSICAL EXAM:  General: Well appearing elderly female in a wheelchair.  Cor: No JVD. Regular rhythm, bradycardic.  Lungs: clear Abdomen: soft, nontender, nondistended. Extremities: trace pitting edema in bilateral lower legs. Compression socks are on.  Neuro:. Affect pleasant   ECG: not done  ReDs reading: 48 %, abnormal     ASSESSMENT & PLAN:  1: ICM with mildly reduced ejection fraction- - NYHA class III - Reds is 48% although appears euvolemic. Patient has kyphosis/ scoliosis so question accuracy of ReDs reading.  - discussed palliative care but patient  declines that referral at this time. Will revisit this at future visits as the need arises.  - Echo 09/18/2019: EF 40 to 45% - Echo 03/07/21: EF 50-55%, normal RV, moderate LVH - Echo 10/07/21: EF 40% - Echo 07/29/23: EF 55 to 60% - Echo 04/09/24: EF 45-50% with moderate LVH, G1DD, normal RV.  - weight up 3 pounds from last visit here 5 weeks ago - continue carvedilol  3.125mg  BID - continue valsartan  20mg  BID. When she took 40mg  at one time, she developed HA / diarrhea - continue torsemide  10mg  daily with additional 10mg  as needed for weight gain, edema, SOB. If needs the 2nd dose, she can take them together instead of splitting them up - wanted to get BMET / proBNP today but she sees PCP in 2 days so will reach out to him to see if he will draw it as she doesn't want to go to the hospital for labs -  unable to tolerate jardiance  due to UTI's - Wearing compression socks daily - proBNP 08/31/24 was 4757.0  2: HTN- - BP 146/66 - continue  isosorbide  MN, valsartan  - saw PCP Garnet) 11/25 - BMP 08/31/24 reviewed: sodium 142, potassium 5.0, creatinine 2.16 & GFR 21  3: CAD- - saw cardiology Reggy) 11/25 - continue plavix  75mg  daily - NSTEMI 2016 - NSTEMI 06/2019 (PCI/DES to the LAD), - R/L heart cath 06/07/2019 showed mid LAD 95% stenosis s/p successful PCI/DES, D2 30% stenosis. RHC showed mean RA pressure of 8, RV pressure 32/7 with a mean of 9, PA pressure 31/16 with a mean of 21, PCWP 10, CO/CI 3.57/2.02.   4: Anemia- - Hg 08/23/24 was 12.2 - continue oral iron  5: Hyperlipidemia- - continue atorvastatin  40mg  daily - LDL 07/31/24 was 51    Return in 3 months, sooner if needed.   I spent 35 minutes reviewing records, interviewing/ examing patient and managing plan/ orders.    Ellouise DELENA Class, FNP-C 10/03/2024   "

## 2024-10-05 ENCOUNTER — Other Ambulatory Visit: Payer: Self-pay | Admitting: Family Medicine

## 2024-10-05 DIAGNOSIS — E2839 Other primary ovarian failure: Secondary | ICD-10-CM

## 2024-10-05 NOTE — Progress Notes (Signed)
 [facility] - [facility]  Chief complaint: No chief complaint on file.  F/u CHF. Subjective: Stephanie Charles is a 89 y.o. female here for f/u. History of Present Illness Stephanie Charles is a 89 year old female with heart failure who presents for follow-up regarding her medication management and kidney function.  She has been experiencing persistent aches and pains, particularly in her shoulder and hip, since a fall in the shower several months ago. The hip pain is constant but does not prevent her from walking. She mentions difficulty walking due to these discomforts but manages to walk despite them.  She has a history of heart failure and chronic kidney disease stage 4. She is currently taking torsemide . Her kidney function is being monitored closely, with recent lab work indicating her levels are around thirty.  She discusses a previous consultation where she was informed that injections were not an option for her back pain, and she expresses dissatisfaction with the care received from a back surgeon. She wants to know options and I told her they are limited due to health and age.  She also mentions using a device (Reds) to measure fluid retention, which recently gave a reading of forty-eight percent. However, she was informed that her spinal curvature might affect the accuracy of this measurement.    Current Outpatient Medications:    albuterol  (PROVENTIL ) 2.5 mg /3 mL (0.083 %) nebulizer solution, Take 3 mLs (2.5 mg total) by nebulization every 4 (four) hours as needed, Disp: 75 mL, Rfl: 2   allopurinoL  (ZYLOPRIM ) 100 MG tablet, Take 1 tablet (100 mg total) by mouth once daily, Disp: 90 tablet, Rfl: 1   amitriptyline  (ELAVIL ) 10 MG tablet, TAKE 2 TABLETS BY MOUTH AT BEDTIME, Disp: 180 tablet, Rfl: 1   aspirin  81 MG EC tablet, Take 1 tablet by mouth once daily, Disp: , Rfl:    atorvastatin  (LIPITOR) 40 MG tablet, Take 1 tablet by mouth once daily., Disp: , Rfl:    carvediloL  (COREG ) 3.125  MG tablet, Take 1 tablet (3.125 mg total) by mouth 2 (two) times daily with meals, Disp: 180 tablet, Rfl: 3   clopidogrel  (PLAVIX ) 75 mg tablet, Take 1 tablet by mouth once daily., Disp: , Rfl:    cyclobenzaprine  (FLEXERIL ) 5 MG tablet, Take 1 tablet by mouth three times daily as needed for muscle spasm, Disp: 30 tablet, Rfl: 0   ferrous sulfate  325 (65 FE) MG tablet, Take 325 mg by mouth at bedtime, Disp: , Rfl:    fluorometholone  (FML) 0.1 % ophthalmic suspension, Place 1 drop into both eyes 2 (two) times daily (Patient not taking: Reported on 08/22/2024), Disp: , Rfl:    gabapentin  (NEURONTIN ) 100 MG capsule, Take 1 capsule (100 mg total) by mouth at bedtime as needed (Patient taking differently: Take 100 mg by mouth at bedtime), Disp: 90 capsule, Rfl: 1   isosorbide  mononitrate (IMDUR ) 30 MG ER tablet, Take 1 tablet (30 mg total) by mouth once daily, Disp: 90 tablet, Rfl: 1   levothyroxine  (SYNTHROID ) 125 MCG tablet, Take 1 tablet (125 mcg total) by mouth once daily Take on an empty stomach with a glass of water at least 30-60 minutes before breakfast., Disp: 90 tablet, Rfl: 1   melatonin 10 mg Tab, Take 10 tablets by mouth at bedtime, Disp: , Rfl:    mirtazapine  (REMERON ) 30 MG tablet, TAKE 1 TABLET BY MOUTH AT BEDTIME, Disp: 90 tablet, Rfl: 1   nitroGLYcerin  (NITROSTAT ) 0.4 MG SL tablet, Place 1 tablet (0.4 mg  total) under the tongue every 5 (five) minutes as needed, Disp: 25 tablet, Rfl: 4   pantoprazole  (PROTONIX ) 40 MG DR tablet, Take 1 tablet (40 mg total) by mouth once daily (Patient not taking: Reported on 08/22/2024), Disp: 90 tablet, Rfl: 3   potassium chloride  (KLOR-CON ) 10 MEQ ER tablet, Take 10 mEq by mouth as needed, Disp: , Rfl:    sertraline  (ZOLOFT ) 25 MG tablet, Take 1 tablet by mouth once daily, Disp: 90 tablet, Rfl: 3   TORsemide  (DEMADEX ) 10 MG tablet, Take 1 tablet (10 mg total) by mouth 2 (two) times daily, Disp: 60 tablet, Rfl: 11   valsartan  (DIOVAN ) 40 MG  tablet, Take 40 mg by mouth once daily, Disp: , Rfl:   ROS reviewed: General ROS:  Negative for  - fatigue, fevers, chills HEENT ROS: negative for seasonal allergies, congestion, cough Respiratory ROS: negative for - cough, SOB, wheezing Cardiovascular ROS: no chest pain or dyspnea on exertion Gastrointestinal ROS: negative for constipation, N/V,D Genito-Urinary ROS: no dysuria, trouble voiding, or hematuria Musculoskeletal ROS: negative for joint pain, swelling Neurological ROS: negative for headaches, dizzinesss Dermatological ROS: negative for rash or skin lesion    Psychological ROS: negative for depression or anxiety  Allergies  Allergen Reactions   Other Itching   Latex Itching   Levofloxacin Other (See Comments)    Mouth sores   Lisinopril Unknown    Cough? Cough?    Povidone-Iodine Unknown and Other (See Comments)    Severe blistering and itchiness. Severe redness Severe blistering and itchiness. Severe redness    Simvastatin Other (See Comments)    Myalgias   Codeine Nausea And Vomiting    Social History   Tobacco Use   Smoking status: Never   Smokeless tobacco: Never  Vaping Use   Vaping status: Never Used  Substance Use Topics   Alcohol use: Never   Drug use: No      Objective:  There were no vitals taken for this visit. reviewed. Gen: AAOx3. Well-developed and well-nourished. NAD.  HEENT:    HEAD NORMOCEPHALIC.  PERRLA, EOM intact.  No thyromegaly present. No lymphadpathy. Cardiovascular: Normal rate, regular rhythm. Normal S1 and S2 without murmus, rubs or gallops.  Pulmonary/Chest: CTAB. Effort normal and breath sounds normal. No respiratory distress. No wheezes or rales. Abdomen: Soft. Bowel sounds are normal. No distension or tenderness.  Neuro: Cranial nervess II-XII intact.Nonfocal exam. Skin: Skin is warm and dry.  Musculoskeletal: Normal range of motion. No edema, no tenderness.  Psychiatric: Normal mood and affect. Her behavior is  normal. Judgment and thought content normal  Assessment and Plan: Assessment & Plan Congestive heart failure with chronic kidney disease stage 4 and coronary artery disease Chronic condition managed with torsemide . Kidney function stable, no recent hospitalizations. Patient prefers non-surgical interventions. - Continue torsemide . - Order lab work to monitor kidney function. - Schedule follow-up in three months. - Encourage follow-up with Dr. Richelle in late January or early February.  Benign essential hypertension Blood pressure well-controlled with current regimen. - Continue current antihypertensive medications.   Congestive heart failure, unspecified HF chronicity, unspecified heart failure type (CMS/HHS-HCC)  (primary encounter diagnosis) Plan: B-type natriuretic peptide (BNP), B-Type        Natriuretic Peptide - [facility], CANCELED: B-type       natriuretic peptide (BNP)  Chronic kidney disease, stage 4 (severe) (CMS/HHS-HCC) Plan: Renal Function Panel (RFP)  Benign essential HTN Plan: Renal Function Panel (RFP)  Pure hypercholesterolemia  Iron deficiency anemia, unspecified iron deficiency anemia  type  Hypothyroidism, unspecified type  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  Lumbar spondylosis      This note has been created using automated tools and reviewed for accuracy by RICHARD LESLIE GILBERT.

## 2024-10-07 NOTE — Progress Notes (Deleted)
 " Cardiology Office Note   Date:  10/07/2024  ID:  Stephanie Charles, DOB 02-Jun-1930, MRN [id number] PCP: Stephanie Charlie CROME, MD  [facility] Providers Cardiologist:  Stephanie Lunger, MD Cardiology APP:  Stephanie Frederick, NP { Click to update primary MD,subspecialty MD or APP then REFRESH:1}    History of Present Illness Stephanie Charles is a 89 y.o. female with a past medical history of coronary artery disease with prior NSTEMI (2016) and repeat (2020) status post PCI/DES to the LAD, HFpEF secondary to ischemic cardiomyopathy, labile hypertension, syncope felt to be vasovagal, TIA/posterior CVA, chronic left bundle branch block, carotid artery disease, hypothyroidism, diverticulosis without diverticulitis, history of C. difficile, CKD stage III, anemia, gout, anxiety/depression, who is being seen today for follow-up.   Stephanie Charles had previously followed with Stephanie Charles and transition to [facility] in 2020.  She was admitted to [facility] in 2020 with an NSTEMI and hypertensive urgency.  Echocardiogram revealed an LVEF 35 to 40%.  Frequent PVCs noted.  Right and left heart catheterization showed mid LAD 95% stenosis status post successful PCI/DES, D2 30% stenosis.  Right heart catheterization showed mean RA pressure of 8, RV pressure 32/7 with a mean of 9, PA pressure 31/16 with a mean of 21, PCWP 10, CO/CI 3.57/2.02.  Echo 09/2019 showed an LVEF of 40-45%, moderate LVH, G1 DD echo in 2022 showed an LVEF of 50-55%.  Repeat echo 10/2021 showed EF 40%.,  Moderate LVH, normal RV SF, frequent PVCs.  Outpatient cardiac monitor showed normal sinus rhythm with an average heart rate of 70 bpm, left bundle branch block, 1 run of nonsustained V. tach and 3.4% PVC burden.   She was previously admitted 07/2023 with chest pain woke up from sleep with elevated blood pressures.  She was treated with IV heparin  x 48 hours.  Echocardiogram showed an LVEF of 55 to 60%.  She was restarted on Entresto .  She has had been a poor cath  candidate.  VQ scan was completed and revealed no PE.  She was evaluated in clinic in February 2025 where amlodipine  was stopped for lower leg edema.  She was again reevaluated at [facility] emergency department in April 2025 with hypertension and nausea.  Blood pressure was 213/102.  Rechecked blood pressure in the emergency department was 172/80.  High-sensitivity troponin of 69 and 67.  Labs are stable.  Chest x-ray and CT of the head were reassuring.  She was considered stable and discharged home.  She was evaluated in clinic 01/12/24 where she was feeling okay.  Blood pressure been running high and she would have headaches and nausea.  She was continued on her current medication regimen with increasing her hydralazine  to 100 mg twice daily.  She was unable to have beta-blocker therapy due to baseline bradycardia and first-degree AV block.  Discussed noninvasive testing due to minimally elevated troponin in the setting of severe hypertension with recent hospitalization and she declined until her follow-up appointment with her primary cardiologist.  She was seen in clinic by Stephanie Charles 02/17/2024 that time continued to have shortness of breath on exertion.  She denies any chest pain concerning for angina.  No medication changes were made.  Given age and debility felt she was not a candidate for further invasive ischemic studies such as cardiac catheterization medical management was continued.  She presented to the [facility] emergency department on 04/08/2024 with complaints of shortness of breath muscle spasm and chest pain.  She was noted to be tachypneic  with respirations of 40 and oxygen  saturations were 85% on room air.  She was placed on 4 L of O2 via nasal cannula.  BNP was elevated at 359 troponin was 468.  She was diagnosed with acute congestive heart failure started on IV Lasix  daily that had to be held due to worsening renal function.  Entresto  was Parthenia continued due to worsening renal function.  Losartan   was added.  She was diuresed and was able to be discharged 04/12/2024.  She was again hospitalized at [facility] from 8//05/11/2024 with acute on chronic HFrEF.  She arrived by EMS from home with shortness of breath and bilateral lower extremity edema.  She was continued on antibiotics for UTI.  Upon arrival to the hospital she had significant hypoxia requiring BiPAP.  Recent echocardiogram revealed LVEF of 45 to 50% with evidence of congestive heart failure.  She was started on IV furosemide .  She developed acute renal failure with furosemide  and was given albumin .  Renal function was improving.  Lasix  continued to be placed on hold and she was considered medically stable for discharge.  She was hospitalized 10/16 - 07/21/2024 at [facility] for acute on chronic HFrEF.-She presented with worsening shortness of breath.  She had generalized weakness and exertional dyspnea.  She required 2 L of O2 via nasal cannula.  Chest x-ray revealed pulmonary congestion and she was diuresed.  Serum creatinine was 1.65 which was close to baseline.  Creatinine remained stable and she was feeling better not needing oxygen  and was ambulating and was considered stable for discharge home.  She was hospitalized again 11/20 - 11/13/125 for acute on chronic heart failure exacerbation.  She presented with shortness of breath and chest pain that began just prior to arrival.  On presentation she required 2 to 3 L of O2 via nasal cannula.  She was admitted for acute on chronic heart failure.  Hospital course per notes she was taking torsemide  10 mg alternating days based on weight.  ARB was held Jardiance  was discontinued due to recurrent UTIs.  After she was diuresed and she was considered stable and was discharged home.  She was seen in clinic 08/22/2024 with several questions about medication changes that were made during her recent hospitalization.  She also needed a preprocedure cardiovascular examination prior to cataract surgery.  She was  restarted on low-dose valsartan  due to elevated pressures as her Entresto  was discontinued during hospitalization due to bump in her serum creatinine.  She was post to have updated labs completed thereafter.  She was also encouraged to continue to maintain all follow-ups with advanced heart failure.   Received a call from anesthesia on 08/28/2024 as postop heart rate for patient was 41-44.  Blood pressure was stable and she denied any associated symptoms.  She was advised to hold her carvedilol  for tonight and to check her heart rate if her heart rate continued to remain below 55 to hold off on taking any carvedilol .  If it continue to remain low we will consider placing her on a ZIO XT monitor.  She returns to clinic today  ROS: 10 point review of systems has been reviewed and considered negative the exception was been listed in the HPI  Studies Reviewed      04/09/2024 Echo complete 1. Left ventricular ejection fraction, by estimation, is 45 to 50%. The  left ventricle has mildly decreased function. The left ventricle  demonstrates global hypokinesis. There is moderate concentric left  ventricular hypertrophy. Left ventricular  diastolic  parameters are consistent with Grade I diastolic dysfunction  (impaired relaxation).   2. Right ventricular systolic function is normal. The right ventricular  size is normal.   3. Left atrial size was mildly dilated.   4. Right atrial size was mildly dilated.   5. The mitral valve is grossly normal. Trivial mitral valve  regurgitation.   6. The aortic valve is normal in structure. Aortic valve regurgitation is  not visualized.    Echo 07/2023 1. Left ventricular ejection fraction, by estimation, is 55 to 60%. The  left ventricle has normal function. The left ventricle has no regional  wall motion abnormalities. There is mild left ventricular hypertrophy.  Left ventricular diastolic parameters  are indeterminate.   2. Right ventricular systolic  function is normal. The right ventricular  size is normal.   3. Left atrial size was moderately dilated.   4. The mitral valve is normal in structure. Mild mitral valve  regurgitation.   5. The aortic valve was not well visualized. Aortic valve regurgitation  is trivial.    Carotid US  12/2022  Summary:  Right Carotid: Velocities in the right ICA are consistent with a 1-39%  stenosis.                Non-hemodynamically significant plaque <50% noted in the  CCA. The                 ECA appears >50% stenosed. Tortuous CCA.   Left Carotid: Velocities in the left ICA are consistent with a 1-39%  stenosis.               Non-hemodynamically significant plaque <50% noted in the  CCA. The                ECA appears <50% stenosed.    R/L heart cath 2020 Prox LAD lesion is 90% stenosed. Prox Cx to Mid Cx lesion is 40% stenosed. Prox RCA lesion is 30% stenosed.  Risk Assessment/Calculations   No BP recorded.  {Refresh Note OR Click here to enter BP  :1}***       Physical Exam VS:  There were no vitals taken for this visit.       Wt Readings from Last 3 Encounters:  10/03/24 147 lb 9.6 oz (67 kg)  08/28/24 142 lb (64.4 kg)  08/27/24 144 lb 2 oz (65.4 kg)    GEN: Well nourished, well developed in no acute distress NECK: No JVD; No carotid bruits CARDIAC: ***RRR, no murmurs, rubs, gallops RESPIRATORY:  Clear to auscultation without rales, wheezing or rhonchi  ABDOMEN: Soft, non-tender, non-distended EXTREMITIES:  No edema; No deformity   ASSESSMENT AND PLAN Asymptomatic bradycardia Chronic HFmrEF CKD stage IIIa-IV Coronary artery disease Mixed hyperlipidemia Chronic left bundle branch block on EKG Carotid artery stenosis    {Are you ordering a CV Procedure (e.g. stress test, cath, DCCV, TEE, etc)?   Press F2        :[id number]}  Dispo: ***  Signed, Andrewjames Weirauch, NP   "

## 2024-10-09 ENCOUNTER — Ambulatory Visit: Admitting: Cardiology

## 2024-11-13 ENCOUNTER — Ambulatory Visit: Admitting: Cardiovascular Disease

## 2025-01-01 ENCOUNTER — Ambulatory Visit: Admitting: Family
